# Patient Record
Sex: Male | Born: 1960
Health system: Southern US, Community
[De-identification: ages and names within clinical notes are randomized; demographics above are authoritative.]

## PROBLEM LIST (undated history)

## (undated) DIAGNOSIS — M1990 Unspecified osteoarthritis, unspecified site: Secondary | ICD-10-CM

## (undated) DIAGNOSIS — Z8673 Personal history of transient ischemic attack (TIA), and cerebral infarction without residual deficits: Secondary | ICD-10-CM

## (undated) DIAGNOSIS — I10 Essential (primary) hypertension: Secondary | ICD-10-CM

## (undated) DIAGNOSIS — E669 Obesity, unspecified: Secondary | ICD-10-CM

## (undated) DIAGNOSIS — H269 Unspecified cataract: Secondary | ICD-10-CM

## (undated) DIAGNOSIS — K5790 Diverticulosis of intestine, part unspecified, without perforation or abscess without bleeding: Secondary | ICD-10-CM

## (undated) DIAGNOSIS — E785 Hyperlipidemia, unspecified: Secondary | ICD-10-CM

## (undated) DIAGNOSIS — I509 Heart failure, unspecified: Secondary | ICD-10-CM

## (undated) HISTORY — DX: Essential (primary) hypertension: I10

## (undated) HISTORY — DX: Heart failure, unspecified: I50.9

## (undated) HISTORY — DX: Diverticulosis of intestine, part unspecified, without perforation or abscess without bleeding: K57.90

## (undated) HISTORY — DX: Unspecified osteoarthritis, unspecified site: M19.90

## (undated) HISTORY — DX: Obesity, unspecified: E66.9

## (undated) HISTORY — DX: Unspecified cataract: H26.9

## (undated) HISTORY — DX: Personal history of transient ischemic attack (TIA), and cerebral infarction without residual deficits: Z86.73

## (undated) HISTORY — DX: Hyperlipidemia, unspecified: E78.5

---

## 1998-04-07 HISTORY — PX: KNEE SURGERY: SHX244

## 1998-05-19 ENCOUNTER — Emergency Department (HOSPITAL_COMMUNITY): Admission: EM | Admit: 1998-05-19 | Discharge: 1998-05-19 | Payer: Self-pay | Admitting: Emergency Medicine

## 1998-09-15 ENCOUNTER — Emergency Department (HOSPITAL_COMMUNITY): Admission: EM | Admit: 1998-09-15 | Discharge: 1998-09-15 | Payer: Self-pay

## 1999-01-12 ENCOUNTER — Emergency Department (HOSPITAL_COMMUNITY): Admission: EM | Admit: 1999-01-12 | Discharge: 1999-01-12 | Payer: Self-pay | Admitting: Emergency Medicine

## 1999-03-02 ENCOUNTER — Emergency Department (HOSPITAL_COMMUNITY): Admission: EM | Admit: 1999-03-02 | Discharge: 1999-03-03 | Payer: Self-pay | Admitting: *Deleted

## 1999-05-09 ENCOUNTER — Emergency Department (HOSPITAL_COMMUNITY): Admission: EM | Admit: 1999-05-09 | Discharge: 1999-05-09 | Payer: Self-pay | Admitting: Emergency Medicine

## 1999-06-15 ENCOUNTER — Emergency Department (HOSPITAL_COMMUNITY): Admission: EM | Admit: 1999-06-15 | Discharge: 1999-06-15 | Payer: Self-pay | Admitting: *Deleted

## 1999-12-04 ENCOUNTER — Encounter: Admission: RE | Admit: 1999-12-04 | Discharge: 1999-12-04 | Payer: Self-pay | Admitting: Nephrology

## 1999-12-04 ENCOUNTER — Encounter: Payer: Self-pay | Admitting: Nephrology

## 1999-12-18 ENCOUNTER — Encounter: Payer: Self-pay | Admitting: Specialist

## 1999-12-18 ENCOUNTER — Encounter: Admission: RE | Admit: 1999-12-18 | Discharge: 1999-12-18 | Payer: Self-pay | Admitting: Specialist

## 2000-05-14 ENCOUNTER — Emergency Department (HOSPITAL_COMMUNITY): Admission: EM | Admit: 2000-05-14 | Discharge: 2000-05-14 | Payer: Self-pay | Admitting: Emergency Medicine

## 2000-07-07 ENCOUNTER — Emergency Department (HOSPITAL_COMMUNITY): Admission: EM | Admit: 2000-07-07 | Discharge: 2000-07-07 | Payer: Self-pay | Admitting: Emergency Medicine

## 2000-07-29 ENCOUNTER — Emergency Department (HOSPITAL_COMMUNITY): Admission: EM | Admit: 2000-07-29 | Discharge: 2000-07-29 | Payer: Self-pay | Admitting: Internal Medicine

## 2000-08-28 ENCOUNTER — Emergency Department (HOSPITAL_COMMUNITY): Admission: EM | Admit: 2000-08-28 | Discharge: 2000-08-28 | Payer: Self-pay | Admitting: Emergency Medicine

## 2000-10-29 ENCOUNTER — Encounter: Payer: Self-pay | Admitting: Emergency Medicine

## 2000-10-29 ENCOUNTER — Inpatient Hospital Stay (HOSPITAL_COMMUNITY): Admission: AD | Admit: 2000-10-29 | Discharge: 2000-10-31 | Payer: Self-pay | Admitting: Nephrology

## 2000-10-31 ENCOUNTER — Encounter: Payer: Self-pay | Admitting: Nephrology

## 2003-01-27 ENCOUNTER — Encounter: Admission: RE | Admit: 2003-01-27 | Discharge: 2003-01-27 | Payer: Self-pay | Admitting: Nephrology

## 2003-01-27 ENCOUNTER — Encounter: Payer: Self-pay | Admitting: Nephrology

## 2003-04-09 ENCOUNTER — Emergency Department (HOSPITAL_COMMUNITY): Admission: AD | Admit: 2003-04-09 | Discharge: 2003-04-09 | Payer: Self-pay | Admitting: Family Medicine

## 2003-06-10 ENCOUNTER — Emergency Department (HOSPITAL_COMMUNITY): Admission: AD | Admit: 2003-06-10 | Discharge: 2003-06-10 | Payer: Self-pay | Admitting: Family Medicine

## 2003-07-21 ENCOUNTER — Encounter: Admission: RE | Admit: 2003-07-21 | Discharge: 2003-07-21 | Payer: Self-pay | Admitting: Nephrology

## 2003-10-19 ENCOUNTER — Observation Stay (HOSPITAL_COMMUNITY): Admission: EM | Admit: 2003-10-19 | Discharge: 2003-10-20 | Payer: Self-pay | Admitting: Emergency Medicine

## 2003-10-24 ENCOUNTER — Ambulatory Visit (HOSPITAL_COMMUNITY): Admission: RE | Admit: 2003-10-24 | Discharge: 2003-10-24 | Payer: Self-pay | Admitting: Cardiology

## 2003-10-24 HISTORY — PX: CARDIAC CATHETERIZATION: SHX172

## 2003-10-30 ENCOUNTER — Emergency Department (HOSPITAL_COMMUNITY): Admission: EM | Admit: 2003-10-30 | Discharge: 2003-10-30 | Payer: Self-pay | Admitting: Family Medicine

## 2003-10-30 ENCOUNTER — Emergency Department (HOSPITAL_COMMUNITY): Admission: EM | Admit: 2003-10-30 | Discharge: 2003-10-30 | Payer: Self-pay | Admitting: Emergency Medicine

## 2004-05-05 ENCOUNTER — Emergency Department (HOSPITAL_COMMUNITY): Admission: EM | Admit: 2004-05-05 | Discharge: 2004-05-05 | Payer: Self-pay | Admitting: Emergency Medicine

## 2004-05-15 ENCOUNTER — Emergency Department (HOSPITAL_COMMUNITY): Admission: EM | Admit: 2004-05-15 | Discharge: 2004-05-15 | Payer: Self-pay | Admitting: Emergency Medicine

## 2004-10-04 ENCOUNTER — Ambulatory Visit: Payer: Self-pay | Admitting: Internal Medicine

## 2004-10-29 ENCOUNTER — Inpatient Hospital Stay (HOSPITAL_COMMUNITY): Admission: EM | Admit: 2004-10-29 | Discharge: 2004-11-01 | Payer: Self-pay | Admitting: Emergency Medicine

## 2005-01-26 ENCOUNTER — Inpatient Hospital Stay (HOSPITAL_COMMUNITY): Admission: EM | Admit: 2005-01-26 | Discharge: 2005-01-30 | Payer: Self-pay | Admitting: Emergency Medicine

## 2005-01-27 ENCOUNTER — Encounter (INDEPENDENT_AMBULATORY_CARE_PROVIDER_SITE_OTHER): Payer: Self-pay | Admitting: Cardiovascular Disease

## 2005-01-30 ENCOUNTER — Ambulatory Visit: Payer: Self-pay | Admitting: Internal Medicine

## 2006-04-07 HISTORY — PX: CARDIAC DEFIBRILLATOR PLACEMENT: SHX171

## 2007-01-31 ENCOUNTER — Ambulatory Visit: Payer: Self-pay | Admitting: Internal Medicine

## 2007-01-31 ENCOUNTER — Inpatient Hospital Stay (HOSPITAL_COMMUNITY): Admission: EM | Admit: 2007-01-31 | Discharge: 2007-02-05 | Payer: Self-pay | Admitting: Emergency Medicine

## 2007-02-02 ENCOUNTER — Encounter (INDEPENDENT_AMBULATORY_CARE_PROVIDER_SITE_OTHER): Payer: Self-pay | Admitting: Cardiovascular Disease

## 2007-02-12 ENCOUNTER — Ambulatory Visit: Payer: Self-pay | Admitting: Family Medicine

## 2007-02-12 ENCOUNTER — Other Ambulatory Visit: Payer: Self-pay | Admitting: Internal Medicine

## 2007-02-12 ENCOUNTER — Inpatient Hospital Stay (HOSPITAL_COMMUNITY): Admission: EM | Admit: 2007-02-12 | Discharge: 2007-02-15 | Payer: Self-pay | Admitting: Emergency Medicine

## 2007-02-12 ENCOUNTER — Ambulatory Visit: Payer: Self-pay | Admitting: Internal Medicine

## 2007-02-18 ENCOUNTER — Ambulatory Visit: Payer: Self-pay

## 2007-02-18 ENCOUNTER — Encounter (INDEPENDENT_AMBULATORY_CARE_PROVIDER_SITE_OTHER): Payer: Self-pay | Admitting: Family Medicine

## 2007-02-18 DIAGNOSIS — N183 Chronic kidney disease, stage 3 unspecified: Secondary | ICD-10-CM

## 2007-02-18 DIAGNOSIS — N1831 Chronic kidney disease, stage 3a: Secondary | ICD-10-CM | POA: Insufficient documentation

## 2007-02-18 DIAGNOSIS — E875 Hyperkalemia: Secondary | ICD-10-CM | POA: Insufficient documentation

## 2007-02-18 DIAGNOSIS — I10 Essential (primary) hypertension: Secondary | ICD-10-CM

## 2007-02-18 DIAGNOSIS — I5022 Chronic systolic (congestive) heart failure: Secondary | ICD-10-CM

## 2007-02-18 HISTORY — DX: Chronic systolic (congestive) heart failure: I50.22

## 2007-02-18 HISTORY — DX: Essential (primary) hypertension: I10

## 2007-02-18 HISTORY — DX: Chronic kidney disease, stage 3 unspecified: N18.30

## 2007-02-18 LAB — CONVERTED CEMR LAB
BUN: 33 mg/dL — ABNORMAL HIGH (ref 6–23)
CO2: 23 meq/L (ref 19–32)
Calcium: 9.7 mg/dL (ref 8.4–10.5)
Chloride: 107 meq/L (ref 96–112)
Creatinine, Ser: 1.64 mg/dL — ABNORMAL HIGH (ref 0.40–1.50)
Glucose, Bld: 116 mg/dL — ABNORMAL HIGH (ref 70–99)
Potassium: 5 meq/L (ref 3.5–5.3)
Sodium: 143 meq/L (ref 135–145)

## 2007-02-19 ENCOUNTER — Ambulatory Visit: Payer: Self-pay | Admitting: Internal Medicine

## 2007-02-19 ENCOUNTER — Observation Stay (HOSPITAL_COMMUNITY): Admission: RE | Admit: 2007-02-19 | Discharge: 2007-02-20 | Payer: Self-pay | Admitting: Internal Medicine

## 2007-03-08 ENCOUNTER — Ambulatory Visit: Payer: Self-pay

## 2007-03-11 ENCOUNTER — Telehealth (INDEPENDENT_AMBULATORY_CARE_PROVIDER_SITE_OTHER): Payer: Self-pay | Admitting: Family Medicine

## 2007-03-29 ENCOUNTER — Telehealth: Payer: Self-pay | Admitting: *Deleted

## 2007-04-02 ENCOUNTER — Telehealth: Payer: Self-pay | Admitting: *Deleted

## 2007-04-14 ENCOUNTER — Ambulatory Visit: Payer: Self-pay | Admitting: Family Medicine

## 2007-04-14 LAB — CONVERTED CEMR LAB
ALT: 21 units/L (ref 0–53)
AST: 15 units/L (ref 0–37)
Albumin: 4.3 g/dL (ref 3.5–5.2)
Alkaline Phosphatase: 71 units/L (ref 39–117)
BUN: 15 mg/dL (ref 6–23)
CO2: 25 meq/L (ref 19–32)
Calcium: 9.1 mg/dL (ref 8.4–10.5)
Chloride: 107 meq/L (ref 96–112)
Cholesterol: 121 mg/dL (ref 0–200)
Creatinine, Ser: 1.18 mg/dL (ref 0.40–1.50)
Glucose, Bld: 117 mg/dL — ABNORMAL HIGH (ref 70–99)
HDL: 31 mg/dL — ABNORMAL LOW (ref >39)
LDL Cholesterol: 75 mg/dL (ref 0–99)
Potassium: 4.3 meq/L (ref 3.5–5.3)
Pro B Natriuretic peptide (BNP): 70 pg/mL (ref 0.0–100.0)
Sodium: 144 meq/L (ref 135–145)
Total Bilirubin: 0.8 mg/dL (ref 0.3–1.2)
Total CHOL/HDL Ratio: 3.9
Total Protein: 7.2 g/dL (ref 6.0–8.3)
Triglycerides: 76 mg/dL (ref <150)
VLDL: 15 mg/dL (ref 0–40)

## 2007-04-22 ENCOUNTER — Encounter: Payer: Self-pay | Admitting: Family Medicine

## 2007-04-22 ENCOUNTER — Telehealth: Payer: Self-pay | Admitting: *Deleted

## 2007-05-14 ENCOUNTER — Ambulatory Visit: Payer: Self-pay | Admitting: Internal Medicine

## 2007-06-16 ENCOUNTER — Ambulatory Visit: Payer: Self-pay | Admitting: Family Medicine

## 2007-06-16 LAB — CONVERTED CEMR LAB
ALT: 18 units/L (ref 0–53)
AST: 14 units/L (ref 0–37)
Albumin: 4.2 g/dL (ref 3.5–5.2)
Alkaline Phosphatase: 69 units/L (ref 39–117)
BUN: 14 mg/dL (ref 6–23)
CO2: 26 meq/L (ref 19–32)
Calcium: 9.1 mg/dL (ref 8.4–10.5)
Chloride: 110 meq/L (ref 96–112)
Creatinine, Ser: 1.04 mg/dL (ref 0.40–1.50)
Glucose, Bld: 122 mg/dL — ABNORMAL HIGH (ref 70–99)
Potassium: 4.5 meq/L (ref 3.5–5.3)
Pro B Natriuretic peptide (BNP): 45 pg/mL (ref 0.0–100.0)
Sodium: 144 meq/L (ref 135–145)
Total Bilirubin: 0.7 mg/dL (ref 0.3–1.2)
Total Protein: 7.2 g/dL (ref 6.0–8.3)

## 2007-06-17 ENCOUNTER — Encounter: Payer: Self-pay | Admitting: Family Medicine

## 2007-06-30 ENCOUNTER — Ambulatory Visit: Payer: Self-pay | Admitting: Family Medicine

## 2007-06-30 LAB — CONVERTED CEMR LAB
BUN: 13 mg/dL (ref 6–23)
CO2: 27 meq/L (ref 19–32)
Calcium: 9.1 mg/dL (ref 8.4–10.5)
Chloride: 106 meq/L (ref 96–112)
Creatinine, Ser: 1.13 mg/dL (ref 0.40–1.50)
Glucose, Bld: 125 mg/dL — ABNORMAL HIGH (ref 70–99)
Potassium: 4.1 meq/L (ref 3.5–5.3)
Sodium: 144 meq/L (ref 135–145)

## 2007-07-05 ENCOUNTER — Encounter: Payer: Self-pay | Admitting: Family Medicine

## 2007-08-12 ENCOUNTER — Ambulatory Visit: Payer: Self-pay

## 2007-08-25 ENCOUNTER — Ambulatory Visit: Payer: Self-pay | Admitting: Family Medicine

## 2007-08-25 DIAGNOSIS — E118 Type 2 diabetes mellitus with unspecified complications: Secondary | ICD-10-CM

## 2007-08-25 DIAGNOSIS — E1165 Type 2 diabetes mellitus with hyperglycemia: Secondary | ICD-10-CM

## 2007-08-25 DIAGNOSIS — N184 Chronic kidney disease, stage 4 (severe): Secondary | ICD-10-CM

## 2007-08-25 DIAGNOSIS — E1122 Type 2 diabetes mellitus with diabetic chronic kidney disease: Secondary | ICD-10-CM

## 2007-08-25 HISTORY — DX: Type 2 diabetes mellitus with diabetic chronic kidney disease: E11.22

## 2007-08-25 HISTORY — DX: Chronic kidney disease, stage 4 (severe): N18.4

## 2007-08-26 ENCOUNTER — Encounter: Payer: Self-pay | Admitting: Family Medicine

## 2007-09-13 ENCOUNTER — Telehealth: Payer: Self-pay | Admitting: *Deleted

## 2007-11-09 ENCOUNTER — Telehealth: Payer: Self-pay | Admitting: *Deleted

## 2007-11-11 ENCOUNTER — Ambulatory Visit: Payer: Self-pay

## 2007-11-17 ENCOUNTER — Ambulatory Visit: Payer: Self-pay | Admitting: Family Medicine

## 2007-12-10 ENCOUNTER — Encounter: Payer: Self-pay | Admitting: Family Medicine

## 2007-12-10 ENCOUNTER — Ambulatory Visit: Payer: Self-pay | Admitting: Family Medicine

## 2007-12-10 LAB — CONVERTED CEMR LAB
ALT: 14 units/L (ref 0–53)
AST: 13 units/L (ref 0–37)
Albumin: 4.3 g/dL (ref 3.5–5.2)
Alkaline Phosphatase: 66 units/L (ref 39–117)
BUN: 17 mg/dL (ref 6–23)
CO2: 26 meq/L (ref 19–32)
Calcium: 8.8 mg/dL (ref 8.4–10.5)
Chloride: 108 meq/L (ref 96–112)
Creatinine, Ser: 1.22 mg/dL (ref 0.40–1.50)
Glucose, Bld: 129 mg/dL — ABNORMAL HIGH (ref 70–99)
Hgb A1c MFr Bld: 6.6 %
Potassium: 3.9 meq/L (ref 3.5–5.3)
Sodium: 144 meq/L (ref 135–145)
Total Bilirubin: 0.5 mg/dL (ref 0.3–1.2)
Total Protein: 7.1 g/dL (ref 6.0–8.3)

## 2007-12-14 ENCOUNTER — Encounter: Payer: Self-pay | Admitting: Family Medicine

## 2008-01-10 ENCOUNTER — Ambulatory Visit: Payer: Self-pay | Admitting: Family Medicine

## 2008-01-12 ENCOUNTER — Telehealth (INDEPENDENT_AMBULATORY_CARE_PROVIDER_SITE_OTHER): Payer: Self-pay | Admitting: *Deleted

## 2008-01-20 ENCOUNTER — Telehealth: Payer: Self-pay | Admitting: *Deleted

## 2008-01-21 ENCOUNTER — Encounter: Admission: RE | Admit: 2008-01-21 | Discharge: 2008-01-21 | Payer: Self-pay | Admitting: Family Medicine

## 2008-01-21 ENCOUNTER — Ambulatory Visit: Payer: Self-pay | Admitting: Family Medicine

## 2008-02-08 ENCOUNTER — Ambulatory Visit: Payer: Self-pay | Admitting: Internal Medicine

## 2008-03-08 ENCOUNTER — Ambulatory Visit: Payer: Self-pay | Admitting: Family Medicine

## 2008-03-08 LAB — CONVERTED CEMR LAB
ALT: 13 units/L (ref 0–53)
AST: 12 units/L (ref 0–37)
Albumin: 4.4 g/dL (ref 3.5–5.2)
Alkaline Phosphatase: 68 units/L (ref 39–117)
BUN: 16 mg/dL (ref 6–23)
CO2: 28 meq/L (ref 19–32)
Calcium: 9.3 mg/dL (ref 8.4–10.5)
Chloride: 106 meq/L (ref 96–112)
Creatinine, Ser: 1.2 mg/dL (ref 0.40–1.50)
Glucose, Bld: 105 mg/dL — ABNORMAL HIGH (ref 70–99)
Hgb A1c MFr Bld: 6.6 %
Potassium: 4 meq/L (ref 3.5–5.3)
Sodium: 145 meq/L (ref 135–145)
Total Bilirubin: 0.6 mg/dL (ref 0.3–1.2)
Total Protein: 6.9 g/dL (ref 6.0–8.3)

## 2008-03-17 ENCOUNTER — Encounter: Payer: Self-pay | Admitting: Family Medicine

## 2008-05-10 ENCOUNTER — Encounter (INDEPENDENT_AMBULATORY_CARE_PROVIDER_SITE_OTHER): Payer: Self-pay | Admitting: *Deleted

## 2008-05-10 ENCOUNTER — Ambulatory Visit: Payer: Self-pay | Admitting: Internal Medicine

## 2008-05-30 IMAGING — CR DG CHEST 2V
3 series · 3 of 3 positions shown · non-contrast
Comparison: 02/12/07.

CLINICAL DATA: Cardiac arrhythmia and status-post insertion of pacemaker. 
 PORTABLE CHEST - 1 VIEW:

[w chest pa]
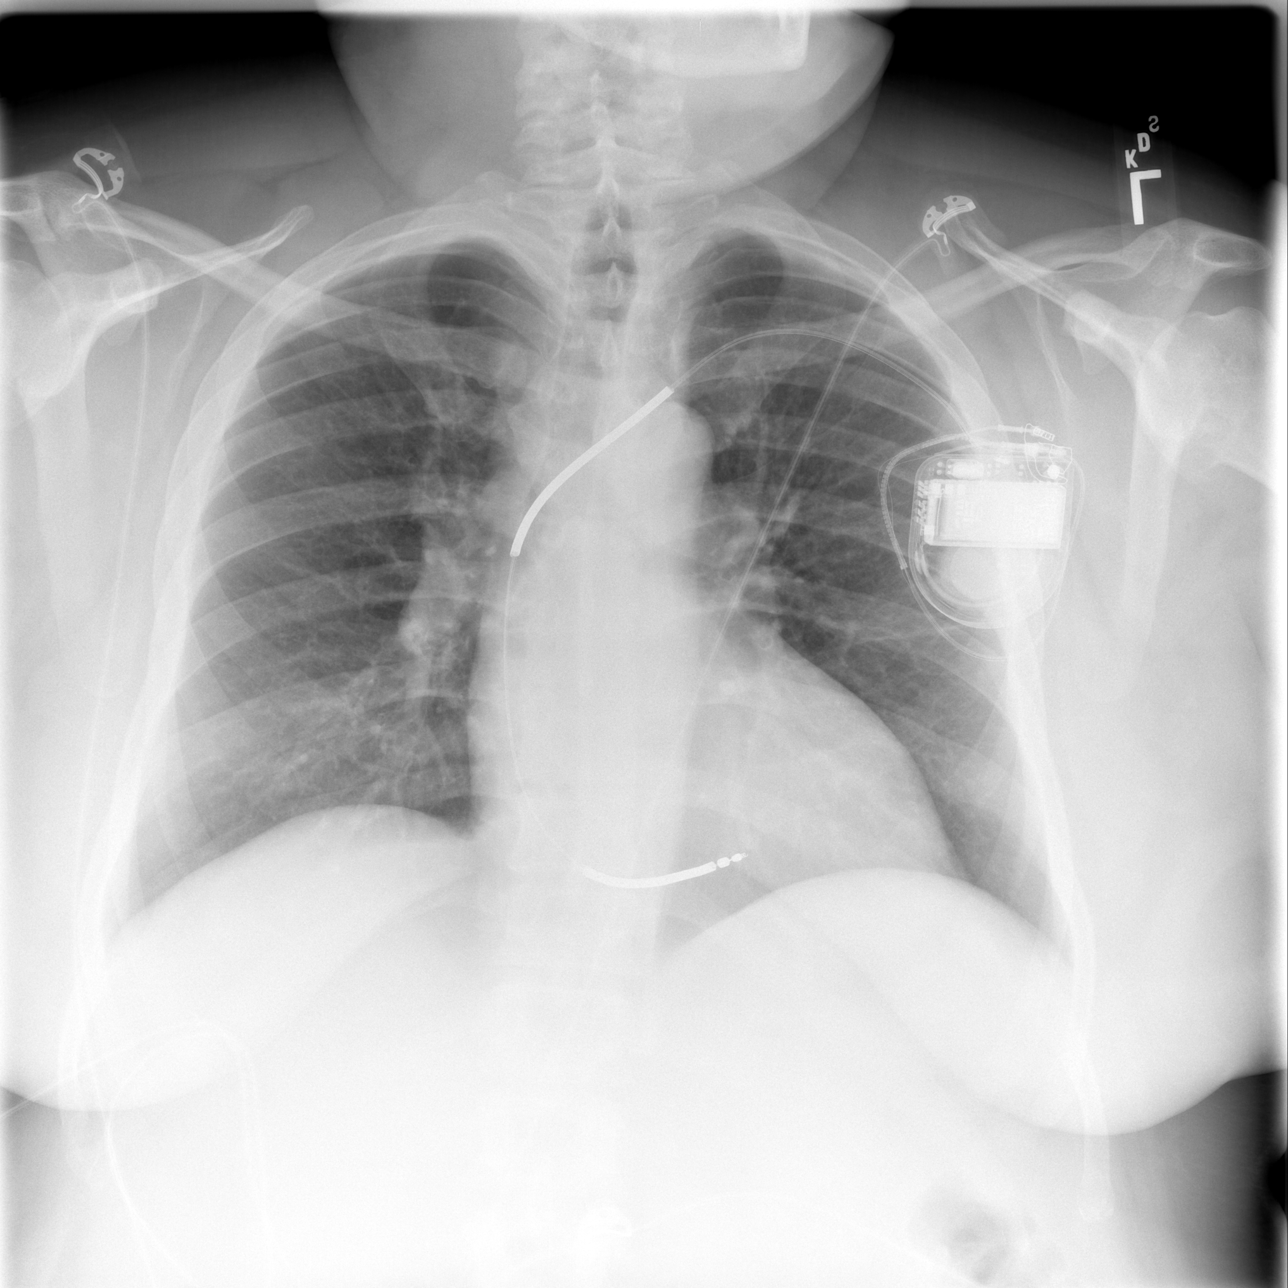

[w chest lat]
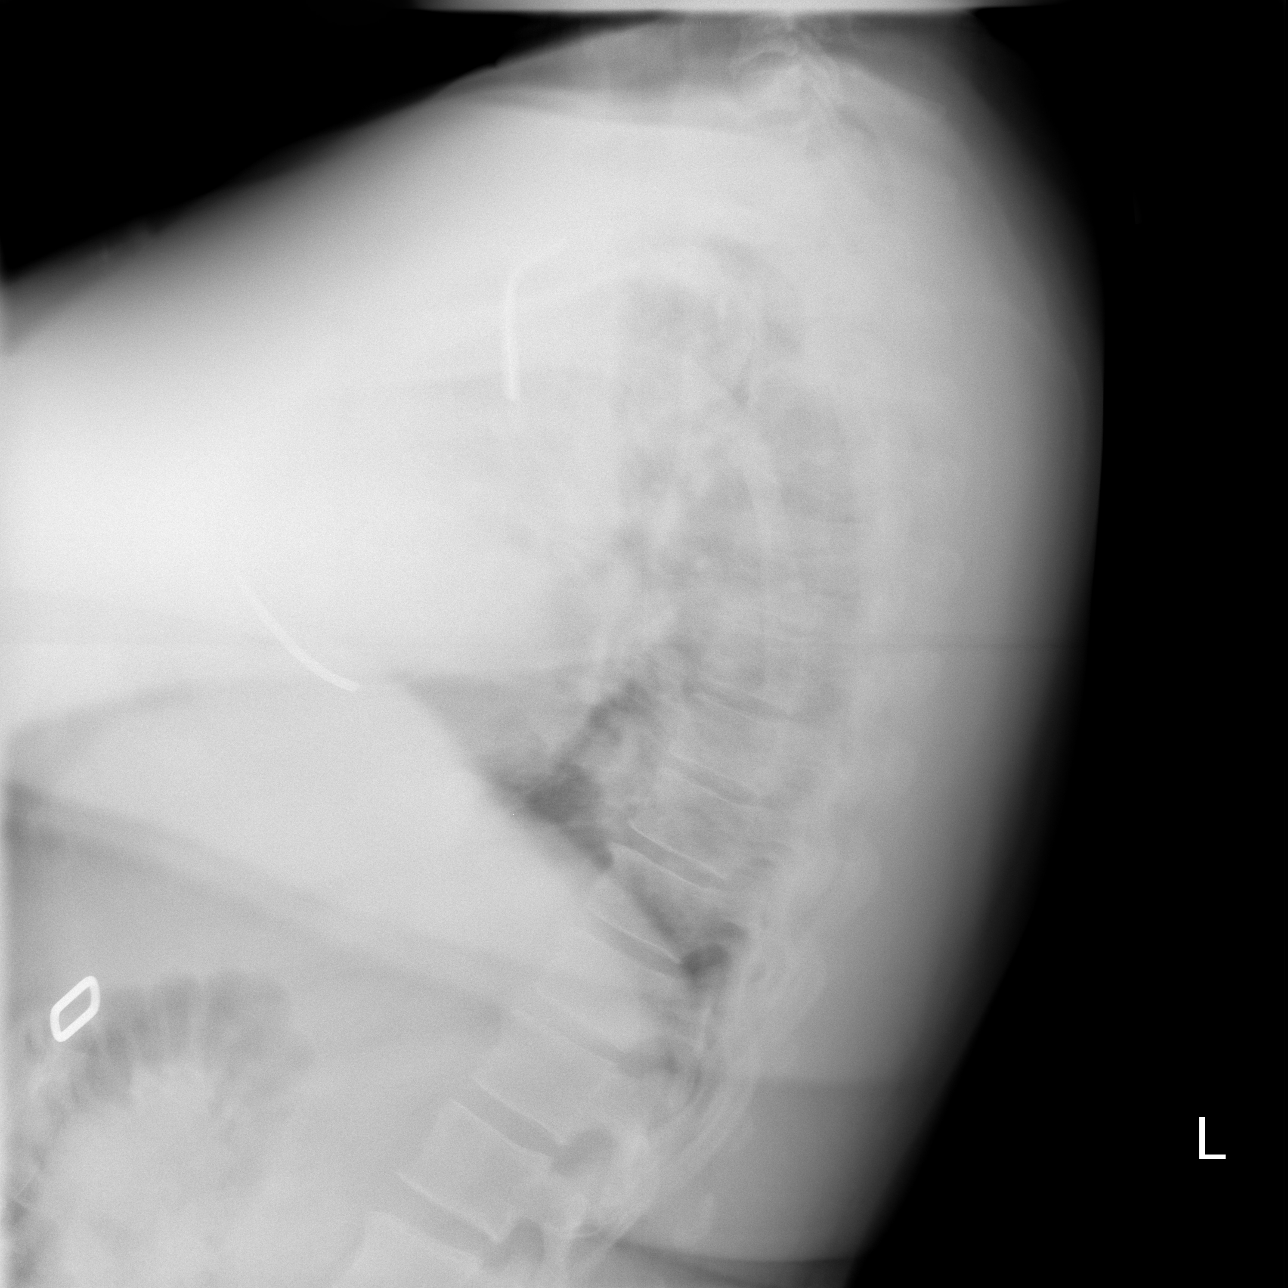

[w chest lat *]
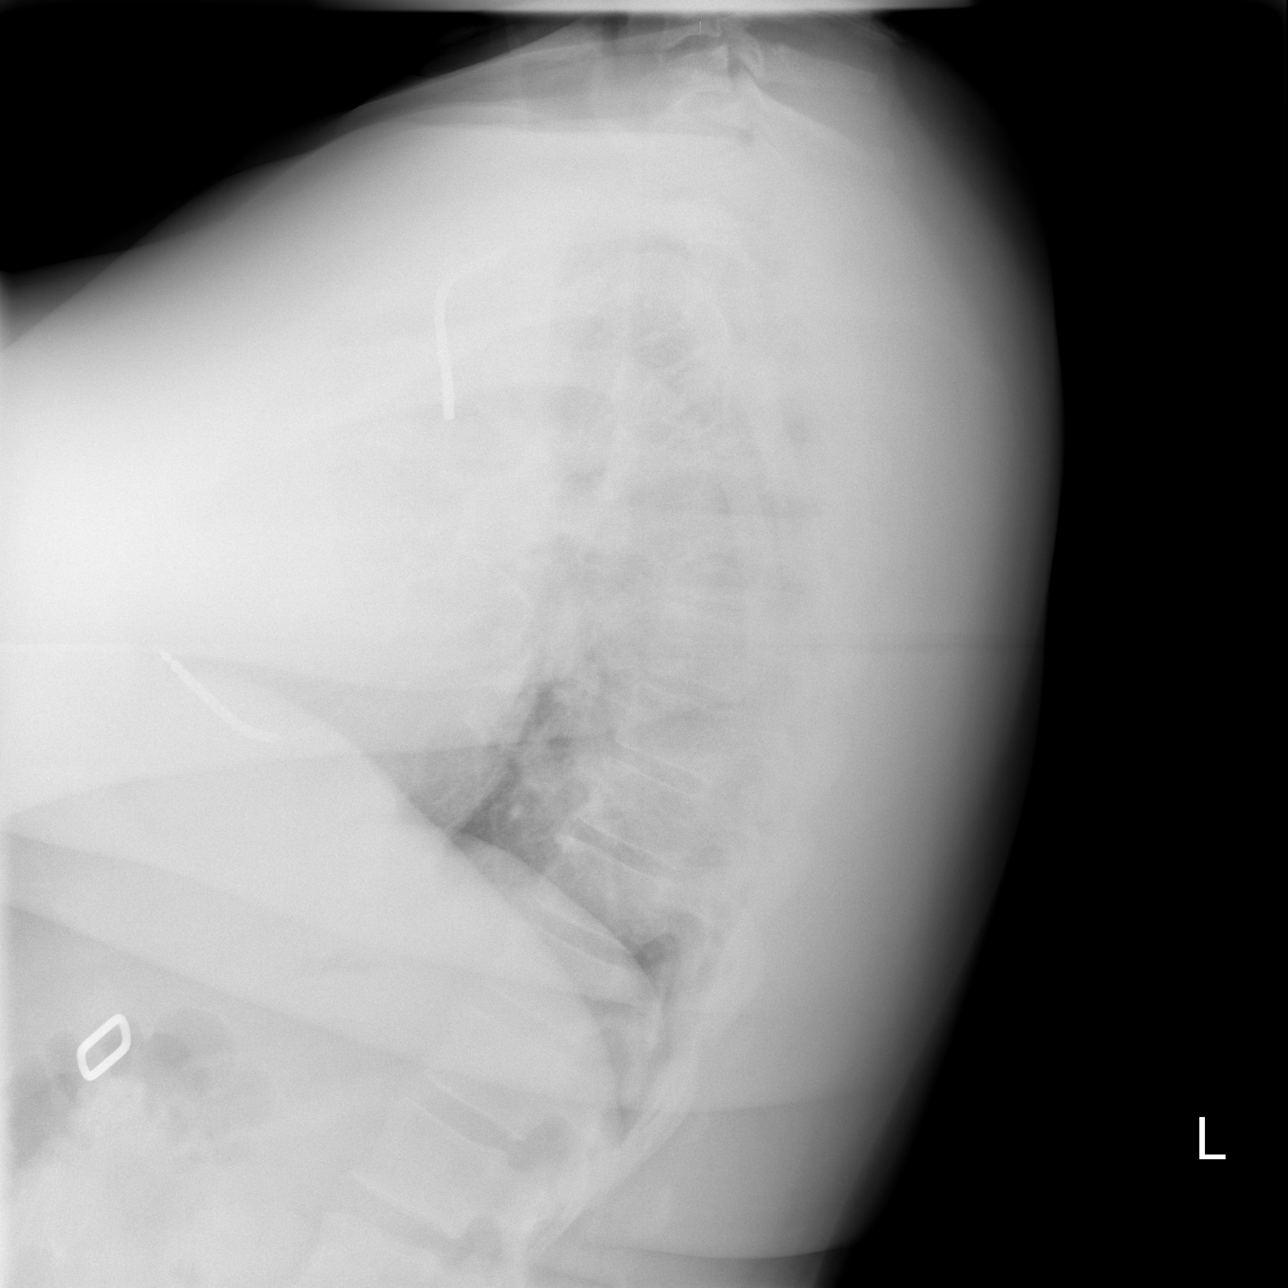

[3 of 3 positions shown; findings below may reference images not displayed]

FINDINGS: The patient is status-post insertion of a left subclavian pacing/ICD device, which appears appropriately positioned. No pneumothorax is seen after the procedure.  No evidence of edema or pleural fluid. Cardiac and mediastinal contours are within normal limits.
IMPRESSION: No acute findings after implanted pacemaker placement.

## 2008-06-07 ENCOUNTER — Ambulatory Visit: Payer: Self-pay | Admitting: Family Medicine

## 2008-06-09 ENCOUNTER — Encounter: Payer: Self-pay | Admitting: Family Medicine

## 2008-06-09 LAB — CONVERTED CEMR LAB
ALT: 17 units/L (ref 0–53)
AST: 15 units/L (ref 0–37)
Albumin: 4.4 g/dL (ref 3.5–5.2)
Alkaline Phosphatase: 70 units/L (ref 39–117)
BUN: 14 mg/dL (ref 6–23)
CO2: 25 meq/L (ref 19–32)
Calcium: 8.6 mg/dL (ref 8.4–10.5)
Chloride: 108 meq/L (ref 96–112)
Creatinine, Ser: 1 mg/dL (ref 0.40–1.50)
Direct LDL: 96 mg/dL
Glucose, Bld: 121 mg/dL — ABNORMAL HIGH (ref 70–99)
Potassium: 3.8 meq/L (ref 3.5–5.3)
Pro B Natriuretic peptide (BNP): 19.9 pg/mL (ref 0.0–100.0)
Sodium: 145 meq/L (ref 135–145)
Total Bilirubin: 0.7 mg/dL (ref 0.3–1.2)
Total Protein: 7.3 g/dL (ref 6.0–8.3)

## 2008-08-10 ENCOUNTER — Ambulatory Visit: Payer: Self-pay

## 2008-09-08 ENCOUNTER — Telehealth: Payer: Self-pay | Admitting: *Deleted

## 2008-11-15 ENCOUNTER — Ambulatory Visit: Payer: Self-pay

## 2008-12-28 ENCOUNTER — Ambulatory Visit: Payer: Self-pay | Admitting: Family Medicine

## 2009-01-17 ENCOUNTER — Telehealth: Payer: Self-pay | Admitting: *Deleted

## 2009-01-24 ENCOUNTER — Encounter: Payer: Self-pay | Admitting: Family Medicine

## 2009-01-24 ENCOUNTER — Ambulatory Visit: Payer: Self-pay | Admitting: Family Medicine

## 2009-01-24 LAB — CONVERTED CEMR LAB
ALT: 22 units/L (ref 0–53)
AST: 17 units/L (ref 0–37)
Albumin: 4.1 g/dL (ref 3.5–5.2)
Alkaline Phosphatase: 87 units/L (ref 39–117)
BUN: 17 mg/dL (ref 6–23)
CO2: 24 meq/L (ref 19–32)
Calcium: 8.5 mg/dL (ref 8.4–10.5)
Chloride: 105 meq/L (ref 96–112)
Creatinine, Ser: 1.21 mg/dL (ref 0.40–1.50)
Direct LDL: 92 mg/dL
Glucose, Bld: 215 mg/dL — ABNORMAL HIGH (ref 70–99)
Hgb A1c MFr Bld: 7.2 %
Potassium: 3.2 meq/L — ABNORMAL LOW (ref 3.5–5.3)
Sodium: 144 meq/L (ref 135–145)
Total Bilirubin: 0.4 mg/dL (ref 0.3–1.2)
Total Protein: 7 g/dL (ref 6.0–8.3)

## 2009-01-29 ENCOUNTER — Encounter: Payer: Self-pay | Admitting: Family Medicine

## 2009-02-13 ENCOUNTER — Encounter (INDEPENDENT_AMBULATORY_CARE_PROVIDER_SITE_OTHER): Payer: Self-pay | Admitting: *Deleted

## 2009-02-13 ENCOUNTER — Ambulatory Visit: Payer: Self-pay | Admitting: Internal Medicine

## 2009-02-13 DIAGNOSIS — I428 Other cardiomyopathies: Secondary | ICD-10-CM | POA: Insufficient documentation

## 2009-02-27 ENCOUNTER — Encounter: Payer: Self-pay | Admitting: Internal Medicine

## 2009-03-16 ENCOUNTER — Telehealth: Payer: Self-pay | Admitting: Internal Medicine

## 2009-03-20 ENCOUNTER — Telehealth: Payer: Self-pay | Admitting: Internal Medicine

## 2009-03-27 ENCOUNTER — Telehealth (INDEPENDENT_AMBULATORY_CARE_PROVIDER_SITE_OTHER): Payer: Self-pay | Admitting: *Deleted

## 2009-03-27 DIAGNOSIS — G4733 Obstructive sleep apnea (adult) (pediatric): Secondary | ICD-10-CM

## 2009-03-27 HISTORY — DX: Obstructive sleep apnea (adult) (pediatric): G47.33

## 2009-04-05 ENCOUNTER — Telehealth (INDEPENDENT_AMBULATORY_CARE_PROVIDER_SITE_OTHER): Payer: Self-pay | Admitting: *Deleted

## 2009-04-23 ENCOUNTER — Ambulatory Visit: Payer: Self-pay | Admitting: Pulmonary Disease

## 2009-04-23 DIAGNOSIS — R351 Nocturia: Secondary | ICD-10-CM | POA: Insufficient documentation

## 2009-04-25 ENCOUNTER — Ambulatory Visit: Payer: Self-pay | Admitting: Family Medicine

## 2009-04-25 LAB — CONVERTED CEMR LAB
BUN: 12 mg/dL (ref 6–23)
CO2: 23 meq/L (ref 19–32)
Calcium: 8.3 mg/dL — ABNORMAL LOW (ref 8.4–10.5)
Chloride: 106 meq/L (ref 96–112)
Creatinine, Ser: 1.05 mg/dL (ref 0.40–1.50)
Glucose, Bld: 171 mg/dL — ABNORMAL HIGH (ref 70–99)
Potassium: 3.4 meq/L — ABNORMAL LOW (ref 3.5–5.3)
Sodium: 143 meq/L (ref 135–145)

## 2009-04-26 ENCOUNTER — Encounter: Payer: Self-pay | Admitting: Family Medicine

## 2009-05-24 ENCOUNTER — Ambulatory Visit: Payer: Self-pay | Admitting: Family Medicine

## 2009-05-24 ENCOUNTER — Encounter: Payer: Self-pay | Admitting: Family Medicine

## 2009-05-24 LAB — CONVERTED CEMR LAB
BUN: 14 mg/dL (ref 6–23)
CO2: 22 meq/L (ref 19–32)
Calcium: 8.4 mg/dL (ref 8.4–10.5)
Chloride: 107 meq/L (ref 96–112)
Creatinine, Ser: 1.3 mg/dL (ref 0.40–1.50)
Glucose, Bld: 144 mg/dL — ABNORMAL HIGH (ref 70–99)
Potassium: 4 meq/L (ref 3.5–5.3)
Sodium: 141 meq/L (ref 135–145)

## 2009-05-25 ENCOUNTER — Encounter: Payer: Self-pay | Admitting: Family Medicine

## 2009-05-25 ENCOUNTER — Ambulatory Visit (HOSPITAL_BASED_OUTPATIENT_CLINIC_OR_DEPARTMENT_OTHER): Admission: RE | Admit: 2009-05-25 | Discharge: 2009-05-25 | Payer: Self-pay | Admitting: Pulmonary Disease

## 2009-05-25 ENCOUNTER — Ambulatory Visit: Payer: Self-pay | Admitting: Pulmonary Disease

## 2009-06-04 ENCOUNTER — Encounter: Payer: Self-pay | Admitting: Pulmonary Disease

## 2009-06-26 ENCOUNTER — Encounter: Payer: Self-pay | Admitting: Pulmonary Disease

## 2009-08-01 ENCOUNTER — Ambulatory Visit: Payer: Self-pay | Admitting: Family Medicine

## 2009-08-01 DIAGNOSIS — M25519 Pain in unspecified shoulder: Secondary | ICD-10-CM

## 2009-08-01 HISTORY — DX: Pain in unspecified shoulder: M25.519

## 2009-08-01 LAB — CONVERTED CEMR LAB
ALT: 14 units/L (ref 0–53)
AST: 14 units/L (ref 0–37)
Albumin: 4.3 g/dL (ref 3.5–5.2)
Alkaline Phosphatase: 74 units/L (ref 39–117)
BUN: 15 mg/dL (ref 6–23)
CO2: 26 meq/L (ref 19–32)
Calcium: 9 mg/dL (ref 8.4–10.5)
Chloride: 106 meq/L (ref 96–112)
Creatinine, Ser: 1.23 mg/dL (ref 0.40–1.50)
Direct LDL: 78 mg/dL
Glucose, Bld: 134 mg/dL — ABNORMAL HIGH (ref 70–99)
PSA: 0.16 ng/mL (ref 0.10–4.00)
Potassium: 4.2 meq/L (ref 3.5–5.3)
Sodium: 143 meq/L (ref 135–145)
Total Bilirubin: 0.5 mg/dL (ref 0.3–1.2)
Total Protein: 7.1 g/dL (ref 6.0–8.3)

## 2009-08-03 ENCOUNTER — Encounter: Payer: Self-pay | Admitting: Family Medicine

## 2009-08-07 ENCOUNTER — Telehealth: Payer: Self-pay | Admitting: *Deleted

## 2009-08-07 ENCOUNTER — Telehealth: Payer: Self-pay | Admitting: Pulmonary Disease

## 2009-08-14 ENCOUNTER — Telehealth (INDEPENDENT_AMBULATORY_CARE_PROVIDER_SITE_OTHER): Payer: Self-pay | Admitting: *Deleted

## 2009-08-14 ENCOUNTER — Ambulatory Visit: Payer: Self-pay | Admitting: Pulmonary Disease

## 2009-08-15 ENCOUNTER — Telehealth: Payer: Self-pay | Admitting: *Deleted

## 2009-08-27 ENCOUNTER — Ambulatory Visit: Payer: Self-pay | Admitting: Family Medicine

## 2009-10-17 ENCOUNTER — Ambulatory Visit: Payer: Self-pay | Admitting: Family Medicine

## 2009-10-17 LAB — CONVERTED CEMR LAB
ALT: 15 units/L (ref 0–53)
AST: 14 units/L (ref 0–37)
Albumin: 4.4 g/dL (ref 3.5–5.2)
Alkaline Phosphatase: 64 units/L (ref 39–117)
BUN: 18 mg/dL (ref 6–23)
CO2: 26 meq/L (ref 19–32)
Calcium: 9.8 mg/dL (ref 8.4–10.5)
Chloride: 105 meq/L (ref 96–112)
Creatinine, Ser: 1.37 mg/dL (ref 0.40–1.50)
Glucose, Bld: 144 mg/dL — ABNORMAL HIGH (ref 70–99)
Hgb A1c MFr Bld: 6.9 %
Potassium: 4.7 meq/L (ref 3.5–5.3)
Pro B Natriuretic peptide (BNP): 18.6 pg/mL (ref 0.0–100.0)
Sodium: 142 meq/L (ref 135–145)
Total Bilirubin: 0.5 mg/dL (ref 0.3–1.2)
Total Protein: 7.5 g/dL (ref 6.0–8.3)

## 2009-10-28 ENCOUNTER — Encounter: Payer: Self-pay | Admitting: Family Medicine

## 2009-10-29 ENCOUNTER — Encounter (INDEPENDENT_AMBULATORY_CARE_PROVIDER_SITE_OTHER): Payer: Self-pay | Admitting: *Deleted

## 2009-12-04 ENCOUNTER — Ambulatory Visit: Payer: Self-pay | Admitting: Internal Medicine

## 2010-01-09 ENCOUNTER — Ambulatory Visit: Payer: Self-pay | Admitting: Family Medicine

## 2010-01-09 LAB — CONVERTED CEMR LAB
BUN: 12 mg/dL (ref 6–23)
CO2: 26 meq/L (ref 19–32)
Calcium: 8.9 mg/dL (ref 8.4–10.5)
Chloride: 108 meq/L (ref 96–112)
Creatinine, Ser: 1.25 mg/dL (ref 0.40–1.50)
Glucose, Bld: 171 mg/dL — ABNORMAL HIGH (ref 70–99)
Hgb A1c MFr Bld: 7.4 %
Potassium: 4.1 meq/L (ref 3.5–5.3)
Sodium: 142 meq/L (ref 135–145)

## 2010-01-11 ENCOUNTER — Encounter: Payer: Self-pay | Admitting: Family Medicine

## 2010-01-14 ENCOUNTER — Encounter: Payer: Self-pay | Admitting: Family Medicine

## 2010-02-21 ENCOUNTER — Ambulatory Visit: Payer: Self-pay

## 2010-03-06 ENCOUNTER — Encounter: Payer: Self-pay | Admitting: Family Medicine

## 2010-03-06 ENCOUNTER — Ambulatory Visit: Payer: Self-pay | Admitting: Family Medicine

## 2010-03-06 LAB — CONVERTED CEMR LAB
ALT: 18 units/L (ref 0–53)
AST: 12 units/L (ref 0–37)
Albumin: 4.5 g/dL (ref 3.5–5.2)
Alkaline Phosphatase: 65 units/L (ref 39–117)
BUN: 25 mg/dL — ABNORMAL HIGH (ref 6–23)
CO2: 26 meq/L (ref 19–32)
Calcium: 9.4 mg/dL (ref 8.4–10.5)
Chloride: 104 meq/L (ref 96–112)
Creatinine, Ser: 1.51 mg/dL — ABNORMAL HIGH (ref 0.40–1.50)
Glucose, Bld: 127 mg/dL — ABNORMAL HIGH (ref 70–99)
Hgb A1c MFr Bld: 7.3 %
Potassium: 4.5 meq/L (ref 3.5–5.3)
Sodium: 142 meq/L (ref 135–145)
Total Bilirubin: 0.6 mg/dL (ref 0.3–1.2)
Total Protein: 7.2 g/dL (ref 6.0–8.3)

## 2010-03-11 ENCOUNTER — Telehealth: Payer: Self-pay | Admitting: *Deleted

## 2010-03-15 ENCOUNTER — Encounter: Payer: Self-pay | Admitting: Family Medicine

## 2010-03-22 ENCOUNTER — Telehealth: Payer: Self-pay | Admitting: *Deleted

## 2010-03-25 ENCOUNTER — Encounter: Payer: Self-pay | Admitting: Family Medicine

## 2010-03-25 ENCOUNTER — Telehealth: Payer: Self-pay | Admitting: *Deleted

## 2010-04-03 ENCOUNTER — Ambulatory Visit: Payer: Self-pay

## 2010-04-05 ENCOUNTER — Ambulatory Visit
Admission: RE | Admit: 2010-04-05 | Discharge: 2010-04-05 | Payer: Self-pay | Source: Home / Self Care | Attending: Family Medicine | Admitting: Family Medicine

## 2010-04-05 ENCOUNTER — Encounter: Payer: Self-pay | Admitting: Family Medicine

## 2010-04-05 LAB — CONVERTED CEMR LAB
BUN: 19 mg/dL (ref 6–23)
CO2: 27 meq/L (ref 19–32)
Calcium: 9 mg/dL (ref 8.4–10.5)
Chloride: 105 meq/L (ref 96–112)
Creatinine, Ser: 1.43 mg/dL (ref 0.40–1.50)
Glucose, Bld: 131 mg/dL — ABNORMAL HIGH (ref 70–99)
Potassium: 4.5 meq/L (ref 3.5–5.3)
Sodium: 144 meq/L (ref 135–145)

## 2010-04-07 HISTORY — PX: CATARACT EXTRACTION: SUR2

## 2010-04-07 HISTORY — PX: SHOULDER SURGERY: SHX246

## 2010-04-09 ENCOUNTER — Encounter: Payer: Self-pay | Admitting: Family Medicine

## 2010-04-09 ENCOUNTER — Telehealth: Payer: Self-pay | Admitting: Internal Medicine

## 2010-05-09 NOTE — Assessment & Plan Note (Signed)
Summary: fu wp   Vital Signs:  Patient Profile:   50 Years Old Male Weight:      283 pounds Temp:     98.5 degrees F Pulse rate:   59 / minute BP sitting:   134 / 80  Vitals Entered By: Christen Bame CMA (June 16, 2007 8:42 AM)                 Chief Complaint:  f/u BP.  History of Present Illness: last 3 weeks has had decrease in exercise capacity---cannot walk up hill in front of his house now without stopping due to SOB. he also is getting increased LE edema every evening. Feels like he did when he started retaining water weight gain prior to ;ast hospitalization but not that bad. No chest pains. saw his cardiologist--Dr harvani--who continued to increase his lisinopril. [name] not happy with this becaus e last time he ended up with renal failure at higher doses of ACE.He is thinking about stopping seeing Dr Glennon Mac and just continuing to see Dr Caryl Comes (electrophysiology heart doctor) and [place].  Taking all of his meds regularly---trying to continue walking daily--said his 40 yo son noticed  that he was having more DOE.    Past Medical History:    Reviewed history from 04/14/2007 and no changes required:       non-obstructive cardiomyopathy EF 20%-30%, normal coronaries from Cath 2006;  ; ECHO 01/2007 EF 25% -30%;        prolonged QT        DM 2       OSA       HTN       Obesity       renal insufficiency--worsens w ACE  Past Surgical History:    implantation AICD 03/2007 (Dr Caryl Comes)      Physical Exam  General:     alert and well-developed.  obese. no JVD Eyes:     pupils equal and pupils round.  non icteric Neck:     supple, full ROM, and no masses.  no bruits Lungs:     normal breath sounds and no wheezes.   Heart:     normal rate, regular rhythm, and no murmur.   Abdomen:     obese soft + bs. no masses but exam limited by habitus Msk:     normal joint mobility and extremity strength B = Extremities:     trace left pedal edema.   Neurologic:  alert & oriented X3 and gait normal.      Impression & Recommendations:  Problem # 1:  CHRONIC SYSTOLIC HEART FAILURE (123456) Dr Glennon Mac had increased his lisiopril to 10 two times a day--we discussed and decided to decrease to 10 once daily. Add small dose lasix. check kidney function today and when he rtc in 2 weeks. I agree with Dr Aquilla Hacker note that we cannot push his ACE too high. Continue walking program. we discussed his care at length spending > 505 45 minute ov in education and counseling regarding his precarious heart condition. His updated medication list for this problem includes:    Coreg 25 Mg Tabs (Carvedilol) ..... One tab by mouth bid    Lisinopril 10 Mg Tabs (Lisinopril) ..... Once daily    Aspirin 325 Mg Tabs (Aspirin) .Marland Kitchen... 1 qd    Furosemide 20 Mg Tabs (Furosemide) .Marland Kitchen... 1 by mouth once daily  Orders: Comp Met-[facility] [phone]) B Nat Peptide-[facility] [phone]) [facility]- Est  Level 4 [id number])  Problem # 2:  CHRONIC KIDNEY DISEASE UNSPECIFIED (ICD-585.9)  Orders: Comp [facility] [id number]) [facility]- Est  Level 4 [id number])   Complete Medication List: 1)  Coreg 25 Mg Tabs (Carvedilol) .... One tab by mouth bid 2)  Lisinopril 10 Mg Tabs (Lisinopril) .... Once daily 3)  Hydralazine Hcl 50 Mg Tabs (Hydralazine hcl) .... Hold 4)  Norvasc 10 Mg Tabs (Amlodipine besylate) .Marland Kitchen.. 1 by mouth once daily 5)  Pravastatin Sodium 80 Mg Tabs (Pravastatin sodium) .... One tab by mouth qhs 6)  Aspirin 325 Mg Tabs (Aspirin) .Marland Kitchen.. 1 qd 7)  Isosorbide Dinitrate 20 Mg Tabs (Isosorbide dinitrate) .... Hold 8)  Furosemide 20 Mg Tabs (Furosemide) .Marland Kitchen.. 1 by mouth once daily   Patient Instructions: 1)  decrease your lisinopril from twice a day to once a day 2)  I am calling in a new medicine called lasix (also called furosemide) to [facility]. Please start taking one a day 3)  call me if you need to be seen sooner and we will work you in! 4)  plese schedule Adam Askar to see me in 2 weeks--Ok to  dbl book    Prescriptions: FUROSEMIDE 20 MG  TABS (FUROSEMIDE) 1 by mouth once daily  #30 x 12   Entered and Authorized by:   [name] MD   Signed by:   [name] MD on 06/16/2007   Method used:   Electronically sent to ...       [facility], [place]  [address]       Ph: [phone]       Fax: [phone]   RxID:   [id number]  ]

## 2010-05-09 NOTE — Assessment & Plan Note (Signed)
Summary: FLU SHOT/KH  Nurse Visit    Prior Medications: COREG 25 MG TABS (CARVEDILOL) one tab by mouth bid LISINOPRIL 10 MG  TABS (LISINOPRIL) once daily HYDRALAZINE HCL 50 MG  TABS (HYDRALAZINE HCL) three times a day NORVASC 10 MG  TABS (AMLODIPINE BESYLATE) 1 by mouth once daily PRAVASTATIN SODIUM 80 MG  TABS (PRAVASTATIN SODIUM) one tab by mouth qhs ASPIRIN 325 MG TABS (ASPIRIN) 1 qd ISOSORBIDE DINITRATE 20 MG  TABS (ISOSORBIDE DINITRATE) tid FUROSEMIDE 20 MG  TABS (FUROSEMIDE) 1 by mouth once daily EPIPEN 0.3 MG/0.3ML (1:1000)  DEVI (EPINEPHRINE HCL (ANAPHYLAXIS)) use as directed for shortness of breath or dizziness associated with bee or wasp sting    Influenza Vaccine    Vaccine Type: Fluvax Non-MCR    Site: left deltoid    Mfr: GlaxoSmithKline    Dose: 0.5 ml    Route: IM    Given by: Elray Mcgregor RN    Exp. Date: 10/04/2008    Lot #: [id number]    VIS given: 10/29/06 version given January 10, 2008.  Flu Vaccine Consent Questions    Do you have a history of severe allergic reactions to this vaccine? no    Any prior history of allergic reactions to egg and/or gelatin? no    Do you have a sensitivity to the preservative Thimersol? no    Do you have a past history of Guillan-Barre Syndrome? no    Do you currently have an acute febrile illness? no    Have you ever had a severe reaction to latex? no    Vaccine information given and explained to patient? yes   Orders Added: 1)  Influenza Vaccine NON MCR [00028] 2)  Est Level 1- [facility] [id number]    ]

## 2010-05-09 NOTE — Letter (Signed)
Summary: LAB Letter  [name]  [address]   [place], [place] [address]   Phone: [phone]  Fax: [phone]    10/28/2009  [name] [address] [place], [place]  [address]  Dear [name],  YOur labs all looked good. Regarding your blood sugar: you continue to be slightly elevated--the random test showed 144. Normal is about 100-125. So you are just slightly elevatd. We also did a test looking at [place] blood sugar control--the A1C test. THe goal is less than 7.0 and you were 6.9.   I agree with Dr [name] that you have elevated sugars and that we need to keep a close eye on them. At tis time you do NOT need any diabetic medications.  Great to see you!         Sincerely,   Adam Mcmurray MD  Appended Document: LAB Letter faxed copy of note letter and labs to Dr [name]  Appended Document: LAB Letter mailed

## 2010-05-09 NOTE — Miscellaneous (Signed)
   Clinical Lists Changes to follow creatinine and K Orders: Added new Test order of Basic Met-[facility] [id number]) - Signed      Complete Medication List: 1)  Coreg 25 Mg Tabs (Carvedilol) .... One tab by mouth bid 2)  Hydralazine Hcl 50 Mg Tabs (Hydralazine hcl) .... Three times a day 3)  Norvasc 10 Mg Tabs (Amlodipine besylate) .Marland Kitchen.. 1 by mouth once daily 4)  Simvastatin 80 Mg Tabs (Simvastatin) .Marland Kitchen.. 1 by mouth once daily 5)  Aspirin 325 Mg Tabs (Aspirin) .Marland Kitchen.. 1 qd 6)  Isosorbide Dinitrate 20 Mg Tabs (Isosorbide dinitrate) .... Tid 7)  Furosemide 20 Mg Tabs (Furosemide) .... 2 by mouth two times a day 8)  Epipen 0.3 Mg/0.79ml (1:1000) Devi (Epinephrine hcl (anaphylaxis)) .... Use as directed for shortness of breath or dizziness associated with bee or wasp sting 9)  Cozaar 50 Mg Tabs (Losartan potassium) .... Take one tablet daily 10)  Aldactone 25 Mg Tabs (Spironolactone) .Marland Kitchen.. 1 by mouth once daily per dr Glennon Mac

## 2010-05-09 NOTE — Letter (Signed)
Summary: LAB Letter  [name]  [address]   [place], [place] [address]   Phone: [phone]  Fax: [phone]    01/29/2009  [name] [address] [place], [place]  [address]  Dear [name],  Your A1C looks great at 7.2! Your kidney and liver function and electrolytes were all normal. Your LDL cholesterol is 92. Ideally some would say we should get it less than 70, but I think 92 is pretty good. We can discuss further which number you would rather shoot for when I see you back in a few months.         Sincerely,   [name] MD  Appended Document: LAB Letter mailed.

## 2010-05-09 NOTE — Assessment & Plan Note (Signed)
Summary: f/up,tcb   Vital Signs:  Patient profile:   50 year old male Height:      67 inches Weight:      310.9 pounds BMI:     48.87 Temp:     97.7 degrees F oral Pulse rate:   81 / minute BP sitting:   111 / 76  (left arm) Cuff size:   large  Vitals Entered By: Isabelle Course (October 17, 2009 8:33 AM) CC: F/U Is Patient Diabetic? No Pain Assessment Patient in pain? no        Primary Care Provider:  [name] MD  CC:  F/U.  History of Present Illness: Follow up hypertension. Taking medicines regularly with no problems. Not having any any headaches or chest pains.  Elevated blood sugar at a recent docfotr's o42ffice (cardiology) visit. he does not know what it was.  denies increased thirst or urination. No increased fatigue or blurry vision.  Feels like he is doing well Left shoulder maybe a little better after I saw him last but now notes pain more in anterior part of shoulder where previously it was diffuse. pain [id number], worse with certain movements, Does not radiate. Now armth or erythemna of joint.  Saw his opthalmologist as he is continuing to lose peripheral vision in right eye--they have been following his left eye pressure and it evidently increasing. He changed (added) some eye drops and has f/u 2 weeks. Is likely to have right eye laser repeat surgery soon.  Habits & Providers  Alcohol-Tobacco-Diet     Tobacco Status: never  Current Medications (verified): 1)  Coreg 25 Mg Tabs (Carvedilol) .... One Tab By Mouth Bid 2)  Hydralazine Hcl 50 Mg  Tabs (Hydralazine Hcl) .... Three Times A Day 3)  Norvasc 10 Mg  Tabs (Amlodipine Besylate) .Marland Kitchen.. 1 By Mouth Once Daily 4)  Simvastatin 80 Mg Tabs (Simvastatin) .Marland Kitchen.. 1 By Mouth Once Daily 5)  Aspirin 325 Mg Tabs (Aspirin) .Marland Kitchen.. 1 Qd 6)  Isosorbide Dinitrate 20 Mg  Tabs (Isosorbide Dinitrate) .... Tid 7)  Furosemide 20 Mg  Tabs (Furosemide) .... 2 By Mouth Two Times A Day 8)  Epipen 0.3 Mg/0.74ml (1:1000)  Devi (Epinephrine Hcl  (Anaphylaxis)) .... Use As Directed For Shortness of Breath or Dizziness Associated With Bee or Wasp Sting 9)  Cozaar 50 Mg Tabs (Losartan Potassium) .... Take One Tablet Daily 10)  Aldactone 25 Mg Tabs (Spironolactone) .Marland Kitchen.. 1 By Mouth Once Daily Per Dr Glennon Mac  Allergies: No Known Drug Allergies  Past History:  Past Medical History: Last updated: 08/14/2009 non-obstructive cardiomyopathy EF 20%-30%, normal coronaries from Cath 2006;  ; ECHO 01/2007 EF 25% -30%;  prolonged QT  Glucose intolerance--pre diabetes. Obstructive sleep apnea      - AHI 40      - CPAP 18 cm H2O HTN Obesity renal insufficiency--worsens w ACE AICD-Medtronic Maximo 7232  Past Surgical History: Last updated: 04/25/2009 implantation AICD 03/2007 (Dr Klein)-[employer] Maximo [phone] Cardiologist [name] ( general cardiology) and Dr Caryl Comes (folllows AICD) Right knee surgery  Social History: Last updated: 04/23/2009 lives in [place] with 2 children. He is separated from his wife.  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 (stopped as of 8/09)  Review of Systems  The patient denies anorexia, fever, weight loss, weight gain, vision loss, chest pain, syncope, dyspnea  on exertion, prolonged cough, abdominal pain, melena, severe indigestion/heartburn, and muscle weakness.    Physical Exam  General:  alert, well-developed, and overweight-appearing.   Eyes:  pupils equal, pupils round, and pupils reactive to light.   Msk:  L shoulder TTP over bicipital tendon and this reproduces his pain. Rotator cuff strength is ijtact. Pain with IR . Some pain with abdiuction in lateral plane above shoulder height.    Impression & Recommendations:  Problem # 1:  SHOULDER PAIN, LEFT (ICD-719.41) less diffuse pain now with bicipital tendoniis. Orders: [facility]- Est  Level 4  [id number]) Injection, large joint- [place] ([address]) continue exercise program  Problem # 2:  HYPERTENSION, BENIGN (ICD-401.1)  His updated medication list for this problem includes:    Coreg 25 Mg Tabs (Carvedilol) ..... One tab by mouth bid    Hydralazine Hcl 50 Mg Tabs (Hydralazine hcl) .Marland Kitchen... Three times a day    Norvasc 10 Mg Tabs (Amlodipine besylate) .Marland Kitchen... 1 by mouth once daily    Furosemide 20 Mg Tabs (Furosemide) .Marland Kitchen... 2 by mouth two times a day    Cozaar 50 Mg Tabs (Losartan potassium) .Marland Kitchen... Take one tablet daily    Aldactone 25 Mg Tabs (Spironolactone) .Marland Kitchen... 1 by mouth once daily per dr Glennon Mac  Orders: [facility]- Est  Level 4 [id number]) check kidney function and will fax to Dr Glennon Mac  Problem # 5:  OTHER ABNORMAL GLUCOSE (ICD-790.29)  Orders: Comp Met[facility] [id number]) A1C-[facility] [id number]) recheck random blood sugar--he has been borderline elevated in past--we may need to add DM drug--I would not favor metformin given his hx or ARF/CRI.  He is not excited about insulin. F/u after labs.  Complete Medication List: 1)  Coreg 25 Mg Tabs (Carvedilol) .... One tab by mouth bid 2)  Hydralazine Hcl 50 Mg Tabs (Hydralazine hcl) .... Three times a day 3)  Norvasc 10 Mg Tabs (Amlodipine besylate) .Marland Kitchen.. 1 by mouth once daily 4)  Simvastatin 80 Mg Tabs (Simvastatin) .Marland Kitchen.. 1 by mouth once daily 5)  Aspirin 325 Mg Tabs (Aspirin) .Marland Kitchen.. 1 qd 6)  Isosorbide Dinitrate 20 Mg Tabs (Isosorbide dinitrate) .... Tid 7)  Furosemide 20 Mg Tabs (Furosemide) .... 2 by mouth two times a day 8)  Epipen 0.3 Mg/0.69ml (1:1000) Devi (Epinephrine hcl (anaphylaxis)) .... Use as directed for shortness of breath or dizziness associated with bee or wasp sting 9)  Cozaar 50 Mg Tabs (Losartan potassium) .... Take one tablet daily 10)  Aldactone 25 Mg Tabs (Spironolactone) .Marland Kitchen.. 1 by mouth once daily per dr Glennon Mac  Other Orders: B Nat Peptide[facility] [id number])  Laboratory Results   Blood Tests   Date/Time Received: October 17, 2009 9:03 AM  Date/Time Reported: October 17, 2009 9:44 AM   HGBA1C: 6.9%   (Normal Range: Non-Diabetic - 3-6%   Control Diabetic - 6-8%)  Comments: ...........test performed by...........Marland KitchenHedy Camara, CMA

## 2010-05-09 NOTE — Progress Notes (Signed)
   Mrs. Kalish  calling again regarding her husband's rx.  The pharmcy said they faxed it twice already for request,but haven't received yet.  She will call back around 4 to see if it's been sent.   Eusebio Friendly  September 08, 2008 2:09 PM  rx sent to pharmacy for  Hydralazine. Marcell Barlow RN  September 08, 2008 2:42 PM   notified patient. Marcell Barlow RN  September 08, 2008 4:59 PM

## 2010-05-09 NOTE — Assessment & Plan Note (Signed)
Summary: sleep apnea/ mbw   Copy to:  Dr. [name], Dr. Charolette Forward Primary Provider/Referring Provider:  [name] MD  CC:  Sleep consult. Epworth score is 8..  History of Present Illness: 50 yo male for evaluation of sleep apnea.  He is followed by cardiology for his cardiomyopathy.  During a recent evaluation concern was raised for possible sleep apnea.  He had a home sleep test which showed an AHI in the 40's with significant oxygen desaturation.  He had both obstructive as well as central apneic events.  Pulmonary/Sleep consultation was requested for further evaluation of this.  He goes to bed at 10pm.  He can sometimes take up to an hour to fall asleep.  He does not use anything to help sleep.  He wakes up several times during the night to use the bathroom.  His alarm goes off at 5am, but he does not get out of bed until 630am.  He feels okay in the morning, and denies morning headache.  He will sometimes fall asleep during the day if he is sitting quiet.  He has not notice any problems with driving.  He does snore, and has been told he stops breathing while asleep.  He has woken up with a choking sensation.  He has trouble sleeping on his back.  He has gained about 20 lbs since March 2010.  He denies sleep walking, sleep talking, nightmares, or bruxism.  He denies resltess leg syndrome.  There is no history of sleep hallucinations, sleep paralysis, or cataplexy.  He is not using anything to help him stay awake during the day.  Preventive Screening-Counseling & Management  Alcohol-Tobacco     Alcohol drinks/day: 0     Smoking Status: never  Current Medications (verified): 1)  Coreg 25 Mg Tabs (Carvedilol) .... One Tab By Mouth Bid 2)  Hydralazine Hcl 50 Mg  Tabs (Hydralazine Hcl) .... Three Times A Day 3)  Norvasc 10 Mg  Tabs (Amlodipine Besylate) .Marland Kitchen.. 1 By Mouth Once Daily 4)  Simvastatin 80 Mg Tabs (Simvastatin) .Marland Kitchen.. 1 By Mouth Once Daily 5)  Aspirin 325 Mg Tabs (Aspirin)  .Marland Kitchen.. 1 Qd 6)  Isosorbide Dinitrate 20 Mg  Tabs (Isosorbide Dinitrate) .... Tid 7)  Furosemide 20 Mg  Tabs (Furosemide) .... 2 By Mouth Once Daily 8)  Epipen 0.3 Mg/0.15ml (1:1000)  Devi (Epinephrine Hcl (Anaphylaxis)) .... Use As Directed For Shortness of Breath or Dizziness Associated With Bee or Wasp Sting 9)  Cozaar 50 Mg Tabs (Losartan Potassium) .... Take One Tablet Daily  Allergies (verified): No Known Drug Allergies  Past History:  Past Medical History: Last updated: 02/12/2009 non-obstructive cardiomyopathy EF 20%-30%, normal coronaries from Cath 2006;  ; ECHO 01/2007 EF 25% -30%;  prolonged QT  Glucose intolerance--pre diabetes. OSA HTN Obesity renal insufficiency--worsens w ACE AICD-Medtronic Maximo 7232  Past Surgical History: implantation AICD 03/2007 (Dr Klein)-[employer] Maximo [phone] Cardiologist [name] Right knee surgery  Family History: Reviewed history from 02/18/2007 and no changes required. mom: 27, living, Alzheimers, Type II DM father: deceased at 71, MI, CVA sister, deceased at 42 from cancer, heart diaseas sister 63, CHF, diabetes  Social History: lives in [place] with 2 children. He is separated from his wife.  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 (stopped as of 8/09)Alcohol drinks/day:  0  Review  of Systems       The patient complains of shortness of breath with activity and hand/feet swelling.    Vital Signs:  Patient profile:   50 year old male Height:      67 inches (170.18 cm) Weight:      317 pounds (144.09 kg) BMI:     49.83 O2 Sat:      92 % on Room air Temp:     98.0 degrees F (36.67 degrees C) oral Pulse rate:   83 / minute BP sitting:   126 / 82  (left arm) Cuff size:   large  Vitals Entered By: Francesca Jewett CMA (April 23, 2009 2:13 PM)  O2 Sat at Rest %:  92 O2 Flow:   Room air CC: Sleep consult. Epworth score is 8.   Physical Exam  General:  obese.   Eyes:  PERRLA and EOMI.   Nose:  no deformity, discharge, inflammation, or lesions Mouth:  MP 4, enlarged tongue, no oral lesions Neck:  no JVD.   Lungs:  diminished breath sounds, no wheezing or rales Heart:  normal rate, regular rhythm, and no murmur.   Abdomen:  obese, soft, non-tender Extremities:  minimal ankle edema, no cyanosis or clubbing Neurologic:  alert & oriented X3 and gait normal.   Cervical Nodes:  no significant adenopathy Psych:  alert and cooperative; normal mood and affect; normal attention span and concentration   Impression & Recommendations:  Problem # 1:  OBSTRUCTIVE SLEEP APNEA (ICD-327.23) He has a history of cardiomyopathy with an EF of 25% and hypertension.  His recent home sleep test showed severe sleep apnea with both obstructive and central apneic events.  I reviewed his sleep test results with him.  I explained how sleep apnea can affect his health.  Driving precautions and need for weight loss were reviewed.  Discussed treatment options for his sleep apnea.  Given the severity of his sleep apnea I have recommended CPAP therapy.  He will need to have an in-lab titration study to determine optimal pressure set up.  Given that he has a central apneic component as well, he may need to use either BPAP or adaptive servo-ventilator.  Problem # 2:  NOCTURIA [id number]) Explained to him that this could be related to his sleep apnea.  Also explained that the timing of when he takes his diuretics could be contributing to this as well.  Complete Medication List: 1)  Coreg 25 Mg Tabs (Carvedilol) .... One tab by mouth bid 2)  Hydralazine Hcl 50 Mg Tabs (Hydralazine hcl) .... Three times a day 3)  Norvasc 10 Mg Tabs (Amlodipine besylate) .Marland Kitchen.. 1 by mouth once daily 4)  Simvastatin 80 Mg Tabs (Simvastatin) .Marland Kitchen.. 1 by mouth once daily 5)  Aspirin 325 Mg Tabs (Aspirin) .Marland Kitchen.. 1 qd 6)   Isosorbide Dinitrate 20 Mg Tabs (Isosorbide dinitrate) .... Tid 7)  Furosemide 20 Mg Tabs (Furosemide) .... 2 by mouth once daily 8)  Epipen 0.3 Mg/0.51ml (1:1000) Devi (Epinephrine hcl (anaphylaxis)) .... Use as directed for shortness of breath or dizziness associated with bee or wasp sting 9)  Cozaar 50 Mg Tabs (Losartan potassium) .... Take one tablet daily  Other Orders: Sleep Disorder Referral (Sleep Disorder)  Patient Instructions: 1)  Will set up sleep study 2)  Will call to schedule follow up appointment after sleep study is ready  Appended Document: sleep apnea/ mbw     Clinical Lists Changes  Orders: Added new Service order of Consultation Level IV (  99244) - Signed

## 2010-05-09 NOTE — Progress Notes (Signed)
Summary: Question re: f/u appt  Phone Note Call from Patient Call back at Home Phone [phone]   Reason for Call: Talk to Doctor Summary of Call: pt is requesting to speak with someone about him scheduling an appt, he was suppose to f/u in 6 wks (Feb) but PCP is booked, would like to know what MD wants him to do? Initial call taken by: ERIN LEVAN,  April 22, 2007 2:48 PM  Follow-up for Phone Call        he can do first of March OR you can book him as last appt in [facility] on Tues if one is avail.  ...................................................................SARA NEAL MD  April 23, 2007 2:02 PM   Additional Follow-up for Phone Call Additional follow up Details #1::        he will call back mid feb to get appt in first week of march-schedule is not out yet. states he feels fine Additional Follow-up by: Elige Radon RN,  April 23, 2007 2:21 PM

## 2010-05-09 NOTE — Miscellaneous (Signed)
Summary: Device preload  Clinical Lists Changes  Observations: Added new observation of ICD INDICATN: ICM (05/10/2008 10:39) Added new observation of ICDLEADSTAT1: active (05/10/2008 10:39) Added new observation of ICDLEADSER1: [id number] V (05/10/2008 10:39) Added new observation of ICDLEADMOD1: [id number]  (05/10/2008 10:39) Added new observation of ICDLEADDOI1: 02/19/2007  (05/10/2008 10:39) Added new observation of ICDLEADLOC1: RV  (05/10/2008 10:39) Added new observation of ICD IMP MD: [name], MD  (05/10/2008 10:39) Added new observation of ICD IMPL DTE: 02/19/2007  (05/10/2008 10:39) Added new observation of ICD SERL#: [id number]  (05/10/2008 10:39) Added new observation of ICD MODL#: [id number]  (05/10/2008 10:39) Added new observation of ICDMANUFACTR: [employer]  (05/10/2008 10:39) Added new observation of CARDIO MD: [name], MD  (05/10/2008 10:39)      PPM Specifications Following MD:  [name], MD       ICD Specifications Following MD: [name], MD      ICD Vendor: Med[employer]onic     ICD Model Number: [id number]     ICD Serial Number: [id number]  ICD DOI: 02/19/2007     ICD Implanting MD: [name], MD  Lead 1:    Location: RV     DOI: 02/19/2007     Model #: [id number]     Serial #[id number]     Status: active  Indications::  ICM

## 2010-05-09 NOTE — Assessment & Plan Note (Signed)
Summary: F/U [facility]   Vital Signs:  Patient Profile:   50 Years Old Male Weight:      287 pounds Temp:     98.1 degrees F Pulse rate:   70 / minute BP sitting:   131 / 78  Vitals Entered By: Christen Bame CMA (Aug 25, 2007 9:28 AM)                 Chief Complaint:  f/u.  History of Present Illness: f/u cardiomyopathy, htn, recent elevated non fasting lood sugar. Doing well--walking almost every day--baseline SOb with hills but otherwise Ok--no chest pains, taking meds regularly w no problems  c/o L foot pain--seems numb on topof left foot--worse w prolonged standing or walking but not a lot worse. Has been going on several weeks--no specific injury. Worse idf his LE edema is worse  does have some LE edema by days end but resolves overnight  question about some skin tags on his neck        Physical Exam  General:     alert and overweight-appearing.   Neck:     supple, full ROM, and no masses.   Lungs:     normal respiratory effort and normal breath sounds.  no rales Heart:     normal rate, regular rhythm, no murmur, and no JVD.   Abdomen:     obese soft and non-tender.   Msk:     LE has no edema Skin:     several pedunculated skin tags r neck--mildly irritated. benign Additional Exam:     B feet mild cavus deformity. parasthesias on left foot dorsum with soft touch--normal sensation B feet otherwise. No skin lesions, no callous. normalcap refill, normal color and temp.    Impression & Recommendations:  Problem # 1:  CHRONIC SYSTOLIC HEART FAILURE (123456)  His updated medication list for this problem includes:    Coreg 25 Mg Tabs (Carvedilol) ..... One tab by mouth bid    Lisinopril 10 Mg Tabs (Lisinopril) ..... Once daily    Aspirin 325 Mg Tabs (Aspirin) .Marland Kitchen... 1 qd    Furosemide 20 Mg Tabs (Furosemide) .Marland Kitchen... 1 by mouth once daily seems to be doing very well on this regimen--no changes. check kidney function today. consinue walking--recommend  watching weight a little closer--he agrees.  BP well controlled and his symptoms well controlled.  Orders: [place]- Est  Level 4 ([address]214)   Problem # 2:  OTHER ABNORMAL GLUCOSE (ICD-790.29)  Orders: A1C-[facility] [id number]) [place]- Est  Level 4 ([address]214) recheck glucose and an A1C today  Problem # 3:  FOOT PAIN, LEFT (ICD-729.5) Assessment: New some mild neuropathy? o dorsum--will pad that area and add an arch band--he said itfelt immediately more comfortable w this. f/u next visit will also f/u skin tags whenever he decides to have them removed Orders: [facility]- Est  Level 4 ([address]214)   Complete Medication List: 1)  Coreg 25 Mg Tabs (Carvedilol) .... One tab by mouth bid 2)  Lisinopril 10 Mg Tabs (Lisinopril) .... Once daily 3)  Hydralazine Hcl 50 Mg Tabs (Hydralazine hcl) .... Three times a day 4)  Norvasc 10 Mg Tabs (Amlodipine besylate) .Marland Kitchen.. 1 by mouth once daily 5)  Pravastatin Sodium 80 Mg Tabs (Pravastatin sodium) .... One tab by mouth qhs 6)  Aspirin 325 Mg Tabs (Aspirin) .Marland Kitchen.. 1 qd 7)  Isosorbide Dinitrate 20 Mg Tabs (Isosorbide dinitrate) .... Hold 8)  Furosemide 20 Mg Tabs (Furosemide) .Marland Kitchen.. 1 by mouth once daily   Patient  Instructions: 1)  Please schedule a follow-up appointment in 3 months.   ]  Appended Document: A1c report    Lab Visit   Laboratory Results   Blood Tests   Date/Time Received: Aug 25, 2007 9:51 AM  Date/Time Reported: Aug 25, 2007 10:07 AM   HGBA1C: 6.7%   (Normal Range: Non-Diabetic - 3-6%   Control Diabetic - 6-8%)  Comments: ...............test performed by......Marland KitchenBonnie A. Martinique, [place] (ASCP)     Orders Today:

## 2010-05-09 NOTE — Assessment & Plan Note (Signed)
Summary: Persistant Cough   Vital Signs:  Patient profile:   50 year old male Height:      66 inches Weight:      315 pounds BMI:     51.03 O2 Sat:      94 % Temp:     99.2 degrees F Pulse rate:   84 / minute BP sitting:   154 / 88  (left arm) Cuff size:   large  Vitals Entered By: Elray Mcgregor RN (January 24, 2009 8:49 AM)  Serial Vital Signs/Assessments:  Time      Position  BP       Pulse  Resp  Temp     By                     135/80                         [name] MD  CC: Persistant cough Is Patient Diabetic? No Pain Assessment Patient in pain? no        Primary Care Provider:  [name] MD  CC:  Persistant cough.  History of Present Illness:  cough for 1-2 weeks. hinks it is related to allergies as he gets this every year in fall season. I had given him some cough syrup once that seemed to help as cough is aggravating most at night. Could no longer afford that. Anything else?  Saw Dr Glennon Mac and saw the electrophys lab for recent check ups. No problems w chest pains, no edema in extremities. Still walks some most days--said it is slow walk but up to [facility] or more. No problems w medicines. Will not need refills for 2 more months.  Does not want flu shot today--not sure he wants it at all this year as last year he got sick after shot.  Habits & Providers  Alcohol-Tobacco-Diet     Tobacco Status: never  Allergies: No Known Drug Allergies  Review of Systems  The patient denies fever, weight loss, weight gain, chest pain, syncope, dyspnea on exertion, peripheral edema, severe indigestion/heartburn, and difficulty walking.    Physical Exam  General:  overweight-appearing.   Neck:  supple, full ROM, no masses, and no JVD.   Lungs:  normal respiratory effort, normal breath sounds, and no wheezes.   Heart:  normal rate, regular rhythm, and no murmur.   Abdomen:  soft, non-tender, and normal bowel sounds.   Msk:  normal ROM.  symmetrical upper and lower  extremity strength Neurologic:  alert & oriented X3 and gait normal.     Impression & Recommendations:  Problem # 1:  COUGH (ICD-786.2)  will try some tylenol #3. lung exam clear. rtc if not improving  Orders: [place]- Est  Level 4 [id number])  Problem # 2:  OTHER ABNORMAL GLUCOSE (ICD-790.29)  Orders: Comp Met-[facility] [id number]) A1C-[facility] [id number]) [place]- Est  Level 4 [id number]) check labs today discussed continue regular exercise, increase if possible and decrease portion size.  Problem # 3:  HYPERTENSION, BENIGN (ICD-401.1)  His updated medication list for this problem includes:    Coreg 25 Mg Tabs (Carvedilol) ..... One tab by mouth bid    Lisinopril 10 Mg Tabs (Lisinopril) ..... Once daily    Hydralazine Hcl 50 Mg Tabs (Hydralazine hcl) .Marland Kitchen... Three times a day    Norvasc 10 Mg Tabs (Amlodipine besylate) .Marland Kitchen... 1 by mouth once daily    Furosemide 20 Mg Tabs (Furosemide) .Marland Kitchen... 1 by  mouth once daily  Orders: Comp Met-[facility] [id number]) Direct LDL-[facility] [id number]) [place]- Est  Level 4 [id number])  Complete Medication List: 1)  Coreg 25 Mg Tabs (Carvedilol) .... One tab by mouth bid 2)  Lisinopril 10 Mg Tabs (Lisinopril) .... Once daily 3)  Hydralazine Hcl 50 Mg Tabs (Hydralazine hcl) .... Three times a day 4)  Norvasc 10 Mg Tabs (Amlodipine besylate) .Marland Kitchen.. 1 by mouth once daily 5)  Simvastatin 80 Mg Tabs (Simvastatin) .Marland Kitchen.. 1 by mouth once daily 6)  Aspirin 325 Mg Tabs (Aspirin) .Marland Kitchen.. 1 qd 7)  Isosorbide Dinitrate 20 Mg Tabs (Isosorbide dinitrate) .... Tid 8)  Furosemide 20 Mg Tabs (Furosemide) .Marland Kitchen.. 1 by mouth once daily 9)  Epipen 0.3 Mg/0.66ml (1:1000) Devi (Epinephrine hcl (anaphylaxis)) .... Use as directed for shortness of breath or dizziness associated with bee or wasp sting 10)  Tussionex Pennkinetic Er 8-10 Mg/71ml Lqcr (Chlorpheniramine-hydrocodone) .... 5 cc two times a day by mouth as needed cough dispense 90 cc 11)  Tylenol With Codeine #3 300-30 Mg Tabs (Acetaminophen-codeine) .Marland Kitchen.. 1- 2  by mouth q 6 hrs prn  Patient Instructions: 1)  I will send you a note about your blood work and I will fax it to Dr Glennon Mac. 2)  Great to see you again. 3)  I should see you every three months to follow your labs and medical problems. 4)  call us when you are ready for a flu shot. 5)  Try the tylenol #3 for the cough. If it gets worse, comeback and see me Prescriptions: TYLENOL WITH CODEINE #3 300-30 MG TABS (ACETAMINOPHEN-CODEINE) 1- 2 by mouth q 6 hrs prn  #60 x 1   Entered and Authorized by:   [name] MD   Signed by:   [name] MD on 01/24/2009   Method used:   Print then Give to Patient   RxID:   [id number]   Laboratory Results   Blood Tests   Date/Time Received: January 24, 2009 9:22 AM  Date/Time Reported: January 24, 2009 10:06 AM   HGBA1C: 7.2%   (Normal Range: Non-Diabetic - 3-6%   Control Diabetic - 6-8%)  Comments: ...............test performed by......Marland KitchenBonnie A. Martinique, [place] (ASCP)     Prevention & Chronic Care Immunizations   Influenza vaccine: Fluvax Non-MCR  (01/10/2008)   Influenza vaccine deferral: Refused  (01/24/2009)    Tetanus booster: 11/17/2007: Tdap   Tetanus booster due: 11/16/2017    Pneumococcal vaccine: Not documented  Colorectal Screening   Hemoccult: Not documented    Colonoscopy: Not documented  Other Screening   PSA: Not documented   Smoking status: never  (01/24/2009)  Lipids   Total Cholesterol: 121  (04/14/2007)   LDL: 75  (04/14/2007)   LDL Direct: 96  (06/07/2008)   HDL: 31  (04/14/2007)   Triglycerides: 76  (04/14/2007)  Hypertension   Last Blood Pressure: 154 / 88  (01/24/2009)   Serum creatinine: 1.00  (06/07/2008)   Serum potassium 3.8  (06/07/2008) CMP ordered     Hypertension flowsheet reviewed?: Yes   Progress toward BP goal: Deteriorated  Self-Management Support :   Personal Goals (by the next clinic visit) :      Personal blood pressure goal: 140/90  (01/24/2009)   Patient will work on the  following items until the next clinic visit to reach self-care goals:     Medications and monitoring: take my medicines every day, weigh myself weekly  (01/24/2009)     Eating: eat baked foods instead of fried foods  (  01/24/2009)     Activity: take a 30 minute walk every day  (01/24/2009)    Hypertension self-management support: Written self-care plan  (01/24/2009)   Hypertension self-care plan printed.

## 2010-05-09 NOTE — Letter (Signed)
Summary: [facility]  [address]   [place], [place] [address]   Phone: [phone]  Fax: [phone]    04/22/2007  COURTLAN ABDULRAHMAN [address] [place], [place]  [address]  Dear Mr. GEHM,  Your lab work looks really good. Your blood sugar was up a small bit at 117--watch your intake of sweets. Your cholesterol panel looks great. Triglyceride              76 mg/dL                       HDL Cholesterol      [L]  31 mg/dL                       Total Chol/HDL Ratio      3.9 Ratio    LDL Cholesterol (Calc)                             75 mg/dL                    I want your LDL around 70 and it is 75. Great!         Sincerely,   [name] MD [facility]  Appended Document: LAB Letter Letter sent via mail ..................Marland KitchenDelores Pate-Gaddy, CMA (AAMA)

## 2010-05-09 NOTE — Assessment & Plan Note (Signed)
Summary: 11:30 f/u per Dr. Neal/bmc   Vital Signs:  Patient Profile:   50 Years Old Male Weight:      308 pounds Temp:     98.3 degrees F oral Pulse rate:   73 / minute BP sitting:   145 / 85  (left arm)  Vitals Entered By: Carolyne Littles (March 08, 2008 11:23 AM)             Is Patient Diabetic? Yes     Chief Complaint:  f/u.  History of Present Illness: Follow up hypertension. Taking medicines regularly with no problems. Not having any any headaches or chest pains. Saw both of his heart doctors (Dr Glennon Mac and the ICD doctor) and they changed nothing. No problems w meds. Needs some refills  Has NOT been exercising--has gained weight. Says he is a little SOB with exertion if he is "toting" something heavy and he does notice a little more lower extremity edema at night.  Follow-up hyperlipidemia. Trying to follow a good diet as far as low in fat, admits portions may be too bug,, taking medicines regularly. Not having any problems with medicines, no myalgias and no fatigue.      Current Allergies: No known allergies   Past Medical History:    Reviewed history from 04/14/2007 and no changes required:       non-obstructive cardiomyopathy EF 20%-30%, normal coronaries from Cath 2006;  ; ECHO 01/2007 EF 25% -30%;        prolonged QT        DM 2       OSA       HTN       Obesity       renal insufficiency--worsens w ACE  Past Surgical History:    Reviewed history from 06/16/2007 and no changes required:       implantation AICD 03/2007 (Dr Caryl Comes)     Review of Systems       The patient complains of weight gain and dyspnea on exertion.  The patient denies fever, chest pain, syncope, abdominal pain, difficulty walking, and depression.     Physical Exam  General:     overweight-appearing.   Neck:     supple, full ROM, and no masses.  no bruits Lungs:     normal respiratory effort, normal breath sounds, and no wheezes.   Heart:     normal rate, regular rhythm,  and no murmur.  heart sounds are somewhat distant (habitus) Abdomen:     non-tender and normal bowel sounds.  obese, not distended. no masses noted but exam limited by habitus Msk:     normal ROM, no joint tenderness, and no joint swelling.   Extremities:     1 + pitting edema B to shin (mid) Neurologic:     alert & oriented X3 and gait normal.      Impression & Recommendations:  Problem # 1:  HYPERTENSION, BENIGN (ICD-401.1) Assessment: Unchanged  His updated medication list for this problem includes:    Coreg 25 Mg Tabs (Carvedilol) ..... One tab by mouth bid    Lisinopril 10 Mg Tabs (Lisinopril) ..... Once daily    Hydralazine Hcl 50 Mg Tabs (Hydralazine hcl) .Marland Kitchen... Three times a day    Norvasc 10 Mg Tabs (Amlodipine besylate) .Marland Kitchen... 1 by mouth once daily    Furosemide 20 Mg Tabs (Furosemide) .Marland Kitchen... 1 by mouth once daily  Orders: Comp Met-[facility] [phone])   Problem # 2:  CHRONIC SYSTOLIC HEART  FAILURE (ICD-428.22)  His updated medication list for this problem includes:    Coreg 25 Mg Tabs (Carvedilol) ..... One tab by mouth bid    Lisinopril 10 Mg Tabs (Lisinopril) ..... Once daily    Aspirin 325 Mg Tabs (Aspirin) .Marland Kitchen... 1 qd    Furosemide 20 Mg Tabs (Furosemide) .Marland Kitchen... 1 by mouth once daily we discussed absolute need for weight loss--he is putting extra work on his heart at this weight  Problem # 3:  CHRONIC KIDNEY DISEASE UNSPECIFIED (ICD-585.9) Assessment: Unchanged  Orders: Comp Met-[facility] [id number]) check creatinine  Complete Medication List: 1)  Coreg 25 Mg Tabs (Carvedilol) .... One tab by mouth bid 2)  Lisinopril 10 Mg Tabs (Lisinopril) .... Once daily 3)  Hydralazine Hcl 50 Mg Tabs (Hydralazine hcl) .... Three times a day 4)  Norvasc 10 Mg Tabs (Amlodipine besylate) .Marland Kitchen.. 1 by mouth once daily 5)  Simvastatin 80 Mg Tabs (Simvastatin) .Marland Kitchen.. 1 by mouth once daily 6)  Aspirin 325 Mg Tabs (Aspirin) .Marland Kitchen.. 1 qd 7)  Isosorbide Dinitrate 20 Mg Tabs (Isosorbide  dinitrate) .... Tid 8)  Furosemide 20 Mg Tabs (Furosemide) .Marland Kitchen.. 1 by mouth once daily 9)  Epipen 0.3 Mg/0.47ml (1:1000) Devi (Epinephrine hcl (anaphylaxis)) .... Use as directed for shortness of breath or dizziness associated with bee or wasp sting 10)  Tussionex Pennkinetic Er 8-10 Mg/42ml Lqcr (Chlorpheniramine-hydrocodone) .... 5cc by mouth two times a day as needed cough - caution with sedation. dipense one bottle.  Other Orders: A1C-[facility] [id number])   Patient Instructions: 1)  Please schedule a follow-up appointment in 3 months. 2)  You are doing well but your weight is creeping up. You weighed 308 today, and you were 243 when I first started seeing you. Let's try walking every day, and our goal is down 15 pounds in 3 months. I KNOW you can do this! 3)  Hae a Happy Holiday!   Prescriptions: ISOSORBIDE DINITRATE 20 MG  TABS (ISOSORBIDE DINITRATE) tid  #270 x 3   Entered and Authorized by:   [name] MD   Signed by:   [name] MD on 03/08/2008   Method used:   Electronically to        [facility] (retail)       [address]       [place], [place]  [address]       Ph: [phone]       Fax: [phone]   RxID:   [id number] SIMVASTATIN 80 MG TABS (SIMVASTATIN) 1 by mouth once daily  #90 x 3   Entered and Authorized by:   [name] MD   Signed by:   [name] MD on 03/08/2008   Method used:   Electronically to        [facility] (retail)       [address]       [place], [place]  [address]       Ph: [phone]       Fax: [phone]   RxID:   [id number] FUROSEMIDE 20 MG  TABS (FUROSEMIDE) 1 by mouth once daily  #90 x 3   Entered and Authorized by:   [name] MD   Signed by:   [name] MD on 03/08/2008   Method used:   Electronically to        [facility] (retail)       [address]       [place], [place]  [address]  Ph: [phone]       Fax: [phone]   RxID:   [id number] NORVASC 10 MG  TABS (AMLODIPINE BESYLATE) 1 by mouth once  daily  #90 x 3   Entered and Authorized by:   [name] MD   Signed by:   [name] MD on 03/08/2008   Method used:   Electronically to        [facility] (retail)       [address]       [place], [place]  [address]       Ph: [phone]       Fax: [phone]   RxID:   [id number] HYDRALAZINE HCL 50 MG  TABS (HYDRALAZINE HCL) three times a day  #270 x 3   Entered and Authorized by:   [name] MD   Signed by:   [name] MD on 03/08/2008   Method used:   Electronically to        [facility] (retail)       [address]       [place], [place]  [address]       Ph: [phone]       Fax: [phone]   RxID:   [id number] LISINOPRIL 10 MG  TABS (LISINOPRIL) once daily  #90 x 3   Entered and Authorized by:   [name] MD   Signed by:   [name] MD on 03/08/2008   Method used:   Electronically to        [facility] (retail)       [address]       [place], [place]  [address]       Ph: [phone]       Fax: [phone]   RxID:   [id number] COREG 25 MG TABS (CARVEDILOL) one tab by mouth bid  #180 x 3   Entered and Authorized by:   [name] MD   Signed by:   [name] MD on 03/08/2008   Method used:   Electronically to        [facility] (retail)       [address]       [place],   [address]       Ph: [phone]       Fax: [phone]   RxID:   [id number]  ] Laboratory Results   Blood Tests   Date/Time Received: March 08, 2008 12:12 PM  Date/Time Reported: March 08, 2008 12:24 PM   HGBA1C: 6.6%   (Normal Range: Non-Diabetic - 3-6%   Control Diabetic - 6-8%)  Comments: ...........test performed by...........Marland KitchenHedy Camara, CMA      Appended Document: 11:30 f/u per Dr. Neal/bmc    Clinical Lists Changes  Problems: Removed problem of COUGH (ICD-786.2) Orders: Added new Test order of [facility]- Est  Level 4 [id number]) - Signed       Complete Medication List: 1)  Coreg 25 Mg Tabs (Carvedilol) .... One tab  by mouth bid 2)  Lisinopril 10 Mg Tabs (Lisinopril) .... Once daily 3)  Hydralazine Hcl 50 Mg Tabs (Hydralazine hcl) .... Three times a day 4)  Norvasc 10 Mg Tabs (Amlodipine besylate) .Marland Kitchen.. 1 by mouth once daily 5)  Simvastatin 80 Mg Tabs (Simvastatin) .Marland Kitchen.. 1 by mouth once daily 6)  Aspirin 325 Mg Tabs (Aspirin) .Marland Kitchen.. 1 qd 7)  Isosorbide Dinitrate 20 Mg Tabs (Isosorbide dinitrate) .... Tid 8)  Furosemide 20 Mg Tabs (Furosemide) .Marland Kitchen.. 1 by mouth once  daily 9)  Epipen 0.3 Mg/0.66ml (1:1000) Devi (Epinephrine hcl (anaphylaxis)) .... Use as directed for shortness of breath or dizziness associated with bee or wasp sting 10)  Tussionex Pennkinetic Er 8-10 Mg/56ml Lqcr (Chlorpheniramine-hydrocodone) .... 5cc by mouth two times a day as needed cough - caution with sedation. dipense one bottle.

## 2010-05-09 NOTE — Assessment & Plan Note (Signed)
Summary: f/u visit/bmc   Vital Signs:  Patient Profile:   50 Years Old Male Weight:      296.1 pounds Temp:     98.0 degrees F oral Pulse rate:   61 / minute BP sitting:   142 / 88  (left arm)  Vitals Entered By: Carolyne Littles (June 07, 2008 10:50 AM)             Is Patient Diabetic? No     Chief Complaint:  f/u meds.  History of Present Illness: F/U HTN:. Taking medicines regularly with no problems. Not having any any headaches or chest pains.  F/U ICD: Saw his cardiac doctor a few months ago and is due to see him again next month. He requests we send cards copy of labs (Dr Glennon Mac). No episodes of ICD firing  F/U obesity:Tring to walk a little more--has lost 12 pounds. Sawsy he would like to lose more--weather has kept him particularly inactive w his walking program  F/U chronic kidney insufficiency w hx ARF, hypokalemia: Wants to review his meds w me--also needs refills. Says he has no questiojns--brings all of his bottles.  Social: doing well at home, children keeping him "busy" and involved in life activities. Feels pretty happy.    Current Allergies: No known allergies   Past Medical History:    non-obstructive cardiomyopathy EF 20%-30%, normal coronaries from Cath 2006;  ; ECHO 01/2007 EF 25% -30%;     prolonged QT     Glucose intolerance--pre diabetes.    OSA    HTN    Obesity    renal insufficiency--worsens w ACE  Past Surgical History:    implantation AICD 03/2007 (Dr Caryl Comes)    Cardiologist Dr Glennon Mac     Review of Systems       The patient complains of weight loss.  The patient denies anorexia, fever, chest pain, syncope, peripheral edema, prolonged cough, headaches, abdominal pain, severe indigestion/heartburn, muscle weakness, and difficulty walking.         baseline DOE   Physical Exam  General:     alert, well-developed, well-hydrated, and overweight-appearing.   Eyes:     conjunctiva non icteric Neck:     supple, full ROM, no masses,  no thyromegaly, no JVD, and no carotid bruits.   Lungs:     normal respiratory effort, normal breath sounds, and no wheezes.   Heart:     somewhat distant heart sounds but RRR, S1S2 distinct, no murmur. Area of ICD looks well healed Abdomen:     soft, non-tender, and normal bowel sounds. obese Msk:     normal ROM all major joints, normal symmetrical upper and lower extremity strength grossly Neurologic:     alert & oriented X3 and gait normal.   Psych:     Oriented X3, memory intact for recent and remote, normally interactive, good eye contact, not anxious appearing, and not depressed appearing.      Impression & Recommendations:  Problem # 1:  HYPERTENSION, BENIGN (ICD-401.1) Assessment: Unchanged  His updated medication list for this problem includes:    Coreg 25 Mg Tabs (Carvedilol) ..... One tab by mouth bid    Lisinopril 10 Mg Tabs (Lisinopril) ..... Once daily    Hydralazine Hcl 50 Mg Tabs (Hydralazine hcl) .Marland Kitchen... Three times a day    Norvasc 10 Mg Tabs (Amlodipine besylate) .Marland Kitchen... 1 by mouth once daily    Furosemide 20 Mg Tabs (Furosemide) .Marland Kitchen... 1 by mouth once daily  Orders: Comp  Met-[facility] [phone]) Good cpntol--we reviewed his meds and made sure he was on only ONCE a day ACE as he had ARF w higher dose. Will follow his Creatinine closely w labs today  Orders: Comp [facility] [id number]) [place]- Est  Level 4 [id number])   Problem # 2:  OTHER ABNORMAL GLUCOSE (ICD-790.29) Assessment: Unchanged  Orders: Comp [facility] [id number]) [place]- Est  Level 4 (99214) A1C at last visit excellent--continue diet and exercise appropriate to his "pre-diabetes"  Problem # 3:  AUTOMATIC IMPLANTABLE CARDIAC DEFIBRILLATOR SITU (ICD-V45.02) Assessment: Unchanged  Problem # 4:  HYPERKALEMIA (ICD-276.7) Assessment: Unchanged check labs today Orders: [place]- Est  Level 4 (99214)   Problem # 5:  CHRONIC SYSTOLIC HEART FAILURE (123456) Assessment: Unchanged  His updated medication list  for this problem includes:    Coreg 25 Mg Tabs (Carvedilol) ..... One tab by mouth bid    Lisinopril 10 Mg Tabs (Lisinopril) ..... Once daily    Aspirin 325 Mg Tabs (Aspirin) .Marland Kitchen... 1 qd    Furosemide 20 Mg Tabs (Furosemide) .Marland Kitchen... 1 by mouth once daily  Orders: Comp [facility] [phone]) Direct LDL-[facility] [id number]) doing well--would rally encourage more weight loss and definitely continued walking  program--we discussed B Nat Peptide-[facility] [phone])  Orders: Comp [facility] [phone]) Direct LDL-[facility] [phone]) B Nat Peptide-[facility] [id number]) [facility]- Est  Level 4 [id number])   Complete Medication List: 1)  Coreg 25 Mg Tabs (Carvedilol) .... One tab by mouth bid 2)  Lisinopril 10 Mg Tabs (Lisinopril) .... Once daily 3)  Hydralazine Hcl 50 Mg Tabs (Hydralazine hcl) .... Three times a day 4)  Norvasc 10 Mg Tabs (Amlodipine besylate) .Marland Kitchen.. 1 by mouth once daily 5)  Simvastatin 80 Mg Tabs (Simvastatin) .Marland Kitchen.. 1 by mouth once daily 6)  Aspirin 325 Mg Tabs (Aspirin) .Marland Kitchen.. 1 qd 7)  Isosorbide Dinitrate 20 Mg Tabs (Isosorbide dinitrate) .... Tid 8)  Furosemide 20 Mg Tabs (Furosemide) .Marland Kitchen.. 1 by mouth once daily 9)  Epipen 0.3 Mg/0.47ml (1:1000) Devi (Epinephrine hcl (anaphylaxis)) .... Use as directed for shortness of breath or dizziness associated with bee or wasp sting 10)  Tussionex Pennkinetic Er 8-10 Mg/66ml Lqcr (Chlorpheniramine-hydrocodone) .... 5cc by mouth two times a day as needed cough - caution with sedation. dipense one bottle.

## 2010-05-09 NOTE — Progress Notes (Signed)
   Phone Note Call from Patient Call back at Home Phone [phone]   Caller: Patient Summary of Call: PT SAID HIS INSURANCE WILL PAY FOR MONITOR HE [place] Initial call taken by: Delsa Sale,  March 20, 2009 2:19 PM  Follow-up for Phone Call        Called patient and left message on machine  Barnett Abu, RN, BSN  March 20, 2009 3:31 PM  Late Entry: Charmaine called and s/w pt.   Follow-up by: Barnett Abu, RN, BSN,  March 27, 2009 9:14 AM

## 2010-05-09 NOTE — Letter (Signed)
Summary: [facility]  [address]   [place], [place] [address]   Phone: [phone]  Fax: [phone]    07/05/2007  [name] [address] [place], [place]  [address]  Dear [name],   Your kidney function looked great. your blood sugar was still borderline high--we may at some point need to start a medicine for this. Let's discuss at the next visit. Remind me.Everything else looked great!        Sincerely,   [name] MD [place]  Appended Document: LABLetter Letter sent via mail to pt ..................Marland KitchenDelores Pate-Gaddy, CMA (AAMA)

## 2010-05-09 NOTE — Progress Notes (Signed)
Summary: phn msg   Phone Note Call from Patient Call back at Home Phone [phone]   Caller: Spouse Samatha Summary of Call: Wants to talk to Dr. Nori Riis or her nurse about his shoulder. Initial call taken by: Raymond Gurney,  Aug 15, 2009 1:37 PM  Follow-up for Phone Call        spoke with Aldona Bar and informed her that her husband has an appt with dr. Nori Riis 08/27/2009 @ 315pm Follow-up by: Audelia Hives CMA,  Aug 15, 2009 5:37 PM

## 2010-05-09 NOTE — Procedures (Signed)
Summary: pacer check/sl      Allergies Added: NKDA   Current Medications (verified): 1)  Coreg 25 Mg Tabs (Carvedilol) .... One Tab By Mouth Bid 2)  Lisinopril 10 Mg  Tabs (Lisinopril) .... Once Daily 3)  Hydralazine Hcl 50 Mg  Tabs (Hydralazine Hcl) .... Three Times A Day 4)  Norvasc 10 Mg  Tabs (Amlodipine Besylate) .Marland Kitchen.. 1 By Mouth Once Daily 5)  Simvastatin 80 Mg Tabs (Simvastatin) .Marland Kitchen.. 1 By Mouth Once Daily 6)  Aspirin 325 Mg Tabs (Aspirin) .Marland Kitchen.. 1 Qd 7)  Isosorbide Dinitrate 20 Mg  Tabs (Isosorbide Dinitrate) .... Tid 8)  Furosemide 20 Mg  Tabs (Furosemide) .Marland Kitchen.. 1 By Mouth Once Daily 9)  Epipen 0.3 Mg/0.24ml (1:1000)  Devi (Epinephrine Hcl (Anaphylaxis)) .... Use As Directed For Shortness of Breath or Dizziness Associated With Bee or Wasp Sting 10)  Tussionex Pennkinetic Er 8-10 Mg/70ml Lqcr (Chlorpheniramine-Hydrocodone) .... 5cc By Mouth Two Times A Day As Needed Cough - Caution With Sedation. Dipense One Bottle.  Allergies (verified): No Known Drug Allergies  ICD Specifications ICD Vendor:  Medtronic     ICD Model Number:  [id number]     ICD Serial Number:  [id number] ICD DOI:  02/19/2007     ICD Implanting MD:  [name], MD  Lead 1:    Location: RV     DOI: 02/19/2007     Model #: [id number]     Serial #[id number]     Status: active  Indications::  ICM   ICD Specs Remote Check?  No Battery Voltage:  3.16 V     Charge Time:  7.41 seconds     Underlying rhythm:  SR@68  ICD Dependent:  No       ICD Device Measurements [place]:  Right Ventricle:  Amplitude: 7.7 mV, Impedance: 408 ohms, Threshold: 1.0 V at 0.2 msec Left Ventricle:  Shock Impedance: 39 ohms   Episodes Coumadin:  No Shock:  0     ATP:  0     Nonsustained:  1     Ventricular Pacing:  0%  Brady Parameters Mode VVI     Lower Rate Limit:  40      Tachy Zones VF:  188     VT:  150     Next Cardiology Appt Due:  11/05/2008 Tech Comments:  No changes Alma Friendly, LPN  May  6, [id number] [id number] AM

## 2010-05-09 NOTE — Progress Notes (Signed)
Summary: Refill  Phone Note Call from Patient   Summary of Call: Pt needs refill on coreg.  Pt uses [facility] [address].  Pt is completely out. Initial call taken by: Drucie Ip,  September 13, 2007 2:42 PM  Follow-up for Phone Call        Pt informed that refill was sent Follow-up by: [facility] CMA,  September 13, 2007 3:06 PM      Prescriptions: COREG 25 MG TABS (CARVEDILOL) one tab by mouth bid  #60 x 3   Entered by:   Christen Bame CMA   Authorized by:   [name] MD   Signed by:   Christen Bame CMA on 09/13/2007   Method used:   Electronically sent to ...       [place], [place]  [address]       Ph: [phone]       Fax: [phone]   RxID:   [id number]

## 2010-05-09 NOTE — Progress Notes (Signed)
Summary: triage  Phone Note Call from Patient Call back at Home Phone [phone]   Caller: Patient Summary of Call: had flu shot on Monday and uis now coughing and is not feeling well.  Should he come in??? Initial call taken by: Audie Clear,  January 12, 2008 3:54 PM  Follow-up for Phone Call        Pt states having coughing, fever, and aches- got flu shot Monday.  Advised per Dr. Nori Riis try robitussin and tylenol for cough, fever, and aches.  Call back tomorrow if this does not help tonight, as cough could be related to CHF. Follow-up by: AMY MARTIN RN,  January 12, 2008 4:10 PM

## 2010-05-09 NOTE — Progress Notes (Signed)
Summary: Refill  Phone Note Call from Patient Call back at Home Phone [phone]   Caller: Wife Summary of Call: Needs refill on Lisnopril, 90 day supply. States the pharmacy told her to call us for refill.  [facility] Initial call taken by: Drucie Ip,  March 11, 2007 1:59 PM  Follow-up for Phone Call        refill put in to be faxed pile .Marland Kitchen.he will need a clinic visit before the next one is filled b/c he will likely have a dose change. Please inform patient Follow-up by: Kaylyn Layer MD,  March 12, 2007 1:05 PM  Additional Follow-up for Phone Call Additional follow up Details #1::        Pt has an appt with Nori Riis for [facility] 7,09. Is that ok or does he need one with you. Additional Follow-up by: [facility] CMA,  March 12, 2007 1:42 PM         Appended Document: Refill Wife is calling back, sts they can't get it though [facility], asking for a 90 day supply, wife may be reached at [phone].  Appended Document: [facility] [address]

## 2010-05-09 NOTE — Letter (Signed)
Summary: LAB Letter  [facility]  [address]   [place], [place] [address]   Phone: [phone]  Fax: [phone]    06/17/2007  ESAU Dalton [address] [place], [place]  [address]  Dear [name],   All of your labs looked really GOOD!. Hope you are doing better.        Sincerely,   [name] [place]  Appended Document: LAB Letter letter sent via mail to pt ..................Marland KitchenDelores Pate-Gaddy, CMA (AAMA)

## 2010-05-09 NOTE — Progress Notes (Signed)
Summary: referral   Phone Note Call from Patient Call back at Home Phone [phone] Call back at [phone]   Caller: spouse-[name] Summary of Call: requesting ortho appt, sts MD is aware Initial call taken by: [name],  March 11, 2010 10:11 AM  Follow-up for Phone Call        forwarded to pcp Follow-up by: [name] CMA,  March 11, 2010 12:18 PM  Additional Follow-up for Phone Call Additional follow up Details #1::        pt is calling again wants to know about referral Additional Follow-up by: [name],  March 12, 2010 4:18 PM    Additional Follow-up for Phone Call Additional follow up Details #2::    [name] is calling again to ask about referral- pls advise Follow-up by: [name],  March 14, 2010 12:21 PM  Additional Follow-up for Phone Call Additional follow up Details #3:: Details for Additional Follow-up Action Taken: orders faxed to [facility] ortho [id number] they will call and schedule appt for pt. spoke with pt's [name] [name] and she will inform him of this Additional Follow-up by: [name] CMA,  March 15, 2010 9:10 AM    Complete Medication List: 1)  Coreg 25 Mg Tabs (Carvedilol) .... One tab by mouth bid 2)  Hydralazine Hcl 50 Mg Tabs (Hydralazine hcl) .... Three times a day 3)  Norvasc 10 Mg Tabs (Amlodipine besylate) .Marland Kitchen.. 1 by mouth once daily 4)  Aspirin 325 Mg Tabs (Aspirin) .... Take 1 tablet by mouth once daily 5)  Isosorbide Dinitrate 20 Mg Tabs (Isosorbide dinitrate) .... Take 1 tablet by mouth three times a day 6)  Furosemide 20 Mg Tabs (Furosemide) .... 2 by mouth once daily 7)  Cozaar 50 Mg Tabs (Losartan potassium) .... Take one tablet daily 8)  Aldactone 25 Mg Tabs (Spironolactone) .Marland Kitchen.. 1 by mouth once daily per dr Glennon Mac 9)  Simvastatin 40 Mg Tabs (Simvastatin) .... Once daily

## 2010-05-09 NOTE — Assessment & Plan Note (Signed)
Summary: NP,SHOULDER PAIN PER NEAL,[facility]   Primary Care Provider:  [name] MD   History of Present Illness: DATE of INJURY (approximate): 06/05/2009 Left shoulder pain for 2 months. Right hand dominant. No injury that he is aware of.  Pain worst at night, front part of shoulder that seems to penetrate deep into joint. Activities involving pushing forward or overhead work seem to make it worse. At worst pain is 5/10. No numbness or tingling in his fingers  PERTINENT PMH/PSH:  Has had no prior problems or iinjury to shoulder. Hx hyperglycemia  Allergies: No Known Drug Allergies  Past History:  Past Medical History: Last updated: 08/14/2009 non-obstructive cardiomyopathy EF 20%-30%, normal coronaries from Cath 2006;  ; ECHO 01/2007 EF 25% -30%;  prolonged QT  Glucose intolerance--pre diabetes. Obstructive sleep apnea      - AHI 40      - CPAP 18 cm H2O HTN Obesity renal insufficiency--worsens w ACE AICD-Medtronic Maximo 7232  Past Surgical History: Last updated: 04/25/2009 implantation AICD 03/2007 (Dr Klein)-[employer]tronic Maximo [phone] Cardiologist [name] ( general cardiology) and Dr Caryl Comes (folllows AICD) Right knee surgery  Review of Systems  The patient denies fever, chest pain, and peripheral edema.    Physical Exam  General:  alert and overweight-appearing.   Msk:  L SHOULDER FROm. He has pain with resisted arm elevation in forward flexion, pain with crossover testing. Pain at site of [facility] joint. Rotator cuff muscle strength seems intact. Pain also with IR as he tries to place hand behind back and he cannot do lift off test well (on either side) due to tightness of muscles.  distally he is NVintact to soft touch and normal radialpulses.  [place] limited; attempted but due to body habitus images were suboptimal. I also could not siualize his AC joint. Additional Exam:  Patient given informed consent for injection. Discussed possible complications of infection, bleeding or skin  atrophy at site of injection. Possible side effect of avascular necrosis (focal area of bone death) due to steroid use.Appropriate verbal time out taken Are cleaned and prepped in usual sterile fashion. A --1/2 cc-- cc kennalog 40plus -1---cc 1% lidocaine without epinephrine was injected into the--approximate area of teh AC joint. I used a 1 1/2 inch needle and was able to encounter bone but I am not sure we were actually inside the [facility] joint.-. Patient tolerated procedure well with no complications.    Impression & Recommendations:  Problem # 1:  SHOULDER PAIN, LEFT (ICD-719.41)  the subacromial iinjection I had given him previously helped only a little. I had hoped to be able to do US exam today but secondary to his habitus, we could not get adequate visualization. I suspect a componeent of AC arthritis and we injected that area 9aAC joint) today. he will let mne know in a week how he is doing.  Orders: Joint Aspirate / Injection, Small (20600) Kenalog 10 mg inj (J3301)  Complete Medication List: 1)  Coreg 25 Mg Tabs (Carvedilol) .... One tab by mouth bid 2)  Hydralazine Hcl 50 Mg Tabs (Hydralazine hcl) .... Three times a day 3)  Norvasc 10 Mg Tabs (Amlodipine besylate) .Marland Kitchen.. 1 by mouth once daily 4)  Simvastatin 80 Mg Tabs (Simvastatin) .Marland Kitchen.. 1 by mouth once daily 5)  Aspirin 325 Mg Tabs (Aspirin) .Marland Kitchen.. 1 qd 6)  Isosorbide Dinitrate 20 Mg Tabs (Isosorbide dinitrate) .... Tid 7)  Furosemide 20 Mg Tabs (Furosemide) .... 2 by mouth two times a day 8)  Epipen 0.3  Mg/0.20ml (1:1000) Devi (Epinephrine hcl (anaphylaxis)) .... Use as directed for shortness of breath or dizziness associated with bee or wasp sting 9)  Cozaar 50 Mg Tabs (Losartan potassium) .... Take one tablet daily 10)  Aldactone 25 Mg Tabs (Spironolactone) .Marland Kitchen.. 1 by mouth once daily per dr Glennon Mac

## 2010-05-09 NOTE — Letter (Signed)
Summary: LAB Letter  [facility]  [address]   [place], [place] [address]   Phone: [phone]  Fax: [phone]    01/11/2010  TAISEAN RISKO [address] [place], [place]  [address]  Dear Mr. GREIWE,     Your A1C was 7.4 as we discussed. Improved but not yet perfect. The rest of your labs were normal. Great to see you!      Sincerely,   [name] MD  Appended Document: LAB Letter mailed.

## 2010-05-09 NOTE — Letter (Signed)
Summary: [facility]  [address]   [place], [place] [address]   Phone: [phone]  Fax: [phone]    03/15/2010  [name] [address] [place], [place]  [address]  Dear [name],   All of your lab work was OK except your kidney function has bumped up a bit. Please come for a lab only appointment in 2 weeks so we can follow this. Greatto see you!        Sincerely,   [name] MD  Appended Document: LABLetter mailed

## 2010-05-09 NOTE — Miscellaneous (Signed)
Summary: future orders   Clinical Lists Changes  Orders: Added new Test order of Basic Met-[facility] [phone]) - Signed      Complete Medication List: 1)  Coreg 25 Mg Tabs (Carvedilol) .... One tab by mouth bid 2)  Hydralazine Hcl 50 Mg Tabs (Hydralazine hcl) .... Three times a day 3)  Norvasc 10 Mg Tabs (Amlodipine besylate) .Marland Kitchen.. 1 by mouth once daily 4)  Simvastatin 80 Mg Tabs (Simvastatin) .Marland Kitchen.. 1 by mouth once daily 5)  Aspirin 325 Mg Tabs (Aspirin) .Marland Kitchen.. 1 qd 6)  Isosorbide Dinitrate 20 Mg Tabs (Isosorbide dinitrate) .... Tid 7)  Furosemide 20 Mg Tabs (Furosemide) .... 2 by mouth two times a day 8)  Epipen 0.3 Mg/0.31ml (1:1000) Devi (Epinephrine hcl (anaphylaxis)) .... Use as directed for shortness of breath or dizziness associated with bee or wasp sting 9)  Cozaar 50 Mg Tabs (Losartan potassium) .... Take one tablet daily 10)  Aldactone 25 Mg Tabs (Spironolactone) .Marland Kitchen.. 1 by mouth once daily per dr Glennon Mac

## 2010-05-09 NOTE — Assessment & Plan Note (Signed)
Summary: cough for a week   Vital Signs:  Patient Profile:   50 Years Old Male Weight:      294.1 pounds O2 Sat:      97 % Temp:     98.3 degrees F Pulse rate:   65 / minute BP sitting:   131 / 75  Pt. in pain?   no  Vitals Entered By: Marcell Barlow RN (January 21, 2008 8:41 AM)              Is Patient Diabetic? Yes     Chief Complaint:  cough.  History of Present Illness: [name] with h/o CHF presents with nonproductive cough x 5 days and mild nasal congestion. Reports having flu shot Monday and subsequently started feeling achey, feverish, and developed cough. Most signs except cough and runny nose resolved within 24 hours. He is using robitussin without relief. He denies SOB, leg edema, or recent signficant weight changes. No chest pain or orthopnea.  Cough the worst at night.  Has appt with cardiologist in 30 minutes.  Taking all his meds without problems.     Current Allergies: No known allergies   Past Medical History:    Reviewed history from 04/14/2007 and no changes required:       non-obstructive cardiomyopathy EF 20%-30%, normal coronaries from Cath 2006;  ; ECHO 01/2007 EF 25% -30%;        prolonged QT        DM 2       OSA       HTN       Obesity       renal insufficiency--worsens w ACE   Social History:    Reviewed history from 11/17/2007 and no changes required:       lives in [place] with wife and 2 children. 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 (stopped as of 8/09)     Physical Exam  General:     overweight-appearing.  NAD. coughing intermittently Head:     normocephalic and atraumatic.   Nose:     External nasal examination shows no deformity or inflammation. Nasal mucosa are pink and moist without lesions or exudates. Mouth:     Oral mucosa and oropharynx  without lesions or exudates.  Neck:     supple and full ROM.   Lungs:     normal respiratory effort, no accessory muscle use,  very scant crackles left base but otherwise clear without wheezes or rhonchi, normal I:E Heart:     normal rate, regular rhythm, and no murmur.  distant heart sounds Pulses:     2+ radial pulses Extremities:     no edema     Impression & Recommendations:  Problem # 1:  COUGH (ICD-786.2) Assessment: New Likely 2/2 viral URI but given h/o significant CHF and ?crackles at left base, will send for CXR.  Weight is up approx 50lbs but I think that is a typo from the last visit so I don't have any way to gauge if he's fluid overloaded. He feels subjectively like he's total body water down. Asked him to hold his ACE given decreased by mouth intake until feeling better. Tussionex as needed for cough. FU with cardiologist later this morning. Red  flags given. FU if no improvement or any time worsening. I will call after CXR.  Orders: CXR- [facility]view (CXR) [place]- Est  Level 4 [id number])   Complete Medication List: 1)  Coreg 25 Mg Tabs (Carvedilol) .... One tab by mouth bid 2)  Lisinopril 10 Mg Tabs (Lisinopril) .... Once daily 3)  Hydralazine Hcl 50 Mg Tabs (Hydralazine hcl) .... Three times a day 4)  Norvasc 10 Mg Tabs (Amlodipine besylate) .Marland Kitchen.. 1 by mouth once daily 5)  Pravastatin Sodium 80 Mg Tabs (Pravastatin sodium) .... One tab by mouth qhs 6)  Aspirin 325 Mg Tabs (Aspirin) .Marland Kitchen.. 1 qd 7)  Isosorbide Dinitrate 20 Mg Tabs (Isosorbide dinitrate) .... Tid 8)  Furosemide 20 Mg Tabs (Furosemide) .Marland Kitchen.. 1 by mouth once daily 9)  Epipen 0.3 Mg/0.40ml (1:1000) Devi (Epinephrine hcl (anaphylaxis)) .... Use as directed for shortness of breath or dizziness associated with bee or wasp sting 10)  Tussionex Pennkinetic Er 8-10 Mg/8ml Lqcr (Chlorpheniramine-hydrocodone) .... 5cc by mouth two times a day as needed cough - caution with sedation. dipense one bottle.   Patient  Instructions: 1)  Chest xray today. 2)  Tussionex one teaspoon two times a day as needed cough. 3)  Hold your lisinopril for a few days until you start feeling better and drinking more fluids.  4)  Keep your appointment with [name].  5)  If at any point you start having trouble breathing or chest pains, you need to be seen by a doctor right away.    Prescriptions: TUSSIONEX PENNKINETIC ER 8-10 MG/5ML LQCR (CHLORPHENIRAMINE-HYDROCODONE) 5cc by mouth two times a day as needed cough - caution with SEDATION. dipense one bottle.  #1 x 0   Entered and Authorized by:   Mayra Neer MD   Signed by:   Mayra Neer MD on 01/21/2008   Method used:   Print then Give to Patient   RxID:   [id number] [place] ER 8-10 MG/5ML LQCR (CHLORPHENIRAMINE-HYDROCODONE) 5cc by mouth two times a day as needed cough - caution with SEDATION. dipense one bottle.  #1 x 0   Entered and Authorized by:   Mayra Neer MD   Signed by:   Mayra Neer MD on 01/21/2008   Method used:   Print then Give to Patient   RxID:   [id number]  ]  Vital Signs:  Patient Profile:   50 Years Old Male Weight:      294.1 pounds O2 Sat:      97 % Temp:     98.3 degrees F Pulse rate:   65 / minute BP sitting:   131 / 75

## 2010-05-09 NOTE — Miscellaneous (Signed)
Summary: Future orders   Clinical Lists Changes  Orders: Added new Test order of [facility] [id number]) - Signed      Complete Medication List: 1)  Coreg 25 Mg Tabs (Carvedilol) .... One tab by mouth bid 2)  Hydralazine Hcl 50 Mg Tabs (Hydralazine hcl) .... Three times a day 3)  Norvasc 10 Mg Tabs (Amlodipine besylate) .Marland Kitchen.. 1 by mouth once daily 4)  Aspirin 325 Mg Tabs (Aspirin) .... Take 1 tablet by mouth once daily 5)  Isosorbide Dinitrate 20 Mg Tabs (Isosorbide dinitrate) .... Take 1 tablet by mouth three times a day 6)  Furosemide 20 Mg Tabs (Furosemide) .... 2 by mouth once daily 7)  Cozaar 50 Mg Tabs (Losartan potassium) .... Take one tablet daily 8)  Aldactone 25 Mg Tabs (Spironolactone) .Marland Kitchen.. 1 by mouth once daily per dr Glennon Mac 9)  Simvastatin 40 Mg Tabs (Simvastatin) .... Once daily

## 2010-05-09 NOTE — Assessment & Plan Note (Signed)
Summary: F/U VISIT@ 10:00 PER DR. NEAL/[facility]   Vital Signs:  Patient Profile:   50 Years Old Male Weight:      282 pounds Temp:     97.8 degrees F Pulse rate:   57 / minute BP sitting:   142 / 80  Vitals Entered By: Christen Bame CMA (June 30, 2007 9:22 AM)                 Chief Complaint:  f/u per neal.  History of Present Illness: f/u recent changes in his BP/heart med regimen. We had added lasix. he is doing much better w his edema and sob. Feels back to baseline. lower extremity edema is gone. back to walking daily (mostly) and gets sob with hills only. No chest pains. taking all of his meds regularly--needs refill on hydralazine  has no other complaints.    Past Medical History:    Reviewed history from 04/14/2007 and no changes required:       non-obstructive cardiomyopathy EF 20%-30%, normal coronaries from Cath 2006;  ; ECHO 01/2007 EF 25% -30%;        prolonged QT        DM 2       OSA       HTN       Obesity       renal insufficiency--worsens w ACE   Social History:    lives in [place] with wife and 2 children. 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     Physical Exam  General:     alert and well-developed.   Eyes:     non icteric Neck:     supple, full ROM, and no masses.  no bruits Lungs:     Normal respiratory effort, chest expands symmetrically. Lungs are clear to auscultation, no crackles or wheezes. Heart:     distant heart sounds, rrr, no murmur. Abdomen:     obese, soft + bowel sounds Extremities:     only trace edema in lower extremity today Neurologic:     alert & oriented X3.      Impression & Recommendations:  Problem # 1:  CHRONIC SYSTOLIC HEART FAILURE (123456)  His updated medication list for this problem includes:    Coreg 25 Mg Tabs (Carvedilol) ..... One  tab by mouth bid    Lisinopril 10 Mg Tabs (Lisinopril) ..... Once daily    Aspirin 325 Mg Tabs (Aspirin) .Marland Kitchen... 1 qd    Furosemide 20 Mg Tabs (Furosemide) .Marland Kitchen... 1 by mouth once daily seems to be doing great on this current regimen. Will make no changes. recheck kidney function today. rtc 2 m---certainly sooner w any changes in status--he understands he is on precarious balance. Orders: [facility] [id number]) [place]- Est Level  3 [id number])   Complete Medication List: 1)  Coreg 25 Mg Tabs (Carvedilol) .... One tab by mouth bid 2)  Lisinopril 10 Mg Tabs (Lisinopril) .... Once daily 3)  Hydralazine Hcl 50 Mg Tabs (Hydralazine hcl) .... Three times a day 4)  Norvasc 10 Mg Tabs (Amlodipine besylate) .Marland Kitchen.. 1 by mouth once daily 5)  Pravastatin Sodium 80 Mg Tabs (Pravastatin sodium) .... One tab by mouth qhs 6)  Aspirin 325 Mg Tabs (Aspirin) .Marland Kitchen.. 1 qd 7)  Isosorbide Dinitrate 20 Mg  Tabs (Isosorbide dinitrate) .... Hold 8)  Furosemide 20 Mg Tabs (Furosemide) .Marland Kitchen.. 1 by mouth once daily   Patient Instructions: 1)  Please schedule a follow-up appointment in 2 months. 2)  If you start to gain water weight or some other issue arises, please call and askto be worked in to my schedule.    Prescriptions: HYDRALAZINE HCL 50 MG  TABS (HYDRALAZINE HCL) three times a day  #270 x 3   Entered and Authorized by:   [name] MD   Signed by:   [name] MD on 06/30/2007   Method used:   Electronically sent to ...       [facility] [place], [place]  [address]       Ph: [phone]       Fax: [phone]   RxID:   [id number] HYDRALAZINE HCL 50 MG  TABS (HYDRALAZINE HCL) three times a day  #90 x 12   Entered and Authorized by:   [name] MD   Signed by:   [name] MD on 06/30/2007   Method used:   Electronically sent to ...       [place], [place]  [address]       Ph: [phone]       Fax: [phone]   RxID:    [id number]  ]

## 2010-05-09 NOTE — Assessment & Plan Note (Signed)
Summary: f/u bp/eo   Vital Signs:  Patient profile:   50 year old male Weight:      312.2 pounds Temp:     98.4 degrees F oral Pulse rate:   73 / minute Pulse rhythm:   regular BP sitting:   135 / 88  (left arm) Cuff size:   large  Vitals Entered By: Audelia Hives CMA (April 25, 2009 11:04 AM)  Primary Care Provider:  [name] MD   History of Present Illness: Saw Dr Glennon Mac and he gave him Rx for aldactone--he has not started it yet--wants my opinion. He thinks it was added to help his fluid retention--his lasix was also upped. His other meds the same.  Has not been walking much at all. Is having some calf cramps throughout day and especially at night.  Denies any change in exercise level. Denies any chest pains. Admits he has gained some weight over the holidays.  Taking meds regularly and without problems. Needs some refills.  Current Medications (verified): 1)  Coreg 25 Mg Tabs (Carvedilol) .... One Tab By Mouth Bid 2)  Hydralazine Hcl 50 Mg  Tabs (Hydralazine Hcl) .... Three Times A Day 3)  Norvasc 10 Mg  Tabs (Amlodipine Besylate) .Marland Kitchen.. 1 By Mouth Once Daily 4)  Simvastatin 80 Mg Tabs (Simvastatin) .Marland Kitchen.. 1 By Mouth Once Daily 5)  Aspirin 325 Mg Tabs (Aspirin) .Marland Kitchen.. 1 Qd 6)  Isosorbide Dinitrate 20 Mg  Tabs (Isosorbide Dinitrate) .... Tid 7)  Furosemide 20 Mg  Tabs (Furosemide) .... 2 By Mouth Two Times A Day 8)  Epipen 0.3 Mg/0.61ml (1:1000)  Devi (Epinephrine Hcl (Anaphylaxis)) .... Use As Directed For Shortness of Breath or Dizziness Associated With Bee or Wasp Sting 9)  Cozaar 50 Mg Tabs (Losartan Potassium) .... Take One Tablet Daily 10)  Aldactone 25 Mg Tabs (Spironolactone) .Marland Kitchen.. 1 By Mouth Once Daily Per Dr Glennon Mac  Allergies (verified): No Known Drug Allergies  Past History:  Past Medical History: non-obstructive cardiomyopathy EF 20%-30%, normal coronaries from Cath 2006;  ; ECHO 01/2007 EF 25% -30%;  prolonged QT  Glucose intolerance--pre diabetes. Sleep  apnea--both obstructive and with a central component--Dr Sood--2011. Will need CPAP. titration 05/2009 HTN Obesity renal insufficiency--worsens w ACE AICD-Medtronic Maximo [id number]  Past Surgical History: implantation AICD 03/2007 (Dr Klein)-[employer]onic Maximo [phone] Cardiologist [name] ( general cardiology) and Dr Caryl Comes (folllows AICD) Right knee surgery  Review of Systems       and as per hpi  Physical Exam  General:  overweight-appearing.   Eyes:  pupils equal and pupils round.   Neck:  supple, full ROM, no masses, no thyromegaly, no thyroid nodules or tenderness, no JVD, and no carotid bruits.   Lungs:  breath sounds clear but a little decreased B--I think secondary  to habitus. no rales, no wheeze Heart:  normal rate, regular rhythm, no murmur, and no gallop.   Abdomen:  soft, non-tender, and normal bowel sounds.  obese Msk:  normal ROM.   Pulses:  2-3+ Dp and 2+ post tib B Extremities:  no edema appreciated Neurologic:  alert & oriented X3 and gait normal.   Skin:  feet skin normal, no rash, no petechia. good cap refill Psych:  Oriented X3, memory intact for recent and remote, normally interactive, good eye contact, not anxious appearing, and not depressed appearing.     Impression & Recommendations:  Problem # 1:  HYPERTENSION, BENIGN (ICD-401.1) Assessment Unchanged  Orders: [facility] [id number]) [place]- Est  Level 4 [id number])  am not clear why Dr Glennon Mac added the aldactone--he does not have a lot of edema so I do not think it was for diuresis as [name] believes. Will check potassium and creatinine today. Start spironolactone per Dr Jake Samples of [facility] and hyperkalemia in past w ACE/ARB combo so will have him f/u 1 week for repeat labs. fax to Dr Glennon Mac  Problem # 2:  Hx of HYPERKALEMIA (ICD-276.7) Assessment: Unchanged  Orders: [facility] [id number]) [place]- Est  Level 4 [id number])  Problem # 3:  OBSTRUCTIVE SLEEP APNEA (ICD-327.23) currently being addressed  by Dr Halford Chessman. we discussed importance of treatment.  Problem # 4:  CARDIOMYOPATHY, PRIMARY, DILATED (ICD-425.4)  we discussed weight loss, absolutely needs to restart walking program. Gave him some stretches to initiate for calves.  Orders: [place]- Est  Level 4 ([address])  Problem # 5:  OTHER ABNORMAL GLUCOSE (ICD-790.29)  check random glucose today  Orders: [place]- Est  Level 4 ([address])  Problem # 6:  CHRONIC KIDNEY DISEASE UNSPECIFIED (ICD-585.9)  check creatinine today  Orders: [place]- Est  Level 4 [id number])  Problem # 7:  Preventive Health Care (ICD-V70.0) will need to discuss colonoscopy and PSA testing at next visit  Complete Medication List: 1)  Coreg 25 Mg Tabs (Carvedilol) .... One tab by mouth bid 2)  Hydralazine Hcl 50 Mg Tabs (Hydralazine hcl) .... Three times a day 3)  Norvasc 10 Mg Tabs (Amlodipine besylate) .Marland Kitchen.. 1 by mouth once daily 4)  Simvastatin 80 Mg Tabs (Simvastatin) .Marland Kitchen.. 1 by mouth once daily 5)  Aspirin 325 Mg Tabs (Aspirin) .Marland Kitchen.. 1 qd 6)  Isosorbide Dinitrate 20 Mg Tabs (Isosorbide dinitrate) .... Tid 7)  Furosemide 20 Mg Tabs (Furosemide) .... 2 by mouth two times a day 8)  Epipen 0.3 Mg/0.76ml (1:1000) Devi (Epinephrine hcl (anaphylaxis)) .... Use as directed for shortness of breath or dizziness associated with bee or wasp sting 9)  Cozaar 50 Mg Tabs (Losartan potassium) .... Take one tablet daily 10)  Aldactone 25 Mg Tabs (Spironolactone) .Marland Kitchen.. 1 by mouth once daily per dr Glennon Mac  Patient Instructions: 1)  After you start the aldactone that Dr Glennon Mac prescribed---after you have been on it a week--please come by for a lab visit. You do not need to see me. You can call the day that you want to come in and they can tell you the least busy time so you will not have to wait as long. The oder for the test is already in the sytem. 2)  I will fax both the labs from today and from next week to Dr Glennon Mac. 3)  Great to see you again. 4)  Try the calf stretches and try to get  back into your walking program soon. 5)  See me back in 3 months. Prescriptions: SIMVASTATIN 80 MG TABS (SIMVASTATIN) 1 by mouth once daily  #90 x 3   Entered and Authorized by:   [name] MD   Signed by:   [name] MD on 04/25/2009   Method used:   Electronically to        [facility] [phone]* (retail)       [place], [place]  [address]       Ph: [phone]       Fax: [phone]   [id number] COREG 25 MG TABS (CARVEDILOL) one tab by mouth bid  #180 x 3   Entered and Authorized by:   [name]  MD   Signed by:   [name] MD on 04/25/2009   Method used:   Electronically to        [facility] [phone]* (retail)       [address]       [place], [place]  [address]       Ph: [phone]       Fax: [phone]   RxID:   [id number]   Prevention & Chronic Care Immunizations   Influenza vaccine: Fluvax Non-MCR  (01/10/2008)   Influenza vaccine deferral: Refused  (01/24/2009)    Tetanus booster: 11/17/2007: Tdap   Tetanus booster due: 11/16/2017    Pneumococcal vaccine: Not documented    Immunization comments: refused flu shot 04/25/2009  Colorectal Screening   Hemoccult: Not documented    Colonoscopy: Not documented  Other Screening   PSA: Not documented   Smoking status: never  (04/23/2009)  Lipids   Total Cholesterol: 121  (04/14/2007)   LDL: 75  (04/14/2007)   LDL Direct: 92  (01/24/2009)   HDL: 31  (04/14/2007)   Triglycerides: 76  (04/14/2007)  Hypertension   Last Blood Pressure: 135 / 88  (04/25/2009)   Serum creatinine: 1.21  (01/24/2009)   Serum potassium 3.2  (01/24/2009)    Hypertension flowsheet reviewed?: Yes   Progress toward BP goal: At goal  Self-Management Support :   Personal Goals (by the next clinic visit) :      Personal blood pressure goal: 140/90  (01/24/2009)   Hypertension self-management support: Written self-care plan  (01/24/2009)

## 2010-05-09 NOTE — Letter (Signed)
Summary: [facility]  [address]   [place], [place] [address]   Phone: [phone]  Fax: [phone]    08/26/2007  [name] [address] [place], [place]  [address]  Dear [name],    Your A1C was 6.7 This is very very good!       Sincerely,   [name] MD [place]  Appended Document: LAB Letter sent

## 2010-05-09 NOTE — Assessment & Plan Note (Signed)
Summary: PC2/ GD  Medications Added FUROSEMIDE 20 MG  TABS (FUROSEMIDE) 2 by mouth once daily      Allergies Added: NKDA  Referring Provider:  Dr. [name], Dr. Charolette Forward Primary Provider:  [name] MD   History of Present Illness:  [name] is seen in followup for an ICD implanted in the setting of nonischemic heart disease and congestive  heart failure.     He is doing pretty well.  He had shortness of breath and some peripheral edema.  We reviewed his dietary salt intake;   he eats frequent sandwiches with cold cuts and prepared foods  He has sleep disordered breathing and  daytime somnolence;  his sleep study was positive; but he has not been able to comply with a mask. I've encouraged to follow up with Dr. Halford Chessman     Current Medications (verified): 1)  Coreg 25 Mg Tabs (Carvedilol) .... One Tab By Mouth Bid 2)  Hydralazine Hcl 50 Mg  Tabs (Hydralazine Hcl) .... Three Times A Day 3)  Norvasc 10 Mg  Tabs (Amlodipine Besylate) .Marland Kitchen.. 1 By Mouth Once Daily 4)  Simvastatin 80 Mg Tabs (Simvastatin) .Marland Kitchen.. 1 By Mouth Once Daily 5)  Aspirin 325 Mg Tabs (Aspirin) .Marland Kitchen.. 1 Qd 6)  Isosorbide Dinitrate 20 Mg  Tabs (Isosorbide Dinitrate) .... Tid 7)  Furosemide 20 Mg  Tabs (Furosemide) .... 2 By Mouth Once Daily 8)  Epipen 0.3 Mg/0.60ml (1:1000)  Devi (Epinephrine Hcl (Anaphylaxis)) .... Use As Directed For Shortness of Breath or Dizziness Associated With Bee or Wasp Sting 9)  Cozaar 50 Mg Tabs (Losartan Potassium) .... Take One Tablet Daily 10)  Aldactone 25 Mg Tabs (Spironolactone) .Marland Kitchen.. 1 By Mouth Once Daily Per Dr Glennon Mac  Allergies (verified): No Known Drug Allergies  Past History:  Past Medical History: Last updated: 05/10/Adam non-obstructive cardiomyopathy EF 20%-[place]%, normal coronaries from Cath 2006;  ; ECHO 01/2007 EF 25% -[place]%;  prolonged QT  Glucose intolerance--pre diabetes. Obstructive sleep apnea      - AHI 40      - CPAP 18 cm H2O HTN Obesity renal  insufficiency--worsens w ACE AICD-Medtronic Maximo [id number]  Past Surgical History: Last updated: 01/19/Adam implantation AICD 03/2007 (Dr Shahan Starks)-Medtronic Maximo [phone] Cardiologist [name] ( general cardiology) and Dr Caryl Comes (folllows AICD) Right knee surgery  Family History: Last updated: 02/18/2007 mom: 49, living, Alzheimers, Type II DM father: deceased at 22, MI, CVA sister, deceased at 37 from cancer, heart diaseas sister 90, CHF, diabetes  Social History: Last updated: 01/17/Adam lives in [place] with 2 children. He is separated from his wife.  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 (stopped as of 8/09)  Vital Signs:  Patient profile:   50 year old male Height:      67 inches Weight:      316 pounds BMI:     49.67 Pulse rate:   70 / minute Pulse rhythm:   regular BP sitting:   138 / 84  (right arm) Cuff size:   large  Vitals Entered By: [name] CMA ([name], [name])   Physical Exam  General:  morbidly obese African American male in no acute distress   Head:  normal HEENT Lungs:  clear Heart:  regular rate and rhythm Abdomen:  protuberant but soft Msk:  normal gait  and without Adam arthropathy Extremities:  trace peripheral edema Neurologic:  alert and oriented and grossly normal motor and sensory function Skin:  warm and dry    ICD Specifications Following MD:  [name], MD     ICD Vendor:  Medtronic     ICD Model Number:  [id number]     ICD Serial Number:  [id number] ICD DOI:  02/19/2007     ICD Implanting MD:  [name], MD  Lead 1:    Location: RV     DOI: 02/19/2007     Model #: [id number]     Serial #: [id number]     Status: active  Indications::  ICM   ICD Follow Up Battery Voltage:  3.11 V     Charge Time:  7.83 seconds     Underlying rhythm:  SR ICD Dependent:  No       ICD  Device Measurements Right Ventricle:  Impedance: 400 ohms, Threshold: 1.0 V at 0.2 msec Shock Impedance: 44/55 ohms   Episodes MS Episodes:  0     Coumadin:  No Shock:  0     ATP:  0     Nonsustained:  4     Atrial Therapies:  0 Atrial Pacing:  <0.1%      Brady Parameters Mode VVI     Lower Rate Limit:  40      Tachy Zones VF:  200     VT:  231 FVT VIA VF     VT1:  150 (MONITOR)     Next Cardiology Appt Due:  12/01/Adam Tech Comments:  4 MONITORED VT EPISODES--HR"s 150 AND 154.  NORMAL DEVICE FUNCTION.  NO CHANGES MADE. ROV IN 3 MTHS W/DEVICE CLINIC. [name]  [name], Adam 12:18 PM   Impression & Recommendations:  Problem # 1:  CARDIOMYOPATHY, PRIMARY, DILATED (ICD-425.4) stable on current medications followed by Dr. Terrence Dupont  Problem # 2:  CHRONIC SYSTOLIC HEART FAILURE (123456) stable on current meds  Problem # 3:  IMPLANTATION OF DEFIBRILLATOR, MEDTRONIC MAXIMO 7232 (ICD-V45.02) Device parameters and data were reviewed and no changes were made  Problem # 4:  OBSTRUCTIVE SLEEP APNEA (ICD-327.23) I encouraged him to followup with Dr. Lujean Rave again about mask alternatives; he has not had good success on interacting with the company himself.  Patient Instructions: 1)  Your physician recommends that you schedule a follow-up appointment in: [place]

## 2010-05-09 NOTE — Letter (Signed)
Summary: LAB Letter  [facility]  [address]   [place], [place] [address]   Phone: [phone]  Fax: [phone]    03/17/2008  [name] [address] [place], [place]  [address]  Dear [name],  Your A1C was 6.6 which is AWESOME! Great work! All of  your other labs were normal as well including glucose, liver and kidney function and electrolytes. Have a Happy Holiday!        Sincerely,   [name] MD [facility]  Appended Document: LAB Letter mailed

## 2010-05-09 NOTE — Progress Notes (Signed)
  Medications Added SIMVASTATIN 20 MG TABS (SIMVASTATIN) qd       Phone Note Outgoing Call   Call placed by: rhonda Call placed to: Patient Details for Reason: decrease simvastatin since taking amlodipine. Summary of Call: spoke w/mrs. Zapien who will cut the simvastatin in half so that pt will take 20mg  instead of 40 due to  taking amlodipine.     New/Updated Medications: SIMVASTATIN 20 MG TABS (SIMVASTATIN) qd

## 2010-05-09 NOTE — Miscellaneous (Signed)
   Clinical Lists Changes  Orders: Added new Test order of Basic Met[facility] [id number]) - Signed      Complete Medication List: 1)  Coreg 25 Mg Tabs (Carvedilol) .... One tab by mouth bid 2)  Hydralazine Hcl 50 Mg Tabs (Hydralazine hcl) .... Three times a day 3)  Norvasc 10 Mg Tabs (Amlodipine besylate) .Marland Kitchen.. 1 by mouth once daily 4)  Aspirin 325 Mg Tabs (Aspirin) .... Take 1 tablet by mouth once daily 5)  Isosorbide Dinitrate 20 Mg Tabs (Isosorbide dinitrate) .... Take 1 tablet by mouth three times a day 6)  Furosemide 20 Mg Tabs (Furosemide) .... 2 by mouth once daily 7)  Cozaar 50 Mg Tabs (Losartan potassium) .... Take one tablet daily 8)  Aldactone 25 Mg Tabs (Spironolactone) .Marland Kitchen.. 1 by mouth once daily per dr Glennon Mac 9)  Simvastatin 40 Mg Tabs (Simvastatin) .... Once daily

## 2010-05-09 NOTE — Progress Notes (Signed)
Summary: Triage  Phone Note Call from Patient Call back at Home Phone [phone]   Summary of Call: c/o coughing still since last visit, wants to know if he should be seen Initial call taken by: Drucie Ip,  January 20, 2008 4:17 PM  Follow-up for Phone Call        states he got his flu shot last week and has been coughing ever since. now he is having some blood in sputum. states he  had flu like symptoms after getting flu vaccine . those symptoms have improved  but he continues with cough. appointment scheduled tomorrow. Follow-up by: Marcell Barlow RN,  January 20, 2008 4:30 PM

## 2010-05-09 NOTE — Assessment & Plan Note (Signed)
Summary: fu wp   Vital Signs:  Patient Profile:   50 Years Old Male Weight:      280 pounds Temp:     98.2 degrees F Pulse rate:   65 / minute BP sitting:   155 / 102  Pt. in pain?   no  Vitals Entered By: Christen Bame CMA (April 14, 2007 10:36 AM)                  Chief Complaint:  f/u BP.    Past Medical History:    non-obstructive cardiomyopathy EF 20%-30%, normal coronaries from Cath 2006;  ; ECHO 01/2007 EF 25% -30%;     prolonged QT     DM 2    OSA    HTN    Obesity    renal insufficiency--worsens w ACE  Past Surgical History:    implantation AICD 03/2007   Family History:    Reviewed history from 02/18/2007 and no changes required:       mom: 29, living, Alzheimers, Type II DM       father: deceased at 85, MI, CVA       sister, deceased at 31 from cancer, heart diaseas       sister 51, CHF, diabetes  Social History:    lives in [place] with wife and 2 children. Working as Development worker, international aid and applying for disability.( has medicaid now 04/2007)     No tobacco or alcohol use    Walks regularly--about 5 days a week--can do 1-3 miles at about 20 -25 minutes per mil.   Risk Factors:  Tobacco use:  never Alcohol use:  no Exercise:  yes    Physical Exam  General:     Obesealert and well-developed.   Eyes:     non icteric perrla Mouth:     pharynx pink and moist.   Neck:     supple and full ROM.   Lungs:     normal respiratory effort.  decreased breath sounds but audible throughout all lung fields. Faint scattered dry crackle, no rales or wheezes Heart:     regular rhythm, no murmur, and no gallop.   Abdomen:     obesesoft, non-tender, and normal bowel sounds.   Msk:     normal ROM.   Extremities:     no edema in periphery noted Neurologic:     alert & oriented X3.   Additional Exam:     AICD site healing well left chest    Impression & Recommendations:  Problem # 1:  CHRONIC SYSTOLIC HEART FAILURE (123456) Assessment:  Improved  His updated medication list for this problem includes:    Coreg 25 Mg Tabs (Carvedilol) ..... One tab by mouth bid    Lisinopril 2.5 Mg Tabs (Lisinopril) ..... One tab by mouth qday    Aspirin 325 Mg Tabs (Aspirin) .Marland Kitchen... 1 qd  Orders: Comp Met[facility] [phone]) Lipid-[facility] [phone]) B Nat Peptide-[facility] [phone]) [place]- Est Level  3 [id number])  he has been off his bidil (due to cost) and is doing remakably well!! He walked 3 miles the other day w no SOB or fatigue. For now, I will increase his norvasc and hold the bidil. Long discussion re his monitoring of sx--he does not own a scale. rtc 4-6 w and immediately in interim w fluid overload etc.   Complete Medication List: 1)  Coreg 25 Mg Tabs (Carvedilol) .... One tab by mouth bid 2)  Lisinopril 2.5 Mg Tabs (Lisinopril) .... One  tab by mouth qday 3)  Hydralazine Hcl 50 Mg Tabs (Hydralazine hcl) .... Hold 4)  Norvasc 10 Mg Tabs (Amlodipine besylate) .Marland Kitchen.. 1 by mouth once daily 5)  Pravastatin Sodium 80 Mg Tabs (Pravastatin sodium) .... One tab by mouth qhs 6)  Aspirin 325 Mg Tabs (Aspirin) .Marland Kitchen.. 1 qd 7)  Isosorbide Dinitrate 20 Mg Tabs (Isosorbide dinitrate) .... Hold   Patient Instructions: 1)  f/u in 6 weeks  2)  ok dbl book    Prescriptions: NORVASC 10 MG  TABS (AMLODIPINE BESYLATE) 1 by mouth once daily  #90 x 3   Entered and Authorized by:   [name] MD   Signed by:   [name] MD on 04/14/2007   Method used:   Electronically sent to ...       [place], [place]  [address]       Ph: [phone]       Fax: [phone]   RxID:   [id number]  ]  Appended Document: fu wp      History of Present Illness: pt has been feeling well. received medicaid and has questions about that has  not been on  bis bidil because of price--has had no increase in his baseline sob---feeling better than long time. Can walk 1-3 miles at about 20-25 min per mile--does this several times  a week if weather good 9walks to his [place]). Does not have a scale at his house so cannot weigh regularly.  not notticing any lower extremity wswelling  Trying to eat better diet.          Complete Medication List: 1)  Coreg 25 Mg Tabs (Carvedilol) .... One tab by mouth bid 2)  Lisinopril 2.5 Mg Tabs (Lisinopril) .... One tab by mouth qday 3)  Hydralazine Hcl 50 Mg Tabs (Hydralazine hcl) .... Hold 4)  Norvasc 10 Mg Tabs (Amlodipine besylate) .Marland Kitchen.. 1 by mouth once daily 5)  Pravastatin Sodium 80 Mg Tabs (Pravastatin sodium) .... One tab by mouth qhs 6)  Aspirin 325 Mg Tabs (Aspirin) .Marland Kitchen.. 1 qd 7)  Isosorbide Dinitrate 20 Mg Tabs (Isosorbide dinitrate) .... Hold     ]

## 2010-05-09 NOTE — Assessment & Plan Note (Signed)
Summary: rov/jd   Visit Type:  Follow-up Copy to:  Dr. [name], Dr. Charolette Forward Primary Provider/Referring Provider:  [name] MD  CC:  OSA.  The patient says he is sleeping with cpap approx 4-5 hours every night. He wakes up and the mask is not on his face. He does feel less tired during the day.[name]  History of Present Illness: 50 yo male with OSA on CPAP 18 cm.  He had his titration study from May 25, 2009.  He was titrated to CPAP 18 cm with reduction in AHI to 4.  He is using a full face mask.  He goes to bed at 10pm, and sleeps for about 4 to 6 hours with the machine.  He is sleeping better, and feels better during the day.  He does not have to use the bathroom as much at night.  Current Medications (verified): 1)  Coreg 25 Mg Tabs (Carvedilol) .... One Tab By Mouth Bid 2)  Hydralazine Hcl 50 Mg  Tabs (Hydralazine Hcl) .... Three Times A Day 3)  Norvasc 10 Mg  Tabs (Amlodipine Besylate) .[name].. 1 By Mouth Once Daily 4)  Simvastatin 80 Mg Tabs (Simvastatin) .[name].. 1 By Mouth Once Daily 5)  Aspirin 325 Mg Tabs (Aspirin) .[name].. 1 Qd 6)  Isosorbide Dinitrate 20 Mg  Tabs (Isosorbide Dinitrate) .... Tid 7)  Furosemide 20 Mg  Tabs (Furosemide) .... 2 By Mouth Two Times A Day 8)  Epipen 0.3 Mg/0.78ml (1:1000)  Devi (Epinephrine Hcl (Anaphylaxis)) .... Use As Directed For Shortness of Breath or Dizziness Associated With Bee or Wasp Sting 9)  Cozaar 50 Mg Tabs (Losartan Potassium) .... Take One Tablet Daily 10)  Aldactone 25 Mg Tabs (Spironolactone) .[name].. 1 By Mouth Once Daily Per Dr Glennon Mac  Allergies (verified): No Known Drug Allergies  Past History:  Past Medical History: non-obstructive cardiomyopathy EF 20%-30%, normal coronaries from Cath 2006;  ; ECHO 01/2007 EF 25% -30%;  prolonged QT  Glucose intolerance--pre diabetes. Obstructive sleep apnea      - AHI 40      - CPAP 18 cm H2O HTN Obesity renal insufficiency--worsens w ACE AICD-[employer] Maximo 7232  Past  Surgical History: Reviewed history from 04/25/2009 and no changes required. implantation AICD 03/2007 (Dr Klein)-[employer] Maximo [phone] Cardiologist [name] ( general cardiology) and Dr Caryl Comes (folllows AICD) Right knee surgery  Vital Signs:  Patient profile:   50 year old male Height:      67 inches (170.18 cm) Weight:      303 pounds (137.73 kg) BMI:     47.63 O2 Sat:      94 % on Room air Temp:     97.8 degrees F (36.56 degrees C) oral Pulse rate:   81 / minute BP sitting:   132 / 84  (right arm) Cuff size:   large  Vitals Entered By: Francesca Jewett CMA (Aug 14, 2009 10:16 AM)  O2 Sat at Rest %:  94 O2 Flow:  Room air CC: OSA.  The patient says he is sleeping with cpap approx 4-5 hours every night. He wakes up and the mask is not on his face. He does feel less tired during the day. Comments Medications reviewed. Daytime phone number verified. Francesca Jewett CMA  Aug 14, 2009 10:17 AM   Physical Exam  General:  obese.   Nose:  no deformity, discharge, inflammation, or lesions Mouth:  MP 4, enlarged tongue, no oral lesions Neck:  no JVD.   Lungs:  diminished  breath sounds, no wheezing or rales Heart:  normal rate, regular rhythm, and no murmur.   Extremities:  minimal ankle edema, no cyanosis or clubbing   Impression & Recommendations:  Problem # 1:  OBSTRUCTIVE SLEEP APNEA (ICD-327.23)  He has done well with CPAP 18 cm H2O.  Emphasized the need for weight loss.  Complete Medication List: 1)  Coreg 25 Mg Tabs (Carvedilol) .... One tab by mouth bid 2)  Hydralazine Hcl 50 Mg Tabs (Hydralazine hcl) .... Three times a day 3)  Norvasc 10 Mg Tabs (Amlodipine besylate) .[name].. 1 by mouth once daily 4)  Simvastatin 80 Mg Tabs (Simvastatin) .[name].. 1 by mouth once daily 5)  Aspirin 325 Mg Tabs (Aspirin) .[name].. 1 qd 6)  Isosorbide Dinitrate 20 Mg Tabs (Isosorbide dinitrate) .... Tid 7)  Furosemide 20 Mg Tabs (Furosemide) .... 2 by mouth two times a day 8)  Epipen 0.3 Mg/0.73ml (1:1000)  Devi (Epinephrine hcl (anaphylaxis)) .... Use as directed for shortness of breath or dizziness associated with bee or wasp sting 9)  Cozaar 50 Mg Tabs (Losartan potassium) .... Take one tablet daily 10)  Aldactone 25 Mg Tabs (Spironolactone) .[name].. 1 by mouth once daily per dr Glennon Mac  Other Orders: Est. Patient Level III [id number])  Patient Instructions: 1)  follow up in 6 months

## 2010-05-09 NOTE — Progress Notes (Signed)
Summary: triage  Medications Added TUSSIONEX PENNKINETIC ER 8-10 MG/5ML LQCR (CHLORPHENIRAMINE-HYDROCODONE) 5 cc two times a day by mouth as needed cough dispense 90 cc       Phone Note Call from Patient Call back at [phone]   Caller: Patient Summary of Call: pt was seen for cough and not any better.  Asked to see Dr. Nori Riis , but no availability. Initial call taken by: Raymond Gurney,  January 17, 2009 10:15 AM  Follow-up for Phone Call        wife really wants pcp. told her no availability. she agreed to see Dr. Oneal Grout tomorrow. she cannot get him here today. he has tried OTC w/o relief. wants the cough meds he had in august. message to pcp Follow-up by: Elige Radon RN,  January 17, 2009 10:18 AM  Additional Follow-up for Phone Call Additional follow up Details #1::        Sherrilee Gilles to call in his cough syrup as below and then he can either keep appt tomorrow or put him in to see me next week--ok dbl book Thanks!  [name] MD  January 17, 2009 11:34 AM     Additional Follow-up for Phone Call Additional follow up Details #2::    called to [facility] on [address] per wife's request & changed appt to next Wed with pcp. cancelled tomorrow's appt Follow-up by: Elige Radon RN,  January 17, 2009 11:47 AM  New/Updated Medications: Cathie Hoops ER 8-10 MG/5ML LQCR (CHLORPHENIRAMINE-HYDROCODONE) 5 cc two times a day by mouth as needed cough dispense 90 cc Prescriptions: TUSSIONEX PENNKINETIC ER 8-10 MG/5ML LQCR (CHLORPHENIRAMINE-HYDROCODONE) 5 cc two times a day by mouth as needed cough dispense 90 cc  #90 x 0   Entered and Authorized by:   [name] MD   Signed by:   [name] MD on 01/17/2009   Method used:   Telephoned to ...       [id number]       [address]       [place], [place]  [address]       Ph: [phone]       Fax: [phone]   RxID:   [id number]

## 2010-05-09 NOTE — Letter (Signed)
Summary: LAB Letter  [facility]  [address]   [place], [place] [address]   Phone: [phone]  Fax: [phone]    08/03/2009  [name] [address] [place], [place]  [address]  Dear [name],   Your LDL cholesterol is 90 which is perfect. All of your other lab work was normal. [place]!        Sincerely,   [name] MD  Appended Document: LAB Letter mailed.

## 2010-05-09 NOTE — Progress Notes (Signed)
Summary: [facility]sc nos appt  Phone Note Call from Patient   Caller: [email or web address]  Call For: Finas Delone Summary of Call: [facility] nos from 5/2 to 5/10 @ 10:15a. Initial call taken by: Netta Neat,  Aug 07, 2009 10:00 AM

## 2010-05-09 NOTE — Progress Notes (Signed)
   Phone Note Outgoing Call   Call placed by: Barnett Abu, RN, BSN,  April 05, 2009 7:55 AM Call placed to: Patient Summary of Call: Pt aware of Positive sleep study results. Referred to Dr. Halford Chessman. Initial call taken by: Barnett Abu, RN, BSN,  April 05, 2009 8:14 AM

## 2010-05-09 NOTE — Letter (Signed)
Summary: LAB Letter  [facility]  [address]   [place], [place] [address]   Phone: [phone]  Fax: [phone]    12/14/2007  ORLANDA MANUEL [address] [place], [place]  [address]  Dear Mr. MCKINNY,  Your kidney function is essentially stable. I will probably want to check this every three months or so just like your A1C. All of your electrolytes were normal as well.         Sincerely,   [name] MD [facility]

## 2010-05-09 NOTE — Assessment & Plan Note (Signed)
Summary: f/u wp   Vital Signs:  Patient Profile:   50 Years Old Male Weight:      243 pounds Temp:     98.4 degrees F Pulse rate:   70 / minute BP sitting:   130 / 76  Vitals Entered By: Christen Bame CMA (November 17, 2007 8:31 AM)                TD Result Date:  11/17/2007 TD Result:  given TD Next Due:  10 yr had pneumovax when he was in hospital 2008   Chief Complaint:  f/u.  History of Present Illness: f/u heart failure, non ischemic. Has implantable defibrillator and has had no shocks from it. Has gained weight because he is no longer doing his regular walking. Just sort of got out of habit. Was not having exercise intolerance.  Has had no chest pains, no increase in his baseline SOB although he has been less active.  He has also slipped a little off his diet, but still being salt smart.  reports he has had several SOB episodes if he gets stung by bees. SOB usually self resolves--he takes a shower and has to lie down because hwe gets dizzy. Has never gone to hiospital with this. Has had maybe 3-4 episodes in his life--they are all about the same--no worsening of sx over time.  No ankle swelling unless he sits for a long time.  Not doing daily weights    Past Medical History:    Reviewed history from 04/14/2007 and no changes required:       non-obstructive cardiomyopathy EF 20%-30%, normal coronaries from Cath 2006;  ; ECHO 01/2007 EF 25% -30%;        prolonged QT        DM 2       OSA       HTN       Obesity       renal insufficiency--worsens w ACE  Past Surgical History:    Reviewed history from 06/16/2007 and no changes required:       implantation AICD 03/2007 (Dr Caryl Comes)   Social History:    lives in [place] with wife and 2 children. 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 (stopped as of  8/09)    Review of Systems  The patient denies fever, weight loss, chest pain, syncope, prolonged cough, headaches, abdominal pain, muscle weakness, difficulty walking, and depression.     Physical Exam  General:     overweight-appearing.   Neck:     supple, full ROM, and no masses.  no carotid bruits, no thyromegally Lungs:     normal respiratory effort, no accessory muscle use, normal breath sounds, and no wheezes.   Heart:     normal rate, regular rhythm, and no murmur.  distant heart sounds Abdomen:     soft, non-tender, and normal bowel sounds.  Obese Msk:     normal ROM and no joint tenderness.   Extremities:     no edema at ankles Neurologic:     alert & oriented X3 and gait normal.   Psych:     Oriented X3, normally interactive, good eye contact, not anxious appearing, and not depressed appearing.      Impression & Recommendations:  Problem #  1:  CHRONIC SYSTOLIC HEART FAILURE (123456) Assessment: Unchanged  His updated medication list for this problem includes:    Coreg 25 Mg Tabs (Carvedilol) ..... One tab by mouth bid    Lisinopril 10 Mg Tabs (Lisinopril) ..... Once daily    Aspirin 325 Mg Tabs (Aspirin) .Marland Kitchen... 1 qd    Furosemide 20 Mg Tabs (Furosemide) .Marland Kitchen... 1 by mouth once daily  Orders: [place]- Est  Level 4 [id number]) he seems baseline symptomatically but we discussed his absolute need to get back to daily exercise--he agrees to start tomorrow.   Problem # 2:  HYPERTENSION, BENIGN (ICD-401.1) Assessment: Unchanged  His updated medication list for this problem includes:    Coreg 25 Mg Tabs (Carvedilol) ..... One tab by mouth bid    Lisinopril 10 Mg Tabs (Lisinopril) ..... Once daily    Hydralazine Hcl 50 Mg Tabs (Hydralazine hcl) .Marland Kitchen... Three times a day    Norvasc 10 Mg Tabs (Amlodipine besylate) .Marland Kitchen... 1 by mouth once daily    Furosemide 20 Mg Tabs (Furosemide) .Marland Kitchen... 1 by mouth once daily  Orders: [facility]- Est  Level 4 [id number])  Future Orders: Comp  Met-[facility] [id number]) ... 12/07/2007 well controlled right now.   Problem # 3:  OTHER ABNORMAL GLUCOSE (ICD-790.29)  Orders: [facility]- Est  Level 4 [id number])  Future Orders: A1C-[facility] [id number]) ... 12/07/2007 with his weight gain I want to recheck an A1C--will do as future order for 3 weeks and can check his kidney function etc then as well. will try to get his lab draws and ov on same schedule so wil see him back 4 months  Problem # 4:  STING HORNETS WASPS&BEES CAUSE POISN&TOX REACT (ICD-E905.3) Assessment: New we discussed at some length. I am alittle afraid of an epi pen in him because of his heart disease so I asked him to use it ONLY if he is having significant SOB or dizziness, not to use just if he gets stung. Discussed emergency precautions about when to seek medical care beyond epi pen.  Problem # 5:  Preventive Health Care (ICD-V70.0) updated his Td today  Complete Medication List: 1)  Coreg 25 Mg Tabs (Carvedilol) .... One tab by mouth bid 2)  Lisinopril 10 Mg Tabs (Lisinopril) .... Once daily 3)  Hydralazine Hcl 50 Mg Tabs (Hydralazine hcl) .... Three times a day 4)  Norvasc 10 Mg Tabs (Amlodipine besylate) .Marland Kitchen.. 1 by mouth once daily 5)  Pravastatin Sodium 80 Mg Tabs (Pravastatin sodium) .... One tab by mouth qhs 6)  Aspirin 325 Mg Tabs (Aspirin) .Marland Kitchen.. 1 qd 7)  Isosorbide Dinitrate 20 Mg Tabs (Isosorbide dinitrate) .... Tid 8)  Furosemide 20 Mg Tabs (Furosemide) .Marland Kitchen.. 1 by mouth once daily 9)  Epipen 0.3 Mg/0.80ml (1:1000) Devi (Epinephrine hcl (anaphylaxis)) .... Use as directed for shortness of breath or dizziness associated with bee or wasp sting   Patient Instructions: 1)  I am sending a prescription for an epi pen to [facility]. 2)  Please come for a lab only in about 3 weeks--you do not need to fast. I will see you back in 4 months, sooner with problems. 3)  RESTART your EXERCISE--your dog will appreciate it too.   Prescriptions: EPIPEN 0.3 MG/0.3ML (1:1000)  DEVI  (EPINEPHRINE HCL (ANAPHYLAXIS)) use as directed for shortness of breath or dizziness associated with bee or wasp sting  #1 x 1   Entered and Authorized by:   [name] MD   Signed by:   [name] MD on 11/17/2007  Method used:   Electronically sent to ...       [place], [place]  [address]       Ph: [phone]       Fax: [phone]   RxID:   [id number]  ]  Appended Document: tetanus    Clinical Lists Changes  Orders: Added new Service order of Tdap => 57yrs IM [id number]) - Signed Added new Service order of Admin 1st Vaccine [phone]) - Signed Observations: Added new observation of TD BOOST VIS: 02/23/07 version given November 17, 2007. (11/17/2007 9:14) Added new observation of TD BOOSTERLO: [id number] (11/17/2007 9:14) Added new observation of TD BOOST EXP: 07/06/2009 (11/17/2007 9:14) Added new observation of TD BOOSTERBY: JESSICA FLEEGER CMA (11/17/2007 9:14) Added new observation of TD BOOSTERRT: IM (11/17/2007 9:14) Added new observation of TDBOOSTERDSE: 0.5 ml (11/17/2007 9:14) Added new observation of TD BOOSTERMF: [place] (11/17/2007 9:14) Added new observation of TD BOOST SIT: right deltoid (11/17/2007 9:14) Added new observation of TD BOOSTER: Tdap (11/17/2007 9:14)        Tetanus/Td Vaccine    Vaccine Type: Tdap    Site: right deltoid    Mfr: Sanofi Pasteur    Dose: 0.5 ml    Route: IM    Given by: JESSICA FLEEGER CMA    Exp. Date: 07/06/2009    Lot #: [id number]    VIS given: 02/23/07 version given November 17, 2007.

## 2010-05-09 NOTE — Miscellaneous (Signed)
Summary: CPAP titraton 05/25/09   Clinical Lists Changes CPAP 18 cm with AHI down to 4.  Observed in REM, but not supine.  Had frequent central events at lower pressures.  Will have my nurse call to inform patient that CPAP study looked good.  Will start CPAP 18 cm H2O, and schedule for ROV in 6 to 8 weeks after set up. Orders: Added new Referral order of DME Referral (DME) - Signed  Appended Document: CPAP titraton 05/25/09 ATC pt phone # disconnected could not find another # for pt  Appended Document: CPAP titraton 05/25/09 LM with pt's brother, Thereasa Solo. He will have the patient call back.  Appended Document: CPAP titraton 05/25/09 The patient had verbal understanding regarding CPAP set up and says he has not heard from anyone at [facility] yet. I have scheduled his ROV with [facility] on 08/06/09. I will forward this to the PCC's so they may f/u with [facility] on getting this pt's CPAP.

## 2010-05-09 NOTE — Assessment & Plan Note (Signed)
Summary: shoulder pain,df   Vital Signs:  Patient profile:   50 year old male Weight:      319.9 pounds Temp:     98.7 degrees F oral Pulse rate:   66 / minute Pulse rhythm:   regular BP sitting:   114 / 68  (right arm) Cuff size:   large  Vitals Entered By: Audelia Hives CMA (January 09, 2010 8:37 AM) Pain Assessment Patient in pain? yes     Location: shoulder Intensity: 9 Type: numbness   Primary Care Provider:  [name] MD   History of Present Illness: left shoulder pain--improved after last shot--so he did not go to ortho appt. Then last month it started hurting again. he accidentally ran into the corner of a door and that really seemed to make it much worse. pain is with reaching backward or trying to layon that side. Pain radiates from anterior sholder down into left bicep. No numbness or tingling in hands.  Habits & Providers  Alcohol-Tobacco-Diet     Alcohol drinks/day: 0     Tobacco Status: never  Current Medications (verified): 1)  Coreg 25 Mg Tabs (Carvedilol) .... One Tab By Mouth Bid 2)  Hydralazine Hcl 50 Mg  Tabs (Hydralazine Hcl) .... Three Times A Day 3)  Norvasc 10 Mg  Tabs (Amlodipine Besylate) .Marland Kitchen.. 1 By Mouth Once Daily 4)  Simvastatin 80 Mg Tabs (Simvastatin) .Marland Kitchen.. 1 By Mouth Once Daily 5)  Aspirin 325 Mg Tabs (Aspirin) .Marland Kitchen.. 1 Qd 6)  Isosorbide Dinitrate 20 Mg  Tabs (Isosorbide Dinitrate) .... Tid 7)  Furosemide 20 Mg  Tabs (Furosemide) .... 2 By Mouth Once Daily 8)  Epipen 0.3 Mg/0.40ml (1:1000)  Devi (Epinephrine Hcl (Anaphylaxis)) .... Use As Directed For Shortness of Breath or Dizziness Associated With Bee or Wasp Sting 9)  Cozaar 50 Mg Tabs (Losartan Potassium) .... Take One Tablet Daily 10)  Aldactone 25 Mg Tabs (Spironolactone) .Marland Kitchen.. 1 By Mouth Once Daily Per Dr Glennon Mac  Allergies: No Known Drug Allergies  Review of Systems       Please see HPI for additional ROS.   Physical Exam  General:  alert.  obese Msk:  left shoulder FROM.  Rotator cuff musculature is intact strength and essentially without pain in all planes of function. He does jhvae  osme anterior shoulder pain when he stretches his upper arm backkward.  TTP left bicep tendon and this reproduces his pain. distally neurovascularly intact Additional Exam:  Patient given informed consent for injection. Discussed possible complications of infection, bleeding or skin atrophy at site of injection. Possible side effect of avascular necrosis (focal area of bone death) due to steroid use.Appropriate verbal time out taken Are cleaned and prepped in usual sterile fashion. A ---1/2- cc kennalog plus -3---cc 1% lidocaine without epinephrine was injected into the-tendon sheath of teh bicep tendon.-. Patient tolerated procedure well with no complications.    Impression & Recommendations:  Problem # 1:  SHOULDER PAIN, LEFT (ICD-719.41)  Orders: [place]- Est Level  3 [id number]) Injection, large joint- [place] [id number]) bicep tendonitis. rehab program with bicep surls discussed and he wil start hthat in 3 days. emphasis in light weigts, high reps. If t his does not improve, we will reschedule his ortho appt  Problem # 2:  CHRONIC KIDNEY DISEASE UNSPECIFIED (ICD-585.9)  Orders: Basic Met-[facility] [id number]) labs for DM, CKD etc  Complete Medication List: 1)  Coreg 25 Mg Tabs (Carvedilol) .... One tab by mouth bid 2)  Hydralazine Hcl 50  Mg Tabs (Hydralazine hcl) .... Three times a day 3)  Norvasc 10 Mg Tabs (Amlodipine besylate) .Marland Kitchen.. 1 by mouth once daily 4)  Simvastatin 80 Mg Tabs (Simvastatin) .Marland Kitchen.. 1 by mouth once daily 5)  Aspirin 325 Mg Tabs (Aspirin) .Marland Kitchen.. 1 qd 6)  Isosorbide Dinitrate 20 Mg Tabs (Isosorbide dinitrate) .... Tid 7)  Furosemide 20 Mg Tabs (Furosemide) .... 2 by mouth once daily 8)  Epipen 0.3 Mg/0.30ml (1:1000) Devi (Epinephrine hcl (anaphylaxis)) .... Use as directed for shortness of breath or dizziness associated with bee or wasp sting 9)  Cozaar 50 Mg Tabs (Losartan  potassium) .... Take one tablet daily 10)  Aldactone 25 Mg Tabs (Spironolactone) .Marland Kitchen.. 1 by mouth once daily per dr Glennon Mac  Other Orders: A1C-[facility] [id number])  Laboratory Results   Blood Tests   Date/Time Received: January 09, 2010 9:20 AM  Date/Time Reported: January 09, 2010 10:00 AM   HGBA1C: 7.4%   (Normal Range: Non-Diabetic - 3-6%   Control Diabetic - 6-8%)  Comments: ...............test performed by......Marland KitchenBonnie A. Martinique, MLS (ASCP)cm

## 2010-05-09 NOTE — Progress Notes (Signed)
Summary: Rx unaffordable  Phone Note Call from Patient Call back at Home Phone [phone]   Caller: Wife Summary of Call: Is unable to afford rx for bidil, wants to know if we can call something else in that is more affordable. Has an appt on 1/7 with PCP.  Initial call taken by: Drucie Ip,  April 02, 2007 3:14 PM  Follow-up for Phone Call        I will call him in 2 medicines that together are the equivalent of Bidil. Isosorbide and hydralazine..will send to pharmacy please inform patient Follow-up by: Kaylyn Layer MD,  April 05, 2007 8:21 AM  Additional Follow-up for Phone Call Additional follow up Details #1::        wife called back. told her to check with [facility] to see what the pricing was on the 2 separate meds Additional Follow-up by: Elige Radon RN,  April 05, 2007 9:11 AM    New/Updated Medications: HYDRALAZINE HCL 50 MG  TABS (HYDRALAZINE HCL) one tab by mouth tid ISOSORBIDE DINITRATE 20 MG  TABS (ISOSORBIDE DINITRATE) one tab by mouth tid   Prescriptions: ISOSORBIDE DINITRATE 20 MG  TABS (ISOSORBIDE DINITRATE) one tab by mouth tid  #270 x 0   Entered and Authorized by:   Kaylyn Layer MD   Signed by:   Kaylyn Layer MD on 04/05/2007   Method used:   Electronically sent to ...       [facility] [place], [place]  [address]       Ph: [phone]       Fax: [phone]   RxID:   [id number] HYDRALAZINE HCL 50 MG  TABS (HYDRALAZINE HCL) one tab by mouth tid  #270 x 0   Entered and Authorized by:   Kaylyn Layer MD   Signed by:   Kaylyn Layer MD on 04/05/2007   Method used:   Electronically sent to ...       [place],   [address]       Ph: [phone]       Fax: [phone]   RxID:   [id number]

## 2010-05-09 NOTE — Progress Notes (Signed)
Summary: phn msgf   Phone Note Call from Patient Call back at [phone]   Caller: spouse-[name] Summary of Call: having catarac surgery and was told to stop the aspirin 10 days before -wants to know if that is OK Initial call taken by: Audie Clear,  March 25, 2010 9:30 AM  Follow-up for Phone Call        [place] tell him yes Ok restart aspirin 48 hiours after surgery  Thanks!  [name] MD  March 25, 2010 10:52 AM   Additional Follow-up for Phone Call Additional follow up Details #1::        spoke with pt's wife and told her that he can continue asa 48 hours after surgery. Additional Follow-up by: Audelia Hives CMA,  March 25, 2010 12:32 PM

## 2010-05-09 NOTE — Miscellaneous (Signed)
Summary: ORTHO REFERRAL  noted and will schedule appointment. Marcell Barlow RN  October 29, 2009 11:26 AM  Clinical Lists Changes  Orders: Added new Referral order of Orthopedic Referral (Ortho) - Signed      Complete Medication List: 1)  Coreg 25 Mg Tabs (Carvedilol) .... One tab by mouth bid 2)  Hydralazine Hcl 50 Mg Tabs (Hydralazine hcl) .... Three times a day 3)  Norvasc 10 Mg Tabs (Amlodipine besylate) .Marland Kitchen.. 1 by mouth once daily 4)  Simvastatin 80 Mg Tabs (Simvastatin) .Marland Kitchen.. 1 by mouth once daily 5)  Aspirin 325 Mg Tabs (Aspirin) .Marland Kitchen.. 1 qd 6)  Isosorbide Dinitrate 20 Mg Tabs (Isosorbide dinitrate) .... Tid 7)  Furosemide 20 Mg Tabs (Furosemide) .... 2 by mouth two times a day 8)  Epipen 0.3 Mg/0.45ml (1:1000) Devi (Epinephrine hcl (anaphylaxis)) .... Use as directed for shortness of breath or dizziness associated with bee or wasp sting 9)  Cozaar 50 Mg Tabs (Losartan potassium) .... Take one tablet daily 10)  Aldactone 25 Mg Tabs (Spironolactone) .Marland Kitchen.. 1 by mouth once daily per dr Glennon Mac

## 2010-05-09 NOTE — Assessment & Plan Note (Signed)
Summary: 62M F/U [facility]   Vital Signs:  Patient profile:   50 year old male Height:      67 inches Weight:      309.7 pounds BMI:     48.68 Temp:     98.5 degrees F oral Pulse rate:   82 / minute BP sitting:   116 / 70  (left arm) Cuff size:   large  Vitals Entered By: Isabelle Course (August 01, 2009 8:47 AM) CC: 3 mos F/U Is Patient Diabetic? No Pain Assessment Patient in pain? no        Primary Care Provider:  [name] MD  CC:  3 mos F/U.  History of Present Illness: Follow up hypertension. Taking medicines regularly with no problems. Not having any any headaches or chest pains. Follow-up hyperlipidemia. Trying to follow a good diet, taking medicines regularly. Not having any problems with medicines, no myalgias and no fatigue. Obesity; has not been walking much---plans to start now that weather is better. Left shoulder has been bothering him 1 m--no specific injury, no new activities> right hand dominant. Pain is in joint, worse w overhead activity, trying to put hand behind back and trying to sleep on t hat side.  Habits & Providers  Alcohol-Tobacco-Diet     Tobacco Status: never  Current Medications (verified): 1)  Coreg 25 Mg Tabs (Carvedilol) .... One Tab By Mouth Bid 2)  Hydralazine Hcl 50 Mg  Tabs (Hydralazine Hcl) .... Three Times A Day 3)  Norvasc 10 Mg  Tabs (Amlodipine Besylate) .Marland Kitchen.. 1 By Mouth Once Daily 4)  Simvastatin 80 Mg Tabs (Simvastatin) .Marland Kitchen.. 1 By Mouth Once Daily 5)  Aspirin 325 Mg Tabs (Aspirin) .Marland Kitchen.. 1 Qd 6)  Isosorbide Dinitrate 20 Mg  Tabs (Isosorbide Dinitrate) .... Tid 7)  Furosemide 20 Mg  Tabs (Furosemide) .... 2 By Mouth Two Times A Day 8)  Epipen 0.3 Mg/0.81ml (1:1000)  Devi (Epinephrine Hcl (Anaphylaxis)) .... Use As Directed For Shortness of Breath or Dizziness Associated With Bee or Wasp Sting 9)  Cozaar 50 Mg Tabs (Losartan Potassium) .... Take One Tablet Daily 10)  Aldactone 25 Mg Tabs (Spironolactone) .Marland Kitchen.. 1 By Mouth Once Daily Per Dr  Glennon Mac  Allergies: No Known Drug Allergies  Review of Systems       doe is baseline  Physical Exam  General:  alert and overweight-appearing.   Neck:  supple, full ROM, no masses, no thyromegaly, and no carotid bruits.   Lungs:  normal respiratory effort and normal breath sounds.   Heart:  normal rate, regular rhythm, no murmur, and no gallop.   Abdomen:  soft.  obese. + bowel sounds Msk:  L shoulder pain w supraspinatus testing and some w IR. Has FROm in ER. neurovascuylarly intact. + [place]. Additional Exam:  Patient given informed consent for injection. Discussed possible complications of infection, bleeding or skin atrophy at site of injection. Possible side effect of avascular necrosis (focal area of bone death) due to steroid use.Appropriate verbal time out taken Are cleaned and prepped in usual sterile fashion. A --1-- cc kennalog plus -3---cc 1% lidocaine without epinephrine was injected into the-left subacromial bursa using posterior approach--. Patient tolerated procedure well with no complications.    Impression & Recommendations:  Problem # 1:  SHOULDER PAIN, LEFT (ICD-719.41)  Orders: Injection, large joint- [place] ([id number])  Problem # 2:  HYPERTENSION, BENIGN (ICD-401.1)  His updated medication list for this problem includes:    Coreg 25 Mg Tabs (Carvedilol) ..... One tab  by mouth bid    Hydralazine Hcl 50 Mg Tabs (Hydralazine hcl) .Marland Kitchen... Three times a day    Norvasc 10 Mg Tabs (Amlodipine besylate) .Marland Kitchen... 1 by mouth once daily    Furosemide 20 Mg Tabs (Furosemide) .Marland Kitchen... 2 by mouth two times a day    Cozaar 50 Mg Tabs (Losartan potassium) .Marland Kitchen... Take one tablet daily    Aldactone 25 Mg Tabs (Spironolactone) .Marland Kitchen... 1 by mouth once daily per dr Glennon Mac  Orders: Comp Met-[facility] [phone]) Direct LDL-[facility] [phone]) [place]- Est  Level 4 [id number]) no med changes check labs as above  Problem # 3:  CARDIOMYOPATHY, PRIMARY, DILATED (ICD-425.4)  Orders: [place]- Est  Level 4  [id number]) encouraged more reg exercise  Complete Medication List: 1)  Coreg 25 Mg Tabs (Carvedilol) .... One tab by mouth bid 2)  Hydralazine Hcl 50 Mg Tabs (Hydralazine hcl) .... Three times a day 3)  Norvasc 10 Mg Tabs (Amlodipine besylate) .Marland Kitchen.. 1 by mouth once daily 4)  Simvastatin 80 Mg Tabs (Simvastatin) .Marland Kitchen.. 1 by mouth once daily 5)  Aspirin 325 Mg Tabs (Aspirin) .Marland Kitchen.. 1 qd 6)  Isosorbide Dinitrate 20 Mg Tabs (Isosorbide dinitrate) .... Tid 7)  Furosemide 20 Mg Tabs (Furosemide) .... 2 by mouth two times a day 8)  Epipen 0.3 Mg/0.32ml (1:1000) Devi (Epinephrine hcl (anaphylaxis)) .... Use as directed for shortness of breath or dizziness associated with bee or wasp sting 9)  Cozaar 50 Mg Tabs (Losartan potassium) .... Take one tablet daily 10)  Aldactone 25 Mg Tabs (Spironolactone) .Marland Kitchen.. 1 by mouth once daily per dr Glennon Mac  Other Orders: PSA-[facility] [phone])  Prevention & Chronic Care Immunizations   Influenza vaccine: Fluvax Non-MCR  (01/10/2008)   Influenza vaccine deferral: Refused  (01/24/2009)    Tetanus booster: 11/17/2007: Tdap   Tetanus booster due: 11/16/2017    Pneumococcal vaccine: Not documented  Colorectal Screening   Hemoccult: Not documented    Colonoscopy: Not documented  Other Screening   PSA: Not documented   PSA ordered.   Smoking status: never  (08/01/2009)  Lipids   Total Cholesterol: 121  (04/14/2007)   LDL: 75  (04/14/2007)   LDL Direct: 92  (01/24/2009)   HDL: 31  (04/14/2007)   Triglycerides: 76  (04/14/2007)  Hypertension   Last Blood Pressure: 116 / 70  (08/01/2009)   Serum creatinine: 1.30  (05/24/2009)   Serum potassium 4.0  (05/24/2009) CMP ordered     Hypertension flowsheet reviewed?: Yes   Progress toward BP goal: At goal  Self-Management Support :   Personal Goals (by the next clinic visit) :      Personal blood pressure goal: 140/90  (01/24/2009)   Hypertension self-management support: Written self-care plan   (01/24/2009)

## 2010-05-09 NOTE — Letter (Signed)
Summary: LAB Letter  [facility]  [address]   [place], [place] [address]   Phone: [phone]  Fax: [phone]    05/25/2009  [name] [address] [place], [place]  [address]  Dear [name],   Thanks for getting the lab work done. It looks OK. I would follow it closely and like to repeat in 2 months. There will be a lab slip waiting for you in mid -April.        Sincerely,   [name] MD

## 2010-05-09 NOTE — Progress Notes (Signed)
Summary: phn msg   Phone Note Call from Patient Call back at [facility] Phone [phone]   Caller: Patient Summary of Call: Pt says he is to have x-ray of his shoulder. Initial call taken by: Raymond Gurney,  Aug 14, 2009 11:47 AM  Follow-up for Phone Call        please return call to [phone].  Was speaking with you Dr Nori Riis and was D/c'ed when you transfered call, unsure what you want him to do .Marland KitchenMarland Kitchenplease asst. Follow-up by: Isabelle Course,  Aug 14, 2009 11:54 AM  Additional Follow-up for Phone Call Additional follow up Details #1::        Per Dr Nori Riis to be set up with apt in Sports med, to admin team to call and sched Additional Follow-up by: Isabelle Course,  Aug 14, 2009 1:45 PM    Additional Follow-up for Phone Call Additional follow up Details #2::    Appointment made at [facility] for Monday May 23 @ 3:15 with Dr. Nori Riis. Follow-up by: Raymond Gurney,  Aug 15, 2009 8:58 AM

## 2010-05-09 NOTE — Progress Notes (Signed)
Summary: phn msg   Phone Note Call from Patient Call back at [phone]   Caller: spouse-Samatha Summary of Call: Pt was seen for shoulder pain and was given inj, but was told to call back if no better and would give a referral. Initial call taken by: Raymond Gurney,  Aug 07, 2009 9:44 AM  Follow-up for Phone Call        spoke with pt aware Dr Nori Riis on vacation, stated he was fine to wait till she returns to office, advised if things change to call back Follow-up by: Isabelle Course,  Aug 07, 2009 9:48 AM  Additional Follow-up for Phone Call Additional follow up Details #1::        [place] ask him if he wants another appt to see what we can do for shoulder? let me know Thanks!  [name] MD  Aug 13, 2009 4:47 PM     Additional Follow-up for Phone Call Additional follow up Details #2::    pt will call and make appt in the am Follow-up by: Audelia Hives CMA,  Aug 13, 2009 4:56 PM

## 2010-05-09 NOTE — Letter (Signed)
Summary: LAB Letter  [facility]  [address]   [place], [place] [address]   Phone: [phone]  Fax: [phone]    12/10/2007  CORTES YUHASZ [address] [place], [place]  [address]  Dear Mr. CRISTINA,  Your A1C is 6.6 OUTSTANDING WORK! Perfect!         Sincerely,   [name] MD [facility]  Appended Document: LAB Letter sent

## 2010-05-09 NOTE — Procedures (Signed)
Summary: device check/sl  Medications Added ASPIRIN 325 MG TABS (ASPIRIN) Take 1 tablet by mouth once daily ISOSORBIDE DINITRATE 20 MG  TABS (ISOSORBIDE DINITRATE) Take 1 tablet by mouth three times a day SIMVASTATIN 80 MG TABS (SIMVASTATIN) Take 1 tablet by mouth once daily      Allergies Added: NKDA  Current Medications (verified): 1)  Coreg 25 Mg Tabs (Carvedilol) .... One Tab By Mouth Bid 2)  Hydralazine Hcl 50 Mg  Tabs (Hydralazine Hcl) .... Three Times A Day 3)  Norvasc 10 Mg  Tabs (Amlodipine Besylate) .Marland Kitchen.. 1 By Mouth Once Daily 4)  Aspirin 325 Mg Tabs (Aspirin) .... Take 1 Tablet By Mouth Once Daily 5)  Isosorbide Dinitrate 20 Mg  Tabs (Isosorbide Dinitrate) .... Take 1 Tablet By Mouth Three Times A Day 6)  Furosemide 20 Mg  Tabs (Furosemide) .... 2 By Mouth Once Daily 7)  Cozaar 50 Mg Tabs (Losartan Potassium) .... Take One Tablet Daily 8)  Aldactone 25 Mg Tabs (Spironolactone) .Marland Kitchen.. 1 By Mouth Once Daily Per Dr Glennon Mac 9)  Simvastatin 80 Mg Tabs (Simvastatin) .... Take 1 Tablet By Mouth Once Daily  Allergies (verified): No Known Drug Allergies   ICD Specifications Following MD:  [name], MD     ICD Vendor:  [employer]     ICD Model Number:  [id number]     ICD Serial Number:  [id number] ICD DOI:  02/19/2007     ICD Implanting MD:  [name], MD  Lead 1:    Location: RV     DOI: 02/19/2007     Model #: [id number]     Serial #: [id number]     Status: active  Indications::  ICM   ICD Follow Up Battery Voltage:  3.07 V     Charge Time:  7.97 seconds     Underlying rhythm:  SR ICD Dependent:  No       ICD Device Measurements Right Ventricle:  Amplitude: 6.9 mV, Impedance: 392 ohms, Threshold: 1.0 V at 0.2 msec Shock Impedance: 44/55 ohms   Episodes MS Episodes:  0     Coumadin:  No Nonsustained:  11     Ventricular Pacing:  <0.1%  Brady Parameters Mode VVI     Lower Rate Limit:  40      Tachy Zones VF:  200     VT:  231 FVT VIA VF     VT1:  150 (MONITOR)     Next  Cardiology Appt Due:  05/08/2010 Tech Comments:  6 MONITORED VT EPISODES--LONGEST WAS 3 MINUTES.  6 NST EPISODES.  NORMAL DEVICE FUNCTION.  INSTALLED LIA AND DEMONSTRATED TONES FOR PT.  ROV IN 3 MTHS W/SK. Shelly Bombard  February 21, 2010 9:43 AM   Patient Instructions: 1)  Schedule office visit with Dr Caryl Comes in 3 mths.     Appended Document: device check/sl adv pt to decrease Simvastatin to 40 mg. He will cut pills in half until he runs out. Joan Flores, RN, BSN    Prescriptions: SIMVASTATIN 40 MG TABS (SIMVASTATIN) once daily  #90 x 3   Entered by:   Joan Flores RN   Authorized by:   Nikki Dom, MD, [facility]   Signed by:   Joan Flores RN on 02/26/2010   Method used:   Electronically to        [facility] [phone]* (retail)       [address]       [place], [place]  [address]  Ph: [phone]       Fax: [phone]   RxIDLI:[id number]

## 2010-05-09 NOTE — Miscellaneous (Signed)
   Clinical Lists Changes  Problems: Changed problem from CHRONIC KIDNEY DISEASE UNSPECIFIED (ICD-585.9) to RENAL DISEASE, CHRONIC, STAGE III (ICD-585.3)  Appended Document: device check/sl adv pt to decrease Simvastatin to 40 mg. He will cut pills in half until he runs out. Joan Flores, RN, BSN    Prescriptions: SIMVASTATIN 40 MG TABS (SIMVASTATIN) once daily  #90 x 3   Entered by:   Joan Flores RN   Authorized by:   Nikki Dom, MD, [facility]   Signed by:   Joan Flores RN on 02/26/2010   Method used:   Electronically to        [facility] [phone]* (retail)       [address]       [place], [place]  [address]       Ph: [phone]       Fax: [phone]   RxID:   [id number]

## 2010-05-09 NOTE — Progress Notes (Signed)
Summary: requesting rx  Phone Note Call from Patient Call back at Home Phone [phone]   Caller: [name]/wife Reason for Call: Talk to Nurse Summary of Call: pts wife sts pt has been taking bidil but was given samples, he needs a new rx sent to his pharmacy, requesting a 90 day supply, pt goes to [facility] Initial call taken by: ERIN LEVAN,  March 29, 2007 3:32 PM  Follow-up for Phone Call        refill sent , please inform patient Follow-up by: Kaylyn Layer MD,  March 29, 2007 4:09 PM  Additional Follow-up for Phone Call Additional follow up Details #1::        Called and informed pt.'s wife that medication has been refilled Additional Follow-up by: ASHA [name] LPN,  December 22, [id number] 4:41 PM      Prescriptions: BIDIL 20-37.5 MG  TABS (ISOSORB DINITRATE-HYDRALAZINE) one tab by mouth tid  #270 x 0   Entered and Authorized by:   Kaylyn Layer MD   Signed by:   Kaylyn Layer MD on 03/29/2007   Method used:   Electronically sent to ...       [place], [place]  [address]       Ph: [phone]       Fax: [phone]   RxID:   [id number]     Appended Document: requesting rx Is unable to afford rx, wants to know if we can call something else in that is more affordable. Has an appt on 1/7 with PCP.  Pts wifes phone number is [phone].

## 2010-05-09 NOTE — Progress Notes (Signed)
Summary: insurance will  not cover night watch   Phone Note Call from Patient Call back at Home Phone [phone]   Caller: Patient Reason for Call: Talk to Nurse Summary of Call: pt calling, insurance will not cover night watch, request call back Initial call taken by: Darnell Level,  March 16, 2009 11:07 AM  Follow-up for Phone Call        I received a message that the pt's insurance denied his Night Watch study we ordered. So, I ask Dr. Caryl Comes if he had any recommendations and he said to send him to see Dr. Elsworth Soho or Dr. Llana Aliment. When [place] called him he wanted to s/w me. He wanted to know why his insurance would not pay. I transferred him to [facility] and he will call me if he decides he wants to see a sleep MD Follow-up by: Barnett Abu, RN, BSN,  March 16, 2009 11:56 AM  Additional Follow-up for Phone Call Additional follow up Details #1::        I s/w Pam in billing and she said that his insurance does in fact pay for night watch monitors. She will call the pt and get with Rod Holler to take care of getting him his monitor.  Additional Follow-up by: Barnett Abu, RN, BSN,  March 16, 2009 12:05 PM

## 2010-05-09 NOTE — Assessment & Plan Note (Signed)
Summary: Hosp. f/u per [facility] (jar)   Vital Signs:  Patient Profile:   50 Years Old Male Weight:      285 pounds Temp:     98.0 degrees F Pulse rate:   55 / minute BP sitting:   154 / 93  Pt. in pain?   no  Vitals Entered By: Christen Bame CMA (February 18, 2007 8:35 AM)                  Chief Complaint:  hospital f/u.  History of Present Illness: S: Patient is a 50 y/o male with nonischemic dilated cardiomyopathy (EF 20%), HTN who was recently hospitalized with hyperkalemia and acute on chronic RF.  The patient improved after we held his diuretics ad stopped potassium supplement and ACE-I and ARB.  THe patient is schedule to have an ICD implanted tomorrow.  Patient is feeling well. No SOB, DOE, CP, PND, orthopneas or edema.  Patient has all his medications with him and is taking them as prescribed and asks appropriate questions.      Past Medical History:    Reviewed history and no changes required:       non-obstructive cardiomyopathy EF 20%-30%, normal coronaries       OSA       HTN       Obesity       CKD   Family History:    Reviewed history and no changes required:       mom: 101, living, Alzheimers, Type II DM       father: deceased at 23, MI, CVA       sister, deceased at 31 from cancer, heart diaseas       sister 68, CHF, diabetes  Social History:    Reviewed history and no changes required:       lives in [place] with wife and 2 children. Working as Development worker, international aid and applying for disability. No tobacco or alcohol use     Physical Exam  General:     Well-developed,well-nourished,in no acute distress; alert,appropriate and cooperative throughout examination. overweight-appearing.   Head:     normocephalic and atraumatic.   Eyes:     vision grossly intact, pupils equal, pupils round, and pupils reactive to light.   Mouth:     Oral mucosa and oropharynx without lesions or exudates.  Teeth in good repair. Lungs:     Normal respiratory effort, chest  expands symmetrically. Lungs are clear to auscultation, no crackles or wheezes. Heart:     Normal rate and regular rhythm. S1 and S2 normal without gallop, murmur, click, rub or other extra sounds. Extremities:     No clubbing, cyanosis, edema, or deformity noted with normal full range of motion of all joints.   Skin:     Intact without suspicious lesions or rashes Psych:     Cognition and judgment appear intact. Alert and cooperative with normal attention span and concentration. No apparent delusions, illusions, hallucinations    Impression & Recommendations:  Problem # 1:  CHRONIC SYSTOLIC HEART FAILURE (123456) Assessment: Unchanged Patient euvolemic on exam today. Will continue to hold diuretics given recent dehydration 1 week ago.  Patient stable for ICD implant tomorrow.  His updated medication list for this problem includes:    Coreg 25 Mg Tabs (Carvedilol) ..... One tab by mouth bid    Lisinopril 2.5 Mg Tabs (Lisinopril) ..... One tab by mouth qday    Aspirin 325 Mg Tabs (Aspirin) .Marland Kitchen... 1 qd  Orders:  [place]- Est  Level 4 [id number])   Problem # 2:  CHRONIC KIDNEY DISEASE UNSPECIFIED (ICD-585.9) Assessment: Unchanged Will check BMET. Contiue to hold ARB since patient had worsening renal failure on ACE/ARB combination.  Patient on very low dose ACE-I now and will check Cr today. Orders: [facility] [id number]) [place]- Est  Level 4 [id number])   Problem # 3:  HYPERTENSION, BENIGN (ICD-401.1) Assessment: Deteriorated BP slightly elevated but was taking Bidil once a day. No changes for now His updated medication list for this problem includes:    Coreg 25 Mg Tabs (Carvedilol) ..... One tab by mouth bid    Lisinopril 2.5 Mg Tabs (Lisinopril) ..... One tab by mouth qday    Norvasc 5 Mg Tabs (Amlodipine besylate) ..... One tab by mouth qday  Orders: [place]- Est  Level 4 [id number])   Problem # 4:  HYPERKALEMIA (ICD-276.7) Assessment: Unchanged Resolved before patient left hospital.  Need to recheck labs today. Orders: [place]- Est  Level 4 ([id number])   Complete Medication List: 1)  Coreg 25 Mg Tabs (Carvedilol) .... One tab by mouth bid 2)  Lisinopril 2.5 Mg Tabs (Lisinopril) .... One tab by mouth qday 3)  Bidil 20-37.5 Mg Tabs (Isosorb dinitrate-hydralazine) .... One tab by mouth tid 4)  Norvasc 5 Mg Tabs (Amlodipine besylate) .... One tab by mouth qday 5)  Pravastatin Sodium 80 Mg Tabs (Pravastatin sodium) .... One tab by mouth qhs 6)  Aspirin 325 Mg Tabs (Aspirin) .Marland Kitchen.. 1 qd   Patient Instructions: 1)  please schedule patient f/u appt with Dr. Nori Riis (his new PCP) 2)  continue same medications for now    ]

## 2010-05-09 NOTE — Assessment & Plan Note (Signed)
Summary: req injection/eo   Vital Signs:  Patient profile:   50 year old male Weight:      315 pounds Temp:     98.5 degrees F oral Pulse rate:   69 / minute Pulse rhythm:   regular BP sitting:   121 / 77  (left arm) Cuff size:   large  Vitals Entered By: Audelia Hives CMA (March 06, 2010 10:30 AM) CC: injection   Primary Care Surya Folden:  [name] MD  CC:  injection.  History of Present Illness: Follow up hypertension. Taking medicines regularly with no problems. Not having any any headaches or chest pains.  Follow-up hyperlipidemia. Trying to follow a good diet, taking medicines regularly. His cardiologist told him to decrease his simvastatin by half.Not having any problems with medicines, no myalgias and no fatigue.   Left shoulder still bothering him intermottently. has reconsidered going to ortho and wants me to reschedule that.  Some home stressors  Habits & Providers  Alcohol-Tobacco-Diet     Alcohol drinks/day: 0     Tobacco Status: never  Current Medications (verified): 1)  Coreg 25 Mg Tabs (Carvedilol) .... One Tab By Mouth Bid 2)  Hydralazine Hcl 50 Mg  Tabs (Hydralazine Hcl) .... Three Times A Day 3)  Norvasc 10 Mg  Tabs (Amlodipine Besylate) .Marland Kitchen.. 1 By Mouth Once Daily 4)  Aspirin 325 Mg Tabs (Aspirin) .... Take 1 Tablet By Mouth Once Daily 5)  Isosorbide Dinitrate 20 Mg  Tabs (Isosorbide Dinitrate) .... Take 1 Tablet By Mouth Three Times A Day 6)  Furosemide 20 Mg  Tabs (Furosemide) .... 2 By Mouth Once Daily 7)  Cozaar 50 Mg Tabs (Losartan Potassium) .... Take One Tablet Daily 8)  Aldactone 25 Mg Tabs (Spironolactone) .Marland Kitchen.. 1 By Mouth Once Daily Per Dr Glennon Mac 9)  Simvastatin 40 Mg Tabs (Simvastatin) .... Once Daily  Allergies: No Known Drug Allergies  Past History:  Past Medical History: Last updated: 08/14/2009 non-obstructive cardiomyopathy EF 20%-30%, normal coronaries from Cath 2006;  ; ECHO 01/2007 EF 25% -30%;  prolonged QT  Glucose  intolerance--pre diabetes. Obstructive sleep apnea      - AHI 38      - CPAP 18 cm H2O HTN Obesity renal insufficiency--worsens w ACE AICD-Medtronic Maximo 7232  Social History: lives in [place] with 52 yo sone and his wife. Both his daughters are living with their boyfirends and this causes him some stress. Intermittent marital stressors.    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    Physical Exam  General:  alert, well-developed, well-nourished, and well-hydrated.   Neck:  supple, full ROM, and no masses.  no carotid bruits Lungs:  normal breath sounds.   Heart:  normal rate, regular rhythm, and no murmur.   Abdomen:  soft.  obese Msk:  no joint tenderness and no joint swelling.   Extremities:  no edema Neurologic:  alert & oriented X3 and gait normal.     Impression & Recommendations:  Problem # 1:  SHOULDER PAIN, LEFT (ICD-719.41)  will reschedule ortho referral  Orders: [place]- Est  Level 4 [id number])  Problem # 2:  HYPERTENSION, BENIGN (ICD-401.1)  His updated medication list for this problem includes:  Coreg 25 Mg Tabs (Carvedilol) ..... One tab by mouth bid    Hydralazine Hcl 50 Mg Tabs (Hydralazine hcl) .Marland Kitchen... Three times a day    Norvasc 10 Mg Tabs (Amlodipine besylate) .Marland Kitchen... 1 by mouth once daily    Furosemide 20 Mg Tabs (Furosemide) .Marland Kitchen... 2 by mouth once daily    Cozaar 50 Mg Tabs (Losartan potassium) .Marland Kitchen... Take one tablet daily    Aldactone 25 Mg Tabs (Spironolactone) .Marland Kitchen... 1 by mouth once daily per dr Glennon Mac  Orders: [facility] [phone]) [facility]- Est  Level 4 [phone])  Problem # 3:  OTHER ABNORMAL GLUCOSE (ICD-790.29)  Orders: [facility] [id number]) A1C-[facility]  [id number]) [place]- Est  Level 4 [id number])  Complete Medication List: 1)  Coreg 25 Mg Tabs (Carvedilol) .... One tab by mouth bid 2)  Hydralazine Hcl 50 Mg Tabs (Hydralazine hcl) .... Three times a day 3)  Norvasc 10 Mg Tabs (Amlodipine besylate) .Marland Kitchen.. 1 by mouth once daily 4)  Aspirin 325 Mg Tabs (Aspirin) .... Take 1 tablet by mouth once daily 5)  Isosorbide Dinitrate 20 Mg Tabs (Isosorbide dinitrate) .... Take 1 tablet by mouth three times a day 6)  Furosemide 20 Mg Tabs (Furosemide) .... 2 by mouth once daily 7)  Cozaar 50 Mg Tabs (Losartan potassium) .... Take one tablet daily 8)  Aldactone 25 Mg Tabs (Spironolactone) .Marland Kitchen.. 1 by mouth once daily per dr Glennon Mac 9)  Simvastatin 40 Mg Tabs (Simvastatin) .... Once daily  Patient Instructions: 1)  We will send you a letter about your lab work. Try to get back into walking. I have refilled your medicines. Please return to clinic in 3 months. Prescriptions: FUROSEMIDE 20 MG  TABS (FUROSEMIDE) 2 by mouth once daily  #180 x 3   Entered and Authorized by:   [name] MD   Signed by:   [name] MD on 03/06/2010   Method used:   Electronically to        [facility] [phone]* (retail)       [place], [place]  [address]       Ph: [phone]       Fax: [phone]   RxID:   [id number] NORVASC 10 MG  TABS (AMLODIPINE BESYLATE) 1 by mouth once daily  #90 Each x 3   Entered and Authorized by:   [name] MD   Signed by:   [name] MD on 03/06/2010   Method used:   Electronically to        [facility] [phone]* (retail)       [place], [place]  [address]       Ph: [phone]       Fax: [phone]   [id number]   [phone] ISOSORBIDE DINITRATE 20 MG  TABS (ISOSORBIDE DINITRATE) Take 1 tablet by mouth three times a day  #270 x 3   Entered and Authorized by:   [name] MD   Signed by:   [name] MD on 03/06/2010   Method used:   Electronically to        [facility] [phone]*  (retail)       [address]       [place], [place]  [address]       Ph: [phone]       Fax: [phone]   [id number]    Orders Added: 1)  Comp Met-[facility] [  [id number] 2)  A1C-[facility] [830[address]] 3)  [place]- Est  Level 4 [id number]    Prevention & Chronic Care Immunizations   Influenza vaccine: Fluvax Non-MCR  (01/10/2008)   Influenza vaccine deferral: Refused  (01/24/2009)    Tetanus booster: 11/17/2007: Tdap   Tetanus booster due: 11/16/2017    Pneumococcal vaccine: Not documented  Colorectal Screening   Hemoccult: Not documented    Colonoscopy: Not documented  Other Screening   PSA: 0.16  (08/01/2009)   Smoking status: never  (03/06/2010)  Lipids   Total Cholesterol: 121  (04/14/2007)   LDL: 75  (04/14/2007)   LDL Direct: 78  (08/01/2009)   HDL: 31  (04/14/2007)   Triglycerides: 76  (04/14/2007)  Hypertension   Last Blood Pressure: 121 / 77  (03/06/2010)   Serum creatinine: 1.25  (01/09/2010)   Serum potassium 4.1  (01/09/2010) CMP ordered   Self-Management Support :   Personal Goals (by the next clinic visit) :      Personal blood pressure goal: 140/90  (01/24/2009)   Hypertension self-management support: Written self-care plan  (01/24/2009)  Laboratory Results   Blood Tests   Date/Time Received: March 06, 2010 11:17 AM  Date/Time Reported: March 06, 2010 11:57 AM   HGBA1C: 7.3%   (Normal Range: Non-Diabetic - 3-6%   Control Diabetic - 6-8%)  Comments: ...........test performed by...........Marland KitchenHedy Camara, CMA

## 2010-05-09 NOTE — Consult Note (Signed)
Summary: [facility]   Imported By: Beryle Lathe 04/05/2010 09:12:32  _____________________________________________________________________  External Attachment:    Type:   Image     Comment:   External Document

## 2010-05-09 NOTE — Procedures (Signed)
Summary: pacer check      Allergies Added: NKDA  Current Medications (verified): 1)  Coreg 25 Mg Tabs (Carvedilol) .... One Tab By Mouth Bid 2)  Lisinopril 10 Mg  Tabs (Lisinopril) .... Once Daily 3)  Hydralazine Hcl 50 Mg  Tabs (Hydralazine Hcl) .... Three Times A Day 4)  Norvasc 10 Mg  Tabs (Amlodipine Besylate) .Marland Kitchen.. 1 By Mouth Once Daily 5)  Simvastatin 80 Mg Tabs (Simvastatin) .Marland Kitchen.. 1 By Mouth Once Daily 6)  Aspirin 325 Mg Tabs (Aspirin) .Marland Kitchen.. 1 Qd 7)  Isosorbide Dinitrate 20 Mg  Tabs (Isosorbide Dinitrate) .... Tid 8)  Furosemide 20 Mg  Tabs (Furosemide) .Marland Kitchen.. 1 By Mouth Once Daily 9)  Epipen 0.3 Mg/0.80ml (1:1000)  Devi (Epinephrine Hcl (Anaphylaxis)) .... Use As Directed For Shortness of Breath or Dizziness Associated With Bee or Wasp Sting  Allergies (verified): No Known Drug Allergies   ICD Specifications Following MD:  [name], MD     ICD Vendor:  Medtronic     ICD Model Number:  [id number]     ICD Serial Number:  [id number] ICD DOI:  02/19/2007     ICD Implanting MD:  [name], MD  Lead 1:    Location: RV     DOI: 02/19/2007     Model #: [id number]     Serial #: [id number]     Status: active  Indications::  ICM   ICD Follow Up Remote Check?  No Battery Voltage:  3.14 V     Charge Time:  7.55 seconds     Underlying rhythm:  SR ICD Dependent:  No       ICD Device Measurements Right Ventricle:  Amplitude: 7.5 mV, Impedance: 408 ohms, Threshold: 1.0 V at 0.2 msec Shock Impedance: 38/49 ohms   Episodes Coumadin:  No Shock:  0     ATP:  0     Nonsustained:  2     ICD Appropriate Therapy?  No Ventricular Pacing:  <1%  Brady Parameters Mode VVI     Lower Rate Limit:  40      Tachy Zones VF:  188     VT:  150 (MONITOR)     Next Cardiology Appt Due:  02/05/2009 Tech Comments:  Normal device function.  Wavelet template updated to allow for 97-100% match scores.  RV ouptut decreased to 2.5V at 0.31msec per protocol.  2 NSVT episodes were 6-8 beats.  ROV 3 months Dr Caryl Comes. Chanetta Marshall RN BSN  November 15, 2008 10:30 AM

## 2010-05-09 NOTE — Letter (Signed)
Summary: LAB Letter  [facility]  [address]   [place], [place] [address]   Phone: [phone]  Fax: [phone]    04/09/2010  [name] [address] [place], [place]  [address]  Dear [name],  Your kidney numbers are continuing to be a little elevated--I will recheck in 2 months and probably every two months for a while.  Please come for a lab only appointment in late February. .Hope your holiday went well.         Sincerely,   [name] MD  Appended Document: LAB Letter mailed letter to patient

## 2010-05-09 NOTE — Progress Notes (Signed)
   Phone Note Call from Patient   Caller: Spouse-[name] Call For: [phone] Summary of Call: Wife called [facility] and was told a referral was never sent for patient.  [name] shoulder is getting worse.  Wife  need an appt asap.  Please send referral and call back with appt info.   Follow-up for Phone Call        tried to call pt's wife to inform her of what was going on with her husband's referral and there is no vm. phone rang many times and then gave busy signal. I aslo called [facility] and the person Hildred Alamin) that usually works on referrals is not there. so they are dividing the referrals between everyone in the practice Follow-up by: Audelia Hives CMA,  March 22, 2010 11:50 AM  Additional Follow-up for Phone Call Additional follow up Details #1::        spoke with pt's wife and informed her of the status of referral Additional Follow-up by: Audelia Hives CMA,  March 23, 2010 12:01 PM

## 2010-05-09 NOTE — Assessment & Plan Note (Signed)
Summary: medtronic/saf  Medications Added FUROSEMIDE 20 MG  TABS (FUROSEMIDE) 2 by mouth once daily COZAAR 50 MG TABS (LOSARTAN POTASSIUM) Take one tablet daily      Allergies Added: NKDA  Primary Provider:  [name] MD   History of Present Illness:  [name] is seen in followup for an ICD implanted in the setting of nonischemic heart disease and congestive heart failure.  He is doing pretty well.  He had shortness of breath and some peripheral edema.  We reviewed his dietary salt intake;   he eats frequent sandwiches with cold cuts and prepared foods  He has sleep disordered breathing and  daytime somnolence  he also complains of a nonproductive cough  Current Medications (verified): 1)  Coreg 25 Mg Tabs (Carvedilol) .... One Tab By Mouth Bid 2)  Lisinopril 10 Mg  Tabs (Lisinopril) .... Once Daily 3)  Hydralazine Hcl 50 Mg  Tabs (Hydralazine Hcl) .... Three Times A Day 4)  Norvasc 10 Mg  Tabs (Amlodipine Besylate) .Marland Kitchen.. 1 By Mouth Once Daily 5)  Simvastatin 80 Mg Tabs (Simvastatin) .Marland Kitchen.. 1 By Mouth Once Daily 6)  Aspirin 325 Mg Tabs (Aspirin) .Marland Kitchen.. 1 Qd 7)  Isosorbide Dinitrate 20 Mg  Tabs (Isosorbide Dinitrate) .... Tid 8)  Furosemide 20 Mg  Tabs (Furosemide) .Marland Kitchen.. 1 By Mouth Once Daily 9)  Epipen 0.3 Mg/0.22ml (1:1000)  Devi (Epinephrine Hcl (Anaphylaxis)) .... Use As Directed For Shortness of Breath or Dizziness Associated With Bee or Wasp Sting  Allergies (verified): No Known Drug Allergies  Past History:  Past Medical History: Last updated: 02/12/2009 non-obstructive cardiomyopathy EF 20%-30%, normal coronaries from Cath 2006;  ; ECHO 01/2007 EF 25% -30%;  prolonged QT  Glucose intolerance--pre diabetes. OSA HTN Obesity renal insufficiency--worsens w ACE AICD-Medtronic Maximo 7232  Vital Signs:  Patient profile:   50 year old male Height:      66 inches Weight:      313 pounds BMI:     50.70 Pulse rate:   69 / minute Pulse rhythm:   irregular BP  sitting:   164 / 96  (left arm) Cuff size:   large  Vitals Entered By: Doug Sou CMA (February 13, 2009 8:58 AM)  Physical Exam  General:  Alert and oriented in no acute distress. HEENT  normal with poor dentition. Neck veins non-discernible; carotids brisk and full without bruits. No lymphadenopathy. Back without kyphosis. Lungs clear. Heart sounds regular without murmurs or gallops. PMI nondisplaced. Abdomen soft with active bowel sounds without midline pulsation or hepatomegaly. Femoral pulses and distal pulses intact. Extremities were without clubbing cyanosis; 2+ peripheral edema bilaterally Skin warm and dry. Neurological exam grossly normal     ICD Specifications Following MD:  [name], MD     ICD Vendor:  Medtronic     ICD Model Number:  [id number]     ICD Serial Number:  [id number] ICD DOI:  02/19/2007     ICD Implanting MD:  [name], MD  Lead 1:    Location: RV     DOI: 02/19/2007     Model #: [id number]     Serial #: [id number]     Status: active  Indications::  ICM   ICD Follow Up Remote Check?  No Battery Voltage:  3.14 V     Charge Time:  7.55 seconds     Underlying rhythm:  SR ICD Dependent:  No       ICD Device Measurements Right Ventricle:  Amplitude: 7.8 mV,  Impedance: 408 ohms, Threshold: 1.0 V at 0.2 msec Shock Impedance: 39/49 ohms   Episodes Coumadin:  No Shock:  0     ATP:  0     Nonsustained:  40     Ventricular Pacing:  <0.1%  Brady Parameters Mode VVI     Lower Rate Limit:  40      Tachy Zones VF:  200     VT:  231 FVT VIA VF     VT1:  150 (MONITOR)     Next Cardiology Appt Due:  05/08/2009 Tech Comments:  Normal device function. All episodes in monitor zone and of NSVT were on October 30th except for 1.  Pt doesn't remember anything happening that day.  Morphology template matches SR.  No abrupt changes in rate, likely ST. Device changed to 2 zones per Dr Caryl Comes, NID's extended.   Pt prefers office visits to [place].  ROV 3 months device clinic.  Administrator, arts RN BSN  February 13, 2009 9:09 AM  MD Comments:  device was reprogrammed to create 2 zones with ATP in the first zone; parameters were lengthened to minimum inappropriate therapy  Impression & Recommendations:  Problem # 1:  SLEEP APNEA (ICD-780.57) wundertake outpatient sleep study. This may be contributing to hypertension retinopathy as well as hopefully be able to promote improved energy and increased exercise tolerance. Orders: Misc. Referral (Misc. Ref)  Problem # 2:  COUGH (ICD-786.2) I.wonderwhether this is related to his lisinopril. We'll plan to discontinue lisinopril and put him on Cozaar at 50 mg a day The following medications were removed from the medication list:    Lisinopril 10 Mg Tabs (Lisinopril) ..... Once daily His updated medication list for this problem includes:    Coreg 25 Mg Tabs (Carvedilol) ..... One tab by mouth bid    Norvasc 10 Mg Tabs (Amlodipine besylate) .Marland Kitchen... 1 by mouth once daily    Aspirin 325 Mg Tabs (Aspirin) .Marland Kitchen... 1 qd    Isosorbide Dinitrate 20 Mg Tabs (Isosorbide dinitrate) .Marland Kitchen... Tid  Problem # 3:  CHRONIC SYSTOLIC HEART FAILURE (123456) his peripheral edema as well manifestations of his heart failure.; it is possible that this is related to his amlodipine. I think for right now however I will presume a former and increase his furosemide from 20-40 mg a day  Problem # 4:  CARDIOMYOPATHY, PRIMARY, DILATED (ICD-425.4)  Psee the above  The following medications were removed from the medication list:    Lisinopril 10 Mg Tabs (Lisinopril) ..... Once daily His updated medication list for this problem includes:    Coreg 25 Mg Tabs (Carvedilol) ..... One tab by mouth bid    Norvasc 10 Mg Tabs (Amlodipine besylate) .Marland Kitchen... 1 by mouth once daily    Aspirin 325 Mg Tabs (Aspirin) .Marland Kitchen... 1 qd    Isosorbide Dinitrate 20 Mg Tabs (Isosorbide dinitrate) .Marland Kitchen... Tid    Furosemide 20 Mg Tabs (Furosemide) .Marland Kitchen... 2 by mouth once daily    Cozaar 50 Mg  Tabs (Losartan potassium) .Marland Kitchen... Take one tablet daily  Problem # 5:  IMPLANTATION OF DEFIBRILLATOR, MEDTRONIC MAXIMO 7232 (ICD-V45.02) normal device function with reprogramming as anoted  Patient Instructions: 1)  Your physician recommends you have a Night Watch Monitor for possible sleep apnea 2)  Your physician has recommended you make the following change in your medication: INCREASE your Furosemide to 2 daily. STOP your Lisinopril and START Cozaar 50mg  daily 3)  Your physician recommends that you schedule a follow-up appointment in:  4)  Your physician has requested that you limit the intake of sodium (salt) in your diet to four grams daily. Please see [facility] handout. Prescriptions: FUROSEMIDE 20 MG  TABS (FUROSEMIDE) 2 by mouth once daily  #180 x 3   Entered by:   Barnett Abu, RN, BSN   Authorized by:   Nikki Dom, MD, [facility]   Signed by:   Barnett Abu, RN, BSN on 02/13/2009   Method used:   Electronically to        [facility] [phone]* (retail)       [place], [place]  [address]       Ph: [phone]       Fax: [phone]   RxID:   [id number] COZAAR 50 MG TABS ([facility]) Take one tablet daily  #90 x 3   Entered by:   Barnett Abu, RN, BSN   Authorized by:   Nikki Dom, MD, [facility]   Signed by:   Barnett Abu, RN, BSN on 02/13/2009   Method used:   Electronically to        [facility] [phone]* (retail)       [address]       [place], [place]  [address]       Ph: [phone]       Fax: [phone]   RxID:   [id number]

## 2010-05-09 NOTE — Letter (Signed)
Summary: LAB Letter  [facility]  [address]   [place], [place] [address]   Phone: [phone]  Fax: [phone]    04/26/2009  [name] [address] [place], [place]  [address]  Dear [name],  Your lab work from today looks GOOD!. I really want to repeat this after you have been on the aldactone for a week         Sincerely,   [name] MD  Appended Document: LAB Letter mailed.

## 2010-05-09 NOTE — Letter (Signed)
Summary: Handout Printed  Printed Handout:  - Diet - Sodium-Controlled

## 2010-05-09 NOTE — Progress Notes (Signed)
   Phone Note Outgoing Call   Call placed by: Barnett Abu, RN, BSN,  March 27, 2009 12:09 PM Call placed to: Patient Summary of Call: Called patient, no machine available to leave a message.   To discuss sleep study results. Pt is positive for severy OSA. Will need to refer him to Pulmonologist for f/u, per Dr. Jori Moll, RN, BSN  March 27, 2009 12:10 PM   Follow-up for Phone Call        S/W pt and informed him that he would need to f/u with a Pulmonologist for Severe OSA. He understands and will be waiting on a call to schedule.  Follow-up by: Barnett Abu, RN, BSN,  March 28, 2009 12:32 PM  New Problems: OBSTRUCTIVE SLEEP APNEA (ICD-327.23)   New Problems: OBSTRUCTIVE SLEEP APNEA (ICD-327.23)

## 2010-05-21 ENCOUNTER — Encounter: Payer: Self-pay | Admitting: Internal Medicine

## 2010-05-21 ENCOUNTER — Encounter (INDEPENDENT_AMBULATORY_CARE_PROVIDER_SITE_OTHER): Payer: Medicare Other | Admitting: Internal Medicine

## 2010-05-21 DIAGNOSIS — I5022 Chronic systolic (congestive) heart failure: Secondary | ICD-10-CM

## 2010-05-21 DIAGNOSIS — E669 Obesity, unspecified: Secondary | ICD-10-CM

## 2010-05-21 DIAGNOSIS — I428 Other cardiomyopathies: Secondary | ICD-10-CM

## 2010-05-21 HISTORY — DX: Morbid (severe) obesity due to excess calories: E66.01

## 2010-05-28 ENCOUNTER — Other Ambulatory Visit: Payer: Medicare Other

## 2010-05-28 DIAGNOSIS — E875 Hyperkalemia: Secondary | ICD-10-CM

## 2010-05-28 DIAGNOSIS — N183 Chronic kidney disease, stage 3 unspecified: Secondary | ICD-10-CM

## 2010-05-28 LAB — BASIC METABOLIC PANEL
BUN: 17 mg/dL (ref 6–23)
CO2: 26 mEq/L (ref 19–32)
Calcium: 8.8 mg/dL (ref 8.4–10.5)
Chloride: 105 mEq/L (ref 96–112)
Creat: 1.47 mg/dL (ref 0.40–1.50)
Glucose, Bld: 155 mg/dL — ABNORMAL HIGH (ref 70–99)
Potassium: 4.2 mEq/L (ref 3.5–5.3)
Sodium: 142 mEq/L (ref 135–145)

## 2010-05-28 LAB — CONVERTED CEMR LAB
BUN: 17 mg/dL (ref 6–23)
CO2: 26 meq/L (ref 19–32)
Calcium: 8.8 mg/dL (ref 8.4–10.5)
Chloride: 105 meq/L (ref 96–112)
Creatinine, Ser: 1.47 mg/dL (ref 0.40–1.50)
Glucose, Bld: 155 mg/dL — ABNORMAL HIGH (ref 70–99)
Potassium: 4.2 meq/L (ref 3.5–5.3)
Sodium: 142 meq/L (ref 135–145)

## 2010-05-29 ENCOUNTER — Encounter: Payer: Self-pay | Admitting: Family Medicine

## 2010-05-29 NOTE — Assessment & Plan Note (Signed)
Summary: no avail pacer day in am- gd/rs from [facility]  Medications Added SIMVASTATIN 40 MG TABS (SIMVASTATIN) take one half tablet once daily      Allergies Added: NKDA  Referring Provider:  Dr. [name], Dr. Charolette Forward Primary Provider:  [name] MD  CC:  ICD follow up.  Marland Kitchen  History of Present Illness:  [name] is seen in followup for an ICD implanted in the setting of nonischemic heart disease and congestive  heart failure.     He is doing pretty well.  He had shortness of breath and some peripheral edema.  We reviewed his dietary salt intake;   he eats frequent sandwiches with cold cuts and prepared foods.  he continues to have problems with pain in his shoulder. He is worried about the fact that he has been told he is high risk for surgical repair.        Current Medications (verified): 1)  Coreg 25 Mg Tabs (Carvedilol) .... One Tab By Mouth Bid 2)  Hydralazine Hcl 50 Mg  Tabs (Hydralazine Hcl) .... Three Times A Day 3)  Norvasc 10 Mg  Tabs (Amlodipine Besylate) .Marland Kitchen.. 1 By Mouth Once Daily 4)  Aspirin 325 Mg Tabs (Aspirin) .... Take 1 Tablet By Mouth Once Daily 5)  Isosorbide Dinitrate 20 Mg  Tabs (Isosorbide Dinitrate) .... Take 1 Tablet By Mouth Three Times A Day 6)  Furosemide 20 Mg  Tabs (Furosemide) .... 2 By Mouth Once Daily 7)  Cozaar 50 Mg Tabs (Losartan Potassium) .... Take One Tablet Daily 8)  Aldactone 25 Mg Tabs (Spironolactone) .Marland Kitchen.. 1 By Mouth Once Daily Per Dr Glennon Mac 9)  Simvastatin 40 Mg Tabs (Simvastatin) .... Take One Half Tablet Once Daily  Allergies (verified): No Known Drug Allergies  Past History:  Past Medical History: Last updated: 08/14/2009 non-obstructive cardiomyopathy EF 20%-30%, normal coronaries from Cath 2006;  ; ECHO 01/2007 EF 25% -30%;  prolonged QT  Glucose intolerance--pre diabetes. Obstructive sleep apnea      - AHI 40      - CPAP 18 cm H2O HTN Obesity renal insufficiency--worsens w ACE AICD-Medtronic Maximo  7232  Past Surgical History: Last updated: 04/25/2009 implantation AICD 03/2007 (Dr Lazariah Savard)-[employer] Maximo [id number] Cardiologist [name] ( general cardiology) and Dr Caryl Comes (folllows AICD) Right knee surgery  Family History: Last updated: 02/18/2007 mom: 4, living, Alzheimers, Type II DM father: deceased at 72, MI, CVA sister, deceased at 26 from cancer, heart diaseas sister 58, CHF, diabetes  Social History: Last updated: 03/06/2010 lives in [place] with 52 yo sone and his wife. Both his daughters are living with their boyfirends and this causes him some stress. Intermittent marital stressors.    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  Heart sounds were regular without murmurs or gallops. device pocket well-healed Abdomen was soft with active bowel sounds. There is no clubbing cyanosis trace edema Skin Warm and dry     ICD Specifications Following MD:  [name], MD     ICD Vendor:  Medtronic     ICD Model Number:  [id number]     ICD Serial Number:  [id number] ICD DOI:  02/19/2007     ICD Implanting MD:  [name], MD  Lead 1:    Location: RV     DOI: 02/19/2007     Model #: [id number]     Serial #: [id number]     Status: active  Indications::  ICM   ICD Follow Up ICD Dependent:  No      Episodes Coumadin:  No  Brady Parameters Mode VVI     Lower Rate Limit:  40        Tachy Zones VF:  200     VT:  231 FVT VIA VF     VT1:  150 (MONITOR)     Impression & Recommendations:  Problem # 1:  OBESITY, MORBID (ICD-278.01) We have reviewed the importance of weight loss in terms of long-term health issues. He is felt not to be a surgical candidate for his shoulder. I told he will be a surgical candidate for back and knee disease which is almost certainly forthcoming if he is unable to lose considerable weight. We have discussed strategies for weight loss.  Problem # 2:  CARDIOMYOPATHY, PRIMARY, DILATED (ICD-425.4) stable on current medication His updated medication list for this problem includes:    Coreg 25 Mg Tabs (Carvedilol) ..... One tab by mouth bid    Norvasc 10 Mg Tabs (Amlodipine besylate) .Marland Kitchen... 1 by mouth once daily    Aspirin 325 Mg Tabs (Aspirin) .Marland Kitchen... Take 1 tablet by mouth once daily    Isosorbide Dinitrate 20 Mg Tabs (Isosorbide dinitrate) .Marland Kitchen... Take 1 tablet by mouth three times a day    Furosemide 20 Mg Tabs (Furosemide) .Marland Kitchen... 2 by mouth once daily    Cozaar 50 Mg Tabs (Losartan potassium) .Marland Kitchen... Take one tablet daily    Aldactone 25 Mg Tabs (Spironolactone) .Marland Kitchen... 1 by mouth once daily per dr Glennon Mac  Problem # 3:  CHRONIC SYSTOLIC HEART FAILURE (123456) stable on current meds His updated medication list for this problem includes:    Coreg 25 Mg Tabs (Carvedilol) ..... One tab by mouth bid    Norvasc 10 Mg Tabs (Amlodipine besylate) .Marland Kitchen... 1 by mouth once daily    Aspirin 325 Mg Tabs (Aspirin) .Marland Kitchen... Take 1 tablet by mouth once daily    Isosorbide Dinitrate 20 Mg Tabs (Isosorbide dinitrate) .Marland Kitchen... Take 1 tablet by mouth three times a day    Furosemide 20 Mg Tabs (Furosemide) .Marland Kitchen... 2 by mouth once daily    Cozaar 50 Mg Tabs (Losartan potassium) .Marland Kitchen... Take one tablet daily    Aldactone 25 Mg Tabs (Spironolactone) .Marland Kitchen... 1 by mouth once daily per dr Glennon Mac  Problem # 4:  IMPLANTATION OF DEFIBRILLATOR, [place] [id number] (ICD-V45.02)  Device  parameters and data were reviewed and no changes were made  Patient Instructions: 1)  Your physician recommends that you schedule a follow-up appointment in: [place] 2)  Your physician recommends that you continue on your current medications as directed. Please refer to the Current Medication list given to you today.

## 2010-06-04 NOTE — Cardiovascular Report (Signed)
Summary: Office Visit   Office Visit   Imported By: Sallee Provencal 05/31/2010 09:05:14  _____________________________________________________________________  External Attachment:    Type:   Image     Comment:   External Document

## 2010-06-06 ENCOUNTER — Other Ambulatory Visit: Payer: Self-pay | Admitting: Family Medicine

## 2010-06-06 DIAGNOSIS — I10 Essential (primary) hypertension: Secondary | ICD-10-CM

## 2010-06-25 ENCOUNTER — Telehealth: Payer: Self-pay | Admitting: Family Medicine

## 2010-06-25 ENCOUNTER — Other Ambulatory Visit: Payer: Self-pay | Admitting: Family Medicine

## 2010-06-25 MED ORDER — ISOSORBIDE MONONITRATE 20 MG PO TABS: 20.0000 mg | ORAL_TABLET | Freq: Every day | ORAL | Status: DC

## 2010-06-25 MED ORDER — ISOSORBIDE MONONITRATE 20 MG PO TABS: 20.0000 mg | ORAL_TABLET | Freq: Two times a day (BID) | ORAL | Status: DC

## 2010-06-25 NOTE — Telephone Encounter (Signed)
I have already talked to pharmacist but will resend rx Please tell him it will be a NEW but similar med--he will take ONE tab in am and one in PM unlike his previous tid dosing THANKS!

## 2010-06-25 NOTE — Telephone Encounter (Signed)
Pt needs refill on isordil but pharmacy wont get anymore in until April, wants to know if we can call anything similar in? Pt goes to [facility].

## 2010-07-03 ENCOUNTER — Other Ambulatory Visit (HOSPITAL_COMMUNITY): Payer: Self-pay | Admitting: Orthopedic Surgery

## 2010-07-03 ENCOUNTER — Ambulatory Visit (HOSPITAL_COMMUNITY)
Admission: RE | Admit: 2010-07-03 | Discharge: 2010-07-03 | Disposition: A | Discharge status: Home / Self Care | Payer: Medicare Other | Source: Ambulatory Visit | Attending: Orthopedic Surgery | Admitting: Orthopedic Surgery

## 2010-07-03 ENCOUNTER — Encounter (HOSPITAL_COMMUNITY)
Admission: RE | Admit: 2010-07-03 | Discharge: 2010-07-03 | Disposition: A | Discharge status: Home / Self Care | Payer: Medicare Other | Source: Ambulatory Visit | Attending: Orthopedic Surgery | Admitting: Orthopedic Surgery

## 2010-07-03 DIAGNOSIS — Z01818 Encounter for other preprocedural examination: Secondary | ICD-10-CM | POA: Insufficient documentation

## 2010-07-03 DIAGNOSIS — I10 Essential (primary) hypertension: Secondary | ICD-10-CM | POA: Insufficient documentation

## 2010-07-03 DIAGNOSIS — Z01812 Encounter for preprocedural laboratory examination: Secondary | ICD-10-CM | POA: Insufficient documentation

## 2010-07-03 DIAGNOSIS — M19012 Primary osteoarthritis, left shoulder: Secondary | ICD-10-CM

## 2010-07-03 DIAGNOSIS — Z01810 Encounter for preprocedural cardiovascular examination: Secondary | ICD-10-CM | POA: Insufficient documentation

## 2010-07-03 DIAGNOSIS — G4730 Sleep apnea, unspecified: Secondary | ICD-10-CM | POA: Insufficient documentation

## 2010-07-03 LAB — COMPREHENSIVE METABOLIC PANEL
ALT: 21 U/L (ref 0–53)
AST: 18 U/L (ref 0–37)
Albumin: 4.3 g/dL (ref 3.5–5.2)
Alkaline Phosphatase: 66 U/L (ref 39–117)
BUN: 17 mg/dL (ref 6–23)
CO2: 24 mEq/L (ref 19–32)
Calcium: 9.7 mg/dL (ref 8.4–10.5)
Chloride: 109 mEq/L (ref 96–112)
Creatinine, Ser: 1.45 mg/dL (ref 0.4–1.5)
GFR calc Af Amer: >60 mL/min (ref >60)
GFR calc non Af Amer: 51 mL/min — ABNORMAL LOW (ref >60)
Glucose, Bld: 142 mg/dL — ABNORMAL HIGH (ref 70–99)
Potassium: 4.3 mEq/L (ref 3.5–5.1)
Sodium: 145 mEq/L (ref 135–145)
Total Bilirubin: 0.5 mg/dL (ref 0.3–1.2)
Total Protein: 7.1 g/dL (ref 6.0–8.3)

## 2010-07-03 LAB — URINALYSIS, ROUTINE W REFLEX MICROSCOPIC
Bilirubin Urine: NEGATIVE
Glucose, UA: NEGATIVE mg/dL
Hgb urine dipstick: NEGATIVE
Ketones, ur: NEGATIVE mg/dL
Nitrite: NEGATIVE
Protein, ur: NEGATIVE mg/dL
Specific Gravity, Urine: 1.011 (ref 1.005–1.030)
Urobilinogen, UA: 0.2 mg/dL (ref 0.0–1.0)
pH: 5.5 (ref 5.0–8.0)

## 2010-07-03 LAB — PROTIME-INR
INR: 1.02 (ref 0.00–1.49)
Prothrombin Time: 13.6 seconds (ref 11.6–15.2)

## 2010-07-03 LAB — CBC
HCT: 41.1 % (ref 39.0–52.0)
Hemoglobin: 13.6 g/dL (ref 13.0–17.0)
MCH: 27.4 pg (ref 26.0–34.0)
MCHC: 33.1 g/dL (ref 30.0–36.0)
MCV: 82.9 fL (ref 78.0–100.0)
Platelets: 273 10*3/uL (ref 150–400)
RBC: 4.96 MIL/uL (ref 4.22–5.81)
RDW: 14 % (ref 11.5–15.5)
WBC: 7.1 10*3/uL (ref 4.0–10.5)

## 2010-07-03 LAB — SURGICAL PCR SCREEN
MRSA, PCR: NEGATIVE
Staphylococcus aureus: NEGATIVE

## 2010-07-03 LAB — APTT: aPTT: 29 seconds (ref 24–37)

## 2010-07-04 ENCOUNTER — Telehealth: Payer: Self-pay | Admitting: *Deleted

## 2010-07-04 ENCOUNTER — Observation Stay (HOSPITAL_COMMUNITY)
Admission: RE | Admit: 2010-07-04 | Discharge: 2010-07-05 | Disposition: A | Discharge status: Home / Self Care | Payer: Medicare Other | Source: Ambulatory Visit | Attending: Orthopedic Surgery | Admitting: Orthopedic Surgery

## 2010-07-04 DIAGNOSIS — Z01810 Encounter for preprocedural cardiovascular examination: Secondary | ICD-10-CM | POA: Insufficient documentation

## 2010-07-04 DIAGNOSIS — Z01812 Encounter for preprocedural laboratory examination: Secondary | ICD-10-CM | POA: Insufficient documentation

## 2010-07-04 DIAGNOSIS — G4733 Obstructive sleep apnea (adult) (pediatric): Secondary | ICD-10-CM | POA: Insufficient documentation

## 2010-07-04 DIAGNOSIS — M66329 Spontaneous rupture of flexor tendons, unspecified upper arm: Secondary | ICD-10-CM | POA: Insufficient documentation

## 2010-07-04 DIAGNOSIS — M25819 Other specified joint disorders, unspecified shoulder: Principal | ICD-10-CM | POA: Insufficient documentation

## 2010-07-04 DIAGNOSIS — I509 Heart failure, unspecified: Secondary | ICD-10-CM | POA: Insufficient documentation

## 2010-07-04 DIAGNOSIS — M19019 Primary osteoarthritis, unspecified shoulder: Secondary | ICD-10-CM | POA: Insufficient documentation

## 2010-07-04 DIAGNOSIS — M659 Unspecified synovitis and tenosynovitis, unspecified site: Secondary | ICD-10-CM | POA: Insufficient documentation

## 2010-07-04 DIAGNOSIS — Z79899 Other long term (current) drug therapy: Secondary | ICD-10-CM | POA: Insufficient documentation

## 2010-07-04 DIAGNOSIS — M9420 Chondromalacia, unspecified site: Secondary | ICD-10-CM | POA: Insufficient documentation

## 2010-07-04 DIAGNOSIS — I10 Essential (primary) hypertension: Secondary | ICD-10-CM | POA: Insufficient documentation

## 2010-07-04 DIAGNOSIS — Z01818 Encounter for other preprocedural examination: Secondary | ICD-10-CM | POA: Insufficient documentation

## 2010-07-04 DIAGNOSIS — Z95810 Presence of automatic (implantable) cardiac defibrillator: Secondary | ICD-10-CM | POA: Insufficient documentation

## 2010-07-04 LAB — GLUCOSE, CAPILLARY
Glucose-Capillary: 178 mg/dL — ABNORMAL HIGH (ref 70–99)
Glucose-Capillary: 172 mg/dL — ABNORMAL HIGH (ref 70–99)
Glucose-Capillary: 124 mg/dL — ABNORMAL HIGH (ref 70–99)

## 2010-07-04 NOTE — Telephone Encounter (Signed)
Spoke with Corporate treasurer at [facility].  Pt scheduled for left shoulder surgery 07-04-10, requesting recommendations for device management during surgery.  Device was interrogated within the last month and was found to have normal device function, no recent appropriate therapy, and is not device dependent.  Discussed with Dr Caryl Comes, because device is located within sterile field, tachy therapies should be disabled during procedure and returned to normal programming post procedure.  Prescription faxed back to short stay, Beth notified of recommendations.  Also contacted Angelyn Punt with [employer] and notified of procedure date and time.  Since patient being interrogated in PACU post-op to return to normal programming, will ask [employer] rep to bring a copy of interrogation to office for files.  Patient does not do [employer] transmissions.

## 2010-07-05 LAB — GLUCOSE, CAPILLARY: Glucose-Capillary: 170 mg/dL — ABNORMAL HIGH (ref 70–99)

## 2010-07-11 ENCOUNTER — Other Ambulatory Visit: Payer: Self-pay | Admitting: Family Medicine

## 2010-07-15 ENCOUNTER — Other Ambulatory Visit (INDEPENDENT_AMBULATORY_CARE_PROVIDER_SITE_OTHER): Payer: Medicare Other | Admitting: Family Medicine

## 2010-07-15 DIAGNOSIS — I5022 Chronic systolic (congestive) heart failure: Secondary | ICD-10-CM

## 2010-07-15 DIAGNOSIS — I428 Other cardiomyopathies: Secondary | ICD-10-CM

## 2010-07-15 MED ORDER — METOPROLOL TARTRATE 25 MG PO TABS: ORAL_TABLET | ORAL | Status: DC

## 2010-07-15 NOTE — Progress Notes (Signed)
Back order on carvedilol--will switch to lopressor J Am Coll Cardiol, 2001; [phone]

## 2010-07-30 NOTE — Op Note (Signed)
NAME:  [name], [name]             ACCOUNT NO.:  [id number]  MEDICAL RECORD NO.:  [id number]           PATIENT TYPE:  LOCATION:                                 FACILITY:  PHYSICIAN:  Metta Clines. Aarionna Germer, M.D.  DATE OF BIRTH:  12-04-1960  DATE OF PROCEDURE:  07/04/2010 DATE OF DISCHARGE:                              OPERATIVE REPORT   PREOPERATIVE DIAGNOSES: 1. Chronic left shoulder impingement syndrome. 2. Left shoulder symptomatic acromioclavicular joint arthropathy.  POSTOPERATIVE DIAGNOSES: 1. Chronic left shoulder impingement syndrome. 2. Left shoulder symptomatic acromioclavicular joint arthropathy. 3. Chondromalacia of the humeral head. 4. Left shoulder glenoid joint synovitis. 5. Biceps tendon tendinopathy with unstable biceps anchor.  PROCEDURES: 1. Left shoulder examination under anesthesia. 2. Left shoulder diagnostic arthroscopy. 3. Biceps tendon tenotomy. 4. Extensive synovectomy. 5. Chondroplasty of the humeral head. 6. Debridement of labral tear. 7. Arthroscopic subacromial decompression and bursectomy. 8. Arthroscopic distal clavicle resection.  SURGEON:  Metta Clines. Javona Bergevin, MD  ASSISTANT:  Reather Laurence. Shuford, PA-C  ANESTHESIA:  General endotracheal as well as an interscalene block.  BLOOD LOSS:  Minimal.  DRAINS:  None.  HISTORY:  Mr. Lucio is a 50 year old morbidly obese male with multiple medical problems who has had ongoing severe left shoulder pain and functional limitations refractory to prolonged attempts at conservative management.  Due to his ongoing pain and functional limitations, he is brought to the operating room at this time for planned left shoulder arthroscopic surgery as described below.  Preoperatively, I had counseled Mr. Heider on treatment options as well as risks versus benefits thereof.  Possible surgical complications were reviewed including potential for bleeding, infection, neurovascular injury, persistent pain, loss of  motion, anesthetic complication and increased risk of perioperative morbidity and/or mortality related to his underlying complex medical problems.  He understands and accepts and agrees with our planned procedure.  PROCEDURE IN DETAIL:  After undergoing routine preoperative evaluation, the patient did receive prophylactic antibiotics and an interscalene block was established in the holding area by the Anesthesia Department. Placed supine on the operating table, underwent smooth induction of a general endotracheal anesthesia.  Turned to the right lateral decubitus position on a beanbag and appropriately padded and protected.  The left shoulder examination under anesthesia revealed full motion.  No obvious instability patterns.  Left arm was suspended at 70 degrees of abduction with 15 pounds of traction.  The left shoulder girdle region was sterilely prepped and draped in standard fashion.  Time-out was called. A posterior portal was established in the glenohumeral joint and anterior portal was established under direct visualization.  Diffuse synovitis was noted and we found broad grade 3 chondromalacia on the humeral head and all these areas were debrided with the shaver and synovectomy was performed.  There was a circumferential degenerative labral tear which was also debrided.  The biceps anchor which showed instability with severe inflammation of the root of the biceps and with these findings, we proceeded with a biceps tenotomy which was completed using the Stryker wand.  We did debrided a residual stump of the biceps to stable base.  The bicipital groove retract distally and against  the bicipital groove.  Rotator cuff showed some partial articular fraying which was debrided, but no obvious full-thickness defects and was estimated less than 5-10% of thickness of the tendon was involved.  We then completed the synovectomy and debridement and obtained hemostasis within the glenohumeral  joint.  Fluid and instruments were then removed. The arm was dropped down to 30 degrees with abduction  with the arthroscope introduced in the subacromial space in the posterior portal and direct lateral portal was established in the subacromial space. Abundant bursal tissue was encountered and this was divided and excised with combination of the shaver and Arthrex wand.  The Stryker wand was then used to remove the periosteum from the undersurface of the anterior half of the acromion.  There was a significant anterior-inferior acromial "hook."  Bur was introduced and these formed with subacromial decompression creating type 1 morphology.  The portal was then established directly anterior to the distal clavicle and the distal clavicle resection performed with a bur.  There was advanced AC arthrosis and care was taken to confirm visualization of the entire circumference of the distal clavicle to ensure adequate removal of bone. We then completed the subacromial/subdeltoid bursectomy which again was densely filled with scar tissue and inflammatory bursal tissues. Bursectomy was completed.  Hemostasis was obtained.  The bursal surface rotator cuff was carefully inspected and found to be intact.  At this point, fluid and instruments were then removed.  The port was closed with Monocryl and Steri-Strips.  A bulky dry dressing was then taped to the left shoulder.  Left arm was put in sling immobilizer.  The patient was then awakened, extubated, and taken to the recovery room in stable condition.     Metta Clines. Desree Leap, M.D.     KMS/MEDQ  D:  07/04/2010  T:  07/05/2010  Job:  [id number]  Electronically Signed by Justice Britain M.D. on 07/30/2010 06:24:20 PM

## 2010-08-05 ENCOUNTER — Encounter: Payer: Self-pay | Admitting: Pulmonary Disease

## 2010-08-20 NOTE — Assessment & Plan Note (Signed)
[place]                         ELECTROPHYSIOLOGY OFFICE NOTE   NAME:[name], [name]                    MRN:          [id number]  DATE:05/14/2007                            DOB:          1960-12-10    [name] is seen for primary prevention ICD implantation in the  setting of nonischemic heart disease and congestive heart failure.  He  also has renal insufficiency, and he has been having problems with edema  which he attributes to the introduction of hydralazine/ISDN.  Currently  his Norvasc has also been increased from 5 to 10.   He does not take a diuretic because this has affected his kidneys  adversely, apparently.   EXAMINATION:  His blood pressure is borderline elevated at 151/84.  His  pulse was 60.  LUNGS:  Clear.  HEART:  Sounds were regular.  EXTREMITIES:  Without edema.   MEDICATIONS:  1. Coreg 25 b.i.d.  2. Aspirin.  3. Lipitor.  4. Lisinopril 10.  5. Norvasc 10.  6. Hydralazine 50 b.i.d.  7. Lisinopril 10 recently increased from 2.5 under Dr. Zenia Dalton      direction.   Interrogation of his ICD demonstrates a Medtronic device with an R-wave  of 7.2, impedance of 560, and a threshold of 1 volt at 0.1, and high  voltage impedance was 39 ohms.   IMPRESSION:  1. Nonischemic cardiomyopathy.  2. Status post ICD for the above.  3. Renal insufficiency.  4. Congestive heart failure.  5. Peripheral edema.   [name] has a constellation of issues that are being balanced very  carefully.   I will defer these things to Dr. Terrence Dalton, but considerations could be  given to further titration of his hydralazine, up-titration of his  isosorbide, the introduction of a diuretic.   I am also concerned about the up-titration of his lisinopril, but he is  to see Dr. Terrence Dalton in 2 weeks, and I have asked [name] to make  sure that he has his renal function checked at that time.   We will see him in the device clinic in 3  months, and I will see him in  6 months' time.     [name], MD, [facility]  Electronically Signed    SCK/MedQ  DD: 05/14/2007  DT: 05/15/2007  Job #: [id number]   cc:   [name]. [name], M.D.

## 2010-08-20 NOTE — H&P (Signed)
NAME:  Dalton, Adam NO.:  [id number]   MEDICAL RECORD NO.:  [id number]          PATIENT TYPE:  INP   LOCATION:  2003                         FACILITY:  [facility]   PHYSICIAN:  [name], MD, FACCDATE OF BIRTH:  Sep 07, 1960   DATE OF ADMISSION:  02/12/2007  DATE OF DISCHARGE:                              HISTORY & PHYSICAL   PRIORITY ADMISSION HISTORY AND PHYSICAL   DATE OF ADMISSION:  February 12, 2007.   PRIMARY CARE [name]:  None.   ELECTROPHYSIOLOGIST:  [name], MD.   CARDIOLOGISTS:  Allegra Lai. Terrence Dupont, MD, and Birdie Riddle, MD.   ALLERGIES:  BEE STINGS, which causes hives and orotracheal swelling.   PRESENTING CIRCUMSTANCE:  I have been having trouble breathing.   HISTORY OF PRESENT ILLNESS:  [name] is a 50 year old male with  known nonischemic cardiomyopathy.  He had normal coronary anatomy at  catheterization October 2006.  At that time, his ejection fraction was  20%.  He presented to the emergency room October 26th after fighting  against progressive dyspnea for the greater part of the month of  October.   He has had decreased exercise tolerance unable, finally to take out the  garbage - a 25 foot journey - without severe dyspnea.  Now he cannot  walk from the bed to the bathroom without feeling short of breath.  He  has noted weight gain, a taut abdominal fullness.  He has fitful sleep;  he sleeps in catnaps.  He has had a persistent cough for the last weeks,  which was productive at times of an orange-brown exudate, but mostly  clear.  He finds it difficult to express, and he has coughed  persistently throughout this examination.  He also admits intermittent  chest pain.  He has been given diuretic therapy.  Echocardiogram shows  no improvement in the ejection fraction despite two years of medical  therapy.  Cardiac risk equivalent includes diabetes; cardiac risk factor  is hypertension.  Echocardiogram, February 02, 2007,  ejection fraction 25  to 30%, trivial mitral regurgitation.  Echocardiogram, January 27, 2005,  ejection fraction 25 to 30%.   MEDICATIONS:  1. Coreg 20 mg p.o. b.i.d.  2. Lisinopril 40 mg daily.  3. Lasix 80 mg p.o. b.i.d.  4. K-Dur 20 mEq b.i.d.  5. Biodel one tablet every 8 hours.  6. Micardis/HCT 80/25 one tab daily.  7. Norvasc 5 mg daily.  8. Lipitor 80 mg daily at bedtime.   SOCIAL HISTORY:  The patient lives in [place] with his wife and two  children.  He does not partake of alcoholic beverages or tobacco  products.  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   FAMILY HISTORY:  Mother is still living.  She has diabetes and  Alzheimer's at age 71.  Father died at age 65.  He had a history of  myocardial infarction and a CVA.  One sister died at age 47 with cancer,  but she had heart problems.  One sister died at age 49 of congestive  heart failure.  She had diabetes.   REVIEW  OF SYSTEMS:  The patient is not having any fevers, chills, night  sweats.  He does note a positive weight gain in the last month.  HEENT:  No nasal discharge or epistaxis, no voice changes, no vertigo, no  photophobia.  INTEGUMENT:  No rashes or nonhealing ulcerations.  CARDIOPULMONARY:  Positive for chest pain, shortness of breath, dyspnea  on exertion, orthopnea, paroxysmal nocturnal dyspnea, increased  dependent edema, and palpitations.  He has no history of syncope or  presyncope.  UROGENITAL:  Nocturia.  NEUROPSYCHIATRIC:  No weakness,  numbness, depression, or anxiety.  GI:  He has gastroesophageal reflux,  no history of GI bleed.  ENDOCRINE:  He has diabetes longstanding, no  polyuria, no polydipsia, and no heat or cold intolerance.  MUSCULOSKELETAL:  No arthralgias.  OTHER SYSTEMS:  Okay.   PHYSICAL EXAMINATION:  GENERAL:  A persistent cough throughout the  examination with poor clearance.  VITAL SIGNS:  Temperature 97, pulse is 75, blood pressure 141/97,  respirations are 20, oxygen  saturation is 99% on 2 liters.  He is alert  and oriented x3.  HEENT:  Normocephalic, atraumatic.  Eyes:  Pupils equal, round, and  reactive to light, extraocular movements are intact, sclerae are clear,  nares are patent without discharge.  NECK:  Supple, no carotid bruits auscultated, a 7 cm jugulovenous  distention, no cervical lymphadenopathy.  HEART:  Regular rate and rhythm without murmur.  LUNGS:  Relatively clear bilaterally.  ABDOMEN:  Obese, unable to palpate deep structures such as the spleen or  liver or the abdominal aorta, no rebound or guarding.  EXTREMITIES:  The lower extremities still show pitting edema  bilaterally.  MUSCULOSKELETAL:  No joint deformities or effusions, no kyphosis or  scoliosis.  NEUROLOGIC:  Grossly intact.   Electrocardiogram has been inspected and shows sinus rhythm, rate 96,  the PR interval is 158, QRS is 96, QTC is 502.  The BNP on the October  28th admission was 238.  BNP on October 26th was 398.  His troponin-I  studies are less than 0.05, then 0.05, then 0.06, and then 0.07.  Lipid  panel:  Cholesterol 212, triglycerides 219, HDL cholesterol 25, LDL  cholesterol at 143.   IMPRESSION:  1. Admitted on October 26th with acute on chronic [place] Heart      Association class III trending to class IV congestive heart failure      (chronic, systolic).  2. Echocardiogram February 02, 2007, ejection fraction 25 to 30%,      trivial mitral regurgitation, no improvement from echocardiogram      2006 despite maximum medical therapy.  3. Nonischemic cardiomyopathy.  Catheterization in October 2006 with      ejection fraction 20% and normal coronary anatomy.  4. Hypertension.  5. Obstructive sleep apnea, has not participated in polysomnography.  6. Obesity.  7. Possible long QT.  8. QRS is now at 100 msec.  9. Persistent cough, patient was added Azithromax today.   The plan is for the patient to return to [facility] on November  7th.   He will have a Medtronic cardioverter-defibrillator implanted at  that time for congestive heart failure with no improvement on maximum  medical therapy per the SCD/HeFT protocol by Dr. [name], his  electrophysiologist.  Our plan also is to have the patient have a repeat  CBC, BMET, and BNP on admission.      Sueanne Margarita, [place]      [name], MD, [facility]  Electronically Signed    GM/MEDQ  D:  02/11/2007  T:  02/11/2007  Job:  [id number]   cc:   Short Stay C for 11/07 admission

## 2010-08-20 NOTE — Op Note (Signed)
NAME:  [name], [name] NO.:  [id number]   MEDICAL RECORD NO.:  [id number]          PATIENT TYPE:  INP   LOCATION:  2032                         FACILITY:  [facility]   PHYSICIAN:  Champ Mungo. Lovena Le, MD    DATE OF BIRTH:  06-Mar-1961   DATE OF PROCEDURE:  02/19/2007  DATE OF DISCHARGE:                               OPERATIVE REPORT   PROCEDURE PERFORMED:  Implantation of a dual chamber pacemaker.   INDICATIONS:  Nonsustained VT, history of syncope, congestive heart  failure class III.   INTRODUCTION:  The patient is a 50 year old man admitted to the hospital  floor, seen with heart failure symptoms.  He has a history of syncope.  He has a history of nonsustained VT and congestive heart failure class 3  with an EF of 25%.  He is now referred for prophylactic ICD  implantation.  He is on ACE inhibitors, BiDil and Coreg.   PROCEDURE:  After informed consent was obtained, the patient was taken  to the diagnostic UV lab in the fasting state.  After the usual  preparation and draping, entering the stent and midazolam was given for  sedation.  Lidocaine 30 mL was infiltrated into the left infraclavicular  region.  A 7-cm incision was carried out over this region and  electrocautery utilized to dissect down to the fascial plane.  The left  subclavian vein was punctured and the Medtronic Sprint Quattro secured  model 6947, 65 cm active fixation pacing leads serial ID:[id number] was  advanced into the right ventricle.  Mapping was carried out at the final  site on the RV septum.  The R waves measured between 6 and 8 millivolts  and the pacing impedance with the lead actively fixed was 520 ohms.  The  threshold was a volt at 0.5 milliseconds.  The 10 volt pacing did not  stimulate the diaphragm.  With the ventricular lead in satisfactory  position, it was secured to the subpectoralis fascia with a figure-of-  eight silk suture.  The __________  was also secured with a silk suture.  Electrocautery was utilized to make a subcutaneous pocket.  Kanamycin  irrigation was utilized to irrigate the pocket.  Electrocautery was  utilized to assure hemostasis.  The Medtronic Maximo VR model [id number]  single chamber defibrillator serial [id number] was connected to the  defibrillation lead and placed back in the subcutaneous pocket.  Generator was secured with silk suture.  The patient was more deeply  sedated and defibrillation threshold testing carried out.   After the patient was more deeply sedated with fentanyl and Versed, VF  was induced with a T wave shock and a 15 joule shock was delivered,  which terminated VF and restored sinus rhythm.  At this point, there was  no additional defibrillation threshold testing and the incision was  closed with a layer of 2-0 Vicryl followed by a layer of 3-0 Vicryl.  Benzoin was painted on the skin.  Steri-Strips were applied and a  pressure dressing was placed and the patient was returned to his room in  satisfactory condition.   COMPLICATIONS:  There  are no immediate procedure complications.   RESULTS:  This demonstrates successful implantation of a Medtronic  single chamber defibrillator in a patient with nonischemic  cardiomyopathy, congestive heart failure class 3, EF 25% despite maximum  medical therapy, also with a history of nonsustained VT.      Champ Mungo. Lovena Le, MD  Electronically Signed     GWT/MEDQ  D:  02/19/2007  T:  02/19/2007  Job:  [id number]   cc:   Allegra Lai. Terrence Dupont, M.D.  Birdie Riddle, M.D.

## 2010-08-20 NOTE — Discharge Summary (Signed)
NAME:  [name], [name]             ACCOUNT NO.:  [id number]   MEDICAL RECORD NO.:  [id number]          PATIENT TYPE:  INP   LOCATION:  [id number]                         FACILITY:  [facility]   PHYSICIAN:  Dickie La, MD        DATE OF BIRTH:  [birth year]   DATE OF ADMISSION:  02/12/2007  DATE OF DISCHARGE:  02/15/2007                               DISCHARGE SUMMARY   DISCHARGE DIAGNOSES:  1. Acute renal failure.  2. Hyperkalemia.  3. Nonischemic dilated cardiomyopathy.  4. Hypertension.  5. Diabetes type 2 controlled by diet.   PROCEDURE/STUDIES:  February 13, 2007:  Chest x-ray, 2 view, moderately  prominent caliber of the pulmonary vessels but no evidence of frank CHF,  no edema or infiltrate.   DISCHARGE LABORATORIES:  Sodium 140, potassium 4.4, chloride 107,  bicarbonate 27, BUN 30, creatinine 1.71, glucose 115.  Calcium 9.3.   BRIEF HISTORY AND PHYSICAL:  A 50 year old African-American male found  to be in acute renal failure with potassium greater than 7.5 during  preop workup for outpatient AICD placement.   HOSPITAL COURSE:  1. Hyperkalemia:  The patient presented asymptomatic with admit      potassium of 7.4, likely secondary to recent aggressive diuresis      and potassium supplementation.  The patient was given Lasix 40 mg,      insulin with D50, calcium gluconate 1000 mg, and sodium bicarbonate      50 mEq, and Kayexalate 30 g in the ED.  Admit CBC, PT/INR, cardiac      enzymes were unremarkable.  The patient was admitted to the floor      and had a stable course overnight.  On hospital day #2, the      potassium returned at 4.4.  The patient tolerated p.o. intake and      IV fluids for hydration.  On hospital day #3, a stable and      asymptomatic course continued, and potassium returned at 4.9.  IV      fluids were held, and the patient was started on lisinopril 2.5 mg      p.o. daily.  The patient tolerated this course overnight, and on      day of discharge, potassium  was 4.4.  The patient's home regimen -      HCTZ, K-Dur, and Micardis HCT, were not resumed at discharge to      decrease likelihood of hyperkalemia.  Adjust as necessary at      followup.  2. Acute renal failure:  The patient presented asymptomatic with admit      creatinine of 3.57, likely secondary to overdiuresis prior to      admit, acutely, in combination with some baseline dysfunction      secondary to product comorbidities.  The patient's home regimen,      HCTZ and Micardis HCT, were held at admit.  The patient was      hydrated with IV fluids clears as tolerated.  The patient had a      stable course overnight, and on hospital day #2, creatinine  returned improved at 2.49.  The patient continued to tolerate p.o.      intake and IV fluids for hydration.  On hospital day #3, a stable      and asymptomatic course continued, and creatinine returned again      improved at 1.89.  IV fluids were held that day, and the patient      was restarted on lisinopril 2.5 mg p.o. daily in preparation for      discharge.  The patient tolerated this change overnight, and on the      day of discharge, creatinine returned at 1.71.  The patient was      discharged on Coreg 25 mg p.o. b.i.d., lisinopril 2.5 mg p.o.      daily, BiDil 1 tablet q.8h., and Norvasc 5 mg p.o. daily.  HCTZ and      Micardis HCT were not continued at discharge as mentioned above.  3. Nonischemic dilated cardiomyopathy:  The patient for ICD placement.      The patient presented from EP due to hyperkalemia finding at preop      ICD placement workup with plan for followup this week with EP for      reevaluation for ICD placement.  Otherwise, the patient remained      stable without exacerbation of underlying cardiovascular      dysfunction.  4. Hypertension:  The patient was admitted and continued on partial      home regimen of Coreg 25 mg b.i.d., Norvasc 5 mg daily, aspirin 325      mg daily, Micardis HCT 80/25 daily,  lisinopril 40 mg p.o. daily,      and HCTZ were held given the patient's renal dysfunction at admit.      The patient's home BiDil regimen was transitioned to isosorbide      dinitrate 20 mg p.o. q. 8h.  As renal function stabilized,      lisinopril 2.5 mg p.o. daily was started on hospital day #3.  The      patient's pressures remained stable at 100s-130 over 60s-80s      throughout the hospitalization.  The patient was discharged on the      regimen mentioned above.  5. Diabetes type 2:  The patient remained on dietary control      throughout hospitalization.  Random blood sugars on a.m. draws ran      100s-150s.  6. Hypercholesterolemia:  The patient was admitted on Lipitor 80 mg      p.o. daily; however, due to the patient's financial constraint, the      patient was discharged on pravastatin 80 mg p.o. daily.   DISCHARGE MEDICATIONS AND INSTRUCTIONS:  1. Coreg 25 mg p.o. b.i.d.  2. Lisinopril 2.5 mg p.o. daily.  3. BiDil 1 tablet q.8h.  4. Norvasc 5 mg p.o. daily.  5. Pravastatin 80 mg p.o. daily.  6. Aspirin 325 mg p.o. daily.   FOLLOWUP:  The patient is to follow up this Thursday, February 18, 2007,  at 8:30 a.m. with Dr. Sharlyne Pacas at the [facility].  EP will arrange followup with patient with regards to ICD placement.   ISSUES FOR FOLLOWUP:  ICD placement, followup renal function, and adjust  medications as necessary.  Social work called the patient on the day of  discharge and began process for possible Medicaid or other financial  assistance.      Dickie La, MD  Electronically Signed     SLN/MEDQ  D:  02/15/2007  T:  02/15/2007  Job:  [id number]

## 2010-08-20 NOTE — Discharge Summary (Signed)
NAME:  [name], [name] NO.:  [id number]   MEDICAL RECORD NO.:  [id number]          PATIENT TYPE:  INP   LOCATION:  2003                         FACILITY:  [facility]   PHYSICIAN:  Sueanne Margarita, PA   DATE OF BIRTH:  December 24, 1960   DATE OF ADMISSION:  01/31/2007  DATE OF DISCHARGE:  02/05/2007                               DISCHARGE SUMMARY   This patient has no primary caregiver.   ALLERGIES:  Patient has no known drug allergies.  He has allergy to BEE  STINGS, which cause hives and tracheal swelling.   FINAL DIAGNOSES:  1. Admitted with acute on chronic New [place] Heart Association [place]      Heart Association class III - IV congestive heart failure, chronic,      systolic.  2. Echocardiogram February 02, 2007 ejection fraction 25-30%, trivial      mitral regurgitation.  3. Left heart catheterization in October 2006, ejection fraction 20%,      normal coronaries.   SECONDARY DIAGNOSES:  1. Hypertension.  2. Obstructive sleep apnea.  3. Obesity.  4. Long Q-T  5. The QRS is 100 milliseconds.  6. Persistent cough.   BRIEF HISTORY:  [name] is a 50 year old male.  He has known  nonischemic cardiomyopathy. At catheterization October 2006 he had  normal coronaries but an EF of 20%.   He presents to the emergency room January 31, 2007, Sunday after  fighting against progressive dyspnea for the greater part of a month.  He has had decreased exercise tolerance. Unable, finally, to take out  the garbage which is a 25-foot journey.  He came to the hospital because  it caused severe dyspnea.  Now he cannot walk from the bed to the  bathroom without getting short of breath.  He has noted weight gain and  has a taut abdominal fullness.  He has fitful sleep and cat naps. He has  had a persistent cough for the last 2 weeks, productive at times of  orange/brown exudate but mostly clear which he finds difficult to cough  out.  He has cough throughout this examination.   He also has  intermittent chest pain.  The echocardiogram of February 02, 2007 shows  no improvement in ejection fraction despite 2 years of medical therapy   HOSPITAL COURSE:  The patient was admitted with acute on chronic  decompensated congestive heart failure.  He was ruled out for myocardial  infarction by serial troponin I studies.  He was seen by Dr. Virl Dalton, electrophysiologist, for consideration of placement of  cardioverter defibrillator.  Indications are the SCD/HeFT study. The  patient has a narrow QRS. He had aggressive diuresis over several days  this admission and was thought to be considered for cardioverter-  defibrillator under a donor program from the device manufacturer. The  patient is 50 years old, has no insurance.   He will be discharged on the following medications  1. Coreg 1 tablet twice daily.  2. Lisinopril 40 mg daily.  3. Lasix 80 mg twice daily.  4. K-Dur 20 mEq twice daily.  5. BiDil  1 tablet every 8 hours  6. Micardis/HCT 80/25 one tablet daily.  7. Norvasc 5 mg daily.  8. Lipitor 1 tablet daily at bedtime.   He sees Dr. Terrence Dalton in a week, and he will follow up with Dr. Caryl Dalton of  [facility] by calling for an appointment.   ADDITIONAL SECONDARY DIAGNOSES:  Diabetes.   LAB STUDIES THIS ADMISSION:  Sodium 142, potassium 3.7, chloride 109,  carbonate is 27, BUN is 10, creatinine 1.2. Complete blood count on day  of discharge, white cells 6.6, hemoglobin 13.8, hematocrit 42.7,  platelets are 298. Lipid profile cholesterol 212, triglycerides 219, HDL  cholesterol 25, LDL cholesterol 143.      Sueanne Margarita, PA     GM/MEDQ  D:  04/09/2007  T:  04/09/2007  Job:  [id number]

## 2010-08-20 NOTE — Assessment & Plan Note (Signed)
[facility]                         ELECTROPHYSIOLOGY OFFICE NOTE   NAME:WHITSETTViraj, Mcinerny                    MRN:          [id number]  DATE:02/08/2008                            DOB:          [birth year]    HISTORY OF PRESENT ILLNESS:  [name] is seen in followup for an ICD  implanted in the setting of nonischemic heart disease and congestive  heart failure.  He is doing pretty well.  He denies shortness of breath  or peripheral edema.   MEDICATIONS:  His medications managed by Dr. Terrence Dupont include  1. Isordil 20 t.i.d.  2. Furosemide 20.  3. Hydralazine 50 t.i.d.  4. Simvastatin.  5. Carvedilol 25 b.i.d.  6. Amlodipine 10.  7. Aspirin.  8. Lisinopril 10.   PHYSICAL EXAMINATION:  VITAL SIGNS:  His blood pressure 132/84, the  pulse of 74.  LUNGS:  Clear.  HEART:  The heart sounds were regular.  EXTREMITIES:  Without edema.  NEUROLOGIC:  Grossly normal.   Interrogation of his Maximo ICD demonstrates an R-wave of 7.4, impedance  of 416 and threshold 3 volts at 0.06 up from 1 volt at 0.1.  Battery  voltage 3.17.  No intercurrent therapies.   IMPRESSION:  1. Nonischemic cardiomyopathy.  2. Congestive heart failure - Class II - Stable.  3. Obesity.  4. Status post implantable cardioverter-defibrillator for the above.   I encourage Mr. Pereyra to work on his weight loss program.  We will  plan to check him again in 3 months' time, at which point his RV  threshold, the importance to reassess.  I will see him again in 1-year.     [name], MD, [facility]  Electronically Signed    SCK/MedQ  DD: 02/08/2008  DT: 02/08/2008  Job #: [id number]   cc:   Allegra Lai. Terrence Dupont, M.D.

## 2010-08-20 NOTE — Discharge Summary (Signed)
NAME:  [name], LORIA NO.:  [id number]   MEDICAL RECORD NO.:  [id number]          PATIENT TYPE:  INP   LOCATION:  2032                         FACILITY:  [facility]   PHYSICIAN:  Shaune Pascal. Bensimhon, MDDATE OF BIRTH:  March 27, 1961   DATE OF ADMISSION:  02/19/2007  DATE OF DISCHARGE:  02/20/2007                               DISCHARGE SUMMARY   CARDIOLOGIST:  Allegra Lai. Terrence Dupont, M.D.  Birdie Riddle, M.D.   ELECTROPHYSIOLOGIST:  [name], MD, [facility]   PRIMARY CARE PHYSICIAN:  None.   REASON FOR ADMISSION:  ICD implantation.   DISCHARGE DIAGNOSES:  1. Status post Medtronic AICD implantation February 19, 2007 secondary      to nonsustained ventricular tachycardia, dilated cardiomyopathy and      Class III congestive heart failure.  2. Nonischemic cardiomyopathy, ejection fraction 20%.  3. Hypertension.  4. Sleep apnea  5. Obesity.  6. Recent admission for acute renal failure.  7. Hyperkalemia.  8. Diet-controlled diabetes mellitus, type 2.   PROCEDURE:  For this admission, AST implantation by Dr. Cristopher Peru,  February 19, 2007.   ADMISSION HISTORY:  Mr. Allegretto is a 50 year old male patient followed  by Dr. Terrence Dupont and Dr. Caryl Comes who was referred for AICD implantation.  This was initially planned for February 12, 2007.  However, the patient  presented with hyperkalemia and acute renal failure.  This was felt to  be secondary to aggressive diuresis.  He was treated for his  hyperkalemia.  He had previously been on HCTZ,  potassium, Micardis HCTZ  and these were all discontinued.  He was continued on lisinopril 2.5 mg  a day.  His AICD implantation was placed on hold secondary to this.  He  was discharged November 10.  He was brought back to [facility]  on the day of admission for AICD implantation with Dr. Lovena Le.   The patient was taken to the EP lab on the day of admission for AICD  implantation.  This was a Medtronic device.  There were  apparently no  immediate complications.  On the morning of February 20, 2007, the  patient was found to be in stable condition.  Interrogation of his ICD  was normal.  His ICD site looked stable.  His follow-up chest x-ray in  the office showed no acute findings.  Dr. Haroldine Laws saw the patient and  felt he was stable enough discharge to home.   LABORATORY DATA:  White count 6200, hemoglobin to 12.5, hematocrit 37.6,  platelet count 303,000, INR 1.1.  Sodium 143, potassium 3.9, glucose  129, BUN 30, creatinine 0.74, calcium 9.2.  Chest x-ray as noted above.   DISCHARGE MEDICATIONS:  1. Norvasc 5 mg daily.  2. Aspirin 3-5 mg daily.  3. Coreg 25 mg b.i.d.  4. Pravastatin 90 mg at bedtime.  5. Bidil as taken previously.  6. Cipro 250 mg daily.   DIET:  Low-fat, low-sodium, diabetic diet.   ACTIVITY:  He has been provided with an instruction sheet regarding  activity.  He is to not raise his arm above his head until after  November 20.  No driving for 10 days and no shower for 1 week.   WOUND CARE:  The patient has been provided instructions regarding wound  care.  He knows to call with any fevers, worsening swelling, or bruising  or drainage.   FOLLOW UP:  1. The patient will have his device interrogated March 08, 2007 at      out office in [place] at 9:40 a.m.  2. He will see Dr. Lovena Le in 3 months and the office will contact with      an appointment.  3. He has been asked to contact Dr. Terrence Dupont in the next two weeks for      followup appointment.   Total physician PA time greater than 30 minutes for discharge.      Richardson Dopp, PA-C      Shaune Pascal. Bensimhon, MD  Electronically Signed    SW/MEDQ  D:  02/20/2007  T:  02/22/2007  Job:  [id number]   cc:   Allegra Lai. Terrence Dupont, M.D.  Birdie Riddle, M.D.  [name], MD, [facility]

## 2010-08-21 ENCOUNTER — Encounter: Payer: Medicare Other | Admitting: *Deleted

## 2010-08-23 NOTE — Discharge Summary (Signed)
[facility]  Patient:    [name]                      MRN: [id number] Adm. Date:  10/29/00 Disc. Date: 10/31/00 Attending:  Melburn Hake, M.D.                           Discharge Summary  ADMITTING DIAGNOSIS:  Severe midsternal chest pain radiating to the left shoulder.  DISCHARGE DIAGNOSES: 1. Severe hypertension. 2. Chest pain. 3. Cardiomyopathy with borderline ejection fraction in this 50 year old male.  BRIEF HISTORY/HOSPITAL COURSE:  The patient is a 50 year old African-American male who was at work as a Public librarian and he developed a severe midsternal chest pain that radiated to his left shoulder.  He states that he has never had pain of this nature.  The patient had hypertension, being treated with three medications; Verapamil 180 mg SR, Altace 20 mg daily, Maxzide 25. However, on admission, he had a blood pressure of 194/112.  He had some Q-wave changes in his enzymes with some elevated CPKs, but troponin was not elevated.  The patient lives with his children, his children are 56 and 47 years of age. He is divorced.  Physical exam revealed at the time of exam, his blood pressure was 161/113, pulse was 73, respirations 18, temperature was 99.0.  The patient was alert and felt some better once he got to the floor.  He also felt some better after sublingual nitroglycerin.  HEENT was negative.  Chest was clear to auscultation, percussion and regular sinus rhythm.  Abdomen was soft, no masses, no organomegaly.  He is somewhat obese.  He has active bowel sounds. The rest of the physical was essentially negative.  The patient had a fairly elevated cholesterol in the office and being borderline cardiomegaly.  We asked cardiology to see him to see if he could get a Cardiolite study, which was done.  The patient did not have any blockages, however, he had a myopathy and also his ejection fraction was only 42%.  This was low for  a 50 year old male and decided to treat him with some beta blockers and ACE inhibitors and the patient was doing much better.  DISCHARGE MEDICATIONS: 1. Coreg 3.125 mg twice a day. 2. Maxzide 25. 3. Altace 20 mg a day. 4. Verapamil 240 mg SR twice a day. 5. Zosyn 20 mg daily.  FOLLOWUP:  Follow up with me in a couple of weeks and also with Dr. Terrence Dupont, the cardiologist. DD:  11/15/00 TD:  11/15/00 Job: [id number]

## 2010-08-23 NOTE — H&P (Signed)
NAME:  [name], [name]             ACCOUNT NO.:  [id number]   MEDICAL RECORD NO.:  [id number]          PATIENT TYPE:  INP   LOCATION:  [id number]                         FACILITY:  [facility]   PHYSICIAN:  Birdie Riddle, M.D.  DATE OF BIRTH:  1960/07/19   DATE OF ADMISSION:  01/26/2005  DATE OF DISCHARGE:                                HISTORY & PHYSICAL   REFERRING PHYSICIAN:  Mohan N. Terrence Dupont, M.D.   CHIEF COMPLAINT:  Chest pain.   HISTORY OF PRESENT ILLNESS:  This 50 year old black male presented to the  emergency room with shortness of breath and chest pain without cough, cold  or fever.   Past medical history  The patient has a past medical history of noninsulin-dependent diabetes  mellitus, hypercholesterolemia, congestive heart failure, hypertension,  obesity.   PAST SURGICAL HISTORY:  Bilateral eye surgery and right knee surgery.   SOCIAL HISTORY:  The patient is single. Has three children. No tobacco or  alcohol abuse. Head of janitorial work and Nurse, learning disability work. He was born  in [place] and living in [place].   FAMILY HISTORY:  Father died of cancer. Mother is alive with diabetes and  hypertension. One sister died of cancer. She also had myocardial infarction  and she was diabetic. Another sister died in her 43s. One brother is  diabetic.   MEDICATIONS:  1.  Altace 10 mg 1 capsule twice daily.  2.  Coreg 25 mg 1 twice daily  3.  Micardis 80 mg 1 daily.  4.  Clonidine 0.2 mg 1 twice daily.  5.  Zocor 40 mg 1 daily at bed time.  6.  Baby aspirin 81 mg 1 daily.   ALLERGIES:  No known drug allergies.   PHYSICAL EXAMINATION:  VITAL SIGNS:  Pulse 74, respiration 28, blood  pressure 160/101.  GENERAL:  The patient was alert and oriented x3 in no acute distress.  HEENT:  Eyes brown. Conjunctivae pink.  NECK:  Supple. No JVD, no carotid bruits.  LUNGS:  Bibasilar crackles.  HEART:  Normal S1 and S2 with a soft S3 gallop.  ABDOMEN:  Soft. Obese, nontender.  EXTREMITIES:  No clubbing, cyanosis, or edema.   LABORATORY DATA:  Chest x-ray with no obvious edema but significant  cardiomegaly.   Sodium 144, potassium down at 2.6, chloride 106, glucose 131. Hemoglobin 15,  hematocrit 44, creatinine 1.1. CK-MB and troponin I normal. B-type  natriuretic peptide elevated at 834.   ASSESSMENT:  1.  Early congestive heart failure.  2.  Chest pain.  3.  Dilated cardiomyopathy.  4.  Diabetes mellitus.  5.  Hypertension.   PLAN:  Give IV Lasix and admit the patient to telemetry unit. Start IV  nitroglycerin for blood pressure control and for treatment of heart failure.  Give home medications that include ACE inhibitors and supplemental oxygen.      Birdie Riddle, M.D.  Electronically Signed     ASK/MEDQ  D:  01/26/2005  T:  01/26/2005  Job:  [id number]

## 2010-08-23 NOTE — Discharge Summary (Signed)
NAMEMAMADY, ARTZER             ACCOUNT NO.:  [id number]   MEDICAL RECORD NO.:  [id number]          PATIENT TYPE:  INP   LOCATION:  2008                         FACILITY:  [facility]   PHYSICIAN:  Birdie Riddle, M.D.  DATE OF BIRTH:  November 06, 1960   DATE OF ADMISSION:  01/26/2005  DATE OF DISCHARGE:  01/30/2005                                 DISCHARGE SUMMARY   REFERRING PHYSICIAN:  Mohan N. Terrence Dupont, M.D.   PRINCIPAL DIAGNOSES:  1.  Congestive heart failure.  2.  Dilated cardiomyopathy.  3.  Hypertension.  4.  Obstructive sleep apnea.  5.  Obesity.  6.  Long QT syndrome.  7.  Diabetes mellitus, type 2.  8.  Long-term use of aspirin and long-term use of antiplatelets and      antithrombotics.   PRINCIPAL PROCEDURE:  Left heart catheterization, left coronary angiography  done by Dr. Birdie Riddle on January 29, 2005.   DISCHARGE MEDICATIONS:  1.  Altace 10 mg daily.  2.  Micardis 80 mg daily.  3.  Lipitor 40 mg daily.  4.  Kay Ciel 20 mEq b.i.d.  5.  Coreg 6.25 mg b.i.d.  6.  BiDil one t.i.d.  7.  Lasix 40 mg b.i.d.  8.  Clonidine 0.2 mg t.i.d.  9.  Aspirin 325 mg daily.  10. Plavix 75 mg one daily.   DISCHARGE DIET:  Low fat, low salt, 100 mL fluid restriction diet.   ACTIVITY:  Increase activity slowly.   WOUND CARE:  The patient is to notify if right groin pain, swelling, or  discharge.   FOLLOWUP:  With Dr. Allegra Lai. Harwani in one week.   SPECIAL INSTRUCTIONS:  The patient is to follow heart failure booklet  instructions.   HISTORY:  This 50 year old black male presented to the emergency room with  shortness of breath and chest pain without cough, cold, or fever. His past  medical history was positive for non-insulin-dependent diabetes mellitus,  hypercholesterolemia, congestive heart failure, hypertension, and obesity.   PHYSICAL EXAMINATION:  VITAL SIGNS: Pulse 75, respirations 28, blood  pressure 160/101.  GENERAL: The patient is alert and oriented times  three, in no distress.  HEENT: Eyes brown. Conjunctiva pink. Sclera nonicteric. Mucous membranes  pink and moist.  NECK: Supple with no JVD or carotid bruits.  LUNGS: Bibasilar crackles.  HEART: Normal S1 and S2. A soft S3 gallop.  ABDOMEN: Soft, obese, nontender.  EXTREMITIES: No clubbing, cyanosis, or edema.  CNS: Grossly intact. The patient moves all 4 extremities.   LABORATORY DATA:  Normal hemoglobin, hematocrit, WBC count, platelet count.  Normal PT-INR. Normal electrolytes except for potassium of 2.6 with  subsequent potassium improved to 3.3. CK-MB negative with a borderline high  troponin-I of 0.05. Cholesterol 166, triglycerides 102, HDL cholesterol 26,  LDL cholesterol of 120. Nuclear stress test showed progressive global  hypokinesia with an ejection fraction of 20% and nonsignificant reversible  perfusion defect with a peri-infarct ischemia of anteroapical segment. Chest  x-ray revealed cardiomegaly and chronic vascular congestion. Ultrasound of  the heart revealed dilated left ventricle with 25% to 30% ejection fraction,  mild mitral valvular regurgitation, and moderately to markedly dilated left  atrium.   Cardiac catheterization showed normal coronaries with a ejection fraction of  20% and dilated heart.   HOSPITAL COURSE:  The patient was admitted to the telemetry unit. He  underwent a nuclear stress test that showed minimal ischemia and further  decrease in left ventricular systolic function. Hence, the patient underwent  cardiac catheterization that showed normal coronaries, but poor left  ventricular systolic function. He had an EP consult and recommended that I  or Dr.Harwani repeat his ejection fraction in about three months for further  management of this patient with dilated cardiomyopathy. Overall patient's  condition improved with diuresis, fluid restriction, and medications, and  she was discharged home in satisfactory condition with follow-up by Dr.   Terrence Dupont in one week.      Birdie Riddle, M.D.  Electronically Signed     ASK/MEDQ  D:  04/17/2005  T:  04/17/2005  Job:  [id number]

## 2010-08-23 NOTE — Cardiovascular Report (Signed)
NAME:  AISHA, OBERMANN                       ACCOUNT NO.:  [id number]   MEDICAL RECORD NO.:  [id number]                   PATIENT TYPE:  OIB   LOCATION:  2899                                 FACILITY:  [facility]   PHYSICIAN:  Allegra Lai. Terrence Dupont, M.D.              DATE OF BIRTH:  March 30, 1961   DATE OF PROCEDURE:  10/24/2003  DATE OF DISCHARGE:                              CARDIAC CATHETERIZATION   PROCEDURE:  Left cardiac catheterization with selective left and right  coronary angiography, aortography,  and left ventriculography, visualization  of bilateral renal arteries via the right groin using Judkins technique.   INDICATIONS:  Mr. Shippen is a 50 year old black male with past medical  history significant for hypertension, hypertensive heart disease with  systolic dysfunction, noninsulin-dependent diabetes mellitus controlled by  diet, hypercholesterolemia, morbid obesity, positive family history of  coronary artery disease.  He came to the Emergency Room at [facility] complaining of retrosternal chest tightness radiating to left  shoulder associated with mild shortness of breath, grade 5/10, which woke  him up from sleep.  States he is out of his medication for last few days.  The patient also gives history of exertional chest tightness while working  in the yard.  Denies palpitation, lightheadedness or syncope.  Denies PND,  orthopnea or leg swelling.  EKG done in the emergency room showed normal  sinus rhythm with minor T-wave inversion in lateral leads.  Presently, when  seen in the emergency room was chest pain free.   PAST MEDICAL HISTORY:  As above.   PAST SURGICAL HISTORY:  Had laser eye surgery in both eyes in the past.  Had  right knee surgery in the past.  Had Persantine Cardiolite in July 2002  which was negative for ischemia with EF of 42.   MEDICATIONS AT HOME:  1. Coreg 25 mg p.o. q.12h.  2. Lotrel 10/20 one capsule daily.  3. Micardis 40 mg p.o.  daily.  4. Triamterene HCT 37.5/25 p.o. daily.  5. Clonidine 0.2 mg p.o. b.i.d.  6. Zocor 20 mg p.o. h.s.  7. Baby aspirin.   SOCIAL HISTORY:  He is single and has three children.  No history of smoking  or alcohol abuse.  Does janitorial work.  Born in [place].  Lives  in [place].   FAMILY HISTORY:  Positive for coronary artery disease.   The patient was admitted at [facility].  Myocardial infarction was  ruled out by serial enzymes and EKG.  Discussed at that time with patient  regarding various options of treatment; i.e., noninvasive stress testing  versus left catheterization, possible percutaneous transluminal coronary  angioplasty and stenting.  The patient had emergency in the family and  signed out against medical advice, but consented for left cath, possible  percutaneous transluminal coronary angioplasty and stenting as outpatient.   PROCEDURE:  After obtaining informed consent, the patient was brought to the  cath  lab and was placed on fluoroscopy table. Right groin was prepped and  draped in usual fashion.  2% Xylocaine was used for local anesthesia in the  right groin.  With the help of thin-wall needle, a 6-French arterial sheath  was placed.  The sheath was aspirated and flushed.  Next, 6-French left  Judkins catheter was advanced over the wire under fluoroscopic guidance up  to the ascending aorta.  Wire was pulled out and the catheter was aspirated  and connected to the manifold. Catheter was further advanced and engaged  into left coronary ostium.  Multiple views of the left system were taken.  Next, the catheter was disengaged and was pulled out over the wire and was  replaced with 6-French right Judkins catheter which was advanced over the  wire under fluoroscopic guidance to the ascending aorta.  Wire was pulled  out, the catheter was aspirated and connected to the manifold.  Catheter was  further advanced and engaged into right coronary  ostium.  Multiple views of  the right system were taken.  Next, the catheter was disengaged and was  pulled out over the wire and was replaced with 6-French pigtail catheter  which was advanced over wire under fluoroscopic guidance up to the ascending  aorta.  Wire was pulled out, the catheter was aspirated and connected to the  manifold.  Catheter was further advanced across the aortic valve into the  left ventricle.  Left ventricular pressures were recorded.  Next, left  ventriculography was done in 30-degree RAO position.  Post angiographic  pressures were recorded from LV and then pullback pressures were recorded  from the aorta.  There was no gradient across the aortic valve.  Next, the  pigtail catheter was pulled down up to the abdominal aorta.  Aortography was  done in PA position.  Next, the pigtail catheter was pulled out over the  wire, sheaths aspirated and flushed.   FINDINGS:  The LV showed mild LVH, EF of 45-50%.  Left main was patent.  LAD  was patent proximally and in mid portion and distally the vessel was  diffusely narrow and small vessel distally.  Diagonal one and diagonal two  were very small, but patent.  Left circumflex was large which was patent.  OM-1 and OM-2 were large which were patent.  RCA was patent.  Aortography  showed no aortic aneurysm.  Bilateral renal arteries were patent.  The  patient tolerated the procedure well.  There were no complications.  The  patient was transferred to recovery room in stable condition.                                               Allegra Lai. Terrence Dupont, M.D.    MNH/MEDQ  D:  10/24/2003  T:  10/24/2003  Job:  [id number]   cc:   Melburn Hake, M.D.  [address] [place]  [place] [address]  Fax: [phone]

## 2010-08-23 NOTE — Cardiovascular Report (Signed)
NAME:  [name], [name]             ACCOUNT NO.:  [id number]   MEDICAL RECORD NO.:  [id number]          PATIENT TYPE:  INP   LOCATION:  2008                         FACILITY:  [facility]   PHYSICIAN:  Birdie Riddle, M.D.  DATE OF BIRTH:  Jan 15, 1961   DATE OF PROCEDURE:  01/29/2005  DATE OF DISCHARGE:                              CARDIAC CATHETERIZATION   PROCEDURE:  Left heart catheterization, selective coronary angiography, left  renal function study.   INDICATIONS FOR PROCEDURE:  This 50 year old black male had chest pain with  abnormal nuclear stress test along with hypertension and congestive heart  failure.   APPROACH:  Right femoral artery using 4 French sheath.   COMPLICATIONS:  None.   HEMODYNAMIC DATA:  The left ventricular pressure was 148/19 and aortic  pressure was 148/109.  This is after using nitroglycerin sublingual spray.   LEFT VENTRICULOGRAM:  This showed dilated left ventricle with global  hypokinesia, ejection fraction 20%.   CORONARY ANATOMY:  The left main coronary artery was unremarkable.   The left anterior descending  was also unremarkable.  Diagonal vessels one,  two, three, and four were small vessels.   Left circumflex coronary artery was also unremarkable with obtuse marginal  branches one and two also normal.  The ramus branch was very small.   Right coronary artery was also unremarkable.  It had a very small  posterolateral branch and the posterior descending artery was unremarkable.   IMPRESSION:  1.  Normal coronaries.  2.  Dilated cardiomyopathy.   RECOMMENDATIONS:  This patient will be treated medically and will have  future EP consult for ICD/BiV placement.      Birdie Riddle, M.D.  Electronically Signed    ASK/MEDQ  D:  01/29/2005  T:  01/29/2005  Job:  [id number]

## 2010-08-23 NOTE — Discharge Summary (Signed)
NAME:  [name], [name]             ACCOUNT NO.:  [id number]   MEDICAL RECORD NO.:  [id number]          PATIENT TYPE:  INP   LOCATION:  3736                         FACILITY:  [facility]   PHYSICIAN:  Mohan N. Terrence Dupont, M.D. DATE OF BIRTH:  [birth year]   DATE OF ADMISSION:  10/29/2004  DATE OF DISCHARGE:  11/01/2004                                 DISCHARGE SUMMARY   ADMISSION DIAGNOSIS:  1.  Atypical chest pain, rule out myocardial infarction, rule out pulmonary      embolus, rule out dissection.  2.  Decompensated congestive heart failure.  3.  Hypertensive urgency secondary to noncompliance to medications.  4.  Non insulin dependent diabetes mellitus controlled by diet.  5.  Morbid obesity.  6.  Hypercholesterolemia.  7.  Obesity hypoventilation syndrome.  8.  Positive family history of coronary artery disease.  9.  Hypokalemia secondary to diuretics.  10. Bronchitis.   DISCHARGE DIAGNOSIS:  1.  Status post atypical chest pain, myocardial infarction ruled out,      negative pulmonary embolus or dissection.  2.  Borderline mediastinal lymphadenopathy versus prominent left atrial      appendage.  3.  Compensated congestive heart failure status post hypertensive urgency.  4.  Non insulin dependent diabetes mellitus controlled by diet.  5.  Obesity.  6.  Obesity hypoventilation syndrome.  7.  Hypercholesterolemia.  8.  Positive family history of coronary artery disease.  9.  Status post bronchitis.  10. Status post hypokalemia.   DISCHARGE MEDICATIONS:  Altace 10 mg 1 capsule b.i.d., Coreg 25 mg 1 tablet  every 12 hours, Micardis 80 mg 1 tablet daily, Clonidine 0.2 mg 1 tablet  b.i.d., Zocor 40 mg 1 tablet daily at night, baby aspirin 81 mg 1 tablet  daily.   DISCHARGE INSTRUCTIONS:  Diet low salt, low cholesterol, 1800 calorie ADA  diet.  The patient has been advised to avoid sweets and lose weight.  Activities as tolerated.  The patient has been advised to take medications  regularly and monitor blood pressure and blood sugar.  Follow up with me in  one week.   CONDITION ON DISCHARGE:  Stable.   BRIEF HISTORY:  Mr. Bierl is a 50 year old black male with past medical  history significant for hypertensive heart disease with systolic  dysfunction, EF in the range of 40-45%, mild coronary artery disease,  history of CHF, non-insulin dependent diabetes mellitus controlled by diet,  hypercholesterolemia, morbid obesity, obesity hyperventilation syndrome,  came to the ER complaining of progression of wheezing, shortness of breath  associated with cough, and leg swelling for the last three weeks.  He states  he has been having a dry cough and choking sensation associated with  pleuritic chest pain.  He denies any fever, chills.  He states some of his  medications were recently changed but does not recall the  names.  He denies  any palpitations, light headedness, or syncope.  The patient is noncompliant  with medications.  Denies PND and orthopnea.  Complains of leg swelling.  The patient also gives a history of exertional dyspnea with minimal  exertion.  PAST MEDICAL HISTORY:  As above.   PAST SURGICAL HISTORY:  He has had bilateral eye surgery in the past, he had  right knee surgery in the past.   SOCIAL HISTORY:  He is single, has three children, no tobacco or alcohol  abuse.  He does janitorial work and cuts grass.  He was born in [place], he lives in [place].   FAMILY HISTORY:  Father died of cancer.  Mother is alive, she is  hypertensive and diabetic.  One sister died of cancer, she had MI, she was  diabetic. One sister died of MI in her 67s.  One brother is diabetic.   MEDICATIONS AT HOME:  He was on Coreg, Lipitor, Micardis, and questionable  diuretic.   PHYSICAL EXAMINATION:  On examination, he is alert, awake, oriented x 3, in  no acute distress.  Blood pressure 177/124, pulse 176 and regular.  Conjunctivae pink.  Neck supple,  no JVD, no bruits.  Lungs:  He has  bibasilar rales.  Cardiovascular:  Normal S1 and S2, there was soft S3, and  S4 gallop.  Abdomen was soft, bowel sounds present, obese, nontender.  Extremities:  No clubbing or cyanosis, there was trace edema.   LABORATORY DATA:  Cholesterol 93, triglycerides 157, HDL low at 24, LDL 138.  BNP 833.  CK 560, MB 2.9, second set 466, MB 2.5.  Troponin I was slightly  elevated 0.07, 0.05, and 0.05.  Sodium 144, potassium 2.3, chloride 108,  bicarb 26, glucose 129, BUN 13, creatinine 1.5.  Repeat electrolytes on July  27, sodium 139, potassium 2.8, chloride 102, bicarb 29, glucose 122, BUN 16,  creatinine 1.7.  Today, potassium is 3.8, BUN 15, creatinine 1.5, glucose  125.  Hemoglobin 13.1, hematocrit 39.1, white count 7.4.   HOSPITAL COURSE:  The patient was admitted to telemetry unit, MI was ruled  out by serial enzymes and EKG.  The patient did not have any episodes of  chest pain during hospital stay.  The patient was started on IV nitrates and  restarted on all his medications.  Potassium was replaced.  The patient was  also started on IV Zithromax with resolution of his choking sensation and  coughing.  The patient remained afebrile during her hospital stay, his blood  pressure is fairly well controlled.  The patient will be discharged home on  above medications and will be followed up in my office in one week.       MNH/MEDQ  D:  11/01/2004  T:  11/01/2004  Job:  [id number]

## 2010-09-16 ENCOUNTER — Ambulatory Visit (INDEPENDENT_AMBULATORY_CARE_PROVIDER_SITE_OTHER): Payer: Medicare Other | Admitting: *Deleted

## 2010-09-16 DIAGNOSIS — I472 Ventricular tachycardia: Secondary | ICD-10-CM

## 2010-09-16 DIAGNOSIS — I428 Other cardiomyopathies: Secondary | ICD-10-CM

## 2010-09-16 NOTE — Progress Notes (Signed)
icd check in clinic  

## 2010-09-20 ENCOUNTER — Ambulatory Visit: Payer: Medicare Other | Admitting: Family Medicine

## 2010-11-25 ENCOUNTER — Other Ambulatory Visit: Payer: Self-pay | Admitting: Internal Medicine

## 2010-12-18 ENCOUNTER — Ambulatory Visit (INDEPENDENT_AMBULATORY_CARE_PROVIDER_SITE_OTHER): Payer: Medicare Other | Admitting: *Deleted

## 2010-12-18 ENCOUNTER — Encounter: Payer: Self-pay | Admitting: Internal Medicine

## 2010-12-18 DIAGNOSIS — I5022 Chronic systolic (congestive) heart failure: Secondary | ICD-10-CM

## 2010-12-18 DIAGNOSIS — I428 Other cardiomyopathies: Secondary | ICD-10-CM

## 2010-12-18 NOTE — Progress Notes (Signed)
icd check in clinic  

## 2011-01-14 LAB — BASIC METABOLIC PANEL
BUN: 30 — ABNORMAL HIGH
BUN: 30 — ABNORMAL HIGH
BUN: 34 — ABNORMAL HIGH
BUN: 38 — ABNORMAL HIGH
BUN: 59 — ABNORMAL HIGH
BUN: 62 — ABNORMAL HIGH
BUN: 66 — ABNORMAL HIGH
BUN: 71 — ABNORMAL HIGH
BUN: 75 — ABNORMAL HIGH
BUN: 77 — ABNORMAL HIGH
CO2: 22
CO2: 23
CO2: 24
CO2: 25
CO2: 26
CO2: 26
CO2: 26
CO2: 27
CO2: 27
CO2: 27
Calcium: 10
Calcium: 8.8
Calcium: 8.8
Calcium: 8.9
Calcium: 9
Calcium: 9.1
Calcium: 9.2
Calcium: 9.3
Calcium: 9.5
Calcium: 9.8
Chloride: 104
Chloride: 105
Chloride: 105
Chloride: 105
Chloride: 106
Chloride: 106
Chloride: 107
Chloride: 107
Chloride: 108
Chloride: 112
Creatinine, Ser: 1.71 — ABNORMAL HIGH
Creatinine, Ser: 1.73 — ABNORMAL HIGH
Creatinine, Ser: 1.74 — ABNORMAL HIGH
Creatinine, Ser: 1.89 — ABNORMAL HIGH
Creatinine, Ser: 2.49 — ABNORMAL HIGH
Creatinine, Ser: 2.85 — ABNORMAL HIGH
Creatinine, Ser: 3.06 — ABNORMAL HIGH
Creatinine, Ser: 3.31 — ABNORMAL HIGH
Creatinine, Ser: 3.57 — ABNORMAL HIGH
Creatinine, Ser: 3.58 — ABNORMAL HIGH
GFR calc Af Amer: 22 — ABNORMAL LOW
GFR calc Af Amer: 22 — ABNORMAL LOW
GFR calc Af Amer: 24 — ABNORMAL LOW
GFR calc Af Amer: 27 — ABNORMAL LOW
GFR calc Af Amer: 29 — ABNORMAL LOW
GFR calc Af Amer: 34 — ABNORMAL LOW
GFR calc Af Amer: 46 — ABNORMAL LOW
GFR calc Af Amer: 51 — ABNORMAL LOW
GFR calc Af Amer: 51 — ABNORMAL LOW
GFR calc Af Amer: 52 — ABNORMAL LOW
GFR calc non Af Amer: 18 — ABNORMAL LOW
GFR calc non Af Amer: 18 — ABNORMAL LOW
GFR calc non Af Amer: 20 — ABNORMAL LOW
GFR calc non Af Amer: 22 — ABNORMAL LOW
GFR calc non Af Amer: 24 — ABNORMAL LOW
GFR calc non Af Amer: 28 — ABNORMAL LOW
GFR calc non Af Amer: 38 — ABNORMAL LOW
GFR calc non Af Amer: 42 — ABNORMAL LOW
GFR calc non Af Amer: 42 — ABNORMAL LOW
GFR calc non Af Amer: 43 — ABNORMAL LOW
Glucose, Bld: 115 — ABNORMAL HIGH
Glucose, Bld: 129 — ABNORMAL HIGH
Glucose, Bld: 135 — ABNORMAL HIGH
Glucose, Bld: 137 — ABNORMAL HIGH
Glucose, Bld: 138 — ABNORMAL HIGH
Glucose, Bld: 144 — ABNORMAL HIGH
Glucose, Bld: 146 — ABNORMAL HIGH
Glucose, Bld: 152 — ABNORMAL HIGH
Glucose, Bld: 155 — ABNORMAL HIGH
Glucose, Bld: 98
Potassium: 3.9
Potassium: 4.4
Potassium: 4.4
Potassium: 4.7
Potassium: 4.7
Potassium: 4.8
Potassium: 4.9
Potassium: 5.3 — ABNORMAL HIGH
Potassium: 7.4
Potassium: >7.5
Sodium: 133 — ABNORMAL LOW
Sodium: 136
Sodium: 137
Sodium: 138
Sodium: 139
Sodium: 140
Sodium: 140
Sodium: 140
Sodium: 141
Sodium: 143

## 2011-01-14 LAB — APTT
aPTT: 32
aPTT: 33

## 2011-01-14 LAB — CBC
HCT: 37.6 — ABNORMAL LOW
HCT: 44.1
Hemoglobin: 12.5 — ABNORMAL LOW
Hemoglobin: 14.6
MCHC: 33.1
MCHC: 33.2
MCV: 81.9
MCV: 83.7
Platelets: 303
Platelets: 336
RBC: 4.58
RBC: 5.28
RDW: 14.4
RDW: 15.1 — ABNORMAL HIGH
WBC: 6.2
WBC: 7.1

## 2011-01-14 LAB — POCT CARDIAC MARKERS
CKMB, poc: 1
Myoglobin, poc: 168
Operator id: 270111
Troponin i, poc: <0.05

## 2011-01-14 LAB — CREATININE, SERUM
Creatinine, Ser: 2 — ABNORMAL HIGH
GFR calc Af Amer: 43 — ABNORMAL LOW
GFR calc non Af Amer: 36 — ABNORMAL LOW

## 2011-01-14 LAB — PROTIME-INR
INR: 1
INR: 1.1
Prothrombin Time: 13.8
Prothrombin Time: 14

## 2011-01-14 LAB — POCT I-STAT 4, (NA,K, GLUC, HGB,HCT)
Glucose, Bld: 126 — ABNORMAL HIGH
HCT: 49
Hemoglobin: 16.7
Operator id: 181601
Potassium: 7.6
Sodium: 136

## 2011-01-15 LAB — CBC
HCT: 34.2 — ABNORMAL LOW
HCT: 36 — ABNORMAL LOW
HCT: 37.5 — ABNORMAL LOW
HCT: 38.8 — ABNORMAL LOW
HCT: 42.7
Hemoglobin: 11.3 — ABNORMAL LOW
Hemoglobin: 11.8 — ABNORMAL LOW
Hemoglobin: 12.5 — ABNORMAL LOW
Hemoglobin: 12.7 — ABNORMAL LOW
Hemoglobin: 13.8
MCHC: 32.3
MCHC: 32.8
MCHC: 32.8
MCHC: 33.1
MCHC: 33.3
MCV: 82.8
MCV: 83.1
MCV: 83.2
MCV: 83.4
MCV: 84.9
Platelets: 261
Platelets: 265
Platelets: 280
Platelets: 298
Platelets: 316
RBC: 4.11 — ABNORMAL LOW
RBC: 4.32
RBC: 4.53
RBC: 4.66
RBC: 5.03
RDW: 14.9 — ABNORMAL HIGH
RDW: 15.1 — ABNORMAL HIGH
RDW: 15.3 — ABNORMAL HIGH
RDW: 15.5 — ABNORMAL HIGH
RDW: 15.6 — ABNORMAL HIGH
WBC: 6.6
WBC: 7.1
WBC: 7.7
WBC: 8.1
WBC: 8.9

## 2011-01-15 LAB — BASIC METABOLIC PANEL
BUN: 17
BUN: 17
BUN: 20
BUN: 21
CO2: 29
CO2: 32
CO2: 33 — ABNORMAL HIGH
CO2: 33 — ABNORMAL HIGH
Calcium: 8.9
Calcium: 9.4
Calcium: 9.4
Calcium: 9.9
Chloride: 100
Chloride: 101
Chloride: 102
Chloride: 109
Creatinine, Ser: 1.44
Creatinine, Ser: 1.56 — ABNORMAL HIGH
Creatinine, Ser: 1.62 — ABNORMAL HIGH
Creatinine, Ser: 1.66 — ABNORMAL HIGH
GFR calc Af Amer: 54 — ABNORMAL LOW
GFR calc Af Amer: 55 — ABNORMAL LOW
GFR calc Af Amer: 58 — ABNORMAL LOW
GFR calc Af Amer: >60
GFR calc non Af Amer: 44 — ABNORMAL LOW
GFR calc non Af Amer: 46 — ABNORMAL LOW
GFR calc non Af Amer: 48 — ABNORMAL LOW
GFR calc non Af Amer: 52 — ABNORMAL LOW
Glucose, Bld: 117 — ABNORMAL HIGH
Glucose, Bld: 122 — ABNORMAL HIGH
Glucose, Bld: 127 — ABNORMAL HIGH
Glucose, Bld: 92
Potassium: 3.6
Potassium: 3.8
Potassium: 3.9
Potassium: 4.1
Sodium: 142
Sodium: 142
Sodium: 143
Sodium: 143

## 2011-01-15 LAB — PROTIME-INR
INR: 1.2
Prothrombin Time: 15.4 — ABNORMAL HIGH

## 2011-01-15 LAB — I-STAT 8, (EC8 V) (CONVERTED LAB)
Acid-Base Excess: 1
BUN: 10
Bicarbonate: 26.9 — ABNORMAL HIGH
Chloride: 109
Glucose, Bld: 118 — ABNORMAL HIGH
HCT: 43
Hemoglobin: 14.6
Operator id: 284141
Potassium: 3.7
Sodium: 142
TCO2: 28
pCO2, Ven: 44 — ABNORMAL LOW
pH, Ven: 7.394 — ABNORMAL HIGH

## 2011-01-15 LAB — TROPONIN I
Troponin I: 0.05
Troponin I: 0.06
Troponin I: 0.07 — ABNORMAL HIGH

## 2011-01-15 LAB — B-NATRIURETIC PEPTIDE (CONVERTED LAB)
Pro B Natriuretic peptide (BNP): 164 — ABNORMAL HIGH
Pro B Natriuretic peptide (BNP): 238 — ABNORMAL HIGH
Pro B Natriuretic peptide (BNP): 398 — ABNORMAL HIGH

## 2011-01-15 LAB — LIPID PANEL
Cholesterol: 212 — ABNORMAL HIGH
HDL: 25 — ABNORMAL LOW
LDL Cholesterol: 143 — ABNORMAL HIGH
Total CHOL/HDL Ratio: 8.5
Triglycerides: 219 — ABNORMAL HIGH
VLDL: 44 — ABNORMAL HIGH

## 2011-01-15 LAB — POCT CARDIAC MARKERS
CKMB, poc: 1.4
CKMB, poc: 1.7
Myoglobin, poc: 84.3
Myoglobin, poc: 85.4
Operator id: 284141
Operator id: 284141
Troponin i, poc: <0.05
Troponin i, poc: <0.05

## 2011-01-15 LAB — CK TOTAL AND CKMB (NOT AT ARMC)
CK, MB: 2.1
CK, MB: 2.4
CK, MB: 2.5
Relative Index: 1.2
Relative Index: 1.4
Relative Index: 1.5
Total CK: 154
Total CK: 169
Total CK: 196

## 2011-01-15 LAB — MAGNESIUM: Magnesium: 1.9

## 2011-01-15 LAB — POCT I-STAT CREATININE
Creatinine, Ser: 1.2
Operator id: 284141

## 2011-01-15 LAB — HEPARIN LEVEL (UNFRACTIONATED)
Heparin Unfractionated: 0.12 — ABNORMAL LOW
Heparin Unfractionated: 0.25 — ABNORMAL LOW
Heparin Unfractionated: 0.29 — ABNORMAL LOW

## 2011-01-15 LAB — D-DIMER, QUANTITATIVE: D-Dimer, Quant: 0.3

## 2011-01-15 LAB — APTT: aPTT: 99 — ABNORMAL HIGH

## 2011-03-10 ENCOUNTER — Encounter: Payer: Medicare Other | Admitting: *Deleted

## 2011-03-20 ENCOUNTER — Encounter: Payer: Self-pay | Admitting: Internal Medicine

## 2011-03-20 ENCOUNTER — Ambulatory Visit (INDEPENDENT_AMBULATORY_CARE_PROVIDER_SITE_OTHER): Payer: Medicare Other | Admitting: *Deleted

## 2011-03-20 DIAGNOSIS — I428 Other cardiomyopathies: Secondary | ICD-10-CM

## 2011-03-20 DIAGNOSIS — I5022 Chronic systolic (congestive) heart failure: Secondary | ICD-10-CM

## 2011-03-20 LAB — ICD DEVICE OBSERVATION
BATTERY VOLTAGE: 3.06 V
BRDY-0002RV: 40 {beats}/min
CHARGE TIME: 8.2 s
DEV-0020ICD: NEGATIVE
FVT: 1
PACEART VT: 11
RV LEAD AMPLITUDE: 7.5 mv
RV LEAD IMPEDENCE ICD: 416 Ohm
RV LEAD THRESHOLD: 1 V
TZAT-0001FASTVT: 1
TZAT-0001FASTVT: 3
TZAT-0001FASTVT: 4
TZAT-0001FASTVT: 5
TZAT-0001FASTVT: 6
TZAT-0001SLOWVT: 1
TZAT-0001SLOWVT: 2
TZAT-0001SLOWVT: 3
TZAT-0001SLOWVT: 4
TZAT-0001SLOWVT: 5
TZAT-0001SLOWVT: 6
TZAT-0002FASTVT: NEGATIVE
TZAT-0002FASTVT: NEGATIVE
TZAT-0002FASTVT: NEGATIVE
TZAT-0002FASTVT: NEGATIVE
TZAT-0002SLOWVT: NEGATIVE
TZAT-0002SLOWVT: NEGATIVE
TZAT-0002SLOWVT: NEGATIVE
TZAT-0002SLOWVT: NEGATIVE
TZAT-0002SLOWVT: NEGATIVE
TZAT-0002SLOWVT: NEGATIVE
TZAT-0004FASTVT: 8
TZAT-0005FASTVT: 88 pct
TZAT-0011FASTVT: 10 ms
TZAT-0012FASTVT: 200 ms
TZAT-0012FASTVT: 200 ms
TZAT-0012FASTVT: 200 ms
TZAT-0012FASTVT: 200 ms
TZAT-0012FASTVT: 200 ms
TZAT-0012SLOWVT: 200 ms
TZAT-0012SLOWVT: 200 ms
TZAT-0012SLOWVT: 200 ms
TZAT-0012SLOWVT: 200 ms
TZAT-0012SLOWVT: 200 ms
TZAT-0012SLOWVT: 200 ms
TZAT-0013FASTVT: 1
TZAT-0018FASTVT: NEGATIVE
TZAT-0018FASTVT: NEGATIVE
TZAT-0018FASTVT: NEGATIVE
TZAT-0018FASTVT: NEGATIVE
TZAT-0018FASTVT: NEGATIVE
TZAT-0018SLOWVT: NEGATIVE
TZAT-0018SLOWVT: NEGATIVE
TZAT-0018SLOWVT: NEGATIVE
TZAT-0018SLOWVT: NEGATIVE
TZAT-0018SLOWVT: NEGATIVE
TZAT-0018SLOWVT: NEGATIVE
TZAT-0019FASTVT: 8 V
TZAT-0019FASTVT: 8 V
TZAT-0019FASTVT: 8 V
TZAT-0019FASTVT: 8 V
TZAT-0019FASTVT: 8 V
TZAT-0019SLOWVT: 8 V
TZAT-0019SLOWVT: 8 V
TZAT-0019SLOWVT: 8 V
TZAT-0019SLOWVT: 8 V
TZAT-0019SLOWVT: 8 V
TZAT-0019SLOWVT: 8 V
TZAT-0020FASTVT: 1.6 ms
TZAT-0020FASTVT: 1.6 ms
TZAT-0020FASTVT: 1.6 ms
TZAT-0020FASTVT: 1.6 ms
TZAT-0020FASTVT: 1.6 ms
TZAT-0020SLOWVT: 1.6 ms
TZAT-0020SLOWVT: 1.6 ms
TZAT-0020SLOWVT: 1.6 ms
TZAT-0020SLOWVT: 1.6 ms
TZAT-0020SLOWVT: 1.6 ms
TZAT-0020SLOWVT: 1.6 ms
TZON-0003FASTVT: 260 ms
TZON-0003SLOWVT: 400 ms
TZON-0004SLOWVT: 20
TZON-0005SLOWVT: 12
TZON-0008FASTVT: 0 ms
TZON-0008SLOWVT: 0 ms
TZON-0011AFLUTTER: 70
TZST-0001FASTVT: 2
TZST-0003FASTVT: 35 J
VENTRICULAR PACING ICD: 0 pct
VF: 0

## 2011-03-26 ENCOUNTER — Encounter: Payer: Self-pay | Admitting: Family Medicine

## 2011-03-26 ENCOUNTER — Ambulatory Visit (INDEPENDENT_AMBULATORY_CARE_PROVIDER_SITE_OTHER): Payer: Medicare Other | Admitting: Family Medicine

## 2011-03-26 ENCOUNTER — Ambulatory Visit
Admission: RE | Admit: 2011-03-26 | Discharge: 2011-03-26 | Disposition: A | Discharge status: Home / Self Care | Payer: Medicare Other | Source: Ambulatory Visit | Attending: Family Medicine | Admitting: Family Medicine

## 2011-03-26 ENCOUNTER — Telehealth: Payer: Self-pay | Admitting: Family Medicine

## 2011-03-26 VITALS — BP 139/82 | HR 68 | Temp 97.9°F | Ht 62.0 in | Wt 307.6 lb

## 2011-03-26 DIAGNOSIS — M25552 Pain in left hip: Secondary | ICD-10-CM

## 2011-03-26 DIAGNOSIS — M25559 Pain in unspecified hip: Secondary | ICD-10-CM

## 2011-03-26 DIAGNOSIS — I10 Essential (primary) hypertension: Secondary | ICD-10-CM

## 2011-03-26 DIAGNOSIS — R7309 Other abnormal glucose: Secondary | ICD-10-CM

## 2011-03-26 LAB — COMPREHENSIVE METABOLIC PANEL
ALT: 24 U/L (ref 0–53)
AST: 17 U/L (ref 0–37)
Albumin: 4.2 g/dL (ref 3.5–5.2)
Alkaline Phosphatase: 68 U/L (ref 39–117)
BUN: 17 mg/dL (ref 6–23)
CO2: 26 mEq/L (ref 19–32)
Calcium: 9.3 mg/dL (ref 8.4–10.5)
Chloride: 103 mEq/L (ref 96–112)
Creat: 1.2 mg/dL (ref 0.50–1.35)
Glucose, Bld: 112 mg/dL — ABNORMAL HIGH (ref 70–99)
Potassium: 4.6 mEq/L (ref 3.5–5.3)
Sodium: 139 mEq/L (ref 135–145)
Total Bilirubin: 0.4 mg/dL (ref 0.3–1.2)
Total Protein: 7 g/dL (ref 6.0–8.3)

## 2011-03-26 LAB — POCT GLYCOSYLATED HEMOGLOBIN (HGB A1C): Hemoglobin A1C: 7

## 2011-03-26 LAB — LDL CHOLESTEROL, DIRECT: Direct LDL: 119 mg/dL — ABNORMAL HIGH

## 2011-03-26 MED ORDER — HYDROCODONE-ACETAMINOPHEN 5-325 MG PO TABS: 2.0000 | ORAL_TABLET | Freq: Three times a day (TID) | ORAL | Status: AC | PRN

## 2011-03-26 MED ORDER — HYDROCODONE-ACETAMINOPHEN 5-325 MG PO TABS: 2.0000 | ORAL_TABLET | Freq: Three times a day (TID) | ORAL | Status: DC | PRN

## 2011-03-26 NOTE — Progress Notes (Signed)
  Subjective:    Patient ID: [name], male    DOB: 20-Apr-1960, 50 y.o.   MRN: [id number]  HPI Complaining of one week of left hip pain. Was driving to the grocery store when he fell acute onset left hip pain. He was able to do this area and sent return home but since then it has been bothering him so much that he is having difficulty sleeping. It hurts if he moves his hip a certain way. He had no specific injury that he is aware of. He has no numbness in his leg and his leg has not given way. Pain is in the groin area worse with certain motions and can be 8/10 at times. He's had no testicular pain no change in urinary frequency.  Recently saw his cardiologist to change some of his medications.  Followup elevated blood sugar without diagnosis of diabetes. He has not been checking his sugars. He has felt well. He has not had any significant weight loss and he has not been exercising.   Review of Systems Denies unusual shortness of breath with exercise, denies chest pains with exercise.    Objective:   Physical Exam  Vital signs reviewed GENERALl: Well developed, well nourished, in no acute distress. NECK: Supple, FROM, without lymphadenopathy.  THYROID: normal without nodularity CAROTID ARTERIES: without bruits LUNGS: clear to auscultation bilaterally. No wheezes or rales. HEART: Regular rate and rhythm, no murmurs ABDOMEN: soft with positive bowel sounds NEURO: No gross focal deficits MSK Left hip IR is limited by pain, ER is painless and full. Distally he is neurovascularly intact.      Assessment & Plan:  #1. Left hip pain. I suspect this may be osteoarthritis. I will get some x-rays today and let him know. We did discuss weight loss as the primary treatment for that. #2 hypertension and cardiomyopathy essentially followed by his cardiologist. I do not see any lab work so I will go ahead and get some today. #3. Abnormal glucose without frank diagnosis of diabetes mellitus.  We'll check A1c today. Back to see him back in one month

## 2011-03-26 NOTE — Telephone Encounter (Signed)
Called in rx into pharmacy. Called patient and informed of this and also gave message below

## 2011-03-26 NOTE — Telephone Encounter (Signed)
Dear Dema Severin Team Please call and tell him he has some mild arthritis changes in his hip--let's try giving him some pain meds for a week or two--take them only at night if you can get by with it but if it is hurting a lot, take up to three times a day I should see him back in mid January to see if this is working. If not, I could try a hip injection for him --I would have to do that at [facility]). Please call the hydrocodone in as I rx it THANKS! [name]

## 2011-03-27 ENCOUNTER — Encounter: Payer: Self-pay | Admitting: Family Medicine

## 2011-04-30 ENCOUNTER — Ambulatory Visit: Payer: Medicare Other | Admitting: Family Medicine

## 2011-05-08 DIAGNOSIS — F329 Major depressive disorder, single episode, unspecified: Secondary | ICD-10-CM | POA: Diagnosis not present

## 2011-05-08 DIAGNOSIS — F909 Attention-deficit hyperactivity disorder, unspecified type: Secondary | ICD-10-CM | POA: Diagnosis not present

## 2011-05-14 ENCOUNTER — Ambulatory Visit (INDEPENDENT_AMBULATORY_CARE_PROVIDER_SITE_OTHER): Payer: Medicare Other | Admitting: Family Medicine

## 2011-05-14 VITALS — BP 132/81 | HR 71 | Temp 98.2°F | Ht 62.0 in | Wt 317.5 lb

## 2011-05-14 DIAGNOSIS — R7309 Other abnormal glucose: Secondary | ICD-10-CM | POA: Diagnosis not present

## 2011-05-14 DIAGNOSIS — Z0000 Encounter for general adult medical examination without abnormal findings: Secondary | ICD-10-CM | POA: Diagnosis not present

## 2011-05-14 DIAGNOSIS — N183 Chronic kidney disease, stage 3 unspecified: Secondary | ICD-10-CM | POA: Diagnosis not present

## 2011-05-14 DIAGNOSIS — I10 Essential (primary) hypertension: Secondary | ICD-10-CM

## 2011-05-15 NOTE — Progress Notes (Signed)
  Subjective:    Patient ID: [name], male    DOB: [birth year], 51 y.o.   MRN: [id number]  HPI  1.  Follow up hyperglycemia: Taking all medicines regularly and without problems. No episodes of low blood sugar. No blurry vision or increased urination. 2.  HTN F/U: Taking medicines regularly without problems. Denies chest pain, shortness of breath. 3.  F/U  HYPERLIPIDEMIA: Taking medicines regularly and without problem. No muscle aches that are concerning. 4.  Followup hip pain. It totally resolved after about another week of rest. He has not had anymore problems with it.  He is also following up with his cardiologist in has had some medication changes. Has not been doing much exercise recently but does hope to get back to it in the near future. Denies having any lower extremity edema, denies any unusual shortness of breath with exertion, denies chest pain with exertion. Appetite has been the same. Review of Systems    please see history of present illness above for complete pertinent review of systems. Objective:   Physical Exam  Vital signs reviewed. GENERAL: Well-developed, well-nourished, no acute distress. CARDIOVASCULAR: Regular rate and rhythm no murmur gallop or rub LUNGS: Clear to auscultation bilaterally, no rales or wheeze. ABDOMEN: Soft positive bowel sounds NEURO: No gross focal neurological deficits. MSK: Movement of extremity x 4.        Assessment & Plan:  #1. Hyperglycemia:. We'll get labs today. We discussed weight loss and exercise. #2. Hypertension and cardiomyopathy followed both by [place] as well as cardiology. I reviewed the medication changes that cardiology had made. He still gets him on ACE inhibitor and an ARB. He sees them in 2 weeks for a left and follow his creatinine. #3. Hyperlipidemia. His last LDL was suboptimal. He's not sure why the cardiologist changed him from simvastatin to pravastatin. I told him to follow that up with his cardiologist as  well. #4. Preventive medicine. He has finally agreed to have colonoscopy. We will set that up. I'll see him back in 3 months, sooner if problems

## 2011-06-06 DIAGNOSIS — I10 Essential (primary) hypertension: Secondary | ICD-10-CM | POA: Diagnosis not present

## 2011-06-06 DIAGNOSIS — E7800 Pure hypercholesterolemia, unspecified: Secondary | ICD-10-CM | POA: Diagnosis not present

## 2011-06-06 DIAGNOSIS — I428 Other cardiomyopathies: Secondary | ICD-10-CM | POA: Diagnosis not present

## 2011-06-06 DIAGNOSIS — I509 Heart failure, unspecified: Secondary | ICD-10-CM | POA: Diagnosis not present

## 2011-06-06 DIAGNOSIS — E119 Type 2 diabetes mellitus without complications: Secondary | ICD-10-CM | POA: Diagnosis not present

## 2011-06-09 ENCOUNTER — Encounter: Payer: Self-pay | Admitting: Internal Medicine

## 2011-06-13 ENCOUNTER — Other Ambulatory Visit: Payer: Self-pay | Admitting: Cardiology

## 2011-06-13 DIAGNOSIS — H4011X0 Primary open-angle glaucoma, stage unspecified: Secondary | ICD-10-CM | POA: Diagnosis not present

## 2011-06-13 DIAGNOSIS — H409 Unspecified glaucoma: Secondary | ICD-10-CM | POA: Diagnosis not present

## 2011-06-17 ENCOUNTER — Encounter: Payer: Self-pay | Admitting: Internal Medicine

## 2011-06-17 ENCOUNTER — Ambulatory Visit (INDEPENDENT_AMBULATORY_CARE_PROVIDER_SITE_OTHER): Payer: Medicare Other | Admitting: Internal Medicine

## 2011-06-17 VITALS — BP 139/91 | HR 69 | Ht 62.0 in | Wt 319.0 lb

## 2011-06-17 DIAGNOSIS — I5022 Chronic systolic (congestive) heart failure: Secondary | ICD-10-CM | POA: Diagnosis not present

## 2011-06-17 DIAGNOSIS — G4733 Obstructive sleep apnea (adult) (pediatric): Secondary | ICD-10-CM | POA: Diagnosis not present

## 2011-06-17 DIAGNOSIS — Z95810 Presence of automatic (implantable) cardiac defibrillator: Secondary | ICD-10-CM

## 2011-06-17 DIAGNOSIS — I428 Other cardiomyopathies: Secondary | ICD-10-CM

## 2011-06-17 HISTORY — DX: Presence of automatic (implantable) cardiac defibrillator: Z95.810

## 2011-06-17 LAB — ICD DEVICE OBSERVATION
BATTERY VOLTAGE: 3.07 V
BRDY-0002RV: 40 {beats}/min
CHARGE TIME: 8.2 s
DEV-0020ICD: NEGATIVE
FVT: 0
PACEART VT: 7
RV LEAD AMPLITUDE: 6.6 mv
RV LEAD IMPEDENCE ICD: 392 Ohm
RV LEAD THRESHOLD: 1 V
TZAT-0001FASTVT: 1
TZAT-0001FASTVT: 3
TZAT-0001FASTVT: 4
TZAT-0001FASTVT: 5
TZAT-0001FASTVT: 6
TZAT-0001SLOWVT: 1
TZAT-0001SLOWVT: 2
TZAT-0001SLOWVT: 3
TZAT-0001SLOWVT: 4
TZAT-0001SLOWVT: 5
TZAT-0001SLOWVT: 6
TZAT-0002FASTVT: NEGATIVE
TZAT-0002FASTVT: NEGATIVE
TZAT-0002FASTVT: NEGATIVE
TZAT-0002FASTVT: NEGATIVE
TZAT-0002SLOWVT: NEGATIVE
TZAT-0002SLOWVT: NEGATIVE
TZAT-0002SLOWVT: NEGATIVE
TZAT-0002SLOWVT: NEGATIVE
TZAT-0002SLOWVT: NEGATIVE
TZAT-0002SLOWVT: NEGATIVE
TZAT-0004FASTVT: 8
TZAT-0005FASTVT: 88 pct
TZAT-0011FASTVT: 10 ms
TZAT-0012FASTVT: 200 ms
TZAT-0012FASTVT: 200 ms
TZAT-0012FASTVT: 200 ms
TZAT-0012FASTVT: 200 ms
TZAT-0012FASTVT: 200 ms
TZAT-0012SLOWVT: 200 ms
TZAT-0012SLOWVT: 200 ms
TZAT-0012SLOWVT: 200 ms
TZAT-0012SLOWVT: 200 ms
TZAT-0012SLOWVT: 200 ms
TZAT-0012SLOWVT: 200 ms
TZAT-0013FASTVT: 1
TZAT-0018FASTVT: NEGATIVE
TZAT-0018FASTVT: NEGATIVE
TZAT-0018FASTVT: NEGATIVE
TZAT-0018FASTVT: NEGATIVE
TZAT-0018FASTVT: NEGATIVE
TZAT-0018SLOWVT: NEGATIVE
TZAT-0018SLOWVT: NEGATIVE
TZAT-0018SLOWVT: NEGATIVE
TZAT-0018SLOWVT: NEGATIVE
TZAT-0018SLOWVT: NEGATIVE
TZAT-0018SLOWVT: NEGATIVE
TZAT-0019FASTVT: 8 V
TZAT-0019FASTVT: 8 V
TZAT-0019FASTVT: 8 V
TZAT-0019FASTVT: 8 V
TZAT-0019FASTVT: 8 V
TZAT-0019SLOWVT: 8 V
TZAT-0019SLOWVT: 8 V
TZAT-0019SLOWVT: 8 V
TZAT-0019SLOWVT: 8 V
TZAT-0019SLOWVT: 8 V
TZAT-0019SLOWVT: 8 V
TZAT-0020FASTVT: 1.6 ms
TZAT-0020FASTVT: 1.6 ms
TZAT-0020FASTVT: 1.6 ms
TZAT-0020FASTVT: 1.6 ms
TZAT-0020FASTVT: 1.6 ms
TZAT-0020SLOWVT: 1.6 ms
TZAT-0020SLOWVT: 1.6 ms
TZAT-0020SLOWVT: 1.6 ms
TZAT-0020SLOWVT: 1.6 ms
TZAT-0020SLOWVT: 1.6 ms
TZAT-0020SLOWVT: 1.6 ms
TZON-0003FASTVT: 260 ms
TZON-0003SLOWVT: 400 ms
TZON-0004SLOWVT: 20
TZON-0005SLOWVT: 12
TZON-0008FASTVT: 0 ms
TZON-0008SLOWVT: 0 ms
TZON-0011AFLUTTER: 70
TZST-0001FASTVT: 2
TZST-0003FASTVT: 35 J
VENTRICULAR PACING ICD: 0 pct
VF: 0

## 2011-06-17 NOTE — Progress Notes (Signed)
  HPI  [name] is a 51 y.o. male seen in followup for an ICD implanted in the setting of nonischemic heart disease and congestive heart failure.  He is doing pretty well. He had shortness of breath and some peripheral edema.  We reviewed his dietary salt intake; he eats frequent sandwiches with cold cuts and prepared foods. He continues to struggle with weight. His shortness of breath and daytime somnolence and occasional peripheral edema he denies chest pain Past Medical History  Diagnosis Date  . Severe hypertension   . Chest pain   . Cardiomyopathy     with borderline ejection fraction in this 51 year old male    Past Surgical History  Procedure Date  . Cardiac catheterization 10/24/2003    EF of 45-50%    Current Outpatient Prescriptions  Medication Sig Dispense Refill  . amLODipine (NORVASC) 10 MG tablet Take 10 mg by mouth daily.        Marland Kitchen aspirin 325 MG tablet Take 325 mg by mouth daily.        . brimonidine (ALPHAGAN) 0.2 % ophthalmic solution Place 1 drop into both eyes every 8 (eight) hours.       . carvedilol (COREG) 25 MG tablet Take 25 mg by mouth 2 (two) times daily with a meal.      . dorzolamide (TRUSOPT) 2 % ophthalmic solution       . isosorbide mononitrate (ISMO,MONOKET) 20 MG tablet Take 40 mg by mouth daily.        Marland Kitchen latanoprost (XALATAN) 0.005 % ophthalmic solution Place 1 drop into both eyes at bedtime.       Marland Kitchen losartan (COZAAR) 100 MG tablet Take 100 mg by mouth daily.        . metFORMIN (GLUCOPHAGE) 500 MG tablet Take 500 mg by mouth 2 (two) times daily with a meal.        . pravastatin (PRAVACHOL) 80 MG tablet Take 80 mg by mouth daily.        Marland Kitchen spironolactone (ALDACTONE) 25 MG tablet Take 25 mg by mouth daily. Per Dr. Glennon Mac       . timolol (TIMOPTIC) 0.5 % ophthalmic solution Place 1 drop into both eyes 2 (two) times daily.         No Known Allergies  Review of Systems negative except from HPI and PMH  Physical Exam BP 139/91  Pulse 69   Ht 5\' 2"  (1.575 m)  Wt 319 lb (144.697 kg)  BMI 58.35 kg/m2 Well developed and morbidly obese male in no acute distress  HENT normal poor dentition  E scleral and icterus clear Neck Supple JVP unable to visualize  Clear to ausculation Regular rate and rhythm, no murmurs gallops or rub Soft with active bowel sounds No clubbing cyanosis Trace Edema Alert and oriented, grossly normal motor and sensory function Skin Warm and Dry   Assessment and  Plan

## 2011-06-17 NOTE — Assessment & Plan Note (Signed)
The patient's device was interrogated.  The information was reviewed. No changes were made in the programming.    

## 2011-06-17 NOTE — Assessment & Plan Note (Addendum)
We discussed diet and the importance of carbohydrate limitation and exercise. They're going to join Y.

## 2011-06-17 NOTE — Patient Instructions (Addendum)
Your physician recommends that you schedule a follow-up appointment in: 3 months with Kristin/Paula for a device check.  Your physician wants you to follow-up in: 1 year with Dr. Caryl Comes. You will receive a reminder letter in the mail two months in advance. If you don't receive a letter, please call our office to schedule the follow-up appointment.  Your physician recommends that you continue on your current medications as directed. Please refer to the Current Medication list given to you today.

## 2011-06-17 NOTE — Assessment & Plan Note (Signed)
Continue current medication.

## 2011-06-17 NOTE — Assessment & Plan Note (Signed)
Not following througn  Have encouraged him to followup with PCP

## 2011-06-27 DIAGNOSIS — H4011X0 Primary open-angle glaucoma, stage unspecified: Secondary | ICD-10-CM | POA: Diagnosis not present

## 2011-06-27 DIAGNOSIS — H2510 Age-related nuclear cataract, unspecified eye: Secondary | ICD-10-CM | POA: Diagnosis not present

## 2011-06-27 DIAGNOSIS — H11159 Pinguecula, unspecified eye: Secondary | ICD-10-CM | POA: Diagnosis not present

## 2011-06-27 DIAGNOSIS — Z961 Presence of intraocular lens: Secondary | ICD-10-CM | POA: Diagnosis not present

## 2011-07-09 ENCOUNTER — Ambulatory Visit (AMBULATORY_SURGERY_CENTER): Payer: Medicare Other | Admitting: *Deleted

## 2011-07-09 VITALS — Ht 62.0 in | Wt 319.5 lb

## 2011-07-09 DIAGNOSIS — Z1211 Encounter for screening for malignant neoplasm of colon: Secondary | ICD-10-CM

## 2011-07-09 MED ORDER — PEG-KCL-NACL-NASULF-NA ASC-C 100 G PO SOLR: 1.0000 | Freq: Once | ORAL | Status: DC

## 2011-07-23 ENCOUNTER — Encounter: Payer: Self-pay | Admitting: Internal Medicine

## 2011-07-23 ENCOUNTER — Ambulatory Visit (AMBULATORY_SURGERY_CENTER): Payer: Medicare Other | Admitting: Internal Medicine

## 2011-07-23 VITALS — BP 132/80 | HR 54 | Temp 96.4°F | Resp 18 | Ht 62.0 in | Wt 319.0 lb

## 2011-07-23 DIAGNOSIS — Z800 Family history of malignant neoplasm of digestive organs: Secondary | ICD-10-CM | POA: Diagnosis not present

## 2011-07-23 DIAGNOSIS — Z1211 Encounter for screening for malignant neoplasm of colon: Secondary | ICD-10-CM | POA: Diagnosis not present

## 2011-07-23 DIAGNOSIS — G4733 Obstructive sleep apnea (adult) (pediatric): Secondary | ICD-10-CM | POA: Diagnosis not present

## 2011-07-23 LAB — GLUCOSE, CAPILLARY
Glucose-Capillary: 117 mg/dL — ABNORMAL HIGH (ref 70–99)
Glucose-Capillary: 114 mg/dL — ABNORMAL HIGH (ref 70–99)

## 2011-07-23 MED ORDER — SODIUM CHLORIDE 0.9 % IV SOLN: 500.0000 mL | INTRAVENOUS | Status: DC

## 2011-07-23 NOTE — Progress Notes (Signed)
PT HAS HIS 53 YEAR OLD SON WITH HIM AS A CARE PARTNER. PT INSTRUCTED HE WILL NEED TO CALL SOMEONE OVER THE AGE OF 18 TO COME TO TAKE HIM HOME AND RECEIVE DISCHARGE INSTRUCTIONS. PT VERBALIZED HE IS AWAKE AND CAN SIGN HIS OWN PAPERS AND HE DOESN'T NEED ANYONE ELSE TO DO SO. PT INFORMED HE CANNOT SIGN HIS PAPERS AS HE HAS BEEN SEDATED AND HIS SON IS ONLY 14 AND IS NOT OF LEGAL AGE TO SIGN AND TAKE RESPONSIBILITY FOR THE PT. PT VERY VERBAL THAT HE IS FINE AND CAN SIGN HIS OWN PAPERS. THAT THE PERSON COMING TO GET HIM DOESN'T LIVE WITH HIM AND HE NEEDS HIS OWN INSTRUCTIONS. AGAIN INSTRUCTED PT DUE TO SEDATION, LEGALLY HE CANNOT SIGN ANYTHING FOR ME. TOLD EVERYTHING WILL BE WRITTEN DOWN FOR HIM TO READ  LATER AND WILL NOT DISCUSS ANY DETAILS OF THE PROCEDURE WITH THE PERSON PICKING HIM UP SINCE HE ISN'T FAMILY.  PT STATES HE WAS TOLD AT HIS PRE VISIT THAT HE NEEDED SOMEONE OVER 18 WITH HIM, BUT HE DIDN'T HAVE ANY ONE TO COME WITH HIM OVER 18 SO THAT'S WHY HIS SON IS HERE. TOLD AGAIN THE SON IS ONLY 14 AND LEGALLY CANNOT SIGN ANY DISCHARGE PAPERS. EWM

## 2011-07-23 NOTE — Patient Instructions (Signed)
YOU HAD AN ENDOSCOPIC PROCEDURE TODAY AT THE [facility]: Refer to the procedure report that was given to you for any specific questions about what was found during the examination.  If the procedure report does not answer your questions, please call your gastroenterologist to clarify.  If you requested that your care partner not be given the details of your procedure findings, then the procedure report has been included in a sealed envelope for you to review at your convenience later.  YOU SHOULD EXPECT: Some feelings of bloating in the abdomen. Passage of more gas than usual.  Walking can help get rid of the air that was put into your GI tract during the procedure and reduce the bloating. If you had a lower endoscopy (such as a colonoscopy or flexible sigmoidoscopy) you may notice spotting of blood in your stool or on the toilet paper. If you underwent a bowel prep for your procedure, then you may not have a normal bowel movement for a few days.  DIET: Your first meal following the procedure should be a light meal and then it is ok to progress to your normal diet.  A half-sandwich or bowl of soup is an example of a good first meal.  Heavy or fried foods are harder to digest and may make you feel nauseous or bloated.  Likewise meals heavy in dairy and vegetables can cause extra gas to form and this can also increase the bloating.  Drink plenty of fluids but you should avoid alcoholic beverages for 24 hours.  ACTIVITY: Your care partner should take you home directly after the procedure.  You should plan to take it easy, moving slowly for the rest of the day.  You can resume normal activity the day after the procedure however you should NOT DRIVE or use heavy machinery for 24 hours (because of the sedation medicines used during the test).    SYMPTOMS TO REPORT IMMEDIATELY: A gastroenterologist can be reached at any hour.  During normal business hours, 8:30 AM to 5:00 PM Monday through Friday,  call [phone].  After hours and on weekends, please call the GI answering service at [phone] who will take a message and have the physician on call contact you.   Following lower endoscopy (colonoscopy or flexible sigmoidoscopy):  Excessive amounts of blood in the stool  Significant tenderness or worsening of abdominal pains  Swelling of the abdomen that is new, acute  Fever of 100F or higher  Following upper endoscopy (EGD)  Vomiting of blood or coffee ground material  New chest pain or pain under the shoulder blades  Painful or persistently difficult swallowing  New shortness of breath  Fever of 100F or higher  Black, tarry-looking stools  FOLLOW UP: If any biopsies were taken you will be contacted by phone or by letter within the next 1-3 weeks.  Call your gastroenterologist if you have not heard about the biopsies in 3 weeks.  Our staff will call the home number listed on your records the next business day following your procedure to check on you and address any questions or concerns that you may have at that time regarding the information given to you following your procedure. This is a courtesy call and so if there is no answer at the home number and we have not heard from you through the emergency physician on call, we will assume that you have returned to your regular daily activities without incident.  SIGNATURES/CONFIDENTIALITY: You and/or your care   partner have signed paperwork which will be entered into your electronic medical record.  These signatures attest to the fact that that the information above on your After Visit Summary has been reviewed and is understood.  Full responsibility of the confidentiality of this discharge information lies with you and/or your care-partner.   HANDOUT ON DIVERTICULOSIS AND HIGH FIBER DIET REPEAT COLONOSCOPY IN 5 YEARS

## 2011-07-23 NOTE — Progress Notes (Signed)
Patient did not experience any of the following events: a burn prior to discharge; a fall within the facility; wrong site/side/patient/procedure/implant event; or a hospital transfer or hospital admission upon discharge from the facility. (G8907) Patient did not have preoperative order for IV antibiotic SSI prophylaxis. (G8918)  

## 2011-07-23 NOTE — Op Note (Signed)
[address]. [place], [place]  [id number]  COLONOSCOPY PROCEDURE REPORT  PATIENT:  [name], [name]  MR#:  [id number] BIRTHDATE:  [birth year], 89 yrs. old  GENDER:  male ENDOSCOPIST:  Lajuan Lines. Taya Ashbaugh, MD REF. BY:  [name], M.D. PROCEDURE DATE:  07/23/2011 PROCEDURE:  Colonoscopy [id number] ASA CLASS:  Class III INDICATIONS:  Elevated Risk Screening, family history of colon cancer (father) MEDICATIONS:   MAC sedation, administered by CRNA, propofol (Diprivan) 100 mg IV  DESCRIPTION OF PROCEDURE:   After the risks benefits and alternatives of the procedure were thoroughly explained, informed consent was obtained.  Digital rectal exam was performed and revealed no rectal masses.   The LB 180AL [id number] endoscope was introduced through the anus and advanced to the cecum, which was identified by both the appendix and ileocecal valve, without limitations.  The quality of the prep was excellent, using MoviPrep.  The instrument was then slowly withdrawn as the colon was fully examined. <<PROCEDUREIMAGES>>  FINDINGS:  Scattered diverticula were found ascending colon to sigmoid colon.  Otherwise normal colonoscopy without other polyps, masses, vascular ectasias, or inflammatory changes.   Retroflexed views in the rectum revealed no abnormalities.   The scope was then withdrawn from the cecum and the procedure completed.  COMPLICATIONS:  None  ENDOSCOPIC IMPRESSION: 1) Diverticula, scattered ascending colon to sigmoid colon 2) Otherwise normal colonoscopy  RECOMMENDATIONS: 1) High fiber diet. 2) Given your significant family history of colon cancer, you should have a repeat colonoscopy in 5 years  Lajuan Lines. Hilarie Fredrickson, MD  CC:  The Patient [name], MD  n. Lorrin MaisLajuan Lines. Oleta Gunnoe at 07/23/2011 11:15 AM  Harriett Sine, [id number]

## 2011-07-24 ENCOUNTER — Telehealth: Payer: Self-pay | Admitting: *Deleted

## 2011-07-24 NOTE — Telephone Encounter (Signed)
Pt. Left no return phone number and asked that no messages be left.

## 2011-08-06 NOTE — Telephone Encounter (Signed)
No number given for follow-up. Pt. Wanted no messages left.

## 2011-08-20 ENCOUNTER — Encounter: Payer: Self-pay | Admitting: Family Medicine

## 2011-08-20 ENCOUNTER — Ambulatory Visit (INDEPENDENT_AMBULATORY_CARE_PROVIDER_SITE_OTHER): Payer: Medicare Other | Admitting: Family Medicine

## 2011-08-20 VITALS — BP 158/94 | HR 64 | Temp 98.3°F | Ht 62.0 in | Wt 316.3 lb

## 2011-08-20 DIAGNOSIS — I428 Other cardiomyopathies: Secondary | ICD-10-CM | POA: Diagnosis not present

## 2011-08-20 DIAGNOSIS — R7309 Other abnormal glucose: Secondary | ICD-10-CM | POA: Diagnosis not present

## 2011-08-20 DIAGNOSIS — E785 Hyperlipidemia, unspecified: Secondary | ICD-10-CM | POA: Diagnosis not present

## 2011-08-20 DIAGNOSIS — I10 Essential (primary) hypertension: Secondary | ICD-10-CM | POA: Diagnosis not present

## 2011-08-20 HISTORY — DX: Hyperlipidemia, unspecified: E78.5

## 2011-08-20 LAB — COMPREHENSIVE METABOLIC PANEL
ALT: 27 U/L (ref 0–53)
AST: 21 U/L (ref 0–37)
Albumin: 4.3 g/dL (ref 3.5–5.2)
Alkaline Phosphatase: 66 U/L (ref 39–117)
BUN: 10 mg/dL (ref 6–23)
CO2: 26 mEq/L (ref 19–32)
Calcium: 8.6 mg/dL (ref 8.4–10.5)
Chloride: 109 mEq/L (ref 96–112)
Creat: 1.14 mg/dL (ref 0.50–1.35)
Glucose, Bld: 139 mg/dL — ABNORMAL HIGH (ref 70–99)
Potassium: 4.3 mEq/L (ref 3.5–5.3)
Sodium: 143 mEq/L (ref 135–145)
Total Bilirubin: 0.5 mg/dL (ref 0.3–1.2)
Total Protein: 6.6 g/dL (ref 6.0–8.3)

## 2011-08-20 LAB — LIPID PANEL
Cholesterol: 150 mg/dL (ref 0–200)
HDL: 33 mg/dL — ABNORMAL LOW (ref >39)
LDL Cholesterol: 91 mg/dL (ref 0–99)
Total CHOL/HDL Ratio: 4.5 Ratio
Triglycerides: 129 mg/dL (ref <150)
VLDL: 26 mg/dL (ref 0–40)

## 2011-08-20 NOTE — Progress Notes (Signed)
  Subjective:    Patient ID: [name], male    DOB: 06/22/1960, 51 y.o.   MRN: [id number]  HPI #1. Followup hypertension with known history of cardiomyopathy. He is also followed by Dr. Lyla Son. He has had no chest pains. He has some checked shortness of breath with exertion but this is unchanged from his baseline. He has been taking all his medications are really not taken this morning prior to his appointment because she's not had breakfast yet. #2. Followup left shoulder pain. He had some subacromial decompression and that has improved his symptoms quite a bit. He still has pain if he tries to reach behind him with the left arm. #3. Hyperlipidemia. His cardiologist changed him to pravochol   Review of Systems Pertinent review of systems: negative for fever or unusual weight change.    Objective:   Physical Exam Vital signs reviewed. GENERAL: Well-developed, well-nourished, no acute distress. CARDIOVASCULAR: Regular rate and rhythm no murmur gallop or rub LUNGS: Clear to auscultation bilaterally, no rales or wheeze. ABDOMEN: Soft positive bowel sounds NEURO: No gross focal neurological deficits. MSK: Movement of extremity x 4.         Assessment & Plan:  #1. Hypertension cardiovascular disease. His blood pressure somewhat elevated today looking back in his chart he's been previously well-controlled. I don't notice because his not taking his medicine this morning or for other reasons. I'll have him take some blood pressure readings and followup in one month. We'll make no medication changes today.

## 2011-08-22 ENCOUNTER — Encounter: Payer: Self-pay | Admitting: Family Medicine

## 2011-08-25 ENCOUNTER — Other Ambulatory Visit: Payer: Self-pay | Admitting: Family Medicine

## 2011-08-25 DIAGNOSIS — I10 Essential (primary) hypertension: Secondary | ICD-10-CM

## 2011-08-26 ENCOUNTER — Telehealth: Payer: Self-pay | Admitting: Family Medicine

## 2011-08-26 NOTE — Telephone Encounter (Signed)
Patient is out of isosorbide mononitrate and needs a refill sent to [facility] on [address].  Please call when it is ready and a 90 day supply.

## 2011-08-26 NOTE — Telephone Encounter (Signed)
Called and informed pt that rx has been sent in to pharmacy and that he would need to contact them to see if it is ready.[name] [place]

## 2011-08-28 DIAGNOSIS — H409 Unspecified glaucoma: Secondary | ICD-10-CM | POA: Diagnosis not present

## 2011-08-28 DIAGNOSIS — H11159 Pinguecula, unspecified eye: Secondary | ICD-10-CM | POA: Diagnosis not present

## 2011-08-28 DIAGNOSIS — H1045 Other chronic allergic conjunctivitis: Secondary | ICD-10-CM | POA: Diagnosis not present

## 2011-08-28 DIAGNOSIS — H4011X0 Primary open-angle glaucoma, stage unspecified: Secondary | ICD-10-CM | POA: Diagnosis not present

## 2011-08-31 ENCOUNTER — Emergency Department (HOSPITAL_COMMUNITY): Payer: Medicare Other

## 2011-08-31 ENCOUNTER — Encounter (HOSPITAL_COMMUNITY): Payer: Self-pay | Admitting: *Deleted

## 2011-08-31 ENCOUNTER — Emergency Department (HOSPITAL_COMMUNITY)
Admission: EM | Admit: 2011-08-31 | Discharge: 2011-08-31 | Disposition: A | Discharge status: Home / Self Care | Payer: Medicare Other | Attending: Emergency Medicine | Admitting: Emergency Medicine

## 2011-08-31 DIAGNOSIS — M25569 Pain in unspecified knee: Secondary | ICD-10-CM | POA: Diagnosis not present

## 2011-08-31 DIAGNOSIS — E785 Hyperlipidemia, unspecified: Secondary | ICD-10-CM | POA: Insufficient documentation

## 2011-08-31 DIAGNOSIS — IMO0002 Reserved for concepts with insufficient information to code with codable children: Secondary | ICD-10-CM | POA: Diagnosis not present

## 2011-08-31 DIAGNOSIS — Z8673 Personal history of transient ischemic attack (TIA), and cerebral infarction without residual deficits: Secondary | ICD-10-CM | POA: Insufficient documentation

## 2011-08-31 DIAGNOSIS — I10 Essential (primary) hypertension: Secondary | ICD-10-CM | POA: Diagnosis not present

## 2011-08-31 DIAGNOSIS — M7989 Other specified soft tissue disorders: Secondary | ICD-10-CM | POA: Insufficient documentation

## 2011-08-31 DIAGNOSIS — M1710 Unilateral primary osteoarthritis, unspecified knee: Secondary | ICD-10-CM | POA: Diagnosis not present

## 2011-08-31 DIAGNOSIS — H409 Unspecified glaucoma: Secondary | ICD-10-CM | POA: Diagnosis not present

## 2011-08-31 DIAGNOSIS — E119 Type 2 diabetes mellitus without complications: Secondary | ICD-10-CM | POA: Insufficient documentation

## 2011-08-31 DIAGNOSIS — M25469 Effusion, unspecified knee: Secondary | ICD-10-CM | POA: Insufficient documentation

## 2011-08-31 MED ORDER — OXYCODONE-ACETAMINOPHEN 5-325 MG PO TABS: 1.0000 | ORAL_TABLET | ORAL | Status: AC | PRN

## 2011-08-31 MED ORDER — IBUPROFEN 800 MG PO TABS
800.0000 mg | ORAL_TABLET | Freq: Once | ORAL | Status: AC
  Administered 2011-08-31: 800 mg via ORAL
  Filled 2011-08-31: qty 1

## 2011-08-31 MED ORDER — IBUPROFEN 800 MG PO TABS: 800.0000 mg | ORAL_TABLET | Freq: Three times a day (TID) | ORAL | Status: AC

## 2011-08-31 NOTE — ED Provider Notes (Signed)
History     CSN: [id number]  Arrival date & time 08/31/11  [id number]   First MD Initiated Contact with Patient 08/31/11 [id number]      Chief Complaint  Patient presents with  . Knee Pain    (Consider location/radiation/quality/duration/timing/severity/associated sxs/prior treatment) Patient is a 51 y.o. male presenting with knee pain. The history is provided by the patient, a relative and a friend. No language interpreter was used.  Knee Pain This is a chronic problem. The current episode started more than 1 month ago. The problem occurs constantly. The problem has been gradually worsening. Associated symptoms include arthralgias and joint swelling. Pertinent negatives include no fever, nausea, numbness, vomiting or weakness. The symptoms are aggravated by bending and walking. He has tried nothing for the symptoms.   51 year old male complaining of chronic right knee pain. States that the pain is waking him up at night. Has taken nothing for pain. States that 2 months ago he had the same pain but it got better. Today the pain is at 8 out of 10 with reported swelling over the last 2 weeks. Walks with a limp. Past medical history of hypertension chest pain diabetes.  Knee is cool to touch no erythema noted.   Past Medical History  Diagnosis Date  . Severe hypertension   . Chest pain   . Cardiomyopathy     with borderline ejection fraction in this 51 year old male  . Arthritis   . Cataracts, bilateral     right  . Congestive heart failure   . Diabetes mellitus     does not check blood sugars at home  . Glaucoma     bilateral  . Hyperlipidemia   . Hypertension   . H/O TIA (transient ischemic attack) and stroke     with heart issues    Past Surgical History  Procedure Date  . Cardiac catheterization 10/24/2003    EF of 45-50%  . Cardiac defibrillator placement 2008  . Cataract extraction 2012    right  . Shoulder surgery 2012    left    Family History  Problem Relation Age of  Onset  . Colon cancer Father   . Esophageal cancer Neg Hx   . Rectal cancer Neg Hx   . Stomach cancer Neg Hx     History  Substance Use Topics  . Smoking status: Never Smoker   . Smokeless tobacco: Never Used  . Alcohol Use: No      Review of Systems  Constitutional: Negative.  Negative for fever.  HENT: Negative.   Eyes: Negative.   Respiratory: Negative.  Negative for shortness of breath.   Cardiovascular: Negative.   Gastrointestinal: Negative.  Negative for nausea and vomiting.  Musculoskeletal: Positive for joint swelling and arthralgias.       R knee pain  Neurological: Negative.  Negative for weakness and numbness.  Psychiatric/Behavioral: Negative.   All other systems reviewed and are negative.    Allergies  Review of patient's allergies indicates no known allergies.  Home Medications   Current Outpatient Rx  Name Route Sig Dispense Refill  . AMLODIPINE BESYLATE 10 MG PO TABS Oral Take 10 mg by mouth daily.      . ASPIRIN 325 MG PO TABS Oral Take 325 mg by mouth daily.      Marland Kitchen BRIMONIDINE TARTRATE 0.2 % OP SOLN Both Eyes Place 1 drop into both eyes every 8 (eight) hours.     . DORZOLAMIDE HCL 2 % OP SOLN      .  ISOSORBIDE MONONITRATE 20 MG PO TABS Oral Take 20 mg by mouth 2 (two) times daily.    Marland Kitchen LATANOPROST 0.005 % OP SOLN Both Eyes Place 1 drop into both eyes at bedtime.     Marland Kitchen LOSARTAN POTASSIUM 100 MG PO TABS Oral Take 100 mg by mouth daily.      Marland Kitchen METFORMIN HCL 500 MG PO TABS Oral Take 500 mg by mouth 2 (two) times daily with a meal.      . METOPROLOL SUCCINATE ER 25 MG PO TB24 Oral Take 25 mg by mouth 2 (two) times daily.    Marland Kitchen PRAVASTATIN SODIUM 80 MG PO TABS Oral Take 80 mg by mouth daily.      Marland Kitchen SPIRONOLACTONE 25 MG PO TABS Oral Take 25 mg by mouth daily. Per Dr. Glennon Mac     . TIMOLOL MALEATE 0.5 % OP SOLN Both Eyes Place 1 drop into both eyes 2 (two) times daily.     Marland Kitchen PEG-KCL-NACL-NASULF-NA ASC-C 100 G PO SOLR Oral Take 1 kit (100 g total) by mouth  once. 1 kit 0    moviprep as directed    BP 118/73  Pulse 63  Temp(Src) 97.9 F (36.6 C) (Oral)  Resp 18  SpO2 94%  Physical Exam  Nursing note and vitals reviewed. Constitutional: He is oriented to person, place, and time. He appears well-developed and well-nourished.  HENT:  Head: Normocephalic.  Eyes: Conjunctivae and EOM are normal. Pupils are equal, round, and reactive to light.  Neck: Normal range of motion. Neck supple.  Cardiovascular: Normal rate.   Pulmonary/Chest: Effort normal and breath sounds normal. No respiratory distress.  Abdominal: Soft.  Musculoskeletal: He exhibits edema and tenderness.       R knee  Neurological: He is alert and oriented to person, place, and time.  Skin: Skin is warm and dry.  Psychiatric: He has a normal mood and affect.    ED Course  Procedures (including critical care time)  Labs Reviewed - No data to display Dg Knee Complete 4 Views Right  08/31/2011  *RADIOLOGY REPORT*  Clinical Data: Knee pain/swelling, injury 1 month ago  RIGHT KNEE - COMPLETE 4+ VIEW  Comparison: None.  Findings: No fracture or dislocation is seen.  Mild tricompartmental degenerative changes.  Small suprapatellar knee joint effusion.  Mild prepatellar soft tissue swelling.  IMPRESSION: No fracture or dislocation is seen.  Mild degenerative changes.  Small suprapatellar knee joint effusion.  Mild prepatellar soft tissue swelling.  Original Report Authenticated By: Julian Hy, M.D.     No diagnosis found.    MDM  Chronic intermittent right knee pain x2-3 months. X-ray today shows small effusion. Ace bandage was pain medication prescription. Will followup with or so if not better.        Julieta Bellini, NP 08/31/11 1517

## 2011-08-31 NOTE — Discharge Instructions (Signed)
[name] keep your knee elevated and ice times next 24 hours. Take ibuprofen 800 mg every 6 hours x24 with food. Take Percocet for severe pain but do not drive with this medication. Followup with Dr. Tamera Punt. His number is listed below he will be able to drain the effusion if it is worse. Where the ace bandage as needed for comfort.  Knee Effusion  Knee effusion means you have fluid in your knee. The knee may be more difficult to bend and move. HOME CARE  Use crutches or a brace as told by your doctor.   Put ice on the injured area.   Put ice in a plastic bag.   Place a towel between your skin and the bag.   Leave the ice on for 15 to 20 minutes, 3 to 4 times a day.   Raise (elevate) your knee as much as possible.   Only take medicine as told by your doctor.   You may need to do strengthening exercises. Ask your doctor.   Continue with your normal diet and activities as told by your doctor.  GET HELP RIGHT AWAY IF:  You have more puffiness (swelling) in your knee.   You see redness, puffiness, or have more pain in your knee.   You have a temperature by mouth above 102 F (38.9 C).   You get a rash.   You have trouble breathing.   You have a reaction to any medicine you are taking.   You have a lot of pain when you move your knee.  MAKE SURE YOU:  Understand these instructions.   Will watch your condition.   Will get help right away if you are not doing well or get worse.  Document Released: 04/26/2010 Document Revised: 03/13/2011 Document Reviewed: 04/26/2010 [facility] Patient Information 2012 [place].

## 2011-08-31 NOTE — ED Notes (Signed)
Pt reports right knee pain and swelling x over one month.

## 2011-08-31 NOTE — Progress Notes (Signed)
Orthopedic Tech Progress Note Patient Details:  ASAN STLOUIS 1961-03-05 [id number]  Ortho Devices Type of Ortho Device: Velcro wrist splint Ortho Device/Splint Location: left hand Ortho Device/Splint Interventions: Application   Monti Jilek T 08/31/2011, 10:15 AM

## 2011-08-31 NOTE — ED Notes (Signed)
Patient transported to X-ray 

## 2011-09-04 NOTE — ED Provider Notes (Signed)
Medical screening examination/treatment/procedure(s) were performed by non-physician practitioner and as supervising physician I was immediately available for consultation/collaboration.   Mirna Mires, MD 09/04/11 1005

## 2011-09-11 DIAGNOSIS — H409 Unspecified glaucoma: Secondary | ICD-10-CM | POA: Diagnosis not present

## 2011-09-11 DIAGNOSIS — H2510 Age-related nuclear cataract, unspecified eye: Secondary | ICD-10-CM | POA: Diagnosis not present

## 2011-09-11 DIAGNOSIS — Z961 Presence of intraocular lens: Secondary | ICD-10-CM | POA: Diagnosis not present

## 2011-09-11 DIAGNOSIS — H4011X0 Primary open-angle glaucoma, stage unspecified: Secondary | ICD-10-CM | POA: Diagnosis not present

## 2011-09-12 ENCOUNTER — Encounter: Payer: Self-pay | Admitting: Internal Medicine

## 2011-09-12 ENCOUNTER — Telehealth: Payer: Self-pay | Admitting: Internal Medicine

## 2011-09-12 NOTE — Telephone Encounter (Signed)
09-12-11 lmm @ 348pm for pt to rs appt with device 09-24-11, clinic closed that day, unable to reach pt by phone, all numbers incorrect

## 2011-10-01 ENCOUNTER — Ambulatory Visit (INDEPENDENT_AMBULATORY_CARE_PROVIDER_SITE_OTHER): Payer: Medicare Other | Admitting: Family Medicine

## 2011-10-01 ENCOUNTER — Encounter: Payer: Self-pay | Admitting: Family Medicine

## 2011-10-01 ENCOUNTER — Telehealth: Payer: Self-pay | Admitting: *Deleted

## 2011-10-01 VITALS — BP 118/73 | HR 52 | Ht 62.0 in | Wt 308.0 lb

## 2011-10-01 DIAGNOSIS — R7309 Other abnormal glucose: Secondary | ICD-10-CM

## 2011-10-01 DIAGNOSIS — I428 Other cardiomyopathies: Secondary | ICD-10-CM | POA: Diagnosis not present

## 2011-10-01 DIAGNOSIS — M1710 Unilateral primary osteoarthritis, unspecified knee: Secondary | ICD-10-CM

## 2011-10-01 DIAGNOSIS — IMO0002 Reserved for concepts with insufficient information to code with codable children: Secondary | ICD-10-CM

## 2011-10-01 LAB — POCT GLYCOSYLATED HEMOGLOBIN (HGB A1C): Hemoglobin A1C: 6.9

## 2011-10-01 MED ORDER — TRAMADOL HCL 50 MG PO TABS: ORAL_TABLET | ORAL | Status: AC

## 2011-10-01 NOTE — Telephone Encounter (Signed)
Message copied by Teddy Spike on Wed Oct 01, 2011  4:49 PM ------      Message from: [name] L      Created: Wed Oct 01, 2011  4:07 PM       Can u call in tramadol for Mr [name] have not finished his chart yet but the order is in      [place]!      Marland Kitchenme

## 2011-10-01 NOTE — Telephone Encounter (Signed)
Called rx in  

## 2011-10-03 ENCOUNTER — Encounter: Payer: Self-pay | Admitting: *Deleted

## 2011-10-03 ENCOUNTER — Encounter: Payer: Self-pay | Admitting: Family Medicine

## 2011-10-03 DIAGNOSIS — M1710 Unilateral primary osteoarthritis, unspecified knee: Secondary | ICD-10-CM

## 2011-10-03 DIAGNOSIS — M179 Osteoarthritis of knee, unspecified: Secondary | ICD-10-CM | POA: Insufficient documentation

## 2011-10-03 HISTORY — DX: Osteoarthritis of knee, unspecified: M17.9

## 2011-10-03 HISTORY — DX: Unilateral primary osteoarthritis, unspecified knee: M17.10

## 2011-10-03 MED ORDER — ISOSORBIDE MONONITRATE 20 MG PO TABS: 20.0000 mg | ORAL_TABLET | Freq: Two times a day (BID) | ORAL | Status: DC

## 2011-10-03 NOTE — Assessment & Plan Note (Signed)
DHS well the metformin. A1c today is excellent. Congratulated him on good control. A1c is 6.9. I will keep his metformin men at the current dose. Return to clinic 3 months.

## 2011-10-03 NOTE — Assessment & Plan Note (Signed)
No current issues. I urged her to get back to exercise.

## 2011-10-03 NOTE — Progress Notes (Signed)
  Subjective:    Patient ID: [name], male    DOB: [birth year], 51 y.o.   MRN: [id number]  HPI #1. Having some right knee pain. Was seen at the emergency room they did some x-rays. They placed him on ibuprofen which help some. #2. Hypertension and ischemic cardiomyopathy: Taking medications as prescribed. Not doing any exercise right now but not had any chest pain. Denies lower stream any edema. Denies unusual shortness of breath. #3. Diabetes mellitus. His blood sugars do seem to be in pretty good control other he's not been checking them regularly. He's had no episodes of low blood sugar. He is trying to follow a good diet. He is not currently exercising.   Review of Systems Denies unusual weight change, fever, sweats, chills.    Objective:   Physical Exam   Vital signs reviewed. GENERAL: Well-developed, well-nourished, no acute distress. CARDIOVASCULAR: Regular rate and rhythm no murmur gallop or rub LUNGS: Clear to auscultation bilaterally, no rales or wheeze. ABDOMEN: Soft positive bowel sounds NEURO: No gross focal neurological deficits. MSK: Movement of extremity x 4. Right knee has no effusion. Tender to palpation across the medial joint line but not the lateral. Full range of motion in flexion extension. There is some stiffness associated with extension.       Assessment & Plan:

## 2011-10-30 ENCOUNTER — Encounter: Payer: Self-pay | Admitting: Internal Medicine

## 2011-11-05 ENCOUNTER — Ambulatory Visit (INDEPENDENT_AMBULATORY_CARE_PROVIDER_SITE_OTHER): Payer: Medicare Other | Admitting: Family Medicine

## 2011-11-05 VITALS — BP 128/62 | HR 78 | Temp 98.4°F | Ht 62.0 in | Wt 302.0 lb

## 2011-11-05 DIAGNOSIS — IMO0002 Reserved for concepts with insufficient information to code with codable children: Secondary | ICD-10-CM

## 2011-11-05 DIAGNOSIS — M1710 Unilateral primary osteoarthritis, unspecified knee: Secondary | ICD-10-CM | POA: Diagnosis not present

## 2011-11-05 MED ORDER — HYDROCODONE-ACETAMINOPHEN 5-325 MG PO TABS: ORAL_TABLET | ORAL | Status: DC

## 2011-11-05 NOTE — Patient Instructions (Addendum)
I recommend EXERCISE every day. I want you to work up to 40 or 45 minutes of walking a day. Initially you may get out of breath---slow down\and take a breather. Low intensity but DAILY  Exercise of 45 minutes is your goal.

## 2011-11-08 ENCOUNTER — Encounter: Payer: Self-pay | Admitting: Family Medicine

## 2011-11-08 NOTE — Assessment & Plan Note (Signed)
Doing well knew pain med regimen Encouraged wt loss rtc 71m f/u DM and other chronic issues

## 2011-11-08 NOTE — Progress Notes (Signed)
  Subjective:    Patient ID: [name], male    DOB: 20-Mar-1961, 51 y.o.   MRN: [id number]  HPI R knee pain f/u We started some pain meds l ov Using 1 pill bid or tid w significant improvementast   Review of Systems    Pertinent review of systems: negative for fever or unusual weight change.  Objective:   Physical Exam  Vital signs reviewed. GENERAL: Well developed, well nourished, no acute distress R KNEE no effusion  No redness No warmth FROM      Assessment & Plan:

## 2011-12-04 ENCOUNTER — Ambulatory Visit (INDEPENDENT_AMBULATORY_CARE_PROVIDER_SITE_OTHER): Payer: Medicare Other | Admitting: *Deleted

## 2011-12-04 ENCOUNTER — Encounter: Payer: Self-pay | Admitting: Internal Medicine

## 2011-12-04 DIAGNOSIS — I428 Other cardiomyopathies: Secondary | ICD-10-CM

## 2011-12-04 LAB — ICD DEVICE OBSERVATION
BATTERY VOLTAGE: 3.04 V
BRDY-0002RV: 40 {beats}/min
CHARGE TIME: 8.27 s
DEV-0020ICD: NEGATIVE
FVT: 1
PACEART VT: 39
RV LEAD AMPLITUDE: 7.2 mv
RV LEAD IMPEDENCE ICD: 392 Ohm
RV LEAD THRESHOLD: 1 V
TZAT-0001FASTVT: 1
TZAT-0001FASTVT: 3
TZAT-0001FASTVT: 4
TZAT-0001FASTVT: 5
TZAT-0001FASTVT: 6
TZAT-0001SLOWVT: 1
TZAT-0001SLOWVT: 2
TZAT-0001SLOWVT: 3
TZAT-0001SLOWVT: 4
TZAT-0001SLOWVT: 5
TZAT-0001SLOWVT: 6
TZAT-0002FASTVT: NEGATIVE
TZAT-0002FASTVT: NEGATIVE
TZAT-0002FASTVT: NEGATIVE
TZAT-0002FASTVT: NEGATIVE
TZAT-0002SLOWVT: NEGATIVE
TZAT-0002SLOWVT: NEGATIVE
TZAT-0002SLOWVT: NEGATIVE
TZAT-0002SLOWVT: NEGATIVE
TZAT-0002SLOWVT: NEGATIVE
TZAT-0002SLOWVT: NEGATIVE
TZAT-0004FASTVT: 8
TZAT-0005FASTVT: 88 pct
TZAT-0011FASTVT: 10 ms
TZAT-0012FASTVT: 200 ms
TZAT-0012FASTVT: 200 ms
TZAT-0012FASTVT: 200 ms
TZAT-0012FASTVT: 200 ms
TZAT-0012FASTVT: 200 ms
TZAT-0012SLOWVT: 200 ms
TZAT-0012SLOWVT: 200 ms
TZAT-0012SLOWVT: 200 ms
TZAT-0012SLOWVT: 200 ms
TZAT-0012SLOWVT: 200 ms
TZAT-0012SLOWVT: 200 ms
TZAT-0013FASTVT: 1
TZAT-0018FASTVT: NEGATIVE
TZAT-0018FASTVT: NEGATIVE
TZAT-0018FASTVT: NEGATIVE
TZAT-0018FASTVT: NEGATIVE
TZAT-0018FASTVT: NEGATIVE
TZAT-0018SLOWVT: NEGATIVE
TZAT-0018SLOWVT: NEGATIVE
TZAT-0018SLOWVT: NEGATIVE
TZAT-0018SLOWVT: NEGATIVE
TZAT-0018SLOWVT: NEGATIVE
TZAT-0018SLOWVT: NEGATIVE
TZAT-0019FASTVT: 8 V
TZAT-0019FASTVT: 8 V
TZAT-0019FASTVT: 8 V
TZAT-0019FASTVT: 8 V
TZAT-0019FASTVT: 8 V
TZAT-0019SLOWVT: 8 V
TZAT-0019SLOWVT: 8 V
TZAT-0019SLOWVT: 8 V
TZAT-0019SLOWVT: 8 V
TZAT-0019SLOWVT: 8 V
TZAT-0019SLOWVT: 8 V
TZAT-0020FASTVT: 1.6 ms
TZAT-0020FASTVT: 1.6 ms
TZAT-0020FASTVT: 1.6 ms
TZAT-0020FASTVT: 1.6 ms
TZAT-0020FASTVT: 1.6 ms
TZAT-0020SLOWVT: 1.6 ms
TZAT-0020SLOWVT: 1.6 ms
TZAT-0020SLOWVT: 1.6 ms
TZAT-0020SLOWVT: 1.6 ms
TZAT-0020SLOWVT: 1.6 ms
TZAT-0020SLOWVT: 1.6 ms
TZON-0003FASTVT: 260 ms
TZON-0003SLOWVT: 400 ms
TZON-0004SLOWVT: 20
TZON-0005SLOWVT: 12
TZON-0008FASTVT: 0 ms
TZON-0008SLOWVT: 0 ms
TZON-0011AFLUTTER: 70
TZST-0001FASTVT: 2
TZST-0003FASTVT: 35 J
VENTRICULAR PACING ICD: 0 pct
VF: 0

## 2011-12-04 NOTE — Progress Notes (Signed)
defib check in clinic  

## 2011-12-14 ENCOUNTER — Other Ambulatory Visit: Payer: Self-pay | Admitting: Cardiology

## 2011-12-24 DIAGNOSIS — E119 Type 2 diabetes mellitus without complications: Secondary | ICD-10-CM | POA: Diagnosis not present

## 2011-12-24 DIAGNOSIS — I509 Heart failure, unspecified: Secondary | ICD-10-CM | POA: Diagnosis not present

## 2011-12-24 DIAGNOSIS — I10 Essential (primary) hypertension: Secondary | ICD-10-CM | POA: Diagnosis not present

## 2011-12-24 DIAGNOSIS — E7800 Pure hypercholesterolemia, unspecified: Secondary | ICD-10-CM | POA: Diagnosis not present

## 2011-12-25 DIAGNOSIS — E7800 Pure hypercholesterolemia, unspecified: Secondary | ICD-10-CM | POA: Diagnosis not present

## 2011-12-25 DIAGNOSIS — I10 Essential (primary) hypertension: Secondary | ICD-10-CM | POA: Diagnosis not present

## 2011-12-25 DIAGNOSIS — E119 Type 2 diabetes mellitus without complications: Secondary | ICD-10-CM | POA: Diagnosis not present

## 2012-02-11 ENCOUNTER — Encounter: Payer: Self-pay | Admitting: Family Medicine

## 2012-02-11 ENCOUNTER — Ambulatory Visit (INDEPENDENT_AMBULATORY_CARE_PROVIDER_SITE_OTHER): Payer: Medicare Other | Admitting: Family Medicine

## 2012-02-11 VITALS — BP 108/63 | HR 66 | Temp 98.1°F | Ht 62.0 in | Wt 319.0 lb

## 2012-02-11 DIAGNOSIS — E119 Type 2 diabetes mellitus without complications: Secondary | ICD-10-CM | POA: Diagnosis not present

## 2012-02-11 DIAGNOSIS — I10 Essential (primary) hypertension: Secondary | ICD-10-CM

## 2012-02-11 DIAGNOSIS — Z23 Encounter for immunization: Secondary | ICD-10-CM

## 2012-02-11 LAB — POCT GLYCOSYLATED HEMOGLOBIN (HGB A1C): Hemoglobin A1C: 7.3

## 2012-02-11 NOTE — Assessment & Plan Note (Signed)
Fairly well-controlled without any side effects. No medication changes. Labs at next office visit.

## 2012-02-11 NOTE — Assessment & Plan Note (Signed)
Long discussion about absolute need for some exercise that is regular. One of his concerns is that a few exercises his heart rate will go up and his implantable defibrillator we'll go off. We discussed that.

## 2012-02-11 NOTE — Assessment & Plan Note (Signed)
His diabetes numbers her looking good with an A1c of 7.3. He does need to lose weight and we discussed. Will check his creatinine at next office visit.

## 2012-02-11 NOTE — Patient Instructions (Addendum)
Your blood pressure looks really good.  We gave you a flu shot today. That pretty much catches you up on her preventive maintenance.  The only other thing I would recommend is weight loss. I really think walking 45-60 minutes a day is your best bet. You used to be quite a bit of walking and I would try to restart that. Let me see you back in 3-4 months.

## 2012-02-11 NOTE — Progress Notes (Signed)
  Subjective:    Patient ID: [name], male    DOB: 09-05-1960, 51 y.o.   MRN: [id number]  HPI  #1.Follow up diabetes mellitus: Taking all medicines regularly and without problems. No episodes of low blood sugar. No blurry vision or increased urination. #2. HTN F/U: Taking medicines regularly without problems. Denies chest pain, shortness of breath. #79F/u cardiomyopathy---no increase in his shortness of breath. He has not been exercising. He notes that he has gained weight even know he's tried cutting back on his food. Denies chest pains. Denies lower extremity edema.  Review of Systems See history of present illness.    Objective:   Physical Exam  Vital signs reviewed GENERALl: Well developed, well nourished, in no acute distress. NECK: Supple, FROM, without lymphadenopathy.  THYROID: normal without nodularity CAROTID ARTERIES: without bruits LUNGS: clear to auscultation bilaterally. No wheezes or rales. HEART: Regular rate and rhythm, no murmurs ABDOMEN: soft with positive bowel sounds MSK: MOE x 4 SKIN no rash NEURO: no focal deficits       Assessment & Plan:

## 2012-03-08 ENCOUNTER — Encounter: Payer: Self-pay | Admitting: Internal Medicine

## 2012-03-08 ENCOUNTER — Ambulatory Visit (INDEPENDENT_AMBULATORY_CARE_PROVIDER_SITE_OTHER): Payer: Medicare Other | Admitting: *Deleted

## 2012-03-08 DIAGNOSIS — I428 Other cardiomyopathies: Secondary | ICD-10-CM | POA: Diagnosis not present

## 2012-03-08 LAB — ICD DEVICE OBSERVATION
BATTERY VOLTAGE: 3 V
BRDY-0002RV: 40 {beats}/min
CHARGE TIME: 8.41 s
DEV-0020ICD: NEGATIVE
FVT: 0
PACEART VT: 1
RV LEAD AMPLITUDE: 6.9 mv
RV LEAD IMPEDENCE ICD: 384 Ohm
RV LEAD THRESHOLD: 1 V
TZAT-0001FASTVT: 1
TZAT-0001FASTVT: 3
TZAT-0001FASTVT: 4
TZAT-0001FASTVT: 5
TZAT-0001FASTVT: 6
TZAT-0001SLOWVT: 1
TZAT-0001SLOWVT: 2
TZAT-0001SLOWVT: 3
TZAT-0001SLOWVT: 4
TZAT-0001SLOWVT: 5
TZAT-0001SLOWVT: 6
TZAT-0002FASTVT: NEGATIVE
TZAT-0002FASTVT: NEGATIVE
TZAT-0002FASTVT: NEGATIVE
TZAT-0002FASTVT: NEGATIVE
TZAT-0002SLOWVT: NEGATIVE
TZAT-0002SLOWVT: NEGATIVE
TZAT-0002SLOWVT: NEGATIVE
TZAT-0002SLOWVT: NEGATIVE
TZAT-0002SLOWVT: NEGATIVE
TZAT-0002SLOWVT: NEGATIVE
TZAT-0004FASTVT: 8
TZAT-0005FASTVT: 88 pct
TZAT-0011FASTVT: 10 ms
TZAT-0012FASTVT: 200 ms
TZAT-0012FASTVT: 200 ms
TZAT-0012FASTVT: 200 ms
TZAT-0012FASTVT: 200 ms
TZAT-0012FASTVT: 200 ms
TZAT-0012SLOWVT: 200 ms
TZAT-0012SLOWVT: 200 ms
TZAT-0012SLOWVT: 200 ms
TZAT-0012SLOWVT: 200 ms
TZAT-0012SLOWVT: 200 ms
TZAT-0012SLOWVT: 200 ms
TZAT-0013FASTVT: 1
TZAT-0018FASTVT: NEGATIVE
TZAT-0018FASTVT: NEGATIVE
TZAT-0018FASTVT: NEGATIVE
TZAT-0018FASTVT: NEGATIVE
TZAT-0018FASTVT: NEGATIVE
TZAT-0018SLOWVT: NEGATIVE
TZAT-0018SLOWVT: NEGATIVE
TZAT-0018SLOWVT: NEGATIVE
TZAT-0018SLOWVT: NEGATIVE
TZAT-0018SLOWVT: NEGATIVE
TZAT-0018SLOWVT: NEGATIVE
TZAT-0019FASTVT: 8 V
TZAT-0019FASTVT: 8 V
TZAT-0019FASTVT: 8 V
TZAT-0019FASTVT: 8 V
TZAT-0019FASTVT: 8 V
TZAT-0019SLOWVT: 8 V
TZAT-0019SLOWVT: 8 V
TZAT-0019SLOWVT: 8 V
TZAT-0019SLOWVT: 8 V
TZAT-0019SLOWVT: 8 V
TZAT-0019SLOWVT: 8 V
TZAT-0020FASTVT: 1.6 ms
TZAT-0020FASTVT: 1.6 ms
TZAT-0020FASTVT: 1.6 ms
TZAT-0020FASTVT: 1.6 ms
TZAT-0020FASTVT: 1.6 ms
TZAT-0020SLOWVT: 1.6 ms
TZAT-0020SLOWVT: 1.6 ms
TZAT-0020SLOWVT: 1.6 ms
TZAT-0020SLOWVT: 1.6 ms
TZAT-0020SLOWVT: 1.6 ms
TZAT-0020SLOWVT: 1.6 ms
TZON-0003FASTVT: 260 ms
TZON-0003SLOWVT: 400 ms
TZON-0004SLOWVT: 20
TZON-0005SLOWVT: 12
TZON-0008FASTVT: 0 ms
TZON-0008SLOWVT: 0 ms
TZON-0011AFLUTTER: 70
TZST-0001FASTVT: 2
TZST-0003FASTVT: 35 J
VENTRICULAR PACING ICD: 0 pct
VF: 0

## 2012-03-08 NOTE — Patient Instructions (Signed)
Return office visit in 3 months with Dr Caryl Comes.

## 2012-03-08 NOTE — Progress Notes (Signed)
defib check in clinic  

## 2012-03-24 DIAGNOSIS — E7800 Pure hypercholesterolemia, unspecified: Secondary | ICD-10-CM | POA: Diagnosis not present

## 2012-03-24 DIAGNOSIS — I5020 Unspecified systolic (congestive) heart failure: Secondary | ICD-10-CM | POA: Diagnosis not present

## 2012-03-24 DIAGNOSIS — I10 Essential (primary) hypertension: Secondary | ICD-10-CM | POA: Diagnosis not present

## 2012-03-24 DIAGNOSIS — E119 Type 2 diabetes mellitus without complications: Secondary | ICD-10-CM | POA: Diagnosis not present

## 2012-04-01 ENCOUNTER — Ambulatory Visit (INDEPENDENT_AMBULATORY_CARE_PROVIDER_SITE_OTHER): Payer: Medicare Other | Admitting: Family Medicine

## 2012-04-01 ENCOUNTER — Encounter: Payer: Self-pay | Admitting: Family Medicine

## 2012-04-01 ENCOUNTER — Ambulatory Visit
Admission: RE | Admit: 2012-04-01 | Discharge: 2012-04-01 | Disposition: A | Discharge status: Home / Self Care | Payer: Medicare Other | Source: Ambulatory Visit | Attending: Family Medicine | Admitting: Family Medicine

## 2012-04-01 VITALS — BP 147/85 | HR 73 | Temp 98.4°F | Ht 62.0 in | Wt 325.0 lb

## 2012-04-01 DIAGNOSIS — M549 Dorsalgia, unspecified: Secondary | ICD-10-CM

## 2012-04-01 DIAGNOSIS — M545 Low back pain, unspecified: Secondary | ICD-10-CM | POA: Diagnosis not present

## 2012-04-01 DIAGNOSIS — R300 Dysuria: Secondary | ICD-10-CM | POA: Diagnosis not present

## 2012-04-01 LAB — POCT URINALYSIS DIPSTICK
Bilirubin, UA: NEGATIVE
Blood, UA: NEGATIVE
Glucose, UA: NEGATIVE
Ketones, UA: NEGATIVE
Leukocytes, UA: NEGATIVE
Nitrite, UA: NEGATIVE
Protein, UA: NEGATIVE
Spec Grav, UA: 1.025
Urobilinogen, UA: 0.2
pH, UA: 6

## 2012-04-01 NOTE — Progress Notes (Signed)
  Subjective:    Patient ID: [name], male    DOB: [birth year], 50 y.o.   MRN: [id number]  HPI  BACK PAIN  Location: R lower back Quality: achy sometimes feels a knot Onset: about 3 weeks ago Worse with: movement Better with: usually rest Radiation: none Trauma: none.    Has not taken any pain medications.   A relative suggested he could have a kidney infection so he has been drinking juice without improvement.  No dysuria  Red Flags Fecal/urinary incontinence: no  Numbness/Weakness: no  Fever/chills/sweats: no  Night pain: no  Unexplained weight loss: no  No relief with bedrest: no  h/o cancer/immunosuppression: no  IV drug use: no  PMH of osteoporosis or chronic steroid use: no    PMH Unable to take NSAIDs due to cardiac condition according to patient Diabetes has been well controlled   Review of Systems     Objective:   Physical Exam Alert no acute distress obese Able to walk on heels and toes. Back - pain increased with backward bend better with forward flexion.  Tender over right lumbar paraspinous area.  No verbebral body tenderness or deformity No SLR or hip rotation pain Lungs:  Normal respiratory effort, chest expands symmetrically. Lungs are clear to auscultation, no crackles or wheezes.        Assessment & Plan:

## 2012-04-01 NOTE — Assessment & Plan Note (Signed)
New onset.   Most consistent with musculoskeletal pain however given no improvement in almost a month and his comorbidities will get xray and follow.  Can use his narcotic had for knee for pain relief

## 2012-04-01 NOTE — Patient Instructions (Addendum)
I think you are having muscle spasms in your back.   We will check an xray to make sure there is nothing in your bones causing it  Use ice or heat on that area, take your knee pain medicine as needed, and do gentle stretching  If the pain is not better in 2-3 weeks come back to see Dr Nori Riis.  It make take several months to go away completely   I will call you if anything is wrong with the xray

## 2012-05-25 ENCOUNTER — Other Ambulatory Visit: Payer: Self-pay | Admitting: Family Medicine

## 2012-05-27 ENCOUNTER — Other Ambulatory Visit: Payer: Self-pay | Admitting: Family Medicine

## 2012-05-27 NOTE — Telephone Encounter (Signed)
Caregiver is calling about the Rx for his Hydrocodone and the pharmacy is still waiting for the approval.

## 2012-05-27 NOTE — Telephone Encounter (Signed)
Dear Dema Severin Team Please call in [facility]! [name]

## 2012-05-28 NOTE — Telephone Encounter (Signed)
rx was called in and pt was notified. [name], [name]

## 2012-06-02 ENCOUNTER — Encounter: Payer: Self-pay | Admitting: Family Medicine

## 2012-06-02 ENCOUNTER — Ambulatory Visit (INDEPENDENT_AMBULATORY_CARE_PROVIDER_SITE_OTHER): Payer: Medicare Other | Admitting: Family Medicine

## 2012-06-02 VITALS — BP 126/70 | HR 68 | Temp 98.1°F | Ht 62.0 in | Wt 334.0 lb

## 2012-06-02 DIAGNOSIS — I10 Essential (primary) hypertension: Secondary | ICD-10-CM

## 2012-06-02 DIAGNOSIS — I428 Other cardiomyopathies: Secondary | ICD-10-CM | POA: Diagnosis not present

## 2012-06-02 DIAGNOSIS — N183 Chronic kidney disease, stage 3 unspecified: Secondary | ICD-10-CM

## 2012-06-02 DIAGNOSIS — E119 Type 2 diabetes mellitus without complications: Secondary | ICD-10-CM | POA: Diagnosis not present

## 2012-06-02 LAB — BASIC METABOLIC PANEL
BUN: 17 mg/dL (ref 6–23)
CO2: 26 mEq/L (ref 19–32)
Calcium: 9.4 mg/dL (ref 8.4–10.5)
Chloride: 105 mEq/L (ref 96–112)
Creat: 1.22 mg/dL (ref 0.50–1.35)
Glucose, Bld: 133 mg/dL — ABNORMAL HIGH (ref 70–99)
Potassium: 4.5 mEq/L (ref 3.5–5.3)
Sodium: 139 mEq/L (ref 135–145)

## 2012-06-02 LAB — POCT GLYCOSYLATED HEMOGLOBIN (HGB A1C): Hemoglobin A1C: 7.6

## 2012-06-02 MED ORDER — FUROSEMIDE 40 MG PO TABS: 40.0000 mg | ORAL_TABLET | Freq: Every day | ORAL | Status: DC

## 2012-06-02 NOTE — Assessment & Plan Note (Addendum)
We'll go ahead and start Lasix 40 mg daily. I will get electrolytes and creatinine today. He has followup in 3-4 weeks with his cardiologist and I will send him this note. He will need repeat labs in a month's period. He is having some fluid retention am hesitant to do too much regarding his volume status because of his dilated cardiomyopathy and his tenuous status. Also noted is his stage IIIc CKD.

## 2012-06-02 NOTE — Assessment & Plan Note (Signed)
We'll recheck his creatinine today in follow this fairly closely. At some point in the fairly near future we probably need to get him in with nephrology.

## 2012-06-02 NOTE — Assessment & Plan Note (Signed)
His blood sugars have been good but his weight is out of control. We discussed. He will followup to 3 months.

## 2012-06-02 NOTE — Progress Notes (Signed)
  Subjective:    Patient ID: [name], male    DOB: [birth year], 52 y.o.   MRN: [id number]  HPI #1. Followup right sided low back pain. Some better. He is worse when he does a lot of standing or walking. It does not really radiate to his leg or foot. He's had no leg weakness. Denies any urinary or fecal incontinence. Pain right now is 2/10. #2. Followup cardiovascular disease. He's not been doing any walking recently because his back has been bothering him. He has gained some weight. He thinks a lot of it is fluid weight and he notes that he has lower extremity edema now but she's not had for some time. His weight is also up quite a bit. #3. Diabetes mellitus. No episodes of low blood sugar. Taking his medicines regularly.   Review of Systems Denies chest pain, fever, sweats, chills. See history of present illness above.    Objective:   Physical Exam  Vital signs are reviewed and noted his weight is up at least 10 pounds. Blood pressure is well controlled GENERAL: Obese male no acute distress PROGRESS or: Regular rate and rhythm LUNGS: Clear to auscultation bilaterally EXTREMITY: Lower extremity 1+ pitting edema bilateral ankles.      Assessment & Plan:

## 2012-06-04 ENCOUNTER — Encounter: Payer: Self-pay | Admitting: Family Medicine

## 2012-06-08 ENCOUNTER — Encounter: Payer: Self-pay | Admitting: Internal Medicine

## 2012-06-08 ENCOUNTER — Ambulatory Visit (INDEPENDENT_AMBULATORY_CARE_PROVIDER_SITE_OTHER): Payer: Medicare Other | Admitting: Internal Medicine

## 2012-06-08 VITALS — BP 128/70 | HR 74 | Ht 62.0 in | Wt 339.0 lb

## 2012-06-08 DIAGNOSIS — Z95810 Presence of automatic (implantable) cardiac defibrillator: Secondary | ICD-10-CM

## 2012-06-08 DIAGNOSIS — G4733 Obstructive sleep apnea (adult) (pediatric): Secondary | ICD-10-CM | POA: Diagnosis not present

## 2012-06-08 DIAGNOSIS — I428 Other cardiomyopathies: Secondary | ICD-10-CM | POA: Diagnosis not present

## 2012-06-08 DIAGNOSIS — I5022 Chronic systolic (congestive) heart failure: Secondary | ICD-10-CM

## 2012-06-08 LAB — ICD DEVICE OBSERVATION
BATTERY VOLTAGE: 3 V
BRDY-0002RV: 40 {beats}/min
CHARGE TIME: 8.41 s
DEV-0020ICD: NEGATIVE
FVT: 0
PACEART VT: 3
RV LEAD AMPLITUDE: 7.7 mv
RV LEAD IMPEDENCE ICD: 416 Ohm
RV LEAD THRESHOLD: 1 V
TZAT-0001FASTVT: 1
TZAT-0001FASTVT: 3
TZAT-0001FASTVT: 4
TZAT-0001FASTVT: 5
TZAT-0001FASTVT: 6
TZAT-0001SLOWVT: 1
TZAT-0001SLOWVT: 2
TZAT-0001SLOWVT: 3
TZAT-0001SLOWVT: 4
TZAT-0001SLOWVT: 5
TZAT-0001SLOWVT: 6
TZAT-0002FASTVT: NEGATIVE
TZAT-0002FASTVT: NEGATIVE
TZAT-0002FASTVT: NEGATIVE
TZAT-0002FASTVT: NEGATIVE
TZAT-0002SLOWVT: NEGATIVE
TZAT-0002SLOWVT: NEGATIVE
TZAT-0002SLOWVT: NEGATIVE
TZAT-0002SLOWVT: NEGATIVE
TZAT-0002SLOWVT: NEGATIVE
TZAT-0002SLOWVT: NEGATIVE
TZAT-0004FASTVT: 8
TZAT-0005FASTVT: 88 pct
TZAT-0011FASTVT: 10 ms
TZAT-0012FASTVT: 200 ms
TZAT-0012FASTVT: 200 ms
TZAT-0012FASTVT: 200 ms
TZAT-0012FASTVT: 200 ms
TZAT-0012FASTVT: 200 ms
TZAT-0012SLOWVT: 200 ms
TZAT-0012SLOWVT: 200 ms
TZAT-0012SLOWVT: 200 ms
TZAT-0012SLOWVT: 200 ms
TZAT-0012SLOWVT: 200 ms
TZAT-0012SLOWVT: 200 ms
TZAT-0013FASTVT: 1
TZAT-0018FASTVT: NEGATIVE
TZAT-0018FASTVT: NEGATIVE
TZAT-0018FASTVT: NEGATIVE
TZAT-0018FASTVT: NEGATIVE
TZAT-0018FASTVT: NEGATIVE
TZAT-0018SLOWVT: NEGATIVE
TZAT-0018SLOWVT: NEGATIVE
TZAT-0018SLOWVT: NEGATIVE
TZAT-0018SLOWVT: NEGATIVE
TZAT-0018SLOWVT: NEGATIVE
TZAT-0018SLOWVT: NEGATIVE
TZAT-0019FASTVT: 8 V
TZAT-0019FASTVT: 8 V
TZAT-0019FASTVT: 8 V
TZAT-0019FASTVT: 8 V
TZAT-0019FASTVT: 8 V
TZAT-0019SLOWVT: 8 V
TZAT-0019SLOWVT: 8 V
TZAT-0019SLOWVT: 8 V
TZAT-0019SLOWVT: 8 V
TZAT-0019SLOWVT: 8 V
TZAT-0019SLOWVT: 8 V
TZAT-0020FASTVT: 1.6 ms
TZAT-0020FASTVT: 1.6 ms
TZAT-0020FASTVT: 1.6 ms
TZAT-0020FASTVT: 1.6 ms
TZAT-0020FASTVT: 1.6 ms
TZAT-0020SLOWVT: 1.6 ms
TZAT-0020SLOWVT: 1.6 ms
TZAT-0020SLOWVT: 1.6 ms
TZAT-0020SLOWVT: 1.6 ms
TZAT-0020SLOWVT: 1.6 ms
TZAT-0020SLOWVT: 1.6 ms
TZON-0003FASTVT: 260 ms
TZON-0003SLOWVT: 400 ms
TZON-0004SLOWVT: 32
TZON-0005SLOWVT: 12
TZON-0008FASTVT: 0 ms
TZON-0008SLOWVT: 0 ms
TZON-0011AFLUTTER: 70
TZST-0001FASTVT: 2
TZST-0003FASTVT: 35 J
VENTRICULAR PACING ICD: 0 pct
VF: 0

## 2012-06-08 NOTE — Assessment & Plan Note (Signed)
contniure current meds

## 2012-06-08 NOTE — Assessment & Plan Note (Signed)
euvolemic continue current meds 

## 2012-06-08 NOTE — Patient Instructions (Signed)
Your physician recommends that you schedule a follow-up appointment in: 3 months with Kristin/Paula for a device check.  Your physician wants you to follow-up in: 1 year with Dr. Caryl Comes. You will receive a reminder letter in the mail two months in advance. If you don't receive a letter, please call our office to schedule the follow-up appointment.  Your physician recommends that you continue on your current medications as directed. Please refer to the Current Medication list given to you today.

## 2012-06-08 NOTE — Assessment & Plan Note (Signed)
The patient's device was interrogated.  The information was reviewed. No changes were made in the programming.    

## 2012-06-08 NOTE — Progress Notes (Signed)
f Patient Care Team: Dickie La, MD as PCP - General   HPI  [name] is a 52 y.o. male seen in followup for an ICD implanted in the setting of nonischemic heart disease and congestive heart failure.  He is doing pretty well. He had shortness of breath and some peripheral edema.   He has sleep apnea. He has not tolerated wearing the mask. He has not seen Dr. Halford Chessman in some time.  Past Medical History  Diagnosis Date  . Severe hypertension   . Chest pain   . Cardiomyopathy     with borderline ejection fraction in this 52 year old male  . Arthritis   . Cataracts, bilateral     right  . Congestive heart failure   . Diabetes mellitus     does not check blood sugars at home  . Glaucoma(365)     bilateral  . Hyperlipidemia   . Hypertension   . H/O TIA (transient ischemic attack) and stroke     with heart issues    Past Surgical History  Procedure Laterality Date  . Cardiac catheterization  10/24/2003    EF of 45-50%  . Cardiac defibrillator placement  2008  . Cataract extraction  2012    right  . Shoulder surgery  2012    left    Current Outpatient Prescriptions  Medication Sig Dispense Refill  . amLODipine (NORVASC) 10 MG tablet Take 10 mg by mouth daily.        Marland Kitchen aspirin 325 MG tablet Take 325 mg by mouth daily.        . brimonidine (ALPHAGAN) 0.2 % ophthalmic solution Place 1 drop into both eyes every 8 (eight) hours.       . carvedilol (COREG) 25 MG tablet Take 1 tablet (25 mg total) by mouth 2 (two) times daily with a meal.      . dorzolamide (TRUSOPT) 2 % ophthalmic solution       . furosemide (LASIX) 40 MG tablet Take 1 tablet (40 mg total) by mouth daily.  30 tablet  3  . HYDROcodone-acetaminophen (NORCO/VICODIN) 5-325 MG per tablet TAKE ONE TABLET BY MOUTH TWICE DAILY AS NEEDED FOR  KNEE  PAIN  60 tablet  5  . isosorbide mononitrate (ISMO,MONOKET) 20 MG tablet Take 1 tablet (20 mg total) by mouth 2 (two) times daily.  180 tablet  3  . latanoprost  (XALATAN) 0.005 % ophthalmic solution Place 1 drop into both eyes at bedtime.       Marland Kitchen losartan (COZAAR) 100 MG tablet Take 100 mg by mouth daily.        . metFORMIN (GLUCOPHAGE) 500 MG tablet Take 500 mg by mouth 2 (two) times daily with a meal.        . pravastatin (PRAVACHOL) 80 MG tablet Take 80 mg by mouth daily.        Marland Kitchen spironolactone (ALDACTONE) 25 MG tablet Take 25 mg by mouth daily. Per Dr. Glennon Mac       . timolol (TIMOPTIC) 0.5 % ophthalmic solution Place 1 drop into both eyes 2 (two) times daily.       . [DISCONTINUED] lisinopril (PRINIVIL,ZESTRIL) 40 MG tablet Take 40 mg by mouth 2 (two) times daily.         No current facility-administered medications for this visit.    No Known Allergies  Review of Systems negative except from HPI and PMH  Physical Exam BP 128/70  Pulse 74  Ht 5\' 2"  (1.575 m)  Wt 339 lb (153.769 kg)  BMI 61.99 kg/m2 Well developed and morbidly obese in no acute distress HENT normal E scleral and icterus clear Neck Supple JVP flat; carotids brisk and full Clear to ausculation  Regular rate and rhythm, no murmurs gallops or rub Soft with active bowel sounds No clubbing cyanosis Trace Edema Alert and oriented, grossly normal motor and sensory function Skin Warm and Dry  ECG demonstrates sinus rhythm at 74 with intervals 18/10/39  Assessment and  Plan

## 2012-06-08 NOTE — Assessment & Plan Note (Signed)
Is not being treated currently. He is getting tangled up in his tubing. I've asked that he followup with Dr. Halford Chessman .I have given him a picture of the CPAP tue holder

## 2012-06-16 NOTE — Telephone Encounter (Signed)
Dr. Nori Riis refilled this prescription on 05/25/2012.  Was called to Pharmacy by Denice Paradise on 05/25/2012.  Lauralyn Primes

## 2012-06-23 DIAGNOSIS — E119 Type 2 diabetes mellitus without complications: Secondary | ICD-10-CM | POA: Diagnosis not present

## 2012-06-23 DIAGNOSIS — I5020 Unspecified systolic (congestive) heart failure: Secondary | ICD-10-CM | POA: Diagnosis not present

## 2012-06-23 DIAGNOSIS — I10 Essential (primary) hypertension: Secondary | ICD-10-CM | POA: Diagnosis not present

## 2012-06-23 DIAGNOSIS — E7800 Pure hypercholesterolemia, unspecified: Secondary | ICD-10-CM | POA: Diagnosis not present

## 2012-07-01 ENCOUNTER — Ambulatory Visit: Payer: Medicare Other | Admitting: Pulmonary Disease

## 2012-07-26 ENCOUNTER — Ambulatory Visit: Payer: Medicare Other | Admitting: Pulmonary Disease

## 2012-09-09 ENCOUNTER — Encounter: Payer: Medicare Other | Admitting: Cardiology

## 2012-09-14 ENCOUNTER — Encounter: Payer: Self-pay | Admitting: Cardiology

## 2012-09-15 ENCOUNTER — Ambulatory Visit (INDEPENDENT_AMBULATORY_CARE_PROVIDER_SITE_OTHER): Payer: Medicare Other | Admitting: Family Medicine

## 2012-09-15 ENCOUNTER — Encounter: Payer: Self-pay | Admitting: Family Medicine

## 2012-09-15 VITALS — BP 124/71 | HR 65 | Temp 99.1°F | Ht 62.0 in | Wt 334.0 lb

## 2012-09-15 DIAGNOSIS — E119 Type 2 diabetes mellitus without complications: Secondary | ICD-10-CM

## 2012-09-15 DIAGNOSIS — N183 Chronic kidney disease, stage 3 unspecified: Secondary | ICD-10-CM | POA: Diagnosis not present

## 2012-09-15 DIAGNOSIS — I428 Other cardiomyopathies: Secondary | ICD-10-CM

## 2012-09-15 LAB — COMPREHENSIVE METABOLIC PANEL
ALT: 38 U/L (ref 0–53)
AST: 40 U/L — ABNORMAL HIGH (ref 0–37)
Albumin: 4.4 g/dL (ref 3.5–5.2)
Alkaline Phosphatase: 75 U/L (ref 39–117)
BUN: 14 mg/dL (ref 6–23)
CO2: 23 mEq/L (ref 19–32)
Calcium: 9.2 mg/dL (ref 8.4–10.5)
Chloride: 104 mEq/L (ref 96–112)
Creat: 1.37 mg/dL — ABNORMAL HIGH (ref 0.50–1.35)
Glucose, Bld: 257 mg/dL — ABNORMAL HIGH (ref 70–99)
Potassium: 4.4 mEq/L (ref 3.5–5.3)
Sodium: 139 mEq/L (ref 135–145)
Total Bilirubin: 0.5 mg/dL (ref 0.3–1.2)
Total Protein: 6.9 g/dL (ref 6.0–8.3)

## 2012-09-15 LAB — LDL CHOLESTEROL, DIRECT: Direct LDL: 127 mg/dL — ABNORMAL HIGH

## 2012-09-15 LAB — POCT GLYCOSYLATED HEMOGLOBIN (HGB A1C): Hemoglobin A1C: 9

## 2012-09-15 MED ORDER — ISOSORBIDE MONONITRATE 20 MG PO TABS: 20.0000 mg | ORAL_TABLET | Freq: Two times a day (BID) | ORAL | Status: DC

## 2012-09-22 ENCOUNTER — Encounter: Payer: Self-pay | Admitting: Family Medicine

## 2012-09-22 DIAGNOSIS — I10 Essential (primary) hypertension: Secondary | ICD-10-CM | POA: Diagnosis not present

## 2012-09-22 DIAGNOSIS — E119 Type 2 diabetes mellitus without complications: Secondary | ICD-10-CM | POA: Diagnosis not present

## 2012-09-22 DIAGNOSIS — I509 Heart failure, unspecified: Secondary | ICD-10-CM | POA: Diagnosis not present

## 2012-09-22 DIAGNOSIS — E7800 Pure hypercholesterolemia, unspecified: Secondary | ICD-10-CM | POA: Diagnosis not present

## 2012-09-22 DIAGNOSIS — I428 Other cardiomyopathies: Secondary | ICD-10-CM | POA: Diagnosis not present

## 2012-09-27 ENCOUNTER — Encounter: Payer: Self-pay | Admitting: Family Medicine

## 2012-09-27 NOTE — Assessment & Plan Note (Signed)
Check BMP--will need to keep a close eye on this

## 2012-09-27 NOTE — Assessment & Plan Note (Signed)
Long discussion regarding elevated A1C. His wife was with him and we talked at length re diet and exercise. rtc 3 m

## 2012-09-27 NOTE — Progress Notes (Signed)
  Subjective:    Patient ID: [name], male    DOB: [birth year], 52 y.o.   MRN: [id number]  HPI  F/u DM. Not following diet very well. Not exercising much. Taking meds---no problems  f/u htn and cardiomyopathy--no new sx of chest pain or worsening DOE. He is at his baseline. F/u OSA--not using regularly because of discomfort wearing the mask.   Review of Systems See hpi     Objective:   Physical Exam  Vital signs reviewed. GENERAL: Well-developed, well-nourished, no acute distress. Obese CARDIOVASCULAR: Regular rate and rhythm no murmur gallop or rub LUNGS: Clear to auscultation bilaterally, no rales or wheeze. ABDOMEN: Soft positive bowel sounds NEURO: No gross focal neurological deficits. MSK: Movement of extremity x 4.        Assessment & Plan:

## 2012-09-27 NOTE — Assessment & Plan Note (Signed)
Long discussion regarding exercise and weigt loss

## 2012-10-05 ENCOUNTER — Encounter: Payer: Self-pay | Admitting: Internal Medicine

## 2012-10-05 ENCOUNTER — Encounter: Payer: Self-pay | Admitting: Cardiology

## 2012-10-05 ENCOUNTER — Ambulatory Visit (INDEPENDENT_AMBULATORY_CARE_PROVIDER_SITE_OTHER): Payer: Medicare Other | Admitting: Cardiology

## 2012-10-05 VITALS — BP 128/88 | HR 77 | Ht 62.0 in | Wt 338.0 lb

## 2012-10-05 DIAGNOSIS — I428 Other cardiomyopathies: Secondary | ICD-10-CM

## 2012-10-05 DIAGNOSIS — I472 Ventricular tachycardia: Secondary | ICD-10-CM

## 2012-10-05 DIAGNOSIS — Z95810 Presence of automatic (implantable) cardiac defibrillator: Secondary | ICD-10-CM

## 2012-10-05 LAB — ICD DEVICE OBSERVATION
BATTERY VOLTAGE: 2.97 V
BRDY-0002RV: 40 {beats}/min
DEV-0020ICD: NEGATIVE
FVT: 1
PACEART VT: 0
RV LEAD AMPLITUDE: 7.4 mv
RV LEAD IMPEDENCE ICD: 416 Ohm
RV LEAD THRESHOLD: 1 V
TZAT-0001FASTVT: 1
TZAT-0001FASTVT: 3
TZAT-0001FASTVT: 4
TZAT-0001FASTVT: 5
TZAT-0001FASTVT: 6
TZAT-0001SLOWVT: 1
TZAT-0001SLOWVT: 2
TZAT-0001SLOWVT: 3
TZAT-0001SLOWVT: 4
TZAT-0001SLOWVT: 5
TZAT-0001SLOWVT: 6
TZAT-0002FASTVT: NEGATIVE
TZAT-0002FASTVT: NEGATIVE
TZAT-0002FASTVT: NEGATIVE
TZAT-0002FASTVT: NEGATIVE
TZAT-0002SLOWVT: NEGATIVE
TZAT-0002SLOWVT: NEGATIVE
TZAT-0002SLOWVT: NEGATIVE
TZAT-0002SLOWVT: NEGATIVE
TZAT-0002SLOWVT: NEGATIVE
TZAT-0002SLOWVT: NEGATIVE
TZAT-0004FASTVT: 8
TZAT-0005FASTVT: 88 pct
TZAT-0011FASTVT: 10 ms
TZAT-0012FASTVT: 200 ms
TZAT-0012FASTVT: 200 ms
TZAT-0012FASTVT: 200 ms
TZAT-0012FASTVT: 200 ms
TZAT-0012FASTVT: 200 ms
TZAT-0012SLOWVT: 200 ms
TZAT-0012SLOWVT: 200 ms
TZAT-0012SLOWVT: 200 ms
TZAT-0012SLOWVT: 200 ms
TZAT-0012SLOWVT: 200 ms
TZAT-0012SLOWVT: 200 ms
TZAT-0013FASTVT: 1
TZAT-0018FASTVT: NEGATIVE
TZAT-0018FASTVT: NEGATIVE
TZAT-0018FASTVT: NEGATIVE
TZAT-0018FASTVT: NEGATIVE
TZAT-0018FASTVT: NEGATIVE
TZAT-0018SLOWVT: NEGATIVE
TZAT-0018SLOWVT: NEGATIVE
TZAT-0018SLOWVT: NEGATIVE
TZAT-0018SLOWVT: NEGATIVE
TZAT-0018SLOWVT: NEGATIVE
TZAT-0018SLOWVT: NEGATIVE
TZAT-0019FASTVT: 8 V
TZAT-0019FASTVT: 8 V
TZAT-0019FASTVT: 8 V
TZAT-0019FASTVT: 8 V
TZAT-0019FASTVT: 8 V
TZAT-0019SLOWVT: 8 V
TZAT-0019SLOWVT: 8 V
TZAT-0019SLOWVT: 8 V
TZAT-0019SLOWVT: 8 V
TZAT-0019SLOWVT: 8 V
TZAT-0019SLOWVT: 8 V
TZAT-0020FASTVT: 1.6 ms
TZAT-0020FASTVT: 1.6 ms
TZAT-0020FASTVT: 1.6 ms
TZAT-0020FASTVT: 1.6 ms
TZAT-0020FASTVT: 1.6 ms
TZAT-0020SLOWVT: 1.6 ms
TZAT-0020SLOWVT: 1.6 ms
TZAT-0020SLOWVT: 1.6 ms
TZAT-0020SLOWVT: 1.6 ms
TZAT-0020SLOWVT: 1.6 ms
TZAT-0020SLOWVT: 1.6 ms
TZON-0003FASTVT: 260 ms
TZON-0003SLOWVT: 400 ms
TZON-0004SLOWVT: 32
TZON-0005SLOWVT: 12
TZON-0008FASTVT: 0 ms
TZON-0008SLOWVT: 0 ms
TZON-0011AFLUTTER: 70
TZST-0001FASTVT: 2
TZST-0003FASTVT: 35 J
VENTRICULAR PACING ICD: 0 pct
VF: 0

## 2012-10-05 NOTE — Progress Notes (Signed)
ICD check in device clinic. [name] reports he is doing well without complaints. Normal ICD function. No programming changes made. See [employer] report.

## 2012-10-05 NOTE — Patient Instructions (Addendum)
Your physician recommends that you schedule a follow-up appointment in: 3 months Defib check  Your physician recommends that you schedule a follow-up appointment in: [place] MARCH 2015  Your physician recommends that you continue on your current medications as directed. Please refer to the Current Medication list given to you today.

## 2012-10-18 ENCOUNTER — Other Ambulatory Visit: Payer: Self-pay | Admitting: Family Medicine

## 2012-10-18 NOTE — Telephone Encounter (Signed)
Patient has called [facility] at [facility] but was told to also call the doctors office for approval for refills on Isosorbide Mononitrate 90 day supply and Furosemide 30 day supply.

## 2012-10-19 ENCOUNTER — Other Ambulatory Visit: Payer: Self-pay | Admitting: *Deleted

## 2012-10-19 MED ORDER — FUROSEMIDE 40 MG PO TABS: 40.0000 mg | ORAL_TABLET | Freq: Every day | ORAL | Status: DC

## 2012-10-19 MED ORDER — ISOSORBIDE MONONITRATE 20 MG PO TABS: 20.0000 mg | ORAL_TABLET | Freq: Two times a day (BID) | ORAL | Status: DC

## 2012-11-09 ENCOUNTER — Emergency Department (HOSPITAL_COMMUNITY)
Admission: EM | Admit: 2012-11-09 | Discharge: 2012-11-09 | Disposition: A | Discharge status: Home / Self Care | Payer: Medicare Other | Attending: Emergency Medicine | Admitting: Emergency Medicine

## 2012-11-09 ENCOUNTER — Encounter (HOSPITAL_COMMUNITY): Payer: Self-pay | Admitting: Radiology

## 2012-11-09 DIAGNOSIS — R0602 Shortness of breath: Secondary | ICD-10-CM | POA: Diagnosis not present

## 2012-11-09 DIAGNOSIS — I10 Essential (primary) hypertension: Secondary | ICD-10-CM | POA: Insufficient documentation

## 2012-11-09 DIAGNOSIS — Z8673 Personal history of transient ischemic attack (TIA), and cerebral infarction without residual deficits: Secondary | ICD-10-CM | POA: Insufficient documentation

## 2012-11-09 DIAGNOSIS — I428 Other cardiomyopathies: Secondary | ICD-10-CM | POA: Diagnosis not present

## 2012-11-09 DIAGNOSIS — Z9849 Cataract extraction status, unspecified eye: Secondary | ICD-10-CM | POA: Diagnosis not present

## 2012-11-09 DIAGNOSIS — Z7982 Long term (current) use of aspirin: Secondary | ICD-10-CM | POA: Insufficient documentation

## 2012-11-09 DIAGNOSIS — T65891A Toxic effect of other specified substances, accidental (unintentional), initial encounter: Secondary | ICD-10-CM | POA: Insufficient documentation

## 2012-11-09 DIAGNOSIS — R0902 Hypoxemia: Secondary | ICD-10-CM | POA: Diagnosis not present

## 2012-11-09 DIAGNOSIS — Z79899 Other long term (current) drug therapy: Secondary | ICD-10-CM | POA: Diagnosis not present

## 2012-11-09 DIAGNOSIS — M129 Arthropathy, unspecified: Secondary | ICD-10-CM | POA: Insufficient documentation

## 2012-11-09 DIAGNOSIS — I959 Hypotension, unspecified: Secondary | ICD-10-CM | POA: Diagnosis not present

## 2012-11-09 DIAGNOSIS — R21 Rash and other nonspecific skin eruption: Secondary | ICD-10-CM | POA: Diagnosis not present

## 2012-11-09 DIAGNOSIS — Y929 Unspecified place or not applicable: Secondary | ICD-10-CM | POA: Insufficient documentation

## 2012-11-09 DIAGNOSIS — Y939 Activity, unspecified: Secondary | ICD-10-CM | POA: Insufficient documentation

## 2012-11-09 DIAGNOSIS — R1084 Generalized abdominal pain: Secondary | ICD-10-CM | POA: Diagnosis not present

## 2012-11-09 DIAGNOSIS — I509 Heart failure, unspecified: Secondary | ICD-10-CM | POA: Diagnosis not present

## 2012-11-09 DIAGNOSIS — T782XXA Anaphylactic shock, unspecified, initial encounter: Secondary | ICD-10-CM | POA: Diagnosis not present

## 2012-11-09 DIAGNOSIS — L259 Unspecified contact dermatitis, unspecified cause: Secondary | ICD-10-CM | POA: Diagnosis not present

## 2012-11-09 DIAGNOSIS — L509 Urticaria, unspecified: Secondary | ICD-10-CM | POA: Diagnosis not present

## 2012-11-09 DIAGNOSIS — L299 Pruritus, unspecified: Secondary | ICD-10-CM | POA: Insufficient documentation

## 2012-11-09 DIAGNOSIS — T783XXA Angioneurotic edema, initial encounter: Secondary | ICD-10-CM | POA: Diagnosis not present

## 2012-11-09 DIAGNOSIS — E785 Hyperlipidemia, unspecified: Secondary | ICD-10-CM | POA: Insufficient documentation

## 2012-11-09 DIAGNOSIS — H409 Unspecified glaucoma: Secondary | ICD-10-CM | POA: Insufficient documentation

## 2012-11-09 DIAGNOSIS — T63461A Toxic effect of venom of wasps, accidental (unintentional), initial encounter: Secondary | ICD-10-CM | POA: Insufficient documentation

## 2012-11-09 DIAGNOSIS — R062 Wheezing: Secondary | ICD-10-CM | POA: Diagnosis not present

## 2012-11-09 MED ORDER — METHYLPREDNISOLONE SODIUM SUCC 125 MG IJ SOLR
125.0000 mg | Freq: Once | INTRAMUSCULAR | Status: AC
  Administered 2012-11-09: 125 mg via INTRAVENOUS
  Filled 2012-11-09: qty 2

## 2012-11-09 MED ORDER — FAMOTIDINE IN NACL 20-0.9 MG/50ML-% IV SOLN
20.0000 mg | Freq: Once | INTRAVENOUS | Status: AC
  Administered 2012-11-09: 20 mg via INTRAVENOUS
  Filled 2012-11-09: qty 50

## 2012-11-09 MED ORDER — EPINEPHRINE 0.3 MG/0.3ML IJ SOAJ: 0.3000 mg | INTRAMUSCULAR | Status: DC | PRN

## 2012-11-09 NOTE — ED Notes (Signed)
Pt presents with anaphylaxis reaction r/t known allergy to bee strings.  Benadryl 50 mg given  Albuterol 5mg 

## 2012-11-09 NOTE — ED Notes (Signed)
MD at bedside. 

## 2012-11-09 NOTE — ED Provider Notes (Signed)
CSN: [id number]     Arrival date & time 11/09/12  1222 History     First MD Initiated Contact with Patient 11/09/12 1225     Chief Complaint  Patient presents with  . Allergic Reaction   (Consider location/radiation/quality/duration/timing/severity/associated sxs/prior Treatment) HPI Comments: Pt stung by a bee just PTA, given benadryl & albuterol in field.  Was initially hypotensive & hypoxic.  Epi not given due to his pacemaker.  Patient is a 52 y.o. male presenting with allergic reaction. The history is provided by the patient. No language interpreter was used.  Allergic Reaction Presenting symptoms: difficulty breathing, itching, rash and wheezing   Presenting symptoms: no difficulty swallowing   Severity:  Severe Prior allergic episodes:  Insect allergies Context: insect bite/sting   Relieved by:  Bronchodilators and antihistamines Worsened by:  Nothing tried   Past Medical History  Diagnosis Date  . Severe hypertension   . Chest pain   . Cardiomyopathy     with borderline ejection fraction in this 52 year old male  . Arthritis   . Cataracts, bilateral     right  . Congestive heart failure   . Diabetes mellitus     does not check blood sugars at home  . Glaucoma     bilateral  . Hyperlipidemia   . Hypertension   . H/O TIA (transient ischemic attack) and stroke     with heart issues   Past Surgical History  Procedure Laterality Date  . Cardiac catheterization  10/24/2003    EF of 45-50%  . Cardiac defibrillator placement  2008  . Cataract extraction  2012    right  . Shoulder surgery  2012    left   Family History  Problem Relation Age of Onset  . Colon cancer Father   . Esophageal cancer Neg Hx   . Rectal cancer Neg Hx   . Stomach cancer Neg Hx    History  Substance Use Topics  . Smoking status: Never Smoker   . Smokeless tobacco: Never Used  . Alcohol Use: No    Review of Systems  Constitutional: Negative for fever, activity change, appetite  change and fatigue.  HENT: Negative for congestion, facial swelling, rhinorrhea and trouble swallowing.   Eyes: Negative for photophobia and pain.  Respiratory: Positive for shortness of breath and wheezing. Negative for cough and chest tightness.   Cardiovascular: Negative for chest pain and leg swelling.  Gastrointestinal: Negative for nausea, vomiting, abdominal pain, diarrhea and constipation.  Endocrine: Negative for polydipsia and polyuria.  Genitourinary: Negative for dysuria, urgency, decreased urine volume and difficulty urinating.  Musculoskeletal: Negative for back pain and gait problem.  Skin: Positive for itching and rash. Negative for color change and wound.  Allergic/Immunologic: Negative for immunocompromised state.  Neurological: Negative for dizziness, facial asymmetry, speech difficulty, weakness, numbness and headaches.  Psychiatric/Behavioral: Negative for confusion, decreased concentration and agitation.    Allergies  Bee pollen  Home Medications   Current Outpatient Rx  Name  Route  Sig  Dispense  Refill  . amLODipine (NORVASC) 10 MG tablet   Oral   Take 10 mg by mouth daily.           Marland Kitchen aspirin 81 MG tablet   Oral   Take 81 mg by mouth daily.         . brimonidine (ALPHAGAN) 0.2 % ophthalmic solution   Both Eyes   Place 1 drop into both eyes 2 (two) times daily.          Marland Kitchen  carvedilol (COREG) 25 MG tablet   Oral   Take 1 tablet (25 mg total) by mouth 2 (two) times daily with a meal.         . dorzolamide (TRUSOPT) 2 % ophthalmic solution   Both Eyes   Place 1 drop into both eyes 2 (two) times daily.          . furosemide (LASIX) 40 MG tablet   Oral   Take 1 tablet (40 mg total) by mouth daily.   30 tablet   3   . HYDROcodone-acetaminophen (NORCO/VICODIN) 5-325 MG per tablet   Oral   Take 1 tablet by mouth 2 (two) times daily as needed for pain.         . isosorbide mononitrate (ISMO,MONOKET) 20 MG tablet   Oral   Take 1 tablet  (20 mg total) by mouth 2 (two) times daily.   180 tablet   3   . latanoprost (XALATAN) 0.005 % ophthalmic solution   Both Eyes   Place 1 drop into both eyes at bedtime.          Marland Kitchen losartan (COZAAR) 100 MG tablet   Oral   Take 100 mg by mouth daily.           . metFORMIN (GLUCOPHAGE) 500 MG tablet   Oral   Take 500 mg by mouth 2 (two) times daily with a meal.           . pravastatin (PRAVACHOL) 80 MG tablet   Oral   Take 80 mg by mouth at bedtime.          Marland Kitchen spironolactone (ALDACTONE) 25 MG tablet   Oral   Take 25 mg by mouth daily. Per Dr. Glennon Mac          . timolol (TIMOPTIC) 0.5 % ophthalmic solution   Both Eyes   Place 1 drop into both eyes 2 (two) times daily.           BP 101/69  Temp(Src) 97.8 F (36.6 C) (Oral)  Resp 24  Ht 5\' 2"  (1.575 m)  Wt 330 lb (149.687 kg)  BMI 60.34 kg/m2  SpO2 90% Physical Exam  Nursing note and vitals reviewed. Constitutional: He is oriented to person, place, and time. He appears well-developed and well-nourished. No distress.  HENT:  Head: Normocephalic and atraumatic.  Mouth/Throat: No oropharyngeal exudate.  Eyes: Pupils are equal, round, and reactive to light.  Neck: Normal range of motion. Neck supple.  Cardiovascular: Normal rate, regular rhythm and normal heart sounds.  Exam reveals no gallop and no friction rub.   No murmur heard. Pulmonary/Chest: Effort normal and breath sounds normal. No respiratory distress. He has no wheezes. He has no rales.  Abdominal: Soft. Bowel sounds are normal. He exhibits no distension and no mass. There is no tenderness. There is no rebound and no guarding.  Musculoskeletal: Normal range of motion. He exhibits no edema and no tenderness.  Neurological: He is alert and oriented to person, place, and time.  Skin: Skin is warm and dry.  Psychiatric: He has a normal mood and affect.    ED Course   Procedures (including critical care time)  Labs Reviewed - No data to display No  results found. No diagnosis found.    MDM  Pt is a 52 y.o. male with pmhx as above who presents w/ concern for anaphylactic reaction to bee sting.  He was reportedly hypoxic & hypotensive in field.  He didn't receive epi in  field due to confusion over contraindication, received only benadryl & albuterol.  Upon arrival, pt asymptomatic, HR nml, no GI, skin, ENT, resp symptoms.  Have ordered IV pepcid, solumedrol, and montitored for 2 hours w/o return of symptoms.  Pt d/c home w/ new rx for epipen, and strict return precautions for return of symptoms.   1. Anaphylaxis, initial encounter      Neta Ehlers, MD 11/09/12 1750

## 2012-11-10 ENCOUNTER — Ambulatory Visit: Payer: Medicare Other | Admitting: Family Medicine

## 2012-11-17 ENCOUNTER — Encounter: Payer: Self-pay | Admitting: Family Medicine

## 2012-11-17 ENCOUNTER — Ambulatory Visit (INDEPENDENT_AMBULATORY_CARE_PROVIDER_SITE_OTHER): Payer: Medicare Other | Admitting: Family Medicine

## 2012-11-17 VITALS — BP 127/72 | HR 72 | Temp 98.9°F | Ht 62.0 in | Wt 327.0 lb

## 2012-11-17 DIAGNOSIS — E119 Type 2 diabetes mellitus without complications: Secondary | ICD-10-CM | POA: Diagnosis not present

## 2012-11-17 DIAGNOSIS — N183 Chronic kidney disease, stage 3 unspecified: Secondary | ICD-10-CM

## 2012-11-17 DIAGNOSIS — I5022 Chronic systolic (congestive) heart failure: Secondary | ICD-10-CM | POA: Diagnosis not present

## 2012-11-17 DIAGNOSIS — G4733 Obstructive sleep apnea (adult) (pediatric): Secondary | ICD-10-CM

## 2012-11-17 DIAGNOSIS — I10 Essential (primary) hypertension: Secondary | ICD-10-CM | POA: Diagnosis not present

## 2012-11-17 LAB — POCT GLYCOSYLATED HEMOGLOBIN (HGB A1C): Hemoglobin A1C: 10.1

## 2012-11-17 LAB — BASIC METABOLIC PANEL
BUN: 14 mg/dL (ref 6–23)
CO2: 26 mEq/L (ref 19–32)
Calcium: 9.5 mg/dL (ref 8.4–10.5)
Chloride: 103 mEq/L (ref 96–112)
Creat: 1.34 mg/dL (ref 0.50–1.35)
Glucose, Bld: 268 mg/dL — ABNORMAL HIGH (ref 70–99)
Potassium: 4.6 mEq/L (ref 3.5–5.3)
Sodium: 137 mEq/L (ref 135–145)

## 2012-11-17 MED ORDER — HYDROCODONE-ACETAMINOPHEN 5-325 MG PO TABS: 1.0000 | ORAL_TABLET | Freq: Two times a day (BID) | ORAL | Status: DC | PRN

## 2012-11-17 NOTE — Assessment & Plan Note (Signed)
Reminded to get his CPAP set up ASAP.

## 2012-11-17 NOTE — Assessment & Plan Note (Signed)
We'll check labs today. Blood pressures pretty well controlled.

## 2012-11-17 NOTE — Progress Notes (Signed)
Subjective:     Patient ID: [name], male   DOB: 03/11/1961, 52 y.o.   MRN: [id number]  HPI This is a 52 yo man w/ hx of T2DM, HTN, chronic systolic heart failure w/ ICD, and OSA presenting for f/u on these issues and for med refill.  T2DM Pt knew he was taking medications for his blood sugar but was unaware he had diabetes. States that he has no concern w/ this issue. Does not have a glucometer to check is blood sugars and home and may not use it if given one. No light-headedness, dizziness, N/V, sweats. No dysuria, polyuria. No tingling, numbness, coldness.  HTN, Sys Heart Failure No concern for high blood pressure and no recent heart issues. Does not monitor BP at home. No chest pain/pressure/palpitations or SOB except w/ recent episode of anaphylaxis on 11/09/2012. He is wondering if he may have asthma, since he has been told before and with recent episode of anaphylaxis that he has wheezing.   OSA Still not using CPAP, although he has the machine. Still needs to make an appointment to configure the settings.   Pt is compliant w/ all meds at current dosing regimen. He states that he needs refill for hydrocodone-acetaminophen.  Diet - Fried/baked chicken and rice. Little consumption of fresh fruits and vegetables.  Walking around the track 4x on occasion with his wife  Review of Systems General: No pain anywhere in body.  GI: No diarrhea, constipation, bloody/tarry stools.   GU: No hematuria.    Objective:   Physical Exam BP 127/72  Pulse 72  Temp(Src) 98.9 F (37.2 C) (Oral)  Ht 5\' 2"  (1.575 m)  Wt 327 lb (148.326 kg)  BMI 59.79 kg/m2 General: NAD. A&Ox3. Speaks in full sentences and answers questions appropriately. Morbidly obese. Weight loss of 11 lbs since 10/05/2012  CV: RRR. Normal S1 and S2. No murmurs, rubs, or gallops. Radial pulses 2+ bilaterally. No peripheral edema.   Pulm: Normal respiratory effort w/o use of accessory muscles. Equal expansion bilaterally.  All lung fields clear and equal to auscultation bilaterally.  Problem List: 1. HTN, CHF, SOB 2. T2DM 3. OSA 4. Med refill    Assessment:     This is a 52 yo man doing well on f/u.    Plan:     1.  HTN,CHF, SOB - Well controlled BP and asymptomatic. Will continue BP meds and heart meds at current dosing regimen.  2. patient has questions about some wheezing. Would like to recheck her asthma. I suspect his intermittent wheezing is related to cardiac asthma but we'll set him up for PFTs. Will have pt schedule appt w/ Dr. Valentina Lucks for PFTs.   3. T2DM - Doing well, has increased physical activity and lost weight. Encouraged pt to add more fruits and vegetables to diet. Continue current dosing of metformin.Will check A1c to ensure good glucose control and BMET to check kidney status.  4. OSA - Encouraged pt to make appointment w/ Dr. Halford Chessman for CPAP settings.   5. Med refill - refilled hydrocodone-acetaminophen  for chronic right knee pain secondary also arthritis..    5. F/u in 3 mos or sooner if needed.

## 2012-11-17 NOTE — Patient Instructions (Addendum)
PLEASE set appear met with Dr. spell Halford Chessman. This is to get your CPAP arranged. It is very important for your heart health.   On the way out, have them  set you up with Dr.  Valentina Lucks to get pulmonary function tests. I will send you a note about your blood work. I will see her back in 3 months unless her blood work indicates that need to see you Center.  You have lost 11 pounds in 5 months. Please keep up the good work.

## 2012-11-17 NOTE — Assessment & Plan Note (Signed)
Discussed importance of regular exercise such as walking.

## 2012-11-25 ENCOUNTER — Ambulatory Visit (INDEPENDENT_AMBULATORY_CARE_PROVIDER_SITE_OTHER): Payer: Medicare Other | Admitting: Pharmacist

## 2012-11-25 ENCOUNTER — Encounter: Payer: Self-pay | Admitting: Pharmacist

## 2012-11-25 VITALS — BP 130/85 | HR 76 | Ht 61.0 in | Wt 327.0 lb

## 2012-11-25 DIAGNOSIS — I5022 Chronic systolic (congestive) heart failure: Secondary | ICD-10-CM | POA: Diagnosis not present

## 2012-11-25 DIAGNOSIS — G4733 Obstructive sleep apnea (adult) (pediatric): Secondary | ICD-10-CM | POA: Diagnosis not present

## 2012-11-25 NOTE — Progress Notes (Signed)
S:    Patient arrives in good spirits and walking without assistance.    He presents to the clinic for lung function evaluation.  Of note patient reports history of working at [employer] for about 15 years  - in the distant past.  Patient reports breathing has been observed to include wheezing.   He states this included at the time of his bee sting but says he can also get short of breath with wheezing when walking.    O: See "scanned report" or Documentation Flowsheet (discrete results - PFTs) for  Spirometry results. Patient provided good effort while attempting spirometry.    A/P: Spirometry evaluation reveals normal lung function at rest today.  Patient complaint of wheezing while exercising and after being stung by bees.  Patient has been experiencing  Shortness of breath with exertion for a long time but the increase in wheezing is concerning to him.  no change in treatment plan at this time.  Patient will continue to monitor wheezing symptoms.   We did NOT do any provocation today and he may have some bronchoconstriction that we have not noticed today.  Reviewed results of pulmonary function tests.  Pt verbalized understanding of results and education.  Written pt instructions provided.   Patient seen with Wilfred Curtis, PharmD Resident

## 2012-11-25 NOTE — Progress Notes (Signed)
Patient ID: [name], male   DOB: [birth year], 52 y.o.   MRN: [id number] I have reviewed this visit and discussed with Lamont Dowdy and agree with her documentation.

## 2012-11-25 NOTE — Assessment & Plan Note (Signed)
Spirometry evaluation reveals normal lung function at rest today.  Patient complaint of wheezing while exercising and after being stung by bees.  Patient has been experiencing  Shortness of breath with exertion for a long time but the increase in wheezing is concerning to him.  no change in treatment plan at this time.  Patient will continue to monitor wheezing symptoms.   We did NOT do any provocation today and he may have some bronchoconstriction that we have not noticed today.  Reviewed results of pulmonary function tests.  Pt verbalized understanding of results and education.  Written pt instructions provided.   Patient seen with [name], PharmD Resident

## 2012-11-25 NOTE — Patient Instructions (Addendum)
Thank you for coming in today!   Your spirometry (lung) test was normal.   Follow up with Dr. Nori Riis.

## 2012-12-31 DIAGNOSIS — I428 Other cardiomyopathies: Secondary | ICD-10-CM | POA: Diagnosis not present

## 2012-12-31 DIAGNOSIS — I509 Heart failure, unspecified: Secondary | ICD-10-CM | POA: Diagnosis not present

## 2012-12-31 DIAGNOSIS — E119 Type 2 diabetes mellitus without complications: Secondary | ICD-10-CM | POA: Diagnosis not present

## 2013-01-05 ENCOUNTER — Ambulatory Visit (INDEPENDENT_AMBULATORY_CARE_PROVIDER_SITE_OTHER): Payer: Medicare Other | Admitting: *Deleted

## 2013-01-05 DIAGNOSIS — I428 Other cardiomyopathies: Secondary | ICD-10-CM | POA: Diagnosis not present

## 2013-01-05 DIAGNOSIS — I5022 Chronic systolic (congestive) heart failure: Secondary | ICD-10-CM

## 2013-01-05 LAB — ICD DEVICE OBSERVATION
BATTERY VOLTAGE: 2.96 V
BRDY-0002RV: 40 {beats}/min
CHARGE TIME: 8.63 s
DEV-0020ICD: NEGATIVE
FVT: 0
PACEART VT: 0
RV LEAD AMPLITUDE: 6.8 mv
RV LEAD IMPEDENCE ICD: 424 Ohm
RV LEAD THRESHOLD: 1 V
TZAT-0001FASTVT: 1
TZAT-0001FASTVT: 3
TZAT-0001FASTVT: 4
TZAT-0001FASTVT: 5
TZAT-0001FASTVT: 6
TZAT-0001SLOWVT: 1
TZAT-0001SLOWVT: 2
TZAT-0001SLOWVT: 3
TZAT-0001SLOWVT: 4
TZAT-0001SLOWVT: 5
TZAT-0001SLOWVT: 6
TZAT-0002FASTVT: NEGATIVE
TZAT-0002FASTVT: NEGATIVE
TZAT-0002FASTVT: NEGATIVE
TZAT-0002FASTVT: NEGATIVE
TZAT-0002SLOWVT: NEGATIVE
TZAT-0002SLOWVT: NEGATIVE
TZAT-0002SLOWVT: NEGATIVE
TZAT-0002SLOWVT: NEGATIVE
TZAT-0002SLOWVT: NEGATIVE
TZAT-0002SLOWVT: NEGATIVE
TZAT-0004FASTVT: 8
TZAT-0005FASTVT: 88 pct
TZAT-0011FASTVT: 10 ms
TZAT-0012FASTVT: 200 ms
TZAT-0012FASTVT: 200 ms
TZAT-0012FASTVT: 200 ms
TZAT-0012FASTVT: 200 ms
TZAT-0012FASTVT: 200 ms
TZAT-0012SLOWVT: 200 ms
TZAT-0012SLOWVT: 200 ms
TZAT-0012SLOWVT: 200 ms
TZAT-0012SLOWVT: 200 ms
TZAT-0012SLOWVT: 200 ms
TZAT-0012SLOWVT: 200 ms
TZAT-0013FASTVT: 1
TZAT-0018FASTVT: NEGATIVE
TZAT-0018FASTVT: NEGATIVE
TZAT-0018FASTVT: NEGATIVE
TZAT-0018FASTVT: NEGATIVE
TZAT-0018FASTVT: NEGATIVE
TZAT-0018SLOWVT: NEGATIVE
TZAT-0018SLOWVT: NEGATIVE
TZAT-0018SLOWVT: NEGATIVE
TZAT-0018SLOWVT: NEGATIVE
TZAT-0018SLOWVT: NEGATIVE
TZAT-0018SLOWVT: NEGATIVE
TZAT-0019FASTVT: 8 V
TZAT-0019FASTVT: 8 V
TZAT-0019FASTVT: 8 V
TZAT-0019FASTVT: 8 V
TZAT-0019FASTVT: 8 V
TZAT-0019SLOWVT: 8 V
TZAT-0019SLOWVT: 8 V
TZAT-0019SLOWVT: 8 V
TZAT-0019SLOWVT: 8 V
TZAT-0019SLOWVT: 8 V
TZAT-0019SLOWVT: 8 V
TZAT-0020FASTVT: 1.6 ms
TZAT-0020FASTVT: 1.6 ms
TZAT-0020FASTVT: 1.6 ms
TZAT-0020FASTVT: 1.6 ms
TZAT-0020FASTVT: 1.6 ms
TZAT-0020SLOWVT: 1.6 ms
TZAT-0020SLOWVT: 1.6 ms
TZAT-0020SLOWVT: 1.6 ms
TZAT-0020SLOWVT: 1.6 ms
TZAT-0020SLOWVT: 1.6 ms
TZAT-0020SLOWVT: 1.6 ms
TZON-0003FASTVT: 260 ms
TZON-0003SLOWVT: 340 ms
TZON-0004SLOWVT: 32
TZON-0005SLOWVT: 12
TZON-0008FASTVT: 0 ms
TZON-0008SLOWVT: 0 ms
TZON-0011AFLUTTER: 70
TZST-0001FASTVT: 2
TZST-0003FASTVT: 35 J
VENTRICULAR PACING ICD: 0 pct
VF: 0

## 2013-01-05 NOTE — Progress Notes (Signed)
ICD check in clinic. Normal device function. Thresholds and sensing consistent with previous device measurements. Impedance trends stable over time. No evidence of any ventricular arrhythmias. No mode switches. Histogram distribution appropriate for patient and level of activity. No changes made this session. Device programmed at appropriate safety margins. Device programmed to optimize intrinsic conduction.  Patient education completed including shock plan. Alert tones/vibration demonstrated for patient.  ROV in January 2015 with the device clinic.

## 2013-01-18 ENCOUNTER — Telehealth: Payer: Self-pay | Admitting: Family Medicine

## 2013-01-18 MED ORDER — HYDROCODONE-ACETAMINOPHEN 5-325 MG PO TABS: ORAL_TABLET | ORAL | Status: DC

## 2013-01-18 NOTE — Telephone Encounter (Signed)
Dear Dema Severin Team I will leave his RX up front.  We can no longer DO refills on hydrocodone so I will leave 2 separate rx and I will  Give him enough for three times a day as needed (#90) which may last him a little longer. THANKS! [name]

## 2013-01-18 NOTE — Telephone Encounter (Signed)
Please call patient when meds are ready for pick up.

## 2013-01-18 NOTE — Telephone Encounter (Signed)
Spoke with patient and informed him of below 

## 2013-01-18 NOTE — Telephone Encounter (Signed)
Pt called and would like a refill of his hydrocodone left up front for pickup. He would also like 60 day supply instead of 30 day. If you have any questions please call  [phone]. JW

## 2013-01-26 ENCOUNTER — Encounter: Payer: Self-pay | Admitting: Family Medicine

## 2013-01-26 ENCOUNTER — Ambulatory Visit (INDEPENDENT_AMBULATORY_CARE_PROVIDER_SITE_OTHER): Payer: Medicare Other | Admitting: Family Medicine

## 2013-01-26 VITALS — BP 115/59 | HR 64 | Temp 98.0°F | Ht 66.5 in | Wt 315.0 lb

## 2013-01-26 DIAGNOSIS — E119 Type 2 diabetes mellitus without complications: Secondary | ICD-10-CM | POA: Diagnosis not present

## 2013-01-26 DIAGNOSIS — Z23 Encounter for immunization: Secondary | ICD-10-CM

## 2013-01-26 DIAGNOSIS — I5022 Chronic systolic (congestive) heart failure: Secondary | ICD-10-CM

## 2013-01-26 DIAGNOSIS — I428 Other cardiomyopathies: Secondary | ICD-10-CM | POA: Diagnosis not present

## 2013-01-26 LAB — POCT GLYCOSYLATED HEMOGLOBIN (HGB A1C): Hemoglobin A1C: 10.7

## 2013-01-26 NOTE — Patient Instructions (Signed)
Results for ANDERS, COFFEE (MRN [id number]) as of 01/26/2013 09:43  Ref. Range 02/11/2012 09:19 06/02/2012 11:01 06/02/2012 11:26 09/15/2012 08:33 09/15/2012 09:13 11/17/2012 09:13 11/17/2012 09:15  Sodium Latest Range: 135-145 mEq/L   139  139 137   Potassium Latest Range: 3.5-5.3 mEq/L   4.5  4.4 4.6   Chloride Latest Range: 96-112 mEq/L   105  104 103   CO2 Latest Range: 19-32 mEq/L   26  23 26    BUN Latest Range: 6-23 mg/dL   17  14 14    Creatinine Latest Range: 0.4-1.5 mg/dL   1.22  1.37 (H) 1.34   Calcium Latest Range: 8.4-10.5 mg/dL   9.4  9.2 9.5   Glucose Latest Range: 70-99 mg/dL   133 (H)  257 (H) 268 (H)   Alkaline Phosphatase Latest Range: 39-117 U/L     75    Albumin Latest Range: 3.5-5.2 g/dL     4.4    AST Latest Range: 0-37 U/L     40 (H)    ALT Latest Range: 0-53 U/L     38    Total Protein Latest Range: 6.0-8.3 g/dL     6.9    Total Bilirubin Latest Range: 0.3-1.2 mg/dL     0.5    Direct LDL No range found     127 (H)    Hemoglobin A1C No range found 7.3 7.6  9.0   10.1

## 2013-01-26 NOTE — Progress Notes (Signed)
  Subjective:    Patient ID: [name], male    DOB: May 01, 1960, 52 y.o.   MRN: [id number]  HPI #1. Followup diabetes mellitus. He is essentially done nothing different according to him but he lost 15 pounds. #2. Was having some wheezing so we had 20 function testing done. He was essentially normal. He is having no dyspnea at night does have some wheezing at the exerts itself. #3. Dilated cardiomyopathy with history of CHF. Currently no increased shortness of breath, no dyspnea at night. No lower extremity edema. Reports taking all his medicines well without any problems. Needs no refills.   Review of Systems No fever, sweats, chills.    Objective:   Physical Exam  Vital signs are reviewed GENERAL: Morbidly obese male no acute distress CARDIOVASCULAR: Regular rate and rhythm without murmur LUNGS: Clear to auscultation bilaterally ABDOMEN: Soft, positive bowel sounds IT extremity: No edema.      Assessment & Plan:  #1. Diabetes mellitus. We'll recheck his A1c today I congratulated him on 15 pound weight loss. I challenged him is that if he was able to lose 15 pounds without really trying, he might be able to significantly reduce his weight by adding 40 minutes of walking. He recently had to walk home from where his [place] down and walked 3 hours. During that time he had no chest pain so I know he can walk 40 minutes a day. I'll see him back in 3 months. We did give him his flu shot today.

## 2013-01-31 ENCOUNTER — Encounter: Payer: Self-pay | Admitting: Internal Medicine

## 2013-02-02 ENCOUNTER — Encounter: Payer: Self-pay | Admitting: Family Medicine

## 2013-03-02 ENCOUNTER — Telehealth: Payer: Self-pay | Admitting: Family Medicine

## 2013-03-02 ENCOUNTER — Ambulatory Visit (INDEPENDENT_AMBULATORY_CARE_PROVIDER_SITE_OTHER): Payer: Medicare Other | Admitting: Sports Medicine

## 2013-03-02 ENCOUNTER — Encounter: Payer: Self-pay | Admitting: Sports Medicine

## 2013-03-02 VITALS — BP 126/79 | HR 69 | Temp 98.2°F | Ht 62.0 in | Wt 305.0 lb

## 2013-03-02 DIAGNOSIS — E119 Type 2 diabetes mellitus without complications: Secondary | ICD-10-CM

## 2013-03-02 DIAGNOSIS — N183 Chronic kidney disease, stage 3 unspecified: Secondary | ICD-10-CM

## 2013-03-02 LAB — GLUCOSE, CAPILLARY: Glucose-Capillary: 466 mg/dL — ABNORMAL HIGH (ref 70–99)

## 2013-03-02 MED ORDER — METFORMIN HCL 1000 MG PO TABS: 1000.0000 mg | ORAL_TABLET | Freq: Two times a day (BID) | ORAL | Status: DC

## 2013-03-02 MED ORDER — GLIPIZIDE 5 MG PO TABS: 5.0000 mg | ORAL_TABLET | Freq: Two times a day (BID) | ORAL | Status: DC

## 2013-03-02 NOTE — Progress Notes (Signed)
  [name] - 52 y.o. male MRN [id number]  Date of birth: 01-27-1961  CC, HPI, Interval History & ROS  [name] is here today for a acute symptomatic hyperglycemia.  He reports a 10 day history of polydipsia and polyuria.  His brother came over and he checked his sugar today was 460.  He denies any other changes in his health.  Pt denies chest pain, dyspnea at rest or exertion, PND, lower extremity edema.   Patient denies any facial asymmetry, unilateral weakness, or dysarthria.  Pt denies hypoglycemic symptoms/episodes.     he denies any open wounds, URI like symptoms, abdominal pain, nausea or vomiting  He does have poor dentition but denies any significant swelling, pain   Pt denies any fevers, chills, or rigors.  Denies any significant orthopnea, PND  22 pound weight loss over the last 3 months, last A1c 10.7  Pertinent History & Care Coordination   History  Smoking status  . Never Smoker   Smokeless tobacco  . Never Used  No health maintenance topics applied.  Recent Labs  09/15/12 0833 11/17/12 0915 01/26/13 0932  HGBA1C 9.0 10.1 10.7     Otherwise past Medical, Surgical, Social, and Family History Reviewed per EMR Medications and Allergies reviewed and all updated if necessary. Objective Findings  VITALS: HR: 69 bpm  BP: 126/79 mmHg  TEMP: 98.2 F (36.8 C) (Oral)  RESP:    HT: 5\' 2"  (157.5 cm)  WT: 305 lb (138.347 kg)  BMI: 55.9   BP Readings from Last 3 Encounters:  03/02/13 126/79  01/26/13 115/59  11/25/12 130/85   Wt Readings from Last 3 Encounters:  03/02/13 305 lb (138.347 kg)  01/26/13 315 lb (142.883 kg)  11/25/12 327 lb (148.326 kg)     PHYSICAL EXAM: GENERAL: ADULT MORBIDLY OBESE AFRICAN American male. In no discomfort; no respiratory distress  PSYCH: alert and appropriate, good insight   HNEENT:  mildly dry mucous membranes, significant dental attrition but no focal abscess , no JVD, no cervical lymphadenopathy, normal appearing  tympanic membranes, no pharyngeal erythema or tonsillar exudate   CARDIO: RRR, S1/S2 heard, no murmur  LUNGS: CTA B, no wheezes, no crackles  ABDOMEN: +BS, soft, non-tender, protuberant, no fluid wave, no rigidity, no guarding  EXTREM:  Warm, well perfused.  Moves all 4 extremities spontaneously; no lateralization.  No noted foot lesions.  Distal pulses 2+/4.  2+/4 pretibial edema.  GU:   SKIN: No open skin lesions noted      Assessment & Plan   Problems addressed today: General Plan & Pt Instructions:  1. Diabetes mellitus       Increase your metformin.  Add glipizide twice a day  Keep taking all medications unless you hear from [place].  I am checking labs today    For further discussion of A/P and for follow up issues see problem based charting.

## 2013-03-02 NOTE — Patient Instructions (Signed)
   Increase your metformin.  Add glipizide twice a day  Keep taking all medications unless you hear from [place].  I am checking labs today   If you need anything prior to your next visit please call the clinic. Please Bring all medications or accurate medication list with you to each appointment; an accurate medication list is essential in providing you the best care possible.

## 2013-03-02 NOTE — Assessment & Plan Note (Addendum)
Titrate metformin to 1000 mg twice a day.  Add glipizide Followup in one month for next A1c, will likely be high given recent adjustments. Warning signs given for hyperglycemia, HONK

## 2013-03-02 NOTE — Assessment & Plan Note (Signed)
The patient is on Spironolactone and has had significant weight loss   I'm concerned for prerenal azotemia.  Check BMET, monitor fluids  - If elevated creatinine or potassium will have patient hold Spironolactone

## 2013-03-03 LAB — COMPREHENSIVE METABOLIC PANEL
ALT: 18 U/L (ref 0–53)
AST: 24 U/L (ref 0–37)
Albumin: 4.1 g/dL (ref 3.5–5.2)
Alkaline Phosphatase: 80 U/L (ref 39–117)
BUN: 15 mg/dL (ref 6–23)
CO2: 23 mEq/L (ref 19–32)
Calcium: 9.5 mg/dL (ref 8.4–10.5)
Chloride: 100 mEq/L (ref 96–112)
Creat: 1.41 mg/dL — ABNORMAL HIGH (ref 0.50–1.35)
Glucose, Bld: 428 mg/dL — ABNORMAL HIGH (ref 70–99)
Potassium: 4.4 mEq/L (ref 3.5–5.3)
Sodium: 134 mEq/L — ABNORMAL LOW (ref 135–145)
Total Bilirubin: 0.4 mg/dL (ref 0.3–1.2)
Total Protein: 7 g/dL (ref 6.0–8.3)

## 2013-03-08 DIAGNOSIS — H1045 Other chronic allergic conjunctivitis: Secondary | ICD-10-CM | POA: Diagnosis not present

## 2013-03-08 DIAGNOSIS — H0289 Other specified disorders of eyelid: Secondary | ICD-10-CM | POA: Diagnosis not present

## 2013-03-08 DIAGNOSIS — Z961 Presence of intraocular lens: Secondary | ICD-10-CM | POA: Diagnosis not present

## 2013-03-08 DIAGNOSIS — H4011X0 Primary open-angle glaucoma, stage unspecified: Secondary | ICD-10-CM | POA: Diagnosis not present

## 2013-03-11 ENCOUNTER — Telehealth: Payer: Self-pay | Admitting: Family Medicine

## 2013-03-11 NOTE — Telephone Encounter (Signed)
Called to [facility] [phone]) Freestyle Glucometer Strips, test CBG once a day, disp#90 with 3 refills per MD order.  [name], [name], [place]

## 2013-03-11 NOTE — Telephone Encounter (Signed)
Patient is needing a script for Freestyle Lite strips.  He is out and needs to check everyday because his sugar has been up.  He uses Paediatric nurse on [address].

## 2013-03-11 NOTE — Telephone Encounter (Signed)
Gave wife info regarding request for the Freestyle Lite test strips

## 2013-03-16 ENCOUNTER — Encounter: Payer: Self-pay | Admitting: Family Medicine

## 2013-03-16 ENCOUNTER — Ambulatory Visit (INDEPENDENT_AMBULATORY_CARE_PROVIDER_SITE_OTHER): Payer: Medicare Other | Admitting: Family Medicine

## 2013-03-16 VITALS — BP 138/81 | HR 73 | Temp 97.7°F | Ht 62.0 in | Wt 302.5 lb

## 2013-03-16 DIAGNOSIS — N183 Chronic kidney disease, stage 3 unspecified: Secondary | ICD-10-CM

## 2013-03-16 DIAGNOSIS — E1165 Type 2 diabetes mellitus with hyperglycemia: Secondary | ICD-10-CM | POA: Diagnosis not present

## 2013-03-16 LAB — BASIC METABOLIC PANEL
BUN: 9 mg/dL (ref 6–23)
CO2: 24 mEq/L (ref 19–32)
Calcium: 8.5 mg/dL (ref 8.4–10.5)
Chloride: 107 mEq/L (ref 96–112)
Creat: 1.49 mg/dL — ABNORMAL HIGH (ref 0.50–1.35)
Glucose, Bld: 203 mg/dL — ABNORMAL HIGH (ref 70–99)
Potassium: 4.4 mEq/L (ref 3.5–5.3)
Sodium: 141 mEq/L (ref 135–145)

## 2013-03-16 MED ORDER — HYDROCODONE-ACETAMINOPHEN 5-325 MG PO TABS: ORAL_TABLET | ORAL | Status: DC

## 2013-03-17 ENCOUNTER — Encounter: Payer: Self-pay | Admitting: Family Medicine

## 2013-03-22 NOTE — Progress Notes (Signed)
   Subjective:    Patient ID: [name], male    DOB: 05/12/1960, 52 y.o.   MRN: [id number]  HPI Followup diabetes mellitus after increasing metformin. He still a little bit better. Trying to follow a better diet. Not exercising. No episodes of low blood sugar.   Review of Systems No fever, sweats, chills    Objective:   Physical Exam Vital signs are reviewed GENERAL: Well-developed male no acute distress CARDIOVASCULAR: Regular rate and rhythm ABDOMEN: Soft positive bowel sounds LUNGS: Clear to auscultation       Assessment & Plan:  #1. Diabetes mellitus. Some improvement in his sugars. I would like him to continue recording medicine see me in a month. #2. Chronic renal insufficiency: We will now recheck his creatinine with his increase in metformin dose I'll see him back in month.

## 2013-03-24 DIAGNOSIS — Z961 Presence of intraocular lens: Secondary | ICD-10-CM | POA: Diagnosis not present

## 2013-03-24 DIAGNOSIS — H4011X0 Primary open-angle glaucoma, stage unspecified: Secondary | ICD-10-CM | POA: Diagnosis not present

## 2013-04-08 DIAGNOSIS — E7800 Pure hypercholesterolemia, unspecified: Secondary | ICD-10-CM | POA: Diagnosis not present

## 2013-04-08 DIAGNOSIS — E119 Type 2 diabetes mellitus without complications: Secondary | ICD-10-CM | POA: Diagnosis not present

## 2013-04-08 DIAGNOSIS — I428 Other cardiomyopathies: Secondary | ICD-10-CM | POA: Diagnosis not present

## 2013-04-08 DIAGNOSIS — I10 Essential (primary) hypertension: Secondary | ICD-10-CM | POA: Diagnosis not present

## 2013-04-13 ENCOUNTER — Encounter: Payer: Self-pay | Admitting: Family Medicine

## 2013-04-13 ENCOUNTER — Ambulatory Visit (INDEPENDENT_AMBULATORY_CARE_PROVIDER_SITE_OTHER): Payer: Medicare Other | Admitting: Family Medicine

## 2013-04-13 VITALS — BP 156/94 | HR 70 | Temp 98.0°F | Ht 62.0 in | Wt 304.2 lb

## 2013-04-13 DIAGNOSIS — E1165 Type 2 diabetes mellitus with hyperglycemia: Secondary | ICD-10-CM

## 2013-04-13 DIAGNOSIS — N183 Chronic kidney disease, stage 3 unspecified: Secondary | ICD-10-CM

## 2013-04-13 DIAGNOSIS — IMO0002 Reserved for concepts with insufficient information to code with codable children: Secondary | ICD-10-CM

## 2013-04-13 DIAGNOSIS — E118 Type 2 diabetes mellitus with unspecified complications: Secondary | ICD-10-CM

## 2013-04-13 LAB — BASIC METABOLIC PANEL
BUN: 12 mg/dL (ref 6–23)
CO2: 25 mEq/L (ref 19–32)
Calcium: 9 mg/dL (ref 8.4–10.5)
Chloride: 108 mEq/L (ref 96–112)
Creat: 1.25 mg/dL (ref 0.50–1.35)
Glucose, Bld: 125 mg/dL — ABNORMAL HIGH (ref 70–99)
Potassium: 4.5 mEq/L (ref 3.5–5.3)
Sodium: 141 mEq/L (ref 135–145)

## 2013-04-14 ENCOUNTER — Encounter: Payer: Self-pay | Admitting: Family Medicine

## 2013-04-14 NOTE — Assessment & Plan Note (Signed)
Seems like his sugars maybe a little better. I really wish he would be able to drop some weight. Given his chronic renal insufficiency on keep a very close eye on his creatinine so we'll check that today and let him know the results. I see him back in 2-3 months.

## 2013-04-14 NOTE — Progress Notes (Signed)
   Subjective:    Patient ID: [name], male    DOB: 04-11-1960, 53 y.o.   MRN: [id number]  HPI Follow diabetes mellitus. We had increased his metformin. He is maintaining his weight through the holidays. Is not exercising secondary to it being very cold outside. He is try 120s eating. No episodes of low blood sugar.   Review of Systems See history of present illness    Objective:   Physical Exam Vital signs reviewed. GENERAL: Well-developed, well-nourished, no acute distress. CARDIOVASCULAR: Regular rate and rhythm no murmur gallop or rub LUNGS: Clear to auscultation bilaterally, no rales or wheeze. ABDOMEN: Soft positive bowel sounds NEURO: No gross focal neurological deficits. MSK: Movement of extremity x 4.         Assessment & Plan:

## 2013-04-15 ENCOUNTER — Encounter: Payer: Self-pay | Admitting: Family Medicine

## 2013-04-20 ENCOUNTER — Ambulatory Visit (INDEPENDENT_AMBULATORY_CARE_PROVIDER_SITE_OTHER): Payer: Medicare Other | Admitting: *Deleted

## 2013-04-20 DIAGNOSIS — I428 Other cardiomyopathies: Secondary | ICD-10-CM

## 2013-04-20 LAB — MDC_IDC_ENUM_SESS_TYPE_INCLINIC
Battery Voltage: 2.9 V
Brady Statistic RV Percent Paced: 0 %
Date Time Interrogation Session: 20150114093833
HighPow Impedance: 38 Ohm
HighPow Impedance: 38 Ohm
HighPow Impedance: 38 Ohm
HighPow Impedance: 38 Ohm
HighPow Impedance: 38 Ohm
HighPow Impedance: 38 Ohm
HighPow Impedance: 38 Ohm
HighPow Impedance: 38 Ohm
HighPow Impedance: 38 Ohm
HighPow Impedance: 39 Ohm
HighPow Impedance: 39 Ohm
HighPow Impedance: 40 Ohm
HighPow Impedance: 40 Ohm
HighPow Impedance: 41 Ohm
HighPow Impedance: 41 Ohm
HighPow Impedance: 41 Ohm
HighPow Impedance: 48 Ohm
HighPow Impedance: 48 Ohm
HighPow Impedance: 48 Ohm
HighPow Impedance: 49 Ohm
HighPow Impedance: 49 Ohm
HighPow Impedance: 49 Ohm
HighPow Impedance: 49 Ohm
HighPow Impedance: 50 Ohm
HighPow Impedance: 50 Ohm
HighPow Impedance: 50 Ohm
HighPow Impedance: 50 Ohm
HighPow Impedance: 50 Ohm
HighPow Impedance: 51 Ohm
HighPow Impedance: 53 Ohm
HighPow Impedance: 53 Ohm
Lead Channel Impedance Value: 368 Ohm
Lead Channel Impedance Value: 368 Ohm
Lead Channel Impedance Value: 368 Ohm
Lead Channel Impedance Value: 384 Ohm
Lead Channel Impedance Value: 384 Ohm
Lead Channel Impedance Value: 392 Ohm
Lead Channel Impedance Value: 392 Ohm
Lead Channel Impedance Value: 392 Ohm
Lead Channel Impedance Value: 400 Ohm
Lead Channel Impedance Value: 400 Ohm
Lead Channel Impedance Value: 400 Ohm
Lead Channel Impedance Value: 408 Ohm
Lead Channel Impedance Value: 416 Ohm
Lead Channel Impedance Value: 424 Ohm
Lead Channel Impedance Value: 432 Ohm
Lead Channel Pacing Threshold Amplitude: 1 V
Lead Channel Pacing Threshold Pulse Width: 0.2 ms
Lead Channel Sensing Intrinsic Amplitude: 5.8 mV
Lead Channel Sensing Intrinsic Amplitude: 5.8 mV
Lead Channel Sensing Intrinsic Amplitude: 5.8 mV
Lead Channel Sensing Intrinsic Amplitude: 5.8 mV
Lead Channel Sensing Intrinsic Amplitude: 5.9 mV
Lead Channel Sensing Intrinsic Amplitude: 6 mV
Lead Channel Sensing Intrinsic Amplitude: 6 mV
Lead Channel Sensing Intrinsic Amplitude: 6 mV
Lead Channel Sensing Intrinsic Amplitude: 6.1 mV
Lead Channel Sensing Intrinsic Amplitude: 6.1 mV
Lead Channel Sensing Intrinsic Amplitude: 6.3 mV
Lead Channel Sensing Intrinsic Amplitude: 6.4 mV
Lead Channel Sensing Intrinsic Amplitude: 6.6 mV
Lead Channel Sensing Intrinsic Amplitude: 6.8 mV
Lead Channel Setting Pacing Amplitude: 2.5 V
Lead Channel Setting Pacing Pulse Width: 0.4 ms
Lead Channel Setting Sensing Sensitivity: 0.3 mV
Zone Setting Detection Interval: 260 ms
Zone Setting Detection Interval: 300 ms
Zone Setting Detection Interval: 340 ms

## 2013-04-20 NOTE — Progress Notes (Signed)
ICD check in clinic. Normal device function. Thresholds and sensing consistent with previous device measurements. Impedance trends stable over time. No evidence of any ventricular arrhythmias. Histogram distribution appropriate for patient and level of activity. No changes made this session. Device programmed at appropriate safety margins. Device programmed to optimize intrinsic conduction. Battery voltage 2.90 V. ROV 06-09-13 @ 1000 with SK.

## 2013-05-09 ENCOUNTER — Encounter: Payer: Self-pay | Admitting: Internal Medicine

## 2013-05-13 ENCOUNTER — Telehealth: Payer: Self-pay | Admitting: Family Medicine

## 2013-05-13 NOTE — Telephone Encounter (Signed)
Refill request for Hydrocodone.  

## 2013-05-16 NOTE — Telephone Encounter (Signed)
Wife called and wanted to give a new number to call when the medication is ready for pickup. jw

## 2013-06-09 ENCOUNTER — Encounter: Payer: Medicare Other | Admitting: Internal Medicine

## 2013-06-15 ENCOUNTER — Ambulatory Visit (INDEPENDENT_AMBULATORY_CARE_PROVIDER_SITE_OTHER): Payer: Medicare Other | Admitting: Family Medicine

## 2013-06-15 ENCOUNTER — Encounter: Payer: Self-pay | Admitting: Family Medicine

## 2013-06-15 VITALS — BP 120/70 | HR 66 | Temp 98.0°F | Ht 62.0 in | Wt 300.4 lb

## 2013-06-15 DIAGNOSIS — I428 Other cardiomyopathies: Secondary | ICD-10-CM

## 2013-06-15 DIAGNOSIS — IMO0002 Reserved for concepts with insufficient information to code with codable children: Secondary | ICD-10-CM

## 2013-06-15 DIAGNOSIS — H543 Unqualified visual loss, both eyes: Secondary | ICD-10-CM | POA: Insufficient documentation

## 2013-06-15 DIAGNOSIS — H547 Unspecified visual loss: Secondary | ICD-10-CM | POA: Diagnosis not present

## 2013-06-15 DIAGNOSIS — H5410 Blindness, one eye, low vision other eye, unspecified eyes: Secondary | ICD-10-CM

## 2013-06-15 DIAGNOSIS — E1165 Type 2 diabetes mellitus with hyperglycemia: Secondary | ICD-10-CM

## 2013-06-15 DIAGNOSIS — E119 Type 2 diabetes mellitus without complications: Secondary | ICD-10-CM

## 2013-06-15 DIAGNOSIS — E118 Type 2 diabetes mellitus with unspecified complications: Principal | ICD-10-CM

## 2013-06-15 HISTORY — DX: Unqualified visual loss, both eyes: H54.3

## 2013-06-15 LAB — POCT GLYCOSYLATED HEMOGLOBIN (HGB A1C): Hemoglobin A1C: 7

## 2013-06-15 MED ORDER — ISOSORBIDE MONONITRATE 20 MG PO TABS: 20.0000 mg | ORAL_TABLET | Freq: Two times a day (BID) | ORAL | Status: DC

## 2013-06-15 MED ORDER — GLIPIZIDE 5 MG PO TABS: 5.0000 mg | ORAL_TABLET | Freq: Two times a day (BID) | ORAL | Status: DC

## 2013-06-15 MED ORDER — HYDROCODONE-ACETAMINOPHEN 5-325 MG PO TABS: ORAL_TABLET | ORAL | Status: DC

## 2013-06-16 ENCOUNTER — Encounter: Payer: Self-pay | Admitting: Family Medicine

## 2013-06-16 NOTE — Assessment & Plan Note (Signed)
We discussed options and I currently don't see thing additional it

## 2013-06-16 NOTE — Assessment & Plan Note (Signed)
He's had some significant weight loss and I applauded his efforts.. Continue to followup with cardiology. Recommend continuing exercise program.

## 2013-06-16 NOTE — Progress Notes (Signed)
   Subjective:    Patient ID: [name], male    DOB: July 08, 1960, 53 y.o.   MRN: [id number]  HPI Followup diabetes mellitus. He's here today with his wife. They've been trying do much better with diet. He's been walking more regularly. He's lost some weight. No episodes of low blood sugar. #2. 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 Due to financial concerns she was unable to get the left eye cataract taking care of. #3. Hypertension with primary dilated cardiomyopathy. He continues to follow up with cardiology. They've made a few medication adjustments. No current chest pain with exertion, no lower strandy edema, no palpitations or any change in his exertional capacity.   Review of Systems No fever, sweats, chills. No episodes of low blood sugar. See history of present illness above for additional pertinent review of systems.    Objective:   Physical Exam Vital signs reviewed. GENERAL: Well-developed, well-nourished, obese no acute distress. CARDIOVASCULAR: Regular rate and rhythm no murmur gallop or rub LUNGS: Clear to auscultation bilaterally, no rales or wheeze. ABDOMEN: Soft positive bowel sounds NEURO: No gross focal neurological deficits. MSK: Movement of extremity x 4.         Assessment & Plan:

## 2013-06-16 NOTE — Assessment & Plan Note (Signed)
Much improved diabetes. Gave him positive feedback. Followup 3 months.

## 2013-06-17 ENCOUNTER — Encounter: Payer: Self-pay | Admitting: Internal Medicine

## 2013-07-06 DIAGNOSIS — E119 Type 2 diabetes mellitus without complications: Secondary | ICD-10-CM | POA: Diagnosis not present

## 2013-07-06 DIAGNOSIS — I509 Heart failure, unspecified: Secondary | ICD-10-CM | POA: Diagnosis not present

## 2013-07-06 DIAGNOSIS — I10 Essential (primary) hypertension: Secondary | ICD-10-CM | POA: Diagnosis not present

## 2013-07-06 DIAGNOSIS — I428 Other cardiomyopathies: Secondary | ICD-10-CM | POA: Diagnosis not present

## 2013-07-06 DIAGNOSIS — E7800 Pure hypercholesterolemia, unspecified: Secondary | ICD-10-CM | POA: Diagnosis not present

## 2013-07-11 ENCOUNTER — Encounter: Payer: Medicare Other | Admitting: Internal Medicine

## 2013-07-11 DIAGNOSIS — I428 Other cardiomyopathies: Secondary | ICD-10-CM

## 2013-07-11 DIAGNOSIS — Z95810 Presence of automatic (implantable) cardiac defibrillator: Secondary | ICD-10-CM

## 2013-07-11 DIAGNOSIS — I5022 Chronic systolic (congestive) heart failure: Secondary | ICD-10-CM

## 2013-07-15 ENCOUNTER — Encounter: Payer: Self-pay | Admitting: Internal Medicine

## 2013-07-15 ENCOUNTER — Ambulatory Visit (INDEPENDENT_AMBULATORY_CARE_PROVIDER_SITE_OTHER): Payer: Medicare Other | Admitting: Internal Medicine

## 2013-07-15 VITALS — BP 142/87 | HR 75 | Ht 62.0 in | Wt 309.0 lb

## 2013-07-15 DIAGNOSIS — Z95810 Presence of automatic (implantable) cardiac defibrillator: Secondary | ICD-10-CM

## 2013-07-15 DIAGNOSIS — I428 Other cardiomyopathies: Secondary | ICD-10-CM | POA: Diagnosis not present

## 2013-07-15 HISTORY — DX: Presence of automatic (implantable) cardiac defibrillator: Z95.810

## 2013-07-15 NOTE — Progress Notes (Signed)
Patient Care Team: Dickie La, MD as PCP - General   HPI  [name] is a 53 y.o. male seen in followup for an ICD implanted in the setting of nonischemic heart disease and congestive heart failure.  He is doing pretty well. He denies CP and SOB, or edema He has sleep apnea. He has not tolerated wearing the mask.  He has not seen Dr. Halford Chessman in some time.    Past Medical History  Diagnosis Date  . Severe hypertension   . Chest pain   . Cardiomyopathy     with borderline ejection fraction in this 53 year old male  . Arthritis   . Cataracts, bilateral     right  . Congestive heart failure   . Diabetes mellitus     does not check blood sugars at home  . Glaucoma     bilateral  . Hyperlipidemia   . Hypertension   . H/O TIA (transient ischemic attack) and stroke     with heart issues  . Implantable cardiac defibrillator -Medtronic 07/15/2013    Past Surgical History  Procedure Laterality Date  . Cardiac catheterization  10/24/2003    EF of 45-50%  . Cardiac defibrillator placement  2008  . Cataract extraction  2012    right  . Shoulder surgery  2012    left    Current Outpatient Prescriptions  Medication Sig Dispense Refill  . amLODipine (NORVASC) 10 MG tablet Take 10 mg by mouth daily.        Marland Kitchen aspirin 81 MG tablet Take 81 mg by mouth daily.      . brimonidine (ALPHAGAN) 0.2 % ophthalmic solution Place 1 drop into both eyes 2 (two) times daily.       . carvedilol (COREG) 25 MG tablet Take 1 tablet (25 mg total) by mouth 2 (two) times daily with a meal.      . dorzolamide (TRUSOPT) 2 % ophthalmic solution Place 1 drop into both eyes 2 (two) times daily.       Marland Kitchen EPINEPHrine (EPIPEN) 0.3 mg/0.3 mL SOAJ injection Inject 0.3 mLs (0.3 mg total) into the muscle as needed.  2 Device  1  . furosemide (LASIX) 40 MG tablet Take 1 tablet (40 mg total) by mouth daily.  30 tablet  3  . glipiZIDE (GLUCOTROL) 5 MG tablet Take 1 tablet (5 mg total) by mouth 2 (two) times  daily before a meal.  180 tablet  3  . HYDROcodone-acetaminophen (NORCO/VICODIN) 5-325 MG per tablet Take one or two tablest by mouth bid/tid prn lnee pain  180 tablet  0  . isosorbide mononitrate (ISMO,MONOKET) 20 MG tablet Take 1 tablet (20 mg total) by mouth 2 (two) times daily.  180 tablet  3  . latanoprost (XALATAN) 0.005 % ophthalmic solution Place 1 drop into both eyes at bedtime.       Marland Kitchen losartan (COZAAR) 100 MG tablet Take 100 mg by mouth daily.        . metFORMIN (GLUCOPHAGE) 1000 MG tablet Take 1 tablet (1,000 mg total) by mouth 2 (two) times daily with a meal.  60 tablet  5  . pravastatin (PRAVACHOL) 80 MG tablet Take 80 mg by mouth at bedtime.       Marland Kitchen spironolactone (ALDACTONE) 25 MG tablet Take 25 mg by mouth daily. Per Dr. Glennon Mac       . timolol (TIMOPTIC) 0.5 % ophthalmic solution Place 1 drop into both eyes 2 (two) times daily.       . [  DISCONTINUED] lisinopril (PRINIVIL,ZESTRIL) 40 MG tablet Take 40 mg by mouth 2 (two) times daily.         No current facility-administered medications for this visit.    Allergies  Allergen Reactions  . Bee Pollen Anaphylaxis, Itching and Swelling    Review of Systems negative except from HPI and PMH  Physical Exam BP 142/87  Pulse 75  Ht 5\' 2"  (1.575 m)  Wt 309 lb (140.161 kg)  BMI 56.50 kg/m2 Well developed and well nourished in no acute distress HENT normal E scleral and icterus clear Neck Supple JVP flat; carotids brisk and full Clear to ausculation Device pocket well healed; without hematoma or erythema.  There is no tethering  Regular rate and rhythm, no murmurs gallops or rub Soft with active bowel sounds No clubbing cyanosis none Edema Alert and oriented, grossly normal motor and sensory function Skin Warm and Dry    Assessment and  Plan  Nonischemic cardiomyopathy  Morbid obesity  Obstructive sleep apnea  Implantable defibrillator-Medtronic  The patient's device was interrogated.  The information was  reviewed. No changes were made in the programming.     He is euvolemic. His blood pressure is reasonably controlled. We will keep him on current medications. He continues to be disinclined to pursue therapy for his sleep apnea.  Laboratories last January at improved renal function. Is followed primarily by Dr. Ophelia Charter

## 2013-07-20 DIAGNOSIS — H4011X0 Primary open-angle glaucoma, stage unspecified: Secondary | ICD-10-CM | POA: Diagnosis not present

## 2013-07-25 ENCOUNTER — Encounter: Payer: Self-pay | Admitting: Internal Medicine

## 2013-08-12 DIAGNOSIS — H4011X0 Primary open-angle glaucoma, stage unspecified: Secondary | ICD-10-CM | POA: Diagnosis not present

## 2013-08-30 DIAGNOSIS — H4011X0 Primary open-angle glaucoma, stage unspecified: Secondary | ICD-10-CM | POA: Diagnosis not present

## 2013-09-14 ENCOUNTER — Ambulatory Visit (INDEPENDENT_AMBULATORY_CARE_PROVIDER_SITE_OTHER): Payer: Medicare Other | Admitting: Family Medicine

## 2013-09-14 ENCOUNTER — Encounter: Payer: Self-pay | Admitting: Family Medicine

## 2013-09-14 VITALS — BP 132/82 | HR 70 | Temp 98.1°F | Ht 62.0 in | Wt 310.0 lb

## 2013-09-14 DIAGNOSIS — N183 Chronic kidney disease, stage 3 unspecified: Secondary | ICD-10-CM | POA: Diagnosis not present

## 2013-09-14 DIAGNOSIS — I10 Essential (primary) hypertension: Secondary | ICD-10-CM | POA: Diagnosis not present

## 2013-09-14 DIAGNOSIS — E785 Hyperlipidemia, unspecified: Secondary | ICD-10-CM | POA: Diagnosis not present

## 2013-09-14 DIAGNOSIS — H547 Unspecified visual loss: Secondary | ICD-10-CM

## 2013-09-14 DIAGNOSIS — H5410 Blindness, one eye, low vision other eye, unspecified eyes: Secondary | ICD-10-CM

## 2013-09-14 DIAGNOSIS — E118 Type 2 diabetes mellitus with unspecified complications: Principal | ICD-10-CM

## 2013-09-14 DIAGNOSIS — I428 Other cardiomyopathies: Secondary | ICD-10-CM

## 2013-09-14 DIAGNOSIS — E1165 Type 2 diabetes mellitus with hyperglycemia: Secondary | ICD-10-CM

## 2013-09-14 DIAGNOSIS — E119 Type 2 diabetes mellitus without complications: Secondary | ICD-10-CM

## 2013-09-14 DIAGNOSIS — M1710 Unilateral primary osteoarthritis, unspecified knee: Secondary | ICD-10-CM

## 2013-09-14 DIAGNOSIS — M179 Osteoarthritis of knee, unspecified: Secondary | ICD-10-CM

## 2013-09-14 DIAGNOSIS — IMO0002 Reserved for concepts with insufficient information to code with codable children: Secondary | ICD-10-CM

## 2013-09-14 DIAGNOSIS — G4733 Obstructive sleep apnea (adult) (pediatric): Secondary | ICD-10-CM

## 2013-09-14 LAB — BASIC METABOLIC PANEL
BUN: 18 mg/dL (ref 6–23)
CO2: 18 mEq/L — ABNORMAL LOW (ref 19–32)
Calcium: 8.8 mg/dL (ref 8.4–10.5)
Chloride: 113 mEq/L — ABNORMAL HIGH (ref 96–112)
Creat: 1.42 mg/dL — ABNORMAL HIGH (ref 0.50–1.35)
Glucose, Bld: 123 mg/dL — ABNORMAL HIGH (ref 70–99)
Potassium: 4.2 mEq/L (ref 3.5–5.3)
Sodium: 141 mEq/L (ref 135–145)

## 2013-09-14 LAB — POCT GLYCOSYLATED HEMOGLOBIN (HGB A1C): Hemoglobin A1C: 6.1

## 2013-09-14 LAB — LDL CHOLESTEROL, DIRECT: Direct LDL: 98 mg/dL

## 2013-09-14 MED ORDER — HYDROCODONE-ACETAMINOPHEN 5-325 MG PO TABS: ORAL_TABLET | ORAL | Status: DC

## 2013-09-14 MED ORDER — METFORMIN HCL 1000 MG PO TABS: 1000.0000 mg | ORAL_TABLET | Freq: Two times a day (BID) | ORAL | Status: DC

## 2013-09-15 ENCOUNTER — Encounter: Payer: Self-pay | Admitting: Family Medicine

## 2013-09-15 NOTE — Assessment & Plan Note (Signed)
Discussed his visual issues. He seeing the ophthalmologist but has not been compliant with the new medication. Her skin to have a followup appointment with the ophthalmologist see if there is a different medicine he could use.

## 2013-09-15 NOTE — Assessment & Plan Note (Signed)
Appropriate control. Continue to follow kidney function electrolytes.

## 2013-09-15 NOTE — Progress Notes (Signed)
   Subjective:    Patient ID: [name], male    DOB: Aug 25, 1960, 53 y.o.   MRN: [id number]  HPI Follow diabetes mellitus. Taking his medicines regularly and not having any problems with him. His blood sugars have been well controlled with most of them less than 150. He is intermittently following a good diet. He is intermittently doing some walking for exercise but is not daily. He did see his ophthalmologist and was told he is almost totally blind in the right. They've done 3 surgeries on the side want to do a fourth but he does not really want to pursue that. #2. Glaucoma. His left eye continues to have elevated pressures and they put him on a new medicine is causing him some headaches and is not taking that. #3. Cardiovascular disease. Not doing his exercise but reports he has no unusual shortness of breath with exertion. No lower extremity edema. No chest pain. No palpitations. No weakness. #4. Knee pain. Intermittently he uses some Vicodin at night for his knee pain. The pain seems to be getting a little bit worse present more days of the week but not now. Pain is 4-6/10. He is aware that if he lost some weight this will probably improve   Review of Systems No fever, sweats, chills, weight change. See history of present illness above for additional partner review of systems    Objective:   Physical Exam  Vital signs reviewed. GENERAL: Well-developed, well-nourished, no acute distress. Obese CARDIOVASCULAR: Regular rate and rhythm no murmur gallop or rub LUNGS: Clear to auscultation bilaterally, no rales or wheeze. ABDOMEN: Soft positive bowel sounds.   NEURO: No gross focal neurological deficits. MSK: Movement of extremity x 4.        Assessment & Plan:

## 2013-09-15 NOTE — Assessment & Plan Note (Signed)
He's not been compliant with his CPAP because he does not like the way the mass feels. We discussed. Not sure what else we can do to make this a viable option for him.

## 2013-09-15 NOTE — Assessment & Plan Note (Signed)
Continue low-dose chronic pain medicine.

## 2013-09-16 ENCOUNTER — Encounter: Payer: Self-pay | Admitting: Family Medicine

## 2013-09-24 ENCOUNTER — Encounter (HOSPITAL_COMMUNITY): Payer: Self-pay | Admitting: Emergency Medicine

## 2013-09-24 ENCOUNTER — Emergency Department (INDEPENDENT_AMBULATORY_CARE_PROVIDER_SITE_OTHER)
Admission: EM | Admit: 2013-09-24 | Discharge: 2013-09-24 | Disposition: A | Discharge status: Home / Self Care | Payer: Medicare Other | Source: Home / Self Care | Attending: Family Medicine | Admitting: Family Medicine

## 2013-09-24 DIAGNOSIS — J069 Acute upper respiratory infection, unspecified: Secondary | ICD-10-CM

## 2013-09-24 LAB — POCT RAPID STREP A: Streptococcus, Group A Screen (Direct): NEGATIVE

## 2013-09-24 MED ORDER — MINOCYCLINE HCL 100 MG PO CAPS: 100.0000 mg | ORAL_CAPSULE | Freq: Two times a day (BID) | ORAL | Status: DC

## 2013-09-24 MED ORDER — IPRATROPIUM BROMIDE 0.06 % NA SOLN: 2.0000 | Freq: Four times a day (QID) | NASAL | Status: DC

## 2013-09-24 MED ORDER — METHYLPREDNISOLONE ACETATE 40 MG/ML IJ SUSP
80.0000 mg | Freq: Once | INTRAMUSCULAR | Status: AC
  Administered 2013-09-24: 80 mg via INTRAMUSCULAR

## 2013-09-24 MED ORDER — METHYLPREDNISOLONE ACETATE 80 MG/ML IJ SUSP
INTRAMUSCULAR | Status: AC
  Filled 2013-09-24: qty 1

## 2013-09-24 MED ORDER — METHYLPREDNISOLONE SODIUM SUCC 125 MG IJ SOLR
INTRAMUSCULAR | Status: AC
  Filled 2013-09-24: qty 2

## 2013-09-24 NOTE — Discharge Instructions (Signed)
Drink plenty of fluids as discussed, use medicine as prescribed, and mucinex or delsym for cough. Return or see your doctor if further problems °

## 2013-09-24 NOTE — ED Provider Notes (Signed)
CSN: [id number]     Arrival date & time 09/24/13  0912 History   First MD Initiated Contact with Patient 09/24/13 [phone]     Chief Complaint  Patient presents with  . Sore Throat   (Consider location/radiation/quality/duration/timing/severity/associated sxs/prior Treatment) Patient is a 53 y.o. male presenting with pharyngitis. The history is provided by the patient.  Sore Throat This is a new problem. The current episode started more than 2 days ago. The problem has not changed since onset.Pertinent negatives include no shortness of breath. The symptoms are aggravated by swallowing and coughing. Nothing relieves the symptoms.    Past Medical History  Diagnosis Date  . Severe hypertension   . Chest pain   . Cardiomyopathy     with borderline ejection fraction in this 52 year old male  . Arthritis   . Cataracts, bilateral     right  . Congestive heart failure   . Diabetes mellitus     does not check blood sugars at home  . Glaucoma     bilateral  . Hyperlipidemia   . Hypertension   . H/O TIA (transient ischemic attack) and stroke     with heart issues  . Implantable cardiac defibrillator -Medtronic 07/15/2013   Past Surgical History  Procedure Laterality Date  . Cardiac catheterization  10/24/2003    EF of 45-50%  . Cardiac defibrillator placement  2008  . Cataract extraction  2012    right  . Shoulder surgery  2012    left   Family History  Problem Relation Age of Onset  . Colon cancer Father   . Esophageal cancer Neg Hx   . Rectal cancer Neg Hx   . Stomach cancer Neg Hx    History  Substance Use Topics  . Smoking status: Never Smoker   . Smokeless tobacco: Never Used  . Alcohol Use: No    Review of Systems  Constitutional: Negative.   HENT: Positive for congestion, postnasal drip and sore throat.   Respiratory: Positive for cough. Negative for shortness of breath and wheezing.   Gastrointestinal: Negative.   Musculoskeletal: Negative.   Skin: Negative.      Allergies  Bee pollen  Home Medications   Prior to Admission medications   Medication Sig Start Date End Date Taking? Authorizing Noor Witte  amLODipine (NORVASC) 10 MG tablet Take 10 mg by mouth daily.     Yes Historical Staci Dack, MD  aspirin 81 MG tablet Take 81 mg by mouth daily.   Yes Historical Takao Lizer, MD  brimonidine (ALPHAGAN) 0.2 % ophthalmic solution Place 1 drop into both eyes 2 (two) times daily.  06/13/11  Yes Historical Lindsay Straka, MD  furosemide (LASIX) 40 MG tablet Take 1 tablet (40 mg total) by mouth daily. 10/19/12  Yes Dickie La, MD  glipiZIDE (GLUCOTROL) 5 MG tablet Take 1 tablet (5 mg total) by mouth 2 (two) times daily before a meal. 06/15/13  Yes Dickie La, MD  isosorbide mononitrate (ISMO,MONOKET) 20 MG tablet Take 1 tablet (20 mg total) by mouth 2 (two) times daily. 06/15/13  Yes Dickie La, MD  losartan (COZAAR) 100 MG tablet Take 100 mg by mouth daily.     Yes Historical Lawarence Meek, MD  metFORMIN (GLUCOPHAGE) 1000 MG tablet Take 1 tablet (1,000 mg total) by mouth 2 (two) times daily with a meal. 09/14/13  Yes Dickie La, MD  pravastatin (PRAVACHOL) 80 MG tablet Take 80 mg by mouth at bedtime.    Yes Historical Lazariah Savard, MD  carvedilol (  COREG) 25 MG tablet Take 1 tablet (25 mg total) by mouth 2 (two) times daily with a meal. 02/11/12   Dickie La, MD  dorzolamide (TRUSOPT) 2 % ophthalmic solution Place 1 drop into both eyes 2 (two) times daily.  06/13/11   Historical Lissette Schenk, MD  EPINEPHrine (EPIPEN) 0.3 mg/0.3 mL SOAJ injection Inject 0.3 mLs (0.3 mg total) into the muscle as needed. 11/09/12   Neta Ehlers, MD  HYDROcodone-acetaminophen (NORCO/VICODIN) 5-325 MG per tablet Take one or two tablest by mouth bid/tid prn lnee pain 09/14/13   Dickie La, MD  ipratropium (ATROVENT) 0.06 % nasal spray Place 2 sprays into the nose 4 (four) times daily. 09/24/13   Billy Fischer, MD  latanoprost (XALATAN) 0.005 % ophthalmic solution Place 1 drop into both eyes at bedtime.   06/13/11   Historical Adelard Sanon, MD  minocycline (MINOCIN,DYNACIN) 100 MG capsule Take 1 capsule (100 mg total) by mouth 2 (two) times daily. 09/24/13   Billy Fischer, MD  spironolactone (ALDACTONE) 25 MG tablet Take 25 mg by mouth daily. Per Dr. Glennon Mac     Historical Keandre Linden, MD  timolol (TIMOPTIC) 0.5 % ophthalmic solution Place 1 drop into both eyes 2 (two) times daily.  06/13/11   Historical Nihaal Friesen, MD   BP 121/77  Pulse 78  Temp(Src) 100 F (37.8 C) (Oral)  Resp 20  SpO2 95% Physical Exam  Nursing note and vitals reviewed. Constitutional: He is oriented to person, place, and time. He appears well-developed and well-nourished.  HENT:  Head: Normocephalic.  Right Ear: External ear normal.  Left Ear: External ear normal.  Mouth/Throat: Oropharynx is clear and moist.  Eyes: Conjunctivae are normal. Pupils are equal, round, and reactive to light.  Neck: Normal range of motion. Neck supple.  Cardiovascular: Regular rhythm, normal heart sounds and intact distal pulses.   Pulmonary/Chest: Effort normal and breath sounds normal.  Lymphadenopathy:    He has no cervical adenopathy.  Neurological: He is alert and oriented to person, place, and time.  Skin: Skin is warm and dry.    ED Course  Procedures (including critical care time) Labs Review Labs Reviewed  POCT RAPID STREP A ([facility] CARE ONLY)    Imaging Review No results found.   MDM   1. URI (upper respiratory infection)        Billy Fischer, MD 09/24/13 1009

## 2013-09-24 NOTE — ED Notes (Signed)
Pt c/o sore throat onset 6 days Sx include : cough, congestion, HA, fevers Denies v/n/d, SOB, wheezing Alert w/no signs of acute distress.

## 2013-09-26 ENCOUNTER — Telehealth (HOSPITAL_COMMUNITY): Payer: Self-pay | Admitting: *Deleted

## 2013-09-26 LAB — CULTURE, GROUP A STREP

## 2013-09-26 NOTE — ED Notes (Signed)
Throat culture: Strep beta hemolytic not group A.  Pt. adequately treated with Minocycline per Dr. Juventino Slovak.  I called pt. and left a message to call.  Call 1. [name] 09/26/2013

## 2013-09-28 DIAGNOSIS — H4011X0 Primary open-angle glaucoma, stage unspecified: Secondary | ICD-10-CM | POA: Diagnosis not present

## 2013-09-28 NOTE — ED Notes (Signed)
Patient returned call from [place], RN, about B strep. States he feels better, and is taking the medication as prescribed.

## 2013-10-04 ENCOUNTER — Encounter: Payer: Self-pay | Admitting: Internal Medicine

## 2013-10-28 ENCOUNTER — Ambulatory Visit (INDEPENDENT_AMBULATORY_CARE_PROVIDER_SITE_OTHER): Payer: Medicare Other | Admitting: *Deleted

## 2013-10-28 ENCOUNTER — Encounter: Payer: Self-pay | Admitting: Internal Medicine

## 2013-10-28 DIAGNOSIS — I428 Other cardiomyopathies: Secondary | ICD-10-CM | POA: Diagnosis not present

## 2013-10-28 DIAGNOSIS — I5022 Chronic systolic (congestive) heart failure: Secondary | ICD-10-CM

## 2013-10-28 LAB — MDC_IDC_ENUM_SESS_TYPE_INCLINIC
Battery Voltage: 2.78 V
Brady Statistic RV Percent Paced: 0 %
Date Time Interrogation Session: 20150724110652
HighPow Impedance: 38 Ohm
HighPow Impedance: 38 Ohm
HighPow Impedance: 38 Ohm
HighPow Impedance: 39 Ohm
HighPow Impedance: 39 Ohm
HighPow Impedance: 39 Ohm
HighPow Impedance: 39 Ohm
HighPow Impedance: 39 Ohm
HighPow Impedance: 39 Ohm
HighPow Impedance: 39 Ohm
HighPow Impedance: 40 Ohm
HighPow Impedance: 40 Ohm
HighPow Impedance: 40 Ohm
HighPow Impedance: 41 Ohm
HighPow Impedance: 42 Ohm
HighPow Impedance: 45 Ohm
HighPow Impedance: 48 Ohm
HighPow Impedance: 49 Ohm
HighPow Impedance: 49 Ohm
HighPow Impedance: 49 Ohm
HighPow Impedance: 49 Ohm
HighPow Impedance: 49 Ohm
HighPow Impedance: 49 Ohm
HighPow Impedance: 49 Ohm
HighPow Impedance: 50 Ohm
HighPow Impedance: 50 Ohm
HighPow Impedance: 50 Ohm
HighPow Impedance: 51 Ohm
HighPow Impedance: 51 Ohm
HighPow Impedance: 57 Ohm
HighPow Impedance: 63 Ohm
Lead Channel Impedance Value: 368 Ohm
Lead Channel Impedance Value: 392 Ohm
Lead Channel Impedance Value: 392 Ohm
Lead Channel Impedance Value: 392 Ohm
Lead Channel Impedance Value: 392 Ohm
Lead Channel Impedance Value: 392 Ohm
Lead Channel Impedance Value: 400 Ohm
Lead Channel Impedance Value: 400 Ohm
Lead Channel Impedance Value: 400 Ohm
Lead Channel Impedance Value: 400 Ohm
Lead Channel Impedance Value: 400 Ohm
Lead Channel Impedance Value: 408 Ohm
Lead Channel Impedance Value: 416 Ohm
Lead Channel Impedance Value: 416 Ohm
Lead Channel Impedance Value: 432 Ohm
Lead Channel Pacing Threshold Amplitude: 1 V
Lead Channel Pacing Threshold Pulse Width: 0.2 ms
Lead Channel Sensing Intrinsic Amplitude: 5.6 mV
Lead Channel Sensing Intrinsic Amplitude: 5.6 mV
Lead Channel Sensing Intrinsic Amplitude: 5.7 mV
Lead Channel Sensing Intrinsic Amplitude: 5.8 mV
Lead Channel Sensing Intrinsic Amplitude: 5.8 mV
Lead Channel Sensing Intrinsic Amplitude: 5.9 mV
Lead Channel Sensing Intrinsic Amplitude: 6.1 mV
Lead Channel Sensing Intrinsic Amplitude: 6.1 mV
Lead Channel Sensing Intrinsic Amplitude: 6.1 mV
Lead Channel Sensing Intrinsic Amplitude: 6.2 mV
Lead Channel Sensing Intrinsic Amplitude: 6.2 mV
Lead Channel Sensing Intrinsic Amplitude: 6.3 mV
Lead Channel Sensing Intrinsic Amplitude: 6.4 mV
Lead Channel Sensing Intrinsic Amplitude: 6.5 mV
Lead Channel Sensing Intrinsic Amplitude: 6.8 mV
Lead Channel Setting Pacing Amplitude: 2.5 V
Lead Channel Setting Pacing Pulse Width: 0.4 ms
Lead Channel Setting Sensing Sensitivity: 0.3 mV
Zone Setting Detection Interval: 260 ms
Zone Setting Detection Interval: 300 ms
Zone Setting Detection Interval: 340 ms

## 2013-10-28 NOTE — Progress Notes (Signed)
ICD check in clinic. Normal device function. Thresholds and sensing consistent with previous device measurements. Impedance trends stable over time. 1 nst episode lasting 6 beats. Histogram distribution appropriate for patient and level of activity. No changes made this session. Device programmed at appropriate safety margins. Device programmed to optimize intrinsic conduction. Battery voltage 2.78 V. ROV in 3 mths w/SK.

## 2013-12-02 ENCOUNTER — Encounter: Payer: Self-pay | Admitting: Internal Medicine

## 2013-12-09 DIAGNOSIS — E785 Hyperlipidemia, unspecified: Secondary | ICD-10-CM | POA: Diagnosis not present

## 2013-12-09 DIAGNOSIS — I10 Essential (primary) hypertension: Secondary | ICD-10-CM | POA: Diagnosis not present

## 2013-12-09 DIAGNOSIS — E669 Obesity, unspecified: Secondary | ICD-10-CM | POA: Diagnosis not present

## 2013-12-09 DIAGNOSIS — I428 Other cardiomyopathies: Secondary | ICD-10-CM | POA: Diagnosis not present

## 2013-12-09 DIAGNOSIS — I509 Heart failure, unspecified: Secondary | ICD-10-CM | POA: Diagnosis not present

## 2013-12-09 DIAGNOSIS — G4733 Obstructive sleep apnea (adult) (pediatric): Secondary | ICD-10-CM | POA: Diagnosis not present

## 2013-12-14 ENCOUNTER — Encounter: Payer: Self-pay | Admitting: Family Medicine

## 2013-12-14 ENCOUNTER — Ambulatory Visit (INDEPENDENT_AMBULATORY_CARE_PROVIDER_SITE_OTHER): Payer: Medicare Other | Admitting: Family Medicine

## 2013-12-14 VITALS — BP 129/79 | HR 66 | Ht 62.0 in | Wt 318.0 lb

## 2013-12-14 DIAGNOSIS — IMO0002 Reserved for concepts with insufficient information to code with codable children: Secondary | ICD-10-CM | POA: Diagnosis not present

## 2013-12-14 DIAGNOSIS — E1159 Type 2 diabetes mellitus with other circulatory complications: Secondary | ICD-10-CM | POA: Diagnosis not present

## 2013-12-14 DIAGNOSIS — E1165 Type 2 diabetes mellitus with hyperglycemia: Secondary | ICD-10-CM

## 2013-12-14 DIAGNOSIS — E118 Type 2 diabetes mellitus with unspecified complications: Secondary | ICD-10-CM

## 2013-12-14 DIAGNOSIS — M170 Bilateral primary osteoarthritis of knee: Secondary | ICD-10-CM

## 2013-12-14 DIAGNOSIS — Z23 Encounter for immunization: Secondary | ICD-10-CM

## 2013-12-14 DIAGNOSIS — M1710 Unilateral primary osteoarthritis, unspecified knee: Secondary | ICD-10-CM

## 2013-12-14 DIAGNOSIS — I10 Essential (primary) hypertension: Secondary | ICD-10-CM

## 2013-12-14 LAB — COMPREHENSIVE METABOLIC PANEL
ALT: 11 U/L (ref 0–53)
AST: 12 U/L (ref 0–37)
Albumin: 4 g/dL (ref 3.5–5.2)
Alkaline Phosphatase: 50 U/L (ref 39–117)
BUN: 17 mg/dL (ref 6–23)
CO2: 26 mEq/L (ref 19–32)
Calcium: 8.8 mg/dL (ref 8.4–10.5)
Chloride: 107 mEq/L (ref 96–112)
Creat: 1.29 mg/dL (ref 0.50–1.35)
Glucose, Bld: 124 mg/dL — ABNORMAL HIGH (ref 70–99)
Potassium: 4.4 mEq/L (ref 3.5–5.3)
Sodium: 143 mEq/L (ref 135–145)
Total Bilirubin: 0.5 mg/dL (ref 0.2–1.2)
Total Protein: 6.9 g/dL (ref 6.0–8.3)

## 2013-12-14 LAB — POCT GLYCOSYLATED HEMOGLOBIN (HGB A1C): Hemoglobin A1C: 6.5

## 2013-12-14 MED ORDER — HYDROCODONE-ACETAMINOPHEN 5-325 MG PO TABS: ORAL_TABLET | ORAL | Status: DC

## 2013-12-14 MED ORDER — GLIPIZIDE 5 MG PO TABS: 5.0000 mg | ORAL_TABLET | Freq: Two times a day (BID) | ORAL | Status: DC

## 2013-12-14 NOTE — Assessment & Plan Note (Signed)
Is using low dose of pain medicines are refilled that today.

## 2013-12-14 NOTE — Assessment & Plan Note (Signed)
Pretty good control so we will continue current medications. I really would like to try to get some more exercise and we discussed that.

## 2013-12-14 NOTE — Progress Notes (Signed)
   Subjective:    Patient ID: BARREN GOLDSON, male    DOB: [birth year], 53 y.o.   MRN: [id number]  HPI  #1. Followup diabetes mellitus. No problems with his medicines and his taking them regularly. Has not been doing any exercise, has not been checking his blood sugars as had no episodes of low blood sugar. No increased urination or thirst. #2. No chest pain or shortness of breath. He occasionally gets some lower strandy edema that resolves overnight. He recently had his pacemaker interrogated and it was on #3. Followup hypertension and cardiomyopathy. He continues to follow with his cardiologist as well on that make no medication changes. He's had no chest pain.  Review of Systems No fever, sweats, chills, chest pain, lower extremity cramping with exertion. See history of present illness above.    Objective:   Physical Exam  Vital signs reviewed. GENERAL: Well-developed, well-nourished, no acute distress. CARDIOVASCULAR: Regular rate and rhythm no murmur gallop or rub LUNGS: Clear to auscultation bilaterally, no rales or wheeze. ABDOMEN: Soft positive bowel sounds NEURO: No gross focal neurological deficits. MSK: Movement of extremity x 4.        Assessment & Plan:

## 2013-12-14 NOTE — Assessment & Plan Note (Signed)
Check A1c today. No medication changes. Followup 3 months.

## 2013-12-19 ENCOUNTER — Encounter: Payer: Self-pay | Admitting: Family Medicine

## 2014-01-31 ENCOUNTER — Encounter: Payer: Self-pay | Admitting: Internal Medicine

## 2014-01-31 ENCOUNTER — Ambulatory Visit (INDEPENDENT_AMBULATORY_CARE_PROVIDER_SITE_OTHER): Payer: Medicare Other | Admitting: Internal Medicine

## 2014-01-31 VITALS — BP 132/84 | HR 73 | Ht 62.0 in | Wt 331.6 lb

## 2014-01-31 DIAGNOSIS — I429 Cardiomyopathy, unspecified: Secondary | ICD-10-CM

## 2014-01-31 DIAGNOSIS — I428 Other cardiomyopathies: Secondary | ICD-10-CM | POA: Diagnosis not present

## 2014-01-31 LAB — MDC_IDC_ENUM_SESS_TYPE_INCLINIC
Battery Voltage: 2.74 V
Brady Statistic RV Percent Paced: 0 %
Date Time Interrogation Session: 20151027164527
HighPow Impedance: 38 Ohm
HighPow Impedance: 38 Ohm
HighPow Impedance: 39 Ohm
HighPow Impedance: 39 Ohm
HighPow Impedance: 39 Ohm
HighPow Impedance: 39 Ohm
HighPow Impedance: 39 Ohm
HighPow Impedance: 39 Ohm
HighPow Impedance: 40 Ohm
HighPow Impedance: 40 Ohm
HighPow Impedance: 40 Ohm
HighPow Impedance: 40 Ohm
HighPow Impedance: 40 Ohm
HighPow Impedance: 40 Ohm
HighPow Impedance: 41 Ohm
HighPow Impedance: 41 Ohm
HighPow Impedance: 48 Ohm
HighPow Impedance: 49 Ohm
HighPow Impedance: 49 Ohm
HighPow Impedance: 50 Ohm
HighPow Impedance: 50 Ohm
HighPow Impedance: 50 Ohm
HighPow Impedance: 50 Ohm
HighPow Impedance: 50 Ohm
HighPow Impedance: 50 Ohm
HighPow Impedance: 50 Ohm
HighPow Impedance: 50 Ohm
HighPow Impedance: 50 Ohm
HighPow Impedance: 51 Ohm
HighPow Impedance: 52 Ohm
HighPow Impedance: 53 Ohm
Lead Channel Impedance Value: 384 Ohm
Lead Channel Impedance Value: 384 Ohm
Lead Channel Impedance Value: 392 Ohm
Lead Channel Impedance Value: 392 Ohm
Lead Channel Impedance Value: 392 Ohm
Lead Channel Impedance Value: 400 Ohm
Lead Channel Impedance Value: 400 Ohm
Lead Channel Impedance Value: 408 Ohm
Lead Channel Impedance Value: 408 Ohm
Lead Channel Impedance Value: 408 Ohm
Lead Channel Impedance Value: 416 Ohm
Lead Channel Impedance Value: 416 Ohm
Lead Channel Impedance Value: 416 Ohm
Lead Channel Impedance Value: 416 Ohm
Lead Channel Impedance Value: 424 Ohm
Lead Channel Sensing Intrinsic Amplitude: 5.8 mV
Lead Channel Sensing Intrinsic Amplitude: 5.8 mV
Lead Channel Sensing Intrinsic Amplitude: 5.9 mV
Lead Channel Sensing Intrinsic Amplitude: 6 mV
Lead Channel Sensing Intrinsic Amplitude: 6 mV
Lead Channel Sensing Intrinsic Amplitude: 6.3 mV
Lead Channel Sensing Intrinsic Amplitude: 6.3 mV
Lead Channel Sensing Intrinsic Amplitude: 6.4 mV
Lead Channel Sensing Intrinsic Amplitude: 6.5 mV
Lead Channel Sensing Intrinsic Amplitude: 6.5 mV
Lead Channel Sensing Intrinsic Amplitude: 6.7 mV
Lead Channel Sensing Intrinsic Amplitude: 6.7 mV
Lead Channel Sensing Intrinsic Amplitude: 6.7 mV
Lead Channel Sensing Intrinsic Amplitude: 6.9 mV
Lead Channel Sensing Intrinsic Amplitude: 6.9 mV
Lead Channel Setting Pacing Amplitude: 2.5 V
Lead Channel Setting Pacing Pulse Width: 0.4 ms
Lead Channel Setting Sensing Sensitivity: 0.3 mV
Zone Setting Detection Interval: 260 ms
Zone Setting Detection Interval: 300 ms
Zone Setting Detection Interval: 340 ms

## 2014-01-31 NOTE — Progress Notes (Signed)
Patient Care Team: Dickie La, MD as PCP - General   HPI  [name] is a 53 y.o. male seen in followup for an ICD implanted in the setting of nonischemic heart disease and congestive heart failure. This was implanted for primary prevention. He is doing pretty well. He denies CP and SOB, or edema He has sleep apnea. He has not tolerated wearing the mask.   He saw [facility] recently. Functional status is stable.   Past Medical History  Diagnosis Date  . Severe hypertension   . Chest pain   . Cardiomyopathy     with borderline ejection fraction in this 53 year old male  . Arthritis   . Cataracts, bilateral     right  . Congestive heart failure   . Diabetes mellitus     does not check blood sugars at home  . Glaucoma     bilateral  . Hyperlipidemia   . Hypertension   . H/O TIA (transient ischemic attack) and stroke     with heart issues  . Implantable cardiac defibrillator -Medtronic 07/15/2013    Past Surgical History  Procedure Laterality Date  . Cardiac catheterization  10/24/2003    EF of 45-50%  . Cardiac defibrillator placement  2008  . Cataract extraction  2012    right  . Shoulder surgery  2012    left    Current Outpatient Prescriptions  Medication Sig Dispense Refill  . amLODipine (NORVASC) 10 MG tablet Take 10 mg by mouth daily.        Marland Kitchen aspirin 81 MG tablet Take 81 mg by mouth daily.      . brimonidine (ALPHAGAN) 0.2 % ophthalmic solution Place 1 drop into both eyes 2 (two) times daily.       . carvedilol (COREG) 25 MG tablet Take 1 tablet (25 mg total) by mouth 2 (two) times daily with a meal.      . dorzolamide (TRUSOPT) 2 % ophthalmic solution Place 1 drop into both eyes 2 (two) times daily.       Marland Kitchen EPINEPHrine (EPIPEN) 0.3 mg/0.3 mL SOAJ injection Inject 0.3 mLs (0.3 mg total) into the muscle as needed.  2 Device  1  . glipiZIDE (GLUCOTROL) 5 MG tablet Take 1 tablet (5 mg total) by mouth 2 (two) times daily before a meal.  180 tablet  3    . HYDROcodone-acetaminophen (NORCO/VICODIN) 5-325 MG per tablet Take one or two tablest by mouth bid/tid prn lnee pain  180 tablet  0  . isosorbide mononitrate (ISMO,MONOKET) 20 MG tablet Take 1 tablet (20 mg total) by mouth 2 (two) times daily.  180 tablet  3  . latanoprost (XALATAN) 0.005 % ophthalmic solution Place 1 drop into both eyes at bedtime.       Marland Kitchen losartan (COZAAR) 100 MG tablet Take 100 mg by mouth daily.        . metFORMIN (GLUCOPHAGE) 1000 MG tablet Take 1 tablet (1,000 mg total) by mouth 2 (two) times daily with a meal.  280 tablet  3  . pravastatin (PRAVACHOL) 80 MG tablet Take 80 mg by mouth at bedtime.       Marland Kitchen spironolactone (ALDACTONE) 25 MG tablet Take 25 mg by mouth daily. Per Dr. Glennon Mac       . furosemide (LASIX) 40 MG tablet Take 1 tablet (40 mg total) by mouth daily.  30 tablet  3  . ipratropium (ATROVENT) 0.06 % nasal spray Place 2 sprays  into the nose 4 (four) times daily.  15 mL  12  . minocycline (MINOCIN,DYNACIN) 100 MG capsule Take 1 capsule (100 mg total) by mouth 2 (two) times daily.  20 capsule  0  . timolol (TIMOPTIC) 0.5 % ophthalmic solution Place 1 drop into both eyes 2 (two) times daily.       . [DISCONTINUED] lisinopril (PRINIVIL,ZESTRIL) 40 MG tablet Take 40 mg by mouth 2 (two) times daily.         No current facility-administered medications for this visit.    Allergies  Allergen Reactions  . Bee Pollen Anaphylaxis, Itching and Swelling    Review of Systems negative except from HPI and PMH  Physical Exam BP 132/84  Pulse 73  Ht 5\' 2"  (1.575 m)  Wt 331 lb 9.6 oz (150.413 kg)  BMI 60.64 kg/m2 Well developed and morbidly obese in no acute distress HENT normal E scleral and icterus clear Neck Supple JVP flat; carotids brisk and full Clear to ausculation Device pocket well healed; without hematoma or erythema.  There is no tethering  Regular rate and rhythm, no murmurs gallops or rub Soft with active bowel sounds No clubbing cyanosis none  Edema Alert and oriented, grossly normal motor and sensory function Skin Warm and Dry    Assessment and  Plan  Nonischemic cardiomyopathy  Morbid obesity  Obstructive sleep apnea  Implantable defibrillator-Medtronic  The patient's device was interrogated.  The information was reviewed. No changes were made in the programming.     He is euvolemic. His blood pressure is reasonably controlled. We will keep him on current medications. He remains   disinclined to pursue therapy for his sleep apnea.  We spoke about the benefits of remote monitoring; he desires to not pursue that currently.   I've asked that he follow-up with Dr. Jerilynn Mages age regarding the addition of hydralazine to his isosorbide; perhaps it could be exchanged for his amlodipine

## 2014-01-31 NOTE — Patient Instructions (Signed)
Your physician recommends that you continue on your current medications as directed. Please refer to the Current Medication list given to you today.  Your physician recommends that you schedule a follow-up appointment in: 3 months with device clinic.  Your physician wants you to follow-up in: 1 year with Dr. Klein.  You will receive a reminder letter in the mail two months in advance. If you don't receive a letter, please call our office to schedule the follow-up appointment.   

## 2014-02-23 ENCOUNTER — Emergency Department (INDEPENDENT_AMBULATORY_CARE_PROVIDER_SITE_OTHER)
Admission: EM | Admit: 2014-02-23 | Discharge: 2014-02-23 | Disposition: A | Discharge status: Home / Self Care | Payer: Medicare Other | Source: Home / Self Care | Attending: Emergency Medicine | Admitting: Emergency Medicine

## 2014-02-23 ENCOUNTER — Telehealth: Payer: Self-pay | Admitting: Family Medicine

## 2014-02-23 ENCOUNTER — Encounter (HOSPITAL_COMMUNITY): Payer: Self-pay | Admitting: *Deleted

## 2014-02-23 ENCOUNTER — Ambulatory Visit (HOSPITAL_COMMUNITY): Payer: Medicare Other | Attending: Emergency Medicine

## 2014-02-23 DIAGNOSIS — R05 Cough: Secondary | ICD-10-CM | POA: Diagnosis present

## 2014-02-23 DIAGNOSIS — J189 Pneumonia, unspecified organism: Secondary | ICD-10-CM | POA: Diagnosis not present

## 2014-02-23 DIAGNOSIS — R509 Fever, unspecified: Secondary | ICD-10-CM | POA: Insufficient documentation

## 2014-02-23 DIAGNOSIS — R918 Other nonspecific abnormal finding of lung field: Secondary | ICD-10-CM | POA: Diagnosis not present

## 2014-02-23 DIAGNOSIS — R059 Cough, unspecified: Secondary | ICD-10-CM

## 2014-02-23 MED ORDER — LIDOCAINE HCL (PF) 1 % IJ SOLN
INTRAMUSCULAR | Status: AC
Start: 2014-02-23 — End: 2014-02-23
  Filled 2014-02-23: qty 5

## 2014-02-23 MED ORDER — CEFTRIAXONE SODIUM 1 G IJ SOLR
1.0000 g | Freq: Once | INTRAMUSCULAR | Status: AC
  Administered 2014-02-23: 1 g via INTRAMUSCULAR

## 2014-02-23 MED ORDER — CEFTRIAXONE SODIUM 1 G IJ SOLR
INTRAMUSCULAR | Status: AC
  Filled 2014-02-23: qty 10

## 2014-02-23 MED ORDER — ACETAMINOPHEN 325 MG PO TABS
ORAL_TABLET | ORAL | Status: AC
  Filled 2014-02-23: qty 3

## 2014-02-23 MED ORDER — LEVOFLOXACIN 750 MG PO TABS: 750.0000 mg | ORAL_TABLET | Freq: Every day | ORAL | Status: DC

## 2014-02-23 MED ORDER — ACETAMINOPHEN 325 MG PO TABS
975.0000 mg | ORAL_TABLET | Freq: Once | ORAL | Status: AC
  Administered 2014-02-23: 975 mg via ORAL

## 2014-02-23 NOTE — ED Notes (Signed)
C/o cough onset Monday.   Wheezing, SOB and fever later that night.  Also c/o chest tightness.

## 2014-02-23 NOTE — Telephone Encounter (Signed)
Has a question about using his inhaler and taking a liquid cough syrup. Has wheezing and a cough.

## 2014-02-23 NOTE — Telephone Encounter (Signed)
Spoke with patient's wife.  States he is wheezing and has "rattling" in his chest.  Wants to know if she can give patient some of her albuterol inhaler.  Per wife--patient does not have history of breathing problems and does not use inhaler or nebulizer.  Informed wife that patient will need to be evaluated and should not use her inhaler since it is not prescribed to him.  Wife verbalized understanding and will take patient to urgent care to be evaluated.  Burna Forts, BSN, RN-BC

## 2014-02-23 NOTE — Discharge Instructions (Signed)
Start the pill antibiotics tomorrow morning. Finish all of them even if you are feeling better. If you are not getting better or if you feel you are getting worse, go to the ER.    Pneumonia, Adult Pneumonia is an infection of the lungs. It may be caused by a germ (virus or bacteria). Some types of pneumonia can spread easily from person to person. This can happen when you cough or sneeze. HOME CARE  Only take medicine as told by your doctor.  Take your medicine (antibiotics) as told. Finish it even if you start to feel better.  Do not smoke.  You may use a vaporizer or humidifier in your room. This can help loosen thick spit (mucus).  Sleep so you are almost sitting up (semi-upright). This helps reduce coughing.  Rest. A shot (vaccine) can help prevent pneumonia. Shots are often advised for:  People over 71 years old.  Patients on chemotherapy.  People with long-term (chronic) lung problems.  People with immune system problems. GET HELP RIGHT AWAY IF:   You are getting worse.  You cannot control your cough, and you are losing sleep.  You cough up blood.  Your pain gets worse, even with medicine.  You have a fever.  Any of your problems are getting worse, not better.  You have shortness of breath or chest pain. MAKE SURE YOU:   Understand these instructions.  Will watch your condition.  Will get help right away if you are not doing well or get worse. Document Released: 09/10/2007 Document Revised: 06/16/2011 Document Reviewed: 06/14/2010 [facility] Patient Information 2015 [place], [place]. This information is not intended to replace advice given to you by your health care provider. Make sure you discuss any questions you have with your health care provider.

## 2014-02-23 NOTE — ED Provider Notes (Signed)
CSN: [id number]     Arrival date & time 02/23/14  1647 History   First MD Initiated Contact with Patient 02/23/14 1823     Chief Complaint  Patient presents with  . Fever   (Consider location/radiation/quality/duration/timing/severity/associated sxs/prior Treatment) Patient is a 53 y.o. male presenting with cough. The history is provided by the patient.  Cough Cough characteristics:  Non-productive Severity:  Moderate Onset quality:  Sudden Duration:  4 days Timing:  Intermittent Progression:  Worsening Chronicity:  New Context: sick contacts   Relieved by:  None tried Worsened by:  Activity Ineffective treatments:  None tried Associated symptoms: chills, fever, shortness of breath and wheezing   Associated symptoms: no chest pain, no ear pain, no rhinorrhea, no sinus congestion and no sore throat     Past Medical History  Diagnosis Date  . Severe hypertension   . Chest pain   . Cardiomyopathy     with borderline ejection fraction in this 53 year old male  . Arthritis   . Cataracts, bilateral     right  . Congestive heart failure   . Diabetes mellitus     does not check blood sugars at home  . Glaucoma     bilateral  . Hyperlipidemia   . Hypertension   . H/O TIA (transient ischemic attack) and stroke     with heart issues  . Implantable cardiac defibrillator -Medtronic 07/15/2013   Past Surgical History  Procedure Laterality Date  . Cardiac catheterization  10/24/2003    EF of 45-50%  . Cardiac defibrillator placement  2008  . Cataract extraction  2012    right  . Shoulder surgery  2012    left  . Knee surgery Right 2000   Family History  Problem Relation Age of Onset  . Colon cancer Father   . Esophageal cancer Neg Hx   . Rectal cancer Neg Hx   . Stomach cancer Neg Hx    History  Substance Use Topics  . Smoking status: Never Smoker   . Smokeless tobacco: Never Used  . Alcohol Use: No    Review of Systems  Constitutional: Positive for fever and  chills.  HENT: Negative for congestion, ear pain, postnasal drip, rhinorrhea and sore throat.   Respiratory: Positive for cough, shortness of breath and wheezing.   Cardiovascular: Negative for chest pain and leg swelling.    Allergies  Bee pollen  Home Medications   Prior to Admission medications   Medication Sig Start Date End Date Taking? Authorizing Provider  amLODipine (NORVASC) 10 MG tablet Take 10 mg by mouth daily.     Yes Historical Provider, MD  aspirin 81 MG tablet Take 81 mg by mouth daily.   Yes Historical Provider, MD  brimonidine (ALPHAGAN) 0.2 % ophthalmic solution Place 1 drop into both eyes 2 (two) times daily.  06/13/11  Yes Historical Provider, MD  carvedilol (COREG) 25 MG tablet Take 1 tablet (25 mg total) by mouth 2 (two) times daily with a meal. 02/11/12  Yes Dickie La, MD  dorzolamide (TRUSOPT) 2 % ophthalmic solution Place 1 drop into both eyes 2 (two) times daily.  06/13/11  Yes Historical Provider, MD  furosemide (LASIX) 40 MG tablet Take 1 tablet (40 mg total) by mouth daily. 10/19/12  Yes Dickie La, MD  glipiZIDE (GLUCOTROL) 5 MG tablet Take 1 tablet (5 mg total) by mouth 2 (two) times daily before a meal. 12/14/13  Yes Dickie La, MD  HYDROcodone-acetaminophen (NORCO/VICODIN) 5-325 MG per  tablet Take one or two tablest by mouth bid/tid prn lnee pain 12/14/13  Yes Dickie La, MD  isosorbide mononitrate (ISMO,MONOKET) 20 MG tablet Take 1 tablet (20 mg total) by mouth 2 (two) times daily. 06/15/13  Yes Dickie La, MD  latanoprost (XALATAN) 0.005 % ophthalmic solution Place 1 drop into both eyes at bedtime.  06/13/11  Yes Historical Provider, MD  losartan (COZAAR) 100 MG tablet Take 100 mg by mouth daily.     Yes Historical Provider, MD  metFORMIN (GLUCOPHAGE) 1000 MG tablet Take 1 tablet (1,000 mg total) by mouth 2 (two) times daily with a meal. 09/14/13  Yes Dickie La, MD  pravastatin (PRAVACHOL) 80 MG tablet Take 80 mg by mouth at bedtime.    Yes Historical Provider,  MD  spironolactone (ALDACTONE) 25 MG tablet Take 25 mg by mouth daily. Per Dr. Glennon Mac    Yes Historical Provider, MD  EPINEPHrine (EPIPEN) 0.3 mg/0.3 mL SOAJ injection Inject 0.3 mLs (0.3 mg total) into the muscle as needed. 11/09/12   Ernestina Patches, MD  ipratropium (ATROVENT) 0.06 % nasal spray Place 2 sprays into the nose 4 (four) times daily. 09/24/13   Billy Fischer, MD  levofloxacin (LEVAQUIN) 750 MG tablet Take 1 tablet (750 mg total) by mouth daily. 02/23/14   Carvel Getting, NP  minocycline (MINOCIN,DYNACIN) 100 MG capsule Take 1 capsule (100 mg total) by mouth 2 (two) times daily. 09/24/13   Billy Fischer, MD  timolol (TIMOPTIC) 0.5 % ophthalmic solution Place 1 drop into both eyes 2 (two) times daily.  06/13/11   Historical Provider, MD   BP 134/51 mmHg  Pulse 107  Temp(Src) 102 F (38.9 C) (Oral)  Resp 24  SpO2 95% Physical Exam  Constitutional: He appears well-developed and well-nourished. He appears ill.  Cardiovascular: Regular rhythm.  Tachycardia present.   No peripheral edema  Pulmonary/Chest: Effort normal. He has wheezes.  B upper lobe wheezing that cleared with coughing, otherwise clear.     ED Course  Procedures (including critical care time) Labs Review Labs Reviewed - No data to display  Imaging Review Dg Chest 2 View  02/23/2014   CLINICAL DATA:  Fever.  Dry cough.  EXAM: CHEST  2 VIEW  COMPARISON:  07/03/2010  FINDINGS: AICD in place.  There is of focal new area of infiltrate which appears to be in the anterior medial aspect of the right upper lobe best seen on the lateral view. This is adjacent to the minor fissure. There is prominent peribronchial thickening on the lateral view with slight haziness in the left mid lung zone without consolidation. Heart size is normal. No effusions. No osseous abnormality.  IMPRESSION: Focal infiltrate in the anterior aspect of the right upper lobe with prominent peribronchial thickening. This probably represents pneumonia.    Electronically Signed   By: Rozetta Nunnery M.D.   On: 02/23/2014 18:01     MDM   1. CAP (community acquired pneumonia)   2. Cough   3. Fever    Pt given rocephin 1g IM here in [place]. Rx levaquin 750mg  daily #7. If worsening or not improving, go to the ER.    Carvel Getting, NP 02/23/14 [phone]

## 2014-03-01 ENCOUNTER — Ambulatory Visit (INDEPENDENT_AMBULATORY_CARE_PROVIDER_SITE_OTHER): Payer: Medicare Other | Admitting: Family Medicine

## 2014-03-01 ENCOUNTER — Encounter: Payer: Self-pay | Admitting: Family Medicine

## 2014-03-01 VITALS — BP 114/57 | HR 74 | Temp 98.1°F | Ht 62.0 in | Wt 326.0 lb

## 2014-03-01 DIAGNOSIS — M170 Bilateral primary osteoarthritis of knee: Secondary | ICD-10-CM

## 2014-03-01 DIAGNOSIS — J189 Pneumonia, unspecified organism: Secondary | ICD-10-CM | POA: Diagnosis not present

## 2014-03-01 MED ORDER — HYDROCODONE-ACETAMINOPHEN 5-325 MG PO TABS: ORAL_TABLET | ORAL | Status: DC

## 2014-03-01 MED ORDER — LEVOFLOXACIN 750 MG PO TABS: 750.0000 mg | ORAL_TABLET | Freq: Every day | ORAL | Status: DC

## 2014-03-01 NOTE — Assessment & Plan Note (Signed)
I went ahead and gave him next months prescription for his pain medicine as he would like to cancel that appointment since he has been seeing today. I will see him back after the first of the year, probably in February for regular diabetes and other chronic medical issues.

## 2014-03-01 NOTE — Progress Notes (Signed)
   Subjective:    Patient ID: [name], male    DOB: 03-17-1961, 53 y.o.   MRN: [id number]  HPI Recent diagnosis of pneumonia. He is taking his antibiotics without problems. The chest tightness has resolved. He still has cough. He's not had any fever or myalgias. No sore throat. Feels like he is getting better. Energy level is almost back to baseline.   Review of Systems See history of present illness    Objective:   Physical Exam Vital signs are reviewed GENERAL: Obese male no acute distress LUNGS: Slight expiratory wheeze at the left base. Junky sounds but no rales. Cough sounds quite congested. He is moving air in all lung fields. CV: Regular rate and rhythm without murmur       Assessment & Plan:  Recently diagnosed community-acquired pneumonia and placed on 7 days of Levaquin. Given his multiple underlying comorbidities, I would like to cover him for an additional amount of time and have given him additional prescription of Levaquin. Also cautioned him that should he develop new or worsening symptoms at any point, he needs to be seen.

## 2014-03-10 DIAGNOSIS — G4733 Obstructive sleep apnea (adult) (pediatric): Secondary | ICD-10-CM | POA: Diagnosis not present

## 2014-03-10 DIAGNOSIS — E785 Hyperlipidemia, unspecified: Secondary | ICD-10-CM | POA: Diagnosis not present

## 2014-03-10 DIAGNOSIS — E119 Type 2 diabetes mellitus without complications: Secondary | ICD-10-CM | POA: Diagnosis not present

## 2014-03-10 DIAGNOSIS — I5020 Unspecified systolic (congestive) heart failure: Secondary | ICD-10-CM | POA: Diagnosis not present

## 2014-03-10 DIAGNOSIS — I420 Dilated cardiomyopathy: Secondary | ICD-10-CM | POA: Diagnosis not present

## 2014-03-10 DIAGNOSIS — I10 Essential (primary) hypertension: Secondary | ICD-10-CM | POA: Diagnosis not present

## 2014-03-10 DIAGNOSIS — E669 Obesity, unspecified: Secondary | ICD-10-CM | POA: Diagnosis not present

## 2014-05-03 ENCOUNTER — Ambulatory Visit (INDEPENDENT_AMBULATORY_CARE_PROVIDER_SITE_OTHER): Payer: Medicare Other | Admitting: *Deleted

## 2014-05-03 DIAGNOSIS — Z95810 Presence of automatic (implantable) cardiac defibrillator: Secondary | ICD-10-CM

## 2014-05-03 DIAGNOSIS — I5022 Chronic systolic (congestive) heart failure: Secondary | ICD-10-CM | POA: Diagnosis not present

## 2014-05-03 LAB — MDC_IDC_ENUM_SESS_TYPE_INCLINIC
Battery Voltage: 2.66 V
Brady Statistic RV Percent Paced: 0 %
Date Time Interrogation Session: 20160127101116
HighPow Impedance: 38 Ohm
HighPow Impedance: 38 Ohm
HighPow Impedance: 38 Ohm
HighPow Impedance: 39 Ohm
HighPow Impedance: 39 Ohm
HighPow Impedance: 39 Ohm
HighPow Impedance: 40 Ohm
HighPow Impedance: 40 Ohm
HighPow Impedance: 40 Ohm
HighPow Impedance: 40 Ohm
HighPow Impedance: 41 Ohm
HighPow Impedance: 41 Ohm
HighPow Impedance: 41 Ohm
HighPow Impedance: 42 Ohm
HighPow Impedance: 42 Ohm
HighPow Impedance: 44 Ohm
HighPow Impedance: 48 Ohm
HighPow Impedance: 49 Ohm
HighPow Impedance: 49 Ohm
HighPow Impedance: 50 Ohm
HighPow Impedance: 50 Ohm
HighPow Impedance: 50 Ohm
HighPow Impedance: 50 Ohm
HighPow Impedance: 50 Ohm
HighPow Impedance: 51 Ohm
HighPow Impedance: 51 Ohm
HighPow Impedance: 51 Ohm
HighPow Impedance: 52 Ohm
HighPow Impedance: 52 Ohm
HighPow Impedance: 52 Ohm
HighPow Impedance: 54 Ohm
Lead Channel Impedance Value: 384 Ohm
Lead Channel Impedance Value: 384 Ohm
Lead Channel Impedance Value: 400 Ohm
Lead Channel Impedance Value: 408 Ohm
Lead Channel Impedance Value: 408 Ohm
Lead Channel Impedance Value: 408 Ohm
Lead Channel Impedance Value: 408 Ohm
Lead Channel Impedance Value: 408 Ohm
Lead Channel Impedance Value: 416 Ohm
Lead Channel Impedance Value: 416 Ohm
Lead Channel Impedance Value: 416 Ohm
Lead Channel Impedance Value: 416 Ohm
Lead Channel Impedance Value: 416 Ohm
Lead Channel Impedance Value: 416 Ohm
Lead Channel Impedance Value: 424 Ohm
Lead Channel Sensing Intrinsic Amplitude: 5.9 mV
Lead Channel Sensing Intrinsic Amplitude: 6 mV
Lead Channel Sensing Intrinsic Amplitude: 6 mV
Lead Channel Sensing Intrinsic Amplitude: 6.1 mV
Lead Channel Sensing Intrinsic Amplitude: 6.1 mV
Lead Channel Sensing Intrinsic Amplitude: 6.1 mV
Lead Channel Sensing Intrinsic Amplitude: 6.3 mV
Lead Channel Sensing Intrinsic Amplitude: 6.3 mV
Lead Channel Sensing Intrinsic Amplitude: 6.8 mV
Lead Channel Sensing Intrinsic Amplitude: 6.9 mV
Lead Channel Sensing Intrinsic Amplitude: 6.9 mV
Lead Channel Sensing Intrinsic Amplitude: 7.1 mV
Lead Channel Sensing Intrinsic Amplitude: 7.1 mV
Lead Channel Sensing Intrinsic Amplitude: 7.1 mV
Lead Channel Sensing Intrinsic Amplitude: 7.3 mV
Lead Channel Setting Pacing Amplitude: 2.5 V
Lead Channel Setting Pacing Pulse Width: 0.4 ms
Lead Channel Setting Sensing Sensitivity: 0.3 mV
Zone Setting Detection Interval: 260 ms
Zone Setting Detection Interval: 300 ms
Zone Setting Detection Interval: 340 ms

## 2014-05-03 NOTE — Progress Notes (Signed)
ICD check in clinic. Normal device function. Threshold and sensing consistent with previous device measurements. Impedance trends stable over time. 2 NSVT---longest 10beats, no EGMs. Histogram distribution appropriate for patient and level of activity. No changes made this session. Device programmed at appropriate safety margins. Device programmed to optimize intrinsic conduction. Battery @2 .66V, ERI=2.62V. ROV w/ device clinic for battery only 06/05/14 (next billable late Apr/early May).

## 2014-05-17 ENCOUNTER — Encounter: Payer: Self-pay | Admitting: Family Medicine

## 2014-05-17 ENCOUNTER — Ambulatory Visit (INDEPENDENT_AMBULATORY_CARE_PROVIDER_SITE_OTHER): Payer: Medicare Other | Admitting: Family Medicine

## 2014-05-17 ENCOUNTER — Encounter: Payer: Self-pay | Admitting: Internal Medicine

## 2014-05-17 VITALS — BP 141/75 | HR 75 | Temp 98.2°F | Ht 62.0 in | Wt 334.4 lb

## 2014-05-17 DIAGNOSIS — E118 Type 2 diabetes mellitus with unspecified complications: Secondary | ICD-10-CM

## 2014-05-17 DIAGNOSIS — M170 Bilateral primary osteoarthritis of knee: Secondary | ICD-10-CM

## 2014-05-17 DIAGNOSIS — Z95810 Presence of automatic (implantable) cardiac defibrillator: Secondary | ICD-10-CM

## 2014-05-17 DIAGNOSIS — Z23 Encounter for immunization: Secondary | ICD-10-CM

## 2014-05-17 DIAGNOSIS — Z7189 Other specified counseling: Secondary | ICD-10-CM | POA: Diagnosis not present

## 2014-05-17 DIAGNOSIS — I5022 Chronic systolic (congestive) heart failure: Secondary | ICD-10-CM

## 2014-05-17 DIAGNOSIS — IMO0002 Reserved for concepts with insufficient information to code with codable children: Secondary | ICD-10-CM

## 2014-05-17 DIAGNOSIS — G8929 Other chronic pain: Secondary | ICD-10-CM

## 2014-05-17 DIAGNOSIS — E1165 Type 2 diabetes mellitus with hyperglycemia: Secondary | ICD-10-CM

## 2014-05-17 HISTORY — DX: Other chronic pain: G89.29

## 2014-05-17 LAB — BASIC METABOLIC PANEL
BUN: 14 mg/dL (ref 6–23)
CO2: 28 mEq/L (ref 19–32)
Calcium: 9.3 mg/dL (ref 8.4–10.5)
Chloride: 104 mEq/L (ref 96–112)
Creat: 1.16 mg/dL (ref 0.50–1.35)
Glucose, Bld: 141 mg/dL — ABNORMAL HIGH (ref 70–99)
Potassium: 4.6 mEq/L (ref 3.5–5.3)
Sodium: 141 mEq/L (ref 135–145)

## 2014-05-17 LAB — POCT GLYCOSYLATED HEMOGLOBIN (HGB A1C): Hemoglobin A1C: 6.8

## 2014-05-17 MED ORDER — HYDROCODONE-ACETAMINOPHEN 5-325 MG PO TABS: ORAL_TABLET | ORAL | Status: DC

## 2014-05-17 MED ORDER — FUROSEMIDE 40 MG PO TABS: ORAL_TABLET | ORAL | Status: DC

## 2014-05-17 NOTE — Assessment & Plan Note (Signed)
We'll call in a prescription of Lasix for him to use when necessary. I do not 1 him getting extremely fluid overloaded but I don't want him using this chronically if he doesn't need it given his kidney function. We'll check his creatinine today

## 2014-05-17 NOTE — Progress Notes (Signed)
   Subjective:    Patient ID: [name], male    DOB: 12-18-1960, 54 y.o.   MRN: [id number]  HPI  #1. Follow-up diabetes mellitus. Has not been exercising but is following little bit better diet with smaller portion sizes. No episodes of low blood sugar. Taking his medicines regular. #2. Hypertension with hypertensive cardiomyopathy followed by cardiology. Also has implantable defibrillator. He seeing the cardiologist monthly for further evaluation of his defibrillator as she says the battery is getting low. He's had no problems with that. He has questions about his fluid pills. We had taken him off some a while back because of his increased creatinine. Most the time he gets along fine but if he has some indiscretion with his diet he notes that he can sometimes feels little fluid overloaded and his feet will swell. He's not having any chest pains. He does have shortness of breath with exertion but that is unchanged over the last couple of years. #3. He has been using some hydrocodone for his knee pain that is working out well. Needs refill.  Review of Systems  Constitutional: Negative for fever, activity change, fatigue and unexpected weight change.  Respiratory: Positive for shortness of breath. Negative for chest tightness and wheezing.   Endocrine: Negative for polydipsia, polyphagia and polyuria.  Musculoskeletal: Positive for arthralgias.  Psychiatric/Behavioral: Negative for sleep disturbance and dysphoric mood.       Objective:   Physical Exam  Neck: Normal range of motion. Neck supple. No JVD present. No thyromegaly present.  Cardiovascular: Normal rate, regular rhythm, normal heart sounds and intact distal pulses.   No murmur heard. Pulmonary/Chest: Effort normal and breath sounds normal. No respiratory distress. He has no wheezes.  Abdominal: Soft. Bowel sounds are normal.  Musculoskeletal:  RIGHT knee pain medial and lateral joint lines. Some external changes consistent  wit synovitis (chronic). Popliteal space muldly full but soft. Calf is soft.          Assessment & Plan:

## 2014-05-17 NOTE — Assessment & Plan Note (Signed)
Today he completed his pain contract  UDS completed.Marland Kitchen

## 2014-05-17 NOTE — Assessment & Plan Note (Addendum)
Excellent A1c today. I wish he would get a little bit more serious about his weight loss. Foot exam done today

## 2014-05-17 NOTE — Assessment & Plan Note (Signed)
Followed by cardiology. Evidently his battery is close to need to be changed severe seeing him monthly.

## 2014-05-18 ENCOUNTER — Telehealth: Payer: Self-pay | Admitting: Family Medicine

## 2014-05-18 LAB — DRUG SCR UR, PAIN MGMT, REFLEX CONF
Amphetamine Screen, Ur: NEGATIVE
Barbiturate Quant, Ur: NEGATIVE
Benzodiazepines.: NEGATIVE
Cocaine Metabolites: NEGATIVE
Creatinine,U: 157.73 mg/dL
Marijuana Metabolite: NEGATIVE
Methadone: NEGATIVE
Opiates: NEGATIVE
Phencyclidine (PCP): NEGATIVE
Propoxyphene: NEGATIVE

## 2014-05-18 NOTE — Telephone Encounter (Signed)
Checking status of durable medical equipment request she sent on 2/2 and 2/9, pt is requesting back support and right knee support, just needs to know if this will be approved

## 2014-05-18 NOTE — Telephone Encounter (Signed)
Dear Dema Severin Team I am not sure who this call is from---is it the DME company? NO--this will NOT be approved THANKS! [name]

## 2014-05-19 NOTE — Telephone Encounter (Signed)
LVM for Roxanne to call me back

## 2014-05-23 NOTE — Telephone Encounter (Signed)
Spoke with Roxanne and gave below message.

## 2014-06-05 ENCOUNTER — Ambulatory Visit (INDEPENDENT_AMBULATORY_CARE_PROVIDER_SITE_OTHER): Payer: Medicare Other | Admitting: *Deleted

## 2014-06-05 DIAGNOSIS — I5022 Chronic systolic (congestive) heart failure: Secondary | ICD-10-CM

## 2014-06-05 LAB — MDC_IDC_ENUM_SESS_TYPE_INCLINIC
Battery Voltage: 2.66 V
Brady Statistic RV Percent Paced: 0 %
Date Time Interrogation Session: 20160229092155
HighPow Impedance: 38 Ohm
HighPow Impedance: 39 Ohm
HighPow Impedance: 39 Ohm
HighPow Impedance: 39 Ohm
HighPow Impedance: 40 Ohm
HighPow Impedance: 40 Ohm
HighPow Impedance: 40 Ohm
HighPow Impedance: 40 Ohm
HighPow Impedance: 40 Ohm
HighPow Impedance: 41 Ohm
HighPow Impedance: 41 Ohm
HighPow Impedance: 41 Ohm
HighPow Impedance: 41 Ohm
HighPow Impedance: 42 Ohm
HighPow Impedance: 42 Ohm
HighPow Impedance: 42 Ohm
HighPow Impedance: 49 Ohm
HighPow Impedance: 49 Ohm
HighPow Impedance: 50 Ohm
HighPow Impedance: 50 Ohm
HighPow Impedance: 51 Ohm
HighPow Impedance: 51 Ohm
HighPow Impedance: 51 Ohm
HighPow Impedance: 51 Ohm
HighPow Impedance: 51 Ohm
HighPow Impedance: 51 Ohm
HighPow Impedance: 52 Ohm
HighPow Impedance: 52 Ohm
HighPow Impedance: 52 Ohm
HighPow Impedance: 53 Ohm
HighPow Impedance: 54 Ohm
Lead Channel Impedance Value: 384 Ohm
Lead Channel Impedance Value: 392 Ohm
Lead Channel Impedance Value: 400 Ohm
Lead Channel Impedance Value: 408 Ohm
Lead Channel Impedance Value: 408 Ohm
Lead Channel Impedance Value: 408 Ohm
Lead Channel Impedance Value: 408 Ohm
Lead Channel Impedance Value: 416 Ohm
Lead Channel Impedance Value: 416 Ohm
Lead Channel Impedance Value: 416 Ohm
Lead Channel Impedance Value: 416 Ohm
Lead Channel Impedance Value: 416 Ohm
Lead Channel Impedance Value: 424 Ohm
Lead Channel Impedance Value: 424 Ohm
Lead Channel Impedance Value: 432 Ohm
Lead Channel Pacing Threshold Amplitude: 1 V
Lead Channel Pacing Threshold Pulse Width: 0.2 ms
Lead Channel Sensing Intrinsic Amplitude: 6 mV
Lead Channel Sensing Intrinsic Amplitude: 6.3 mV
Lead Channel Sensing Intrinsic Amplitude: 6.7 mV
Lead Channel Sensing Intrinsic Amplitude: 6.7 mV
Lead Channel Sensing Intrinsic Amplitude: 6.8 mV
Lead Channel Sensing Intrinsic Amplitude: 6.8 mV
Lead Channel Sensing Intrinsic Amplitude: 6.9 mV
Lead Channel Sensing Intrinsic Amplitude: 6.9 mV
Lead Channel Sensing Intrinsic Amplitude: 6.9 mV
Lead Channel Sensing Intrinsic Amplitude: 6.9 mV
Lead Channel Sensing Intrinsic Amplitude: 7 mV
Lead Channel Sensing Intrinsic Amplitude: 7 mV
Lead Channel Sensing Intrinsic Amplitude: 7.2 mV
Lead Channel Sensing Intrinsic Amplitude: 7.4 mV
Lead Channel Sensing Intrinsic Amplitude: 7.4 mV
Lead Channel Setting Pacing Amplitude: 2.5 V
Lead Channel Setting Pacing Pulse Width: 0.4 ms
Lead Channel Setting Sensing Sensitivity: 0.3 mV
Zone Setting Detection Interval: 260 ms
Zone Setting Detection Interval: 300 ms
Zone Setting Detection Interval: 340 ms

## 2014-06-05 NOTE — Progress Notes (Signed)
ICD check in clinic. Normal device function. Thresholds and sensing consistent with previous device measurements. Impedance trends stable over time. No evidence of any ventricular arrhythmias.  Histogram distribution appropriate for patient and level of activity. No changes made this session. Device programmed at appropriate safety margins. Device programmed to optimize intrinsic conduction.  Patient education completed including shock plan. Alert tones/vibration demonstrated for patient.  Battery voltage today 2.66V.  ROV in March for battery only.

## 2014-06-15 ENCOUNTER — Encounter: Payer: Self-pay | Admitting: Internal Medicine

## 2014-07-03 ENCOUNTER — Ambulatory Visit (INDEPENDENT_AMBULATORY_CARE_PROVIDER_SITE_OTHER): Payer: Medicare Other | Admitting: *Deleted

## 2014-07-03 DIAGNOSIS — Z4502 Encounter for adjustment and management of automatic implantable cardiac defibrillator: Secondary | ICD-10-CM

## 2014-07-03 LAB — MDC_IDC_ENUM_SESS_TYPE_INCLINIC
Battery Voltage: 2.65 V
Brady Statistic RV Percent Paced: 0 %
Date Time Interrogation Session: 20160328100843
HighPow Impedance: 38 Ohm
HighPow Impedance: 39 Ohm
HighPow Impedance: 39 Ohm
HighPow Impedance: 40 Ohm
HighPow Impedance: 40 Ohm
HighPow Impedance: 40 Ohm
HighPow Impedance: 40 Ohm
HighPow Impedance: 40 Ohm
HighPow Impedance: 40 Ohm
HighPow Impedance: 40 Ohm
HighPow Impedance: 41 Ohm
HighPow Impedance: 41 Ohm
HighPow Impedance: 41 Ohm
HighPow Impedance: 41 Ohm
HighPow Impedance: 42 Ohm
HighPow Impedance: 42 Ohm
HighPow Impedance: 48 Ohm
HighPow Impedance: 48 Ohm
HighPow Impedance: 49 Ohm
HighPow Impedance: 49 Ohm
HighPow Impedance: 50 Ohm
HighPow Impedance: 50 Ohm
HighPow Impedance: 50 Ohm
HighPow Impedance: 50 Ohm
HighPow Impedance: 51 Ohm
HighPow Impedance: 51 Ohm
HighPow Impedance: 51 Ohm
HighPow Impedance: 52 Ohm
HighPow Impedance: 52 Ohm
HighPow Impedance: 52 Ohm
HighPow Impedance: 54 Ohm
Lead Channel Impedance Value: 392 Ohm
Lead Channel Impedance Value: 392 Ohm
Lead Channel Impedance Value: 400 Ohm
Lead Channel Impedance Value: 408 Ohm
Lead Channel Impedance Value: 408 Ohm
Lead Channel Impedance Value: 408 Ohm
Lead Channel Impedance Value: 416 Ohm
Lead Channel Impedance Value: 416 Ohm
Lead Channel Impedance Value: 424 Ohm
Lead Channel Impedance Value: 424 Ohm
Lead Channel Impedance Value: 424 Ohm
Lead Channel Impedance Value: 432 Ohm
Lead Channel Impedance Value: 432 Ohm
Lead Channel Impedance Value: 432 Ohm
Lead Channel Impedance Value: 440 Ohm
Lead Channel Sensing Intrinsic Amplitude: 5.9 mV
Lead Channel Sensing Intrinsic Amplitude: 6.1 mV
Lead Channel Sensing Intrinsic Amplitude: 6.2 mV
Lead Channel Sensing Intrinsic Amplitude: 6.2 mV
Lead Channel Sensing Intrinsic Amplitude: 6.3 mV
Lead Channel Sensing Intrinsic Amplitude: 6.4 mV
Lead Channel Sensing Intrinsic Amplitude: 6.7 mV
Lead Channel Sensing Intrinsic Amplitude: 6.8 mV
Lead Channel Sensing Intrinsic Amplitude: 6.9 mV
Lead Channel Sensing Intrinsic Amplitude: 6.9 mV
Lead Channel Sensing Intrinsic Amplitude: 6.9 mV
Lead Channel Sensing Intrinsic Amplitude: 7.1 mV
Lead Channel Sensing Intrinsic Amplitude: 7.1 mV
Lead Channel Sensing Intrinsic Amplitude: 7.4 mV
Lead Channel Sensing Intrinsic Amplitude: 7.5 mV
Lead Channel Setting Pacing Amplitude: 2.5 V
Lead Channel Setting Pacing Pulse Width: 0.4 ms
Lead Channel Setting Sensing Sensitivity: 0.3 mV
Zone Setting Detection Interval: 260 ms
Zone Setting Detection Interval: 300 ms
Zone Setting Detection Interval: 340 ms

## 2014-07-03 NOTE — Progress Notes (Signed)
Battery check only. Battery voltage @2 .65V, ERI=2.62V. ROV w/ device clinic 08/02/14, billable.

## 2014-07-12 ENCOUNTER — Encounter: Payer: Self-pay | Admitting: Family Medicine

## 2014-07-12 ENCOUNTER — Ambulatory Visit (INDEPENDENT_AMBULATORY_CARE_PROVIDER_SITE_OTHER): Payer: Medicare Other | Admitting: Family Medicine

## 2014-07-12 VITALS — BP 139/68 | HR 76 | Temp 98.4°F | Ht 62.0 in | Wt 333.4 lb

## 2014-07-12 DIAGNOSIS — E1159 Type 2 diabetes mellitus with other circulatory complications: Secondary | ICD-10-CM | POA: Diagnosis not present

## 2014-07-12 DIAGNOSIS — E1165 Type 2 diabetes mellitus with hyperglycemia: Secondary | ICD-10-CM

## 2014-07-12 DIAGNOSIS — Z7189 Other specified counseling: Secondary | ICD-10-CM | POA: Diagnosis not present

## 2014-07-12 DIAGNOSIS — E118 Type 2 diabetes mellitus with unspecified complications: Secondary | ICD-10-CM

## 2014-07-12 DIAGNOSIS — M170 Bilateral primary osteoarthritis of knee: Secondary | ICD-10-CM

## 2014-07-12 DIAGNOSIS — G8929 Other chronic pain: Secondary | ICD-10-CM

## 2014-07-12 DIAGNOSIS — M25512 Pain in left shoulder: Secondary | ICD-10-CM | POA: Diagnosis not present

## 2014-07-12 DIAGNOSIS — IMO0002 Reserved for concepts with insufficient information to code with codable children: Secondary | ICD-10-CM

## 2014-07-12 MED ORDER — ISOSORBIDE MONONITRATE 20 MG PO TABS: 20.0000 mg | ORAL_TABLET | Freq: Two times a day (BID) | ORAL | Status: DC

## 2014-07-12 MED ORDER — HYDROCODONE-ACETAMINOPHEN 5-325 MG PO TABS: ORAL_TABLET | ORAL | Status: DC

## 2014-07-12 MED ORDER — GLIPIZIDE 5 MG PO TABS: 5.0000 mg | ORAL_TABLET | Freq: Two times a day (BID) | ORAL | Status: DC

## 2014-07-13 NOTE — Assessment & Plan Note (Signed)
His last A1c was less than 7. I do think he would benefit from exercise and weight loss however we discussed again.

## 2014-07-13 NOTE — Assessment & Plan Note (Signed)
Refill his pain medicines today and see him back in 2 months.

## 2014-07-13 NOTE — Progress Notes (Signed)
   Subjective:    Patient ID: [name], male    DOB: 13-Aug-1960, 54 y.o.   MRN: [id number]  HPI #1. Follow-up diabetes mellitus. No episodes of low blood sugar. He's taking his medicines regularly. He has not been checking his blood sugars. He's had no unusual weight change, no increase in thirst and no urinary frequency. #2. Right knee pain. He continues to use his chronic pain medicines at home with moderate relief from them. The knee is really bothering him when he tries to do any type of activity such as walking. It also aches at night. He's not had any giving way of the knee. #3. Cardiomyopathy with history of implantable defibrillator. He's not had any episodes of defibrillator discharging. He's not had any unusual chest pains or shortness of breath. Exercise tolerance is unchanged although he is not doing much to exert himself.   Review of Systems See history of present illness. Additional pertinent review of systems is negative for fever, sweats, chills. He's had no lower extremity edema. No palpitations.    Objective:   Physical Exam  Vital signs reviewed GENERALl: Well developed, well nourished, in no acute distress. Obese NECK: Supple, FROM, without lymphadenopathy.  THYROID: normal without nodularity CAROTID ARTERIES: without bruits LUNGS: clear to auscultation bilaterally. No wheezes or rales. HEART: Regular rate and rhythm, no murmurs ABDOMEN: soft with positive bowel sounds MSK: MOE x 4. Right knee has some mild crepitus but full range of motion flexion extension. Molly tender to palpation medial joint line. No effusion. No erythema.        Assessment & Plan:

## 2014-07-13 NOTE — Assessment & Plan Note (Signed)
Continued right knee pain which is maybe a little worse than this time last year. He is using low-dose when necessary 5 couldn't. I think if he would be able to lose some weight this would significantly improve his knee pain and we discussed.

## 2014-07-17 ENCOUNTER — Encounter: Payer: Self-pay | Admitting: Internal Medicine

## 2014-07-21 DIAGNOSIS — I10 Essential (primary) hypertension: Secondary | ICD-10-CM | POA: Diagnosis not present

## 2014-07-21 DIAGNOSIS — E119 Type 2 diabetes mellitus without complications: Secondary | ICD-10-CM | POA: Diagnosis not present

## 2014-07-21 DIAGNOSIS — I420 Dilated cardiomyopathy: Secondary | ICD-10-CM | POA: Diagnosis not present

## 2014-07-21 DIAGNOSIS — E669 Obesity, unspecified: Secondary | ICD-10-CM | POA: Diagnosis not present

## 2014-07-21 DIAGNOSIS — E785 Hyperlipidemia, unspecified: Secondary | ICD-10-CM | POA: Diagnosis not present

## 2014-07-21 DIAGNOSIS — I5020 Unspecified systolic (congestive) heart failure: Secondary | ICD-10-CM | POA: Diagnosis not present

## 2014-07-21 DIAGNOSIS — G4733 Obstructive sleep apnea (adult) (pediatric): Secondary | ICD-10-CM | POA: Diagnosis not present

## 2014-08-02 ENCOUNTER — Ambulatory Visit (INDEPENDENT_AMBULATORY_CARE_PROVIDER_SITE_OTHER): Payer: Medicare Other | Admitting: *Deleted

## 2014-08-02 DIAGNOSIS — Z95810 Presence of automatic (implantable) cardiac defibrillator: Secondary | ICD-10-CM

## 2014-08-02 DIAGNOSIS — I5022 Chronic systolic (congestive) heart failure: Secondary | ICD-10-CM

## 2014-08-02 LAB — MDC_IDC_ENUM_SESS_TYPE_INCLINIC
Battery Voltage: 2.65 V
Brady Statistic RV Percent Paced: 0 %
Date Time Interrogation Session: 20160427114958
HighPow Impedance: 38 Ohm
HighPow Impedance: 39 Ohm
HighPow Impedance: 39 Ohm
HighPow Impedance: 39 Ohm
HighPow Impedance: 39 Ohm
HighPow Impedance: 39 Ohm
HighPow Impedance: 40 Ohm
HighPow Impedance: 40 Ohm
HighPow Impedance: 40 Ohm
HighPow Impedance: 40 Ohm
HighPow Impedance: 40 Ohm
HighPow Impedance: 40 Ohm
HighPow Impedance: 40 Ohm
HighPow Impedance: 40 Ohm
HighPow Impedance: 41 Ohm
HighPow Impedance: 42 Ohm
HighPow Impedance: 47 Ohm
HighPow Impedance: 49 Ohm
HighPow Impedance: 49 Ohm
HighPow Impedance: 49 Ohm
HighPow Impedance: 49 Ohm
HighPow Impedance: 49 Ohm
HighPow Impedance: 49 Ohm
HighPow Impedance: 50 Ohm
HighPow Impedance: 50 Ohm
HighPow Impedance: 50 Ohm
HighPow Impedance: 51 Ohm
HighPow Impedance: 51 Ohm
HighPow Impedance: 51 Ohm
HighPow Impedance: 51 Ohm
HighPow Impedance: 52 Ohm
Lead Channel Impedance Value: 400 Ohm
Lead Channel Impedance Value: 408 Ohm
Lead Channel Impedance Value: 416 Ohm
Lead Channel Impedance Value: 416 Ohm
Lead Channel Impedance Value: 416 Ohm
Lead Channel Impedance Value: 416 Ohm
Lead Channel Impedance Value: 416 Ohm
Lead Channel Impedance Value: 424 Ohm
Lead Channel Impedance Value: 424 Ohm
Lead Channel Impedance Value: 424 Ohm
Lead Channel Impedance Value: 424 Ohm
Lead Channel Impedance Value: 424 Ohm
Lead Channel Impedance Value: 432 Ohm
Lead Channel Impedance Value: 432 Ohm
Lead Channel Impedance Value: 432 Ohm
Lead Channel Pacing Threshold Amplitude: 1 V
Lead Channel Pacing Threshold Pulse Width: 0.2 ms
Lead Channel Sensing Intrinsic Amplitude: 6.1 mV
Lead Channel Sensing Intrinsic Amplitude: 6.3 mV
Lead Channel Sensing Intrinsic Amplitude: 6.3 mV
Lead Channel Sensing Intrinsic Amplitude: 6.4 mV
Lead Channel Sensing Intrinsic Amplitude: 6.5 mV
Lead Channel Sensing Intrinsic Amplitude: 6.8 mV
Lead Channel Sensing Intrinsic Amplitude: 6.9 mV
Lead Channel Sensing Intrinsic Amplitude: 6.9 mV
Lead Channel Sensing Intrinsic Amplitude: 6.9 mV
Lead Channel Sensing Intrinsic Amplitude: 7 mV
Lead Channel Sensing Intrinsic Amplitude: 7 mV
Lead Channel Sensing Intrinsic Amplitude: 7.1 mV
Lead Channel Sensing Intrinsic Amplitude: 7.2 mV
Lead Channel Sensing Intrinsic Amplitude: 7.4 mV
Lead Channel Sensing Intrinsic Amplitude: 7.4 mV
Lead Channel Setting Pacing Amplitude: 2.5 V
Lead Channel Setting Pacing Pulse Width: 0.4 ms
Lead Channel Setting Sensing Sensitivity: 0.3 mV
Zone Setting Detection Interval: 260 ms
Zone Setting Detection Interval: 300 ms
Zone Setting Detection Interval: 340 ms

## 2014-08-02 NOTE — Progress Notes (Signed)
Battery + full check. Normal device function. Threshold and sensing consistent with previous device measurements. Impedance trends stable over time. 5 NSVT---both 5 beats. Histogram distribution appropriate for patient and level of activity. No changes made this session. Device programmed at appropriate safety margins. Device programmed to optimize intrinsic conduction. Remaining power 2.65V, ERI=2.62V. ROV w/ device clinic 09/13/14.

## 2014-08-07 ENCOUNTER — Telehealth: Payer: Self-pay | Admitting: Family Medicine

## 2014-08-07 NOTE — Telephone Encounter (Signed)
Needs refill on test strips [facility]

## 2014-08-08 ENCOUNTER — Telehealth: Payer: Self-pay | Admitting: Family Medicine

## 2014-08-08 NOTE — Telephone Encounter (Signed)
Need test strips [facility] to [facility] on [address].

## 2014-08-08 NOTE — Telephone Encounter (Signed)
Since pt has medicare part B, they need an actual script (either sent electronically or faxed) and it must have the DX code on it with how many days he test.  Pt uses freestyle lite test strips. Rogers Ditter, [name]

## 2014-08-08 NOTE — Telephone Encounter (Signed)
Dear [name] Team Please call him or his  pharmacy and rx him some test strips---see what kkind of meter he has--I do not know.   Tests once daily for type 2 DM not on insulin THANKS! [name]

## 2014-08-09 MED ORDER — BLOOD GLUCOSE TEST VI STRP: ORAL_STRIP | Status: DC

## 2014-08-09 NOTE — Telephone Encounter (Signed)
See duplicate phone note from 08/08/14.  Burna Forts, BSN, RN-BC

## 2014-09-15 ENCOUNTER — Ambulatory Visit (INDEPENDENT_AMBULATORY_CARE_PROVIDER_SITE_OTHER): Payer: Medicare Other | Admitting: *Deleted

## 2014-09-15 DIAGNOSIS — I5022 Chronic systolic (congestive) heart failure: Secondary | ICD-10-CM

## 2014-09-15 DIAGNOSIS — I428 Other cardiomyopathies: Secondary | ICD-10-CM

## 2014-09-15 DIAGNOSIS — I429 Cardiomyopathy, unspecified: Secondary | ICD-10-CM

## 2014-09-15 LAB — CUP PACEART INCLINIC DEVICE CHECK
Battery Voltage: 2.64 V
Brady Statistic RV Percent Paced: 0 %
Date Time Interrogation Session: 20160610092048
HighPow Impedance: 38 Ohm
HighPow Impedance: 40 Ohm
HighPow Impedance: 40 Ohm
HighPow Impedance: 40 Ohm
HighPow Impedance: 40 Ohm
HighPow Impedance: 40 Ohm
HighPow Impedance: 40 Ohm
HighPow Impedance: 41 Ohm
HighPow Impedance: 41 Ohm
HighPow Impedance: 41 Ohm
HighPow Impedance: 41 Ohm
HighPow Impedance: 41 Ohm
HighPow Impedance: 41 Ohm
HighPow Impedance: 42 Ohm
HighPow Impedance: 42 Ohm
HighPow Impedance: 42 Ohm
HighPow Impedance: 50 Ohm
HighPow Impedance: 50 Ohm
HighPow Impedance: 50 Ohm
HighPow Impedance: 50 Ohm
HighPow Impedance: 50 Ohm
HighPow Impedance: 50 Ohm
HighPow Impedance: 51 Ohm
HighPow Impedance: 51 Ohm
HighPow Impedance: 51 Ohm
HighPow Impedance: 51 Ohm
HighPow Impedance: 51 Ohm
HighPow Impedance: 51 Ohm
HighPow Impedance: 51 Ohm
HighPow Impedance: 52 Ohm
HighPow Impedance: 52 Ohm
Lead Channel Impedance Value: 408 Ohm
Lead Channel Impedance Value: 408 Ohm
Lead Channel Impedance Value: 408 Ohm
Lead Channel Impedance Value: 408 Ohm
Lead Channel Impedance Value: 416 Ohm
Lead Channel Impedance Value: 416 Ohm
Lead Channel Impedance Value: 416 Ohm
Lead Channel Impedance Value: 416 Ohm
Lead Channel Impedance Value: 416 Ohm
Lead Channel Impedance Value: 416 Ohm
Lead Channel Impedance Value: 424 Ohm
Lead Channel Impedance Value: 424 Ohm
Lead Channel Impedance Value: 424 Ohm
Lead Channel Impedance Value: 432 Ohm
Lead Channel Impedance Value: 432 Ohm
Lead Channel Sensing Intrinsic Amplitude: 5.9 mV
Lead Channel Sensing Intrinsic Amplitude: 6.1 mV
Lead Channel Sensing Intrinsic Amplitude: 6.2 mV
Lead Channel Sensing Intrinsic Amplitude: 6.2 mV
Lead Channel Sensing Intrinsic Amplitude: 6.3 mV
Lead Channel Sensing Intrinsic Amplitude: 6.6 mV
Lead Channel Sensing Intrinsic Amplitude: 6.6 mV
Lead Channel Sensing Intrinsic Amplitude: 6.6 mV
Lead Channel Sensing Intrinsic Amplitude: 6.7 mV
Lead Channel Sensing Intrinsic Amplitude: 6.8 mV
Lead Channel Sensing Intrinsic Amplitude: 6.9 mV
Lead Channel Sensing Intrinsic Amplitude: 6.9 mV
Lead Channel Sensing Intrinsic Amplitude: 7 mV
Lead Channel Sensing Intrinsic Amplitude: 7.1 mV
Lead Channel Sensing Intrinsic Amplitude: 7.1 mV
Lead Channel Setting Pacing Amplitude: 2.5 V
Lead Channel Setting Pacing Pulse Width: 0.4 ms
Lead Channel Setting Sensing Sensitivity: 0.3 mV
Zone Setting Detection Interval: 260 ms
Zone Setting Detection Interval: 300 ms
Zone Setting Detection Interval: 340 ms

## 2014-09-15 NOTE — Progress Notes (Signed)
ICD check in clinic for battery only (N/C). Batt voltage 2.64V (ERI 2.62V). No venticular arrhythmias recorded. Alert tones demonstrated for patient. No changes made this session. Patient to follow up in the device clinic on 7/11 @ 0900 (batt only/next billable 11-2014).

## 2014-09-19 ENCOUNTER — Encounter: Payer: Self-pay | Admitting: Internal Medicine

## 2014-09-20 ENCOUNTER — Ambulatory Visit (INDEPENDENT_AMBULATORY_CARE_PROVIDER_SITE_OTHER): Payer: Medicare Other | Admitting: Family Medicine

## 2014-09-20 ENCOUNTER — Encounter: Payer: Self-pay | Admitting: Family Medicine

## 2014-09-20 VITALS — BP 110/55 | HR 72 | Temp 97.8°F | Ht 62.0 in | Wt 329.2 lb

## 2014-09-20 DIAGNOSIS — R0600 Dyspnea, unspecified: Secondary | ICD-10-CM

## 2014-09-20 DIAGNOSIS — I10 Essential (primary) hypertension: Secondary | ICD-10-CM

## 2014-09-20 DIAGNOSIS — E118 Type 2 diabetes mellitus with unspecified complications: Secondary | ICD-10-CM

## 2014-09-20 DIAGNOSIS — G8929 Other chronic pain: Secondary | ICD-10-CM

## 2014-09-20 DIAGNOSIS — E1165 Type 2 diabetes mellitus with hyperglycemia: Secondary | ICD-10-CM

## 2014-09-20 DIAGNOSIS — IMO0002 Reserved for concepts with insufficient information to code with codable children: Secondary | ICD-10-CM

## 2014-09-20 DIAGNOSIS — Z7189 Other specified counseling: Secondary | ICD-10-CM

## 2014-09-20 DIAGNOSIS — R0609 Other forms of dyspnea: Secondary | ICD-10-CM

## 2014-09-20 LAB — POCT GLYCOSYLATED HEMOGLOBIN (HGB A1C): Hemoglobin A1C: 7.4

## 2014-09-20 MED ORDER — EPINEPHRINE 0.3 MG/0.3ML IJ SOAJ: 0.3000 mg | INTRAMUSCULAR | Status: DC | PRN

## 2014-09-20 MED ORDER — HYDROCODONE-ACETAMINOPHEN 5-325 MG PO TABS: ORAL_TABLET | ORAL | Status: DC

## 2014-09-20 NOTE — Assessment & Plan Note (Signed)
Refilled his hydrocodone for his knee pain.

## 2014-09-20 NOTE — Assessment & Plan Note (Signed)
Really think this is most likely related to his morbid obesity and cardiomyopathy. I didn't really hear any wheezing today. I will get him set up for pulmonary function testing to rule this out.

## 2014-09-20 NOTE — Assessment & Plan Note (Signed)
Blood pressure seems controlled. I wish she would do a little bit more about exercise and weight loss.

## 2014-09-20 NOTE — Progress Notes (Signed)
   Subjective:    Patient ID: [name], male    DOB: 06-Mar-1961, 54 y.o.   MRN: [id number]  HPI  #1.RESP:  Having some more shortness of breath with exertion. He is here with his wife in she says when there walking, she can hear him wheezing. She's not sure if it's fluid on his lungs or if he is having some asthma. His son has asthma but he's never been diagnosed with that. He says it is not fluid because his arms are not swelling. He also thinks this is his baseline shortness of breath. Does not have any problems at rest. Is not having any chest pain or dizziness with exertion or at rest. #2. MSK: Continues to have a lot of left knee pain with walking or standing. #3. Cardiovascular: Taking his blood pressure medicines regularly. Does not currently need any refills. Not having any problems with his medications. #4. Has decided he is going to start checking his blood sugars at home as we had discussed ways having a lot of trouble getting a refill on his test strips. Has questions about that.  Review of Systems See history of present illness. He does not think his weight has changed significantly since I saw him last. He does not weigh regularly at home. He's had no episodes of low blood sugar, no headaches, no dizziness.    Objective:   Physical Exam  Vital signs reviewed. GENERAL: Well-developed, well-nourished, no acute distress. Morbid obesity truncal distribution CARDIOVASCULAR: Regular rate and rhythm no murmur gallop or rub LUNGS: Clear to auscultation bilaterally, no rales or wheeze. His lung sounds are little bit decreased throughout, most likely secondary to his habitus. He does have some upper airway noise that is noticeable if he tries to breeze to forcefully. ABDOMEN: Soft positive bowel sounds MSK: Movement of extremity x 4. No edema noted in the ankles.        Assessment & Plan:

## 2014-09-20 NOTE — Patient Instructions (Signed)
Please make an appointment in the pharmacy clinic to see Dr.Koval so he can do some pulmonary function testing on you. I will see back in 2 months so we can refill your pain medicine then. I have called in a new prescription for the EpiPen. I will also send in another prescription for your test strips to [facility]. Great to see you.

## 2014-09-29 ENCOUNTER — Ambulatory Visit (INDEPENDENT_AMBULATORY_CARE_PROVIDER_SITE_OTHER): Payer: Medicare Other | Admitting: Pharmacist

## 2014-09-29 ENCOUNTER — Encounter: Payer: Self-pay | Admitting: Pharmacist

## 2014-09-29 VITALS — BP 103/59 | HR 78 | Ht 62.0 in | Wt 331.4 lb

## 2014-09-29 DIAGNOSIS — R0600 Dyspnea, unspecified: Secondary | ICD-10-CM

## 2014-09-29 DIAGNOSIS — R0609 Other forms of dyspnea: Secondary | ICD-10-CM | POA: Diagnosis not present

## 2014-09-29 NOTE — Assessment & Plan Note (Signed)
Dyspnea on exertion: Spirometry evaluation reveals normal lung function. Patient reports that is a good day for his breathing and it is possible that he has asthma but no current exacerbation. Patient has been experiencing wheezing at night and on exertion and is not currently taking any medications for asthma. Reviewed results of pulmonary function tests. Patient provided with and educated on use of peak flow meter. Patient demonstrated proper technique with a personal best of 430. Patient verbalized understanding of results and education. Patient will share results from peak flow meter use with Dr. Nori Riis, especially any readings <400.  Written pt instructions provided.  F/U Clinic visit with Dr. Nori Riis. Total time in face to face counseling 30 minutes.  Patient seen with Nilsa Nutting, PharmD Candidate and Nicoletta Ba, PharmD, BCPS resident.

## 2014-09-29 NOTE — Progress Notes (Signed)
S:    Patient arrives ambulating without assistance accompanied by his wife.    Presents for lung function evaluation.  Patient reports wheezing at night and with exertion. He denies waking up short of breath.   Patient denies taking any medications for his "breathing".  O: See "scanned report" or Documentation Flowsheet (discrete results - PFTs) for  Spirometry results. Patient provided good effort while attempting spirometry.  Peak Flow 430  A/P: Dyspnea on exertion: Spirometry evaluation reveals normal lung function. Patient reports that is a good day for his breathing and it is possible that he has asthma but no current exacerbation. Patient has been experiencing wheezing at night and on exertion and is not currently taking any medications for asthma. Reviewed results of pulmonary function tests. Patient provided with and educated on use of peak flow meter. Patient demonstrated proper technique with a personal best of 430. Patient verbalized understanding of results and education. Patient will share results from peak flow meter use with Dr. Nori Riis, especially any readings <400.  Written pt instructions provided.  F/U Clinic visit with Dr. Nori Riis. Total time in face to face counseling 30 minutes.  Patient seen with Nilsa Nutting, PharmD Candidate and Nicoletta Ba, PharmD, BCPS resident.

## 2014-09-29 NOTE — Patient Instructions (Signed)
It was great to meet you today!  Your lung function tests were normal. This is great news.  Use the peak flow meter when you don't feel your breathing is good.  Tell Dr. Nori Riis what numbers you see at your next visit.

## 2014-09-29 NOTE — Progress Notes (Signed)
Patient ID: [name], male   DOB: 28-Jun-1960, 54 y.o.   MRN: [id number] Reviewed: Agree with Dr. Graylin Shiver documentation and management.

## 2014-10-03 ENCOUNTER — Encounter: Payer: Self-pay | Admitting: Internal Medicine

## 2014-10-16 ENCOUNTER — Ambulatory Visit (INDEPENDENT_AMBULATORY_CARE_PROVIDER_SITE_OTHER): Payer: Medicare Other | Admitting: *Deleted

## 2014-10-16 DIAGNOSIS — Z4502 Encounter for adjustment and management of automatic implantable cardiac defibrillator: Secondary | ICD-10-CM

## 2014-10-16 LAB — CUP PACEART INCLINIC DEVICE CHECK
Battery Voltage: 2.64 V
Brady Statistic RV Percent Paced: 0 %
Date Time Interrogation Session: 20160711091107
HighPow Impedance: 38 Ohm
HighPow Impedance: 38 Ohm
HighPow Impedance: 39 Ohm
HighPow Impedance: 39 Ohm
HighPow Impedance: 40 Ohm
HighPow Impedance: 40 Ohm
HighPow Impedance: 40 Ohm
HighPow Impedance: 40 Ohm
HighPow Impedance: 40 Ohm
HighPow Impedance: 40 Ohm
HighPow Impedance: 40 Ohm
HighPow Impedance: 40 Ohm
HighPow Impedance: 41 Ohm
HighPow Impedance: 41 Ohm
HighPow Impedance: 41 Ohm
HighPow Impedance: 41 Ohm
HighPow Impedance: 49 Ohm
HighPow Impedance: 49 Ohm
HighPow Impedance: 50 Ohm
HighPow Impedance: 50 Ohm
HighPow Impedance: 50 Ohm
HighPow Impedance: 50 Ohm
HighPow Impedance: 50 Ohm
HighPow Impedance: 50 Ohm
HighPow Impedance: 51 Ohm
HighPow Impedance: 51 Ohm
HighPow Impedance: 51 Ohm
HighPow Impedance: 51 Ohm
HighPow Impedance: 51 Ohm
HighPow Impedance: 52 Ohm
HighPow Impedance: 52 Ohm
Lead Channel Impedance Value: 384 Ohm
Lead Channel Impedance Value: 392 Ohm
Lead Channel Impedance Value: 400 Ohm
Lead Channel Impedance Value: 400 Ohm
Lead Channel Impedance Value: 400 Ohm
Lead Channel Impedance Value: 408 Ohm
Lead Channel Impedance Value: 408 Ohm
Lead Channel Impedance Value: 408 Ohm
Lead Channel Impedance Value: 416 Ohm
Lead Channel Impedance Value: 416 Ohm
Lead Channel Impedance Value: 424 Ohm
Lead Channel Impedance Value: 424 Ohm
Lead Channel Impedance Value: 432 Ohm
Lead Channel Impedance Value: 432 Ohm
Lead Channel Impedance Value: 432 Ohm
Lead Channel Sensing Intrinsic Amplitude: 6 mV
Lead Channel Sensing Intrinsic Amplitude: 6.2 mV
Lead Channel Sensing Intrinsic Amplitude: 6.3 mV
Lead Channel Sensing Intrinsic Amplitude: 6.5 mV
Lead Channel Sensing Intrinsic Amplitude: 6.5 mV
Lead Channel Sensing Intrinsic Amplitude: 6.6 mV
Lead Channel Sensing Intrinsic Amplitude: 6.6 mV
Lead Channel Sensing Intrinsic Amplitude: 6.6 mV
Lead Channel Sensing Intrinsic Amplitude: 6.6 mV
Lead Channel Sensing Intrinsic Amplitude: 6.9 mV
Lead Channel Sensing Intrinsic Amplitude: 6.9 mV
Lead Channel Sensing Intrinsic Amplitude: 6.9 mV
Lead Channel Sensing Intrinsic Amplitude: 6.9 mV
Lead Channel Sensing Intrinsic Amplitude: 6.9 mV
Lead Channel Sensing Intrinsic Amplitude: 7.2 mV
Lead Channel Setting Pacing Amplitude: 2.5 V
Lead Channel Setting Pacing Pulse Width: 0.4 ms
Lead Channel Setting Sensing Sensitivity: 0.3 mV
Zone Setting Detection Interval: 260 ms
Zone Setting Detection Interval: 300 ms
Zone Setting Detection Interval: 340 ms

## 2014-10-16 NOTE — Progress Notes (Signed)
ICD check in clinic for battery only. Battery: 2.64V (RRT 2.62V). ROV with device clinic 11/20/14 at 9:30am. ROV with SK in October.

## 2014-10-25 ENCOUNTER — Telehealth: Payer: Self-pay | Admitting: Family Medicine

## 2014-10-25 MED ORDER — METFORMIN HCL 1000 MG PO TABS: 1000.0000 mg | ORAL_TABLET | Freq: Two times a day (BID) | ORAL | Status: DC

## 2014-10-25 NOTE — Telephone Encounter (Signed)
Pt called and needs a refill on his Metformin. [name]

## 2014-11-13 ENCOUNTER — Encounter: Payer: Self-pay | Admitting: Internal Medicine

## 2014-11-15 ENCOUNTER — Encounter: Payer: Self-pay | Admitting: Family Medicine

## 2014-11-15 ENCOUNTER — Ambulatory Visit (INDEPENDENT_AMBULATORY_CARE_PROVIDER_SITE_OTHER): Payer: Medicare Other | Admitting: Family Medicine

## 2014-11-15 VITALS — BP 114/64 | HR 65 | Temp 98.2°F | Ht 62.0 in | Wt 326.0 lb

## 2014-11-15 DIAGNOSIS — G8929 Other chronic pain: Secondary | ICD-10-CM | POA: Diagnosis not present

## 2014-11-15 DIAGNOSIS — M25569 Pain in unspecified knee: Secondary | ICD-10-CM

## 2014-11-15 DIAGNOSIS — N183 Chronic kidney disease, stage 3 unspecified: Secondary | ICD-10-CM

## 2014-11-15 DIAGNOSIS — E118 Type 2 diabetes mellitus with unspecified complications: Secondary | ICD-10-CM | POA: Diagnosis not present

## 2014-11-15 DIAGNOSIS — G4733 Obstructive sleep apnea (adult) (pediatric): Secondary | ICD-10-CM

## 2014-11-15 DIAGNOSIS — M25561 Pain in right knee: Secondary | ICD-10-CM | POA: Diagnosis not present

## 2014-11-15 DIAGNOSIS — M170 Bilateral primary osteoarthritis of knee: Secondary | ICD-10-CM

## 2014-11-15 DIAGNOSIS — IMO0002 Reserved for concepts with insufficient information to code with codable children: Secondary | ICD-10-CM

## 2014-11-15 DIAGNOSIS — I5022 Chronic systolic (congestive) heart failure: Secondary | ICD-10-CM | POA: Diagnosis not present

## 2014-11-15 DIAGNOSIS — Z7189 Other specified counseling: Secondary | ICD-10-CM

## 2014-11-15 DIAGNOSIS — E1165 Type 2 diabetes mellitus with hyperglycemia: Secondary | ICD-10-CM

## 2014-11-15 LAB — LDL CHOLESTEROL, DIRECT: Direct LDL: 93 mg/dL (ref <130)

## 2014-11-15 LAB — COMPREHENSIVE METABOLIC PANEL
ALT: 26 U/L (ref 9–46)
AST: 20 U/L (ref 10–35)
Albumin: 3.7 g/dL (ref 3.6–5.1)
Alkaline Phosphatase: 51 U/L (ref 40–115)
BUN: 11 mg/dL (ref 7–25)
CO2: 23 mmol/L (ref 20–31)
Calcium: 9 mg/dL (ref 8.6–10.3)
Chloride: 107 mmol/L (ref 98–110)
Creat: 1.31 mg/dL (ref 0.70–1.33)
Glucose, Bld: 167 mg/dL — ABNORMAL HIGH (ref 65–99)
Potassium: 4.5 mmol/L (ref 3.5–5.3)
Sodium: 141 mmol/L (ref 135–146)
Total Bilirubin: 0.4 mg/dL (ref 0.2–1.2)
Total Protein: 6.4 g/dL (ref 6.1–8.1)

## 2014-11-15 MED ORDER — HYDROCODONE-ACETAMINOPHEN 5-325 MG PO TABS: ORAL_TABLET | ORAL | Status: DC

## 2014-11-15 NOTE — Assessment & Plan Note (Signed)
Recheck creatinine today.

## 2014-11-15 NOTE — Progress Notes (Signed)
   Subjective:    Patient ID: [name], male    DOB: 07-19-1960, 54 y.o.   MRN: [id number]  HPI . Follow diabetes mellitus. Says he eats what he wants to. Not exercising regularly. His older short of breath if he goes up stairs but otherwise exercise tolerance is baseline. No lower extremity edema. No dyspnea on exertion that is unchanged from his baseline. No PND.  #2. Obstructive sleep apnea: Not using his CPAP machine. Says he could not tolerate the full mask. He hasn't used it in years. He is not even sure which home health company gave him the machine.  #3. Right knee pain is worse. Also having some episodes where it gives way. He has previously had surgery on that knee. Injured it as a teenager. Pain medicine seems to be working okay keeping his pain in 2-3 out of 10 most of the time. He is using it fairly regularly now. Also having some left knee pain but mostly the right is what's bothering him.   Review of Systems No unusual weight change, fever, sweats, chills. No episodes of low blood sugar, no dizziness. See history of present illness above.    Objective:   Physical Exam  Vital signs are reviewed GENERAL: Obese male no acute distress CV: Regular rate and rhythm without murmur Lungs: Clear to auscultation bilaterally Abdomen: Obese, soft, positive bowel sounds. Nontender nondistended. No masses noted but the exam is limited by habitus. EXTREMITY: Knee: Right. Mild tenderness to palpation along the medial joint line in the medial portion of the patellar tendon. He has full extension and flexion. Mild crepitus on extension. He's ligamentously intact to varus and valgus stress and anterior drawer seems equivalent to the left knee. Distally he is neurovascularly intact. Calf is soft.      Assessment & Plan:

## 2014-11-15 NOTE — Assessment & Plan Note (Signed)
Knee pains getting worse. I suspect given his history his DJD is increasing. He's having some meniscal symptoms but we can't do MRI because of his defibrillator so we'll start with complete right view knee and bilateral standing knees. Refilled his pain medication all follow this up in 2 months.

## 2014-11-15 NOTE — Patient Instructions (Signed)
I am ordering Home Health to re-evaluate your CPAP needs Your X Ray orders are in Let me see you in 2 months

## 2014-11-15 NOTE — Assessment & Plan Note (Signed)
Repeat x-rays ordered including standing.

## 2014-11-15 NOTE — Assessment & Plan Note (Signed)
I really think it would be important for him to treat his obstructive sleep apnea. We spent a long time today discussing that. I have given him contact number for [employer] to see if we can get a wrap to go out and evaluate his machine and see what he needs. If for some reason that particular contact number doesn't work, I want him to call us by

## 2014-11-16 ENCOUNTER — Telehealth: Payer: Self-pay | Admitting: Family Medicine

## 2014-11-16 ENCOUNTER — Encounter: Payer: Self-pay | Admitting: Family Medicine

## 2014-11-16 NOTE — Telephone Encounter (Signed)
Wife called and said that they need a prescription for a CPAP machine faxed to [phone]. jw

## 2014-11-17 ENCOUNTER — Ambulatory Visit
Admission: RE | Admit: 2014-11-17 | Discharge: 2014-11-17 | Disposition: A | Discharge status: Home / Self Care | Payer: Medicare Other | Source: Ambulatory Visit | Attending: Family Medicine | Admitting: Family Medicine

## 2014-11-17 ENCOUNTER — Encounter: Payer: Self-pay | Admitting: Family Medicine

## 2014-11-17 DIAGNOSIS — M25461 Effusion, right knee: Secondary | ICD-10-CM | POA: Diagnosis not present

## 2014-11-17 DIAGNOSIS — M1711 Unilateral primary osteoarthritis, right knee: Secondary | ICD-10-CM | POA: Diagnosis not present

## 2014-11-17 DIAGNOSIS — G8929 Other chronic pain: Secondary | ICD-10-CM

## 2014-11-17 DIAGNOSIS — M25561 Pain in right knee: Principal | ICD-10-CM

## 2014-11-20 ENCOUNTER — Ambulatory Visit (INDEPENDENT_AMBULATORY_CARE_PROVIDER_SITE_OTHER): Payer: Medicare Other | Admitting: *Deleted

## 2014-11-20 DIAGNOSIS — I429 Cardiomyopathy, unspecified: Secondary | ICD-10-CM

## 2014-11-20 DIAGNOSIS — I428 Other cardiomyopathies: Secondary | ICD-10-CM | POA: Diagnosis not present

## 2014-11-20 DIAGNOSIS — I5022 Chronic systolic (congestive) heart failure: Secondary | ICD-10-CM | POA: Diagnosis not present

## 2014-11-20 LAB — CUP PACEART INCLINIC DEVICE CHECK
Battery Voltage: 2.64 V
Brady Statistic RV Percent Paced: 0 %
Date Time Interrogation Session: 20160815104427
HighPow Impedance: 38 Ohm
HighPow Impedance: 38 Ohm
HighPow Impedance: 38 Ohm
HighPow Impedance: 39 Ohm
HighPow Impedance: 39 Ohm
HighPow Impedance: 39 Ohm
HighPow Impedance: 39 Ohm
HighPow Impedance: 39 Ohm
HighPow Impedance: 39 Ohm
HighPow Impedance: 39 Ohm
HighPow Impedance: 39 Ohm
HighPow Impedance: 40 Ohm
HighPow Impedance: 40 Ohm
HighPow Impedance: 40 Ohm
HighPow Impedance: 40 Ohm
HighPow Impedance: 41 Ohm
HighPow Impedance: 47 Ohm
HighPow Impedance: 48 Ohm
HighPow Impedance: 49 Ohm
HighPow Impedance: 49 Ohm
HighPow Impedance: 49 Ohm
HighPow Impedance: 49 Ohm
HighPow Impedance: 50 Ohm
HighPow Impedance: 50 Ohm
HighPow Impedance: 50 Ohm
HighPow Impedance: 50 Ohm
HighPow Impedance: 50 Ohm
HighPow Impedance: 50 Ohm
HighPow Impedance: 50 Ohm
HighPow Impedance: 51 Ohm
HighPow Impedance: 51 Ohm
Lead Channel Impedance Value: 376 Ohm
Lead Channel Impedance Value: 392 Ohm
Lead Channel Impedance Value: 392 Ohm
Lead Channel Impedance Value: 400 Ohm
Lead Channel Impedance Value: 400 Ohm
Lead Channel Impedance Value: 400 Ohm
Lead Channel Impedance Value: 400 Ohm
Lead Channel Impedance Value: 408 Ohm
Lead Channel Impedance Value: 408 Ohm
Lead Channel Impedance Value: 408 Ohm
Lead Channel Impedance Value: 416 Ohm
Lead Channel Impedance Value: 416 Ohm
Lead Channel Impedance Value: 416 Ohm
Lead Channel Impedance Value: 424 Ohm
Lead Channel Impedance Value: 424 Ohm
Lead Channel Pacing Threshold Amplitude: 2.5 V
Lead Channel Pacing Threshold Pulse Width: 0.06 ms
Lead Channel Sensing Intrinsic Amplitude: 5.7 mV
Lead Channel Sensing Intrinsic Amplitude: 5.8 mV
Lead Channel Sensing Intrinsic Amplitude: 5.8 mV
Lead Channel Sensing Intrinsic Amplitude: 5.8 mV
Lead Channel Sensing Intrinsic Amplitude: 5.8 mV
Lead Channel Sensing Intrinsic Amplitude: 6 mV
Lead Channel Sensing Intrinsic Amplitude: 6.1 mV
Lead Channel Sensing Intrinsic Amplitude: 6.1 mV
Lead Channel Sensing Intrinsic Amplitude: 6.3 mV
Lead Channel Sensing Intrinsic Amplitude: 6.5 mV
Lead Channel Sensing Intrinsic Amplitude: 6.5 mV
Lead Channel Sensing Intrinsic Amplitude: 6.5 mV
Lead Channel Sensing Intrinsic Amplitude: 6.6 mV
Lead Channel Sensing Intrinsic Amplitude: 6.9 mV
Lead Channel Sensing Intrinsic Amplitude: 7 mV
Lead Channel Setting Pacing Amplitude: 2.5 V
Lead Channel Setting Pacing Pulse Width: 0.4 ms
Lead Channel Setting Sensing Sensitivity: 0.3 mV
Zone Setting Detection Interval: 260 ms
Zone Setting Detection Interval: 300 ms
Zone Setting Detection Interval: 340 ms

## 2014-11-20 NOTE — Telephone Encounter (Signed)
Wife called back to check the status of her husbands CPAP machine and also they need a nasal mask too. jw

## 2014-11-20 NOTE — Progress Notes (Signed)
ICD check in clinic (N/C). Normal device function. Threshold and sensing consistent with previous device measurements. Impedance trends stable over time. Histogram distribution appropriate for patient and level of activity. No changes made this session. Device programmed at appropriate safety margins. Device programmed to optimize intrinsic conduction. Batt voltage 2.64V (ERI 2.62V). Pt to follow up with [facility] on 10-10 @ 0900 (billable). Patient education completed including shock plan. Alert tones demonstrated for patient.

## 2014-11-24 NOTE — Telephone Encounter (Signed)
[facility] stating CPAP rx will be faxed this evening per Dr. Nori Riis. If pt's wife, [name], calls back please advise her about this. Braxtyn Dorff, CMA.

## 2014-11-24 NOTE — Telephone Encounter (Signed)
Dear [name] Team I will fill out this PM and fax [facility]! [name]

## 2014-11-24 NOTE — Telephone Encounter (Signed)
Faxed [name]

## 2014-11-29 DIAGNOSIS — E785 Hyperlipidemia, unspecified: Secondary | ICD-10-CM | POA: Diagnosis not present

## 2014-11-29 DIAGNOSIS — I10 Essential (primary) hypertension: Secondary | ICD-10-CM | POA: Diagnosis not present

## 2014-11-29 DIAGNOSIS — I420 Dilated cardiomyopathy: Secondary | ICD-10-CM | POA: Diagnosis not present

## 2014-11-29 DIAGNOSIS — E119 Type 2 diabetes mellitus without complications: Secondary | ICD-10-CM | POA: Diagnosis not present

## 2014-11-29 DIAGNOSIS — I5020 Unspecified systolic (congestive) heart failure: Secondary | ICD-10-CM | POA: Diagnosis not present

## 2014-11-29 DIAGNOSIS — G4733 Obstructive sleep apnea (adult) (pediatric): Secondary | ICD-10-CM | POA: Diagnosis not present

## 2014-11-29 DIAGNOSIS — E669 Obesity, unspecified: Secondary | ICD-10-CM | POA: Diagnosis not present

## 2014-12-01 ENCOUNTER — Encounter: Payer: Self-pay | Admitting: Internal Medicine

## 2015-01-08 ENCOUNTER — Encounter: Payer: Self-pay | Admitting: Internal Medicine

## 2015-01-15 ENCOUNTER — Encounter: Payer: Medicare Other | Admitting: Internal Medicine

## 2015-01-17 ENCOUNTER — Encounter: Payer: Self-pay | Admitting: Family Medicine

## 2015-01-17 ENCOUNTER — Encounter: Payer: Self-pay | Admitting: *Deleted

## 2015-01-17 ENCOUNTER — Ambulatory Visit (INDEPENDENT_AMBULATORY_CARE_PROVIDER_SITE_OTHER): Payer: Medicare Other | Admitting: Family Medicine

## 2015-01-17 VITALS — BP 126/76 | HR 66 | Temp 98.3°F | Ht 62.0 in | Wt 332.0 lb

## 2015-01-17 DIAGNOSIS — E118 Type 2 diabetes mellitus with unspecified complications: Secondary | ICD-10-CM

## 2015-01-17 DIAGNOSIS — N183 Chronic kidney disease, stage 3 unspecified: Secondary | ICD-10-CM

## 2015-01-17 DIAGNOSIS — E1165 Type 2 diabetes mellitus with hyperglycemia: Secondary | ICD-10-CM

## 2015-01-17 DIAGNOSIS — Z23 Encounter for immunization: Secondary | ICD-10-CM

## 2015-01-17 DIAGNOSIS — M170 Bilateral primary osteoarthritis of knee: Secondary | ICD-10-CM

## 2015-01-17 LAB — BASIC METABOLIC PANEL
BUN: 14 mg/dL (ref 7–25)
CO2: 27 mmol/L (ref 20–31)
Calcium: 8.7 mg/dL (ref 8.6–10.3)
Chloride: 106 mmol/L (ref 98–110)
Creat: 1.29 mg/dL (ref 0.70–1.33)
Glucose, Bld: 153 mg/dL — ABNORMAL HIGH (ref 65–99)
Potassium: 4.6 mmol/L (ref 3.5–5.3)
Sodium: 141 mmol/L (ref 135–146)

## 2015-01-17 LAB — POCT GLYCOSYLATED HEMOGLOBIN (HGB A1C): Hemoglobin A1C: 7.2

## 2015-01-17 MED ORDER — HYDROCODONE-ACETAMINOPHEN 5-325 MG PO TABS: ORAL_TABLET | ORAL | Status: DC

## 2015-01-17 NOTE — Assessment & Plan Note (Signed)
Continue current pain management. I would really like to see him lose some weight.

## 2015-01-17 NOTE — Progress Notes (Signed)
   Subjective:    Patient ID: [name], male    DOB: [birth year], 54 y.o.   MRN: [id number]  HPI Follow-up diabetes mellitus. He's trying to follow a better diet but not exercising. No episodes of low blood sugar. Not really been checking his blood sugar much. No change in his pain medicines. #2. Cardiomyopathy. He's scheduled to have his ICD interrogated sometime next week and is supposed to see his cardiologist as well. No new symptoms. He's not exercising so he can't tell if his exercises hard just changed. No lower extremity edema. #3. Knee pain. Chronic. Unchanged. Well managed currently by pain medicines.   Review of Systems See history of present illness.    Objective:   Physical Exam  Vital signs reviewed. GENERAL: Well-developed, well-nourished, no acute distress. CARDIOVASCULAR: Regular rate and rhythm no murmur gallop or rub LUNGS: Clear to auscultation bilaterally, no rales or wheeze. ABDOMEN: Soft positive bowel sounds. Obese. MSK: Movement of extremity x 4. Gait is a little broad-based but it secondary to his obesity.        Assessment & Plan:

## 2015-01-18 ENCOUNTER — Ambulatory Visit (INDEPENDENT_AMBULATORY_CARE_PROVIDER_SITE_OTHER): Payer: Medicare Other | Admitting: Internal Medicine

## 2015-01-18 ENCOUNTER — Encounter: Payer: Self-pay | Admitting: Internal Medicine

## 2015-01-18 VITALS — BP 130/90 | HR 77 | Ht 66.0 in | Wt 335.2 lb

## 2015-01-18 DIAGNOSIS — I10 Essential (primary) hypertension: Secondary | ICD-10-CM | POA: Diagnosis not present

## 2015-01-18 DIAGNOSIS — I5022 Chronic systolic (congestive) heart failure: Secondary | ICD-10-CM | POA: Diagnosis not present

## 2015-01-18 DIAGNOSIS — Z4502 Encounter for adjustment and management of automatic implantable cardiac defibrillator: Secondary | ICD-10-CM

## 2015-01-18 LAB — CUP PACEART INCLINIC DEVICE CHECK
Battery Voltage: 2.64 V
Brady Statistic RV Percent Paced: 0 %
Date Time Interrogation Session: 20161013111819
HighPow Impedance: 38 Ohm
HighPow Impedance: 39 Ohm
HighPow Impedance: 39 Ohm
HighPow Impedance: 39 Ohm
HighPow Impedance: 40 Ohm
HighPow Impedance: 40 Ohm
HighPow Impedance: 40 Ohm
HighPow Impedance: 40 Ohm
HighPow Impedance: 40 Ohm
HighPow Impedance: 40 Ohm
HighPow Impedance: 40 Ohm
HighPow Impedance: 40 Ohm
HighPow Impedance: 41 Ohm
HighPow Impedance: 41 Ohm
HighPow Impedance: 41 Ohm
HighPow Impedance: 43 Ohm
HighPow Impedance: 49 Ohm
HighPow Impedance: 49 Ohm
HighPow Impedance: 49 Ohm
HighPow Impedance: 50 Ohm
HighPow Impedance: 50 Ohm
HighPow Impedance: 51 Ohm
HighPow Impedance: 51 Ohm
HighPow Impedance: 51 Ohm
HighPow Impedance: 51 Ohm
HighPow Impedance: 51 Ohm
HighPow Impedance: 52 Ohm
HighPow Impedance: 52 Ohm
HighPow Impedance: 52 Ohm
HighPow Impedance: 52 Ohm
HighPow Impedance: 53 Ohm
Implantable Lead Implant Date: 20081114
Implantable Lead Location: 753860
Implantable Lead Model: 6947
Lead Channel Impedance Value: 392 Ohm
Lead Channel Impedance Value: 392 Ohm
Lead Channel Impedance Value: 392 Ohm
Lead Channel Impedance Value: 400 Ohm
Lead Channel Impedance Value: 400 Ohm
Lead Channel Impedance Value: 408 Ohm
Lead Channel Impedance Value: 408 Ohm
Lead Channel Impedance Value: 416 Ohm
Lead Channel Impedance Value: 416 Ohm
Lead Channel Impedance Value: 416 Ohm
Lead Channel Impedance Value: 416 Ohm
Lead Channel Impedance Value: 416 Ohm
Lead Channel Impedance Value: 416 Ohm
Lead Channel Impedance Value: 424 Ohm
Lead Channel Impedance Value: 432 Ohm
Lead Channel Pacing Threshold Amplitude: 2.5 V
Lead Channel Pacing Threshold Pulse Width: 0.04 ms
Lead Channel Sensing Intrinsic Amplitude: 6.1 mV
Lead Channel Sensing Intrinsic Amplitude: 6.3 mV
Lead Channel Sensing Intrinsic Amplitude: 6.3 mV
Lead Channel Sensing Intrinsic Amplitude: 6.3 mV
Lead Channel Sensing Intrinsic Amplitude: 6.5 mV
Lead Channel Sensing Intrinsic Amplitude: 6.7 mV
Lead Channel Sensing Intrinsic Amplitude: 7 mV
Lead Channel Sensing Intrinsic Amplitude: 7 mV
Lead Channel Sensing Intrinsic Amplitude: 7.4 mV
Lead Channel Sensing Intrinsic Amplitude: 7.4 mV
Lead Channel Sensing Intrinsic Amplitude: 7.5 mV
Lead Channel Setting Pacing Amplitude: 2.5 V
Lead Channel Setting Pacing Pulse Width: 0.4 ms
Lead Channel Setting Sensing Sensitivity: 0.3 mV
Zone Setting Detection Interval: 260 ms
Zone Setting Detection Interval: 300 ms
Zone Setting Detection Interval: 340 ms

## 2015-01-18 NOTE — Progress Notes (Signed)
Patient Care Team: Dickie La, MD as PCP - General   HPI  [name] is a 54 y.o. male seen in followup for an ICD implanted in the setting of nonischemic heart disease and congestive heart failure. This was implanted for primary prevention. He is doing pretty well. He denies CP and SOB, or edema He has sleep apnea. He has not tolerated wearing the mask.  Follow-up on sleep therapy unfortunately resulted in no  Response    He saw Dr. [facility] recently. Functional status is stable.   Past Medical History  Diagnosis Date  . Severe hypertension   . Chest pain   . Cardiomyopathy     with borderline ejection fraction in this 54 year old male  . Arthritis   . Cataracts, bilateral     right  . Congestive heart failure ([place])   . Diabetes mellitus     does not check blood sugars at home  . Glaucoma     bilateral  . Hyperlipidemia   . Hypertension   . H/O TIA (transient ischemic attack) and stroke     with heart issues  . Implantable cardiac defibrillator -Medtronic 07/15/2013    Past Surgical History  Procedure Laterality Date  . Cardiac catheterization  10/24/2003    EF of 45-50%  . Cardiac defibrillator placement  2008  . Cataract extraction  2012    right  . Shoulder surgery  2012    left  . Knee surgery Right 2000    Current Outpatient Prescriptions  Medication Sig Dispense Refill  . amLODipine (NORVASC) 10 MG tablet Take 10 mg by mouth daily.      Marland Kitchen aspirin 81 MG tablet Take 81 mg by mouth daily.    . carvedilol (COREG) 25 MG tablet Take 1 tablet (25 mg total) by mouth 2 (two) times daily with a meal.    . EPINEPHrine 0.3 mg/0.3 mL IJ SOAJ injection Inject 0.3 mg into the muscle as needed (Anaphylaxis).    . furosemide (LASIX) 40 MG tablet Take 40 mg by mouth daily as needed for fluid.    Marland Kitchen glipiZIDE (GLUCOTROL) 5 MG tablet Take 1 tablet (5 mg total) by mouth 2 (two) times daily before a meal. 180 tablet 3  . HYDROcodone-acetaminophen (NORCO/VICODIN) 5-325  MG tablet Take 1-2 tablets by mouth every 6 (six) hours as needed for moderate pain.    . isosorbide mononitrate (ISMO,MONOKET) 20 MG tablet Take 1 tablet (20 mg total) by mouth 2 (two) times daily. 180 tablet 3  . losartan (COZAAR) 100 MG tablet Take 100 mg by mouth daily.      . metFORMIN (GLUCOPHAGE) 1000 MG tablet Take 1 tablet (1,000 mg total) by mouth 2 (two) times daily with a meal. 280 tablet 3  . pravastatin (PRAVACHOL) 80 MG tablet Take 80 mg by mouth at bedtime.     Marland Kitchen spironolactone (ALDACTONE) 25 MG tablet Take 25 mg by mouth 2 (two) times daily. Per Dr. Glennon Mac    . [DISCONTINUED] lisinopril (PRINIVIL,ZESTRIL) 40 MG tablet Take 40 mg by mouth 2 (two) times daily.       No current facility-administered medications for this visit.    Allergies  Allergen Reactions  . Bee Pollen Anaphylaxis, Itching and Swelling    Review of Systems negative except from HPI and PMH  Physical Exam BP 130/90 mmHg  Pulse 77  Ht 5\' 6"  (1.676 m)  Wt 335 lb 3.2 oz (152.046 kg)  BMI 54.13 kg/m2  Well developed and morbidly obese in no acute distress HENT normal E scleral and icterus clear Neck Supple JVP flat; carotids brisk and full Clear to ausculation Device pocket well healed; without hematoma or erythema.  There is no tethering  Regular rate and rhythm, no murmurs gallops or rub Soft with active bowel sounds No clubbing cyanosis none Edema Alert and oriented, grossly normal motor and sensory function Skin Warm and Dry  ECG demonstrates sinus rhythm at 77 Intervals 17/10/39  Assessment and  Plan  Nonischemic cardiomyopathy  Hypertension  Morbid obesity  Obstructive sleep apnea  Implantable defibrillator-Medtronic  The patient's device was interrogated.  The information was reviewed. No changes were made in the programming.    No success in weight loss  His device is approaching ERI. He will be seen in 2 months in the device clinic.  He has not been able to successfully  utilized sleep therapy  With his cardiomyopathy, I've asked him to discuss with Dr. Ophelia Charter whether we can stop his amlodipine and use either hydralazine and or Entresto    e

## 2015-01-18 NOTE — Patient Instructions (Addendum)
Medication Instructions: - no changes  Labwork: - none  Procedures/Testing: - none  Follow-Up: - Your physician recommends that you schedule a follow-up appointment in: 2 months with the device clinic for a battery check  - Your physician wants you to follow-up in: 1 year with Dr. Caryl Comes. You will receive a reminder letter in the mail two months in advance. If you don't receive a letter, please call our office to schedule the follow-up appointment.  Any Additional Special Instructions Will Be Listed Below (If Applicable).

## 2015-01-25 ENCOUNTER — Encounter: Payer: Self-pay | Admitting: Internal Medicine

## 2015-01-26 ENCOUNTER — Encounter: Payer: Self-pay | Admitting: Family Medicine

## 2015-02-28 DIAGNOSIS — I472 Ventricular tachycardia: Secondary | ICD-10-CM | POA: Diagnosis not present

## 2015-02-28 DIAGNOSIS — E119 Type 2 diabetes mellitus without complications: Secondary | ICD-10-CM | POA: Diagnosis not present

## 2015-02-28 DIAGNOSIS — I119 Hypertensive heart disease without heart failure: Secondary | ICD-10-CM | POA: Diagnosis not present

## 2015-02-28 DIAGNOSIS — E785 Hyperlipidemia, unspecified: Secondary | ICD-10-CM | POA: Diagnosis not present

## 2015-02-28 DIAGNOSIS — E669 Obesity, unspecified: Secondary | ICD-10-CM | POA: Diagnosis not present

## 2015-02-28 DIAGNOSIS — I420 Dilated cardiomyopathy: Secondary | ICD-10-CM | POA: Diagnosis not present

## 2015-03-22 ENCOUNTER — Ambulatory Visit (INDEPENDENT_AMBULATORY_CARE_PROVIDER_SITE_OTHER): Payer: Medicare Other | Admitting: *Deleted

## 2015-03-22 DIAGNOSIS — Z4502 Encounter for adjustment and management of automatic implantable cardiac defibrillator: Secondary | ICD-10-CM

## 2015-03-22 LAB — CUP PACEART INCLINIC DEVICE CHECK
Battery Voltage: 2.64 V
Brady Statistic RV Percent Paced: 0 %
Date Time Interrogation Session: 20161215085209
HighPow Impedance: 38 Ohm
HighPow Impedance: 39 Ohm
HighPow Impedance: 40 Ohm
HighPow Impedance: 40 Ohm
HighPow Impedance: 40 Ohm
HighPow Impedance: 40 Ohm
HighPow Impedance: 40 Ohm
HighPow Impedance: 41 Ohm
HighPow Impedance: 41 Ohm
HighPow Impedance: 41 Ohm
HighPow Impedance: 41 Ohm
HighPow Impedance: 41 Ohm
HighPow Impedance: 42 Ohm
HighPow Impedance: 42 Ohm
HighPow Impedance: 42 Ohm
HighPow Impedance: 43 Ohm
HighPow Impedance: 51 Ohm
HighPow Impedance: 51 Ohm
HighPow Impedance: 51 Ohm
HighPow Impedance: 51 Ohm
HighPow Impedance: 51 Ohm
HighPow Impedance: 51 Ohm
HighPow Impedance: 51 Ohm
HighPow Impedance: 52 Ohm
HighPow Impedance: 52 Ohm
HighPow Impedance: 52 Ohm
HighPow Impedance: 52 Ohm
HighPow Impedance: 53 Ohm
HighPow Impedance: 54 Ohm
HighPow Impedance: 54 Ohm
HighPow Impedance: 57 Ohm
Implantable Lead Implant Date: 20081114
Implantable Lead Location: 753860
Implantable Lead Model: 6947
Lead Channel Impedance Value: 384 Ohm
Lead Channel Impedance Value: 384 Ohm
Lead Channel Impedance Value: 392 Ohm
Lead Channel Impedance Value: 400 Ohm
Lead Channel Impedance Value: 408 Ohm
Lead Channel Impedance Value: 408 Ohm
Lead Channel Impedance Value: 416 Ohm
Lead Channel Impedance Value: 416 Ohm
Lead Channel Impedance Value: 416 Ohm
Lead Channel Impedance Value: 424 Ohm
Lead Channel Impedance Value: 424 Ohm
Lead Channel Impedance Value: 424 Ohm
Lead Channel Impedance Value: 424 Ohm
Lead Channel Impedance Value: 432 Ohm
Lead Channel Impedance Value: 432 Ohm
Lead Channel Sensing Intrinsic Amplitude: 6.5 mV
Lead Channel Sensing Intrinsic Amplitude: 6.7 mV
Lead Channel Sensing Intrinsic Amplitude: 6.8 mV
Lead Channel Sensing Intrinsic Amplitude: 6.9 mV
Lead Channel Sensing Intrinsic Amplitude: 6.9 mV
Lead Channel Sensing Intrinsic Amplitude: 6.9 mV
Lead Channel Sensing Intrinsic Amplitude: 7 mV
Lead Channel Sensing Intrinsic Amplitude: 7.1 mV
Lead Channel Sensing Intrinsic Amplitude: 7.1 mV
Lead Channel Sensing Intrinsic Amplitude: 7.2 mV
Lead Channel Sensing Intrinsic Amplitude: 7.2 mV
Lead Channel Sensing Intrinsic Amplitude: 7.2 mV
Lead Channel Sensing Intrinsic Amplitude: 7.4 mV
Lead Channel Sensing Intrinsic Amplitude: 7.4 mV
Lead Channel Sensing Intrinsic Amplitude: 7.6 mV
Lead Channel Setting Pacing Amplitude: 2.5 V
Lead Channel Setting Pacing Pulse Width: 0.4 ms
Lead Channel Setting Sensing Sensitivity: 0.3 mV

## 2015-03-22 NOTE — Progress Notes (Signed)
ICD battery check only. Remaining power: 2.64V (ERI=2.62V). ROV with device clinic 05/20/14 for battery check (billable), and ROV with [facility] in October 2017.

## 2015-04-09 ENCOUNTER — Emergency Department (HOSPITAL_COMMUNITY)
Admission: EM | Admit: 2015-04-09 | Discharge: 2015-04-09 | Disposition: A | Discharge status: Home / Self Care | Payer: Medicare Other | Attending: Emergency Medicine | Admitting: Emergency Medicine

## 2015-04-09 ENCOUNTER — Emergency Department (HOSPITAL_COMMUNITY): Payer: Medicare Other

## 2015-04-09 ENCOUNTER — Encounter (HOSPITAL_COMMUNITY): Payer: Self-pay | Admitting: Emergency Medicine

## 2015-04-09 DIAGNOSIS — R030 Elevated blood-pressure reading, without diagnosis of hypertension: Secondary | ICD-10-CM | POA: Diagnosis not present

## 2015-04-09 DIAGNOSIS — Z8669 Personal history of other diseases of the nervous system and sense organs: Secondary | ICD-10-CM | POA: Insufficient documentation

## 2015-04-09 DIAGNOSIS — Y658 Other specified misadventures during surgical and medical care: Secondary | ICD-10-CM | POA: Insufficient documentation

## 2015-04-09 DIAGNOSIS — T82118A Breakdown (mechanical) of other cardiac electronic device, initial encounter: Secondary | ICD-10-CM | POA: Diagnosis not present

## 2015-04-09 DIAGNOSIS — Z8673 Personal history of transient ischemic attack (TIA), and cerebral infarction without residual deficits: Secondary | ICD-10-CM | POA: Diagnosis not present

## 2015-04-09 DIAGNOSIS — Z79899 Other long term (current) drug therapy: Secondary | ICD-10-CM | POA: Diagnosis not present

## 2015-04-09 DIAGNOSIS — M1990 Unspecified osteoarthritis, unspecified site: Secondary | ICD-10-CM | POA: Diagnosis not present

## 2015-04-09 DIAGNOSIS — Z7982 Long term (current) use of aspirin: Secondary | ICD-10-CM | POA: Insufficient documentation

## 2015-04-09 DIAGNOSIS — Z95810 Presence of automatic (implantable) cardiac defibrillator: Secondary | ICD-10-CM | POA: Diagnosis not present

## 2015-04-09 DIAGNOSIS — E785 Hyperlipidemia, unspecified: Secondary | ICD-10-CM | POA: Diagnosis not present

## 2015-04-09 DIAGNOSIS — E119 Type 2 diabetes mellitus without complications: Secondary | ICD-10-CM | POA: Insufficient documentation

## 2015-04-09 DIAGNOSIS — T829XXA Unspecified complication of cardiac and vascular prosthetic device, implant and graft, initial encounter: Secondary | ICD-10-CM

## 2015-04-09 DIAGNOSIS — I10 Essential (primary) hypertension: Secondary | ICD-10-CM | POA: Insufficient documentation

## 2015-04-09 DIAGNOSIS — I509 Heart failure, unspecified: Secondary | ICD-10-CM | POA: Insufficient documentation

## 2015-04-09 DIAGNOSIS — T82198A Other mechanical complication of other cardiac electronic device, initial encounter: Secondary | ICD-10-CM | POA: Insufficient documentation

## 2015-04-09 LAB — I-STAT CHEM 8, ED
BUN: 18 mg/dL (ref 6–20)
Calcium, Ion: 1.22 mmol/L (ref 1.12–1.23)
Chloride: 107 mmol/L (ref 101–111)
Creatinine, Ser: 1.4 mg/dL — ABNORMAL HIGH (ref 0.61–1.24)
Glucose, Bld: 154 mg/dL — ABNORMAL HIGH (ref 65–99)
HCT: 40 % (ref 39.0–52.0)
Hemoglobin: 13.6 g/dL (ref 13.0–17.0)
Potassium: 5 mmol/L (ref 3.5–5.1)
Sodium: 142 mmol/L (ref 135–145)
TCO2: 24 mmol/L (ref 0–100)

## 2015-04-09 NOTE — ED Notes (Signed)
Per MD Regenia Skeeter pt may take po daily meds.

## 2015-04-09 NOTE — ED Provider Notes (Signed)
CSN: [id number]     Arrival date & time 04/09/15  [id number] History   First MD Initiated Contact with Patient 04/09/15 0914     Chief Complaint  Patient presents with  . AICD Problem     (Consider location/radiation/quality/duration/timing/severity/associated sxs/prior Treatment) HPI  55 year old male with a history of cardiomyopathy, CHF, and an ICD presents with his ICD beeping. Started beeping yesterday morning for sure period of time. Then this morning at around the same time it be again. Patient denies any shocks, palpitations, chest pain. He has chronic exertional shortness but that is not worse than normal. No pain at the pacemaker site.  Past Medical History  Diagnosis Date  . Severe hypertension   . Chest pain   . Cardiomyopathy     with borderline ejection fraction in this 55 year old male  . Arthritis   . Cataracts, bilateral     right  . Congestive heart failure ([place])   . Diabetes mellitus     does not check blood sugars at home  . Glaucoma     bilateral  . Hyperlipidemia   . Hypertension   . H/O TIA (transient ischemic attack) and stroke     with heart issues  . Implantable cardiac defibrillator -Medtronic 07/15/2013   Past Surgical History  Procedure Laterality Date  . Cardiac catheterization  10/24/2003    EF of 45-50%  . Cardiac defibrillator placement  2008  . Cataract extraction  2012    right  . Shoulder surgery  2012    left  . Knee surgery Right 2000   Family History  Problem Relation Age of Onset  . Colon cancer Father   . Esophageal cancer Neg Hx   . Rectal cancer Neg Hx   . Stomach cancer Neg Hx    Social History  Substance Use Topics  . Smoking status: Never Smoker   . Smokeless tobacco: Never Used  . Alcohol Use: No    Review of Systems  Respiratory: Negative for cough and shortness of breath.   Cardiovascular: Negative for chest pain and palpitations.  Neurological: Negative for dizziness, syncope and light-headedness.  All other  systems reviewed and are negative.     Allergies  Bee pollen  Home Medications   Prior to Admission medications   Medication Sig Start Date End Date Taking? Authorizing Provider  amLODipine (NORVASC) 10 MG tablet Take 10 mg by mouth daily.      Historical Provider, MD  aspirin 81 MG tablet Take 81 mg by mouth daily.    Historical Provider, MD  carvedilol (COREG) 25 MG tablet Take 1 tablet (25 mg total) by mouth 2 (two) times daily with a meal. 02/11/12   Dickie La, MD  EPINEPHrine 0.3 mg/0.3 mL IJ SOAJ injection Inject 0.3 mg into the muscle as needed (Anaphylaxis).    Historical Provider, MD  furosemide (LASIX) 40 MG tablet Take 40 mg by mouth daily as needed for fluid. Reported on 03/22/2015    Historical Provider, MD  glipiZIDE (GLUCOTROL) 5 MG tablet Take 1 tablet (5 mg total) by mouth 2 (two) times daily before a meal. 07/12/14   Dickie La, MD  HYDROcodone-acetaminophen (NORCO/VICODIN) 5-325 MG tablet Take 1-2 tablets by mouth every 6 (six) hours as needed for moderate pain.    Historical Provider, MD  isosorbide mononitrate (ISMO,MONOKET) 20 MG tablet Take 1 tablet (20 mg total) by mouth 2 (two) times daily. 07/12/14   Dickie La, MD  losartan (COZAAR) 100 MG tablet  Take 100 mg by mouth daily.      Historical Provider, MD  metFORMIN (GLUCOPHAGE) 1000 MG tablet Take 1 tablet (1,000 mg total) by mouth 2 (two) times daily with a meal. 10/25/14   Dickie La, MD  pravastatin (PRAVACHOL) 80 MG tablet Take 80 mg by mouth at bedtime.     Historical Provider, MD  spironolactone (ALDACTONE) 25 MG tablet Take 25 mg by mouth 2 (two) times daily. Per Dr. Glennon Mac    Historical Provider, MD   BP 142/76 mmHg  Pulse 72  Temp(Src) 98.9 F (37.2 C) (Oral)  Resp 18  SpO2 98% Physical Exam  Constitutional: He is oriented to person, place, and time. He appears well-developed and well-nourished.  Morbidly obese  HENT:  Head: Normocephalic and atraumatic.  Right Ear: External ear normal.  Left  Ear: External ear normal.  Nose: Nose normal.  Eyes: Right eye exhibits no discharge. Left eye exhibits no discharge.  Neck: Neck supple.  Cardiovascular: Normal rate, regular rhythm, normal heart sounds and intact distal pulses.   Pulmonary/Chest: Effort normal and breath sounds normal.    Abdominal: Soft. There is no tenderness.  Neurological: He is alert and oriented to person, place, and time.  Skin: Skin is warm and dry. He is not diaphoretic.  Nursing note and vitals reviewed.   ED Course  Procedures (including critical care time) Labs Review Labs Reviewed  I-STAT CHEM 8, ED - Abnormal; Notable for the following:    Creatinine, Ser 1.40 (*)    Glucose, Bld 154 (*)    All other components within normal limits    Imaging Review Dg Chest Port 1 View  04/09/2015  CLINICAL DATA:  eval pacemaker; pt states his defibrillator keeps "going off and beeping". It is not shocking him. Hx device implanted 2009 EXAM: PORTABLE CHEST 1 VIEW COMPARISON:  Chest radiograph 02/23/2014. FINDINGS: Single lead AICD device overlies the left hemi thorax, lead is stable in position. Stable cardiac and mediastinal contours. No consolidative pulmonary opacities. No pleural effusion or pneumothorax. IMPRESSION: No acute cardiopulmonary process. Electronically Signed   By: Lovey Newcomer M.D.   On: 04/09/2015 10:38   I have personally reviewed and evaluated these images and lab results as part of my medical decision-making.   EKG Interpretation   Date/Time:  Monday April 09 2015 09:09:00 EST Ventricular Rate:  67 PR Interval:  176 QRS Duration: 109 QT Interval:  412 QTC Calculation: 435 R Axis:   22 Text Interpretation:  Sinus rhythm Low voltage, precordial leads no  significant change since 2012 Confirmed by Aqua Denslow  MD, Darcia Lampi ([id number]) on  04/09/2015 9:12:26 AM      MDM   Final diagnoses:  AICD problem    Patient's pacemaker interrogation shows that his battery has adequate energy remaining.  Discussed with cardiology, Dr. Meda Coffee. She talked to his electrophysiologist who recommends patient coming into clinic tomorrow. The beeping does not indicate impending battery failure but he has several months according to cardiology. They can help turn off the beeping in clinic tomorrow. No signs of ACS, arrhythmia, or other acute emergency. His cast return precautions.     Sherwood Gambler, MD 04/09/15 [id number]

## 2015-04-09 NOTE — Discharge Instructions (Signed)
The beeping of your AICD indicates you need a replacement in about 3 months. Call the cardiologist tomorrow to get an appointment to turn off the beeping. If you notice any new symptoms such as palpitations, chest pain, shortness of breath or it shocks you, return to the ER immediately.

## 2015-04-09 NOTE — ED Notes (Addendum)
Pt to ER via [facility] with complaint of ICD "alerting" him for 2 days, has not fired. Pt has medtronic ICD. Pt called PCP yesterday who thinks this is a battery issue. Pt PCP recommended that if it alerted him again to seek medical attention at ER. Pt has no other complaints at this time. A/O x4. VSS.

## 2015-04-10 ENCOUNTER — Telehealth: Payer: Self-pay | Admitting: Internal Medicine

## 2015-04-10 NOTE — Telephone Encounter (Signed)
°  1. Has your device fired? UNKNOWN  2. Is you device beeping? YES  3. Are you experiencing draining or swelling at device site? NO  4. Are you calling to see if we received your device transmission? NO  5. Have you passed out? NO  IT CUTS OFF

## 2015-04-10 NOTE — Telephone Encounter (Signed)
Spoke to patient's wife regarding the audible alert that's been going off once daily. Wife states that she was told that the alert was sounding due to elective replacement. Wife states that she was told to call the office to make an appt to have the alert disabled. I explained to wife that they could come into the office today to have it turned off, but if it's not bothering them then the alert can be turned off when the patient comes in for an appt w/[facility]. Wife preferred this due to transportation issues.  Will defer scheduling of appt to [facility].

## 2015-04-10 NOTE — Telephone Encounter (Signed)
LMTCB/SSS  Patient has been nearing ERI.

## 2015-04-11 ENCOUNTER — Ambulatory Visit (INDEPENDENT_AMBULATORY_CARE_PROVIDER_SITE_OTHER): Payer: Medicare Other | Admitting: Family Medicine

## 2015-04-11 ENCOUNTER — Encounter: Payer: Self-pay | Admitting: Family Medicine

## 2015-04-11 VITALS — BP 127/85 | HR 69 | Temp 98.1°F | Ht 62.0 in | Wt 331.2 lb

## 2015-04-11 DIAGNOSIS — Z95810 Presence of automatic (implantable) cardiac defibrillator: Secondary | ICD-10-CM

## 2015-04-11 DIAGNOSIS — E118 Type 2 diabetes mellitus with unspecified complications: Secondary | ICD-10-CM | POA: Diagnosis not present

## 2015-04-11 DIAGNOSIS — E1165 Type 2 diabetes mellitus with hyperglycemia: Secondary | ICD-10-CM

## 2015-04-11 DIAGNOSIS — M170 Bilateral primary osteoarthritis of knee: Secondary | ICD-10-CM

## 2015-04-11 LAB — POCT GLYCOSYLATED HEMOGLOBIN (HGB A1C): Hemoglobin A1C: 6.8

## 2015-04-11 MED ORDER — HYDROCODONE-ACETAMINOPHEN 5-325 MG PO TABS: 1.0000 | ORAL_TABLET | Freq: Two times a day (BID) | ORAL | Status: DC | PRN

## 2015-04-11 NOTE — Assessment & Plan Note (Signed)
Significant improvement in his A1c. He is now at goal. I would urge him to continue walking even with his cars repaired and hopefully he can continue to have some weight loss. Follow-up 3-4 months. Flu shot given today

## 2015-04-11 NOTE — Progress Notes (Signed)
   Subjective:    Patient ID: [name], male    DOB: 04/03/1961, 55 y.o.   MRN: [id number]  HPI Diabetes mellitus follow-up. He's been walking more because his car broke down. He notes that he's lost 45 pounds. His blood sugars have been down in the mid 100s to occasional 200 range. No episodes of low blood sugar. Taking his medications without problems #2. Scheduled to have his ICD battery replaced next week. Not currently having any chest pain. Exercise tolerance is unchanged.  #3. CHF: No increase in lower extremity edema. No cough. Taking meds appropriately. Dr. Bayard Males (cardiology) switched him to [facility]   Review of Systems See history of present illness. Has lost 5 pounds (intentionally)    Objective:   Physical Exam  Vital signs reviewed. GENERAL: Well-developed, well-nourished, no acute distress. CARDIOVASCULAR: Regular rate and rhythm no murmur gallop or rub LUNGS: Clear to auscultation bilaterally, no rales or wheeze. ABDOMEN: Soft positive bowel sounds NEURO: No gross focal neurological deficits. MSK: Movement of extremity x 4.        Assessment & Plan:

## 2015-04-11 NOTE — Assessment & Plan Note (Signed)
Schedule for battery replacement next week

## 2015-04-11 NOTE — Assessment & Plan Note (Signed)
Continues to have some right knee pain particularly with exertion but he is happy that it did not really get that much worse when he's been doing the extra walking  recently. Does need refill on his pain medicines

## 2015-04-17 ENCOUNTER — Ambulatory Visit (INDEPENDENT_AMBULATORY_CARE_PROVIDER_SITE_OTHER): Payer: Medicare Other | Admitting: Internal Medicine

## 2015-04-17 ENCOUNTER — Encounter: Payer: Self-pay | Admitting: Internal Medicine

## 2015-04-17 VITALS — BP 138/80 | HR 74 | Ht 62.0 in | Wt 331.4 lb

## 2015-04-17 DIAGNOSIS — I5022 Chronic systolic (congestive) heart failure: Secondary | ICD-10-CM | POA: Diagnosis not present

## 2015-04-17 DIAGNOSIS — Z95810 Presence of automatic (implantable) cardiac defibrillator: Secondary | ICD-10-CM | POA: Diagnosis not present

## 2015-04-17 DIAGNOSIS — I428 Other cardiomyopathies: Secondary | ICD-10-CM

## 2015-04-17 DIAGNOSIS — I429 Cardiomyopathy, unspecified: Secondary | ICD-10-CM

## 2015-04-17 LAB — CUP PACEART INCLINIC DEVICE CHECK
Battery Voltage: 2.62 V
Brady Statistic RV Percent Paced: 0 %
Date Time Interrogation Session: 20170110160952
HighPow Impedance: 38 Ohm
HighPow Impedance: 39 Ohm
HighPow Impedance: 40 Ohm
HighPow Impedance: 40 Ohm
HighPow Impedance: 40 Ohm
HighPow Impedance: 40 Ohm
HighPow Impedance: 40 Ohm
HighPow Impedance: 40 Ohm
HighPow Impedance: 40 Ohm
HighPow Impedance: 40 Ohm
HighPow Impedance: 41 Ohm
HighPow Impedance: 41 Ohm
HighPow Impedance: 41 Ohm
HighPow Impedance: 42 Ohm
HighPow Impedance: 42 Ohm
HighPow Impedance: 43 Ohm
HighPow Impedance: 49 Ohm
HighPow Impedance: 50 Ohm
HighPow Impedance: 50 Ohm
HighPow Impedance: 50 Ohm
HighPow Impedance: 50 Ohm
HighPow Impedance: 50 Ohm
HighPow Impedance: 50 Ohm
HighPow Impedance: 51 Ohm
HighPow Impedance: 51 Ohm
HighPow Impedance: 51 Ohm
HighPow Impedance: 52 Ohm
HighPow Impedance: 52 Ohm
HighPow Impedance: 52 Ohm
HighPow Impedance: 54 Ohm
HighPow Impedance: 54 Ohm
Implantable Lead Implant Date: 20081114
Implantable Lead Location: 753860
Implantable Lead Model: 6947
Lead Channel Impedance Value: 384 Ohm
Lead Channel Impedance Value: 384 Ohm
Lead Channel Impedance Value: 384 Ohm
Lead Channel Impedance Value: 392 Ohm
Lead Channel Impedance Value: 408 Ohm
Lead Channel Impedance Value: 408 Ohm
Lead Channel Impedance Value: 408 Ohm
Lead Channel Impedance Value: 408 Ohm
Lead Channel Impedance Value: 416 Ohm
Lead Channel Impedance Value: 416 Ohm
Lead Channel Impedance Value: 424 Ohm
Lead Channel Impedance Value: 424 Ohm
Lead Channel Impedance Value: 432 Ohm
Lead Channel Impedance Value: 432 Ohm
Lead Channel Impedance Value: 432 Ohm
Lead Channel Pacing Threshold Amplitude: 2.5 V
Lead Channel Pacing Threshold Pulse Width: 0.06 ms
Lead Channel Sensing Intrinsic Amplitude: 6 mV
Lead Channel Sensing Intrinsic Amplitude: 6 mV
Lead Channel Sensing Intrinsic Amplitude: 6.1 mV
Lead Channel Sensing Intrinsic Amplitude: 6.3 mV
Lead Channel Sensing Intrinsic Amplitude: 6.3 mV
Lead Channel Sensing Intrinsic Amplitude: 6.7 mV
Lead Channel Sensing Intrinsic Amplitude: 6.8 mV
Lead Channel Sensing Intrinsic Amplitude: 6.8 mV
Lead Channel Sensing Intrinsic Amplitude: 6.8 mV
Lead Channel Sensing Intrinsic Amplitude: 7.2 mV
Lead Channel Sensing Intrinsic Amplitude: 7.2 mV
Lead Channel Sensing Intrinsic Amplitude: 7.3 mV
Lead Channel Sensing Intrinsic Amplitude: 7.3 mV
Lead Channel Sensing Intrinsic Amplitude: 7.4 mV
Lead Channel Setting Pacing Amplitude: 2.5 V
Lead Channel Setting Pacing Pulse Width: 0.4 ms
Lead Channel Setting Sensing Sensitivity: 0.3 mV

## 2015-04-17 NOTE — Patient Instructions (Addendum)
Medication Instructions: - no changes  Labwork: - none  Procedures/Testing: - Your physician has requested that you have an echocardiogram in 2 weeks. Echocardiography is a painless test that uses sound waves to create images of your heart. It provides your doctor with information about the size and shape of your heart and how well your heart's chambers and valves are working. This procedure takes approximately one hour. There are no restrictions for this procedure.  Follow-Up: - Pending results of your echo  Any Additional Special Instructions Will Be Listed Below (If Applicable).

## 2015-04-17 NOTE — Progress Notes (Signed)
Patient Care Team: Dickie La, MD as PCP - General   HPI  [name] is a 55 y.o. male seen in followup for an ICD implanted in the setting of nonischemic heart disease and congestive heart failure. This was implanted for primary prevention.  He has never been shocked by his device.  He is doing pretty well. He denies CP and SOB, or edema He has sleep apnea. He has not tolerated wearing the mask.    He is looking into getting a new CPAP machine, unable to tolerate the old mask/machine. He was seen the other day in the emergency room because of his device. He has reached ERI.   Past Medical History  Diagnosis Date  . Severe hypertension   . Chest pain   . Cardiomyopathy     with borderline ejection fraction in this 55 year old male  . Arthritis   . Cataracts, bilateral     right  . Congestive heart failure ([place])   . Diabetes mellitus     does not check blood sugars at home  . Glaucoma     bilateral  . Hyperlipidemia   . Hypertension   . H/O TIA (transient ischemic attack) and stroke     with heart issues  . Implantable cardiac defibrillator -Medtronic 07/15/2013    Past Surgical History  Procedure Laterality Date  . Cardiac catheterization  10/24/2003    EF of 45-50%  . Cardiac defibrillator placement  2008  . Cataract extraction  2012    right  . Shoulder surgery  2012    left  . Knee surgery Right 2000    Current Outpatient Prescriptions  Medication Sig Dispense Refill  . amLODipine (NORVASC) 10 MG tablet Take 10 mg by mouth daily.      Marland Kitchen aspirin 81 MG tablet Take 81 mg by mouth daily.    . carvedilol (COREG) 25 MG tablet Take 1 tablet (25 mg total) by mouth 2 (two) times daily with a meal.    . EPINEPHrine 0.3 mg/0.3 mL IJ SOAJ injection Inject 0.3 mg into the muscle as needed (Anaphylaxis).    . furosemide (LASIX) 40 MG tablet Take 40 mg by mouth daily as needed for fluid. Reported on 03/22/2015    . glipiZIDE (GLUCOTROL) 5 MG tablet Take 1  tablet (5 mg total) by mouth 2 (two) times daily before a meal. 180 tablet 3  . HYDROcodone-acetaminophen (NORCO/VICODIN) 5-325 MG tablet Take 1-2 tablets by mouth 2 (two) times daily as needed for moderate pain. 120 tablet 0  . isosorbide mononitrate (ISMO,MONOKET) 20 MG tablet Take 1 tablet (20 mg total) by mouth 2 (two) times daily. 180 tablet 3  . metFORMIN (GLUCOPHAGE) 1000 MG tablet Take 1 tablet (1,000 mg total) by mouth 2 (two) times daily with a meal. 280 tablet 3  . pravastatin (PRAVACHOL) 80 MG tablet Take 80 mg by mouth at bedtime.     . sacubitril-valsartan (ENTRESTO) 49-51 MG Take 1 tablet by mouth 2 (two) times daily.    Marland Kitchen spironolactone (ALDACTONE) 25 MG tablet Take 25 mg by mouth 2 (two) times daily. Per Dr. Glennon Mac    . [DISCONTINUED] lisinopril (PRINIVIL,ZESTRIL) 40 MG tablet Take 40 mg by mouth 2 (two) times daily.       No current facility-administered medications for this visit.    Allergies  Allergen Reactions  . Bee Pollen Anaphylaxis, Itching and Swelling    Review of Systems negative except from HPI and PMH  Physical Exam BP 138/80 mmHg  Pulse 74  Ht 5\' 2"  (1.575 m)  Wt 331 lb 6.4 oz (150.322 kg)  BMI 60.60 kg/m2 Well developed and morbidly obese in no acute distress Pt has very poor vision secondary to his glaucoma HENT normal E scleral and icterus clear Neck Supple JVP flat; carotids brisk and full Clear to ausculation Device pocket well healed; without hematoma or erythema.  There is no tethering  Regular rate and rhythm, no murmurs gallops or rub Soft with active bowel sounds No clubbing cyanosis none Edema none Alert and oriented, grossly normal motor and sensory function Skin Warm and Dry  ECG demonstrates sinus rhythm at 67 PR 176, QRS 109  Assessment and  Plan:  Nonischemic cardiomyopathy appears compensated, on good therapy  Hypertension Was better at his last check, follow   Morbid obesity   Obstructive sleep  apnea  Implantable defibrillator-Medtronic  The patient's device was interrogated today.  The information was reviewed. No changes were made in the programming.    Discussion with the patient and his wife options for NICM to go forward with the generator change or not, we will check an echo doppler first to help aid in the decision, if his LV remains depressed the patient and wife sound more inclined to have the gen change done.  We will plan for this in 4-5 weeks, the echo in 2-3 weeks.

## 2015-04-20 ENCOUNTER — Encounter: Payer: Self-pay | Admitting: Internal Medicine

## 2015-04-23 ENCOUNTER — Encounter: Payer: Self-pay | Admitting: Internal Medicine

## 2015-05-01 ENCOUNTER — Ambulatory Visit (HOSPITAL_COMMUNITY): Payer: Medicare Other | Attending: Cardiovascular Disease

## 2015-05-01 ENCOUNTER — Other Ambulatory Visit: Payer: Self-pay

## 2015-05-01 DIAGNOSIS — I5022 Chronic systolic (congestive) heart failure: Secondary | ICD-10-CM | POA: Diagnosis not present

## 2015-05-01 DIAGNOSIS — I509 Heart failure, unspecified: Secondary | ICD-10-CM | POA: Diagnosis present

## 2015-05-01 DIAGNOSIS — E785 Hyperlipidemia, unspecified: Secondary | ICD-10-CM | POA: Insufficient documentation

## 2015-05-01 DIAGNOSIS — E119 Type 2 diabetes mellitus without complications: Secondary | ICD-10-CM | POA: Insufficient documentation

## 2015-05-01 DIAGNOSIS — I10 Essential (primary) hypertension: Secondary | ICD-10-CM | POA: Diagnosis not present

## 2015-05-01 DIAGNOSIS — I517 Cardiomegaly: Secondary | ICD-10-CM | POA: Diagnosis not present

## 2015-05-02 DIAGNOSIS — I472 Ventricular tachycardia: Secondary | ICD-10-CM | POA: Diagnosis not present

## 2015-05-02 DIAGNOSIS — I420 Dilated cardiomyopathy: Secondary | ICD-10-CM | POA: Diagnosis not present

## 2015-05-02 DIAGNOSIS — E785 Hyperlipidemia, unspecified: Secondary | ICD-10-CM | POA: Diagnosis not present

## 2015-05-02 DIAGNOSIS — E669 Obesity, unspecified: Secondary | ICD-10-CM | POA: Diagnosis not present

## 2015-05-02 DIAGNOSIS — E119 Type 2 diabetes mellitus without complications: Secondary | ICD-10-CM | POA: Diagnosis not present

## 2015-05-02 DIAGNOSIS — I119 Hypertensive heart disease without heart failure: Secondary | ICD-10-CM | POA: Diagnosis not present

## 2015-05-03 ENCOUNTER — Telehealth: Payer: Self-pay | Admitting: Internal Medicine

## 2015-05-03 NOTE — Telephone Encounter (Signed)
Please review the patient's echo in your in-basket. Thanks!

## 2015-05-03 NOTE — Telephone Encounter (Signed)
New problem   Pt wanting results of his echo he had done 1.24.17.

## 2015-05-08 NOTE — Telephone Encounter (Signed)
Pt is calling back to get the results to his Echo.

## 2015-05-08 NOTE — Telephone Encounter (Signed)
Attempted to call the patient regarding echo results. Phone rings and cuts off- did this x 2 attempts. Will call back later.

## 2015-05-09 NOTE — Telephone Encounter (Signed)
I called and spoke with the patient. He is aware of his echo results. He will think about his decision and call me back next week to let me know. I advised him if he wants to follow up with Dr. Caryl Comes in the office to discuss I will be glad to schedule him. He will let me know.

## 2015-05-14 ENCOUNTER — Telehealth: Payer: Self-pay | Admitting: *Deleted

## 2015-05-14 DIAGNOSIS — I428 Other cardiomyopathies: Secondary | ICD-10-CM

## 2015-05-14 DIAGNOSIS — Z01812 Encounter for preprocedural laboratory examination: Secondary | ICD-10-CM

## 2015-05-14 NOTE — Telephone Encounter (Signed)
Patient's wife called in stating that pt does want his ICD replaced.   Wife states that pt is unable to do the procedure on 05/23/15. Wife also states that pt would prefer a morning procedure.  Will defer scheduling to Alvis Lemmings, RN.   Wife aware that she should receive a call tomorrow about scheduling the procedure.

## 2015-05-15 ENCOUNTER — Encounter: Payer: Self-pay | Admitting: *Deleted

## 2015-05-15 NOTE — Telephone Encounter (Signed)
I called and spoke with the patient's wife. He would like to have his generator replaced on his ICD. Dr. Caryl Comes aware and is ok with this. The patient is scheduled for 05/30/15. Verbal instructions given to the patient's wife. I will mail a copy of instructions to them as well. Mailing address confirmed. Labs on 2/16.

## 2015-05-24 ENCOUNTER — Other Ambulatory Visit (INDEPENDENT_AMBULATORY_CARE_PROVIDER_SITE_OTHER): Payer: Medicare Other | Admitting: *Deleted

## 2015-05-24 DIAGNOSIS — I429 Cardiomyopathy, unspecified: Secondary | ICD-10-CM | POA: Diagnosis not present

## 2015-05-24 DIAGNOSIS — Z01812 Encounter for preprocedural laboratory examination: Secondary | ICD-10-CM | POA: Diagnosis not present

## 2015-05-24 DIAGNOSIS — I428 Other cardiomyopathies: Secondary | ICD-10-CM

## 2015-05-24 LAB — CBC WITH DIFFERENTIAL/PLATELET
Basophils Absolute: 0 10*3/uL (ref 0.0–0.1)
Basophils Relative: 0 % (ref 0–1)
Eosinophils Absolute: 0.4 10*3/uL (ref 0.0–0.7)
Eosinophils Relative: 5 % (ref 0–5)
HCT: 36.1 % — ABNORMAL LOW (ref 39.0–52.0)
Hemoglobin: 11.8 g/dL — ABNORMAL LOW (ref 13.0–17.0)
Lymphocytes Relative: 35 % (ref 12–46)
Lymphs Abs: 2.6 10*3/uL (ref 0.7–4.0)
MCH: 27.8 pg (ref 26.0–34.0)
MCHC: 32.7 g/dL (ref 30.0–36.0)
MCV: 85.1 fL (ref 78.0–100.0)
MPV: 9.5 fL (ref 8.6–12.4)
Monocytes Absolute: 0.6 10*3/uL (ref 0.1–1.0)
Monocytes Relative: 8 % (ref 3–12)
Neutro Abs: 3.9 10*3/uL (ref 1.7–7.7)
Neutrophils Relative %: 52 % (ref 43–77)
Platelets: 264 10*3/uL (ref 150–400)
RBC: 4.24 MIL/uL (ref 4.22–5.81)
RDW: 14.4 % (ref 11.5–15.5)
WBC: 7.5 10*3/uL (ref 4.0–10.5)

## 2015-05-24 LAB — BASIC METABOLIC PANEL
BUN: 18 mg/dL (ref 7–25)
CO2: 27 mmol/L (ref 20–31)
Calcium: 8.9 mg/dL (ref 8.6–10.3)
Chloride: 104 mmol/L (ref 98–110)
Creat: 1.4 mg/dL — ABNORMAL HIGH (ref 0.70–1.33)
Glucose, Bld: 205 mg/dL — ABNORMAL HIGH (ref 65–99)
Potassium: 4.2 mmol/L (ref 3.5–5.3)
Sodium: 139 mmol/L (ref 135–146)

## 2015-05-24 LAB — PROTIME-INR
INR: 1 (ref <1.50)
Prothrombin Time: 13.3 seconds (ref 11.6–15.2)

## 2015-05-25 ENCOUNTER — Other Ambulatory Visit: Payer: Medicare Other

## 2015-05-30 ENCOUNTER — Encounter (HOSPITAL_COMMUNITY)
Admission: RE | Disposition: A | Discharge status: Home / Self Care | Payer: Self-pay | Source: Ambulatory Visit | Attending: Internal Medicine

## 2015-05-30 ENCOUNTER — Ambulatory Visit (HOSPITAL_COMMUNITY)
Admission: RE | Admit: 2015-05-30 | Discharge: 2015-05-30 | Disposition: A | Discharge status: Home / Self Care | Payer: Medicare Other | Source: Ambulatory Visit | Attending: Internal Medicine | Admitting: Internal Medicine

## 2015-05-30 DIAGNOSIS — Z6841 Body Mass Index (BMI) 40.0 and over, adult: Secondary | ICD-10-CM | POA: Insufficient documentation

## 2015-05-30 DIAGNOSIS — Z8673 Personal history of transient ischemic attack (TIA), and cerebral infarction without residual deficits: Secondary | ICD-10-CM | POA: Insufficient documentation

## 2015-05-30 DIAGNOSIS — G4733 Obstructive sleep apnea (adult) (pediatric): Secondary | ICD-10-CM | POA: Insufficient documentation

## 2015-05-30 DIAGNOSIS — E119 Type 2 diabetes mellitus without complications: Secondary | ICD-10-CM | POA: Diagnosis not present

## 2015-05-30 DIAGNOSIS — I255 Ischemic cardiomyopathy: Secondary | ICD-10-CM | POA: Diagnosis not present

## 2015-05-30 DIAGNOSIS — I110 Hypertensive heart disease with heart failure: Secondary | ICD-10-CM | POA: Insufficient documentation

## 2015-05-30 DIAGNOSIS — I429 Cardiomyopathy, unspecified: Secondary | ICD-10-CM | POA: Insufficient documentation

## 2015-05-30 DIAGNOSIS — Z7984 Long term (current) use of oral hypoglycemic drugs: Secondary | ICD-10-CM | POA: Insufficient documentation

## 2015-05-30 DIAGNOSIS — I5022 Chronic systolic (congestive) heart failure: Secondary | ICD-10-CM | POA: Insufficient documentation

## 2015-05-30 DIAGNOSIS — E785 Hyperlipidemia, unspecified: Secondary | ICD-10-CM | POA: Insufficient documentation

## 2015-05-30 DIAGNOSIS — I428 Other cardiomyopathies: Secondary | ICD-10-CM

## 2015-05-30 DIAGNOSIS — Z4502 Encounter for adjustment and management of automatic implantable cardiac defibrillator: Secondary | ICD-10-CM | POA: Insufficient documentation

## 2015-05-30 DIAGNOSIS — Z7982 Long term (current) use of aspirin: Secondary | ICD-10-CM | POA: Diagnosis not present

## 2015-05-30 DIAGNOSIS — Z95810 Presence of automatic (implantable) cardiac defibrillator: Secondary | ICD-10-CM | POA: Diagnosis present

## 2015-05-30 DIAGNOSIS — M1990 Unspecified osteoarthritis, unspecified site: Secondary | ICD-10-CM | POA: Diagnosis not present

## 2015-05-30 HISTORY — DX: Other cardiomyopathies: I42.8

## 2015-05-30 HISTORY — PX: EP IMPLANTABLE DEVICE: SHX172B

## 2015-05-30 LAB — SURGICAL PCR SCREEN
MRSA, PCR: NEGATIVE
Staphylococcus aureus: NEGATIVE

## 2015-05-30 LAB — GLUCOSE, CAPILLARY: Glucose-Capillary: 156 mg/dL — ABNORMAL HIGH (ref 65–99)

## 2015-05-30 SURGERY — ICD/BIV ICD GENERATOR CHANGEOUT

## 2015-05-30 MED ORDER — LIDOCAINE HCL (PF) 1 % IJ SOLN
INTRAMUSCULAR | Status: AC
  Filled 2015-05-30: qty 30

## 2015-05-30 MED ORDER — MUPIROCIN 2 % EX OINT
TOPICAL_OINTMENT | CUTANEOUS | Status: AC
  Administered 2015-05-30: 1 via NASAL
  Filled 2015-05-30: qty 22

## 2015-05-30 MED ORDER — CEFAZOLIN SODIUM 10 G IJ SOLR
3.0000 g | INTRAMUSCULAR | Status: AC
  Administered 2015-05-30: 3 g via INTRAVENOUS
  Filled 2015-05-30: qty 3000

## 2015-05-30 MED ORDER — SODIUM CHLORIDE 0.9 % IV SOLN: INTRAVENOUS | Status: AC

## 2015-05-30 MED ORDER — SODIUM CHLORIDE 0.9 % IR SOLN
80.0000 mg | Status: AC
  Administered 2015-05-30: 80 mg

## 2015-05-30 MED ORDER — MIDAZOLAM HCL 5 MG/5ML IJ SOLN
INTRAMUSCULAR | Status: AC
  Filled 2015-05-30: qty 5

## 2015-05-30 MED ORDER — SODIUM CHLORIDE 0.9 % IR SOLN
Status: AC
  Filled 2015-05-30: qty 2

## 2015-05-30 MED ORDER — SODIUM CHLORIDE 0.9 % IV SOLN
INTRAVENOUS | Status: DC
  Administered 2015-05-30: 10:00:00 via INTRAVENOUS

## 2015-05-30 MED ORDER — ONDANSETRON HCL 4 MG/2ML IJ SOLN: 4.0000 mg | Freq: Four times a day (QID) | INTRAMUSCULAR | Status: DC | PRN

## 2015-05-30 MED ORDER — FENTANYL CITRATE (PF) 100 MCG/2ML IJ SOLN
INTRAMUSCULAR | Status: AC
  Filled 2015-05-30: qty 2

## 2015-05-30 MED ORDER — CHLORHEXIDINE GLUCONATE 4 % EX LIQD
60.0000 mL | Freq: Once | CUTANEOUS | Status: DC
  Filled 2015-05-30: qty 60

## 2015-05-30 MED ORDER — MUPIROCIN 2 % EX OINT
1.0000 "application " | TOPICAL_OINTMENT | Freq: Once | CUTANEOUS | Status: DC
  Filled 2015-05-30: qty 22

## 2015-05-30 MED ORDER — ACETAMINOPHEN 325 MG PO TABS
325.0000 mg | ORAL_TABLET | ORAL | Status: DC | PRN
  Filled 2015-05-30: qty 2

## 2015-05-30 MED ORDER — LIDOCAINE HCL (PF) 1 % IJ SOLN
INTRAMUSCULAR | Status: DC | PRN
  Administered 2015-05-30: 54 mL

## 2015-05-30 SURGICAL SUPPLY — 5 items
CABLE SURGICAL S-101-97-12 (CABLE) ×1 IMPLANT
HEMOSTAT SURGICEL 2X4 FIBR (HEMOSTASIS) IMPLANT
ICD EVERA XT VR DVBB1D1 (ICD Generator) ×1 IMPLANT
PAD DEFIB LIFELINK (PAD) ×1 IMPLANT
TRAY PACEMAKER INSERTION (PACKS) ×1 IMPLANT

## 2015-05-30 NOTE — Discharge Instructions (Signed)
Pacemaker Battery Change °A pacemaker battery usually lasts 4 to 12 years. Once or twice per year, you will be asked to visit your health care provider to have a full evaluation of your pacemaker. When a battery needs to be replaced, the entire pacemaker is replaced so that you can benefit from new circuitry and any new features that have been added to pacemakers. Most often, this procedure is very simple because the leads are already in place.  °There are many things that affect how long a pacemaker battery will last, including:  °· The age of the pacemaker.   °· The number of leads (1, 2, or 3).   °· The pacemaker work load. If the pacemaker is helping the heart more often, the battery will not last as long as it would if the pacemaker did not need to help the heart.   °· Power (voltage) settings.  °LET YOUR HEALTH CARE PROVIDER KNOW ABOUT:  °· Any allergies you have.   °· All medicines you are taking, including vitamins, herbs, eye drops, creams, and over-the-counter medicines.   °· Previous problems you or members of your family have had with the use of anesthetics.   °· Any blood disorders you have.   °· Previous surgeries you have had, especially since your last pacemaker placement.   °· Medical conditions you have.   °· Possibility of pregnancy, if this applies. °· Symptoms of chest pain, trouble breathing, palpitations, light-headedness, or feelings of an abnormal or irregular heartbeat. °RISKS AND COMPLICATIONS  °Generally, this is a safe procedure. However, as with any procedure, problems can occur and include:  °· Bleeding.   °· Bruising of the skin around where the incision was made.   °· Pain at the incision site.   °· Pulling apart of the skin at the incision site.   °· Infection.   °· Allergic reaction to anesthetics or other medicines used during the procedure.   °People with diabetes may have a temporary increase in their blood sugar after any surgical procedure.  °BEFORE THE PROCEDURE  °· Wash all  of the skin around the area of the chest where the pacemaker is located.   °· Ask your health care provider for help with any medicine adjustments before the pacemaker is replaced.   °· Do not eat or drink anything after midnight on the night before the procedure or as directed by your health care provider. °· Ask your health care provider if you can take a sip of water with any approved medicines the morning of the procedure. °PROCEDURE  °· After giving medicine to numb the skin (local anesthetic), your health care provider will make a cut to reopen the pocket holding the pacemaker.   °· The old pacemaker will be disconnected from its leads.   °· The leads will be tested.   °· If needed, the leads will be replaced. If the leads are functioning properly, the new pacemaker may be connected to the existing leads. °· A heart monitor and the pacemaker programmer will be used to make sure that the new pacemaker is working properly. °· The incision site will then be closed. A dressing will be placed over the pacemaker site. The dressing will be removed 24-48 hours afterward. °AFTER THE PROCEDURE  °· You will be taken to a recovery area after the new pacemaker implant is completed. Your vital signs such as blood pressure, heart rate, breathing, and oxygen levels will be monitored. °· Your health care provider will tell you when you will need to next test your pacemaker or when to return to the office for follow-up   for removal of stitches.   This information is not intended to replace advice given to you by your health care provider. Make sure you discuss any questions you have with your health care provider.   Document Released: 07/02/2006 Document Revised: 04/14/2014 Document Reviewed: 10/06/2012 Elsevier Interactive Patient Education [employer].

## 2015-05-30 NOTE — Interval H&P Note (Signed)
ICD Criteria  Current LVEF:40-45%. Within 12 months prior to implant  yes   Heart failure history: Yes, Class III  Cardiomyopathy history: Yes, Non-Ischemic Cardiomyopathy.  Atrial Fibrillation/Atrial Flutter: No.  Ventricular tachycardia history: No.  Cardiac arrest history: No.  History of syndromes with risk of sudden death: No.  Previous ICD: Yes, Reason for ICD:  Primary prevention.  Current ICD indication: Primary  PPM indication: No.   Class I or II Bradycardia indication present: No  Beta Blocker therapy for 3 or more months: Yes, prescribed.   Ace Inhibitor/ARB therapy for 3 or more months: Yes, prescribed.   History and Physical Interval Note:  05/30/2015 12:09 PM  [name]  has presented today for surgery, with the diagnosis of cm/eri  The various methods of treatment have been discussed with the patient and family. After consideration of risks, benefits and other options for treatment, the patient has consented to  Procedure(s): ICD [employer] (N/A) as a surgical intervention .  The patient's history has been reviewed, patient examined, no change in status, stable for surgery.  I have reviewed the patient's chart and labs.  Questions were answered to the patient's satisfaction.     [name]

## 2015-05-30 NOTE — H&P (Signed)
Patient Care Team: Dickie La, MD as PCP - General   HPI  [name] is a 55 y.o. male Admitted for replacement of an ICD implanted in the setting of nonischemic heart disease and congestive heart failure. This was implanted for primary prevention. He has never been shocked by his device.  He has abeen  doing pretty well. He denies CP and SOB, or edema He has sleep apnea. He has not tolerated wearing the mask.   He is looking into getting a new CPAP machine, unable to tolerate the old mask/machine.     Past Medical History  Diagnosis Date  . Severe hypertension   . Chest pain   . Cardiomyopathy     with borderline ejection fraction in this 55 year old male  . Arthritis   . Cataracts, bilateral     right  . Congestive heart failure ([place])   . Diabetes mellitus     does not check blood sugars at home  . Glaucoma     bilateral  . Hyperlipidemia   . Hypertension   . H/O TIA (transient ischemic attack) and stroke     with heart issues  . Implantable cardiac defibrillator -Medtronic     02/19/07, Dr. Lovena Le    Past Surgical History  Procedure Laterality Date  . Cardiac catheterization  10/24/2003    EF of 45-50%  . Cardiac defibrillator placement  2008  . Cataract extraction  2012    right  . Shoulder surgery  2012    left  . Knee surgery Right 2000    Current Facility-Administered Medications  Medication Dose Route Frequency Provider Last Rate Last Dose  . 0.9 %  sodium chloride infusion   Intravenous Continuous Patsey Berthold, NP 50 mL/hr at 05/30/15 1011    . ceFAZolin (ANCEF) 3 g in dextrose 5 % 50 mL IVPB  3 g Intravenous On Call Amber Sena Slate, NP      . chlorhexidine (HIBICLENS) 4 % liquid 4 application  60 mL Topical Once Amber Sena Slate, NP      . gentamicin (GARAMYCIN) 80 mg in sodium chloride irrigation 0.9 % 500 mL irrigation  80 mg Irrigation On Call Patsey Berthold, NP      . mupirocin ointment (BACTROBAN) 2 % 1 application  1 application  Topical Once [name], MD        Allergies  Allergen Reactions  . Bee Pollen Anaphylaxis, Itching and Swelling     Medication List    ASK your doctor about these medications        amLODipine 10 MG tablet  Commonly known as:  NORVASC  Take 10 mg by mouth daily.     aspirin 81 MG tablet  Take 81 mg by mouth daily.     COREG 25 MG tablet  Generic drug:  carvedilol  Take 1 tablet (25 mg total) by mouth 2 (two) times daily with a meal.     ENTRESTO 49-51 MG  Generic drug:  sacubitril-valsartan  Take 1 tablet by mouth 2 (two) times daily.     EPINEPHrine 0.3 mg/0.3 mL Soaj injection  Commonly known as:  EPI-PEN  Inject 0.3 mg into the muscle as needed (Anaphylaxis).     furosemide 40 MG tablet  Commonly known as:  LASIX  Take 40 mg by mouth daily as needed for fluid. Reported on 03/22/2015     glipiZIDE 5 MG tablet  Commonly known as:  GLUCOTROL  Take 1 tablet (5 mg total) by mouth 2 (two) times daily before a meal.     HYDROcodone-acetaminophen 5-325 MG tablet  Commonly known as:  NORCO/VICODIN  Take 1-2 tablets by mouth 2 (two) times daily as needed for moderate pain.     isosorbide mononitrate 20 MG tablet  Commonly known as:  ISMO,MONOKET  Take 1 tablet (20 mg total) by mouth 2 (two) times daily.     metFORMIN 1000 MG tablet  Commonly known as:  GLUCOPHAGE  Take 1 tablet (1,000 mg total) by mouth 2 (two) times daily with a meal.     pravastatin 80 MG tablet  Commonly known as:  PRAVACHOL  Take 80 mg by mouth at bedtime.     spironolactone 25 MG tablet  Commonly known as:  ALDACTONE  Take 25 mg by mouth 2 (two) times daily.       Family History  Problem Relation Age of Onset  . Colon cancer Father   . Esophageal cancer Neg Hx   . Rectal cancer Neg Hx   . Stomach cancer Neg Hx    Social History   Social History  . Marital Status: Legally Separated    Spouse Name: N/A  . Number of Children: N/A  . Years of Education: N/A   Occupational  History  . Not on file.   Social History Main Topics  . Smoking status: Never Smoker   . Smokeless tobacco: Never Used  . Alcohol Use: No  . Drug Use: No  . Sexual Activity: Not on file   Other Topics Concern  . Not on file   Social History Narrative      Review of Systems negative except from HPI and PMH  Physical Exam BP 123/84 mmHg  Pulse 73  Temp(Src) 98.4 F (36.9 C) (Oral)  Resp 18  Ht 5\' 2"  (1.575 m)  Wt 321 lb (145.605 kg)  BMI 58.70 kg/m2  SpO2 96% Well developed and well nourished in no acute distress HENT normal E scleral and icterus clear Neck Supple JVP flat; carotids brisk and full Clear to ausculation  Regular rate and rhythm, 2/6 MSoft with active bowel sounds No clubbing cyanosis 2+ Edema Alert and oriented, grossly normal motor and sensory function Skin Warm and Dry    Assessment and  Plan  NCIM with inteval impriovement 40-45%  CHF CHRONIC SYSTOLIC  ICD Medtronic atERI  Morbidly obese   OSA not yet treated   Pt's EF is modestly improved  After discussion with his family, he has decided to pursue gneerator replacement.  This is in accordance with M recommendation from [facility]  We have reviewed the benefits and risks of generator replacement.  These include but are not limited to lead fracture and infection.  The patient understands, agrees and is willing to proceed.

## 2015-05-31 ENCOUNTER — Telehealth: Payer: Self-pay | Admitting: Internal Medicine

## 2015-05-31 ENCOUNTER — Encounter (HOSPITAL_COMMUNITY): Payer: Self-pay | Admitting: Internal Medicine

## 2015-05-31 NOTE — Telephone Encounter (Signed)
New Message:  Pt 's wife called in with a few questions about how to set up the remote transmitter. Please f/u with her

## 2015-05-31 NOTE — Telephone Encounter (Signed)
Spoke w/ pt wife instructed her how to use home monitor. Pt  Wife verbalized understanding.

## 2015-06-06 ENCOUNTER — Other Ambulatory Visit: Payer: Self-pay | Admitting: Family Medicine

## 2015-06-06 MED ORDER — METRONIDAZOLE 500 MG PO TABS: 500.0000 mg | ORAL_TABLET | Freq: Two times a day (BID) | ORAL | Status: DC

## 2015-06-06 NOTE — Progress Notes (Signed)
Wife dx [name] so will treat him as well

## 2015-06-12 ENCOUNTER — Other Ambulatory Visit: Payer: Self-pay | Admitting: Family Medicine

## 2015-06-12 NOTE — Telephone Encounter (Signed)
Pt is wife is calling because she said that her husband is out of his knee medication and was told that Dr. Nori Riis would leave this up front for pick up. I am guessing that this is his hydrocodone. jw

## 2015-06-13 MED ORDER — HYDROCODONE-ACETAMINOPHEN 5-325 MG PO TABS: 1.0000 | ORAL_TABLET | Freq: Three times a day (TID) | ORAL | Status: DC | PRN

## 2015-06-13 NOTE — Telephone Encounter (Signed)
Pt is calling again because he said the last time his pain medication was filled it was for 120 qty and it used be 180 QTY and wanted to know if there was a reason for the decrease. jw

## 2015-06-13 NOTE — Telephone Encounter (Signed)
Pt is calling back to check the status of his request for his medication. He likes to pick this up at the same time while he has money. jw

## 2015-06-13 NOTE — Telephone Encounter (Signed)
Dear Dema Severin Team Sorry, I had in the chart that he received #120. At next refill I will give him #180. THANKS! [name]

## 2015-06-14 NOTE — Telephone Encounter (Signed)
Contacted pt on 06/13/15 and informed him of below. Katharina Caper, Clarisa Danser D, [place]

## 2015-06-15 MED ORDER — HYDROCODONE-ACETAMINOPHEN 5-325 MG PO TABS: 1.0000 | ORAL_TABLET | Freq: Three times a day (TID) | ORAL | Status: DC | PRN

## 2015-06-15 NOTE — Telephone Encounter (Signed)
Dear Dema Severin Team I am putting rx up front THANKS! [name]

## 2015-06-15 NOTE — Telephone Encounter (Signed)
Pts wife called back and is requesting a refill on medication.  He is not due for an appointment until March and he is going to run out by then. Enya Bureau, [name], CMA

## 2015-06-15 NOTE — Telephone Encounter (Signed)
PT informed of below. Zimmerman Rumple, Finas Delone D, CMA  

## 2015-06-18 ENCOUNTER — Ambulatory Visit (INDEPENDENT_AMBULATORY_CARE_PROVIDER_SITE_OTHER): Payer: Medicare Other | Admitting: *Deleted

## 2015-06-18 ENCOUNTER — Encounter: Payer: Self-pay | Admitting: Internal Medicine

## 2015-06-18 DIAGNOSIS — Z95810 Presence of automatic (implantable) cardiac defibrillator: Secondary | ICD-10-CM

## 2015-06-18 DIAGNOSIS — I429 Cardiomyopathy, unspecified: Secondary | ICD-10-CM | POA: Diagnosis not present

## 2015-06-18 DIAGNOSIS — I428 Other cardiomyopathies: Secondary | ICD-10-CM

## 2015-06-18 LAB — CUP PACEART INCLINIC DEVICE CHECK
Battery Remaining Longevity: 135 mo
Battery Voltage: 3.09 V
Brady Statistic RV Percent Paced: 0.01 %
Date Time Interrogation Session: 20170313153026
HighPow Impedance: 41 Ohm
HighPow Impedance: 55 Ohm
Implantable Lead Implant Date: 20081114
Implantable Lead Location: 753860
Implantable Lead Model: 6947
Lead Channel Impedance Value: 342 Ohm
Lead Channel Impedance Value: 437 Ohm
Lead Channel Pacing Threshold Amplitude: 0.5 V
Lead Channel Pacing Threshold Pulse Width: 0.4 ms
Lead Channel Sensing Intrinsic Amplitude: 7.375 mV
Lead Channel Setting Pacing Amplitude: 2.5 V
Lead Channel Setting Pacing Pulse Width: 0.4 ms
Lead Channel Setting Sensing Sensitivity: 0.3 mV

## 2015-06-18 NOTE — Progress Notes (Signed)
Wound check appointment, s/p generator change on 05/30/2015. Dermabond removed. Wound without redness or edema. Incision edges approximated, wound well healed. Normal device function. Thresholds, sensing, and impedances consistent with implant measurements. Device programmed at chronic output. Histogram distribution appropriate for patient and level of activity. No ventricular arrhythmias noted. Patient educated about wound care, arm mobility, shock plan. [facility] on 09/17/15, ROV with SK in 12 months.

## 2015-07-11 ENCOUNTER — Telehealth: Payer: Self-pay | Admitting: Family Medicine

## 2015-07-11 DIAGNOSIS — E1159 Type 2 diabetes mellitus with other circulatory complications: Secondary | ICD-10-CM

## 2015-07-11 MED ORDER — GLIPIZIDE 5 MG PO TABS: 5.0000 mg | ORAL_TABLET | Freq: Two times a day (BID) | ORAL | Status: DC

## 2015-07-11 MED ORDER — ISOSORBIDE MONONITRATE 20 MG PO TABS: 20.0000 mg | ORAL_TABLET | Freq: Two times a day (BID) | ORAL | Status: DC

## 2015-07-11 NOTE — Telephone Encounter (Signed)
Pt called and needs a refill on his Isosorbide and Glipizide sent in in 90 day qty. jw

## 2015-07-12 ENCOUNTER — Ambulatory Visit: Payer: Medicare Other

## 2015-08-09 ENCOUNTER — Ambulatory Visit: Payer: Medicare Other | Admitting: Family Medicine

## 2015-08-09 ENCOUNTER — Ambulatory Visit: Payer: Medicare Other

## 2015-08-24 DIAGNOSIS — E785 Hyperlipidemia, unspecified: Secondary | ICD-10-CM | POA: Diagnosis not present

## 2015-08-24 DIAGNOSIS — I119 Hypertensive heart disease without heart failure: Secondary | ICD-10-CM | POA: Diagnosis not present

## 2015-08-24 DIAGNOSIS — E119 Type 2 diabetes mellitus without complications: Secondary | ICD-10-CM | POA: Diagnosis not present

## 2015-08-24 DIAGNOSIS — I255 Ischemic cardiomyopathy: Secondary | ICD-10-CM | POA: Diagnosis not present

## 2015-08-24 DIAGNOSIS — I472 Ventricular tachycardia: Secondary | ICD-10-CM | POA: Diagnosis not present

## 2015-08-29 ENCOUNTER — Ambulatory Visit (INDEPENDENT_AMBULATORY_CARE_PROVIDER_SITE_OTHER): Payer: Medicare Other | Admitting: Family Medicine

## 2015-08-29 ENCOUNTER — Encounter: Payer: Self-pay | Admitting: Family Medicine

## 2015-08-29 VITALS — BP 126/70 | HR 67 | Temp 98.0°F | Ht 62.0 in | Wt 335.8 lb

## 2015-08-29 DIAGNOSIS — N183 Chronic kidney disease, stage 3 unspecified: Secondary | ICD-10-CM

## 2015-08-29 DIAGNOSIS — E118 Type 2 diabetes mellitus with unspecified complications: Secondary | ICD-10-CM

## 2015-08-29 DIAGNOSIS — E1165 Type 2 diabetes mellitus with hyperglycemia: Secondary | ICD-10-CM | POA: Diagnosis not present

## 2015-08-29 DIAGNOSIS — M170 Bilateral primary osteoarthritis of knee: Secondary | ICD-10-CM | POA: Diagnosis not present

## 2015-08-29 DIAGNOSIS — I10 Essential (primary) hypertension: Secondary | ICD-10-CM

## 2015-08-29 DIAGNOSIS — IMO0002 Reserved for concepts with insufficient information to code with codable children: Secondary | ICD-10-CM

## 2015-08-29 LAB — COMPLETE METABOLIC PANEL WITH GFR
ALT: 11 U/L (ref 9–46)
AST: 10 U/L (ref 10–35)
Albumin: 3.9 g/dL (ref 3.6–5.1)
Alkaline Phosphatase: 49 U/L (ref 40–115)
BUN: 12 mg/dL (ref 7–25)
CO2: 24 mmol/L (ref 20–31)
Calcium: 8.7 mg/dL (ref 8.6–10.3)
Chloride: 106 mmol/L (ref 98–110)
Creat: 1.19 mg/dL (ref 0.70–1.33)
GFR, Est African American: 78 mL/min (ref >=60)
GFR, Est Non African American: 68 mL/min (ref >=60)
Glucose, Bld: 127 mg/dL — ABNORMAL HIGH (ref 65–99)
Potassium: 4.3 mmol/L (ref 3.5–5.3)
Sodium: 139 mmol/L (ref 135–146)
Total Bilirubin: 0.4 mg/dL (ref 0.2–1.2)
Total Protein: 6.6 g/dL (ref 6.1–8.1)

## 2015-08-29 LAB — HEMOGLOBIN A1C
Hgb A1c MFr Bld: 7.1 % — ABNORMAL HIGH (ref <5.7)
Mean Plasma Glucose: 157 mg/dL

## 2015-08-29 LAB — LDL CHOLESTEROL, DIRECT: Direct LDL: 91 mg/dL (ref <130)

## 2015-08-29 MED ORDER — HYDROCODONE-ACETAMINOPHEN 5-325 MG PO TABS: 1.0000 | ORAL_TABLET | Freq: Three times a day (TID) | ORAL | Status: DC | PRN

## 2015-08-29 NOTE — Assessment & Plan Note (Signed)
Recheck creatinine today

## 2015-08-29 NOTE — Assessment & Plan Note (Signed)
Weight is back up. Check some lab work today. No medication changes

## 2015-08-29 NOTE — Assessment & Plan Note (Signed)
Doing pretty well with his knee pain right now under the current medication regimen. I gave him a refill. Return to clinic 3 months.

## 2015-08-29 NOTE — Assessment & Plan Note (Signed)
Check A1c today.

## 2015-08-29 NOTE — Progress Notes (Signed)
   Subjective:    Patient ID: [name], male    DOB: 11/01/1960, 55 y.o.   MRN: [id number]  HPI #1. Diabetes mellitus. No episodes of low blood sugar. Taking his medicines regular. Not checking his blood sugars. Off. Has gained weight. He thinks this is partly because he's not been exercising #2. Hypertension: Taking his medicine regular. No problems with medicines. No episodes chest pain or shortness of breath. No lower extremity swelling. No palpitations neck some  #3. Ischemic cardiomyopathy with implantable defibrillator. He just had the battery and defibrillator replaced.   #4. Left knee pain with arthritis. Her medication regimen seems to be helping.   Review of Systems He has increased his weight unexpectedly. No fever, sweats, chills. No chest pain, no lower extremity edema. No unusual shortness of breath. Exercise tolerance is stable.    Objective:   Physical Exam Vital signs reviewed. GENERAL: Well-developed, well-nourished, no acute distress. Obese CARDIOVASCULAR: Regular rate and rhythm no murmur gallop or rub LUNGS: Clear to auscultation bilaterally, no rales or wheeze. ABDOMEN: Soft positive bowel sounds NEURO: No gross focal neurological deficits. MSK: Movement of extremity x 4.         Assessment & Plan:

## 2015-09-04 ENCOUNTER — Encounter: Payer: Self-pay | Admitting: Family Medicine

## 2015-09-17 ENCOUNTER — Ambulatory Visit (INDEPENDENT_AMBULATORY_CARE_PROVIDER_SITE_OTHER): Payer: Medicare Other | Admitting: *Deleted

## 2015-09-17 ENCOUNTER — Telehealth: Payer: Self-pay | Admitting: Cardiology

## 2015-09-17 DIAGNOSIS — I429 Cardiomyopathy, unspecified: Secondary | ICD-10-CM

## 2015-09-17 DIAGNOSIS — I428 Other cardiomyopathies: Secondary | ICD-10-CM

## 2015-09-17 NOTE — Progress Notes (Signed)
Remote ICD transmission.   

## 2015-09-17 NOTE — Telephone Encounter (Signed)
Spoke with pt and reminded pt of remote transmission that is due today. Pt verbalized understanding.   

## 2015-09-20 LAB — CUP PACEART REMOTE DEVICE CHECK
Battery Remaining Longevity: 134 mo
Battery Voltage: 3.08 V
Brady Statistic RV Percent Paced: 0.01 %
Date Time Interrogation Session: 20170612160028
HighPow Impedance: 49 Ohm
HighPow Impedance: 63 Ohm
Implantable Lead Implant Date: 20081114
Implantable Lead Location: 753860
Implantable Lead Model: 6947
Lead Channel Impedance Value: 380 Ohm
Lead Channel Impedance Value: 437 Ohm
Lead Channel Pacing Threshold Amplitude: 0.5 V
Lead Channel Pacing Threshold Pulse Width: 0.4 ms
Lead Channel Sensing Intrinsic Amplitude: 7.625 mV
Lead Channel Sensing Intrinsic Amplitude: 7.625 mV
Lead Channel Setting Pacing Amplitude: 2.5 V
Lead Channel Setting Pacing Pulse Width: 0.4 ms
Lead Channel Setting Sensing Sensitivity: 0.3 mV

## 2015-09-21 DIAGNOSIS — Z961 Presence of intraocular lens: Secondary | ICD-10-CM | POA: Diagnosis not present

## 2015-09-21 DIAGNOSIS — H2512 Age-related nuclear cataract, left eye: Secondary | ICD-10-CM | POA: Diagnosis not present

## 2015-09-21 DIAGNOSIS — H401133 Primary open-angle glaucoma, bilateral, severe stage: Secondary | ICD-10-CM | POA: Diagnosis not present

## 2015-09-21 LAB — HM DIABETES EYE EXAM

## 2015-09-27 ENCOUNTER — Encounter: Payer: Self-pay | Admitting: Cardiology

## 2015-10-03 DIAGNOSIS — H2512 Age-related nuclear cataract, left eye: Secondary | ICD-10-CM | POA: Diagnosis not present

## 2015-10-03 DIAGNOSIS — H401133 Primary open-angle glaucoma, bilateral, severe stage: Secondary | ICD-10-CM | POA: Diagnosis not present

## 2015-10-03 DIAGNOSIS — Z961 Presence of intraocular lens: Secondary | ICD-10-CM | POA: Diagnosis not present

## 2015-11-07 ENCOUNTER — Ambulatory Visit (INDEPENDENT_AMBULATORY_CARE_PROVIDER_SITE_OTHER): Payer: Medicare Other | Admitting: Family Medicine

## 2015-11-07 ENCOUNTER — Encounter: Payer: Self-pay | Admitting: Family Medicine

## 2015-11-07 VITALS — BP 94/56 | HR 60 | Temp 98.3°F | Ht 62.0 in | Wt 303.8 lb

## 2015-11-07 DIAGNOSIS — Z1159 Encounter for screening for other viral diseases: Secondary | ICD-10-CM | POA: Diagnosis not present

## 2015-11-07 DIAGNOSIS — I959 Hypotension, unspecified: Secondary | ICD-10-CM

## 2015-11-07 DIAGNOSIS — E038 Other specified hypothyroidism: Secondary | ICD-10-CM | POA: Diagnosis not present

## 2015-11-07 DIAGNOSIS — H547 Unspecified visual loss: Secondary | ICD-10-CM | POA: Diagnosis not present

## 2015-11-07 DIAGNOSIS — Z114 Encounter for screening for human immunodeficiency virus [HIV]: Secondary | ICD-10-CM | POA: Diagnosis not present

## 2015-11-07 DIAGNOSIS — E1165 Type 2 diabetes mellitus with hyperglycemia: Secondary | ICD-10-CM

## 2015-11-07 DIAGNOSIS — IMO0002 Reserved for concepts with insufficient information to code with codable children: Secondary | ICD-10-CM

## 2015-11-07 DIAGNOSIS — N183 Chronic kidney disease, stage 3 unspecified: Secondary | ICD-10-CM

## 2015-11-07 DIAGNOSIS — E118 Type 2 diabetes mellitus with unspecified complications: Secondary | ICD-10-CM

## 2015-11-07 DIAGNOSIS — R42 Dizziness and giddiness: Secondary | ICD-10-CM | POA: Diagnosis not present

## 2015-11-07 DIAGNOSIS — H5410 Blindness, one eye, low vision other eye, unspecified eyes: Secondary | ICD-10-CM

## 2015-11-07 DIAGNOSIS — Z7189 Other specified counseling: Secondary | ICD-10-CM

## 2015-11-07 DIAGNOSIS — I10 Essential (primary) hypertension: Secondary | ICD-10-CM

## 2015-11-07 DIAGNOSIS — G8929 Other chronic pain: Secondary | ICD-10-CM

## 2015-11-07 DIAGNOSIS — H409 Unspecified glaucoma: Secondary | ICD-10-CM

## 2015-11-07 HISTORY — DX: Unspecified glaucoma: H40.9

## 2015-11-07 LAB — COMPLETE METABOLIC PANEL WITH GFR
ALT: 9 U/L (ref 9–46)
AST: 8 U/L — ABNORMAL LOW (ref 10–35)
Albumin: 3.9 g/dL (ref 3.6–5.1)
Alkaline Phosphatase: 44 U/L (ref 40–115)
BUN: 20 mg/dL (ref 7–25)
CO2: 17 mmol/L — ABNORMAL LOW (ref 20–31)
Calcium: 8.8 mg/dL (ref 8.6–10.3)
Chloride: 113 mmol/L — ABNORMAL HIGH (ref 98–110)
Creat: 1.64 mg/dL — ABNORMAL HIGH (ref 0.70–1.33)
GFR, Est African American: 53 mL/min — ABNORMAL LOW (ref >=60)
GFR, Est Non African American: 46 mL/min — ABNORMAL LOW (ref >=60)
Glucose, Bld: 148 mg/dL — ABNORMAL HIGH (ref 65–99)
Potassium: 4.6 mmol/L (ref 3.5–5.3)
Sodium: 142 mmol/L (ref 135–146)
Total Bilirubin: 0.5 mg/dL (ref 0.2–1.2)
Total Protein: 6.6 g/dL (ref 6.1–8.1)

## 2015-11-07 LAB — CBC
HCT: 37.6 % — ABNORMAL LOW (ref 38.5–50.0)
Hemoglobin: 12.3 g/dL — ABNORMAL LOW (ref 13.2–17.1)
MCH: 28.3 pg (ref 27.0–33.0)
MCHC: 32.7 g/dL (ref 32.0–36.0)
MCV: 86.6 fL (ref 80.0–100.0)
MPV: 9.9 fL (ref 7.5–12.5)
Platelets: 236 10*3/uL (ref 140–400)
RBC: 4.34 MIL/uL (ref 4.20–5.80)
RDW: 14.7 % (ref 11.0–15.0)
WBC: 7.1 10*3/uL (ref 3.8–10.8)

## 2015-11-07 LAB — TSH: TSH: 1.85 mIU/L (ref 0.40–4.50)

## 2015-11-07 MED ORDER — HYDROCODONE-ACETAMINOPHEN 5-325 MG PO TABS: 1.0000 | ORAL_TABLET | Freq: Three times a day (TID) | ORAL | 0 refills | Status: DC | PRN

## 2015-11-07 NOTE — Assessment & Plan Note (Signed)
Will decrease his amlodipine to 5 mg daily in light of the addition of Acetazolamide for his glaucoma. I like see him back in 3 or 4 weeks.

## 2015-11-07 NOTE — Assessment & Plan Note (Signed)
Refilled pain medication for knee

## 2015-11-07 NOTE — Progress Notes (Signed)
    CHIEF COMPLAINT / HPI:  1. Vision: Recently saw his ophthalmologist who is placed him on Acetazolamide for further reduction in intraocular pressure. Since starting that he's having a lot of distortion in taste. Is also had a lot of dizziness. He stopped driving. #2. Knee pain. Continues to be problematic. He is using a cane now to help him get around. #3. Diabetes mellitus: Not exercising. Not checking his blood sugars. Not following a diet. Is taking his medications regularly.   REVIEW OF SYSTEMS: No unusual weight change, fever, sweats, chills. See history of present illness.   OBJECTIVE: Vital signs are reviewed.   CV: Regular rate and rhythm without murmur ABDOMEN: Soft, obese, normal bowel sounds KNEE: Left. Lacks full extension by 10. Tender palpation medial joint line. No effusion FEET: Normal foot exam. Normal sensation to monofilament.  ASSESSMENT / PLAN: Please see problem oriented charting for details

## 2015-11-07 NOTE — Patient Instructions (Signed)
CUt your AMLODIPINE in half and take one half pill a day instead of a whole pill  Come in for a NURSE VISIT in about 10 days for a blood pressure check See me back in about 3-4 weeks I will send you a note about your blood work

## 2015-11-08 LAB — HEPATITIS C ANTIBODY: HCV Ab: NEGATIVE

## 2015-11-08 LAB — HIV ANTIBODY (ROUTINE TESTING W REFLEX): HIV 1&2 Ab, 4th Generation: NONREACTIVE

## 2015-11-14 ENCOUNTER — Encounter: Payer: Self-pay | Admitting: Family Medicine

## 2015-11-16 DIAGNOSIS — H401133 Primary open-angle glaucoma, bilateral, severe stage: Secondary | ICD-10-CM | POA: Diagnosis not present

## 2015-11-16 DIAGNOSIS — Z961 Presence of intraocular lens: Secondary | ICD-10-CM | POA: Diagnosis not present

## 2015-11-16 DIAGNOSIS — H2512 Age-related nuclear cataract, left eye: Secondary | ICD-10-CM | POA: Diagnosis not present

## 2015-11-17 ENCOUNTER — Other Ambulatory Visit: Payer: Self-pay | Admitting: Family Medicine

## 2015-11-20 ENCOUNTER — Other Ambulatory Visit: Payer: Self-pay | Admitting: Family Medicine

## 2015-11-21 ENCOUNTER — Encounter: Payer: Self-pay | Admitting: Family Medicine

## 2015-11-21 ENCOUNTER — Ambulatory Visit (INDEPENDENT_AMBULATORY_CARE_PROVIDER_SITE_OTHER): Payer: Medicare Other | Admitting: Family Medicine

## 2015-11-21 VITALS — BP 111/60 | HR 59 | Temp 98.2°F | Ht 62.0 in | Wt 299.0 lb

## 2015-11-21 DIAGNOSIS — N183 Chronic kidney disease, stage 3 unspecified: Secondary | ICD-10-CM

## 2015-11-21 DIAGNOSIS — E118 Type 2 diabetes mellitus with unspecified complications: Secondary | ICD-10-CM

## 2015-11-21 DIAGNOSIS — IMO0002 Reserved for concepts with insufficient information to code with codable children: Secondary | ICD-10-CM

## 2015-11-21 DIAGNOSIS — I10 Essential (primary) hypertension: Secondary | ICD-10-CM

## 2015-11-21 DIAGNOSIS — E1165 Type 2 diabetes mellitus with hyperglycemia: Secondary | ICD-10-CM | POA: Diagnosis not present

## 2015-11-21 DIAGNOSIS — Z630 Problems in relationship with spouse or partner: Secondary | ICD-10-CM | POA: Diagnosis not present

## 2015-11-21 LAB — BASIC METABOLIC PANEL WITH GFR
BUN: 16 mg/dL (ref 7–25)
CO2: 21 mmol/L (ref 20–31)
Calcium: 9.2 mg/dL (ref 8.6–10.3)
Chloride: 110 mmol/L (ref 98–110)
Creat: 1.53 mg/dL — ABNORMAL HIGH (ref 0.70–1.33)
GFR, Est African American: 58 mL/min — ABNORMAL LOW (ref >=60)
GFR, Est Non African American: 50 mL/min — ABNORMAL LOW (ref >=60)
Glucose, Bld: 136 mg/dL — ABNORMAL HIGH (ref 65–99)
Potassium: 4.5 mmol/L (ref 3.5–5.3)
Sodium: 141 mmol/L (ref 135–146)

## 2015-11-21 LAB — POCT GLYCOSYLATED HEMOGLOBIN (HGB A1C): Hemoglobin A1C: 6.1

## 2015-11-21 MED ORDER — METFORMIN HCL 1000 MG PO TABS: 1000.0000 mg | ORAL_TABLET | Freq: Two times a day (BID) | ORAL | 6 refills | Status: DC

## 2015-11-21 MED ORDER — HYDROCODONE-ACETAMINOPHEN 5-325 MG PO TABS: 1.0000 | ORAL_TABLET | Freq: Three times a day (TID) | ORAL | 0 refills | Status: DC | PRN

## 2015-11-21 NOTE — Patient Instructions (Addendum)
STOP your amlodipine for right now.   Let me see you 2 weeks or so after your eye surgery and we will recheck you blood pressure. Once you are off the eye medicine we may need to go back on the amlodipine I sent in a refill on your metformin  I will send you a note about your labs
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
## 2015-11-22 NOTE — Assessment & Plan Note (Signed)
We'll continue to hold his amlodipine. I like to see him about 2 weeks after he has his surgery as he will be off the carbonic anhydrase inhibitor at that time and we need to reassess his blood pressure then.

## 2015-11-22 NOTE — Assessment & Plan Note (Signed)
Recheck his kidney function today.

## 2015-11-23 DIAGNOSIS — E669 Obesity, unspecified: Secondary | ICD-10-CM | POA: Diagnosis not present

## 2015-11-23 DIAGNOSIS — I119 Hypertensive heart disease without heart failure: Secondary | ICD-10-CM | POA: Diagnosis not present

## 2015-11-23 DIAGNOSIS — E119 Type 2 diabetes mellitus without complications: Secondary | ICD-10-CM | POA: Diagnosis not present

## 2015-11-23 DIAGNOSIS — E785 Hyperlipidemia, unspecified: Secondary | ICD-10-CM | POA: Diagnosis not present

## 2015-11-23 DIAGNOSIS — I420 Dilated cardiomyopathy: Secondary | ICD-10-CM | POA: Diagnosis not present

## 2015-12-04 ENCOUNTER — Encounter: Payer: Self-pay | Admitting: Family Medicine

## 2015-12-17 ENCOUNTER — Ambulatory Visit (INDEPENDENT_AMBULATORY_CARE_PROVIDER_SITE_OTHER): Payer: Medicare Other | Admitting: *Deleted

## 2015-12-17 ENCOUNTER — Telehealth: Payer: Self-pay | Admitting: Cardiology

## 2015-12-17 DIAGNOSIS — I429 Cardiomyopathy, unspecified: Secondary | ICD-10-CM | POA: Diagnosis not present

## 2015-12-17 DIAGNOSIS — I428 Other cardiomyopathies: Secondary | ICD-10-CM

## 2015-12-17 NOTE — Telephone Encounter (Signed)
LMOVM reminding pt to send remote transmission.   

## 2015-12-17 NOTE — Progress Notes (Signed)
Remote ICD transmission.   

## 2015-12-20 ENCOUNTER — Encounter: Payer: Self-pay | Admitting: Cardiology

## 2015-12-20 LAB — CUP PACEART REMOTE DEVICE CHECK
Battery Remaining Longevity: 133 mo
Battery Voltage: 3.05 V
Brady Statistic RV Percent Paced: 0.03 %
Date Time Interrogation Session: 20170911164954
HighPow Impedance: 41 Ohm
HighPow Impedance: 50 Ohm
Implantable Lead Implant Date: 20081114
Implantable Lead Location: 753860
Implantable Lead Model: 6947
Lead Channel Impedance Value: 380 Ohm
Lead Channel Impedance Value: 456 Ohm
Lead Channel Pacing Threshold Amplitude: 0.5 V
Lead Channel Pacing Threshold Pulse Width: 0.4 ms
Lead Channel Sensing Intrinsic Amplitude: 6.25 mV
Lead Channel Sensing Intrinsic Amplitude: 6.25 mV
Lead Channel Setting Pacing Amplitude: 2.5 V
Lead Channel Setting Pacing Pulse Width: 0.4 ms
Lead Channel Setting Sensing Sensitivity: 0.3 mV

## 2016-02-06 ENCOUNTER — Telehealth: Payer: Self-pay

## 2016-02-06 NOTE — Telephone Encounter (Signed)
Referred to ICM clinic by Ria Clock, device RN/Dr Caryl Comes.  Call to patient and explained ICM program and he agreed to monthly ICM follow up.  Provided ICM direct number and encouraged to call for any fluid symptoms.  Explained monitor will send scheduled transmission overnight and if not received will call.   Patient gave verbal permission to leave detailed message on either cell phone or home phone.  1st ICM remote scheduled for 02/21/2016.

## 2016-02-13 ENCOUNTER — Ambulatory Visit (INDEPENDENT_AMBULATORY_CARE_PROVIDER_SITE_OTHER): Payer: Medicare Other | Admitting: Family Medicine

## 2016-02-13 ENCOUNTER — Encounter: Payer: Self-pay | Admitting: Family Medicine

## 2016-02-13 VITALS — BP 101/64 | HR 68 | Temp 97.8°F | Ht 62.0 in | Wt 293.4 lb

## 2016-02-13 DIAGNOSIS — E118 Type 2 diabetes mellitus with unspecified complications: Secondary | ICD-10-CM

## 2016-02-13 DIAGNOSIS — Z23 Encounter for immunization: Secondary | ICD-10-CM

## 2016-02-13 DIAGNOSIS — M170 Bilateral primary osteoarthritis of knee: Secondary | ICD-10-CM

## 2016-02-13 DIAGNOSIS — I10 Essential (primary) hypertension: Secondary | ICD-10-CM | POA: Diagnosis not present

## 2016-02-13 DIAGNOSIS — E1165 Type 2 diabetes mellitus with hyperglycemia: Secondary | ICD-10-CM

## 2016-02-13 DIAGNOSIS — H5410 Blindness, one eye, low vision other eye, unspecified eyes: Secondary | ICD-10-CM | POA: Diagnosis not present

## 2016-02-13 DIAGNOSIS — IMO0002 Reserved for concepts with insufficient information to code with codable children: Secondary | ICD-10-CM

## 2016-02-13 LAB — POCT GLYCOSYLATED HEMOGLOBIN (HGB A1C): Hemoglobin A1C: 5.9

## 2016-02-13 MED ORDER — HYDROCODONE-ACETAMINOPHEN 5-325 MG PO TABS: 1.0000 | ORAL_TABLET | Freq: Three times a day (TID) | ORAL | 0 refills | Status: DC | PRN

## 2016-02-13 MED ORDER — FUROSEMIDE 40 MG PO TABS: ORAL_TABLET | ORAL | 3 refills | Status: DC

## 2016-02-13 NOTE — Assessment & Plan Note (Addendum)
Somewhat worsening symptoms of chronic left knee pain. I don't think there's any point getting imaging right now because he would not be a candidate for surgical intervention unless it was emergent. He needs to get his issues with his eye taken care of first. Refills given

## 2016-02-13 NOTE — Assessment & Plan Note (Signed)
We talked about having him go back to DSS and see if he qualifies for Medicaid. Medicare evidently covers 80% of the surgery he cannot afford the differential.

## 2016-02-13 NOTE — Progress Notes (Signed)
    CHIEF COMPLAINT / HPI:   #1. Follow-up hypertension ischemic cardiomyopathy. We have been holding his amlodipine because he is on acetazolamide for his eye issues in his blood pressure has been running low. He is unable to get the eye surgery right now secondary to financial issues. He's pretty frustrated about this. He's not having any unusual dizziness. He's not been particularly active because his knees are bothering him so much. He's not noticed any chest pain or shortness of breath it's unusual for him when he has been active however. #2. Knee pain she seems to be getting somewhat worse. May be related to the colder weather but his pain now is pretty much a 6 out of 10 all of the time in increases to 8 out of 10 if he does any significant walking. He is tolerating pain medicine well without any signs of constipation. #3. Social stressors: In his wife are still having marital issues. They are living separately. At the moment it does not appear to be any plan to attempt reconciliation.  REVIEW OF SYSTEMS: See history of present illness above. Additional pertinent review of systems is negative for change in appetite; no sleep problems, no suicidal or homicidal ideation. Mood is somewhat depressed. Energy levels about the same. Weight is unchanged.  OBJECTIVE:  Vital signs are reviewed.  Vital signs reviewed. GENERAL: Well-developed, well-nourished, no acute distress. Obese, walking with a cane. CARDIOVASCULAR: Regular rate and rhythm no murmur gallop or rub LUNGS: Clear to auscultation bilaterally, no rales or wheeze. Somewhat decreased breath sounds in all lung fields. ABDOMEN: Soft positive bowel sounds. Obese. No masses noted exam is limited by habitus. MSK: Movement of extremity x 4. Left knee tender to palpation medial greater than lateral joint line. Small effusion. No erythema. Full extension although it is painful in the last 10-20. Lacks 20 of full flexion. GAIT:  Antalgic. EXTREMITY: No pedal edema    ASSESSMENT / PLAN: Please see problem oriented charting for details

## 2016-02-13 NOTE — Assessment & Plan Note (Signed)
I'm going to continue to hold the amlodipine as he is onacetazolamide for his eye issues and think that is depressing his blood pressure some. Is not having any symptoms of low blood pressure however. I'll see him back in 6-8 weeks.

## 2016-02-15 ENCOUNTER — Encounter: Payer: Self-pay | Admitting: Family Medicine

## 2016-02-21 ENCOUNTER — Ambulatory Visit (INDEPENDENT_AMBULATORY_CARE_PROVIDER_SITE_OTHER): Payer: Medicare Other

## 2016-02-21 DIAGNOSIS — I5022 Chronic systolic (congestive) heart failure: Secondary | ICD-10-CM | POA: Diagnosis not present

## 2016-02-21 DIAGNOSIS — Z95810 Presence of automatic (implantable) cardiac defibrillator: Secondary | ICD-10-CM | POA: Diagnosis not present

## 2016-02-22 ENCOUNTER — Telehealth: Payer: Self-pay

## 2016-02-22 NOTE — Progress Notes (Addendum)
EPIC Encounter for ICM Monitoring  Patient Name: [name] is a 55 y.o. male Date: 02/22/2016 Primary Care Physican: [name], MD Primary Cardiologist: Caryl Comes Electrophysiologist: Caryl Comes Dry Weight: unknown        Attempted ICM call and unable to reach. Left detailed message regarding transmission.  Transmission reviewed.   Thoracic impedance abnormal suggesting fluid accumulation 11/21/2015.  Labs: 11/21/2015 Creatinine 1.53, BUN 16, Potassium 4.5, Sodium 141  11/07/2015 Creatinine 1.64, BUN 20, Potassium 4.6, Sodium 142  08/29/2015 Creatinine 1.19, BUN 12, Potassium 4.3, Sodium 139  05/24/2015 Creatinine 1.40, BUN 18, Potassium 4.2, Sodium 139  04/09/2015 Creatinine 1.40, BUN 18, Potassium 5.0, Sodium 142   Recommendations: NONE - Unable to reach   Follow-up plan: ICM clinic phone appointment on 02/26/2016.  Copy of ICM check sent to device physician.   ICM trend: 02/21/2016       Rosalene Billings, RN 02/22/2016 2:57 PM

## 2016-02-22 NOTE — Telephone Encounter (Signed)
Remote ICM transmission received.  Attempted patient call and left message to return call.   

## 2016-02-22 NOTE — Progress Notes (Signed)
Patient returned call.  He denied any fluid symptoms.  Does not have a scale to weigh at home.  He reported he feels fine and visited PCP on 11/8 and no fluid noted in exam.  Confirmed he has Furosemide 40 mg prn and advised to take 1 tablet daily x 3 days and then return to prn.  Recheck fluid levels on 02/26/2016.  Provided direct ICM number.  He said the best number to reach him at is [phone].

## 2016-02-26 ENCOUNTER — Ambulatory Visit (INDEPENDENT_AMBULATORY_CARE_PROVIDER_SITE_OTHER): Payer: Medicare Other

## 2016-02-26 DIAGNOSIS — Z95810 Presence of automatic (implantable) cardiac defibrillator: Secondary | ICD-10-CM

## 2016-02-26 DIAGNOSIS — I5022 Chronic systolic (congestive) heart failure: Secondary | ICD-10-CM

## 2016-02-26 NOTE — Progress Notes (Signed)
EPIC Encounter for ICM Monitoring  Patient Name: [name] is a 55 y.o. male Date: 02/26/2016 Primary Care Physican: [name], MD Primary Cardiologist: Caryl Comes Electrophysiologist: Caryl Comes Dry Weight:    unknown                                                               Heart failure questions reviewed.  Patient stating he is doing well.   Thoracic impedance returned to normal after taking 3 days of Furosemide.  He has returned to Furosemide prn.   Labs: 11/21/2015 Creatinine 1.53, BUN 16, Potassium 4.5, Sodium 141  11/07/2015 Creatinine 1.64, BUN 20, Potassium 4.6, Sodium 142  08/29/2015 Creatinine 1.19, BUN 12, Potassium 4.3, Sodium 139  05/24/2015 Creatinine 1.40, BUN 18, Potassium 4.2, Sodium 139  04/09/2015 Creatinine 1.40, BUN 18, Potassium 5.0, Sodium 142   Recommendations: No changes.  Reminded him that holiday foods are high in sodium.  Reinforced low salt food choices and limiting fluid intake to < 2 liters per day. Encouraged to call for fluid symptoms.    Follow-up plan: ICM clinic phone appointment on 03/28/2016.  Copy of ICM check sent to device physician.   ICM trend: 02/26/2016       Rosalene Billings, RN 02/26/2016 3:38 PM

## 2016-03-07 ENCOUNTER — Telehealth: Payer: Self-pay | Admitting: Family Medicine

## 2016-03-07 DIAGNOSIS — E785 Hyperlipidemia, unspecified: Secondary | ICD-10-CM | POA: Diagnosis not present

## 2016-03-07 DIAGNOSIS — I10 Essential (primary) hypertension: Secondary | ICD-10-CM | POA: Diagnosis not present

## 2016-03-07 DIAGNOSIS — E119 Type 2 diabetes mellitus without complications: Secondary | ICD-10-CM | POA: Diagnosis not present

## 2016-03-07 DIAGNOSIS — E669 Obesity, unspecified: Secondary | ICD-10-CM | POA: Diagnosis not present

## 2016-03-07 DIAGNOSIS — I420 Dilated cardiomyopathy: Secondary | ICD-10-CM | POA: Diagnosis not present

## 2016-03-07 DIAGNOSIS — I119 Hypertensive heart disease without heart failure: Secondary | ICD-10-CM | POA: Diagnosis not present

## 2016-03-07 DIAGNOSIS — G4733 Obstructive sleep apnea (adult) (pediatric): Secondary | ICD-10-CM | POA: Diagnosis not present

## 2016-03-07 MED ORDER — LORATADINE 10 MG PO TABS: 10.0000 mg | ORAL_TABLET | Freq: Every day | ORAL | 11 refills | Status: DC | PRN

## 2016-03-07 NOTE — Telephone Encounter (Signed)
[name] L, RN  

## 2016-03-07 NOTE — Telephone Encounter (Signed)
Pt would like something called in for allergies. Pt has runny nose and sneezing. Pt uses [facility] @ [address]. Please advise. Thanks! ep

## 2016-03-28 ENCOUNTER — Ambulatory Visit (INDEPENDENT_AMBULATORY_CARE_PROVIDER_SITE_OTHER): Payer: Medicare Other | Admitting: *Deleted

## 2016-03-28 ENCOUNTER — Telehealth: Payer: Self-pay

## 2016-03-28 DIAGNOSIS — I5022 Chronic systolic (congestive) heart failure: Secondary | ICD-10-CM

## 2016-03-28 DIAGNOSIS — I428 Other cardiomyopathies: Secondary | ICD-10-CM

## 2016-03-28 DIAGNOSIS — Z95810 Presence of automatic (implantable) cardiac defibrillator: Secondary | ICD-10-CM | POA: Diagnosis not present

## 2016-03-28 LAB — CUP PACEART REMOTE DEVICE CHECK
Battery Remaining Longevity: 130 mo
Battery Voltage: 3.04 V
Brady Statistic RV Percent Paced: 0.29 %
Date Time Interrogation Session: 20171222122403
HighPow Impedance: 41 Ohm
HighPow Impedance: 51 Ohm
Implantable Lead Implant Date: 20081114
Implantable Lead Location: 753860
Implantable Lead Model: 6947
Implantable Pulse Generator Implant Date: 20170222
Lead Channel Impedance Value: 399 Ohm
Lead Channel Impedance Value: 456 Ohm
Lead Channel Pacing Threshold Amplitude: 0.5 V
Lead Channel Pacing Threshold Pulse Width: 0.4 ms
Lead Channel Sensing Intrinsic Amplitude: 6.125 mV
Lead Channel Sensing Intrinsic Amplitude: 6.125 mV
Lead Channel Setting Pacing Amplitude: 2.5 V
Lead Channel Setting Pacing Pulse Width: 0.4 ms
Lead Channel Setting Sensing Sensitivity: 0.3 mV

## 2016-03-28 NOTE — Progress Notes (Signed)
Remote ICD transmission.   

## 2016-03-28 NOTE — Telephone Encounter (Signed)
Remote ICM transmission received.  Attempted patient call and left detailed message regarding transmission and next ICM scheduled for 04/28/2016.  Advised to return call for any fluid symptoms or questions.

## 2016-03-28 NOTE — Progress Notes (Signed)
EPIC Encounter for ICM Monitoring  Patient Name: [name] is a 55 y.o. male Date: 03/28/2016 Primary Care Physican: [name], MD Primary [place] Electrophysiologist: Faustino Congress Weight:unknown       Attempted ICM call and unable to reach.  Left detailed message regarding transmission.  Transmission reviewed.   Thoracic impedance normal   Labs: 11/21/2015 Creatinine 1.53, BUN 16, Potassium 4.5, Sodium 141  11/07/2015 Creatinine 1.64, BUN 20, Potassium 4.6, Sodium 142  08/29/2015 Creatinine 1.19, BUN 12, Potassium 4.3, Sodium 139  05/24/2015 Creatinine 1.40, BUN 18, Potassium 4.2, Sodium 139  04/09/2015 Creatinine 1.40, BUN 18, Potassium 5.0, Sodium 142   Recommendations:  Left ICM direct phone number and encouraged to call for fluid symptoms.    Follow-up plan: ICM clinic phone appointment on 04/28/2016.  Copy of ICM check sent to device physician.   ICM trend: 03/28/2016       Rosalene Billings, RN 03/28/2016 8:50 AM

## 2016-04-02 ENCOUNTER — Encounter: Payer: Self-pay | Admitting: Cardiology

## 2016-04-09 ENCOUNTER — Telehealth: Payer: Self-pay | Admitting: Internal Medicine

## 2016-04-09 NOTE — Telephone Encounter (Signed)
New Message  Pt call requesting to speak with RN. Pt states he received a letter from the device clinic. Please call back to discuss

## 2016-04-09 NOTE — Telephone Encounter (Signed)
To Device clinic.

## 2016-04-09 NOTE — Telephone Encounter (Signed)
Spoke with patient.  Advised the letter he received was a reminder to schedule his appointment.  Patient is agreeable to appointment with Dr. Caryl Comes on 06/17/16 at 1:45pm.  He is appreciative of assistance and denies additional questions or concerns at this time.

## 2016-04-21 ENCOUNTER — Other Ambulatory Visit: Payer: Self-pay | Admitting: Family Medicine

## 2016-04-21 NOTE — Telephone Encounter (Signed)
Pt needs a refill on hydrocodone. Please call pt's wife [phone] when Rx is ready for pick up. ep

## 2016-04-22 MED ORDER — HYDROCODONE-ACETAMINOPHEN 5-325 MG PO TABS: 1.0000 | ORAL_TABLET | Freq: Three times a day (TID) | ORAL | 0 refills | Status: DC | PRN

## 2016-04-22 NOTE — Telephone Encounter (Signed)
Spoke with patient and informed him that Rx was ready for pick up. Nat Christen, CMA

## 2016-04-22 NOTE — Telephone Encounter (Signed)
Dear Dema Severin Team Please let hiom know his rx is up front for pick up [facility]! [name]

## 2016-04-22 NOTE — Telephone Encounter (Signed)
Pt called back about refills

## 2016-04-28 ENCOUNTER — Encounter: Payer: Self-pay | Admitting: *Deleted

## 2016-04-28 ENCOUNTER — Telehealth: Payer: Self-pay | Admitting: Cardiology

## 2016-04-28 ENCOUNTER — Ambulatory Visit (INDEPENDENT_AMBULATORY_CARE_PROVIDER_SITE_OTHER): Payer: Medicare Other

## 2016-04-28 DIAGNOSIS — I5022 Chronic systolic (congestive) heart failure: Secondary | ICD-10-CM | POA: Diagnosis not present

## 2016-04-28 DIAGNOSIS — Z95810 Presence of automatic (implantable) cardiac defibrillator: Secondary | ICD-10-CM | POA: Diagnosis not present

## 2016-04-28 NOTE — Telephone Encounter (Signed)
LMOVM reminding pt to send remote transmission.   

## 2016-04-29 NOTE — Progress Notes (Signed)
EPIC Encounter for ICM Monitoring  Patient Name: [name] is a 55 y.o. male Date: 04/29/2016 Primary Care Physican: [name], MD Primary [place] Electrophysiologist: Faustino Congress Weight:does not weigh             Heart Failure questions reviewed, pt asymptomatic   Thoracic impedance normal   Labs: 11/21/2015 Creatinine 1.53, BUN 16, Potassium 4.5, Sodium 141  11/07/2015 Creatinine 1.64, BUN 20, Potassium 4.6, Sodium 142  08/29/2015 Creatinine 1.19, BUN 12, Potassium 4.3, Sodium 139  05/24/2015 Creatinine 1.40, BUN 18, Potassium 4.2, Sodium 139  04/09/2015 Creatinine 1.40, BUN 18, Potassium 5.0, Sodium 142   Recommendations: No changes. Reminded to limit dietary salt intake to 2000 mg/day and fluid intake to < 2 liters/day. Encouraged to call for fluid symptoms.  Follow-up plan: ICM clinic phone appointment on 05/30/2016  Copy of ICM check sent to device physician.   3 month ICM trend: 04/29/2016   1 Year ICM trend:      Rosalene Billings, RN 04/29/2016 4:24 PM

## 2016-05-12 ENCOUNTER — Encounter: Payer: Self-pay | Admitting: Internal Medicine

## 2016-05-20 ENCOUNTER — Encounter: Payer: Self-pay | Admitting: Internal Medicine

## 2016-05-21 ENCOUNTER — Ambulatory Visit (INDEPENDENT_AMBULATORY_CARE_PROVIDER_SITE_OTHER): Payer: Medicare Other | Admitting: Family Medicine

## 2016-05-21 VITALS — BP 104/60 | HR 66 | Temp 98.0°F | Ht 62.0 in | Wt 284.0 lb

## 2016-05-21 DIAGNOSIS — I5022 Chronic systolic (congestive) heart failure: Secondary | ICD-10-CM

## 2016-05-21 DIAGNOSIS — N183 Chronic kidney disease, stage 3 unspecified: Secondary | ICD-10-CM

## 2016-05-21 DIAGNOSIS — M170 Bilateral primary osteoarthritis of knee: Secondary | ICD-10-CM

## 2016-05-21 DIAGNOSIS — IMO0002 Reserved for concepts with insufficient information to code with codable children: Secondary | ICD-10-CM

## 2016-05-21 DIAGNOSIS — E1165 Type 2 diabetes mellitus with hyperglycemia: Secondary | ICD-10-CM

## 2016-05-21 DIAGNOSIS — E118 Type 2 diabetes mellitus with unspecified complications: Secondary | ICD-10-CM

## 2016-05-21 DIAGNOSIS — I10 Essential (primary) hypertension: Secondary | ICD-10-CM | POA: Diagnosis not present

## 2016-05-21 LAB — BASIC METABOLIC PANEL
BUN: 18 mg/dL (ref 7–25)
CO2: 21 mmol/L (ref 20–31)
Calcium: 9.1 mg/dL (ref 8.6–10.3)
Chloride: 114 mmol/L — ABNORMAL HIGH (ref 98–110)
Creat: 1.44 mg/dL — ABNORMAL HIGH (ref 0.70–1.33)
Glucose, Bld: 102 mg/dL — ABNORMAL HIGH (ref 65–99)
Potassium: 4.6 mmol/L (ref 3.5–5.3)
Sodium: 143 mmol/L (ref 135–146)

## 2016-05-21 LAB — LDL CHOLESTEROL, DIRECT: Direct LDL: 104 mg/dL (ref <130)

## 2016-05-21 LAB — POCT GLYCOSYLATED HEMOGLOBIN (HGB A1C): Hemoglobin A1C: 5.6

## 2016-05-21 MED ORDER — HYDROCODONE-ACETAMINOPHEN 5-325 MG PO TABS: ORAL_TABLET | ORAL | 0 refills | Status: DC

## 2016-05-21 NOTE — Patient Instructions (Signed)
Let me see you back in about 3 months I will send you a note about your lab work

## 2016-05-23 NOTE — Assessment & Plan Note (Signed)
Continue current medication regimen for his knee pain. Follow-up 3 months.

## 2016-05-23 NOTE — Assessment & Plan Note (Signed)
He's had some improvement in his A1c and he is congratulated. No medication changes.

## 2016-05-23 NOTE — Assessment & Plan Note (Signed)
We are still holding his amlodipine as he is still taking Acetasol mind his blood pressure is running on the low side. He's currently well-controlled however. He needs some refills.

## 2016-05-23 NOTE — Progress Notes (Signed)
    CHIEF COMPLAINT / HPI:   Follow diabetes mellitus. No problems with his medicines. Not exercising much. Following about the same diet. Not checking his sugars. #2. Increased knee pain. He did go on 1 fairly long walk after that he is knee pain was increased. He is using his cane more regularly. He continues to use the pain medication with pretty good result and he uses it only intermittently  #3. Heart failure: He has not noted any increase in lower extremity edema. History was of breath is about baseline #4. Social stressors: In his wife are still separated. #5. He continues to follow with the ophthalmologist and is still on Acetasol [place] for his eye.  REVIEW OF SYSTEMS: See history of present illness OBJECTIVE:     Vital signs reviewed. GENERAL: Well-developed, well-nourished, no acute distress. CARDIOVASCULAR: Regular rate and rhythm no murmur gallop or rub LUNGS: Clear to auscultation bilaterally, no rales or wheeze. ABDOMEN: Soft positive bowel sounds. Obese NEURO: No gross focal neurological deficits. MSK: Movement of extremity x 4. Bilateral knee pain with ambulation. No pitting edema lower extremities.    ASSESSMENT / PLAN: Please see problem oriented charting for details

## 2016-05-30 ENCOUNTER — Ambulatory Visit (INDEPENDENT_AMBULATORY_CARE_PROVIDER_SITE_OTHER): Payer: Medicare Other

## 2016-05-30 ENCOUNTER — Encounter: Payer: Self-pay | Admitting: Family Medicine

## 2016-05-30 DIAGNOSIS — I5022 Chronic systolic (congestive) heart failure: Secondary | ICD-10-CM | POA: Diagnosis not present

## 2016-05-30 DIAGNOSIS — Z95810 Presence of automatic (implantable) cardiac defibrillator: Secondary | ICD-10-CM

## 2016-05-30 NOTE — Progress Notes (Addendum)
EPIC Encounter for ICM Monitoring  Patient Name: [name] is a 56 y.o. male Date: 05/30/2016 Primary Care Physican: [name], MD Primary [place] Electrophysiologist: Faustino Congress [id number] per PCP office visit 05/28/16       Heart Failure questions reviewed, pt asymptomatic.   Thoracic impedance normal   Prescribed dosage Furosemide 40 mg 1 tablet as needed   Labs: 11/21/2015 Creatinine 1.53, BUN 16, Potassium 4.5, Sodium 141  11/07/2015 Creatinine 1.64, BUN 20, Potassium 4.6, Sodium 142  08/29/2015 Creatinine 1.19, BUN 12, Potassium 4.3, Sodium 139  05/24/2015 Creatinine 1.40, BUN 18, Potassium 4.2, Sodium 139  04/09/2015 Creatinine 1.40, BUN 18, Potassium 5.0, Sodium 142   Recommendations: No changes. Reminded to limit dietary salt intake to 2000 mg/day and fluid intake to < 2 liters/day. Encouraged to call for fluid symptoms.  Follow-up plan: ICM clinic phone appointment on 07/21/2016 and defib office check with Dr Caryl Comes 06/17/2016.  Copy of ICM check sent to device physician.   3 month ICM trend: 05/30/2016   1 Year ICM trend:      Rosalene Billings, RN 05/30/2016 8:08 AM

## 2016-06-06 ENCOUNTER — Encounter: Payer: Self-pay | Admitting: Internal Medicine

## 2016-06-06 DIAGNOSIS — E669 Obesity, unspecified: Secondary | ICD-10-CM | POA: Diagnosis not present

## 2016-06-06 DIAGNOSIS — G4733 Obstructive sleep apnea (adult) (pediatric): Secondary | ICD-10-CM | POA: Diagnosis not present

## 2016-06-06 DIAGNOSIS — I420 Dilated cardiomyopathy: Secondary | ICD-10-CM | POA: Diagnosis not present

## 2016-06-06 DIAGNOSIS — E785 Hyperlipidemia, unspecified: Secondary | ICD-10-CM | POA: Diagnosis not present

## 2016-06-06 DIAGNOSIS — I119 Hypertensive heart disease without heart failure: Secondary | ICD-10-CM | POA: Diagnosis not present

## 2016-06-06 DIAGNOSIS — E119 Type 2 diabetes mellitus without complications: Secondary | ICD-10-CM | POA: Diagnosis not present

## 2016-06-17 ENCOUNTER — Encounter: Payer: Medicare Other | Admitting: Internal Medicine

## 2016-06-23 ENCOUNTER — Encounter: Payer: Self-pay | Admitting: *Deleted

## 2016-07-08 ENCOUNTER — Ambulatory Visit: Payer: Medicare Other | Admitting: Internal Medicine

## 2016-07-09 ENCOUNTER — Telehealth: Payer: Self-pay | Admitting: *Deleted

## 2016-07-09 NOTE — Telephone Encounter (Signed)
No show letter mailed to patient. 

## 2016-07-17 ENCOUNTER — Ambulatory Visit (INDEPENDENT_AMBULATORY_CARE_PROVIDER_SITE_OTHER): Payer: Medicare Other | Admitting: Internal Medicine

## 2016-07-17 ENCOUNTER — Encounter: Payer: Self-pay | Admitting: Internal Medicine

## 2016-07-17 VITALS — BP 132/74 | HR 72 | Ht 67.0 in | Wt 287.0 lb

## 2016-07-17 DIAGNOSIS — Z95810 Presence of automatic (implantable) cardiac defibrillator: Secondary | ICD-10-CM

## 2016-07-17 DIAGNOSIS — I428 Other cardiomyopathies: Secondary | ICD-10-CM | POA: Diagnosis not present

## 2016-07-17 NOTE — Patient Instructions (Signed)
Medication Instructions:  Your physician recommends that you continue on your current medications as directed. Please refer to the Current Medication list given to you today.   Labwork: None ordered  Testing/Procedures: None ordered  Follow-Up: Remote monitoring is used to monitor your ICD from home. This monitoring reduces the number of office visits required to check your device to one time per year. It allows [place] to keep an eye on the functioning of your device to ensure it is working properly. You are scheduled for a device check from home on 10/16/16. You may send your transmission at any time that day. If you have a wireless device, the transmission will be sent automatically. After your physician reviews your transmission, you will receive a postcard with your next transmission date.    Your physician wants you to follow-up in: 1 year with Dr. Caryl Comes. You will receive a reminder letter in the mail two months in advance. If you don't receive a letter, please call our office to schedule the follow-up appointment.   Any Other Special Instructions Will Be Listed Below (If Applicable).     If you need a refill on your cardiac medications before your next appointment, please call your pharmacy.

## 2016-07-17 NOTE — Progress Notes (Signed)
Patient Care Team: Dickie La, MD as PCP - General [name], MD as Consulting Physician (Ophthalmology)   HPI  [name] is a 56 y.o. male seen in followup for an ICD implanted in the setting of nonischemic heart disease and congestive heart failure. This was implanted for primary prevention. He underwent device generator replacement 2/17. He is doing pretty well. He denies CP and SOB, or edema He has sleep apnea.   He is losing his vision. This is apparently from a combination of cataracts and glaucoma. He has not been able to get his prescriptions refilled because he has not been able to see the doctor. He does not have the money for his surgery.    Past Medical History:  Diagnosis Date  . Arthritis   . Cardiomyopathy    with borderline ejection fraction in this 56 year old male  . Cataracts, bilateral    right  . Chest pain   . Congestive heart failure ([place])   . Diabetes mellitus    does not check blood sugars at home  . Diverticulosis   . Glaucoma    bilateral  . H/O TIA (transient ischemic attack) and stroke    with heart issues  . Hyperlipidemia   . Hypertension   . Implantable cardiac defibrillator -Medtronic    02/19/07, Dr. Lovena Le  . Severe hypertension     Past Surgical History:  Procedure Laterality Date  . CARDIAC CATHETERIZATION  10/24/2003   EF of 45-50%  . CARDIAC DEFIBRILLATOR PLACEMENT  2008  . CATARACT EXTRACTION  2012   right  . EP IMPLANTABLE DEVICE N/A 05/30/2015   Procedure: ICD Generator Changeout;  Surgeon: [name], MD;  Location: [facility];  Service: Cardiovascular;  Laterality: N/A;  . KNEE SURGERY Right 2000  . SHOULDER SURGERY  2012   left    Current Outpatient Prescriptions  Medication Sig Dispense Refill  . acetaZOLAMIDE (DIAMOX) 500 MG capsule BID per opthalmology    . amLODipine (NORVASC) 10 MG tablet Take 0.5 tablets (5 mg total) by mouth daily.    Marland Kitchen aspirin 81 MG tablet Take 81 mg by mouth  daily.    . carvedilol (COREG) 25 MG tablet Take 1 tablet (25 mg total) by mouth 2 (two) times daily with a meal.    . EPIPEN 2-PAK 0.3 MG/0.3ML SOAJ injection INJECT 0.3MLS INTO THE MUSCLE AS NEEDED 1 Device 1  . furosemide (LASIX) 40 MG tablet Take one by mouth daily as needed for fluid overload 90 tablet 3  . glipiZIDE (GLUCOTROL) 5 MG tablet Take 1 tablet (5 mg total) by mouth 2 (two) times daily before a meal. 180 tablet 3  . HYDROcodone-acetaminophen (NORCO/VICODIN) 5-325 MG tablet Take by mouth 1 or 2 tabs every 8 hours as needed for moderate knee pain with max of 6 tabs a day 180 tablet 0  . isosorbide mononitrate (ISMO,MONOKET) 20 MG tablet Take 1 tablet (20 mg total) by mouth 2 (two) times daily. 180 tablet 3  . loratadine (CLARITIN) 10 MG tablet Take 1 tablet (10 mg total) by mouth daily as needed for allergies. 30 tablet 11  . metFORMIN (GLUCOPHAGE) 1000 MG tablet Take 1 tablet (1,000 mg total) by mouth 2 (two) times daily with a meal. 180 tablet 6  . metroNIDAZOLE (FLAGYL) 500 MG tablet Take 1 tablet (500 mg total) by mouth 2 (two) times daily. 14 tablet 0  . pravastatin (PRAVACHOL) 80 MG tablet Take 80 mg  by mouth at bedtime.     . sacubitril-valsartan (ENTRESTO) 49-51 MG Take 1 tablet by mouth 2 (two) times daily.    Marland Kitchen spironolactone (ALDACTONE) 25 MG tablet Take 25 mg by mouth 2 (two) times daily.      No current facility-administered medications for this visit.     Allergies  Allergen Reactions  . Bee Pollen Anaphylaxis, Itching and Swelling    Review of Systems negative except from HPI and PMH  Physical Exam BP 132/74   Pulse 72   Ht 5\' 7"  (1.702 m)   Wt 287 lb (130.2 kg)   SpO2 98%   BMI 44.95 kg/m  Well developed and morbidly obese in no acute distress Pt has very poor vision secondary to his glaucoma HENT normal E scleral and icterus clear Neck Supple JVP flat; carotids brisk and full Clear to ausculation Device pocket well healed; without hematoma or  erythema.  There is no tethering  Regular rate and rhythm, no murmurs gallops or rub Soft with active bowel sounds No clubbing cyanosis no Edema  Alert and oriented, grossly normal motor and sensory function Skin Warm and Dry  ECG demonstrates sinus rhythm at 68 Intervals 17/10/39  Assessment and  Plan:  Nonischemic cardiomyopathy   Hypertension   Morbid obesity  Implantable defibrillator-Medtronic  The patient's device was interrogated today.  The information was reviewed. No changes were made in the programming.      conitnue Guideline directed medical therapy for cardiomyopathy  BP reasonably controlled  Euvolemic continue current meds  Impending blindness glaucoma/cataracts

## 2016-07-18 LAB — CUP PACEART INCLINIC DEVICE CHECK
Battery Remaining Longevity: 128 mo
Battery Voltage: 3.04 V
Brady Statistic RV Percent Paced: 0.11 %
Date Time Interrogation Session: 20180412184348
HighPow Impedance: 41 Ohm
HighPow Impedance: 56 Ohm
Implantable Lead Implant Date: 20081114
Implantable Lead Location: 753860
Implantable Lead Model: 6947
Implantable Pulse Generator Implant Date: 20170222
Lead Channel Impedance Value: 380 Ohm
Lead Channel Impedance Value: 456 Ohm
Lead Channel Pacing Threshold Amplitude: 0.5 V
Lead Channel Pacing Threshold Pulse Width: 0.4 ms
Lead Channel Sensing Intrinsic Amplitude: 6.375 mV
Lead Channel Sensing Intrinsic Amplitude: 6.875 mV
Lead Channel Setting Pacing Amplitude: 2.5 V
Lead Channel Setting Pacing Pulse Width: 0.4 ms
Lead Channel Setting Sensing Sensitivity: 0.3 mV

## 2016-07-21 ENCOUNTER — Ambulatory Visit (INDEPENDENT_AMBULATORY_CARE_PROVIDER_SITE_OTHER): Payer: Medicare Other

## 2016-07-21 ENCOUNTER — Telehealth: Payer: Self-pay | Admitting: Cardiology

## 2016-07-21 DIAGNOSIS — Z95810 Presence of automatic (implantable) cardiac defibrillator: Secondary | ICD-10-CM

## 2016-07-21 DIAGNOSIS — I5022 Chronic systolic (congestive) heart failure: Secondary | ICD-10-CM

## 2016-07-21 NOTE — Telephone Encounter (Signed)
LMOVM reminding pt to send remote transmission.   

## 2016-07-22 ENCOUNTER — Telehealth: Payer: Self-pay

## 2016-07-22 NOTE — Progress Notes (Signed)
EPIC Encounter for ICM Monitoring  Patient Name: [name] is a 56 y.o. male Date: 07/22/2016 Primary Care Physican: [name], MD Primary [place] Electrophysiologist: Faustino Congress [id number] lbs at last office visit     Heart Failure questions reviewed, pt asymptomatic.   Thoracic impedance slightly below baseline suggesting fluid accumulation for the last couple of days but trending back toward baseline.  Prescribed dosage Furosemide 40 mg 1 tablet as needed  Labs: 05/21/2016 Creatinine 1.44, BUN 18, Potassium 4.6, Sodium 143 11/21/2015 Creatinine 1.53, BUN 16, Potassium 4.5, Sodium 141  11/07/2015 Creatinine 1.64, BUN 20, Potassium 4.6, Sodium 142  08/29/2015 Creatinine 1.19, BUN 12, Potassium 4.3, Sodium 139  05/24/2015 Creatinine 1.40, BUN 18, Potassium 4.2, Sodium 139  04/09/2015 Creatinine 1.40, BUN 18, Potassium 5.0, Sodium 142   Recommendations: Encouraged him to take a prn Furosemide x 1 day.   Encouraged to call for fluid symptoms.  Follow-up plan: ICM clinic phone appointment on 08/22/2016.    Copy of ICM check sent to device physician.   3 month ICM trend: 07/22/2016   1 Year ICM trend:      Rosalene Billings, RN 07/22/2016 12:20 PM

## 2016-07-22 NOTE — Telephone Encounter (Signed)
To Dr. Klein to review. 

## 2016-07-22 NOTE — Telephone Encounter (Signed)
ICM call to patient.  He asked I give Dr Caryl Comes some information regarding the amount of money he owes his eye doctor so he can get eye drop prescription filled.  He owes total of $277 and will need to pay $30 of the bill in order for the eye doctor to write the eye drop prescription.  Advised I would send the information to Dr Olin Pia nurse and she will discuss it with Dr Caryl Comes.   Message routed to Alvis Lemmings, RN.

## 2016-07-28 NOTE — Telephone Encounter (Signed)
Per Dr. Caryl Comes- he addressed this.

## 2016-07-30 ENCOUNTER — Encounter: Payer: Self-pay | Admitting: Family Medicine

## 2016-07-30 ENCOUNTER — Ambulatory Visit (INDEPENDENT_AMBULATORY_CARE_PROVIDER_SITE_OTHER): Payer: Medicare Other | Admitting: Family Medicine

## 2016-07-30 DIAGNOSIS — I5022 Chronic systolic (congestive) heart failure: Secondary | ICD-10-CM

## 2016-07-30 DIAGNOSIS — E1165 Type 2 diabetes mellitus with hyperglycemia: Secondary | ICD-10-CM

## 2016-07-30 DIAGNOSIS — M170 Bilateral primary osteoarthritis of knee: Secondary | ICD-10-CM | POA: Diagnosis not present

## 2016-07-30 DIAGNOSIS — IMO0002 Reserved for concepts with insufficient information to code with codable children: Secondary | ICD-10-CM

## 2016-07-30 DIAGNOSIS — E118 Type 2 diabetes mellitus with unspecified complications: Secondary | ICD-10-CM | POA: Diagnosis present

## 2016-07-30 MED ORDER — HYDROCODONE-ACETAMINOPHEN 5-325 MG PO TABS: ORAL_TABLET | ORAL | 0 refills | Status: DC

## 2016-07-30 NOTE — Patient Instructions (Signed)
Let me see you in 2 months.

## 2016-07-31 ENCOUNTER — Other Ambulatory Visit: Payer: Self-pay | Admitting: Family Medicine

## 2016-07-31 MED ORDER — ISOSORBIDE MONONITRATE 20 MG PO TABS: 20.0000 mg | ORAL_TABLET | Freq: Two times a day (BID) | ORAL | 3 refills | Status: DC

## 2016-07-31 NOTE — Progress Notes (Signed)
    CHIEF COMPLAINT / HPI:   #1. Follow diabetes mellitus. He started being more active, strives to go for at least one walk a day. He has to go with someone now as his vision is so bad he can't get around the neighborhood by himself. #2. Vision: He still doesn't have the money to get his eye surgery done. Evidently there is quite a lot of the financial burden that he would have to pay. He also says that he went through this with his right eye and even though they did multiple surgeries and laser tacking he still totally lost vision so he is not sure that it would even be worth it to try to raise the finances. He continues on the eyedrops however. #3. Heart disease: He's had no new chest pain. His exercise level is actually a  little improved from last time. No night time shortness of breath. No lower extremity swelling. He sees his cardiologist regularly for checkup on his implantable cardioverter/defibrillator. #4. Knee pain: Still about the same although on days he does a lot of walking it's a little bit worse. He does need a refill on his pain medication. #5. Social: He and  his wife are at least talking again. He says things are up and down.  REVIEW OF SYSTEMS:  See HPI  OBJECTIVE:  Vital signs are reviewed.  Vital signs reviewed. GENERAL: Well-developed, well-nourished, no acute distress. Obese CARDIOVASCULAR: Regular rate and rhythm no murmur gallop or rub LUNGS: Clear to auscultation bilaterally, no rales or wheeze. Lung sounds are somewhat distant secondary to habitus. ABDOMEN: Soft positive bowel sounds. No masses are noted however exam limited by habitus. NEURO: No gross focal neurological deficits other than significant visual loss. He can only see shadows. MSK: Movement of extremity x 4.    ASSESSMENT / PLAN: Please see problem oriented charting for details

## 2016-07-31 NOTE — Assessment & Plan Note (Signed)
He seems to be euvolemic today. No medication changes. We are continuing to hold his Norvasc in the setting of his eye disease and his current use of eyedrops that also seemed to lower his blood pressure.

## 2016-07-31 NOTE — Assessment & Plan Note (Signed)
Refill on pain medicine. He is using it appropriately.

## 2016-07-31 NOTE — Assessment & Plan Note (Signed)
He is not due A1c level today. I encouraged him to continue his walking plan. His weight looks a little better as well.

## 2016-08-12 DIAGNOSIS — Z961 Presence of intraocular lens: Secondary | ICD-10-CM | POA: Diagnosis not present

## 2016-08-12 DIAGNOSIS — H401133 Primary open-angle glaucoma, bilateral, severe stage: Secondary | ICD-10-CM | POA: Diagnosis not present

## 2016-08-22 ENCOUNTER — Ambulatory Visit (INDEPENDENT_AMBULATORY_CARE_PROVIDER_SITE_OTHER): Payer: Medicare Other

## 2016-08-22 ENCOUNTER — Telehealth: Payer: Self-pay

## 2016-08-22 DIAGNOSIS — Z95810 Presence of automatic (implantable) cardiac defibrillator: Secondary | ICD-10-CM | POA: Diagnosis not present

## 2016-08-22 DIAGNOSIS — I5022 Chronic systolic (congestive) heart failure: Secondary | ICD-10-CM

## 2016-08-22 NOTE — Progress Notes (Signed)
Patient returned call.  He reported he is feeling fine and denied any fluid symptoms. Reviewed transmission.  No changes today. Next ICM remote transmission 09/22/2016.  Encouraged to call for any fluid symptoms.

## 2016-08-22 NOTE — Telephone Encounter (Signed)
Remote ICM transmission received.  Attempted patient call and left detailed message regarding transmission and next ICM scheduled for 09/22/2016.  Advised to return call for any fluid symptoms or questions.

## 2016-08-22 NOTE — Progress Notes (Signed)
EPIC Encounter for ICM Monitoring  Patient Name: [name] is a 56 y.o. male Date: 08/22/2016 Primary Care Physican: Dickie La, MD Primary [place] Electrophysiologist: Faustino Congress [id number] lbs at last office visit                                    Attempted call to patient and unable to reach.  Left detailed message regarding transmission.  Transmission reviewed.    Thoracic impedance slightly below baseline suggesting fluid accumulation for the last couple of days but trending back toward baseline.  Prescribed dosage Furosemide 40 mg 1 tablet as needed  Labs: 05/21/2016 Creatinine 1.44, BUN 18, Potassium 4.6, Sodium 143 11/21/2015 Creatinine 1.53, BUN 16, Potassium 4.5, Sodium 141  11/07/2015 Creatinine 1.64, BUN 20, Potassium 4.6, Sodium 142  08/29/2015 Creatinine 1.19, BUN 12, Potassium 4.3, Sodium 139  05/24/2015 Creatinine 1.40, BUN 18, Potassium 4.2, Sodium 139  04/09/2015 Creatinine 1.40, BUN 18, Potassium 5.0, Sodium 142   Recommendations: Left voice mail with ICM number and encouraged to call for fluid symptoms.  Follow-up plan: ICM clinic phone appointment on 09/22/2016.    Copy of ICM check sent to device physician.   3 month ICM trend: 08/22/2016   1 Year ICM trend:      Rosalene Billings, RN 08/22/2016 9:47 AM

## 2016-09-16 DIAGNOSIS — Z961 Presence of intraocular lens: Secondary | ICD-10-CM | POA: Diagnosis not present

## 2016-09-16 DIAGNOSIS — H2512 Age-related nuclear cataract, left eye: Secondary | ICD-10-CM | POA: Diagnosis not present

## 2016-09-16 DIAGNOSIS — H401133 Primary open-angle glaucoma, bilateral, severe stage: Secondary | ICD-10-CM | POA: Diagnosis not present

## 2016-09-22 ENCOUNTER — Other Ambulatory Visit: Payer: Self-pay | Admitting: Family Medicine

## 2016-09-22 ENCOUNTER — Ambulatory Visit (INDEPENDENT_AMBULATORY_CARE_PROVIDER_SITE_OTHER): Payer: Medicare Other

## 2016-09-22 DIAGNOSIS — E1159 Type 2 diabetes mellitus with other circulatory complications: Secondary | ICD-10-CM

## 2016-09-22 DIAGNOSIS — Z95810 Presence of automatic (implantable) cardiac defibrillator: Secondary | ICD-10-CM | POA: Diagnosis not present

## 2016-09-22 DIAGNOSIS — I5022 Chronic systolic (congestive) heart failure: Secondary | ICD-10-CM | POA: Diagnosis not present

## 2016-09-22 NOTE — Progress Notes (Signed)
EPIC Encounter for ICM Monitoring  Patient Name: [name] is a 56 y.o. male Date: 09/22/2016 Primary Care Physican: Dickie La, MD Primary [place] Electrophysiologist: Faustino Congress [id number] lbs at last office visit      Heart Failure questions reviewed, pt asymptomatic.   Thoracic impedance normal but was abnormal suggesting fluid accumulation 09/08/2016 to 09/21/2016.  Prescribed dosage Furosemide 40 mg 1 tablet as needed  Labs: 05/21/2016 Creatinine 1.44, BUN 18, Potassium 4.6, Sodium 143 11/21/2015 Creatinine 1.53, BUN 16, Potassium 4.5, Sodium 141  11/07/2015 Creatinine 1.64, BUN 20, Potassium 4.6, Sodium 142  08/29/2015 Creatinine 1.19, BUN 12, Potassium 4.3, Sodium 139  05/24/2015 Creatinine 1.40, BUN 18, Potassium 4.2, Sodium 139  04/09/2015 Creatinine 1.40, BUN 18, Potassium 5.0, Sodium 142   Recommendations: No changes.  He has been drinking more fluids due to hot weather. Advised to limit salt intake to 2000 mg/day and fluid intake to < 2 liters/day.  Encouraged to call for fluid symptoms.  Follow-up plan: ICM clinic phone appointment on 10/23/2016.    Copy of ICM check sent to device physician.   3 month ICM trend: 09/22/2016   1 Year ICM trend:      Rosalene Billings, RN 09/22/2016 10:07 AM

## 2016-09-22 NOTE — Telephone Encounter (Signed)
Needs refill on glizpizide.  [facility] [address].  He also needs his pain medication for his knee.  Please call when it is ready for pickup

## 2016-09-23 MED ORDER — GLIPIZIDE 5 MG PO TABS: 5.0000 mg | ORAL_TABLET | Freq: Two times a day (BID) | ORAL | 3 refills | Status: DC

## 2016-09-23 MED ORDER — HYDROCODONE-ACETAMINOPHEN 5-325 MG PO TABS: ORAL_TABLET | ORAL | 0 refills | Status: DC

## 2016-09-23 NOTE — Telephone Encounter (Signed)
Dear Dema Severin Team Can you tell him/her the rx is ready for pick up and I called in the glipizide THANKS! [name]

## 2016-09-23 NOTE — Telephone Encounter (Signed)
Message delivered

## 2016-09-23 NOTE — Telephone Encounter (Signed)
Pt wife calling to check if Rx is up front for pick up. When Rx is ready, please call and let her know. [phone]. Ottis Stain, CMA

## 2016-10-01 ENCOUNTER — Ambulatory Visit (INDEPENDENT_AMBULATORY_CARE_PROVIDER_SITE_OTHER): Payer: Medicare Other | Admitting: Family Medicine

## 2016-10-01 ENCOUNTER — Encounter: Payer: Self-pay | Admitting: Family Medicine

## 2016-10-01 VITALS — BP 100/66 | HR 53 | Temp 98.2°F | Ht 67.0 in | Wt 277.8 lb

## 2016-10-01 DIAGNOSIS — IMO0002 Reserved for concepts with insufficient information to code with codable children: Secondary | ICD-10-CM

## 2016-10-01 DIAGNOSIS — I5022 Chronic systolic (congestive) heart failure: Secondary | ICD-10-CM

## 2016-10-01 DIAGNOSIS — N183 Chronic kidney disease, stage 3 unspecified: Secondary | ICD-10-CM

## 2016-10-01 DIAGNOSIS — E1165 Type 2 diabetes mellitus with hyperglycemia: Secondary | ICD-10-CM

## 2016-10-01 DIAGNOSIS — E118 Type 2 diabetes mellitus with unspecified complications: Secondary | ICD-10-CM | POA: Diagnosis not present

## 2016-10-01 DIAGNOSIS — I10 Essential (primary) hypertension: Secondary | ICD-10-CM

## 2016-10-01 LAB — POCT GLYCOSYLATED HEMOGLOBIN (HGB A1C): Hemoglobin A1C: 5.9

## 2016-10-01 NOTE — Assessment & Plan Note (Signed)
No medication changes. His diabetes control is excellent. He is exercising more regularly. He has lost a little bit of weight.

## 2016-10-01 NOTE — Assessment & Plan Note (Signed)
No anginal symptoms. Appears euvolemic. Continue current medications

## 2016-10-01 NOTE — Assessment & Plan Note (Signed)
Last creatinine was 6 months ago so we will check today.

## 2016-10-01 NOTE — Assessment & Plan Note (Signed)
We'll DC amlodipine from his list as he continues to have low blood pressure and does not look like he is going to be able to get off the eyedrops anytime soon. Is also being followed closely by cardiology.

## 2016-10-01 NOTE — Progress Notes (Signed)
    CHIEF COMPLAINT / HPI:  #1. Studies mellitus: Not really watching his diabetes is exercising. Walking every night with his son. He has lost a little bit await. No episodes of low blood sugar. He's taking his medicines regularly without any problem.   #2. Social: He and his wife are still estranged but have open lines of communication. He's feeling a little bit better about everything  #3. Vision: Seen the ophthalmologist. They had surgery planned see if they could save the vision in his remaining but unfortunately his insurance is not going to cover it for some reason. He has follow-up with Dr. Carolynn Sayers next week. He continues on the glaucoma drops. #3. Hypertension: We have been holding his amlodipine since he started the eyedrops which lowered his blood pressure. He's not having any chest pain. He follows up regularly with his cardiologist for evaluation of his implantable defibrillator.  #4 knee pain. Continues to have problems but fairly well controlled by his current pain medicine regimen. Stiffness sometimes in the morning area no falls.  REVIEW OF SYSTEMS:  See history of present illness. Additional pertinent review of systems is negative for abdominal pain, no nausea or vomiting. No cough. No lower extremity swelling. Vision continues to decrease in remaining. He is seeing only shadows at this point requires assistance with walking.  OBJECTIVE:  Vital signs are reviewed.   GEN.: Well-developed obese male no acute distress NECK: No lymphadenopathy no thyromegaly no JVD. No carotid bruits CV: Regular rate and rhythm, no murmur. LUNGS: Decreased breath sounds secondary to body habitus but clear to auscultation throughout all lung fields without any sign of rales or wheeze. Normal respiratory effort ABDOMEN: Obese. Positive bowel sounds. Nontender.  EXTREMITY: No lower extremity edema is noted. Pain with extension of the left knee still can attain full extension. Flexion. Medial joint line  tenderness left knee.  ASSESSMENT / PLAN: Please see problem oriented charting for details

## 2016-10-02 LAB — BASIC METABOLIC PANEL
BUN/Creatinine Ratio: 12 (ref 9–20)
BUN: 21 mg/dL (ref 6–24)
CO2: 18 mmol/L — ABNORMAL LOW (ref 20–29)
Calcium: 8.7 mg/dL (ref 8.7–10.2)
Chloride: 111 mmol/L — ABNORMAL HIGH (ref 96–106)
Creatinine, Ser: 1.72 mg/dL — ABNORMAL HIGH (ref 0.76–1.27)
GFR calc Af Amer: 50 mL/min/{1.73_m2} — ABNORMAL LOW (ref >59)
GFR calc non Af Amer: 43 mL/min/{1.73_m2} — ABNORMAL LOW (ref >59)
Glucose: 72 mg/dL (ref 65–99)
Potassium: 4.8 mmol/L (ref 3.5–5.2)
Sodium: 141 mmol/L (ref 134–144)

## 2016-10-06 ENCOUNTER — Encounter: Payer: Self-pay | Admitting: Family Medicine

## 2016-10-22 DIAGNOSIS — E785 Hyperlipidemia, unspecified: Secondary | ICD-10-CM | POA: Diagnosis not present

## 2016-10-22 DIAGNOSIS — E6609 Other obesity due to excess calories: Secondary | ICD-10-CM | POA: Diagnosis not present

## 2016-10-22 DIAGNOSIS — E119 Type 2 diabetes mellitus without complications: Secondary | ICD-10-CM | POA: Diagnosis not present

## 2016-10-22 DIAGNOSIS — I420 Dilated cardiomyopathy: Secondary | ICD-10-CM | POA: Diagnosis not present

## 2016-10-22 DIAGNOSIS — I119 Hypertensive heart disease without heart failure: Secondary | ICD-10-CM | POA: Diagnosis not present

## 2016-10-23 ENCOUNTER — Ambulatory Visit (INDEPENDENT_AMBULATORY_CARE_PROVIDER_SITE_OTHER): Payer: Medicare Other | Admitting: *Deleted

## 2016-10-23 DIAGNOSIS — Z95810 Presence of automatic (implantable) cardiac defibrillator: Secondary | ICD-10-CM

## 2016-10-23 DIAGNOSIS — I428 Other cardiomyopathies: Secondary | ICD-10-CM | POA: Diagnosis not present

## 2016-10-23 DIAGNOSIS — I5022 Chronic systolic (congestive) heart failure: Secondary | ICD-10-CM

## 2016-10-23 NOTE — Progress Notes (Signed)
Remote ICD transmission.   

## 2016-10-23 NOTE — Progress Notes (Signed)
EPIC Encounter for ICM Monitoring  Patient Name: [name] is a 56 y.o. male Date: 10/23/2016 Primary Care Physican: Dickie La, MD Primary [place] Electrophysiologist: Faustino Congress [id number] lbs at last office visit(no scales at home)      Heart Failure questions reviewed, pt asymptomatic.   Thoracic impedance normal.  Prescribed dosage: Furosemide 40 mg 1 tablet as needed.  He reported taking 2-3 times in last month  Labs: 10/01/2016 Creatinine 1.72, BUN 21, Potassium 4.8, Sodium 141, EGFR 43-50 05/21/2016 Creatinine 1.44, BUN 18, Potassium 4.6, Sodium 143 11/21/2015 Creatinine 1.53, BUN 16, Potassium 4.5, Sodium 141  11/07/2015 Creatinine 1.64, BUN 20, Potassium 4.6, Sodium 142  08/29/2015 Creatinine 1.19, BUN 12, Potassium 4.3, Sodium 139  05/24/2015 Creatinine 1.40, BUN 18, Potassium 4.2, Sodium 139  04/09/2015 Creatinine 1.40, BUN 18, Potassium 5.0, Sodium 142   Recommendations: No changes.   Encouraged to call for fluid symptoms.  Follow-up plan: ICM clinic phone appointment on 11/25/2016.    Copy of ICM check sent to device physician.   3 month ICM trend: 10/23/2016   1 Year ICM trend:      Rosalene Billings, RN 10/23/2016 2:20 PM

## 2016-10-24 LAB — CUP PACEART REMOTE DEVICE CHECK
Battery Remaining Longevity: 126 mo
Battery Voltage: 3.03 V
Brady Statistic RV Percent Paced: 0.01 %
Date Time Interrogation Session: 20180719083725
HighPow Impedance: 42 Ohm
HighPow Impedance: 50 Ohm
Implantable Lead Implant Date: 20081114
Implantable Lead Location: 753860
Implantable Lead Model: 6947
Implantable Pulse Generator Implant Date: 20170222
Lead Channel Impedance Value: 437 Ohm
Lead Channel Impedance Value: 456 Ohm
Lead Channel Pacing Threshold Amplitude: 0.5 V
Lead Channel Pacing Threshold Pulse Width: 0.4 ms
Lead Channel Sensing Intrinsic Amplitude: 6 mV
Lead Channel Sensing Intrinsic Amplitude: 6 mV
Lead Channel Setting Pacing Amplitude: 2.5 V
Lead Channel Setting Pacing Pulse Width: 0.4 ms
Lead Channel Setting Sensing Sensitivity: 0.3 mV

## 2016-10-30 ENCOUNTER — Encounter: Payer: Self-pay | Admitting: Cardiology

## 2016-11-18 ENCOUNTER — Other Ambulatory Visit: Payer: Self-pay

## 2016-11-18 NOTE — Telephone Encounter (Signed)
Patient's spouse called in requesting hydorcodone.[name]

## 2016-11-20 NOTE — Telephone Encounter (Signed)
Wife states pt also needs a refill on test strips. Pt uses [facility] @ [address]. ep

## 2016-11-20 NOTE — Telephone Encounter (Signed)
2nd request. ep °

## 2016-11-24 MED ORDER — HYDROCODONE-ACETAMINOPHEN 5-325 MG PO TABS: ORAL_TABLET | ORAL | 0 refills | Status: DC

## 2016-11-24 NOTE — Telephone Encounter (Signed)
Dear Dema Severin Team I have placed his pain rx up front fopr him to pick up What kind of meter is he using as I have to order the right test strips. Once I know that I will refillthe strips THANKS! [name]

## 2016-11-25 ENCOUNTER — Ambulatory Visit (INDEPENDENT_AMBULATORY_CARE_PROVIDER_SITE_OTHER): Payer: Medicare Other

## 2016-11-25 DIAGNOSIS — I5022 Chronic systolic (congestive) heart failure: Secondary | ICD-10-CM | POA: Diagnosis not present

## 2016-11-25 DIAGNOSIS — Z95810 Presence of automatic (implantable) cardiac defibrillator: Secondary | ICD-10-CM

## 2016-11-25 NOTE — Progress Notes (Signed)
EPIC Encounter for ICM Monitoring  Patient Name: [name] is a 56 y.o. male Date: 11/25/2016 Primary Care Physican: Dickie La, MD Primary Home Electrophysiologist: Faustino Congress [id number] lbs at last office visit(no scales at home)      Heart Failure questions reviewed, pt asymptomatic.   Thoracic impedance abnormal suggesting fluid accumulation since 7/20/218 and almost back at baseline today.   Prescribed dosage: Furosemide 40 mg 1 tablet as needed.  He reported taking 2-3 times in last month  Labs: 10/01/2016 Creatinine 1.72, BUN 21, Potassium 4.8, Sodium 141, EGFR 43-50 05/21/2016 Creatinine 1.44, BUN 18, Potassium 4.6, Sodium 143 11/21/2015 Creatinine 1.53, BUN 16, Potassium 4.5, Sodium 141  11/07/2015 Creatinine 1.64, BUN 20, Potassium 4.6, Sodium 142  08/29/2015 Creatinine 1.19, BUN 12, Potassium 4.3, Sodium 139  05/24/2015 Creatinine 1.40, BUN 18, Potassium 4.2, Sodium 139  04/09/2015 Creatinine 1.40, BUN 18, Potassium 5.0, Sodium 142   Recommendations: Advised to take 1 PRN Furosemide tablet.  Advised to limit salt intake to 2000 mg/day and fluid intake to < 2 liters/day.  Encouraged to call for fluid symptoms.  Follow-up plan: ICM clinic phone appointment on 12/26/2016.    Copy of ICM check sent to device physician.   3 month ICM trend: 11/25/2016   1 Year ICM trend:      Rosalene Billings, RN 11/25/2016 10:58 AM

## 2016-12-10 ENCOUNTER — Ambulatory Visit (INDEPENDENT_AMBULATORY_CARE_PROVIDER_SITE_OTHER): Payer: Medicare Other | Admitting: Family Medicine

## 2016-12-10 ENCOUNTER — Encounter: Payer: Self-pay | Admitting: Family Medicine

## 2016-12-10 VITALS — BP 106/70 | HR 61 | Temp 97.9°F | Ht 67.0 in | Wt 283.8 lb

## 2016-12-10 DIAGNOSIS — IMO0002 Reserved for concepts with insufficient information to code with codable children: Secondary | ICD-10-CM

## 2016-12-10 DIAGNOSIS — E1165 Type 2 diabetes mellitus with hyperglycemia: Secondary | ICD-10-CM | POA: Diagnosis not present

## 2016-12-10 DIAGNOSIS — I10 Essential (primary) hypertension: Secondary | ICD-10-CM

## 2016-12-10 DIAGNOSIS — E118 Type 2 diabetes mellitus with unspecified complications: Secondary | ICD-10-CM | POA: Diagnosis not present

## 2016-12-10 DIAGNOSIS — Z23 Encounter for immunization: Secondary | ICD-10-CM | POA: Diagnosis present

## 2016-12-10 NOTE — Patient Instructions (Signed)
Your blood pressure looks good today so we will keep you off the amlodipine. I would like to see you back in early December. We will check your A1c and blood work then. Your  kidney function has been stable.  Your A1c has been so good that I think you can discontinue checking her blood sugars. Stay on your diabetes medicines, but no need to check her blood sugars. I will refill your epi-pens.

## 2016-12-12 NOTE — Progress Notes (Signed)
    CHIEF COMPLAINT / HPI:    Follow-up blood pressure medicine. His pressures are still running in the low 343B on the systolic. He's not having any dizziness and was she stands up really fast. He's not doing any of his walking right now because of his vision. His blood sugars have been doing pretty well he's had no episodes of low blood sugar.  REVIEW OF SYSTEMS:  See history of present illness  OBJECTIVE:  Vital signs are reviewed.   GEN.: Well-developed male obese, no acute distress CV: Regular rate and rhythm LUNGS: CLEAR TO AUSCULTATION BILATERALLY ABDOMEN: SOFT, POSITIVE BOWEL SOUNDS. EXTREMITY: NO EDEMA.  ASSESSMENT / PLAN: Please see problem oriented charting for details

## 2016-12-12 NOTE — Assessment & Plan Note (Signed)
Will discontinue the amlodipine. This is allergies, be able to get off eyedrops anytime seen his pressure is doing fine without the amlodipine. Follow-up 3 months. A1c today looked great.

## 2016-12-26 ENCOUNTER — Ambulatory Visit (INDEPENDENT_AMBULATORY_CARE_PROVIDER_SITE_OTHER): Payer: Medicare Other

## 2016-12-26 DIAGNOSIS — I5022 Chronic systolic (congestive) heart failure: Secondary | ICD-10-CM | POA: Diagnosis not present

## 2016-12-26 DIAGNOSIS — Z95810 Presence of automatic (implantable) cardiac defibrillator: Secondary | ICD-10-CM | POA: Diagnosis not present

## 2016-12-26 NOTE — Progress Notes (Signed)
EPIC Encounter for ICM Monitoring  Patient Name: [name] is a 56 y.o. male Date: 12/26/2016 Primary Care Physican: Dickie La, MD Primary [place] Electrophysiologist: Faustino Congress [id number]:097 lbs at last office visit(no scales at home)  Heart Failure questions reviewed, pt asymptomatic    Thoracic impedance normal since 12/22/2016 which was when he took PRN Furosemide.  Prescribed dosage: Furosemide 40 mg 1 tablet as needed. He reported taking 2-3 times in last month  Labs: 10/01/2016 Creatinine 1.72, BUN 21, Potassium 4.8, Sodium 141, EGFR 43-50 05/21/2016 Creatinine 1.44, BUN 18, Potassium 4.6, Sodium 143 11/21/2015 Creatinine 1.53, BUN 16, Potassium 4.5, Sodium 141  11/07/2015 Creatinine 1.64, BUN 20, Potassium 4.6, Sodium 142  08/29/2015 Creatinine 1.19, BUN 12, Potassium 4.3, Sodium 139  05/24/2015 Creatinine 1.40, BUN 18, Potassium 4.2, Sodium 139  04/09/2015 Creatinine 1.40, BUN 18, Potassium 5.0, Sodium 142   Recommendations: No changes.  Advised to limit salt intake to 2000 mg/day and fluid intake to < 2 liters/day.  Encouraged to call for fluid symptoms.  Follow-up plan: ICM clinic phone appointment on 01/26/2017.    Copy of ICM check sent to Dr. Caryl Comes.   3 month ICM trend: 12/26/2016   1 Year ICM trend:      Rosalene Billings, RN 12/26/2016 9:21 AM

## 2016-12-31 DIAGNOSIS — H401133 Primary open-angle glaucoma, bilateral, severe stage: Secondary | ICD-10-CM | POA: Diagnosis not present

## 2017-01-10 ENCOUNTER — Ambulatory Visit (HOSPITAL_COMMUNITY)
Admission: EM | Admit: 2017-01-10 | Discharge: 2017-01-10 | Disposition: A | Discharge status: Home / Self Care | Payer: Medicare Other | Attending: Family Medicine | Admitting: Family Medicine

## 2017-01-10 ENCOUNTER — Encounter (HOSPITAL_COMMUNITY): Payer: Self-pay

## 2017-01-10 DIAGNOSIS — M79671 Pain in right foot: Secondary | ICD-10-CM

## 2017-01-10 MED ORDER — INDOMETHACIN 50 MG PO CAPS: 50.0000 mg | ORAL_CAPSULE | Freq: Two times a day (BID) | ORAL | 0 refills | Status: DC

## 2017-01-10 NOTE — ED Provider Notes (Signed)
[name]   [id number] 01/10/17 Arrival Time: 6644  ASSESSMENT & PLAN:  1. Foot pain, right   Possible gout.  Meds ordered this encounter  Medications  . indomethacin (INDOCIN) 50 MG capsule    Sig: Take 1 capsule (50 mg total) by mouth 2 (two) times daily with a meal.    Dispense:  14 capsule    Refill:  0   Will call PCP to schedule f/u. May f/u here if needed. Reviewed expectations re: course of current medical issues. Questions answered. Outlined signs and symptoms indicating need for more acute intervention. Patient verbalized understanding. After Visit Summary given.   SUBJECTIVE:  [name] is a 56 y.o. male who presents with complaint of R proximal foot pain. Gradual onset last week after walking in steel-toed boots. No injury/trauma. Describes sharp pain with certain movements. "Hurts even if the covers on my bed touches it." No swelling or skin changes reported. Ambulatory but favoring R foot. Has been wearing bedroom slippers. No sensation changes reported. Tylenol with very slight help but temporary.  ROS: As per HPI.   OBJECTIVE:  Vitals:   01/10/17 1343  BP: 103/66  Pulse: 89  Resp: 17  Temp: 98.1 F (36.7 C)  TempSrc: Oral  SpO2: 97%    General appearance: alert; no distress Extremities: R foot with proximal medial tenderness; poorly localized; no significant swelling or erythema; FROM at ankle Skin: warm and dry Neurologic: normal gait but favors R foot; normal symmetric reflexes Psychological: alert and cooperative; normal mood and affect  Allergies  Allergen Reactions  . Bee Pollen Anaphylaxis, Itching and Swelling    Past Medical History:  Diagnosis Date  . Arthritis   . Cardiomyopathy    with borderline ejection fraction in this 56 year old male  . Cataracts, bilateral    right  . Chest pain   . Congestive heart failure ([place])   . Diabetes mellitus    does not check blood sugars at home  . Diverticulosis   .  Glaucoma    bilateral  . H/O TIA (transient ischemic attack) and stroke    with heart issues  . Hyperlipidemia   . Hypertension   . Implantable cardiac defibrillator -[employer]    02/19/07, Dr. Lovena Le  . Severe hypertension    Social History   Social History  . Marital status: Legally Separated    Spouse name: N/A  . Number of children: N/A  . Years of education: N/A   Occupational History  . Not on file.   Social History Main Topics  . Smoking status: Never Smoker  . Smokeless tobacco: Never Used  . Alcohol use No  . Drug use: No  . Sexual activity: Not on file   Other Topics Concern  . Not on file   Social History Narrative  . No narrative on file   Family History  Problem Relation Age of Onset  . Colon cancer Father   . Esophageal cancer Neg Hx   . Rectal cancer Neg Hx   . Stomach cancer Neg Hx    Past Surgical History:  Procedure Laterality Date  . CARDIAC CATHETERIZATION  10/24/2003   EF of 45-50%  . CARDIAC DEFIBRILLATOR PLACEMENT  2008  . CATARACT EXTRACTION  2012   right  . EP IMPLANTABLE DEVICE N/A 05/30/2015   Procedure: ICD Generator Changeout;  Surgeon: [name], MD;  Location: [facility];  Service: Cardiovascular;  Laterality: N/A;  . KNEE SURGERY Right 2000  .  SHOULDER SURGERY  2012   left     Vanessa Kick, MD 01/10/17 [phone]

## 2017-01-10 NOTE — ED Triage Notes (Signed)
Patient presents to [facility] with right foot pain x1 week, pt states rt foot is tender to touch with a sharp pain

## 2017-01-19 ENCOUNTER — Telehealth: Payer: Self-pay

## 2017-01-19 MED ORDER — INDOMETHACIN 50 MG PO CAPS: 50.0000 mg | ORAL_CAPSULE | Freq: Two times a day (BID) | ORAL | 0 refills | Status: DC

## 2017-01-19 NOTE — Telephone Encounter (Signed)
Patient calling to request refill of: indomethacin, 50mg  caps.   Name of Medication(s): Indomethacin 50mg  caps (Given to him in the ED) Last date of OV: 12/10/2016 Pharmacy:  [place], [place]  Will route refill request to Clinic RN.  Discussed with patient policy to call pharmacy for future refills.  Also, discussed refills may take up to 48 hours to approve or deny. Please call pt and let her know if this is being refilled.  [name]

## 2017-01-19 NOTE — Addendum Note (Signed)
Addended byDorcas Mcmurray L on: 01/19/2017 03:45 PM   Modules accepted: Orders

## 2017-01-26 ENCOUNTER — Telehealth: Payer: Self-pay | Admitting: Cardiology

## 2017-01-26 ENCOUNTER — Ambulatory Visit (INDEPENDENT_AMBULATORY_CARE_PROVIDER_SITE_OTHER): Payer: Medicare Other | Admitting: *Deleted

## 2017-01-26 DIAGNOSIS — I428 Other cardiomyopathies: Secondary | ICD-10-CM | POA: Diagnosis not present

## 2017-01-26 DIAGNOSIS — I5022 Chronic systolic (congestive) heart failure: Secondary | ICD-10-CM | POA: Diagnosis not present

## 2017-01-26 DIAGNOSIS — Z95810 Presence of automatic (implantable) cardiac defibrillator: Secondary | ICD-10-CM | POA: Diagnosis not present

## 2017-01-26 NOTE — Telephone Encounter (Signed)
Spoke with pt and reminded pt of remote transmission that is due today. Pt verbalized understanding.   

## 2017-01-26 NOTE — Progress Notes (Signed)
Remote ICD transmission.   

## 2017-01-27 NOTE — Progress Notes (Signed)
EPIC Encounter for ICM Monitoring  Patient Name: [name] is a 56 y.o. male Date: 01/27/2017 Primary Care Physican: Dickie La, MD Primary [place] Electrophysiologist: Faustino Congress [id number] lbs at last office visit(no scales at home)      Heart Failure questions reviewed, pt asymptomatic.   Thoracic impedance normal.  Prescribed dosage: Furosemide 40 mg 1 tablet as needed. He reported taking 2-3 times in last month  Labs: 10/01/2016 Creatinine 1.72, BUN 21, Potassium 4.8, Sodium 141, EGFR 43-50 05/21/2016 Creatinine 1.44, BUN 18, Potassium 4.6, Sodium 143 11/21/2015 Creatinine 1.53, BUN 16, Potassium 4.5, Sodium 141  11/07/2015 Creatinine 1.64, BUN 20, Potassium 4.6, Sodium 142  08/29/2015 Creatinine 1.19, BUN 12, Potassium 4.3, Sodium 139  05/24/2015 Creatinine 1.40, BUN 18, Potassium 4.2, Sodium 139  04/09/2015 Creatinine 1.40, BUN 18, Potassium 5.0, Sodium 142   Recommendations: No changes.  Encouraged to call for fluid symptoms.  Follow-up plan: ICM clinic phone appointment on 03/02/2017.    Copy of ICM check sent to Dr. Caryl Comes.   3 month ICM trend: 01/26/2017   1 Year ICM trend:      Rosalene Billings, RN 01/27/2017 2:02 PM

## 2017-01-28 LAB — CUP PACEART REMOTE DEVICE CHECK
Battery Remaining Longevity: 124 mo
Battery Voltage: 3.03 V
Brady Statistic RV Percent Paced: 0.01 %
Date Time Interrogation Session: 20181022202305
HighPow Impedance: 41 Ohm
HighPow Impedance: 47 Ohm
Implantable Lead Implant Date: 20081114
Implantable Lead Location: 753860
Implantable Lead Model: 6947
Implantable Pulse Generator Implant Date: 20170222
Lead Channel Impedance Value: 380 Ohm
Lead Channel Impedance Value: 437 Ohm
Lead Channel Pacing Threshold Amplitude: 0.5 V
Lead Channel Pacing Threshold Pulse Width: 0.4 ms
Lead Channel Sensing Intrinsic Amplitude: 5.375 mV
Lead Channel Sensing Intrinsic Amplitude: 5.375 mV
Lead Channel Setting Pacing Amplitude: 2.5 V
Lead Channel Setting Pacing Pulse Width: 0.4 ms
Lead Channel Setting Sensing Sensitivity: 0.3 mV

## 2017-01-30 ENCOUNTER — Encounter: Payer: Self-pay | Admitting: Cardiology

## 2017-02-19 ENCOUNTER — Other Ambulatory Visit: Payer: Self-pay | Admitting: *Deleted

## 2017-02-19 NOTE — Telephone Encounter (Signed)
Pt is going out of town Tuesday and needs his new Rx written a week in advance for when he goes out of town.

## 2017-02-19 NOTE — Telephone Encounter (Signed)
Pt wife calling in stating that he changed his phamacy to [facility] and wants an epi pen sent to them. He also wants to know if you can send him in his hydrocodone because he is going out of town Tuesday. Please advise. Marsha Hillman Kennon Holter, CMA

## 2017-02-20 ENCOUNTER — Telehealth: Payer: Self-pay | Admitting: Family Medicine

## 2017-02-20 MED ORDER — HYDROCODONE-ACETAMINOPHEN 5-325 MG PO TABS: ORAL_TABLET | ORAL | 0 refills | Status: DC

## 2017-02-20 MED ORDER — EPINEPHRINE 0.3 MG/0.3ML IJ SOAJ: INTRAMUSCULAR | 1 refills | Status: DC

## 2017-02-20 NOTE — Telephone Encounter (Signed)
Wife called because she and Terryn are leaving out of town on Tuesday for the holidays and would like a refill of his Hydrocodone and Epi Pen to be left up front. Please call when this ready to pick up. jw

## 2017-02-23 ENCOUNTER — Other Ambulatory Visit: Payer: Self-pay | Admitting: Family Medicine

## 2017-02-23 MED ORDER — HYDROCODONE-ACETAMINOPHEN 5-325 MG PO TABS: ORAL_TABLET | ORAL | 0 refills | Status: DC

## 2017-02-24 NOTE — Telephone Encounter (Signed)
Pt has already picked this Rx up per the book up front. Katharina Caper, Mckenzy Salazar D, [place]

## 2017-03-02 ENCOUNTER — Ambulatory Visit (INDEPENDENT_AMBULATORY_CARE_PROVIDER_SITE_OTHER): Payer: Medicare Other

## 2017-03-02 ENCOUNTER — Telehealth: Payer: Self-pay | Admitting: Cardiology

## 2017-03-02 ENCOUNTER — Other Ambulatory Visit: Payer: Self-pay | Admitting: *Deleted

## 2017-03-02 ENCOUNTER — Telehealth: Payer: Self-pay

## 2017-03-02 DIAGNOSIS — Z95810 Presence of automatic (implantable) cardiac defibrillator: Secondary | ICD-10-CM | POA: Diagnosis not present

## 2017-03-02 DIAGNOSIS — I5022 Chronic systolic (congestive) heart failure: Secondary | ICD-10-CM | POA: Diagnosis not present

## 2017-03-02 MED ORDER — METFORMIN HCL 1000 MG PO TABS: 1000.0000 mg | ORAL_TABLET | Freq: Two times a day (BID) | ORAL | 6 refills | Status: DC

## 2017-03-02 NOTE — Telephone Encounter (Signed)
Wife LM on nurse line asking for a new script for metformin.  She stated that his current script is expired and will need a new one sent to the pharmacy.  They use [facility] on [address]. Jazmin Hartsell,CMA

## 2017-03-02 NOTE — Telephone Encounter (Signed)
LMOVM reminding pt to send remote transmission.   

## 2017-03-02 NOTE — Progress Notes (Signed)
EPIC Encounter for ICM Monitoring  Patient Name: [name] is a 56 y.o. male Date: 03/02/2017 Primary Care Physican: Dickie La, MD Primary [place] Electrophysiologist: Faustino Congress [id number]:173 lbs at last office visit(no scales at home)        Attempted call to patient and unable to reach.  Left detailed message regarding transmission.  Transmission reviewed.    Thoracic impedance normal.  Prescribed dosage: Furosemide 40 mg 1 tablet as needed. He reported taking 2-3 times in last month  Labs: 10/01/2016 Creatinine 1.72, BUN 21, Potassium 4.8, Sodium 141, EGFR 43-50 05/21/2016 Creatinine 1.44, BUN 18, Potassium 4.6, Sodium 143 11/21/2015 Creatinine 1.53, BUN 16, Potassium 4.5, Sodium 141  11/07/2015 Creatinine 1.64, BUN 20, Potassium 4.6, Sodium 142  08/29/2015 Creatinine 1.19, BUN 12, Potassium 4.3, Sodium 139  05/24/2015 Creatinine 1.40, BUN 18, Potassium 4.2, Sodium 139  04/09/2015 Creatinine 1.40, BUN 18, Potassium 5.0, Sodium 142   Recommendations: *Left voice mail with ICM number and encouraged to call if experiencing any fluid symptoms.  Follow-up plan: ICM clinic phone appointment on 04/02/2017.    Copy of ICM check sent to Dr. Caryl Comes.   3 month ICM trend: 03/02/2017    1 Year ICM trend:       Rosalene Billings, RN 03/02/2017 1:31 PM

## 2017-03-02 NOTE — Telephone Encounter (Signed)
Remote ICM transmission received.  Attempted call to patient and left detailed message per DPR regarding transmission and next ICM scheduled for 04/02/2017.  Advised to return call for any fluid symptoms or questions.

## 2017-03-11 ENCOUNTER — Ambulatory Visit (INDEPENDENT_AMBULATORY_CARE_PROVIDER_SITE_OTHER): Payer: Medicare Other | Admitting: Family Medicine

## 2017-03-11 ENCOUNTER — Other Ambulatory Visit: Payer: Self-pay

## 2017-03-11 ENCOUNTER — Encounter: Payer: Self-pay | Admitting: Family Medicine

## 2017-03-11 VITALS — BP 110/72 | HR 58 | Temp 97.6°F | Ht 67.0 in | Wt 282.4 lb

## 2017-03-11 DIAGNOSIS — N183 Chronic kidney disease, stage 3 unspecified: Secondary | ICD-10-CM

## 2017-03-11 DIAGNOSIS — IMO0002 Reserved for concepts with insufficient information to code with codable children: Secondary | ICD-10-CM

## 2017-03-11 DIAGNOSIS — E118 Type 2 diabetes mellitus with unspecified complications: Secondary | ICD-10-CM | POA: Diagnosis not present

## 2017-03-11 DIAGNOSIS — E1165 Type 2 diabetes mellitus with hyperglycemia: Secondary | ICD-10-CM | POA: Diagnosis not present

## 2017-03-11 DIAGNOSIS — I5022 Chronic systolic (congestive) heart failure: Secondary | ICD-10-CM | POA: Diagnosis not present

## 2017-03-11 DIAGNOSIS — I10 Essential (primary) hypertension: Secondary | ICD-10-CM

## 2017-03-11 LAB — POCT GLYCOSYLATED HEMOGLOBIN (HGB A1C): Hemoglobin A1C: 5.9

## 2017-03-11 MED ORDER — HYDROCODONE-ACETAMINOPHEN 5-325 MG PO TABS: ORAL_TABLET | ORAL | 0 refills | Status: DC

## 2017-03-11 NOTE — Patient Instructions (Signed)
STOP the glipizide. Continue the metformin and your other medicines.

## 2017-03-12 LAB — COMPREHENSIVE METABOLIC PANEL
ALT: 9 IU/L (ref 0–44)
AST: 11 IU/L (ref 0–40)
Albumin/Globulin Ratio: 1.6 (ref 1.2–2.2)
Albumin: 4.3 g/dL (ref 3.5–5.5)
Alkaline Phosphatase: 50 IU/L (ref 39–117)
BUN/Creatinine Ratio: 14 (ref 9–20)
BUN: 24 mg/dL (ref 6–24)
Bilirubin Total: 0.4 mg/dL (ref 0.0–1.2)
CO2: 18 mmol/L — ABNORMAL LOW (ref 20–29)
Calcium: 9.3 mg/dL (ref 8.7–10.2)
Chloride: 111 mmol/L — ABNORMAL HIGH (ref 96–106)
Creatinine, Ser: 1.75 mg/dL — ABNORMAL HIGH (ref 0.76–1.27)
GFR calc Af Amer: 49 mL/min/{1.73_m2} — ABNORMAL LOW (ref >59)
GFR calc non Af Amer: 42 mL/min/{1.73_m2} — ABNORMAL LOW (ref >59)
Globulin, Total: 2.7 g/dL (ref 1.5–4.5)
Glucose: 110 mg/dL — ABNORMAL HIGH (ref 65–99)
Potassium: 5.2 mmol/L (ref 3.5–5.2)
Sodium: 142 mmol/L (ref 134–144)
Total Protein: 7 g/dL (ref 6.0–8.5)

## 2017-03-12 LAB — LDL CHOLESTEROL, DIRECT: LDL Direct: 125 mg/dL — ABNORMAL HIGH (ref 0–99)

## 2017-03-13 ENCOUNTER — Encounter: Payer: Self-pay | Admitting: Family Medicine

## 2017-03-13 NOTE — Assessment & Plan Note (Signed)
Currently seems euvolemic.  Asymptomatic.  Continue current medications.  Follow-up 3 months.

## 2017-03-13 NOTE — Assessment & Plan Note (Signed)
A1c looks great.  We will stop his glipizide and follow-up in 3 months see how he does.  Continue his metformin.

## 2017-03-13 NOTE — Assessment & Plan Note (Signed)
Recheck lab work today

## 2017-03-13 NOTE — Progress Notes (Signed)
    CHIEF COMPLAINT / HPI: Follow-up diabetes mellitus.  Taking his medicines regularly.  No episodes of low blood sugar.  No increased frequency of urination.  Appetite's been stable.  Weight has been stable. #2.  Follow-up coronary artery disease.  Not having any chest pain.  Taking his medicines regularly.  As noted no lower extremity swelling.  No unusual shortness of breath 3.  Visual changes is being followed by his ophthalmologist Dr. Carolynn Sayers.  He has an appointment next month.  He was last seen in September.  He continues on the ophthalmologic medications as prescribed. #4.  Follow-up chronic knee pain.  No better.  Using his cane full-time now  REVIEW OF SYSTEMS:  See HPI  PERTINENT  PMH / [place]: I have reviewed the patient's medications, allergies, past medical and surgical history, smoking status and updated in the EMR as appropriate.   OBJECTIVE:   Vital signs reviewed. GENERAL: Well-developed, well-nourished, no acute distress.  Obese CARDIOVASCULAR: Regular rate and rhythm no murmur gallop or rub LUNGS: Clear to auscultation bilaterally, no rales or wheeze. ABDOMEN: Soft positive bowel sounds.  Habitus. NEURO: No gross focal neurological deficits except decreased vision in both eyes.  He is walking with a cane and needs assistance to navigate. MSK: Movement of extremity x 4.  Left knee extension limited by pain but passive range of motion is full.  Medial joint tenderness to palpation.  Flexion is limited by 10 degrees.  Calf is soft.    ASSESSMENT / PLAN: Please see problem oriented charting for details

## 2017-03-23 ENCOUNTER — Encounter: Payer: Self-pay | Admitting: Family Medicine

## 2017-04-02 ENCOUNTER — Ambulatory Visit (INDEPENDENT_AMBULATORY_CARE_PROVIDER_SITE_OTHER): Payer: Medicare Other

## 2017-04-02 DIAGNOSIS — I5022 Chronic systolic (congestive) heart failure: Secondary | ICD-10-CM | POA: Diagnosis not present

## 2017-04-02 DIAGNOSIS — Z95810 Presence of automatic (implantable) cardiac defibrillator: Secondary | ICD-10-CM

## 2017-04-03 ENCOUNTER — Telehealth: Payer: Self-pay

## 2017-04-03 NOTE — Telephone Encounter (Signed)
Remote ICM transmission received.  Attempted call to patient and no message. 

## 2017-04-03 NOTE — Progress Notes (Signed)
EPIC Encounter for ICM Monitoring  Patient Name: [name] is a 56 y.o. male Date: 04/03/2017 Primary Care Physican: Dickie La, MD Primary [place] Electrophysiologist: Faustino Congress Weight:Previous weight 287 lbs at last office visit(no scales at home)          Attempted call to patient and unable to reach.    Transmission reviewed.    Thoracic impedance normal.  Prescribed dosage: Furosemide 40 mg 1 tablet as needed.   Labs: 10/01/2016 Creatinine 1.72, BUN 21, Potassium 4.8, Sodium 141, EGFR 43-50 05/21/2016 Creatinine 1.44, BUN 18, Potassium 4.6, Sodium 143 11/21/2015 Creatinine 1.53, BUN 16, Potassium 4.5, Sodium 141  11/07/2015 Creatinine 1.64, BUN 20, Potassium 4.6, Sodium 142  08/29/2015 Creatinine 1.19, BUN 12, Potassium 4.3, Sodium 139  05/24/2015 Creatinine 1.40, BUN 18, Potassium 4.2, Sodium 139  04/09/2015 Creatinine 1.40, BUN 18, Potassium 5.0, Sodium 142  Recommendations: NONE - Unable to reach.  Follow-up plan: ICM clinic phone appointment on 05/04/2017.   Copy of ICM check sent to Dr. Caryl Comes.   3 month ICM trend: 04/02/2017    1 Year ICM trend:       Rosalene Billings, RN 04/03/2017 1:35 PM

## 2017-04-06 ENCOUNTER — Other Ambulatory Visit: Payer: Self-pay | Admitting: Family Medicine

## 2017-04-22 ENCOUNTER — Other Ambulatory Visit: Payer: Self-pay | Admitting: Family Medicine

## 2017-04-22 NOTE — Telephone Encounter (Signed)
His feet are inflamed again. He would like a refill on the pills she gave him last time.  [name]

## 2017-04-23 MED ORDER — COLCHICINE 0.6 MG PO TABS: ORAL_TABLET | ORAL | 1 refills | Status: DC

## 2017-04-23 NOTE — Telephone Encounter (Signed)
Pt informed. Sharon T Saunders, CMA  

## 2017-04-23 NOTE — Telephone Encounter (Signed)
Dear [facility] I have called in some colchicine. Last time we gave him something different (indomethacin) but this is better---especially given his other medical issues. He can take one a day IF he needs it. No need to take if he does not THANKS! [name]

## 2017-05-04 ENCOUNTER — Ambulatory Visit (INDEPENDENT_AMBULATORY_CARE_PROVIDER_SITE_OTHER): Payer: Medicare Other | Admitting: *Deleted

## 2017-05-04 DIAGNOSIS — I5022 Chronic systolic (congestive) heart failure: Secondary | ICD-10-CM | POA: Diagnosis not present

## 2017-05-04 DIAGNOSIS — Z95810 Presence of automatic (implantable) cardiac defibrillator: Secondary | ICD-10-CM | POA: Diagnosis not present

## 2017-05-04 DIAGNOSIS — I428 Other cardiomyopathies: Secondary | ICD-10-CM

## 2017-05-04 NOTE — Progress Notes (Signed)
Remote ICD transmission.   

## 2017-05-05 ENCOUNTER — Telehealth: Payer: Self-pay

## 2017-05-05 NOTE — Telephone Encounter (Signed)
Remote ICM transmission received.  Attempted call to patient and left message to return call. 

## 2017-05-05 NOTE — Progress Notes (Signed)
EPIC Encounter for ICM Monitoring  Patient Name: [name] is a 57 y.o. male Date: 05/05/2017 Primary Care Physican: Dickie La, MD Primary [place] Electrophysiologist: Faustino Congress Weight:Previous weight 287 lbs at last office visit(no scales at home)       Attempted call to patient and unable to reach.  Left message to return call.  Transmission reviewed.    Thoracic impedance normal.  Prescribed dosage: Furosemide 40 mg 1 tablet as needed.   Labs: 03/11/2017 Creatinine 1.75, BUN 24, Potassium 5.2, Sodium 111, EGFR 42-49 10/01/2016 Creatinine 1.72, BUN 21, Potassium 4.8, Sodium 141, EGFR 43-50 05/21/2016 Creatinine 1.44, BUN 18, Potassium 4.6, Sodium 143 11/21/2015 Creatinine 1.53, BUN 16, Potassium 4.5, Sodium 141  11/07/2015 Creatinine 1.64, BUN 20, Potassium 4.6, Sodium 142  08/29/2015 Creatinine 1.19, BUN 12, Potassium 4.3, Sodium 139  05/24/2015 Creatinine 1.40, BUN 18, Potassium 4.2, Sodium 139  04/09/2015 Creatinine 1.40, BUN 18, Potassium 5.0, Sodium 142  Recommendations: NONE - Unable to reach.  Follow-up plan: ICM clinic phone appointment on 06/04/2017.   Copy of ICM check sent to Dr. Caryl Comes.   3 month ICM trend: 05/04/2017    1 Year ICM trend:       Rosalene Billings, RN 05/05/2017 4:00 PM

## 2017-05-05 NOTE — Progress Notes (Signed)
Patient returned call.  He said he is feeling fine and no complaints at this time.  Transmission reviewed.  He has not needed PRN Lasix in the last month.  No changes today and next ICM transmission 06/04/2017.  Encouraged to call for fluid symptoms.

## 2017-05-07 ENCOUNTER — Encounter: Payer: Self-pay | Admitting: Cardiology

## 2017-05-13 DIAGNOSIS — H401133 Primary open-angle glaucoma, bilateral, severe stage: Secondary | ICD-10-CM | POA: Diagnosis not present

## 2017-05-16 LAB — CUP PACEART REMOTE DEVICE CHECK
Battery Remaining Longevity: 122 mo
Battery Voltage: 3.03 V
Brady Statistic RV Percent Paced: 0.01 %
Date Time Interrogation Session: 20190128072704
HighPow Impedance: 43 Ohm
HighPow Impedance: 53 Ohm
Implantable Lead Implant Date: 20081114
Implantable Lead Location: 753860
Implantable Lead Model: 6947
Implantable Pulse Generator Implant Date: 20170222
Lead Channel Impedance Value: 342 Ohm
Lead Channel Impedance Value: 437 Ohm
Lead Channel Pacing Threshold Amplitude: 0.5 V
Lead Channel Pacing Threshold Pulse Width: 0.4 ms
Lead Channel Sensing Intrinsic Amplitude: 6.25 mV
Lead Channel Sensing Intrinsic Amplitude: 6.25 mV
Lead Channel Setting Pacing Amplitude: 2.5 V
Lead Channel Setting Pacing Pulse Width: 0.4 ms
Lead Channel Setting Sensing Sensitivity: 0.3 mV

## 2017-05-20 ENCOUNTER — Other Ambulatory Visit: Payer: Self-pay | Admitting: Family Medicine

## 2017-05-20 MED ORDER — HYDROCODONE-ACETAMINOPHEN 5-325 MG PO TABS: ORAL_TABLET | ORAL | 0 refills | Status: DC

## 2017-05-25 ENCOUNTER — Other Ambulatory Visit: Payer: Self-pay | Admitting: Family Medicine

## 2017-06-04 ENCOUNTER — Ambulatory Visit (INDEPENDENT_AMBULATORY_CARE_PROVIDER_SITE_OTHER): Payer: Medicare Other

## 2017-06-04 DIAGNOSIS — Z95810 Presence of automatic (implantable) cardiac defibrillator: Secondary | ICD-10-CM

## 2017-06-04 DIAGNOSIS — I5022 Chronic systolic (congestive) heart failure: Secondary | ICD-10-CM

## 2017-06-04 NOTE — Progress Notes (Signed)
EPIC Encounter for ICM Monitoring  Patient Name: [name] is a 57 y.o. male Date: 06/04/2017 Primary Care Physican: Dickie La, MD Primary [place] Electrophysiologist: Faustino Congress Weight:Previous [id number] lbs at last office visit(no scales at home)       Heart Failure questions reviewed, pt asymptomatic.   Thoracic impedance slightly below baseline suggesting fluid accumulation.  Prescribed dosage: Furosemide 40 mg 1 tablet as needed.   Labs: 03/11/2017 Creatinine 1.75, BUN 24, Potassium 5.2, Sodium 111, EGFR 42-49 10/01/2016 Creatinine 1.72, BUN 21, Potassium 4.8, Sodium 141, EGFR 43-50 05/21/2016 Creatinine 1.44, BUN 18, Potassium 4.6, Sodium 143 11/21/2015 Creatinine 1.53, BUN 16, Potassium 4.5, Sodium 141  11/07/2015 Creatinine 1.64, BUN 20, Potassium 4.6, Sodium 142  08/29/2015 Creatinine 1.19, BUN 12, Potassium 4.3, Sodium 139  05/24/2015 Creatinine 1.40, BUN 18, Potassium 4.2, Sodium 139  04/09/2015 Creatinine 1.40, BUN 18, Potassium 5.0, Sodium 142  Recommendations:  Advised to take PRN Furosemide if needed.  Encouraged to call for fluid symptoms.  Follow-up plan: ICM clinic phone appointment on 07/06/2017.    Copy of ICM check sent to Dr. Caryl Comes.   3 month ICM trend: 06/04/2017     1 Year ICM trend:       Rosalene Billings, RN 06/04/2017 10:02 AM

## 2017-06-18 ENCOUNTER — Other Ambulatory Visit: Payer: Self-pay | Admitting: Family Medicine

## 2017-06-24 ENCOUNTER — Ambulatory Visit: Payer: Medicare Other | Admitting: Family Medicine

## 2017-06-29 DIAGNOSIS — E785 Hyperlipidemia, unspecified: Secondary | ICD-10-CM | POA: Diagnosis not present

## 2017-06-29 DIAGNOSIS — I119 Hypertensive heart disease without heart failure: Secondary | ICD-10-CM | POA: Diagnosis not present

## 2017-06-29 DIAGNOSIS — E119 Type 2 diabetes mellitus without complications: Secondary | ICD-10-CM | POA: Diagnosis not present

## 2017-06-29 DIAGNOSIS — G4733 Obstructive sleep apnea (adult) (pediatric): Secondary | ICD-10-CM | POA: Diagnosis not present

## 2017-06-29 DIAGNOSIS — I5020 Unspecified systolic (congestive) heart failure: Secondary | ICD-10-CM | POA: Diagnosis not present

## 2017-06-29 DIAGNOSIS — I420 Dilated cardiomyopathy: Secondary | ICD-10-CM | POA: Diagnosis not present

## 2017-07-06 ENCOUNTER — Ambulatory Visit (INDEPENDENT_AMBULATORY_CARE_PROVIDER_SITE_OTHER): Payer: Medicare Other

## 2017-07-06 ENCOUNTER — Telehealth: Payer: Self-pay | Admitting: Cardiology

## 2017-07-06 DIAGNOSIS — Z95810 Presence of automatic (implantable) cardiac defibrillator: Secondary | ICD-10-CM | POA: Diagnosis not present

## 2017-07-06 DIAGNOSIS — I5022 Chronic systolic (congestive) heart failure: Secondary | ICD-10-CM | POA: Diagnosis not present

## 2017-07-06 NOTE — Telephone Encounter (Signed)
Confirmed remote transmission w/ pt wife.   

## 2017-07-06 NOTE — Progress Notes (Signed)
EPIC Encounter for ICM Monitoring  Patient Name: [name] is a 57 y.o. male Date: 07/06/2017 Primary Care Physican: Dickie La, MD Primary [place] Electrophysiologist: Faustino Congress Weight:Previous [id number] lbs at last office visit(no scales at home)        Attempted call to patient and unable to reach.   Transmission reviewed.    Thoracic impedance normal.  Prescribed dosage: Furosemide 40 mg 1 tablet as needed.   Labs: 03/11/2017 Creatinine 1.75, BUN 24, Potassium 5.2, Sodium 111, EGFR 42-49 10/01/2016 Creatinine 1.72, BUN 21, Potassium 4.8, Sodium 141, EGFR 43-50 05/21/2016 Creatinine 1.44, BUN 18, Potassium 4.6, Sodium 143 11/21/2015 Creatinine 1.53, BUN 16, Potassium 4.5, Sodium 141  11/07/2015 Creatinine 1.64, BUN 20, Potassium 4.6, Sodium 142  08/29/2015 Creatinine 1.19, BUN 12, Potassium 4.3, Sodium 139  05/24/2015 Creatinine 1.40, BUN 18, Potassium 4.2, Sodium 139  04/09/2015 Creatinine 1.40, BUN 18, Potassium 5.0, Sodium 142  Recommendations: NONE - Unable to reach.  Follow-up plan: ICM clinic phone appointment on 08/06/2017.   Copy of ICM check sent to Dr. Caryl Comes.   3 month ICM trend: 07/06/2017    1 Year ICM trend:       Rosalene Billings, RN 07/06/2017 4:58 PM

## 2017-07-07 ENCOUNTER — Telehealth: Payer: Self-pay

## 2017-07-07 NOTE — Telephone Encounter (Signed)
Remote ICM transmission received.  Attempted call to patient and voice mail box has not been set up.

## 2017-07-15 ENCOUNTER — Encounter: Payer: Self-pay | Admitting: Family Medicine

## 2017-07-15 ENCOUNTER — Ambulatory Visit (INDEPENDENT_AMBULATORY_CARE_PROVIDER_SITE_OTHER): Payer: Medicare Other | Admitting: Family Medicine

## 2017-07-15 ENCOUNTER — Other Ambulatory Visit: Payer: Self-pay

## 2017-07-15 VITALS — BP 108/72 | HR 70 | Temp 98.1°F | Ht 67.0 in | Wt 295.2 lb

## 2017-07-15 DIAGNOSIS — M25551 Pain in right hip: Secondary | ICD-10-CM | POA: Diagnosis not present

## 2017-07-15 DIAGNOSIS — IMO0002 Reserved for concepts with insufficient information to code with codable children: Secondary | ICD-10-CM

## 2017-07-15 DIAGNOSIS — M25552 Pain in left hip: Secondary | ICD-10-CM | POA: Diagnosis not present

## 2017-07-15 DIAGNOSIS — E118 Type 2 diabetes mellitus with unspecified complications: Secondary | ICD-10-CM | POA: Diagnosis not present

## 2017-07-15 DIAGNOSIS — I10 Essential (primary) hypertension: Secondary | ICD-10-CM

## 2017-07-15 DIAGNOSIS — E1165 Type 2 diabetes mellitus with hyperglycemia: Secondary | ICD-10-CM | POA: Diagnosis not present

## 2017-07-15 DIAGNOSIS — G4733 Obstructive sleep apnea (adult) (pediatric): Secondary | ICD-10-CM

## 2017-07-15 DIAGNOSIS — M170 Bilateral primary osteoarthritis of knee: Secondary | ICD-10-CM | POA: Diagnosis not present

## 2017-07-15 MED ORDER — HYDROCODONE-ACETAMINOPHEN 5-325 MG PO TABS: ORAL_TABLET | ORAL | 0 refills | Status: DC

## 2017-07-15 NOTE — Assessment & Plan Note (Signed)
He is really having pain down both hips but probably the left hip is worse.  We will increase his medication coverage from [place].  Get hip x-rays.  He would not be an ideal candidate for total hip replacement but at some point suspect we will need to do this to get that would like to see where he is current point in time from x-ray.  We discussed at length.

## 2017-07-15 NOTE — Patient Instructions (Addendum)
Please get your hip x rays The sleep lab  Should call you within the next week re your sleep study YOU NEED TO DO THIS Let me see you in about 6 weeks

## 2017-07-15 NOTE — Assessment & Plan Note (Signed)
Doing fairly well with pain management of his knee arthritis but now that he is having problems with his hips, I will increase his dosage.

## 2017-07-15 NOTE — Assessment & Plan Note (Signed)
Diabetes has been well controlled.  I would like for him to lose some weight for other reasons including his arthritis.  We will check his A1c every 6 months.

## 2017-07-15 NOTE — Progress Notes (Signed)
    CHIEF COMPLAINT / HPI: Here with his son for follow-up.  I saw a new complaint of bilateral hip pain.  Intermittent, occurs 4-5 days a week.  Interrupting his sleep pattern.  Also continues to have right knee pain that is fairly well handled by the pain medication.  The hip pain response to the pain medication but does not get to a point where he is comfortable, particularly at night. 2.  Difficulty maintaining sleep and falling.  He thinks problems falling asleep the pain but he is unclear why he is unable to maintain sleep.  Wakes up about every 2 hours.  Does not feel rested.  Not using CPAP even though he has been  diagnosed with sleep apnea.  Said he could not get used to the mask 3.  Vision: He is almost totally blind now.  Relies on family to help him.  Continues to see ophthalmology. #4.  Has not been able to exercise at all secondary to knee and hip pain as well as apparent by his visual loss. 5.  Hypertension and coronary artery disease: He is not having any unusual shortness of breath, no unusual lower extremity edema. #6.  Diabetes mellitus: Not checking his blood sugars but he said no episodes of low blood sugar.  Taking his medicines correctly.  REVIEW OF SYSTEMS: No unusual urinary frequency, no burning on urination.  Appetite is stable.  No abdominal pain.  No headache.  See HPI above for additional pertinent review of systems.  PERTINENT  PMH / PSH: I have reviewed the patient's medications, allergies, past medical and surgical history, smoking status and updated in the EMR as appropriate.   OBJECTIVE: Vital signs reviewed. GENERAL: Well-developed, well-nourished, no acute distress. CARDIOVASCULAR: Regular rate and rhythm no murmur gallop or rub LUNGS: Clear to auscultation bilaterally, no rales or wheeze. ABDOMEN: Soft positive bowel sounds NEURO: No gross focal neurological deficits except for visual loss.. MSK: Movement of extremity x 4.  Bilateral hips have decreased  range of motion internal and external.  External rotation is extremely painful left greater than right.  Right knee pain medial and lateral joint line.  Neither knee has any sign of effusion.  Bilaterally the calves are soft.    ASSESSMENT / PLAN: Please see problem oriented charting for details

## 2017-07-15 NOTE — Assessment & Plan Note (Signed)
Blood pressures well controlled.

## 2017-07-15 NOTE — Assessment & Plan Note (Signed)
Was unaware that he was not using CPAP at all.  Look back in his chart and he will need a new sleep study.  I have set that up for him and discussed at length with him and his son, emphasize in the absolute importance of this.

## 2017-07-22 ENCOUNTER — Ambulatory Visit
Admission: RE | Admit: 2017-07-22 | Discharge: 2017-07-22 | Disposition: A | Discharge status: Home / Self Care | Payer: Medicare Other | Source: Ambulatory Visit | Attending: Family Medicine | Admitting: Family Medicine

## 2017-07-22 ENCOUNTER — Other Ambulatory Visit: Payer: Self-pay | Admitting: Family Medicine

## 2017-07-22 DIAGNOSIS — M25552 Pain in left hip: Principal | ICD-10-CM

## 2017-07-22 DIAGNOSIS — M160 Bilateral primary osteoarthritis of hip: Secondary | ICD-10-CM | POA: Diagnosis not present

## 2017-07-22 DIAGNOSIS — M25551 Pain in right hip: Secondary | ICD-10-CM

## 2017-07-27 ENCOUNTER — Other Ambulatory Visit: Payer: Self-pay

## 2017-07-28 ENCOUNTER — Encounter: Payer: Self-pay | Admitting: Family Medicine

## 2017-07-28 MED ORDER — COLCHICINE 0.6 MG PO TABS: ORAL_TABLET | ORAL | 1 refills | Status: DC

## 2017-08-05 ENCOUNTER — Ambulatory Visit (HOSPITAL_BASED_OUTPATIENT_CLINIC_OR_DEPARTMENT_OTHER): Payer: Medicare Other

## 2017-08-06 ENCOUNTER — Other Ambulatory Visit: Payer: Self-pay

## 2017-08-06 ENCOUNTER — Ambulatory Visit (INDEPENDENT_AMBULATORY_CARE_PROVIDER_SITE_OTHER): Payer: Medicare Other | Admitting: *Deleted

## 2017-08-06 ENCOUNTER — Telehealth: Payer: Self-pay | Admitting: Cardiology

## 2017-08-06 DIAGNOSIS — Z95810 Presence of automatic (implantable) cardiac defibrillator: Secondary | ICD-10-CM

## 2017-08-06 DIAGNOSIS — I5022 Chronic systolic (congestive) heart failure: Secondary | ICD-10-CM | POA: Diagnosis not present

## 2017-08-06 DIAGNOSIS — I428 Other cardiomyopathies: Secondary | ICD-10-CM

## 2017-08-06 NOTE — Telephone Encounter (Signed)
[name] Aldona Bar calling- requesting refill of patients pain medication. [place] B Akeya Ryther, RN

## 2017-08-06 NOTE — Telephone Encounter (Signed)
LMOVM reminding pt to send remote transmission.   

## 2017-08-07 ENCOUNTER — Telehealth: Payer: Self-pay

## 2017-08-07 MED ORDER — HYDROCODONE-ACETAMINOPHEN 5-325 MG PO TABS
ORAL_TABLET | ORAL | 0 refills | Status: DC
Start: 2017-08-07 — End: 2017-09-16

## 2017-08-07 NOTE — Telephone Encounter (Signed)
Remote ICM transmission received.  Attempted call to patient and left message per DPR to return call.

## 2017-08-07 NOTE — Progress Notes (Signed)
Returned call to patient as requested by voice mail message.  He said he is feeling fine and no complaints at this time.  Transmission reviewed.  No changes today and encouraged to call for fluid symptoms.

## 2017-08-07 NOTE — Progress Notes (Signed)
EPIC Encounter for ICM Monitoring  Patient Name: [name] is a 57 y.o. male Date: 08/07/2017 Primary Care Physican: Dickie La, MD Primary [place] Electrophysiologist: Faustino Congress Weight:295 lbs at 07/15/17 office visit(no scales at home)       Attempted call to patient and unable to reach.  Left message to return call.  Transmission reviewed.    Thoracic impedance normal.  Prescribed dosage: Furosemide 40 mg 1 tablet as needed.   Labs: 03/11/2017 Creatinine 1.75, BUN 24, Potassium 5.2, Sodium 111, EGFR 42-49 10/01/2016 Creatinine 1.72, BUN 21, Potassium 4.8, Sodium 141, EGFR 43-50 05/21/2016 Creatinine 1.44, BUN 18, Potassium 4.6, Sodium 143 11/21/2015 Creatinine 1.53, BUN 16, Potassium 4.5, Sodium 141  11/07/2015 Creatinine 1.64, BUN 20, Potassium 4.6, Sodium 142  08/29/2015 Creatinine 1.19, BUN 12, Potassium 4.3, Sodium 139  05/24/2015 Creatinine 1.40, BUN 18, Potassium 4.2, Sodium 139  04/09/2015 Creatinine 1.40, BUN 18, Potassium 5.0, Sodium 142  Recommendations: NONE - Unable to reach..  Follow-up plan: ICM clinic phone appointment on 09/07/2017.  April recall for yearly appointment with Dr Caryl Comes.   Copy of ICM check sent to Dr. Caryl Comes.   3 month ICM trend: 08/06/2017    1 Year ICM trend:       Rosalene Billings, RN 08/07/2017 11:06 AM

## 2017-08-10 NOTE — Progress Notes (Signed)
Remote ICD transmission.   

## 2017-08-11 ENCOUNTER — Encounter: Payer: Self-pay | Admitting: Cardiology

## 2017-08-16 ENCOUNTER — Ambulatory Visit (HOSPITAL_BASED_OUTPATIENT_CLINIC_OR_DEPARTMENT_OTHER): Payer: Medicare Other | Attending: Family Medicine | Admitting: Internal Medicine

## 2017-08-16 VITALS — Wt 298.0 lb

## 2017-08-16 DIAGNOSIS — G4736 Sleep related hypoventilation in conditions classified elsewhere: Secondary | ICD-10-CM | POA: Insufficient documentation

## 2017-08-16 DIAGNOSIS — R0902 Hypoxemia: Secondary | ICD-10-CM | POA: Diagnosis not present

## 2017-08-16 DIAGNOSIS — G4733 Obstructive sleep apnea (adult) (pediatric): Secondary | ICD-10-CM | POA: Insufficient documentation

## 2017-08-19 DIAGNOSIS — G4733 Obstructive sleep apnea (adult) (pediatric): Secondary | ICD-10-CM | POA: Diagnosis not present

## 2017-08-19 DIAGNOSIS — E119 Type 2 diabetes mellitus without complications: Secondary | ICD-10-CM | POA: Diagnosis not present

## 2017-08-19 DIAGNOSIS — E785 Hyperlipidemia, unspecified: Secondary | ICD-10-CM | POA: Diagnosis not present

## 2017-08-19 DIAGNOSIS — I420 Dilated cardiomyopathy: Secondary | ICD-10-CM | POA: Diagnosis not present

## 2017-08-19 DIAGNOSIS — I119 Hypertensive heart disease without heart failure: Secondary | ICD-10-CM | POA: Diagnosis not present

## 2017-08-20 LAB — CUP PACEART REMOTE DEVICE CHECK
Battery Remaining Longevity: 120 mo
Battery Voltage: 3.01 V
Brady Statistic RV Percent Paced: 0.01 %
Date Time Interrogation Session: 20190502194821
HighPow Impedance: 40 Ohm
HighPow Impedance: 50 Ohm
Implantable Lead Implant Date: 20081114
Implantable Lead Location: 753860
Implantable Lead Model: 6947
Implantable Pulse Generator Implant Date: 20170222
Lead Channel Impedance Value: 380 Ohm
Lead Channel Impedance Value: 399 Ohm
Lead Channel Pacing Threshold Amplitude: 0.375 V
Lead Channel Pacing Threshold Pulse Width: 0.4 ms
Lead Channel Sensing Intrinsic Amplitude: 5.625 mV
Lead Channel Sensing Intrinsic Amplitude: 5.625 mV
Lead Channel Setting Pacing Amplitude: 2.5 V
Lead Channel Setting Pacing Pulse Width: 0.4 ms
Lead Channel Setting Sensing Sensitivity: 0.3 mV

## 2017-08-26 ENCOUNTER — Encounter: Payer: Self-pay | Admitting: Family Medicine

## 2017-08-26 ENCOUNTER — Ambulatory Visit: Payer: Medicare Other | Admitting: Family Medicine

## 2017-08-26 ENCOUNTER — Other Ambulatory Visit: Payer: Self-pay

## 2017-08-26 ENCOUNTER — Ambulatory Visit (INDEPENDENT_AMBULATORY_CARE_PROVIDER_SITE_OTHER): Payer: Medicare Other | Admitting: Family Medicine

## 2017-08-26 ENCOUNTER — Telehealth: Payer: Self-pay | Admitting: Family Medicine

## 2017-08-26 VITALS — BP 92/52 | HR 57 | Temp 98.0°F | Ht 67.0 in | Wt 291.0 lb

## 2017-08-26 DIAGNOSIS — G4733 Obstructive sleep apnea (adult) (pediatric): Secondary | ICD-10-CM

## 2017-08-26 DIAGNOSIS — I428 Other cardiomyopathies: Secondary | ICD-10-CM | POA: Diagnosis not present

## 2017-08-26 DIAGNOSIS — M170 Bilateral primary osteoarthritis of knee: Secondary | ICD-10-CM

## 2017-08-26 DIAGNOSIS — N183 Chronic kidney disease, stage 3 unspecified: Secondary | ICD-10-CM

## 2017-08-26 DIAGNOSIS — E1165 Type 2 diabetes mellitus with hyperglycemia: Secondary | ICD-10-CM

## 2017-08-26 DIAGNOSIS — IMO0002 Reserved for concepts with insufficient information to code with codable children: Secondary | ICD-10-CM

## 2017-08-26 DIAGNOSIS — H5410 Blindness, one eye, low vision other eye, unspecified eyes: Secondary | ICD-10-CM

## 2017-08-26 DIAGNOSIS — E118 Type 2 diabetes mellitus with unspecified complications: Secondary | ICD-10-CM

## 2017-08-26 LAB — POCT GLYCOSYLATED HEMOGLOBIN (HGB A1C): HbA1c, POC (controlled diabetic range): 6.6 % (ref 0.0–7.0)

## 2017-08-26 LAB — POCT HEMOGLOBIN: Hemoglobin: 11.7 g/dL — AB (ref 14.1–18.1)

## 2017-08-26 NOTE — Assessment & Plan Note (Signed)
Vision is essentially gone.  He can see light and dark and that is about it.  He will need transportation assistance and possibly will qualify for personal care services.  I filled out his form today for transportation assistance.  I will start the process for personal care service evaluation

## 2017-08-26 NOTE — Telephone Encounter (Signed)
Dear Dema Severin Team I have put in a referral for his CPAP titration study.  Please let him know this.  I had erroneously assumed that they would take care of this when they did his sleep study ;they did not do the CPAP titration and unfortunately he will have to go back for that.  Please apologize on my behalf for the added visit he will have to make.  I signed the order/referral.  I think the sleep center takes care of the scheduling.  If that is incorrect, please let me know what else I need to do.  Mostly I want him to know that we has seen in a request for them to schedule his titration.  Hopefully eventually will get him a CPAP machine and get this all settled THANKS! THANKS! [name]

## 2017-08-26 NOTE — Progress Notes (Signed)
    CHIEF COMPLAINT / HPI: #1.  Has not heard back from his CPAP study.  Said he did get it done. 2.  Patient is almost totally gone.  Paperwork he needs me to fill out for his transportation. 3.  Wants to investigate getting some personal care services at home as he is having difficulty with everyday activity secondary to his vision loss. #4.  Not having any unusual chest pain or shortness of breath.  No lower extremity edema that is unusual. #5.  At last visit he was having fairly significant hip pain.  That has been much better over the last 2 weeks.  Thanks for the reason it is better is because he was taking his pain medicine but he is backed off to the original dose now on still having pretty good pain relief so his conclusion is that whatever was bothering his hip has resolved.  He does continue to have chronic knee pain.  REVIEW OF SYSTEMS: See HPI.  Additionally, denies chest pain.  PERTINENT  PMH / PSH: I have reviewed the patient's medications, allergies, past medical and surgical history, smoking status and updated in the EMR as appropriate.   OBJECTIVE: GENERAL: Morbidly obese male no acute distress  HEENT: He can tell light and dark but other than that essentially has no vision.  Conjunctive is nonicteric.  Neck is without lymphadenopathy.   CV: Regular rate and rhythm without murmur LUNGS: There to auscultation bilaterally.  No rales, no wheeze MSK: Movement of extremity x4.  Normal muscle bulk and tone. Psychiatric: Alert and oriented x4.  Affect is interactive.  Speech is normal in fluency and content.  Judgment is normal.  Recent and remote is intact.  ASSESSMENT / PLAN: Please see problem oriented charting for details

## 2017-08-26 NOTE — Patient Instructions (Signed)
Let me see you back in 3 months I will send paperwork in to get an evaluation for personal care services

## 2017-08-26 NOTE — Assessment & Plan Note (Signed)
He seems fairly stable.  Unfortunately with his continued decline in vision, his ability to exercise is impaired even further.

## 2017-08-26 NOTE — Assessment & Plan Note (Signed)
Stable.  We will continue chronic pain medicines

## 2017-08-26 NOTE — Procedures (Signed)
   Patient Name: [name], Zielke Date: 08/16/2017 Gender: Male D.O.B: Mar 28, 1961 Age (years): 56 Referring Provider: [name] Height (inches): 59 Interpreting Physician: Baird Lyons MD, ABSM Weight (lbs): 298 RPSGT: Lanae Boast BMI: 56 MRN: [id number] Neck Size: 18.00  CLINICAL INFORMATION Sleep Study Type: NPSG Indication for sleep study: Diabetes, Obesity, Witnesses Apnea / Gasping During Sleep  Epworth Sleepiness Score: 7  SLEEP STUDY TECHNIQUE As per the AASM Manual for the Scoring of Sleep and Associated Events v2.3 (April 2016) with a hypopnea requiring 4% desaturations.  The channels recorded and monitored were frontal, central and occipital EEG, electrooculogram (EOG), submentalis EMG (chin), nasal and oral airflow, thoracic and abdominal wall motion, anterior tibialis EMG, snore microphone, electrocardiogram, and pulse oximetry.  MEDICATIONS Medications self-administered by patient taken the night of the study : none reported  SLEEP ARCHITECTURE The study was initiated at 10:24:30 PM and ended at 4:59:15 AM.  Sleep onset time was 2.7 minutes and the sleep efficiency was 66.4%%. The total sleep time was 262.1 minutes.  Stage REM latency was 164.5 minutes.  The patient spent 11.6%% of the night in stage N1 sleep, 70.6%% in stage N2 sleep, 0.0%% in stage N3 and 17.74% in REM.  Alpha intrusion was absent.  Supine sleep was 66.97%.  RESPIRATORY PARAMETERS The overall apnea/hypopnea index (AHI) was 21.8 per hour. There were 36 total apneas, including 35 obstructive, 1 central and 0 mixed apneas. There were 59 hypopneas and 42 RERAs.  The AHI during Stage REM sleep was 45.2 per hour.  AHI while supine was 24.6 per hour.  The mean oxygen saturation was 94.1%. The minimum SpO2 during sleep was 68.0%.  moderate snoring was noted during this study.  CARDIAC DATA The 2 lead EKG demonstrated sinus rhythm. The mean heart rate was 59.4 beats per  minute. Other EKG findings include: None.  LEG MOVEMENT DATA The total PLMS were 0 with a resulting PLMS index of 0.0. Associated arousal with leg movement index was 0.0 .  IMPRESSIONS - Moderate obstructive sleep apnea occurred during this study (AHI = 21.8/h). - No significant central sleep apnea occurred during this study (CAI = 0.2/h). - Oxygen desaturation was noted during this study (Min O2 = 68.0%). - The patient snored with moderate snoring volume. - No cardiac abnormalities were noted during this study. - Clinically significant periodic limb movements did not occur during sleep. No significant associated arousals. \ DIAGNOSIS - Obstructive Sleep Apnea (327.23 [G47.33 ICD-10]) - Nocturnal Hypoxemia (327.26 [G47.36 ICD-10])  RECOMMENDATIONS - Suggest CPAP titration study or DME autopap. Other options would be based on clinical judgment. - Be careful with alcohol, sedatives and other CNS depressants that may worsen sleep apnea and disrupt normal sleep architecture. - Sleep hygiene should be reviewed to assess factors that may improve sleep quality. - Weight management and regular exercise should be initiated or continued if appropriate.  [Electronically signed] 08/26/2017 02:57 PM  Baird Lyons MD, [place], American Board of Sleep Medicine   NPI: [id number]                          [place], [place] [facility]  ELECTRONICALLY SIGNED ON:  08/26/2017, 2:55 PM [place] PH: [phone]   FX: [phone] [place]

## 2017-08-26 NOTE — Telephone Encounter (Signed)
Pt has been informed. He understands and will do the titration study. Ottis Stain, CMA

## 2017-08-27 ENCOUNTER — Telehealth: Payer: Self-pay | Admitting: Family Medicine

## 2017-08-27 ENCOUNTER — Telehealth: Payer: Self-pay | Admitting: Licensed Clinical Social Worker

## 2017-08-27 DIAGNOSIS — N183 Chronic kidney disease, stage 3 unspecified: Secondary | ICD-10-CM

## 2017-08-27 LAB — CMP14+EGFR
ALT: 7 IU/L (ref 0–44)
AST: 6 IU/L (ref 0–40)
Albumin/Globulin Ratio: 1.4 (ref 1.2–2.2)
Albumin: 4 g/dL (ref 3.5–5.5)
Alkaline Phosphatase: 46 IU/L (ref 39–117)
BUN/Creatinine Ratio: 17 (ref 9–20)
BUN: 32 mg/dL — ABNORMAL HIGH (ref 6–24)
Bilirubin Total: 0.3 mg/dL (ref 0.0–1.2)
CO2: 18 mmol/L — ABNORMAL LOW (ref 20–29)
Calcium: 9.1 mg/dL (ref 8.7–10.2)
Chloride: 112 mmol/L — ABNORMAL HIGH (ref 96–106)
Creatinine, Ser: 1.91 mg/dL — ABNORMAL HIGH (ref 0.76–1.27)
GFR calc Af Amer: 44 mL/min/{1.73_m2} — ABNORMAL LOW (ref >59)
GFR calc non Af Amer: 38 mL/min/{1.73_m2} — ABNORMAL LOW (ref >59)
Globulin, Total: 2.8 g/dL (ref 1.5–4.5)
Glucose: 109 mg/dL — ABNORMAL HIGH (ref 65–99)
Potassium: 5.6 mmol/L — ABNORMAL HIGH (ref 3.5–5.2)
Sodium: 141 mmol/L (ref 134–144)
Total Protein: 6.8 g/dL (ref 6.0–8.5)

## 2017-08-27 NOTE — Telephone Encounter (Signed)
Pt informed and will call to schedule an appointment once he arranges a ride. Katharina Caper, Lajada Janes D, [place]

## 2017-08-27 NOTE — Telephone Encounter (Signed)
Dear [name] Team Can you call him and have him come for a recheck of blood work next week. A couple of the electrolytes are a bit off--nothing terrible but ask him if he can come for a lab only next week. Make no med changes.  Let me know if he has questions. I am off campus today but will be back tomorrow and I will be checking my messages during the day today. I will pu order in. No need to fast THANKS! [name]

## 2017-08-27 NOTE — Progress Notes (Signed)
Type of Service: Clinical Social Work  Social work consult from Dr. Nori Riis reference assisting with [place] Referral.   LCSW spoke with patient, he does not received Medicaid which is required for PCS rerferrals.  Per patient he does not need help with ADL's.  His main concerns is transportation and housekeeping. The following was discussed; Patient applied for medicaid was not eligible and other community options .  Patient appreciative of talking with LCSW.  Update provided to PCP via in basket message.  Plan:  1. LCSW will mail patient community resources with non medicaid services in home service via DSS and transportation resources. 2. Patient will get ex-wife to assist him with making the call when information is received.  Adam Lanius, LCSW Licensed Clinical Social Worker [place]   [phone] 10:30 AM

## 2017-09-07 ENCOUNTER — Ambulatory Visit (INDEPENDENT_AMBULATORY_CARE_PROVIDER_SITE_OTHER): Payer: Medicare Other

## 2017-09-07 DIAGNOSIS — I5022 Chronic systolic (congestive) heart failure: Secondary | ICD-10-CM

## 2017-09-07 DIAGNOSIS — Z95810 Presence of automatic (implantable) cardiac defibrillator: Secondary | ICD-10-CM | POA: Diagnosis not present

## 2017-09-08 ENCOUNTER — Telehealth: Payer: Self-pay

## 2017-09-08 ENCOUNTER — Other Ambulatory Visit: Payer: Medicare Other

## 2017-09-08 NOTE — Progress Notes (Signed)
EPIC Encounter for ICM Monitoring  Patient Name: [name] is a 57 y.o. male Date: 09/08/2017 Primary Care Physican: Dickie La, MD Primary [place] Electrophysiologist: Faustino Congress Weight:291 lbs at 08/26/17 office visit(no scales at home)      Attempted call to patient and unable to reach.   Transmission reviewed.    Thoracic impedance abnormal suggesting fluid accumulation since 09/01/2017.  Prescribed dosage: Furosemide 40 mg 1 tablet as needed.   Labs: 08/26/2017 Creatinine 1.91, BUN 32, Potassium 5.6, Sodium 141, EGFR 38-44 - Repeat Labs will be done 09/08/2017 per PCP. 03/11/2017 Creatinine 1.75, BUN 24, Potassium 5.2, Sodium 111, EGFR 42-49 10/01/2016 Creatinine 1.72, BUN 21, Potassium 4.8, Sodium 141, EGFR 43-50 05/21/2016 Creatinine 1.44, BUN 18, Potassium 4.6, Sodium 143 11/21/2015 Creatinine 1.53, BUN 16, Potassium 4.5, Sodium 141  11/07/2015 Creatinine 1.64, BUN 20, Potassium 4.6, Sodium 142  08/29/2015 Creatinine 1.19, BUN 12, Potassium 4.3, Sodium 139  05/24/2015 Creatinine 1.40, BUN 18, Potassium 4.2, Sodium 139  04/09/2015 Creatinine 1.40, BUN 18, Potassium 5.0, Sodium 142  Recommendations: NONE - Unable to reach.  Follow-up plan: ICM clinic phone appointment on 09/11/2017 to recheck fluid levels.   Over due to make an appointment with Dr Caryl Comes.  Copy of ICM check sent to Dr. Caryl Comes.   3 month ICM trend: 09/07/2017    1 Year ICM trend:       Rosalene Billings, RN 09/08/2017 10:26 AM

## 2017-09-08 NOTE — Telephone Encounter (Signed)
Remote ICM transmission received.  Attempted call to patient and left message to return call. 

## 2017-09-09 ENCOUNTER — Telehealth: Payer: Self-pay | Admitting: Family Medicine

## 2017-09-09 NOTE — Telephone Encounter (Signed)
Pt would like refill for his Hydrocodone sent to the pharmacy. Pt would also like to be contacted when medication has been sent.

## 2017-09-11 ENCOUNTER — Ambulatory Visit (INDEPENDENT_AMBULATORY_CARE_PROVIDER_SITE_OTHER): Payer: Self-pay

## 2017-09-11 ENCOUNTER — Telehealth: Payer: Self-pay | Admitting: Cardiology

## 2017-09-11 DIAGNOSIS — I5022 Chronic systolic (congestive) heart failure: Secondary | ICD-10-CM

## 2017-09-11 DIAGNOSIS — Z95810 Presence of automatic (implantable) cardiac defibrillator: Secondary | ICD-10-CM

## 2017-09-11 NOTE — Progress Notes (Signed)
EPIC Encounter for ICM Monitoring  Patient Name: [name] is a 57 y.o. male Date: 09/11/2017 Primary Care Physican: Dickie La, MD Primary [place] Electrophysiologist: Faustino Congress Weight:291lbs at 5/22/19office visit(no scales at home)       Heart Failure questions reviewed, pt asymptomatic.  He stated he is feeling fine.    Thoracic impedance returned normal since last ICM remote transmission on 09/07/2017.  Prescribed dosage: Furosemide 40 mg 1 tablet as needed.   Labs: 08/26/2017 Creatinine 1.91, BUN 32, Potassium 5.6, Sodium 141, EGFR 38-44 - Repeat Labs will be done 09/08/2017 per PCP. 03/11/2017 Creatinine 1.75, BUN 24, Potassium 5.2, Sodium 111, EGFR 42-49 10/01/2016 Creatinine 1.72, BUN 21, Potassium 4.8, Sodium 141, EGFR 43-50 05/21/2016 Creatinine 1.44, BUN 18, Potassium 4.6, Sodium 143 11/21/2015 Creatinine 1.53, BUN 16, Potassium 4.5, Sodium 141  11/07/2015 Creatinine 1.64, BUN 20, Potassium 4.6, Sodium 142  08/29/2015 Creatinine 1.19, BUN 12, Potassium 4.3, Sodium 139  05/24/2015 Creatinine 1.40, BUN 18, Potassium 4.2, Sodium 139  04/09/2015 Creatinine 1.40, BUN 18, Potassium 5.0, Sodium 142  Recommendations: No changes.   Encouraged to call for fluid symptoms.  Follow-up plan: ICM clinic phone appointment on 10/15/2017.    Copy of ICM check sent to Dr. Caryl Comes.   3 month ICM trend: 09/11/2017    1 Year ICM trend:       Rosalene Billings, RN 09/11/2017 4:52 PM

## 2017-09-11 NOTE — Telephone Encounter (Signed)
Spoke with pt and reminded pt of remote transmission that is due today. Pt verbalized understanding.   

## 2017-09-14 ENCOUNTER — Ambulatory Visit (HOSPITAL_BASED_OUTPATIENT_CLINIC_OR_DEPARTMENT_OTHER): Payer: Medicare Other | Attending: Family Medicine | Admitting: Internal Medicine

## 2017-09-14 VITALS — Ht 67.0 in | Wt 298.0 lb

## 2017-09-14 DIAGNOSIS — G4733 Obstructive sleep apnea (adult) (pediatric): Secondary | ICD-10-CM | POA: Diagnosis not present

## 2017-09-15 ENCOUNTER — Other Ambulatory Visit: Payer: Medicare Other

## 2017-09-16 ENCOUNTER — Other Ambulatory Visit: Payer: Medicare Other

## 2017-09-16 ENCOUNTER — Telehealth: Payer: Self-pay | Admitting: Family Medicine

## 2017-09-16 DIAGNOSIS — N183 Chronic kidney disease, stage 3 unspecified: Secondary | ICD-10-CM

## 2017-09-16 MED ORDER — HYDROCODONE-ACETAMINOPHEN 5-325 MG PO TABS: ORAL_TABLET | ORAL | 0 refills | Status: DC

## 2017-09-16 NOTE — Telephone Encounter (Signed)
Pt in office today stating he needs his refill on his medication.  Said he requested last week and had not heard anything and he is out now. Requested a call to let him know when completed. Katharina Caper, April D, [place]

## 2017-09-16 NOTE — Telephone Encounter (Signed)
Dear Dema Severin Team Please let him know I have sent it in [facility]! [name]

## 2017-09-16 NOTE — Telephone Encounter (Signed)
LVM for pt to call the office. Ottis Stain, CMA

## 2017-09-17 LAB — BASIC METABOLIC PANEL
BUN/Creatinine Ratio: 11 (ref 9–20)
BUN: 18 mg/dL (ref 6–24)
CO2: 19 mmol/L — ABNORMAL LOW (ref 20–29)
Calcium: 9.2 mg/dL (ref 8.7–10.2)
Chloride: 109 mmol/L — ABNORMAL HIGH (ref 96–106)
Creatinine, Ser: 1.66 mg/dL — ABNORMAL HIGH (ref 0.76–1.27)
GFR calc Af Amer: 52 mL/min/{1.73_m2} — ABNORMAL LOW (ref >59)
GFR calc non Af Amer: 45 mL/min/{1.73_m2} — ABNORMAL LOW (ref >59)
Glucose: 107 mg/dL — ABNORMAL HIGH (ref 65–99)
Potassium: 5.4 mmol/L — ABNORMAL HIGH (ref 3.5–5.2)
Sodium: 140 mmol/L (ref 134–144)

## 2017-09-17 NOTE — Telephone Encounter (Signed)
2nd attempt to reach pt to inform him of below. Katharina Caper, April D, [place]

## 2017-09-25 DIAGNOSIS — G4733 Obstructive sleep apnea (adult) (pediatric): Secondary | ICD-10-CM | POA: Diagnosis not present

## 2017-09-25 NOTE — Procedures (Signed)
   Patient Name: [name], [name] Date: 09/14/2017 Gender: Male D.O.B: [birth year] Age (years): 56 Referring Provider: [name] Height (inches): 61 Interpreting Physician: Baird Lyons MD, ABSM Weight (lbs): 298 RPSGT: Zadie Rhine BMI: 56 MRN: [id number] Neck Size: 18.00  CLINICAL INFORMATION The patient is referred for a CPAP titration to treat sleep apnea. Date of NPSG, Split Night or HST:  NPSG 08/16/17    AHI 21.8/ hr, desaturation to 68%, body weight 298 lbs  SLEEP STUDY TECHNIQUE As per the AASM Manual for the Scoring of Sleep and Associated Events v2.3 (April 2016) with a hypopnea requiring 4% desaturations.  The channels recorded and monitored were frontal, central and occipital EEG, electrooculogram (EOG), submentalis EMG (chin), nasal and oral airflow, thoracic and abdominal wall motion, anterior tibialis EMG, snore microphone, electrocardiogram, and pulse oximetry. Continuous positive airway pressure (CPAP) was initiated at the beginning of the study and titrated to treat sleep-disordered breathing.  MEDICATIONS Medications self-administered by patient taken the night of the study : N/A  TECHNICIAN COMMENTS Comments added by technician: NO RESTROOM VISTED. Patient had difficulty initiating sleep. Comments added by scorer: N/A  RESPIRATORY PARAMETERS Optimal PAP Pressure (cm): 13 AHI at Optimal Pressure (/hr): 2.9 Overall Minimal O2 (%): 89.0 Supine % at Optimal Pressure (%): 76 Minimal O2 at Optimal Pressure (%): 95.0   SLEEP ARCHITECTURE The study was initiated at 9:45:18 PM and ended at 3:39:34 AM.  Sleep onset time was 6.5 minutes and the sleep efficiency was 67.7%%. The total sleep time was 240 minutes.  The patient spent 3.1%% of the night in stage N1 sleep, 72.9%% in stage N2 sleep, 0.2%% in stage N3 and 23.75% in REM.Stage REM latency was 70.0 minutes  Wake after sleep onset was 107.8. Alpha intrusion was absent. Supine sleep was 6.64%.  CARDIAC  DATA The 2 lead EKG demonstrated sinus rhythm. The mean heart rate was 55.7 beats per minute. Other EKG findings include: PVCs.  LEG MOVEMENT DATA The total Periodic Limb Movements of Sleep (PLMS) were 0. The PLMS index was 0.0. A PLMS index of <15 is considered normal in adults.  IMPRESSIONS - The optimal PAP pressure was 13 cm of water. - Central sleep apnea was not noted during this titration (CAI = 1.8/h). - Mild oxygen desaturations were observed during this titration (Min at CPAP 13 was 95%). - No snoring was audible during this study. - 2-lead EKG demonstrated: PVCs - Clinically significant periodic limb movements were not noted during this study. Arousals associated with PLMs were rare.  DIAGNOSIS - Obstructive Sleep Apnea (327.23 [G47.33 ICD-10])  RECOMMENDATIONS - Trial of CPAP therapy on 13 cm H2O or Autopap 10-15. Patient used a Wide size Resmed Nasal Mask [place] FX mask and heated humidification. - Be careful with alcohol, sedatives and other CNS depressants that may worsen sleep apnea and disrupt normal sleep architecture. - Sleep hygiene should be reviewed to assess factors that may improve sleep quality. - Weight management and regular exercise should be initiated or continued.  [Electronically signed] 09/25/2017 02:47 PM  Baird Lyons MD, [place], American Board of Sleep Medicine   NPI: [id number]                          [place], [place] [facility]  ELECTRONICALLY SIGNED ON:  09/25/2017, 2:48 PM [place] PH: [phone]   FX: [phone] [place]

## 2017-09-28 DIAGNOSIS — I5020 Unspecified systolic (congestive) heart failure: Secondary | ICD-10-CM | POA: Diagnosis not present

## 2017-09-28 DIAGNOSIS — E785 Hyperlipidemia, unspecified: Secondary | ICD-10-CM | POA: Diagnosis not present

## 2017-09-28 DIAGNOSIS — I10 Essential (primary) hypertension: Secondary | ICD-10-CM | POA: Diagnosis not present

## 2017-09-28 DIAGNOSIS — I420 Dilated cardiomyopathy: Secondary | ICD-10-CM | POA: Diagnosis not present

## 2017-09-28 DIAGNOSIS — G4733 Obstructive sleep apnea (adult) (pediatric): Secondary | ICD-10-CM | POA: Diagnosis not present

## 2017-09-28 DIAGNOSIS — E119 Type 2 diabetes mellitus without complications: Secondary | ICD-10-CM | POA: Diagnosis not present

## 2017-10-01 ENCOUNTER — Telehealth: Payer: Self-pay | Admitting: *Deleted

## 2017-10-01 DIAGNOSIS — G4733 Obstructive sleep apnea (adult) (pediatric): Secondary | ICD-10-CM

## 2017-10-01 NOTE — Telephone Encounter (Signed)
Wants to know the next step in getting his CPAP machine.   Will forward to MD as I do not see any recent orders for a machine. [name], [name], CMA

## 2017-10-02 ENCOUNTER — Telehealth: Payer: Self-pay | Admitting: *Deleted

## 2017-10-02 NOTE — Telephone Encounter (Signed)
Faxed order over for CPAP to [facility] and sent message to [facility] as well. Katharina Caper, Daegen Berrocal D, [place]

## 2017-10-02 NOTE — Telephone Encounter (Signed)
I have signed order and am placing it on your physical desk top--can you fax it for me? THANKS! [name]

## 2017-10-14 ENCOUNTER — Other Ambulatory Visit: Payer: Self-pay | Admitting: Family Medicine

## 2017-10-14 MED ORDER — HYDROCODONE-ACETAMINOPHEN 5-325 MG PO TABS: ORAL_TABLET | ORAL | 0 refills | Status: DC

## 2017-10-15 ENCOUNTER — Ambulatory Visit (INDEPENDENT_AMBULATORY_CARE_PROVIDER_SITE_OTHER): Payer: Medicare Other

## 2017-10-15 DIAGNOSIS — Z95810 Presence of automatic (implantable) cardiac defibrillator: Secondary | ICD-10-CM | POA: Diagnosis not present

## 2017-10-15 DIAGNOSIS — I5022 Chronic systolic (congestive) heart failure: Secondary | ICD-10-CM | POA: Diagnosis not present

## 2017-10-16 ENCOUNTER — Telehealth: Payer: Self-pay

## 2017-10-16 NOTE — Telephone Encounter (Signed)
Remote ICM transmission received.  Attempted call to patient and message to return call

## 2017-10-16 NOTE — Progress Notes (Signed)
Returned patient call as requested by voice mail message.  Advised transmission suggests he may have fluid and he stated he feels fine. Has not check weight at home.  Advised to take 1 PRN Furosemide tablet tomorrow to resolve rest of fluid accumulation.  Next ICM remote transmission 12/08/2017.  Advised to complete DPR form at next office visit on 11/05/2017.

## 2017-10-16 NOTE — Progress Notes (Signed)
EPIC Encounter for ICM Monitoring  Patient Name: [name] is a 57 y.o. male Date: 10/16/2017 Primary Care Physican: Dickie La, MD Primary [place] Electrophysiologist: Caryl Comes Dry Weight:291lbs at5/22/19office visit(no scales at home)      Attempted call to patient and unable to reach.  Left message to return call.  Transmission reviewed.    Thoracic impedance abnormal suggesting fluid accumulation but trending back to baseline.  Prescribed dosage: Furosemide 40 mg 1 tablet as needed.   Labs: 08/26/2017 Creatinine 1.91, BUN 32, Potassium 5.6, Sodium 141, EGFR 38-44 - Repeat Labs will be done 09/08/2017 per PCP. 03/11/2017 Creatinine 1.75, BUN 24, Potassium 5.2, Sodium 111, EGFR 42-49 10/01/2016 Creatinine 1.72, BUN 21, Potassium 4.8, Sodium 141, EGFR 43-50 05/21/2016 Creatinine 1.44, BUN 18, Potassium 4.6, Sodium 143 11/21/2015 Creatinine 1.53, BUN 16, Potassium 4.5, Sodium 141  11/07/2015 Creatinine 1.64, BUN 20, Potassium 4.6, Sodium 142  08/29/2015 Creatinine 1.19, BUN 12, Potassium 4.3, Sodium 139  05/24/2015 Creatinine 1.40, BUN 18, Potassium 4.2, Sodium 139  04/09/2015 Creatinine 1.40, BUN 18, Potassium 5.0, Sodium 142  Recommendations: NONE - Unable to reach.  Follow-up plan: ICM clinic phone appointment on 12/08/2017.   Office appointment scheduled 11/05/2017 with Chanetta Marshall, NP.    Copy of ICM check sent to Dr. Caryl Comes.   3 month ICM trend: 10/15/2017    1 Year ICM trend:       Rosalene Billings, RN 10/16/2017 11:25 AM

## 2017-10-21 ENCOUNTER — Other Ambulatory Visit: Payer: Self-pay | Admitting: Family Medicine

## 2017-11-05 ENCOUNTER — Encounter: Payer: Self-pay | Admitting: Nurse Practitioner

## 2017-11-05 ENCOUNTER — Encounter: Payer: Medicare Other | Admitting: Nurse Practitioner

## 2017-11-05 DIAGNOSIS — R0989 Other specified symptoms and signs involving the circulatory and respiratory systems: Secondary | ICD-10-CM

## 2017-11-05 NOTE — Progress Notes (Deleted)
Electrophysiology Office Note Date: 11/05/2017  ID:  [name], DOB Oct 06, 1960, MRN [id number]  PCP: Dickie La, MD Electrophysiologist: Caryl Comes  CC: Routine ICD follow-up  [name] is a 57 y.o. male seen today for Dr Caryl Comes.  He presents today for routine electrophysiology followup.  Since last being seen in our clinic, the patient reports doing very well. He denies chest pain, palpitations, dyspnea, PND, orthopnea, nausea, vomiting, dizziness, syncope, edema, weight gain, or early satiety.  He has not had ICD shocks.   Device History: MDT single chamber ICD implanted 2008 for NICM; gen change 2017 History of appropriate therapy: No History of AAD therapy: No   Past Medical History:  Diagnosis Date  . Arthritis   . Cardiomyopathy    with borderline ejection fraction in this 57 year old male  . Cataracts, bilateral    right  . Chest pain   . Congestive heart failure ([place])   . Diabetes mellitus    does not check blood sugars at home  . Diverticulosis   . Glaucoma    bilateral  . H/O TIA (transient ischemic attack) and stroke    with heart issues  . Hyperlipidemia   . Hypertension   . Implantable cardiac defibrillator -[employer]tronic    02/19/07, Dr. Lovena Le  . Severe hypertension    Past Surgical History:  Procedure Laterality Date  . CARDIAC CATHETERIZATION  10/24/2003   EF of 45-50%  . CARDIAC DEFIBRILLATOR PLACEMENT  2008  . CATARACT EXTRACTION  2012   right  . EP IMPLANTABLE DEVICE N/A 05/30/2015   Procedure: ICD Generator Changeout;  Surgeon: [name], MD;  Location: [facility];  Service: Cardiovascular;  Laterality: N/A;  . KNEE SURGERY Right 2000  . SHOULDER SURGERY  2012   left    Current Outpatient Medications  Medication Sig Dispense Refill  . acetaZOLAMIDE (DIAMOX) 500 MG capsule BID per opthalmology    . amLODipine (NORVASC) 10 MG tablet Take 0.5 tablets (5 mg total) by mouth daily.    Marland Kitchen aspirin 81 MG tablet Take 81 mg by  mouth daily.    . carvedilol (COREG) 25 MG tablet Take 1 tablet (25 mg total) by mouth 2 (two) times daily with a meal.    . colchicine (COLCRYS) 0.6 MG tablet TAKE ONE TABLET BY MOUTH EVERY DAY FOR FOR PAIN in FEET AS NEEDED 30 tablet 1  . EPINEPHRINE 0.3 mg/0.3 mL IJ SOAJ injection INJECT 0.3MLS INTO THE MUSCLE AS NEEDED 1 Device 1  . furosemide (LASIX) 40 MG tablet Take one by mouth daily as needed for fluid overload 90 tablet 3  . glipiZIDE (GLUCOTROL) 5 MG tablet Take 1 TABLET BY MOUTH TWICE DAILY before meal(s) 180 tablet 3  . HYDROcodone-acetaminophen (NORCO/VICODIN) 5-325 MG tablet Take by mouth 2 in AM 1 at midday and 2 at bedtime 150 tablet 0  . isosorbide mononitrate (ISMO,MONOKET) 20 MG tablet TAKE ONE TABLET BY MOUTH TWICE DAILY 180 tablet 3  . loratadine (CLARITIN) 10 MG tablet Take 1 tablet (10 mg total) by mouth daily as needed for allergies. 30 tablet 11  . metFORMIN (GLUCOPHAGE) 1000 MG tablet Take 1 tablet (1,000 mg total) by mouth 2 (two) times daily with a meal. 180 tablet 6  . metroNIDAZOLE (FLAGYL) 500 MG tablet Take 1 tablet (500 mg total) by mouth 2 (two) times daily. 14 tablet 0  . pravastatin (PRAVACHOL) 80 MG tablet Take 80 mg by mouth at bedtime.     Marland Kitchen  sacubitril-valsartan (ENTRESTO) 49-51 MG Take 1 tablet by mouth 2 (two) times daily.    Marland Kitchen spironolactone (ALDACTONE) 25 MG tablet Take 25 mg by mouth 2 (two) times daily.      No current facility-administered medications for this visit.     Allergies:   Bee pollen and Nsaids   Social History: Social History   Socioeconomic History  . Marital status: Legally Separated    Spouse name: Not on file  . Number of children: Not on file  . Years of education: Not on file  . Highest education level: Not on file  Occupational History  . Not on file  Social Needs  . Financial resource strain: Not on file  . Food insecurity:    Worry: Not on file    Inability: Not on file  . Transportation needs:    Medical: Not on  file    Non-medical: Not on file  Tobacco Use  . Smoking status: Never Smoker  . Smokeless tobacco: Never Used  Substance and Sexual Activity  . Alcohol use: No  . Drug use: No  . Sexual activity: Not on file  Lifestyle  . Physical activity:    Days per week: Not on file    Minutes per session: Not on file  . Stress: Not on file  Relationships  . Social connections:    Talks on phone: Not on file    Gets together: Not on file    Attends religious service: Not on file    Active member of club or organization: Not on file    Attends meetings of clubs or organizations: Not on file    Relationship status: Not on file  . Intimate partner violence:    Fear of current or ex partner: Not on file    Emotionally abused: Not on file    Physically abused: Not on file    Forced sexual activity: Not on file  Other Topics Concern  . Not on file  Social History Narrative  . Not on file    Family History: Family History  Problem Relation Age of Onset  . Colon cancer Father   . Esophageal cancer Neg Hx   . Rectal cancer Neg Hx   . Stomach cancer Neg Hx     Review of Systems: All other systems reviewed and are otherwise negative except as noted above.   Physical Exam: VS:  There were no vitals taken for this visit. , BMI There is no height or weight on file to calculate BMI.  GEN- The patient is well appearing, alert and oriented x 3 today.   HEENT: normocephalic, atraumatic; sclera clear, conjunctiva pink; hearing intact; oropharynx clear; neck supple, no JVP Lymph- no cervical lymphadenopathy Lungs- Clear to ausculation bilaterally, normal work of breathing.  No wheezes, rales, rhonchi Heart- Regular rate and rhythm, no murmurs, rubs or gallops, PMI not laterally displaced GI- soft, non-tender, non-distended, bowel sounds present, no hepatosplenomegaly Extremities- no clubbing, cyanosis, or edema; DP/PT/radial pulses 2+ bilaterally MS- no significant deformity or atrophy Skin-  warm and dry, no rash or lesion; ICD pocket well healed Psych- euthymic mood, full affect Neuro- strength and sensation are intact  ICD interrogation- reviewed in detail today,  See PACEART report  EKG:  EKG is ordered today. The ekg ordered today shows ***  Recent Labs: 08/26/2017: ALT 7; Hemoglobin 11.7 09/16/2017: BUN 18; Creatinine, Ser 1.66; Potassium 5.4; Sodium 140   Wt Readings from Last 3 Encounters:  09/14/17 298 lb (135.2  kg)  08/26/17 291 lb (132 kg)  08/16/17 298 lb (135.2 kg)     Other studies Reviewed: Additional studies/ records that were reviewed today include: Dr Olin Pia office notes   Assessment and Plan:  1.  Chronic systolic dysfunction euvolemic today Stable on an appropriate medical regimen Normal ICD function See Pace Art report No changes today  2.  HTN Stable No change required today  3.  Obesity There is no height or weight on file to calculate BMI. Weight loss encouraged   Current medicines are reviewed at length with the patient today.   The patient does not have concerns regarding his medicines.  The following changes were made today:  none  Labs/ tests ordered today include: none No orders of the defined types were placed in this encounter.    Disposition:   Follow up with [facility], Dr Caryl Comes 1 year      Signed, Chanetta Marshall, NP 11/05/2017 7:33 AM  [place] [address] [place] [address] [phone] (office) [phone] (fax)

## 2017-11-12 ENCOUNTER — Telehealth: Payer: Self-pay | Admitting: *Deleted

## 2017-11-12 NOTE — Telephone Encounter (Signed)
Patient's wife calling to request refill for hydrocodone.  States he will be out of med on Sunday and pharmacy/[facility] will be closed then.  Doesn't want patient to be without his med this weekend.  Will route request to Dr. Nori Riis.  Burna Forts, BSN, RN-BC

## 2017-11-12 NOTE — Telephone Encounter (Signed)
pts wife would like for his hyrdocodone to be called in. Dr Nori Riis said to always call early before it runs out.  [place]. Please advise

## 2017-11-13 MED ORDER — HYDROCODONE-ACETAMINOPHEN 5-325 MG PO TABS: ORAL_TABLET | ORAL | 0 refills | Status: DC

## 2017-11-18 ENCOUNTER — Encounter: Payer: Self-pay | Admitting: Family Medicine

## 2017-11-18 ENCOUNTER — Ambulatory Visit (INDEPENDENT_AMBULATORY_CARE_PROVIDER_SITE_OTHER): Payer: Medicare Other | Admitting: Family Medicine

## 2017-11-18 ENCOUNTER — Other Ambulatory Visit: Payer: Self-pay

## 2017-11-18 ENCOUNTER — Other Ambulatory Visit: Payer: Self-pay | Admitting: Family Medicine

## 2017-11-18 VITALS — BP 96/62 | HR 53 | Temp 98.2°F | Ht 67.0 in | Wt 293.8 lb

## 2017-11-18 DIAGNOSIS — E1165 Type 2 diabetes mellitus with hyperglycemia: Secondary | ICD-10-CM | POA: Diagnosis not present

## 2017-11-18 DIAGNOSIS — N183 Chronic kidney disease, stage 3 unspecified: Secondary | ICD-10-CM

## 2017-11-18 DIAGNOSIS — M6530 Trigger finger, unspecified finger: Secondary | ICD-10-CM

## 2017-11-18 DIAGNOSIS — I10 Essential (primary) hypertension: Secondary | ICD-10-CM | POA: Diagnosis not present

## 2017-11-18 DIAGNOSIS — IMO0002 Reserved for concepts with insufficient information to code with codable children: Secondary | ICD-10-CM

## 2017-11-18 DIAGNOSIS — E118 Type 2 diabetes mellitus with unspecified complications: Secondary | ICD-10-CM

## 2017-11-18 LAB — POCT GLYCOSYLATED HEMOGLOBIN (HGB A1C): HbA1c, POC (controlled diabetic range): 6.3 % (ref 0.0–7.0)

## 2017-11-18 NOTE — Patient Instructions (Signed)
Let me see you in about 3 months

## 2017-11-19 LAB — BASIC METABOLIC PANEL
BUN/Creatinine Ratio: 10 (ref 9–20)
BUN: 16 mg/dL (ref 6–24)
CO2: 18 mmol/L — ABNORMAL LOW (ref 20–29)
Calcium: 8.7 mg/dL (ref 8.7–10.2)
Chloride: 114 mmol/L — ABNORMAL HIGH (ref 96–106)
Creatinine, Ser: 1.55 mg/dL — ABNORMAL HIGH (ref 0.76–1.27)
GFR calc Af Amer: 56 mL/min/{1.73_m2} — ABNORMAL LOW (ref >59)
GFR calc non Af Amer: 48 mL/min/{1.73_m2} — ABNORMAL LOW (ref >59)
Glucose: 140 mg/dL — ABNORMAL HIGH (ref 65–99)
Potassium: 5 mmol/L (ref 3.5–5.2)
Sodium: 144 mmol/L (ref 134–144)

## 2017-11-20 DIAGNOSIS — M6530 Trigger finger, unspecified finger: Secondary | ICD-10-CM | POA: Insufficient documentation

## 2017-11-20 NOTE — Assessment & Plan Note (Signed)
We will continue to come following his kidney function fairly regularly.  No medication changes.

## 2017-11-20 NOTE — Assessment & Plan Note (Signed)
Despite his lack of following a good diabetic regimen, he has excellent control.

## 2017-11-20 NOTE — Progress Notes (Signed)
    CHIEF COMPLAINT / HPI: #1.  Has continued nodular areas in his right hand.  In the palmar area.  Sometimes hurt.  Occasionally Those fingerstick. #2.  Taking his medications regularly except for his diuretic.  He admits he takes it once or twice a month.  Feels like he just does not need it. 3.  Diabetes mellitus: He is not watching his blood sugars.  Appetite's fine.  No diarrhea, no abdominal pain. 4.  Heart disease: Taking those medicines with daily.  No unusual chest pain on exertion.  Does not have any lower extremity edema.  REVIEW OF SYSTEMS: See HPI  PERTINENT  PMH / PSH: I have reviewed the patient's medications, allergies, past medical and surgical history, smoking status and updated in the EMR as appropriate.   OBJECTIVE:  Vital signs reviewed. GENERAL: Well-developed, well-nourished, no acute distress.  Obese CARDIOVASCULAR: Regular rate and rhythm no murmur gallop or rub LUNGS: Clear to auscultation bilaterally, no rales or wheeze. ABDOMEN: Soft positive bowel sounds.  No rebound or guarding. NEURO: No gross focal neurological deficits except for bilateral blindness.  No nystagmus.   MSK: Movement of extremity x 4.  He is walking with a cane now secondary to his vision loss.  Pain in his left knee at the medial joint line.  Pain with full range of extension on the left knee.  Right knee has full range of motion in all planes.  No edema is noted in lower extremity. HAND: Palmar area of his right hand he has 2 nodules over the flexor tendons.  These are mildly tender to palpation.  I noted no triggering with finger flexion of the index second or ring finger.      ASSESSMENT / PLAN:  No problem-specific Assessment & Plan notes found for this encounter.

## 2017-11-20 NOTE — Assessment & Plan Note (Signed)
Blood pressure has good control.  Current regimen.

## 2017-12-01 ENCOUNTER — Encounter: Payer: Self-pay | Admitting: Family Medicine

## 2017-12-08 ENCOUNTER — Ambulatory Visit (INDEPENDENT_AMBULATORY_CARE_PROVIDER_SITE_OTHER): Payer: Medicare Other | Admitting: *Deleted

## 2017-12-08 ENCOUNTER — Ambulatory Visit (INDEPENDENT_AMBULATORY_CARE_PROVIDER_SITE_OTHER): Payer: Medicare Other

## 2017-12-08 ENCOUNTER — Telehealth: Payer: Self-pay

## 2017-12-08 DIAGNOSIS — I428 Other cardiomyopathies: Secondary | ICD-10-CM | POA: Diagnosis not present

## 2017-12-08 DIAGNOSIS — Z95810 Presence of automatic (implantable) cardiac defibrillator: Secondary | ICD-10-CM

## 2017-12-08 DIAGNOSIS — I5022 Chronic systolic (congestive) heart failure: Secondary | ICD-10-CM

## 2017-12-08 NOTE — Progress Notes (Signed)
EPIC Encounter for ICM Monitoring  Patient Name: [name] is a 57 y.o. male Date: 12/08/2017 Primary Care Physican: Dickie La, MD Primary [facility] Electrophysiologist: Caryl Comes Dry Weight:291lbs at5/22/19office visit(no scales at home)      Attempted call to patient and unable to reach.    Transmission reviewed.    Thoracic impedance normal.  Prescribed dosage: Furosemide 40 mg 1 tablet as needed.   Labs: 08/26/2017 Creatinine 1.91, BUN 32, Potassium 5.6, Sodium 141, EGFR 38-44 - Repeat Labs will be done 09/08/2017 per PCP. 03/11/2017 Creatinine 1.75, BUN 24, Potassium 5.2, Sodium 111, EGFR 42-49 10/01/2016 Creatinine 1.72, BUN 21, Potassium 4.8, Sodium 141, EGFR 43-50 05/21/2016 Creatinine 1.44, BUN 18, Potassium 4.6, Sodium 143 11/21/2015 Creatinine 1.53, BUN 16, Potassium 4.5, Sodium 141  11/07/2015 Creatinine 1.64, BUN 20, Potassium 4.6, Sodium 142  08/29/2015 Creatinine 1.19, BUN 12, Potassium 4.3, Sodium 139  05/24/2015 Creatinine 1.40, BUN 18, Potassium 4.2, Sodium 139  04/09/2015 Creatinine 1.40, BUN 18, Potassium 5.0, Sodium 142  Recommendations: NONE - Unable to reach.  Follow-up plan: ICM clinic phone appointment on 02/15/2018.   Office appointment scheduled 01/15/2018 with Chanetta Marshall, NP.    Copy of ICM check sent to Dr. Caryl Comes.   3 month ICM trend: 12/08/2017    1 Year ICM trend:       Rosalene Billings, RN 12/08/2017 3:08 PM

## 2017-12-08 NOTE — Progress Notes (Signed)
Remote ICD transmission.   

## 2017-12-08 NOTE — Telephone Encounter (Signed)
Remote ICM transmission received.  Attempted call to patient and recording stated person is not able to receive calls at this time.

## 2017-12-09 ENCOUNTER — Other Ambulatory Visit: Payer: Self-pay | Admitting: Family Medicine

## 2017-12-09 DIAGNOSIS — G8929 Other chronic pain: Secondary | ICD-10-CM

## 2017-12-09 DIAGNOSIS — M25569 Pain in unspecified knee: Principal | ICD-10-CM

## 2017-12-09 NOTE — Telephone Encounter (Signed)
Pt needs refill on his knee pain medication. Please call pt once this has been done.

## 2017-12-10 MED ORDER — HYDROCODONE-ACETAMINOPHEN 5-325 MG PO TABS: ORAL_TABLET | ORAL | 0 refills | Status: DC

## 2017-12-10 NOTE — Progress Notes (Signed)
Wife and patient returned call.  He is doing well and denied any fluid symptoms.  New phone number listed.  Encouraged to call for fluid symptoms and transmission reviewed.  No changes today.  Next ICM remote transmission 02/15/2018.

## 2017-12-10 NOTE — Telephone Encounter (Signed)
Reviewed notes, PMP, all appropriate. Refilled Rx.

## 2017-12-15 ENCOUNTER — Encounter: Payer: Medicare Other | Admitting: Internal Medicine

## 2017-12-28 DIAGNOSIS — E785 Hyperlipidemia, unspecified: Secondary | ICD-10-CM | POA: Diagnosis not present

## 2017-12-28 DIAGNOSIS — I420 Dilated cardiomyopathy: Secondary | ICD-10-CM | POA: Diagnosis not present

## 2017-12-28 DIAGNOSIS — I5020 Unspecified systolic (congestive) heart failure: Secondary | ICD-10-CM | POA: Diagnosis not present

## 2017-12-28 DIAGNOSIS — G4733 Obstructive sleep apnea (adult) (pediatric): Secondary | ICD-10-CM | POA: Diagnosis not present

## 2017-12-28 DIAGNOSIS — I10 Essential (primary) hypertension: Secondary | ICD-10-CM | POA: Diagnosis not present

## 2017-12-28 DIAGNOSIS — E119 Type 2 diabetes mellitus without complications: Secondary | ICD-10-CM | POA: Diagnosis not present

## 2017-12-30 LAB — CUP PACEART REMOTE DEVICE CHECK
Battery Remaining Longevity: 117 mo
Battery Voltage: 2.97 V
Brady Statistic RV Percent Paced: 0.01 %
Date Time Interrogation Session: 20190903052404
HighPow Impedance: 43 Ohm
HighPow Impedance: 52 Ohm
Implantable Lead Implant Date: 20081114
Implantable Lead Location: 753860
Implantable Lead Model: 6947
Implantable Pulse Generator Implant Date: 20170222
Lead Channel Impedance Value: 380 Ohm
Lead Channel Impedance Value: 399 Ohm
Lead Channel Pacing Threshold Amplitude: 0.5 V
Lead Channel Pacing Threshold Pulse Width: 0.4 ms
Lead Channel Sensing Intrinsic Amplitude: 5.75 mV
Lead Channel Sensing Intrinsic Amplitude: 5.75 mV
Lead Channel Setting Pacing Amplitude: 2.5 V
Lead Channel Setting Pacing Pulse Width: 0.4 ms
Lead Channel Setting Sensing Sensitivity: 0.3 mV

## 2018-01-07 ENCOUNTER — Telehealth: Payer: Self-pay | Admitting: Family Medicine

## 2018-01-07 DIAGNOSIS — G8929 Other chronic pain: Secondary | ICD-10-CM

## 2018-01-07 DIAGNOSIS — M25569 Pain in unspecified knee: Principal | ICD-10-CM

## 2018-01-07 MED ORDER — HYDROCODONE-ACETAMINOPHEN 5-325 MG PO TABS: ORAL_TABLET | ORAL | 0 refills | Status: DC

## 2018-01-07 NOTE — Telephone Encounter (Signed)
Patient needs Hydrocodone refilled, it will be out on the 6th.  Please let patient know when ready, at [phone]

## 2018-01-14 NOTE — Progress Notes (Signed)
Electrophysiology Office Note Date: 01/15/2018  ID:  [name], DOB 1961-04-03, MRN [id number]  PCP: Adam La, MD Electrophysiologist: [name]  CC: Routine ICD follow-up  [name] is a 57 y.o. male seen today for Dr [name].  He presents today for routine electrophysiology followup.  Since last being seen in our clinic, the patient reports doing relatively well.  He is followed closely by Dr [name].  He denies chest pain, palpitations, dyspnea, PND, orthopnea, nausea, vomiting, dizziness, syncope, edema, weight gain, or early satiety.  He has not had ICD shocks.   Device History: MDT single chamber ICD implanted 2008 for NICM; gen change 2017 History of appropriate therapy: No History of AAD therapy: No   Past Medical History:  Diagnosis Date  . Arthritis   . Cataracts, bilateral    right  . Congestive heart failure ([place])    a. s/p MDT single chamber ICD   . Diabetes mellitus    does not check blood sugars at home  . Diverticulosis   . Glaucoma    bilateral  . H/O TIA (transient ischemic attack) and stroke   . Hyperlipidemia   . Hypertension   . Obesity    Past Surgical History:  Procedure Laterality Date  . CARDIAC CATHETERIZATION  10/24/2003   EF of 45-50%  . CARDIAC DEFIBRILLATOR PLACEMENT  2008  . CATARACT EXTRACTION  2012   right  . EP IMPLANTABLE DEVICE N/A 05/30/2015   Procedure: ICD Generator Changeout;  Surgeon: Adam Sprang, MD;  Location: [facility];  Service: Cardiovascular;  Laterality: N/A;  . KNEE SURGERY Right 2000  . SHOULDER SURGERY  2012   left    Current Outpatient Medications  Medication Sig Dispense Refill  . acetaZOLAMIDE (DIAMOX) 500 MG capsule BID per opthalmology    . amLODipine (NORVASC) 10 MG tablet Take 0.5 tablets (5 mg total) by mouth daily.    Marland Kitchen aspirin 81 MG tablet Take 81 mg by mouth daily.    . carvedilol (COREG) 25 MG tablet Take 1 tablet (25 mg total) by mouth 2 (two) times daily with a meal.      . colchicine (COLCRYS) 0.6 MG tablet TAKE ONE TABLET BY MOUTH EVERY DAY FOR FOR PAIN in FEET AS NEEDED 30 tablet 1  . EPINEPHRINE 0.3 mg/0.3 mL IJ SOAJ injection INJECT 0.3MLS INTO THE MUSCLE AS NEEDED 1 Device 1  . furosemide (LASIX) 40 MG tablet Take 40 mg by mouth as directed.    Marland Kitchen glipiZIDE (GLUCOTROL) 5 MG tablet Take 1 TABLET BY MOUTH TWICE DAILY before meal(s) 180 tablet 3  . HYDROcodone-acetaminophen (NORCO/VICODIN) 5-325 MG tablet Take by mouth 2 in AM 1 at midday and 2 at bedtime 150 tablet 0  . loratadine (CLARITIN) 10 MG tablet Take 1 tablet (10 mg total) by mouth daily as needed for allergies. 30 tablet 11  . metFORMIN (GLUCOPHAGE) 1000 MG tablet Take 1 tablet (1,000 mg total) by mouth 2 (two) times daily with a meal. 180 tablet 6  . pravastatin (PRAVACHOL) 80 MG tablet Take 80 mg by mouth at bedtime.     . sacubitril-valsartan (ENTRESTO) 49-51 MG Take 1 tablet by mouth 2 (two) times daily.    Marland Kitchen spironolactone (ALDACTONE) 25 MG tablet Take 25 mg by mouth 2 (two) times daily.      No current facility-administered medications for this visit.     Allergies:   Bee pollen and Nsaids   Social History: Social History  Socioeconomic History  . Marital status: Legally Separated    Spouse name: Not on file  . Number of children: Not on file  . Years of education: Not on file  . Highest education level: Not on file  Occupational History  . Not on file  Social Needs  . Financial resource strain: Not on file  . Food insecurity:    Worry: Not on file    Inability: Not on file  . Transportation needs:    Medical: Not on file    Non-medical: Not on file  Tobacco Use  . Smoking status: Never Smoker  . Smokeless tobacco: Never Used  Substance and Sexual Activity  . Alcohol use: No  . Drug use: No  . Sexual activity: Not on file  Lifestyle  . Physical activity:    Days per week: Not on file    Minutes per session: Not on file  . Stress: Not on file  Relationships  .  Social connections:    Talks on phone: Not on file    Gets together: Not on file    Attends religious service: Not on file    Active member of club or organization: Not on file    Attends meetings of clubs or organizations: Not on file    Relationship status: Not on file  . Intimate partner violence:    Fear of current or ex partner: Not on file    Emotionally abused: Not on file    Physically abused: Not on file    Forced sexual activity: Not on file  Other Topics Concern  . Not on file  Social History Narrative  . Not on file    Family History: Family History  Problem Relation Age of Onset  . Colon cancer Father   . Esophageal cancer Neg Hx   . Rectal cancer Neg Hx   . Stomach cancer Neg Hx     Review of Systems: All other systems reviewed and are otherwise negative except as noted above.   Physical Exam: VS:  BP (!) 88/60   Pulse 68   Ht 5\' 7"  (1.702 m)   Wt 288 lb (130.6 kg)   BMI 45.11 kg/m  , BMI Body mass index is 45.11 kg/m.  GEN- The patient is well appearing, alert and oriented x 3 today.   HEENT: normocephalic, atraumatic; sclera clear, conjunctiva pink; hearing intact; oropharynx clear; neck supple  Lungs- Clear to ausculation bilaterally, normal work of breathing.  No wheezes, rales, rhonchi Heart- Regular rate and rhythm  GI- soft, non-tender, non-distended, bowel sounds present  Extremities- no clubbing, cyanosis, or edema  MS- no significant deformity or atrophy Skin- warm and dry, no rash or lesion; ICD pocket well healed Psych- euthymic mood, full affect Neuro- strength and sensation are intact  ICD interrogation- reviewed in detail today,  See PACEART report  EKG:  EKG is ordered today. The ekg ordered today shows sinus rhythm, rate 68  Recent Labs: 08/26/2017: ALT 7; Hemoglobin 11.7 11/18/2017: BUN 16; Creatinine, Ser 1.55; Potassium 5.0; Sodium 144   Wt Readings from Last 3 Encounters:  01/15/18 288 lb (130.6 kg)  11/18/17 293 lb 12.8  oz (133.3 kg)  09/14/17 298 lb (135.2 kg)     Other studies Reviewed: Additional studies/ records that were reviewed today include: Dr Olin Pia office notes   Assessment and Plan:  1.  Chronic systolic dysfunction euvolemic today Stable on an appropriate medical regimen Normal ICD function See Pace Art report No changes  today  2.  HTN BP on the low side today Pt feels well Dr [name] and Dr Nori Riis are adjusting meds  3.  Obesity Body mass index is 45.11 kg/m. Weight loss encouraged    Current medicines are reviewed at length with the patient today.   The patient does not have concerns regarding his medicines.  The following changes were made today:  none  Labs/ tests ordered today include: none Orders Placed This Encounter  Procedures  . CUP PACEART [place]  . EKG 12-Lead     Disposition:   Follow up with [facility], ICM clinic, Dr [name] 1 year   Signed, Chanetta Marshall, NP 01/15/2018 9:55 AM  [facility] [address] [place]  [place] [address] [phone] (office) [phone] (fax)

## 2018-01-15 ENCOUNTER — Ambulatory Visit (INDEPENDENT_AMBULATORY_CARE_PROVIDER_SITE_OTHER): Payer: Medicare Other | Admitting: Nurse Practitioner

## 2018-01-15 VITALS — BP 88/60 | HR 68 | Ht 67.0 in | Wt 288.0 lb

## 2018-01-15 DIAGNOSIS — I428 Other cardiomyopathies: Secondary | ICD-10-CM | POA: Diagnosis not present

## 2018-01-15 DIAGNOSIS — I10 Essential (primary) hypertension: Secondary | ICD-10-CM

## 2018-01-15 DIAGNOSIS — I5022 Chronic systolic (congestive) heart failure: Secondary | ICD-10-CM

## 2018-01-15 LAB — CUP PACEART INCLINIC DEVICE CHECK
Date Time Interrogation Session: 20191011084426
Implantable Lead Implant Date: 20081114
Implantable Lead Location: 753860
Implantable Lead Model: 6947
Implantable Pulse Generator Implant Date: 20170222

## 2018-01-15 NOTE — Patient Instructions (Addendum)
Medication Instructions:  Your physician recommends that you continue on your current medications as directed. Please refer to the Current Medication list given to you today.  -- If you need a refill on your cardiac medications before your next appointment, please call your pharmacy. --  Labwork: None ordered  Testing/Procedures: None ordered  Follow-Up: Remote monitoring is used to monitor your ICD from home. This monitoring reduces the number of office visits required to check your device to one time per year. It allows [place] to keep an eye on the functioning of your device to ensure it is working properly. You are scheduled for a device check from home on 02/15/18 and 03/09/18 ([facility]). You may send your transmission at any time that day. If you have a wireless device, the transmission will be sent automatically. After your physician reviews your transmission, you will receive a postcard with your next transmission date.   Your physician wants you to follow-up in: 1 year with  Dr Gari Crown will receive a reminder letter in the mail two months in advance. If you don't receive a letter, please call our office to schedule the follow-up appointment.  Thank you for choosing [facility]!!    Any Other Special Instructions Will Be Listed Below (If Applicable).

## 2018-02-02 ENCOUNTER — Telehealth: Payer: Self-pay | Admitting: Family Medicine

## 2018-02-02 NOTE — Telephone Encounter (Signed)
Spoke with pt's wife reminding her that after her appt, her husband is also scheduled. She confirmed he would be able to make it as well. -[place]

## 2018-02-03 ENCOUNTER — Encounter: Payer: Self-pay | Admitting: Family Medicine

## 2018-02-03 ENCOUNTER — Other Ambulatory Visit: Payer: Self-pay

## 2018-02-03 ENCOUNTER — Ambulatory Visit (INDEPENDENT_AMBULATORY_CARE_PROVIDER_SITE_OTHER): Payer: Medicare Other | Admitting: Family Medicine

## 2018-02-03 ENCOUNTER — Other Ambulatory Visit: Payer: Self-pay | Admitting: Family Medicine

## 2018-02-03 VITALS — BP 100/68 | HR 62 | Temp 98.4°F | Ht 67.0 in | Wt 294.4 lb

## 2018-02-03 DIAGNOSIS — H5410 Blindness, one eye, low vision other eye, unspecified eyes: Secondary | ICD-10-CM

## 2018-02-03 DIAGNOSIS — E118 Type 2 diabetes mellitus with unspecified complications: Secondary | ICD-10-CM | POA: Diagnosis not present

## 2018-02-03 DIAGNOSIS — M25569 Pain in unspecified knee: Secondary | ICD-10-CM | POA: Diagnosis not present

## 2018-02-03 DIAGNOSIS — IMO0002 Reserved for concepts with insufficient information to code with codable children: Secondary | ICD-10-CM

## 2018-02-03 DIAGNOSIS — Z23 Encounter for immunization: Secondary | ICD-10-CM

## 2018-02-03 DIAGNOSIS — G8929 Other chronic pain: Secondary | ICD-10-CM

## 2018-02-03 DIAGNOSIS — I428 Other cardiomyopathies: Secondary | ICD-10-CM

## 2018-02-03 DIAGNOSIS — E1165 Type 2 diabetes mellitus with hyperglycemia: Secondary | ICD-10-CM

## 2018-02-03 MED ORDER — HYDROCODONE-ACETAMINOPHEN 5-325 MG PO TABS: ORAL_TABLET | ORAL | 0 refills | Status: DC

## 2018-02-04 ENCOUNTER — Encounter: Payer: Self-pay | Admitting: Family Medicine

## 2018-02-04 NOTE — Assessment & Plan Note (Signed)
Continue current medications.  Will check A1c at next office visit.  Not sure he is going to be able to do much exercise given his continued progressive vision loss.

## 2018-02-04 NOTE — Assessment & Plan Note (Signed)
He has very little vision left in his remaining.  They are continuing his Acetazolamide

## 2018-02-04 NOTE — Progress Notes (Signed)
    CHIEF COMPLAINT / HPI: #1.  Diabetes mellitus: No episodes of low blood sugar.  Taking his medicine regularly.  Not exercising.  Not checking his blood sugars.  No increased frequency of urination. #2.  Cardiovascular disease: His cardiologist discontinued his isosorbide as his blood pressure was running fairly low.  He has not noted any change since discontinuing that medicine.  Not having any chest pain.  Exercise tolerance is baseline. 3.  Chronic knee pain: Seems a little worse since winter weather has sodium.  Needs refill on his pain medicines.  He is using them appropriately. 4.  Preventive health: He agrees to get the flu shot today.  REVIEW OF SYSTEMS: No unusual weight change, no fever.  No dyspnea on exertion, no PND.  Eyesight continues to decline.  He can basically only see mild difference between light and dark.  PERTINENT  PMH / PSH: I have reviewed the patient's medications, allergies, past medical and surgical history, smoking status and updated in the EMR as appropriate.   OBJECTIVE:  Vital signs reviewed. GENERAL: Well-developed, well-nourished, no acute distress.  Blind and using a white cane and assistance from his wife to navigate through the clinic CARDIOVASCULAR: Regular rate and rhythm no murmur gallop or rub LUNGS: Clear to auscultation bilaterally, no rales or wheeze. ABDOMEN: Soft positive bowel sounds.  Obese NEURO: Blind MSK: Movement of extremity x 4.  Normal muscle bulk and tone. Right knee no effusion.  Medial joint line tenderness.  Full range of flexion extension.    ASSESSMENT / PLAN:  Type II diabetes mellitus with complication, uncontrolled Continue current medications.  Will check A1c at next office visit.  Not sure he is going to be able to do much exercise given his continued progressive vision loss.  NICM (nonischemic cardiomyopathy) ([place]) Symptomatically he is stable.  They did discontinue his isosorbide so we discussed the need for him  to be on guard should he start developing chest pain.  Encounter for chronic pain management Refilled his medication.  One eye blind, one eye low vision He has very little vision left in his remaining.  They are continuing his Acetazolamide

## 2018-02-04 NOTE — Assessment & Plan Note (Signed)
Symptomatically he is stable.  They did discontinue his isosorbide so we discussed the need for him to be on guard should he start developing chest pain.

## 2018-02-04 NOTE — Assessment & Plan Note (Signed)
Refilled his medication.

## 2018-02-15 ENCOUNTER — Telehealth: Payer: Self-pay

## 2018-02-15 ENCOUNTER — Ambulatory Visit (INDEPENDENT_AMBULATORY_CARE_PROVIDER_SITE_OTHER): Payer: Medicare Other

## 2018-02-15 DIAGNOSIS — I5022 Chronic systolic (congestive) heart failure: Secondary | ICD-10-CM

## 2018-02-15 DIAGNOSIS — Z95810 Presence of automatic (implantable) cardiac defibrillator: Secondary | ICD-10-CM | POA: Diagnosis not present

## 2018-02-15 NOTE — Telephone Encounter (Signed)
Remote ICM transmission received.  Attempted call to patient regarding ICM remote transmission and wife stated she was not at home and has the phone with her.

## 2018-02-15 NOTE — Progress Notes (Signed)
EPIC Encounter for ICM Monitoring  Patient Name: [name] is a 57 y.o. male Date: 02/15/2018 Primary Care Physican: Dickie La, MD Primary [place] Electrophysiologist: Caryl Comes Last Weight:288lbs at10/11/19office visit(no scales at home)           Attempted call to patient and unable to reach.  Wife answered and stated she has the phone and he is not with her.  Transmission reviewed.    Thoracic impedance abnormal suggesting fluid accumulation starting 01/08/2018.   Prescribed: Furosemide 40 mg 1 tablet as directed (takes PRN)   Labs: 08/26/2017 Creatinine 1.91, BUN 32, Potassium 5.6, Sodium 141, EGFR 38-44 - Repeat Labs will be done 09/08/2017 per PCP. 03/11/2017 Creatinine 1.75, BUN 24, Potassium 5.2, Sodium 111, EGFR 42-49 10/01/2016 Creatinine 1.72, BUN 21, Potassium 4.8, Sodium 141, EGFR 43-50 05/21/2016 Creatinine 1.44, BUN 18, Potassium 4.6, Sodium 143 11/21/2015 Creatinine 1.53, BUN 16, Potassium 4.5, Sodium 141  11/07/2015 Creatinine 1.64, BUN 20, Potassium 4.6, Sodium 142  08/29/2015 Creatinine 1.19, BUN 12, Potassium 4.3, Sodium 139  05/24/2015 Creatinine 1.40, BUN 18, Potassium 4.2, Sodium 139  04/09/2015 Creatinine 1.40, BUN 18, Potassium 5.0, Sodium 142  Recommendations: Unable to reach.  Follow-up plan: ICM clinic phone appointment on 02/25/2018 to recheck fluid levels.     Copy of ICM check sent to Dr. Caryl Comes.   3 month ICM trend: 02/15/2018    1 Year ICM trend:       Rosalene Billings, RN 02/15/2018 9:43 AM

## 2018-02-16 NOTE — Progress Notes (Signed)
Spoke with patient.  Transmission reviewed.  He denied any fluid symptoms.  Advised to take PRN Furosemide x 2 days and will repeat remote transmission 02/19/2018 instead of 02/25/2018.

## 2018-02-18 ENCOUNTER — Telehealth: Payer: Self-pay | Admitting: Family Medicine

## 2018-02-18 NOTE — Telephone Encounter (Signed)
[facility] is calling and would like for Dr. Nori Riis to place a new prescription for pt's Lasix. Please call pt when this has been sent to the pharmacy.

## 2018-02-19 ENCOUNTER — Telehealth: Payer: Self-pay

## 2018-02-19 ENCOUNTER — Ambulatory Visit (INDEPENDENT_AMBULATORY_CARE_PROVIDER_SITE_OTHER): Payer: Medicare Other

## 2018-02-19 DIAGNOSIS — Z95810 Presence of automatic (implantable) cardiac defibrillator: Secondary | ICD-10-CM

## 2018-02-19 DIAGNOSIS — I5022 Chronic systolic (congestive) heart failure: Secondary | ICD-10-CM

## 2018-02-19 MED ORDER — FUROSEMIDE 40 MG PO TABS: 40.0000 mg | ORAL_TABLET | ORAL | 3 refills | Status: DC

## 2018-02-19 NOTE — Progress Notes (Signed)
EPIC Encounter for ICM Monitoring  Patient Name: [name] is a 57 y.o. male Date: 02/19/2018 Primary Care Physican: Dickie La, MD Primary [place] Electrophysiologist: Caryl Comes Last Weight:288lbs at10/11/19office visit(no scales at home)                                                          Attempted call to patient and unable to reach.    Transmission reviewed.    Thoracic impedance improved but still abnormal after taking 2 days of PRN Furosemide.   Prescribed: Furosemide 40 mg 1 tablet as directed (takes PRN)   Labs: 08/26/2017 Creatinine 1.91, BUN 32, Potassium 5.6, Sodium 141, EGFR 38-44 - Repeat Labs will be done 09/08/2017 per PCP. 03/11/2017 Creatinine 1.75, BUN 24, Potassium 5.2, Sodium 111, EGFR 42-49 10/01/2016 Creatinine 1.72, BUN 21, Potassium 4.8, Sodium 141, EGFR 43-50 05/21/2016 Creatinine 1.44, BUN 18, Potassium 4.6, Sodium 143 11/21/2015 Creatinine 1.53, BUN 16, Potassium 4.5, Sodium 141  11/07/2015 Creatinine 1.64, BUN 20, Potassium 4.6, Sodium 142  08/29/2015 Creatinine 1.19, BUN 12, Potassium 4.3, Sodium 139  05/24/2015 Creatinine 1.40, BUN 18, Potassium 4.2, Sodium 139  04/09/2015 Creatinine 1.40, BUN 18, Potassium 5.0, Sodium 142  Recommendations: Unable to reach.  Follow-up plan: ICM clinic phone appointment on 03/18/2018.     Copy of ICM check sent to Dr. Caryl Comes.    1 Year ICM trend: 02/19/2018    3 month ICM trend:       Rosalene Billings, RN 02/19/2018 6:14 PM

## 2018-02-19 NOTE — Telephone Encounter (Signed)
Remote ICM transmission received.  Attempted call to patient regarding ICM remote transmission and no answer.  

## 2018-02-26 ENCOUNTER — Other Ambulatory Visit: Payer: Self-pay

## 2018-02-26 ENCOUNTER — Telehealth: Payer: Self-pay

## 2018-02-26 MED ORDER — COLCRYS 0.6 MG PO TABS: ORAL_TABLET | ORAL | 1 refills | Status: DC

## 2018-03-02 NOTE — Telephone Encounter (Signed)
error 

## 2018-03-08 ENCOUNTER — Other Ambulatory Visit: Payer: Self-pay | Admitting: Family Medicine

## 2018-03-08 DIAGNOSIS — M25569 Pain in unspecified knee: Principal | ICD-10-CM

## 2018-03-08 DIAGNOSIS — G8929 Other chronic pain: Secondary | ICD-10-CM

## 2018-03-08 NOTE — Telephone Encounter (Signed)
Pt needs to have his hydrocodone refilled.

## 2018-03-09 MED ORDER — HYDROCODONE-ACETAMINOPHEN 5-325 MG PO TABS: ORAL_TABLET | ORAL | 0 refills | Status: DC

## 2018-03-09 NOTE — Telephone Encounter (Signed)
Pt wife is calling to check on the status of pt hydrocodone being refilled. She said she was told to call them in early.

## 2018-03-10 NOTE — Telephone Encounter (Signed)
Called pt to inform him that refill had been sent and his wife said that he had already picked it up. Katharina Caper, Greg Eckrich D, [place]

## 2018-03-18 ENCOUNTER — Telehealth: Payer: Self-pay

## 2018-03-18 ENCOUNTER — Ambulatory Visit (INDEPENDENT_AMBULATORY_CARE_PROVIDER_SITE_OTHER): Payer: Medicare Other

## 2018-03-18 DIAGNOSIS — I428 Other cardiomyopathies: Secondary | ICD-10-CM | POA: Diagnosis not present

## 2018-03-18 DIAGNOSIS — I5022 Chronic systolic (congestive) heart failure: Secondary | ICD-10-CM | POA: Diagnosis not present

## 2018-03-18 DIAGNOSIS — Z95810 Presence of automatic (implantable) cardiac defibrillator: Secondary | ICD-10-CM

## 2018-03-18 NOTE — Telephone Encounter (Signed)
Remote ICM transmission received.  Attempted call to patient regarding ICM remote transmission and left message to return call   

## 2018-03-18 NOTE — Progress Notes (Addendum)
Patient returned call.  He reported he is feeling more Dontel Harshberger of breath when walking in the last 2 days and took 1 tablet of PRN Furosemide today.  Advised to take 1 tablet tomorrow and Saturday.  Will recheck fluid levels 03/29/2018 and advised to call back if condition worsens.  02/04/2018 office weight 294 lbs.

## 2018-03-18 NOTE — Progress Notes (Signed)
EPIC Encounter for ICM Monitoring  Patient Name: [name] is a 57 y.o. male Date: 03/18/2018 Primary Care Physican: Dickie La, MD Primary [place] Electrophysiologist: Caryl Comes LastWeight:288lbs at10/11/19office visit(no scales at home)  Attempted call to patient and unable to reach.Transmission reviewed.   Thoracic impedance abnormalsuggesting fluid accumulation  Prescribed:Furosemide 40 mg 1 tablet asdirected(takes PRN)  Labs: 08/26/2017 Creatinine 1.91, BUN 32, Potassium 5.6, Sodium 141, EGFR 38-44 - Repeat Labs will be done 09/08/2017 per PCP. 03/11/2017 Creatinine 1.75, BUN 24, Potassium 5.2, Sodium 111, EGFR 42-49 10/01/2016 Creatinine 1.72, BUN 21, Potassium 4.8, Sodium 141, EGFR 43-50 05/21/2016 Creatinine 1.44, BUN 18, Potassium 4.6, Sodium 143 11/21/2015 Creatinine 1.53, BUN 16, Potassium 4.5, Sodium 141  11/07/2015 Creatinine 1.64, BUN 20, Potassium 4.6, Sodium 142  08/29/2015 Creatinine 1.19, BUN 12, Potassium 4.3, Sodium 139  05/24/2015 Creatinine 1.40, BUN 18, Potassium 4.2, Sodium 139  04/09/2015 Creatinine 1.40, BUN 18, Potassium 5.0, Sodium 142  Recommendations:Unable to reach.  Follow-up plan: ICM clinic phone appointment on12/23/2019 to recheck fluid levels.   Copy of ICM check sent to [facility].   3 month ICM trend: 03/18/2018    1 Year ICM trend:       Rosalene Billings, RN 03/18/2018 11:25 AM

## 2018-03-18 NOTE — Progress Notes (Signed)
Attempted return call to patient as requested by voice mail message and no answer.

## 2018-03-19 ENCOUNTER — Encounter: Payer: Self-pay | Admitting: Cardiology

## 2018-03-19 NOTE — Progress Notes (Signed)
Remote ICD transmission.   

## 2018-03-29 ENCOUNTER — Ambulatory Visit (INDEPENDENT_AMBULATORY_CARE_PROVIDER_SITE_OTHER): Payer: Medicare Other

## 2018-03-29 ENCOUNTER — Telehealth: Payer: Self-pay

## 2018-03-29 DIAGNOSIS — G4733 Obstructive sleep apnea (adult) (pediatric): Secondary | ICD-10-CM | POA: Diagnosis not present

## 2018-03-29 DIAGNOSIS — Z95810 Presence of automatic (implantable) cardiac defibrillator: Secondary | ICD-10-CM

## 2018-03-29 DIAGNOSIS — E119 Type 2 diabetes mellitus without complications: Secondary | ICD-10-CM | POA: Diagnosis not present

## 2018-03-29 DIAGNOSIS — I10 Essential (primary) hypertension: Secondary | ICD-10-CM | POA: Diagnosis not present

## 2018-03-29 DIAGNOSIS — I420 Dilated cardiomyopathy: Secondary | ICD-10-CM | POA: Diagnosis not present

## 2018-03-29 DIAGNOSIS — E785 Hyperlipidemia, unspecified: Secondary | ICD-10-CM | POA: Diagnosis not present

## 2018-03-29 DIAGNOSIS — H409 Unspecified glaucoma: Secondary | ICD-10-CM | POA: Diagnosis not present

## 2018-03-29 DIAGNOSIS — I5020 Unspecified systolic (congestive) heart failure: Secondary | ICD-10-CM | POA: Diagnosis not present

## 2018-03-29 DIAGNOSIS — I5022 Chronic systolic (congestive) heart failure: Secondary | ICD-10-CM

## 2018-03-29 NOTE — Telephone Encounter (Signed)
LMOVM reminding pt to send remote transmission.   

## 2018-03-30 ENCOUNTER — Other Ambulatory Visit: Payer: Self-pay | Admitting: Family Medicine

## 2018-04-01 ENCOUNTER — Telehealth: Payer: Self-pay

## 2018-04-01 NOTE — Telephone Encounter (Signed)
Remote ICM transmission received.  Attempted call to patient regarding ICM remote transmission and wife stated she was not home and patient was not with her.  They share a cell phone.  She said I could call back after 9 AM tomorrow morning.

## 2018-04-01 NOTE — Progress Notes (Signed)
EPIC Encounter for ICM Monitoring  Patient Name: [name] is a 57 y.o. male Date: 04/01/2018 Primary Care Physican: Neal, Sara L, MD Primary Cardiologist:Klein Electrophysiologist: Klein LastWeight:294lbs 02/04/2018 office weight (no scales at home)  Attempted call to patient and unable to reach.  Wife answered phone and stated she was not home and patient was not with her.  Transmission reviewed.  Requested to have DPR mailed to patient.   Thoracic impedancereturned to normalafter taking PRN Furosemide 12/12-12/14/209 but started reaccumulating 03/23/2018 but is trending toward baseline  Prescribed:Furosemide 40 mg 1 tablet asdirected(takes PRN)  Labs: 11/18/2017 Creatinine 1.55, BUN 16, Potassium 5.0, Sodium 144, eGFR 48-56 09/16/2017 Creatinine 1.66, BUN 18, Potassium 5.4, Sodium 140, eGFR 38-44 08/26/2017 Creatinine 1.91, BUN 32, Potassium 5.6, Sodium 141, EGFR 38-44 -  03/11/2017 Creatinine 1.75, BUN 24, Potassium 5.2, Sodium 111, EGFR 42-49 10/01/2016 Creatinine 1.72, BUN 21, Potassium 4.8, Sodium 141, EGFR 43-50 05/21/2016 Creatinine 1.44, BUN 18, Potassium 4.6, Sodium 143 11/21/2015 Creatinine 1.53, BUN 16, Potassium 4.5, Sodium 141  11/07/2015 Creatinine 1.64, BUN 20, Potassium 4.6, Sodium 142  08/29/2015 Creatinine 1.19, BUN 12, Potassium 4.3, Sodium 139  05/24/2015 Creatinine 1.40, BUN 18, Potassium 4.2, Sodium 139  04/09/2015 Creatinine 1.40, BUN 18, Potassium 5.0, Sodium 142  Recommendations: Will call back.   Follow-up plan: ICM clinic phone appointment on1/09/2018 (manual send) to recheck fluid levels.   Copy of ICM check sent to Dr.Klein.   3 month ICM trend: 03/29/2018    1 Year ICM trend:        S , RN 04/01/2018 8:39 AM   

## 2018-04-02 NOTE — Progress Notes (Signed)
Patient returned call. Transmission reviewed and explained reports shows fluid levels returned to normal after taking PRN Furosemide but started accumulating again 03/23/2018.  He thought it could be related to drinking too much fluid and after discussing does not seem that he drinks more than 64 oz daily.  Discussed typical foods he eats.  He reports he does not eat that much and thinks his diet is low salt but does eat hot dogs and deli meats. Discussed foods to avoid for low salt diet.  Low sodium diet and heart healthy diet information mailed to patient.  Also DRP form mailed from [facility] office.  Will recheck Optivol 04/12/2018.  Encouraged to call for any symptoms.  He currently denies any symptoms and reports if he does have fluid he usually experiences shortness of breath.  He had physician visit today and said no fluid on exam.

## 2018-04-08 ENCOUNTER — Other Ambulatory Visit: Payer: Self-pay

## 2018-04-08 ENCOUNTER — Telehealth: Payer: Self-pay | Admitting: Family Medicine

## 2018-04-08 DIAGNOSIS — G8929 Other chronic pain: Secondary | ICD-10-CM

## 2018-04-08 DIAGNOSIS — M25569 Pain in unspecified knee: Principal | ICD-10-CM

## 2018-04-08 MED ORDER — HYDROCODONE-ACETAMINOPHEN 5-325 MG PO TABS: ORAL_TABLET | ORAL | 0 refills | Status: DC

## 2018-04-08 NOTE — Telephone Encounter (Signed)
Pt wife called requesting refill on pt's hydrocodone. Please advise

## 2018-04-13 NOTE — Progress Notes (Signed)
No ICM remote transmission received for 04/12/2018 to recheck fluid levels and next ICM transmission scheduled for 05/06/2018.

## 2018-04-15 ENCOUNTER — Other Ambulatory Visit: Payer: Self-pay | Admitting: Family Medicine

## 2018-04-19 ENCOUNTER — Other Ambulatory Visit: Payer: Self-pay | Admitting: Family Medicine

## 2018-05-02 LAB — CUP PACEART REMOTE DEVICE CHECK
Battery Remaining Longevity: 113 mo
Battery Voltage: 3.01 V
Brady Statistic RV Percent Paced: 0.01 %
Date Time Interrogation Session: 20191212092503
HighPow Impedance: 38 Ohm
HighPow Impedance: 46 Ohm
Implantable Lead Implant Date: 20081114
Implantable Lead Location: 753860
Implantable Lead Model: 6947
Implantable Pulse Generator Implant Date: 20170222
Lead Channel Impedance Value: 380 Ohm
Lead Channel Impedance Value: 437 Ohm
Lead Channel Pacing Threshold Amplitude: 0.5 V
Lead Channel Pacing Threshold Pulse Width: 0.4 ms
Lead Channel Sensing Intrinsic Amplitude: 5.375 mV
Lead Channel Sensing Intrinsic Amplitude: 5.375 mV
Lead Channel Setting Pacing Amplitude: 2.5 V
Lead Channel Setting Pacing Pulse Width: 0.4 ms
Lead Channel Setting Sensing Sensitivity: 0.3 mV

## 2018-05-05 ENCOUNTER — Telehealth: Payer: Self-pay | Admitting: Family Medicine

## 2018-05-05 ENCOUNTER — Telehealth: Payer: Self-pay

## 2018-05-05 DIAGNOSIS — G8929 Other chronic pain: Secondary | ICD-10-CM

## 2018-05-05 DIAGNOSIS — M25569 Pain in unspecified knee: Principal | ICD-10-CM

## 2018-05-05 MED ORDER — HYDROCODONE-ACETAMINOPHEN 5-325 MG PO TABS: ORAL_TABLET | ORAL | 0 refills | Status: DC

## 2018-05-05 NOTE — Telephone Encounter (Signed)
Pt wife informed of below. Katharina Caper, April D, [place]

## 2018-05-05 NOTE — Telephone Encounter (Signed)
Patient wife called d/t patient having a cough. Wants to know what OTC cough med he can take with his meds and conditions.  Call back is [phone] or [phone]  Danley Danker, RN [facility] RN)

## 2018-05-05 NOTE — Telephone Encounter (Signed)
Pt needs refill on hydrocodone. Pt said it is due next week and he was told to call a week in advance. Please advise

## 2018-05-05 NOTE — Telephone Encounter (Signed)
He can use any of the OTC meds as his BP is well controlled THANKS! [name]

## 2018-05-10 NOTE — Progress Notes (Signed)
No ICM remote transmission received for 05/06/2018 and next ICM transmission scheduled for 05/17/2018.

## 2018-05-17 ENCOUNTER — Telehealth: Payer: Self-pay

## 2018-05-17 ENCOUNTER — Ambulatory Visit (INDEPENDENT_AMBULATORY_CARE_PROVIDER_SITE_OTHER): Payer: Medicare Other

## 2018-05-17 DIAGNOSIS — Z95810 Presence of automatic (implantable) cardiac defibrillator: Secondary | ICD-10-CM

## 2018-05-17 DIAGNOSIS — I5022 Chronic systolic (congestive) heart failure: Secondary | ICD-10-CM | POA: Diagnosis not present

## 2018-05-17 NOTE — Telephone Encounter (Signed)
Call to patient and advised I did not receive a transmission.  He said he has been sleeping but will send later this evening.

## 2018-05-17 NOTE — Telephone Encounter (Signed)
Returned patient call as requested by voice mail message.  Patient reported he has been coughing and wanted to know if he has any fluid.  Advised that his monitor is disconnected and have not received a transmission since December. He will send transmission today when he returns home.

## 2018-05-18 NOTE — Progress Notes (Signed)
EPIC Encounter for ICM Monitoring  Patient Name: [name] is a 58 y.o. male Date: 05/18/2018 Primary Care Physican: Dickie La, MD Primary [place] Electrophysiologist: Caryl Comes LastWeight:288lbs   Heart failure questions reviewed.  Patient has a cough but denies any other symptoms.  He has appt with PCP tomorrow.   Thoracic impedancenormal.  Prescribed:Furosemide 40 mg 1 tablet asdirected(He takes PRN)  Labs: 08/26/2017 Creatinine 1.91, BUN 32, Potassium 5.6, Sodium 141, EGFR 38-44 - Repeat Labs will be done 09/08/2017 per PCP. 03/11/2017 Creatinine 1.75, BUN 24, Potassium 5.2, Sodium 111, EGFR 42-49 10/01/2016 Creatinine 1.72, BUN 21, Potassium 4.8, Sodium 141, EGFR 43-50 05/21/2016 Creatinine 1.44, BUN 18, Potassium 4.6, Sodium 143 11/21/2015 Creatinine 1.53, BUN 16, Potassium 4.5, Sodium 141  11/07/2015 Creatinine 1.64, BUN 20, Potassium 4.6, Sodium 142  08/29/2015 Creatinine 1.19, BUN 12, Potassium 4.3, Sodium 139  05/24/2015 Creatinine 1.40, BUN 18, Potassium 4.2, Sodium 139  04/09/2015 Creatinine 1.40, BUN 18, Potassium 5.0, Sodium 142  3 month ICM trend: 05/17/2018    1 Year ICM trend:       Rosalene Billings, RN 05/18/2018 9:35 AM

## 2018-05-19 ENCOUNTER — Ambulatory Visit (INDEPENDENT_AMBULATORY_CARE_PROVIDER_SITE_OTHER): Payer: Medicare Other | Admitting: Family Medicine

## 2018-05-19 ENCOUNTER — Encounter: Payer: Self-pay | Admitting: Family Medicine

## 2018-05-19 ENCOUNTER — Other Ambulatory Visit: Payer: Self-pay

## 2018-05-19 VITALS — BP 90/60 | HR 73 | Temp 98.1°F | Ht 67.0 in | Wt 288.2 lb

## 2018-05-19 DIAGNOSIS — R0609 Other forms of dyspnea: Secondary | ICD-10-CM

## 2018-05-19 DIAGNOSIS — IMO0002 Reserved for concepts with insufficient information to code with codable children: Secondary | ICD-10-CM

## 2018-05-19 DIAGNOSIS — E1165 Type 2 diabetes mellitus with hyperglycemia: Secondary | ICD-10-CM | POA: Diagnosis not present

## 2018-05-19 DIAGNOSIS — N183 Chronic kidney disease, stage 3 unspecified: Secondary | ICD-10-CM

## 2018-05-19 DIAGNOSIS — E118 Type 2 diabetes mellitus with unspecified complications: Secondary | ICD-10-CM

## 2018-05-19 DIAGNOSIS — H5410 Blindness, one eye, low vision other eye, unspecified eyes: Secondary | ICD-10-CM

## 2018-05-19 DIAGNOSIS — N50819 Testicular pain, unspecified: Secondary | ICD-10-CM

## 2018-05-19 DIAGNOSIS — R0600 Dyspnea, unspecified: Secondary | ICD-10-CM

## 2018-05-19 LAB — POCT GLYCOSYLATED HEMOGLOBIN (HGB A1C): Hemoglobin A1C: 6.4 % — AB (ref 4.0–5.6)

## 2018-05-19 MED ORDER — BENZONATATE 100 MG PO CAPS: 100.0000 mg | ORAL_CAPSULE | Freq: Two times a day (BID) | ORAL | 0 refills | Status: DC | PRN

## 2018-05-19 MED ORDER — CEFTRIAXONE SODIUM 250 MG IJ SOLR
250.0000 mg | Freq: Once | INTRAMUSCULAR | Status: AC
  Administered 2018-05-19: 250 mg via INTRAMUSCULAR

## 2018-05-19 MED ORDER — LORATADINE 10 MG PO TABS: 10.0000 mg | ORAL_TABLET | Freq: Every day | ORAL | 11 refills | Status: DC

## 2018-05-19 MED ORDER — DOXYCYCLINE HYCLATE 100 MG PO CAPS: 100.0000 mg | ORAL_CAPSULE | Freq: Two times a day (BID) | ORAL | 0 refills | Status: AC

## 2018-05-19 NOTE — Patient Instructions (Signed)
I am calling in an antibiotic that I want you to take twice a day for 10 days.  I have also called in an allergy pill to take once a day and Tessalon Perls which are for cough.  You can take 1 of those twice a day if needed for cough.  I want to get some labs today and see you back in 2 weeks.  Please call or come in in the meantime if something new comes up or if any of your symptoms worsen.

## 2018-05-20 LAB — CMP14+EGFR
ALT: 8 IU/L (ref 0–44)
AST: 10 IU/L (ref 0–40)
Albumin/Globulin Ratio: 1.7 (ref 1.2–2.2)
Albumin: 4.3 g/dL (ref 3.8–4.9)
Alkaline Phosphatase: 50 IU/L (ref 39–117)
BUN/Creatinine Ratio: 15 (ref 9–20)
BUN: 26 mg/dL — ABNORMAL HIGH (ref 6–24)
Bilirubin Total: 0.3 mg/dL (ref 0.0–1.2)
CO2: 18 mmol/L — ABNORMAL LOW (ref 20–29)
Calcium: 9 mg/dL (ref 8.7–10.2)
Chloride: 108 mmol/L — ABNORMAL HIGH (ref 96–106)
Creatinine, Ser: 1.74 mg/dL — ABNORMAL HIGH (ref 0.76–1.27)
GFR calc Af Amer: 49 mL/min/{1.73_m2} — ABNORMAL LOW (ref >59)
GFR calc non Af Amer: 42 mL/min/{1.73_m2} — ABNORMAL LOW (ref >59)
Globulin, Total: 2.5 g/dL (ref 1.5–4.5)
Glucose: 112 mg/dL — ABNORMAL HIGH (ref 65–99)
Potassium: 5 mmol/L (ref 3.5–5.2)
Sodium: 140 mmol/L (ref 134–144)
Total Protein: 6.8 g/dL (ref 6.0–8.5)

## 2018-05-20 LAB — LDL CHOLESTEROL, DIRECT: LDL Direct: 99 mg/dL (ref 0–99)

## 2018-05-21 ENCOUNTER — Encounter: Payer: Self-pay | Admitting: Family Medicine

## 2018-05-21 DIAGNOSIS — N50819 Testicular pain, unspecified: Secondary | ICD-10-CM | POA: Insufficient documentation

## 2018-05-21 NOTE — Progress Notes (Signed)
    CHIEF COMPLAINT / HPI: #1.  Follow-up diabetes mellitus: Not checking his sugars.  No episodes of low blood sugar.  No problems with medications.  Diet and weight have been stable. 2.  Left scrotal pain intermittently for the last week or so.  Also had some tenderness at the base of his penis.  No penile discharge no lesions..  No new sex partners.  No fever, possible some very mild diffuse pelvic pain.  He is worried because his father had prostate cancer. 3.  Has had some cough that is nonproductive but nagging and aggravating.  This is been going on for several weeks.  He has he has baseline shortness of breath which gets a little worse when he has prolonged coughing but says his exercise tolerance is about the same although he admits he does not do much except sit at the house.  He is not been not doing any walking with his son secondary to weather constraints recently.   REVIEW OF SYSTEMS: See HPI.  Additional pertinent review of systems is positive for further vision loss, he is essentially not seeing even shadows out of either eye at this point.  He is wife continued to have marital discord.  Continues to have knee pain intermittently.  Energy level is good.  No constipation, no diarrhea.  No chest pain.  PERTINENT  PMH / PSH: I have reviewed the patient's medications, allergies, past medical and surgical history, smoking status and updated in the EMR as appropriate. Visual loss   OBJECTIVE:  Vital signs reviewed. GENERAL: Well-developed, well-nourished, no acute distress.  Overweight HEENT: Essentially no vision either eye.  Neck was without lymphadenopathy, no thyromegaly, no carotid bruits. CARDIOVASCULAR: Regular rate and rhythm no murmur gallop or rub LUNGS: Clear to auscultation bilaterally, no rales or wheeze. ABDOMEN: Soft positive bowel sounds GU: Testes are descended bilaterally.  The left testicle is mildly tender to palpation.  Uncircumcised.  Penis is examined with  foreskin retracted and there are no lesions, no penile tenderness, no discharge.  Deep  palpation at the base of the penis into the abdominal wall area where he said he feels a knot is mildly tender but no masses noted. MSK: Movement of extremity x 4.  Walks using assistance and a white cane. PSYCH: Alert and oriented x4.  Affect is interactive.  He asks and answers questions appropriately.  No psychomotor retardation or agitation is noted.  Speech is normal in fluency and content.    ASSESSMENT / PLAN:  No problem-specific Assessment & Plan notes found for this encounter.

## 2018-05-21 NOTE — Assessment & Plan Note (Signed)
Recheck creatinine

## 2018-05-21 NOTE — Assessment & Plan Note (Signed)
Unclear to me that this is really changed.  His lungs are very clear on exam.  There is no sign of fluid overload.  I suspect that his cough may be post viral but we will go ahead and treat with doxycycline on in case there is some underlying infection and have him follow-up in 10 to 14 days.  We discussed red flags including fever, increasing shortness of breath, productivity of cough.

## 2018-05-21 NOTE — Assessment & Plan Note (Signed)
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

## 2018-05-21 NOTE — Assessment & Plan Note (Signed)
A1c today and continue current meds Wish he could ghet back to exercising--many social barriers including worsening vision and marital discord

## 2018-05-21 NOTE — Assessment & Plan Note (Signed)
I saw nothing on clinical exam other than some very mild left testicular tenderness to palpation.  Will empirically treat for subacute prostatitis.  See him back in 2 weeks.  He will call or come in for any new or worsening symptoms we discussed red flags such as penile discharge, significant worsening in pain, fever.  Did not check PSA today.  Given his concerns, we can check it in the future but was afraid we might get abnormally elevated secondary to prostatitis if that is indeed what he has.  If he is not improved, would consider referral to urology.  Could also consider STI valuation.

## 2018-05-24 ENCOUNTER — Other Ambulatory Visit: Payer: Self-pay | Admitting: Family Medicine

## 2018-06-09 ENCOUNTER — Other Ambulatory Visit: Payer: Self-pay

## 2018-06-09 ENCOUNTER — Ambulatory Visit (INDEPENDENT_AMBULATORY_CARE_PROVIDER_SITE_OTHER): Payer: Medicare Other | Admitting: Family Medicine

## 2018-06-09 ENCOUNTER — Encounter: Payer: Self-pay | Admitting: Family Medicine

## 2018-06-09 VITALS — BP 94/60 | HR 61 | Temp 98.3°F | Ht 67.0 in | Wt 282.0 lb

## 2018-06-09 DIAGNOSIS — G8929 Other chronic pain: Secondary | ICD-10-CM

## 2018-06-09 DIAGNOSIS — N5082 Scrotal pain: Secondary | ICD-10-CM | POA: Diagnosis not present

## 2018-06-09 DIAGNOSIS — N50819 Testicular pain, unspecified: Secondary | ICD-10-CM

## 2018-06-09 NOTE — Assessment & Plan Note (Signed)
Refilled hydrocodone 

## 2018-06-09 NOTE — Progress Notes (Signed)
    CHIEF COMPLAINT / HPI: #1.  Follow-up testicular pain.  Significant improvement although he still has some generalized discomfort in both sides of his scrotum.  He also still has that hard area at the base of his penis that he is worried about.  Evidently father had some history of prostate cancer.  No urinary symptoms.  No blood in his urine.  No fever. 2.  Needs refill on his pain medicine for his right knee.  REVIEW OF SYSTEMS: No fever, no unusual weight change.  See HPI.  PERTINENT  PMH / PSH: I have reviewed the patient's medications, allergies, past medical and surgical history, smoking status and updated in the EMR as appropriate.   OBJECTIVE: GENERAL: Well-developed overweight male no acute distress CV: Regular rate and rhythm LUNGS: Decreased breath sounds in all lung fields secondary to body habitus but no rales are heard.  He has normal inspiratory effort.  No wheezes. ABDOMEN: Obese, soft  ASSESSMENT / PLAN:  Testicular pain He had improvement in his testicular pain with treatment for subacute prostatitis.  He has a lot of concerns about prostate cancer.  He also has sensation of a hard mass at the base of his penis.  I will send him to urology for further evaluation.  Encounter for chronic pain management Refilled hydrocodone.

## 2018-06-09 NOTE — Assessment & Plan Note (Signed)
He had improvement in his testicular pain with treatment for subacute prostatitis.  He has a lot of concerns about prostate cancer.  He also has sensation of a hard mass at the base of his penis.  I will send him to urology for further evaluation.

## 2018-06-10 ENCOUNTER — Telehealth: Payer: Self-pay | Admitting: Family Medicine

## 2018-06-10 DIAGNOSIS — G8929 Other chronic pain: Secondary | ICD-10-CM

## 2018-06-10 DIAGNOSIS — M25569 Pain in unspecified knee: Principal | ICD-10-CM

## 2018-06-10 MED ORDER — HYDROCODONE-ACETAMINOPHEN 5-325 MG PO TABS: ORAL_TABLET | ORAL | 0 refills | Status: DC

## 2018-06-10 NOTE — Telephone Encounter (Signed)
[name] called and said pt would like to have his hydrocodone refilled.

## 2018-06-17 ENCOUNTER — Ambulatory Visit (INDEPENDENT_AMBULATORY_CARE_PROVIDER_SITE_OTHER): Payer: Medicare Other | Admitting: *Deleted

## 2018-06-17 DIAGNOSIS — I428 Other cardiomyopathies: Secondary | ICD-10-CM

## 2018-06-18 LAB — CUP PACEART REMOTE DEVICE CHECK
Battery Remaining Longevity: 109 mo
Battery Voltage: 3.01 V
Brady Statistic RV Percent Paced: 0.01 %
Date Time Interrogation Session: 20200312052404
HighPow Impedance: 42 Ohm
HighPow Impedance: 51 Ohm
Implantable Lead Implant Date: 20081114
Implantable Lead Location: 753860
Implantable Lead Model: 6947
Implantable Pulse Generator Implant Date: 20170222
Lead Channel Impedance Value: 380 Ohm
Lead Channel Impedance Value: 437 Ohm
Lead Channel Pacing Threshold Amplitude: 0.5 V
Lead Channel Pacing Threshold Pulse Width: 0.4 ms
Lead Channel Sensing Intrinsic Amplitude: 5.25 mV
Lead Channel Sensing Intrinsic Amplitude: 5.25 mV
Lead Channel Setting Pacing Amplitude: 2.5 V
Lead Channel Setting Pacing Pulse Width: 0.4 ms
Lead Channel Setting Sensing Sensitivity: 0.3 mV

## 2018-06-21 ENCOUNTER — Ambulatory Visit (INDEPENDENT_AMBULATORY_CARE_PROVIDER_SITE_OTHER): Payer: Medicare Other

## 2018-06-21 DIAGNOSIS — I5022 Chronic systolic (congestive) heart failure: Secondary | ICD-10-CM

## 2018-06-21 DIAGNOSIS — Z95810 Presence of automatic (implantable) cardiac defibrillator: Secondary | ICD-10-CM | POA: Diagnosis not present

## 2018-06-22 ENCOUNTER — Telehealth: Payer: Self-pay | Admitting: Family Medicine

## 2018-06-22 NOTE — Progress Notes (Signed)
EPIC Encounter for ICM Monitoring  Patient Name: [name] is a 58 y.o. male Date: 06/22/2018 Primary Care Physican: Dickie La, MD Primary [place] Electrophysiologist: Caryl Comes LastWeight:288lbs   Heart failure questions reviewed and patient asymptomatic.     Thoracic impedancenormal.  Prescribed:Furosemide 40 mg 1 tablet asdirected(He takes PRN)  Labs: 08/26/2017 Creatinine 1.91, BUN 32, Potassium 5.6, Sodium 141, EGFR 38-44 - Repeat Labs will be done 09/08/2017 per PCP. 03/11/2017 Creatinine 1.75, BUN 24, Potassium 5.2, Sodium 111, EGFR 42-49 10/01/2016 Creatinine 1.72, BUN 21, Potassium 4.8, Sodium 141, EGFR 43-50 05/21/2016 Creatinine 1.44, BUN 18, Potassium 4.6, Sodium 143 11/21/2015 Creatinine 1.53, BUN 16, Potassium 4.5, Sodium 141  11/07/2015 Creatinine 1.64, BUN 20, Potassium 4.6, Sodium 142  08/29/2015 Creatinine 1.19, BUN 12, Potassium 4.3, Sodium 139  05/24/2015 Creatinine 1.40, BUN 18, Potassium 4.2, Sodium 139  04/09/2015 Creatinine 1.40, BUN 18, Potassium 5.0, Sodium 142  Recommendations:  He will take Furosemide 1 tablet x 2 days and then return to PRN  Follow-up plan: ICM clinic phone appointment on 06/29/2018 to recheck fluid levels.       Copy of ICM check sent to Dr. Caryl Comes.   3 month ICM trend: 06/21/2018    1 Year ICM trend:       Rosalene Billings, RN 06/22/2018 4:07 PM

## 2018-06-22 NOTE — Telephone Encounter (Signed)
Pt wife called and said the medication that was called in for his dry cough has not helped his cough. Please call her back to discuss next steps. Please advise

## 2018-06-24 ENCOUNTER — Encounter: Payer: Self-pay | Admitting: Cardiology

## 2018-06-24 NOTE — Progress Notes (Signed)
Remote ICD transmission.   

## 2018-06-25 NOTE — Telephone Encounter (Signed)
Called LVM for a return call to our office. Ottis Stain, CMA

## 2018-06-25 NOTE — Telephone Encounter (Signed)
Dear [name] team, If this did not help his cough then I think he needs to talk with his cardiologist because it may be a side effect of his cardiac medicine Entresto.  I do not want to stop that and he should not stop it without talking to his cardiologist, but he should tcall them and tell them that he is got this cough. I am afraid down having thing else that I would add for his cough. [name]

## 2018-06-28 NOTE — Telephone Encounter (Signed)
Pt informed. He will speak to Cardiology tomorrow about the medication. Ottis Stain, CMA

## 2018-06-29 ENCOUNTER — Other Ambulatory Visit: Payer: Self-pay

## 2018-06-29 ENCOUNTER — Telehealth: Payer: Self-pay

## 2018-06-29 ENCOUNTER — Ambulatory Visit (INDEPENDENT_AMBULATORY_CARE_PROVIDER_SITE_OTHER): Payer: Medicare Other

## 2018-06-29 DIAGNOSIS — E119 Type 2 diabetes mellitus without complications: Secondary | ICD-10-CM | POA: Diagnosis not present

## 2018-06-29 DIAGNOSIS — Z95810 Presence of automatic (implantable) cardiac defibrillator: Secondary | ICD-10-CM

## 2018-06-29 DIAGNOSIS — I5022 Chronic systolic (congestive) heart failure: Secondary | ICD-10-CM

## 2018-06-29 DIAGNOSIS — I420 Dilated cardiomyopathy: Secondary | ICD-10-CM | POA: Diagnosis not present

## 2018-06-29 DIAGNOSIS — G4733 Obstructive sleep apnea (adult) (pediatric): Secondary | ICD-10-CM | POA: Diagnosis not present

## 2018-06-29 DIAGNOSIS — I5020 Unspecified systolic (congestive) heart failure: Secondary | ICD-10-CM | POA: Diagnosis not present

## 2018-06-29 DIAGNOSIS — E785 Hyperlipidemia, unspecified: Secondary | ICD-10-CM | POA: Diagnosis not present

## 2018-06-29 DIAGNOSIS — I10 Essential (primary) hypertension: Secondary | ICD-10-CM | POA: Diagnosis not present

## 2018-06-29 NOTE — Telephone Encounter (Signed)
Left message for patient to remind of missed remote transmission.  

## 2018-06-30 NOTE — Progress Notes (Signed)
EPIC Encounter for ICM Monitoring  Patient Name: [name] is a 58 y.o. male Date: 06/30/2018 Primary Care Physican: Dickie La, MD Primary [place]  Electrophysiologist: Caryl Comes LastWeight:288lbs   Heart failure questions reviewed and patient asymptomatic.  Patient has eaten in the last week, [place] food, deli meat with cheese. Advised to read food labels and add the amount of salt he is taking in daily. Advised to limit to 2000 mg daily.  He was seen by [name] last week and told his lungs were clear.   Thoracic impedanceabnormal and has worsened since 06/21/2018 transmission.    Prescribed:Furosemide 40 mg 1 tablet asdirected([place] PRN).  TAKING DIFFERENTLY:  Patient reported [name] increased Furosemide 40 mg to 2 tablets (80 mg total) every morning.  Labs: 05/19/2018 Creatinine 1.74, BUN 26, Potassium 5.0, Sodium 140, GFR 42-49 08/26/2017 Creatinine 1.91, BUN 32, Potassium 5.6, Sodium 141, GFR 38-44 03/11/2017 Creatinine 1.75, BUN 24, Potassium 5.2, Sodium 111, GFR 42-49 10/01/2016 Creatinine 1.72, BUN 21, Potassium 4.8, Sodium 141, GFR 43-50 05/21/2016 Creatinine 1.44, BUN 18, Potassium 4.6, Sodium 143 11/21/2015 Creatinine 1.53, BUN 16, Potassium 4.5, Sodium 141  11/07/2015 Creatinine 1.64, BUN 20, Potassium 4.6, Sodium 142  08/29/2015 Creatinine 1.19, BUN 12, Potassium 4.3, Sodium 139  05/24/2015 Creatinine 1.40, BUN 18, Potassium 4.2, Sodium 139  04/09/2015 Creatinine 1.40, BUN 18, Potassium 5.0, Sodium 142  Recommendations:   Advised to follow Dr Zenia Resides instructions on taking Furosemide and to call his office should he develop any symptoms.  HF symptoms reviewed.  Follow-up plan: ICM clinic phone appointment on 07/07/2018 (manual send) to recheck fluid levels.       Copy of ICM check sent to Dr. Caryl Comes and [name].   3 month ICM trend: 06/30/2018    1 Year ICM trend:       Rosalene Billings, RN 06/30/2018 12:43  PM

## 2018-07-01 ENCOUNTER — Other Ambulatory Visit: Payer: Self-pay | Admitting: Family Medicine

## 2018-07-01 DIAGNOSIS — M25569 Pain in unspecified knee: Principal | ICD-10-CM

## 2018-07-01 DIAGNOSIS — G8929 Other chronic pain: Secondary | ICD-10-CM

## 2018-07-05 MED ORDER — HYDROCODONE-ACETAMINOPHEN 5-325 MG PO TABS: ORAL_TABLET | ORAL | 0 refills | Status: DC

## 2018-07-05 NOTE — Telephone Encounter (Signed)
Patients wife calling about his refill on his  HYDROcodone. Please call patients wife back.

## 2018-07-07 ENCOUNTER — Other Ambulatory Visit: Payer: Self-pay

## 2018-07-09 ENCOUNTER — Telehealth: Payer: Self-pay

## 2018-07-09 NOTE — Telephone Encounter (Signed)
Left message for patient to remind of missed remote transmission.  

## 2018-07-16 NOTE — Progress Notes (Signed)
No ICM remote transmission received for 07/07/2018 and next ICM transmission scheduled for 07/26/2018.

## 2018-07-19 ENCOUNTER — Other Ambulatory Visit: Payer: Self-pay | Admitting: Family Medicine

## 2018-07-26 ENCOUNTER — Other Ambulatory Visit: Payer: Self-pay

## 2018-07-26 ENCOUNTER — Ambulatory Visit (INDEPENDENT_AMBULATORY_CARE_PROVIDER_SITE_OTHER): Payer: Medicare Other

## 2018-07-26 DIAGNOSIS — I5022 Chronic systolic (congestive) heart failure: Secondary | ICD-10-CM

## 2018-07-26 DIAGNOSIS — Z95810 Presence of automatic (implantable) cardiac defibrillator: Secondary | ICD-10-CM | POA: Diagnosis not present

## 2018-07-28 ENCOUNTER — Telehealth: Payer: Self-pay

## 2018-07-28 NOTE — Telephone Encounter (Signed)
Remote ICM transmission received.  Attempted call to patient regarding ICM remote transmission and wife answered phone.  She said they only have 1 phone and she is not at home with patient.  Advised will call back.

## 2018-07-28 NOTE — Progress Notes (Signed)
EPIC Encounter for ICM Monitoring  Patient Name: [name] is a 58 y.o. male Date: 07/28/2018 Primary Care Physican: Adam La, MD Primary [place]  Electrophysiologist: [name] LastWeight:288lbs   Attempted 2 calls today to patient and unable to reach.  Transmission reviewed.   Thoracic impedanceabnormal suggested fluid accumulation since 07/14/2018 but trending back toward baseline.    Prescribed:Furosemide 40 mg 1 tablet asdirected.  TAKING DIFFERENTLY:  Patient previously reported [name] increased Furosemide 40 mg to 2 tablets (80 mg total) every morning.  Labs: 05/19/2018 Creatinine 1.74, BUN 26, Potassium 5.0, Sodium 140, GFR 42-49 08/26/2017 Creatinine 1.91, BUN 32, Potassium 5.6, Sodium 141, GFR 38-44  Recommendations: Unable to reach.  Follow-up plan: ICM clinic phone appointment on4/28/2020 to recheck fluid levels.   Copy of ICM check sent to [name] and [name].   3 month ICM trend: 07/26/2018    1 Year ICM trend:       Adam Billings, RN 07/28/2018 12:33 PM

## 2018-08-02 ENCOUNTER — Telehealth: Payer: Self-pay | Admitting: Family Medicine

## 2018-08-02 DIAGNOSIS — G8929 Other chronic pain: Secondary | ICD-10-CM

## 2018-08-02 DIAGNOSIS — M25569 Pain in unspecified knee: Principal | ICD-10-CM

## 2018-08-02 MED ORDER — HYDROCODONE-ACETAMINOPHEN 5-325 MG PO TABS: ORAL_TABLET | ORAL | 0 refills | Status: DC

## 2018-08-02 NOTE — Telephone Encounter (Signed)
Pt needs refill on hydrocodone - told to call a week in advance for this refill. Please call pt back when this has been done - [phone]

## 2018-08-02 NOTE — Telephone Encounter (Signed)
It has been done.

## 2018-08-03 ENCOUNTER — Ambulatory Visit (INDEPENDENT_AMBULATORY_CARE_PROVIDER_SITE_OTHER): Payer: Medicare Other

## 2018-08-03 ENCOUNTER — Other Ambulatory Visit: Payer: Self-pay

## 2018-08-03 DIAGNOSIS — I5022 Chronic systolic (congestive) heart failure: Secondary | ICD-10-CM

## 2018-08-03 DIAGNOSIS — Z95810 Presence of automatic (implantable) cardiac defibrillator: Secondary | ICD-10-CM

## 2018-08-06 NOTE — Progress Notes (Signed)
EPIC Encounter for ICM Monitoring  Patient Name: [name] is a 58 y.o. male Date: 08/06/2018 Primary Care Physican: Dickie La, MD Primary [place] Electrophysiologist: Caryl Comes LastWeight:288lbs   Transmission reviewed.   Thoracic impedancereturned since 07/26/2018 remote transmission.  Prescribed:Furosemide 40 mg 1 tablet asdirected. TAKING DIFFERENTLY: Patient previously reported [name] increased Furosemide 40 mg to 2 tablets (80 mg total) every morning.  Labs: 05/19/2018 Creatinine 1.74, BUN 26, Potassium 5.0, Sodium 140, GFR 42-49 08/26/2017 Creatinine 1.91, BUN 32, Potassium 5.6, Sodium 141, GFR 38-44  Recommendations:None  Follow-up plan: ICM clinic phone appointment on4/28/2020to recheck fluid levels.   Copy of ICM check sent to [place]  3 month ICM trend: 08/03/2018    1 Year ICM trend:       Rosalene Billings, RN 08/06/2018 3:09 PM

## 2018-08-31 ENCOUNTER — Ambulatory Visit (INDEPENDENT_AMBULATORY_CARE_PROVIDER_SITE_OTHER): Payer: Medicare Other

## 2018-08-31 DIAGNOSIS — I5022 Chronic systolic (congestive) heart failure: Secondary | ICD-10-CM

## 2018-08-31 DIAGNOSIS — Z95810 Presence of automatic (implantable) cardiac defibrillator: Secondary | ICD-10-CM | POA: Diagnosis not present

## 2018-08-31 NOTE — Progress Notes (Signed)
EPIC Encounter for ICM Monitoring  Patient Name: [name] is a 58 y.o. male Date: 08/31/2018 Primary Care Physican: Dickie La, MD Primary [place] Electrophysiologist: Caryl Comes LastWeight:288lbs (does not weight at home)   Spoke with patient.  He reported having a minimal amount of shortness of breath in the last few days but does not have any other fluid symptoms.    Patient stopped Furosemide at the end of March because he did not have a lot of urine output and had a cough.   Thoracic impedanceabnormal suggested fluid accumulation since 07/28/2018.  Prescribed:Furosemide 40 mg 1 tablet asdirected. TAKING DIFFERENTLY: Patient previously reported [name] increased Furosemide 40 mg to 2 tablets (80 mg total) every morning.  Labs: 05/19/2018 Creatinine 1.74, BUN 26, Potassium 5.0, Sodium 140, GFR 42-49 08/26/2017 Creatinine 1.91, BUN 32, Potassium 5.6, Sodium 141, GFR 38-44  Recommendations:Advised to resume the Furosemide dosage that was prescribed and advised not to stop without notifying MD.   Follow-up plan: ICM clinic phone appointment on6/05/2018(manual send) to recheck fluid levels.   Copy of ICM check sent to Dr.Kleinand [name] for review and recommendations if needed.  3 month ICM trend: 08/31/2018    1 Year ICM trend:       Rosalene Billings, RN 08/31/2018 11:31 AM

## 2018-09-02 ENCOUNTER — Telehealth: Payer: Self-pay | Admitting: Family Medicine

## 2018-09-02 DIAGNOSIS — G8929 Other chronic pain: Secondary | ICD-10-CM

## 2018-09-02 NOTE — Telephone Encounter (Signed)
[name] calling to request refill of: Pain medication  Name of Medication(s):  Hydrocodone  Last date of OV:  06/09/2018 Pharmacy:  [facility]  Will route refill request to Clinic RN.  Discussed with patient policy to call pharmacy for future refills.  Also, discussed refills may take up to 48 hours to approve or deny.  [name]

## 2018-09-06 MED ORDER — HYDROCODONE-ACETAMINOPHEN 5-325 MG PO TABS: ORAL_TABLET | ORAL | 0 refills | Status: DC

## 2018-09-07 ENCOUNTER — Ambulatory Visit (INDEPENDENT_AMBULATORY_CARE_PROVIDER_SITE_OTHER): Payer: Medicare Other

## 2018-09-07 DIAGNOSIS — I5022 Chronic systolic (congestive) heart failure: Secondary | ICD-10-CM

## 2018-09-07 DIAGNOSIS — R3915 Urgency of urination: Secondary | ICD-10-CM | POA: Diagnosis not present

## 2018-09-07 DIAGNOSIS — Z95810 Presence of automatic (implantable) cardiac defibrillator: Secondary | ICD-10-CM

## 2018-09-07 DIAGNOSIS — N401 Enlarged prostate with lower urinary tract symptoms: Secondary | ICD-10-CM | POA: Diagnosis not present

## 2018-09-07 DIAGNOSIS — I808 Phlebitis and thrombophlebitis of other sites: Secondary | ICD-10-CM | POA: Diagnosis not present

## 2018-09-08 ENCOUNTER — Telehealth: Payer: Self-pay

## 2018-09-08 NOTE — Telephone Encounter (Signed)
Left message for patient to remind of missed remote transmission.  

## 2018-09-10 ENCOUNTER — Other Ambulatory Visit: Payer: Self-pay | Admitting: Family Medicine

## 2018-09-10 NOTE — Progress Notes (Signed)
EPIC Encounter for ICM Monitoring  Patient Name: [name] is a 58 y.o. male Date: 09/10/2018 Primary Care Physican: Dickie La, MD Primary [place] Electrophysiologist: Caryl Comes LastWeight:288lbs (does not weight at home)   Spoke with patient.  He is asymptomatic for fluid symptoms. Since 08/31/2018 remote transmission, he resumed taking Furosemide as prescribed by [name] for most days but does not take on days he needs to run errands.    Patient had stopped Furosemide at the end of March but has not resumed taking it most days.   Thoracic impedanceremains abnormalsuggested fluid accumulationsince 07/28/2018.  Prescribed:Furosemide 40 mg 1 tablet asdirected. Prescribed differently: Patientreports [name] increased Furosemide 40 mg to 2 tablets (80 mg total) every morning.  Labs: 05/19/2018 Creatinine 1.74, BUN 26, Potassium 5.0, Sodium 140, GFR 42-49 08/26/2017 Creatinine 1.91, BUN 32, Potassium 5.6, Sodium 141, GFR 38-44  Recommendations:Advised to take Furosemide as prescribed daily and call if experiences any fluid symptoms.   Follow-up plan: ICM clinic phone appointment on6/9/2020to recheck fluid levels.   Copy of ICM check sent to Dr.Kleinand [name] for review and recommendations if needed.   3 month ICM trend: 09/08/2018    1 Year ICM trend:       Rosalene Billings, RN 09/10/2018 12:23 PM

## 2018-09-14 ENCOUNTER — Ambulatory Visit (INDEPENDENT_AMBULATORY_CARE_PROVIDER_SITE_OTHER): Payer: Medicare Other

## 2018-09-14 DIAGNOSIS — I5022 Chronic systolic (congestive) heart failure: Secondary | ICD-10-CM

## 2018-09-14 DIAGNOSIS — Z95810 Presence of automatic (implantable) cardiac defibrillator: Secondary | ICD-10-CM

## 2018-09-15 ENCOUNTER — Ambulatory Visit (INDEPENDENT_AMBULATORY_CARE_PROVIDER_SITE_OTHER): Payer: Medicare Other | Admitting: Family Medicine

## 2018-09-15 ENCOUNTER — Encounter: Payer: Self-pay | Admitting: Family Medicine

## 2018-09-15 ENCOUNTER — Other Ambulatory Visit: Payer: Self-pay

## 2018-09-15 VITALS — BP 104/68 | HR 70

## 2018-09-15 DIAGNOSIS — S39012A Strain of muscle, fascia and tendon of lower back, initial encounter: Secondary | ICD-10-CM | POA: Insufficient documentation

## 2018-09-15 DIAGNOSIS — I10 Essential (primary) hypertension: Secondary | ICD-10-CM

## 2018-09-15 DIAGNOSIS — E118 Type 2 diabetes mellitus with unspecified complications: Secondary | ICD-10-CM | POA: Diagnosis not present

## 2018-09-15 DIAGNOSIS — I5022 Chronic systolic (congestive) heart failure: Secondary | ICD-10-CM | POA: Diagnosis not present

## 2018-09-15 DIAGNOSIS — N183 Chronic kidney disease, stage 3 (moderate): Secondary | ICD-10-CM

## 2018-09-15 DIAGNOSIS — E1122 Type 2 diabetes mellitus with diabetic chronic kidney disease: Secondary | ICD-10-CM | POA: Diagnosis not present

## 2018-09-15 DIAGNOSIS — E1165 Type 2 diabetes mellitus with hyperglycemia: Secondary | ICD-10-CM

## 2018-09-15 DIAGNOSIS — IMO0002 Reserved for concepts with insufficient information to code with codable children: Secondary | ICD-10-CM

## 2018-09-15 LAB — POCT GLYCOSYLATED HEMOGLOBIN (HGB A1C): HbA1c, POC (controlled diabetic range): 6.3 % (ref 0.0–7.0)

## 2018-09-15 MED ORDER — CYCLOBENZAPRINE HCL 5 MG PO TABS: 5.0000 mg | ORAL_TABLET | Freq: Every day | ORAL | 1 refills | Status: DC

## 2018-09-15 NOTE — Assessment & Plan Note (Signed)
Well controled.  Perhaps over controlled - but needs the drugs for his CHF.

## 2018-09-15 NOTE — Assessment & Plan Note (Signed)
DM well controled.  Does have CKD 3.  Recheck renal function.  On ARB as part of the entresto.  No change.

## 2018-09-15 NOTE — Progress Notes (Signed)
EPIC Encounter for ICM Monitoring  Patient Name: [name] is a 58 y.o. male Date: 09/15/2018 Primary Care Physican: Dickie La, MD Primary [place] Electrophysiologist: Caryl Comes LastWeight:288lbs (does not weight at home)  Spoke with patient. He is asymptomatic for fluid symptoms.  He reports compliance with taking Furosemide 80 mg daily as prescribed by [name].    Optivol thoracic impedance significantly improved and trending closer to baseline normal.   Prescribed:Furosemide 40 mg 1 tablet asdirected. Prescribed differently: Patientreports [name] increased Furosemide 40 mg to 2 tablets (80 mg total) every morning.  Labs: 05/19/2018 Creatinine 1.74, BUN 26, Potassium 5.0, Sodium 140, GFR 42-49 08/26/2017 Creatinine 1.91, BUN 32, Potassium 5.6, Sodium 141, GFR 38-44  Recommendations:Advised to continue to take Furosemide as prescribed daily and call if experiences any fluid symptoms.  Follow-up plan: ICM clinic phone appointment on6/29/2020.   Copy of ICM check sent to [place].  3 month ICM trend: 09/14/2018    1 Year ICM trend:       Rosalene Billings, RN 09/15/2018 12:12 PM

## 2018-09-15 NOTE — Patient Instructions (Addendum)
I will call with the A1C and kidney results I think you have some arthritis in your back which was aggrevated by the fall.  I prescribed a muscle relaxer to help you at night. See Dr. Nori Riis in 3 months.  Sooner if problems.

## 2018-09-15 NOTE — Assessment & Plan Note (Signed)
Well controled on current meds 

## 2018-09-15 NOTE — Assessment & Plan Note (Signed)
REviewed xrays.  Neg lumbar series in 2013 but osteoarthritis of hips in 2019.  Suspect underlying DDD and or facet arthropathy that flaired with fall.  Knows to not take OTC NSAIDs.  Since most problems at night, will add low dose flexeril qhs.

## 2018-09-15 NOTE — Progress Notes (Signed)
Established Patient Office Visit  Subjective:  Patient ID: [name], male    DOB: 01-07-1961  Age: 58 y.o. MRN: [id number]  CC:  Chief Complaint  Patient presents with  . Back Pain    rt side    HPI [name] presents for right sided lumbar back pain.  [place] at [facility] about three months ago "before the COVID thing started.)  Struck his right low back on the cart as he fell.  He is certain that he passed out because of low blood sugar.  He also may have strained his back as several workers struggled to help him get up.  Intially, quite a bit of pain throughout the day.  Now only has pain at night when he roles over in bed. No fever, bowel, blader changes.  No SOB or change in appetite.  No additional low BS spells.  Polypharm - I corrected his med list which had several meds he no longer takes.  HFrEF on coreg, lasix and Entresto.  Denies DOE  DM, due for AIC.    HBP - recent BP readings have all been lowish.  He denies lightheadedness on standing.  Syncope episode attributed to low BS.  His BP lowering meds are all related to his HFrEF.  Past Medical History:  Diagnosis Date  . Arthritis   . Cataracts, bilateral    right  . Congestive heart failure ([place])    a. s/p MDT single chamber ICD   . Diabetes mellitus    does not check blood sugars at home  . Diverticulosis   . Glaucoma    bilateral  . H/O TIA (transient ischemic attack) and stroke   . Hyperlipidemia   . Hypertension   . Obesity     Past Surgical History:  Procedure Laterality Date  . CARDIAC CATHETERIZATION  10/24/2003   EF of 45-50%  . CARDIAC DEFIBRILLATOR PLACEMENT  2008  . CATARACT EXTRACTION  2012   right  . EP IMPLANTABLE DEVICE N/A 05/30/2015   Procedure: ICD Generator Changeout;  Surgeon: [name], MD;  Location: [facility];  Service: Cardiovascular;  Laterality: N/A;  . KNEE SURGERY Right 2000  . SHOULDER SURGERY  2012   left    Family History  Problem Relation  Age of Onset  . Colon cancer Father   . Esophageal cancer Neg Hx   . Rectal cancer Neg Hx   . Stomach cancer Neg Hx     Social History   Socioeconomic History  . Marital status: Legally Separated    Spouse name: Not on file  . Number of children: Not on file  . Years of education: Not on file  . Highest education level: Not on file  Occupational History  . Not on file  Social Needs  . Financial resource strain: Not on file  . Food insecurity:    Worry: Not on file    Inability: Not on file  . Transportation needs:    Medical: Not on file    Non-medical: Not on file  Tobacco Use  . Smoking status: Never Smoker  . Smokeless tobacco: Never Used  Substance and Sexual Activity  . Alcohol use: No  . Drug use: No  . Sexual activity: Not on file  Lifestyle  . Physical activity:    Days per week: Not on file    Minutes per session: Not on file  . Stress: Not on file  Relationships  . Social connections:  Talks on phone: Not on file    Gets together: Not on file    Attends religious service: Not on file    Active member of club or organization: Not on file    Attends meetings of clubs or organizations: Not on file    Relationship status: Not on file  . Intimate partner violence:    Fear of current or ex partner: Not on file    Emotionally abused: Not on file    Physically abused: Not on file    Forced sexual activity: Not on file  Other Topics Concern  . Not on file  Social History Narrative  . Not on file    Outpatient Medications Prior to Visit  Medication Sig Dispense Refill  . aspirin 81 MG tablet Take 81 mg by mouth daily.    . benzonatate (TESSALON) 100 MG capsule Take 1 capsule (100 mg total) by mouth 2 (two) times daily as needed for cough. 20 capsule 0  . carvedilol (COREG) 25 MG tablet Take 1 tablet (25 mg total) by mouth 2 (two) times daily with a meal.    . COLCRYS 0.6 MG tablet TAKE ONE TABLET BY MOUTH EVERY DAY FOR FOR PAIN in FEET AS NEEDED 30  tablet 1  . EPINEPHRINE 0.3 mg/0.3 mL IJ SOAJ injection INJECT 0.3MLS INTO THE MUSCLE AS NEEDED 1 Device 1  . furosemide (LASIX) 40 MG tablet Take 1 tablet (40 mg total) by mouth as directed. 90 tablet 3  . HYDROcodone-acetaminophen (NORCO/VICODIN) 5-325 MG tablet Take by mouth 2 in AM 1 at midday and 2 at bedtime for chronic knee pain do not fill before September 06 2018 150 tablet 0  . isosorbide mononitrate (ISMO,MONOKET) 20 MG tablet TAKE ONE TABLET BY MOUTH TWICE DAILY 180 tablet 3  . loratadine (CLARITIN) 10 MG tablet Take 1 tablet (10 mg total) by mouth daily. 30 tablet 11  . metFORMIN (GLUCOPHAGE) 1000 MG tablet TAKE ONE TABLET BY MOUTH TWICE DAILY WITH FOOD 180 tablet 6  . pravastatin (PRAVACHOL) 80 MG tablet Take 80 mg by mouth at bedtime.     . sacubitril-valsartan (ENTRESTO) 49-51 MG Take 1 tablet by mouth 2 (two) times daily.    Marland Kitchen spironolactone (ALDACTONE) 25 MG tablet Take 25 mg by mouth 2 (two) times daily.     Marland Kitchen acetaZOLAMIDE (DIAMOX) 500 MG capsule BID per opthalmology    . amLODipine (NORVASC) 10 MG tablet Take 0.5 tablets (5 mg total) by mouth daily.    Marland Kitchen glipiZIDE (GLUCOTROL) 5 MG tablet TAKE ONE TABLET BY MOUTH TWICE DAILY BEFORE MEALS 180 tablet 3   No facility-administered medications prior to visit.     Allergies  Allergen Reactions  . Bee Pollen Anaphylaxis, Itching and Swelling  . Nsaids     CKD and CHF    ROS Review of Systems    Objective:    Physical Exam  BP 104/68   Pulse 70   SpO2 94%  Wt Readings from Last 3 Encounters:  06/09/18 282 lb (127.9 kg)  05/19/18 288 lb 3.2 oz (130.7 kg)  02/03/18 294 lb 6.4 oz (133.5 kg)   Lungs clear Cardiac RRR without m or g Abd benign Ext no peripheral edema. Back. Pain in upper lumbar region on right below ribs.  Tender in muscle between medial and mid scapular lines.  Health Maintenance Due  Topic Date Due  . COLONOSCOPY  07/22/2016  . TETANUS/TDAP  11/16/2017    There are no preventive care reminders  to display for this patient.  Lab Results  Component Value Date   TSH 1.85 11/07/2015   Lab Results  Component Value Date   WBC 7.1 11/07/2015   HGB 11.7 (A) 08/26/2017   HCT 37.6 (L) 11/07/2015   MCV 86.6 11/07/2015   PLT 236 11/07/2015   Lab Results  Component Value Date   NA 140 05/19/2018   K 5.0 05/19/2018   CO2 18 (L) 05/19/2018   GLUCOSE 112 (H) 05/19/2018   BUN 26 (H) 05/19/2018   CREATININE 1.74 (H) 05/19/2018   BILITOT 0.3 05/19/2018   ALKPHOS 50 05/19/2018   AST 10 05/19/2018   ALT 8 05/19/2018   PROT 6.8 05/19/2018   ALBUMIN 4.3 05/19/2018   CALCIUM 9.0 05/19/2018   Lab Results  Component Value Date   CHOL 150 08/20/2011   Lab Results  Component Value Date   HDL 33 (L) 08/20/2011   Lab Results  Component Value Date   LDLCALC 91 08/20/2011   Lab Results  Component Value Date   TRIG 129 08/20/2011   Lab Results  Component Value Date   CHOLHDL 4.5 08/20/2011   Lab Results  Component Value Date   HGBA1C 6.3 09/15/2018      Assessment & Plan:   Problem List Items Addressed This Visit    Type II diabetes mellitus with complication, uncontrolled ([place]) - Primary   Relevant Orders   POCT glycosylated hemoglobin (Hb A1C) (Completed)   Lumbar strain   Relevant Medications   cyclobenzaprine (FLEXERIL) 5 MG tablet   Chronic systolic heart failure (HCC)   Relevant Orders   Basic Metabolic Panel      Meds ordered this encounter  Medications  . cyclobenzaprine (FLEXERIL) 5 MG tablet    Sig: Take 1 tablet (5 mg total) by mouth at bedtime.    Dispense:  30 tablet    Refill:  1    Follow-up: No follow-ups on file.    Zenia Resides, MD

## 2018-09-16 ENCOUNTER — Ambulatory Visit (INDEPENDENT_AMBULATORY_CARE_PROVIDER_SITE_OTHER): Payer: Medicare Other | Admitting: *Deleted

## 2018-09-16 ENCOUNTER — Ambulatory Visit: Payer: Medicare Other | Admitting: Family Medicine

## 2018-09-16 DIAGNOSIS — I428 Other cardiomyopathies: Secondary | ICD-10-CM | POA: Diagnosis not present

## 2018-09-16 LAB — CUP PACEART REMOTE DEVICE CHECK
Battery Remaining Longevity: 106 mo
Battery Voltage: 2.99 V
Brady Statistic RV Percent Paced: 0.06 %
Date Time Interrogation Session: 20200611092704
HighPow Impedance: 39 Ohm
HighPow Impedance: 47 Ohm
Implantable Lead Implant Date: 20081114
Implantable Lead Location: 753860
Implantable Lead Model: 6947
Implantable Pulse Generator Implant Date: 20170222
Lead Channel Impedance Value: 285 Ohm
Lead Channel Impedance Value: 380 Ohm
Lead Channel Pacing Threshold Amplitude: 0.625 V
Lead Channel Pacing Threshold Pulse Width: 0.4 ms
Lead Channel Sensing Intrinsic Amplitude: 5.5 mV
Lead Channel Setting Pacing Amplitude: 2.5 V
Lead Channel Setting Pacing Pulse Width: 0.4 ms
Lead Channel Setting Sensing Sensitivity: 0.3 mV

## 2018-09-16 LAB — BASIC METABOLIC PANEL
BUN/Creatinine Ratio: 16 (ref 9–20)
BUN: 23 mg/dL (ref 6–24)
CO2: 24 mmol/L (ref 20–29)
Calcium: 9 mg/dL (ref 8.7–10.2)
Chloride: 104 mmol/L (ref 96–106)
Creatinine, Ser: 1.46 mg/dL — ABNORMAL HIGH (ref 0.76–1.27)
GFR calc Af Amer: 60 mL/min/{1.73_m2} (ref >59)
GFR calc non Af Amer: 52 mL/min/{1.73_m2} — ABNORMAL LOW (ref >59)
Glucose: 111 mg/dL — ABNORMAL HIGH (ref 65–99)
Potassium: 4.9 mmol/L (ref 3.5–5.2)
Sodium: 142 mmol/L (ref 134–144)

## 2018-09-23 ENCOUNTER — Encounter: Payer: Self-pay | Admitting: Cardiology

## 2018-09-23 NOTE — Progress Notes (Signed)
Remote ICD transmission.   

## 2018-09-29 ENCOUNTER — Telehealth: Payer: Self-pay | Admitting: Family Medicine

## 2018-09-29 DIAGNOSIS — G8929 Other chronic pain: Secondary | ICD-10-CM

## 2018-09-29 NOTE — Telephone Encounter (Signed)
Pt wife is calling and would like to have pt's hydrocodone refilled.

## 2018-10-01 MED ORDER — HYDROCODONE-ACETAMINOPHEN 5-325 MG PO TABS: ORAL_TABLET | ORAL | 0 refills | Status: DC

## 2018-10-04 ENCOUNTER — Ambulatory Visit (INDEPENDENT_AMBULATORY_CARE_PROVIDER_SITE_OTHER): Payer: Medicare Other

## 2018-10-04 DIAGNOSIS — I5022 Chronic systolic (congestive) heart failure: Secondary | ICD-10-CM | POA: Diagnosis not present

## 2018-10-04 DIAGNOSIS — Z95810 Presence of automatic (implantable) cardiac defibrillator: Secondary | ICD-10-CM

## 2018-10-06 NOTE — Progress Notes (Signed)
EPIC Encounter for ICM Monitoring  Patient Name: [name] is a 58 y.o. male Date: 10/06/2018 Primary Care Physican: Dickie La, MD Primary [place] Electrophysiologist: Caryl Comes LastWeight:288lbs (does not weight at home)  Transmission reviewed.    Optivol thoracic impedance significantly improved and trending closer to baseline normal.   Prescribed:Furosemide 40 mg 1 tablet asdirected. Prescribed differently: PatientreportsDr Harwani increased Furosemide 40 mg to 2 tablets (80 mg total) every morning.  Labs: 05/19/2018 Creatinine 1.74, BUN 26, Potassium 5.0, Sodium 140, GFR 42-49 08/26/2017 Creatinine 1.91, BUN 32, Potassium 5.6, Sodium 141, GFR 38-44  Recommendations:No changes  Follow-up plan: ICM clinic phone appointment on8/06/2018.   Copy of ICM check sent to [place].  3 month ICM trend: 10/04/2018    1 Year ICM trend:       Rosalene Billings, RN 10/06/2018 12:19 PM

## 2018-10-15 DIAGNOSIS — E119 Type 2 diabetes mellitus without complications: Secondary | ICD-10-CM | POA: Diagnosis not present

## 2018-10-15 DIAGNOSIS — I5020 Unspecified systolic (congestive) heart failure: Secondary | ICD-10-CM | POA: Diagnosis not present

## 2018-10-15 DIAGNOSIS — G4733 Obstructive sleep apnea (adult) (pediatric): Secondary | ICD-10-CM | POA: Diagnosis not present

## 2018-10-15 DIAGNOSIS — E785 Hyperlipidemia, unspecified: Secondary | ICD-10-CM | POA: Diagnosis not present

## 2018-10-15 DIAGNOSIS — R0789 Other chest pain: Secondary | ICD-10-CM | POA: Diagnosis not present

## 2018-10-15 DIAGNOSIS — I10 Essential (primary) hypertension: Secondary | ICD-10-CM | POA: Diagnosis not present

## 2018-10-15 DIAGNOSIS — I420 Dilated cardiomyopathy: Secondary | ICD-10-CM | POA: Diagnosis not present

## 2018-10-22 ENCOUNTER — Other Ambulatory Visit: Payer: Self-pay | Admitting: Family Medicine

## 2018-11-01 ENCOUNTER — Telehealth: Payer: Self-pay | Admitting: Family Medicine

## 2018-11-01 DIAGNOSIS — G8929 Other chronic pain: Secondary | ICD-10-CM

## 2018-11-01 NOTE — Telephone Encounter (Signed)
Pt wife calling to request refill of: Pain medication  Name of Medication(s):  hydrocodone Last date of OV:  09/15/2018 Pharmacy: [facility]  Will route refill request to Clinic RN.  Discussed with patient policy to call pharmacy for future refills.  Also, discussed refills may take up to 48 hours to approve or deny.  Eldred Manges [place]

## 2018-11-02 ENCOUNTER — Other Ambulatory Visit: Payer: Self-pay | Admitting: Family Medicine

## 2018-11-02 DIAGNOSIS — S39012A Strain of muscle, fascia and tendon of lower back, initial encounter: Secondary | ICD-10-CM

## 2018-11-03 MED ORDER — HYDROCODONE-ACETAMINOPHEN 5-325 MG PO TABS: ORAL_TABLET | ORAL | 0 refills | Status: DC

## 2018-11-08 ENCOUNTER — Ambulatory Visit (INDEPENDENT_AMBULATORY_CARE_PROVIDER_SITE_OTHER): Payer: Medicare Other

## 2018-11-08 DIAGNOSIS — I5022 Chronic systolic (congestive) heart failure: Secondary | ICD-10-CM

## 2018-11-08 DIAGNOSIS — Z95810 Presence of automatic (implantable) cardiac defibrillator: Secondary | ICD-10-CM | POA: Diagnosis not present

## 2018-11-09 NOTE — Progress Notes (Signed)
EPIC Encounter for ICM Monitoring  Patient Name: [name] is a 58 y.o. male Date: 11/09/2018 Primary Care Physican: Dickie La, MD Primary [place] Electrophysiologist: Caryl Comes LastWeight:288lbs (does not weight at home)  Transmission reviewed.  Optivol thoracic impedancenormal  Prescribed:Furosemide 40 mg 1 tablet by mouth as directed.  Labs: 05/19/2018 Creatinine 1.74, BUN 26, Potassium 5.0, Sodium 140, GFR 42-49 08/26/2017 Creatinine 1.91, BUN 32, Potassium 5.6, Sodium 141, GFR 38-44  Recommendations:No changes  Follow-up plan: ICM clinic phone appointment on9/02/2019.   Copy of ICM check sent to [place].   3 month ICM trend: 11/08/2018    1 Year ICM trend:       Rosalene Billings, RN 11/09/2018 3:59 PM

## 2018-11-23 ENCOUNTER — Other Ambulatory Visit: Payer: Self-pay | Admitting: Family Medicine

## 2018-11-26 DIAGNOSIS — E113293 Type 2 diabetes mellitus with mild nonproliferative diabetic retinopathy without macular edema, bilateral: Secondary | ICD-10-CM | POA: Diagnosis not present

## 2018-11-26 DIAGNOSIS — H401133 Primary open-angle glaucoma, bilateral, severe stage: Secondary | ICD-10-CM | POA: Diagnosis not present

## 2018-11-29 ENCOUNTER — Other Ambulatory Visit: Payer: Self-pay | Admitting: Family Medicine

## 2018-11-29 DIAGNOSIS — S39012A Strain of muscle, fascia and tendon of lower back, initial encounter: Secondary | ICD-10-CM

## 2018-12-01 ENCOUNTER — Other Ambulatory Visit: Payer: Self-pay

## 2018-12-01 ENCOUNTER — Encounter: Payer: Self-pay | Admitting: Family Medicine

## 2018-12-01 ENCOUNTER — Ambulatory Visit (INDEPENDENT_AMBULATORY_CARE_PROVIDER_SITE_OTHER): Payer: Medicare Other | Admitting: Family Medicine

## 2018-12-01 VITALS — BP 110/70 | HR 72 | Temp 98.1°F | Wt 300.0 lb

## 2018-12-01 DIAGNOSIS — G8929 Other chronic pain: Secondary | ICD-10-CM

## 2018-12-01 DIAGNOSIS — M170 Bilateral primary osteoarthritis of knee: Secondary | ICD-10-CM

## 2018-12-01 DIAGNOSIS — E785 Hyperlipidemia, unspecified: Secondary | ICD-10-CM

## 2018-12-01 DIAGNOSIS — N183 Chronic kidney disease, stage 3 (moderate): Secondary | ICD-10-CM

## 2018-12-01 DIAGNOSIS — Z23 Encounter for immunization: Secondary | ICD-10-CM

## 2018-12-01 DIAGNOSIS — E1122 Type 2 diabetes mellitus with diabetic chronic kidney disease: Secondary | ICD-10-CM

## 2018-12-01 DIAGNOSIS — M25569 Pain in unspecified knee: Secondary | ICD-10-CM | POA: Diagnosis not present

## 2018-12-01 LAB — POCT GLYCOSYLATED HEMOGLOBIN (HGB A1C): HbA1c, POC (controlled diabetic range): 7 % (ref 0.0–7.0)

## 2018-12-01 MED ORDER — HYDROCODONE-ACETAMINOPHEN 5-325 MG PO TABS: ORAL_TABLET | ORAL | 0 refills | Status: DC

## 2018-12-01 MED ORDER — ATORVASTATIN CALCIUM 80 MG PO TABS: 80.0000 mg | ORAL_TABLET | Freq: Every day | ORAL | 3 refills | Status: DC

## 2018-12-01 NOTE — Progress Notes (Signed)
    CHIEF COMPLAINT / HPI: #1.  Follow-up hyperlipidemia.  Had mentioned something to him about possibly switching him off the pravastatin.  He has been on and off long time.  Had some muscle aches with simvastatin but does not recall trying Lipitor before.  He is open to trial 2.  Congestive heart failure chronic: He has been feeling pretty well without any unusual shortness of breath and no unusual lower extremity edema Taking his medicines regularly. 3.  Diabetes mellitus: Has been trying to watch his diet.  Not exercising.  No episodes of low blood sugar.  REVIEW OF SYSTEMS: Denies fever, no cough, no unusual weight change.  Does continue to have knee pain secondary to arthritis  PERTINENT  PMH / PSH: I have reviewed the patient's medications, allergies, past medical and surgical history, smoking status and updated in the EMR as appropriate.   OBJECTIVE:  Vital signs reviewed. GENERAL: Well-developed, well-nourished, no acute distress. CARDIOVASCULAR: Regular rate and rhythm no murmur gallop or rub LUNGS: Clear to auscultation bilaterally, no rales or wheeze. ABDOMEN: Soft positive bowel sounds NEURO: No gross focal neurological deficits. MSK: Movement of extremity x 4.    ASSESSMENT / PLAN:   DM (diabetes mellitus), type 2 with renal complications (HCC) 123456 looks pretty good today.  Continue current medications.  I would like to see him doing more exercising.  Arthritis of knee, degenerative Chronic knee pain.  Refilled pain medicines  Hyperlipidemia with target low density lipoprotein (LDL) cholesterol less than 100 mg/dL Last LDL was 99.  We will switch him to 80 mg of Lipitor.  If he has any problems with this he will let me know.  He has myalgias, at first I would just decrease him to 40 and see how he does.  I think he would should be on a higher dose statin.

## 2018-12-02 ENCOUNTER — Encounter: Payer: Self-pay | Admitting: Family Medicine

## 2018-12-02 NOTE — Assessment & Plan Note (Signed)
Chronic knee pain.  Refilled pain medicines

## 2018-12-02 NOTE — Assessment & Plan Note (Signed)
Last LDL was 99.  We will switch him to 80 mg of Lipitor.  If he has any problems with this he will let me know.  He has myalgias, at first I would just decrease him to 40 and see how he does.  I think he would should be on a higher dose statin.

## 2018-12-02 NOTE — Assessment & Plan Note (Signed)
A1c looks pretty good today.  Continue current medications.  I would like to see him doing more exercising.

## 2018-12-14 ENCOUNTER — Telehealth: Payer: Self-pay

## 2018-12-14 DIAGNOSIS — M170 Bilateral primary osteoarthritis of knee: Secondary | ICD-10-CM

## 2018-12-14 DIAGNOSIS — G8929 Other chronic pain: Secondary | ICD-10-CM

## 2018-12-14 DIAGNOSIS — M25569 Pain in unspecified knee: Secondary | ICD-10-CM

## 2018-12-14 NOTE — Telephone Encounter (Signed)
Patients wife calls nurse line stating she was in the hospital over the weekend and had a family member come and stay with patient. [name] states his hydrocodone rx went "missing" while this said caretaker was there. [name] is unsure how to proceed. He was given #150 on 8/26 and takes 5 pills per day. [name] does know if he will have to wait until next rx is due, or if provider can send in enough until next script. Please advise.

## 2018-12-15 NOTE — Telephone Encounter (Signed)
Pt wife is calling to check on the status of getting a new prescription for pt if this is possible due to the circumstance or if he would have to wait for his next fill date.   Pt wife would like for someone to inform her of what will need to be done.

## 2018-12-16 ENCOUNTER — Ambulatory Visit (INDEPENDENT_AMBULATORY_CARE_PROVIDER_SITE_OTHER): Payer: Medicare Other | Admitting: *Deleted

## 2018-12-16 DIAGNOSIS — I428 Other cardiomyopathies: Secondary | ICD-10-CM | POA: Diagnosis not present

## 2018-12-16 DIAGNOSIS — I5022 Chronic systolic (congestive) heart failure: Secondary | ICD-10-CM

## 2018-12-16 MED ORDER — HYDROCODONE-ACETAMINOPHEN 5-325 MG PO TABS: ORAL_TABLET | ORAL | 0 refills | Status: DC

## 2018-12-16 NOTE — Assessment & Plan Note (Signed)
I know wife was in fact hospitalized.

## 2018-12-16 NOTE — Telephone Encounter (Signed)
I will provide a 5 day Rx.  Enough until Dr. Nori Riis returns.

## 2018-12-17 ENCOUNTER — Ambulatory Visit (INDEPENDENT_AMBULATORY_CARE_PROVIDER_SITE_OTHER): Payer: Medicare Other

## 2018-12-17 DIAGNOSIS — Z95810 Presence of automatic (implantable) cardiac defibrillator: Secondary | ICD-10-CM

## 2018-12-17 DIAGNOSIS — I5022 Chronic systolic (congestive) heart failure: Secondary | ICD-10-CM | POA: Diagnosis not present

## 2018-12-17 LAB — CUP PACEART REMOTE DEVICE CHECK
Battery Remaining Longevity: 103 mo
Battery Voltage: 2.99 V
Brady Statistic RV Percent Paced: 0.02 %
Date Time Interrogation Session: 20200911011705
HighPow Impedance: 46 Ohm
HighPow Impedance: 56 Ohm
Implantable Lead Implant Date: 20081114
Implantable Lead Location: 753860
Implantable Lead Model: 6947
Implantable Pulse Generator Implant Date: 20170222
Lead Channel Impedance Value: 399 Ohm
Lead Channel Impedance Value: 437 Ohm
Lead Channel Pacing Threshold Amplitude: 0.5 V
Lead Channel Pacing Threshold Pulse Width: 0.4 ms
Lead Channel Sensing Intrinsic Amplitude: 5.625 mV
Lead Channel Sensing Intrinsic Amplitude: 5.625 mV
Lead Channel Setting Pacing Amplitude: 2.5 V
Lead Channel Setting Pacing Pulse Width: 0.4 ms
Lead Channel Setting Sensing Sensitivity: 0.3 mV

## 2018-12-17 NOTE — Progress Notes (Signed)
EPIC Encounter for ICM Monitoring  Patient Name: [name] is a 58 y.o. male Date: 12/17/2018 Primary Care Physican: Dickie La, MD Primary [place] Electrophysiologist: Caryl Comes LastWeight:288lbs (does not weight at home)  Transmission reviewed.  Optivol thoracic impedancenormal  Prescribed:Furosemide 40 mg 1 tablet by mouth as directed.  Labs: 09/15/2018 Creatinine 1.46, BUN 23, Potassium 4.9, Sodium 142 05/19/2018 Creatinine 1.74, BUN 26, Potassium 5.0, Sodium 140, GFR 42-49 08/26/2017 Creatinine 1.91, BUN 32, Potassium 5.6, Sodium 141, GFR 38-44  Recommendations:No changes  Follow-up plan: ICM clinic phone appointment on 01/31/2019.   Copy of ICM check sent to [place].   3 month ICM trend: 12/16/2018    1 Year ICM trend:       Rosalene Billings, RN 12/17/2018 3:26 PM

## 2018-12-21 NOTE — Telephone Encounter (Signed)
He filled a rx on Aug 26 for #150 which is 30 days worth so there by our clinic policy I am cannot fill early. [name]

## 2018-12-27 NOTE — Progress Notes (Signed)
Remote ICD transmission.   

## 2018-12-28 ENCOUNTER — Other Ambulatory Visit: Payer: Self-pay | Admitting: Family Medicine

## 2018-12-30 ENCOUNTER — Telehealth: Payer: Self-pay | Admitting: Family Medicine

## 2018-12-30 DIAGNOSIS — G8929 Other chronic pain: Secondary | ICD-10-CM

## 2018-12-30 DIAGNOSIS — M170 Bilateral primary osteoarthritis of knee: Secondary | ICD-10-CM

## 2018-12-30 NOTE — Telephone Encounter (Signed)
Pt wife is calling and would like to have his hydrocodone refilled.

## 2018-12-31 MED ORDER — HYDROCODONE-ACETAMINOPHEN 5-325 MG PO TABS: ORAL_TABLET | ORAL | 0 refills | Status: DC

## 2019-01-04 ENCOUNTER — Telehealth: Payer: Self-pay | Admitting: *Deleted

## 2019-01-04 DIAGNOSIS — Z01818 Encounter for other preprocedural examination: Secondary | ICD-10-CM | POA: Diagnosis not present

## 2019-01-04 DIAGNOSIS — H401133 Primary open-angle glaucoma, bilateral, severe stage: Secondary | ICD-10-CM | POA: Diagnosis not present

## 2019-01-04 NOTE — Telephone Encounter (Signed)
Request for medical clearance. I called pt wife and confirmed that he sees Dr. Terrence Dupont, and was just seen last month.   I will forward to Dr. Terrence Dupont for clearance.

## 2019-01-04 NOTE — Telephone Encounter (Signed)
   [facility] Pre-operative Risk Assessment    Request for surgical clearance:  1. What type of surgery is being performed? DIODE CPC-LASER LEFT EYE TO LOWER IOP    2. When is this surgery scheduled? TBD   3. What type of clearance is required (medical clearance vs. Pharmacy clearance to hold med vs. Both)? MEDICAL, PT HAS ICD THOUGH REQUEST STATES FOR JUST CARDIAC CLEARANCE   4. Are there any medications that need to be held prior to surgery and how long? PER SURGEON WILL NOT NEED TO HOLD ANY BLOOD THINNERS   5. Practice name and name of physician performing surgery? [facility]; DR. Caryl Pina [place]   6. What is your office phone number [phone]    7.   What is your office fax number [phone]  8.   Anesthesia type (None, local, MAC, general) ? IV SEDATION   Julaine Hua 01/04/2019, 3:01 PM  _________________________________________________________________   (provider comments below)

## 2019-01-14 ENCOUNTER — Telehealth: Payer: Self-pay | Admitting: Family Medicine

## 2019-01-14 NOTE — Telephone Encounter (Signed)
Stop lipitor for now and restart in one week [name]

## 2019-01-14 NOTE — Telephone Encounter (Signed)
Having an issue with his Lipitor, when he lays down and then tries to get back up, the room is spinning.  They think it must be the Lipitor. Please advise them on what to do as they are thinking about going to the ED but do not want to, best phone is  [phone].

## 2019-01-14 NOTE — Telephone Encounter (Signed)
Wife informed. Christen Bame, CMA

## 2019-01-19 DIAGNOSIS — R55 Syncope and collapse: Secondary | ICD-10-CM | POA: Diagnosis not present

## 2019-01-19 DIAGNOSIS — E785 Hyperlipidemia, unspecified: Secondary | ICD-10-CM | POA: Diagnosis not present

## 2019-01-19 DIAGNOSIS — I472 Ventricular tachycardia: Secondary | ICD-10-CM | POA: Diagnosis not present

## 2019-01-19 DIAGNOSIS — I10 Essential (primary) hypertension: Secondary | ICD-10-CM | POA: Diagnosis not present

## 2019-01-19 DIAGNOSIS — G4733 Obstructive sleep apnea (adult) (pediatric): Secondary | ICD-10-CM | POA: Diagnosis not present

## 2019-01-19 DIAGNOSIS — I5020 Unspecified systolic (congestive) heart failure: Secondary | ICD-10-CM | POA: Diagnosis not present

## 2019-01-19 DIAGNOSIS — I420 Dilated cardiomyopathy: Secondary | ICD-10-CM | POA: Diagnosis not present

## 2019-01-19 DIAGNOSIS — K219 Gastro-esophageal reflux disease without esophagitis: Secondary | ICD-10-CM | POA: Diagnosis not present

## 2019-01-19 DIAGNOSIS — E119 Type 2 diabetes mellitus without complications: Secondary | ICD-10-CM | POA: Diagnosis not present

## 2019-01-25 ENCOUNTER — Other Ambulatory Visit: Payer: Self-pay | Admitting: Family Medicine

## 2019-01-25 DIAGNOSIS — S39012A Strain of muscle, fascia and tendon of lower back, initial encounter: Secondary | ICD-10-CM

## 2019-01-31 ENCOUNTER — Ambulatory Visit (INDEPENDENT_AMBULATORY_CARE_PROVIDER_SITE_OTHER): Payer: Medicare Other

## 2019-01-31 ENCOUNTER — Other Ambulatory Visit: Payer: Self-pay | Admitting: Family Medicine

## 2019-01-31 DIAGNOSIS — Z95810 Presence of automatic (implantable) cardiac defibrillator: Secondary | ICD-10-CM

## 2019-01-31 DIAGNOSIS — M25569 Pain in unspecified knee: Secondary | ICD-10-CM

## 2019-01-31 DIAGNOSIS — G8929 Other chronic pain: Secondary | ICD-10-CM

## 2019-01-31 DIAGNOSIS — I5022 Chronic systolic (congestive) heart failure: Secondary | ICD-10-CM

## 2019-01-31 DIAGNOSIS — M170 Bilateral primary osteoarthritis of knee: Secondary | ICD-10-CM

## 2019-01-31 NOTE — Telephone Encounter (Signed)
Patient's wife is calling requesting a refill on patients pain meds be sent to [facility].

## 2019-02-01 MED ORDER — HYDROCODONE-ACETAMINOPHEN 5-325 MG PO TABS: ORAL_TABLET | ORAL | 0 refills | Status: DC

## 2019-02-02 NOTE — Progress Notes (Signed)
EPIC Encounter for ICM Monitoring  Patient Name: [name] is a 58 y.o. male Date: 02/02/2019 Primary Care Physican: Dickie La, MD Primary [place] Electrophysiologist: Caryl Comes LastWeight:288lbs (does not weight at home)  Transmission reviewed.  Optivol thoracic impedancenormal  Prescribed:Furosemide 40 mg 1 tabletby mouth as directed.  Labs: 09/15/2018 Creatinine 1.46, BUN 23, Potassium 4.9, Sodium 142 05/19/2018 Creatinine 1.74, BUN 26, Potassium 5.0, Sodium 140, GFR 42-49 08/26/2017 Creatinine 1.91, BUN 32, Potassium 5.6, Sodium 141, GFR 38-44  Recommendations:No changes  Follow-up plan: ICM clinic phone appointment on 03/18/2019.   91 day device clinic remote transmission 03/17/2019.  Office appt 02/03/2019 with Tommye Standard, PA.    Copy of ICM check sent to Dr. Caryl Comes.   3 month ICM trend: 01/31/2019    1 Year ICM trend:       Rosalene Billings, RN 02/02/2019 2:26 PM

## 2019-02-03 ENCOUNTER — Other Ambulatory Visit: Payer: Self-pay

## 2019-02-03 ENCOUNTER — Ambulatory Visit (INDEPENDENT_AMBULATORY_CARE_PROVIDER_SITE_OTHER): Payer: Medicare Other | Admitting: Physician Assistant

## 2019-02-03 VITALS — BP 118/68 | HR 74 | Ht 62.0 in | Wt 285.0 lb

## 2019-02-03 DIAGNOSIS — I5022 Chronic systolic (congestive) heart failure: Secondary | ICD-10-CM

## 2019-02-03 DIAGNOSIS — I428 Other cardiomyopathies: Secondary | ICD-10-CM

## 2019-02-03 DIAGNOSIS — I10 Essential (primary) hypertension: Secondary | ICD-10-CM | POA: Diagnosis not present

## 2019-02-03 DIAGNOSIS — Z95810 Presence of automatic (implantable) cardiac defibrillator: Secondary | ICD-10-CM

## 2019-02-03 NOTE — Progress Notes (Signed)
Cardiology Office Note Date:  02/03/2019  Patient ID:  [name], [name] [birth year], MRN [id number] PCP:  Dickie La, MD  Cardiologist:  Dr. Terrence Dupont Electrophysiologist: Dr. Caryl Comes    Chief Complaint: annual visit  History of Present Illness: [name] is a 58 y.o. male with history of HTN, HLD, DM, TIA, NICM, chronic CHF (systolic), ICD, blindness 2/2 glaucoma.  He comes in today to be seen for Dr. Caryl Comes. Last seen by A. Lynnell Jude, NP for EP in Oct 2019, he was doing well, following with Dr. Terrence Dupont, no changes were made.  He feels well.  Denies any CP, palpitations or cardiac awareness.  No SOB, does not exercise, but denies difficulties with ADLs.  No dizzy spells, no near syncope or syncope, no shocks He saw Dr. Terrence Dupont recently, states he does his labs as well as his PMD. He tells me he can see shadows, no detail  Device History: MDT single chamber ICD implanted 2008 for NICM; gen change 2017 History of appropriate therapy: No History of AAD therapy: No  Past Medical History:  Diagnosis Date  . Arthritis   . Cataracts, bilateral    right  . Congestive heart failure ([place])    a. s/p MDT single chamber ICD   . Diabetes mellitus    does not check blood sugars at home  . Diverticulosis   . Glaucoma    bilateral  . H/O TIA (transient ischemic attack) and stroke   . Hyperlipidemia   . Hypertension   . Obesity     Past Surgical History:  Procedure Laterality Date  . CARDIAC CATHETERIZATION  10/24/2003   EF of 45-50%  . CARDIAC DEFIBRILLATOR PLACEMENT  2008  . CATARACT EXTRACTION  2012   right  . EP IMPLANTABLE DEVICE N/A 05/30/2015   Procedure: ICD Generator Changeout;  Surgeon: [name], MD;  Location: [facility];  Service: Cardiovascular;  Laterality: N/A;  . KNEE SURGERY Right 2000  . SHOULDER SURGERY  2012   left    Current Outpatient Medications  Medication Sig Dispense Refill  . aspirin 81 MG tablet Take 81 mg by mouth daily.     . benzonatate (TESSALON) 100 MG capsule Take 1 capsule (100 mg total) by mouth 2 (two) times daily as needed for cough. 20 capsule 0  . carvedilol (COREG) 25 MG tablet Take 1 tablet (25 mg total) by mouth 2 (two) times daily with a meal.    . COLCRYS 0.6 MG tablet TAKE ONE TABLET BY MOUTH EVERY DAY FOR FOR PAIN in FEET AS NEEDED 30 tablet 1  . cyclobenzaprine (FLEXERIL) 5 MG tablet Take 1 tablet (5 mg total) by mouth at bedtime. 30 tablet 1  . EPINEPHrine 0.3 mg/0.3 mL IJ SOAJ injection INJECT (0.14ml) into musle AS NEEDED 2 each 0  . furosemide (LASIX) 40 MG tablet Take 1 tablet (40 mg total) by mouth as directed. 90 tablet 0  . HYDROcodone-acetaminophen (NORCO/VICODIN) 5-325 MG tablet Take by mouth 2 in AM 1 at midday and 2 at bedtime for chronic knee pain do not fill before December 31 2018 150 tablet 0  . isosorbide mononitrate (ISMO,MONOKET) 20 MG tablet TAKE ONE TABLET BY MOUTH TWICE DAILY 180 tablet 3  . loratadine (CLARITIN) 10 MG tablet Take 1 tablet (10 mg total) by mouth daily. 30 tablet 11  . metFORMIN (GLUCOPHAGE) 1000 MG tablet TAKE ONE TABLET BY MOUTH TWICE DAILY WITH FOOD 180 tablet 6  . nitroGLYCERIN (NITROSTAT) 0.4  MG SL tablet Place 0.4 mg under the tongue as needed.    . sacubitril-valsartan (ENTRESTO) 49-51 MG Take 1 tablet by mouth 2 (two) times daily.    Marland Kitchen spironolactone (ALDACTONE) 25 MG tablet Take 25 mg by mouth 2 (two) times daily.      No current facility-administered medications for this visit.     Allergies:   Bee pollen and Nsaids   Social History:  The patient  reports that he has never smoked. He has never used smokeless tobacco. He reports that he does not drink alcohol or use drugs.   Family History:  The patient's family history includes Colon cancer in his father.  ROS:  Please see the history of present illness.  All other systems are reviewed and otherwise negative.   PHYSICAL EXAM:  VS:  BP 118/68   Pulse 74   Ht 5\' 2"  (1.575 m)   Wt 285 lb  (129.3 kg)   BMI 52.13 kg/m  BMI: Body mass index is 52.13 kg/m. Well nourished, well developed, in no acute distress  HEENT: normocephalic, atraumatic  Neck: no JVD, carotid bruits or masses Cardiac:  RRR; no significant murmurs, no rubs, or gallops Lungs:  CTA b/l, no wheezing, rhonchi or rales  Abd: soft, nontender, obese MS: no deformity or atrophy Ext: no edema  Skin: warm and dry, no rash Neuro:  No gross deficits appreciated Psych: euthymic mood, full affect  ICD site is stable, no tethering or discomfort   EKG:  Done today and reviewed by myself shows SR 78bpm, no acute changes ICD interrogation done today and reviewed by myself:  Battery and lead measurements are good 4 NSVT episodes  EGMs reviewed in detail, there is no morphology change in comparison to today's known SR, I think these were ST with perhaps PATs   05/01/15: TTE Study Conclusions - Left ventricle: The cavity size was normal. There was moderate   focal basal hypertrophy of the septum. Systolic function was   mildly to moderately reduced. The estimated ejection fraction was   in the range of 40% to 45%. Diffuse hypokinesis. Doppler   parameters are consistent with abnormal left ventricular   relaxation (grade 1 diastolic dysfunction). - Aortic valve: Transvalvular velocity was within the normal range.   There was no stenosis. There was no regurgitation. - Mitral valve: Transvalvular velocity was within the normal range.   There was no evidence for stenosis. There was no regurgitation. - Right ventricle: The cavity size was moderately dilated. Wall   thickness was normal. Systolic function was normal. - Tricuspid valve: There was no regurgitation.    Recent Labs: 05/19/2018: ALT 8 09/15/2018: BUN 23; Creatinine, Ser 1.46; Potassium 4.9; Sodium 142  05/19/2018: LDL Direct 99   CrCl cannot be calculated (Patient's most recent lab result is older than the maximum 21 days allowed.).   Wt Readings from  Last 3 Encounters:  02/03/19 285 lb (129.3 kg)  12/01/18 300 lb (136.1 kg)  06/09/18 282 lb (127.9 kg)     Other studies reviewed: Additional studies/records reviewed today include: summarized above  ASSESSMENT AND PLAN:  1. ICD     Intact function, no programming changes made  2. NICM 3. Chronic CHF     No symptoms or exam findings to suggest volume OL currently     optiVol looks good     On BB, entresto, nitrate, aldactone, lasiax  4. HTN     Looks OK, no changes  5. HLD  Monitored by Dr. Terrence Dupont     Disposition: F/u with Dr. Terrence Dupont as directed by him, continue Q 3 mo remotes and annual in-clinic with [place].  Current medicines are reviewed at length with the patient today.  The patient did not have any concerns regarding medicines.  Venetia Night, PA-C 02/03/2019 4:34 PM     [address] [place] [id number] [phone] (office)  [phone] (fax)

## 2019-02-03 NOTE — Patient Instructions (Signed)
Medication Instructions:   Your physician recommends that you continue on your current medications as directed. Please refer to the Current Medication list given to you today.  *If you need a refill on your cardiac medications before your next appointment, please call your pharmacy*  Lab Work: [place]   If you have labs (blood work) drawn today and your tests are completely normal, you will receive your results only by: Marland Kitchen MyChart Message (if you have MyChart) OR . A paper copy in the mail If you have any lab test that is abnormal or we need to change your treatment, we will call you to review the results.  Testing/Procedures: NONE ORDERED  TODAY   Follow-Up: At [facility], you and your health needs are our priority.  As part of our continuing mission to provide you with exceptional heart care, we have created designated Provider Care Teams.  These Care Teams include your primary Cardiologist (physician) and Advanced Practice Providers (APPs -  Physician Assistants and Nurse Practitioners) who all work together to provide you with the care you need, when you need it.  Your next appointment:   12 months  The format for your next appointment:   In Person  Provider:   Dr. Lyla Son   Other Instructions

## 2019-02-15 ENCOUNTER — Other Ambulatory Visit: Payer: Self-pay | Admitting: Family Medicine

## 2019-02-15 ENCOUNTER — Telehealth: Payer: Self-pay | Admitting: Family Medicine

## 2019-02-15 MED ORDER — METRONIDAZOLE 500 MG PO TABS: 500.0000 mg | ORAL_TABLET | Freq: Two times a day (BID) | ORAL | 0 refills | Status: DC

## 2019-02-15 NOTE — Telephone Encounter (Signed)
Already done

## 2019-02-15 NOTE — Telephone Encounter (Signed)
Patient wife thinks she has trichomonis again and would like for you to call in a prescription for herself and her husband to [facility] please if you can.  Her phone number is [phone].

## 2019-02-15 NOTE — Progress Notes (Signed)
Wife being ts for trich

## 2019-02-22 DIAGNOSIS — E785 Hyperlipidemia, unspecified: Secondary | ICD-10-CM | POA: Diagnosis not present

## 2019-02-22 DIAGNOSIS — I10 Essential (primary) hypertension: Secondary | ICD-10-CM | POA: Diagnosis not present

## 2019-02-22 DIAGNOSIS — E119 Type 2 diabetes mellitus without complications: Secondary | ICD-10-CM | POA: Diagnosis not present

## 2019-02-23 ENCOUNTER — Ambulatory Visit (INDEPENDENT_AMBULATORY_CARE_PROVIDER_SITE_OTHER): Payer: Medicare Other | Admitting: Family Medicine

## 2019-02-23 ENCOUNTER — Encounter: Payer: Self-pay | Admitting: Family Medicine

## 2019-02-23 ENCOUNTER — Other Ambulatory Visit: Payer: Self-pay

## 2019-02-23 VITALS — BP 102/62 | HR 76 | Wt 291.0 lb

## 2019-02-23 DIAGNOSIS — E785 Hyperlipidemia, unspecified: Secondary | ICD-10-CM | POA: Diagnosis not present

## 2019-02-23 DIAGNOSIS — N183 Chronic kidney disease, stage 3 unspecified: Secondary | ICD-10-CM

## 2019-02-23 DIAGNOSIS — G8929 Other chronic pain: Secondary | ICD-10-CM

## 2019-02-23 DIAGNOSIS — M170 Bilateral primary osteoarthritis of knee: Secondary | ICD-10-CM | POA: Diagnosis not present

## 2019-02-23 DIAGNOSIS — M25569 Pain in unspecified knee: Secondary | ICD-10-CM

## 2019-02-23 DIAGNOSIS — E1122 Type 2 diabetes mellitus with diabetic chronic kidney disease: Secondary | ICD-10-CM

## 2019-02-23 DIAGNOSIS — N1832 Chronic kidney disease, stage 3b: Secondary | ICD-10-CM

## 2019-02-23 LAB — POCT GLYCOSYLATED HEMOGLOBIN (HGB A1C): HbA1c, POC (controlled diabetic range): 6.5 % (ref 0.0–7.0)

## 2019-02-23 MED ORDER — ROSUVASTATIN CALCIUM 10 MG PO TABS: 10.0000 mg | ORAL_TABLET | Freq: Every day | ORAL | 3 refills | Status: DC

## 2019-02-23 MED ORDER — HYDROCODONE-ACETAMINOPHEN 5-325 MG PO TABS: ORAL_TABLET | ORAL | 0 refills | Status: DC

## 2019-02-23 NOTE — Assessment & Plan Note (Signed)
I am still not convinced his side effects of headache and dizziness were related to the atorvastatin but he is not does not want to restart that so we will restart Crestor at 10 mg and follow him up in 2 months.

## 2019-02-23 NOTE — Assessment & Plan Note (Signed)
Recheck labs today. 

## 2019-02-23 NOTE — Assessment & Plan Note (Signed)
Continue current pain medicine regimen and refills given.  No red flags.

## 2019-02-23 NOTE — Assessment & Plan Note (Signed)
Good control.  It would still benefit him to do some weight loss and exercise.

## 2019-02-23 NOTE — Progress Notes (Signed)
    CHIEF COMPLAINT / HPI: #1.  Follow-up diabetes mellitus.  No episodes of low blood sugar.  No problems with medications.  He is not exercising regularly partly due to his worsening vision. 2.  Hypertension and significant cardiovascular disease: He continues to follow with cardiology.  He is taking his medicines without problem.   #3.  Hyperlipidemia: We had stopped his pravastatin as his LDL was 99 on that and tried him on Lipitor.  He had headaches and dizziness with Lipitor.  We stopped it and those have resolved.  He would like to try something different rather than retrying the Lipitor.  REVIEW OF SYSTEMS: No unusual weight change, no unusual shortness of breath.  Exercise tolerance remains the same.  Denies chest pain.  See HPI.  PERTINENT  PMH / PSH: I have reviewed the patient's medications, allergies, past medical and surgical history, smoking status and updated in the EMR as appropriate.   OBJECTIVE:  Vital signs reviewed. GENERAL: Well-developed, well-nourished, no acute distress. CARDIOVASCULAR: Regular rate and rhythm no murmur gallop or rub LUNGS: Clear to auscultation bilaterally, no rales or wheeze. ABDOMEN: Soft positive bowel sounds NEURO: No gross focal neurological deficits. MSK: Movement of extremity x 4.  Knees bilaterally are symmetrical without effusion.  He has pain with full extension and flexion on the left greater than the right.  Medial joint line tenderness bilaterally.  Calf is soft.  Walks with cane for vision.    ASSESSMENT / PLAN:   Arthritis of knee, degenerative Continue current pain medicine regimen and refills given.  No red flags.  Hyperlipidemia with target low density lipoprotein (LDL) cholesterol less than 100 mg/dL I am still not convinced his side effects of headache and dizziness were related to the atorvastatin but he is not does not want to restart that so we will restart Crestor at 10 mg and follow him up in 2 months.  DM (diabetes  mellitus), type 2 with renal complications (HCC) Good control.  It would still benefit him to do some weight loss and exercise.  RENAL DISEASE, CHRONIC, STAGE III Recheck labs today.

## 2019-02-23 NOTE — Patient Instructions (Signed)
Great to see you!   

## 2019-02-24 LAB — CMP14+EGFR
ALT: 20 IU/L (ref 0–44)
AST: 19 IU/L (ref 0–40)
Albumin/Globulin Ratio: 1.6 (ref 1.2–2.2)
Albumin: 4.1 g/dL (ref 3.8–4.9)
Alkaline Phosphatase: 62 IU/L (ref 39–117)
BUN/Creatinine Ratio: 11 (ref 10–24)
BUN: 20 mg/dL (ref 8–27)
Bilirubin Total: 0.2 mg/dL (ref 0.0–1.2)
CO2: 19 mmol/L — ABNORMAL LOW (ref 20–29)
Calcium: 9.6 mg/dL (ref 8.6–10.2)
Chloride: 106 mmol/L (ref 96–106)
Creatinine, Ser: 1.79 mg/dL — ABNORMAL HIGH (ref 0.76–1.27)
GFR calc Af Amer: 47 mL/min/{1.73_m2} — ABNORMAL LOW (ref >59)
GFR calc non Af Amer: 40 mL/min/{1.73_m2} — ABNORMAL LOW (ref >59)
Globulin, Total: 2.6 g/dL (ref 1.5–4.5)
Glucose: 133 mg/dL — ABNORMAL HIGH (ref 65–99)
Potassium: 5.1 mmol/L (ref 3.5–5.2)
Sodium: 141 mmol/L (ref 134–144)
Total Protein: 6.7 g/dL (ref 6.0–8.5)

## 2019-02-25 ENCOUNTER — Encounter: Payer: Self-pay | Admitting: Family Medicine

## 2019-03-17 ENCOUNTER — Ambulatory Visit (INDEPENDENT_AMBULATORY_CARE_PROVIDER_SITE_OTHER): Payer: Medicare Other | Admitting: *Deleted

## 2019-03-17 DIAGNOSIS — Z95810 Presence of automatic (implantable) cardiac defibrillator: Secondary | ICD-10-CM | POA: Diagnosis not present

## 2019-03-17 LAB — CUP PACEART REMOTE DEVICE CHECK
Battery Remaining Longevity: 100 mo
Battery Voltage: 2.99 V
Brady Statistic RV Percent Paced: 0.42 %
Date Time Interrogation Session: 20201210052823
HighPow Impedance: 38 Ohm
HighPow Impedance: 44 Ohm
Implantable Lead Implant Date: 20081114
Implantable Lead Location: 753860
Implantable Lead Model: 6947
Implantable Pulse Generator Implant Date: 20170222
Lead Channel Impedance Value: 380 Ohm
Lead Channel Impedance Value: 399 Ohm
Lead Channel Pacing Threshold Amplitude: 0.625 V
Lead Channel Pacing Threshold Pulse Width: 0.4 ms
Lead Channel Sensing Intrinsic Amplitude: 5.375 mV
Lead Channel Sensing Intrinsic Amplitude: 5.375 mV
Lead Channel Setting Pacing Amplitude: 2.5 V
Lead Channel Setting Pacing Pulse Width: 0.4 ms
Lead Channel Setting Sensing Sensitivity: 0.3 mV

## 2019-03-18 ENCOUNTER — Ambulatory Visit (INDEPENDENT_AMBULATORY_CARE_PROVIDER_SITE_OTHER): Payer: Medicare Other

## 2019-03-18 ENCOUNTER — Other Ambulatory Visit: Payer: Self-pay | Admitting: *Deleted

## 2019-03-18 DIAGNOSIS — I5022 Chronic systolic (congestive) heart failure: Secondary | ICD-10-CM | POA: Diagnosis not present

## 2019-03-18 DIAGNOSIS — Z95810 Presence of automatic (implantable) cardiac defibrillator: Secondary | ICD-10-CM

## 2019-03-18 DIAGNOSIS — M25569 Pain in unspecified knee: Secondary | ICD-10-CM

## 2019-03-18 DIAGNOSIS — G8929 Other chronic pain: Secondary | ICD-10-CM

## 2019-03-18 DIAGNOSIS — M170 Bilateral primary osteoarthritis of knee: Secondary | ICD-10-CM

## 2019-03-18 NOTE — Telephone Encounter (Signed)
Wife states that patient asked for this refill when he was in the office with her on Wednesday.  Will forward to MD. [name]

## 2019-03-21 NOTE — Telephone Encounter (Signed)
Patient wife calling again about this refill, she wasn't sure if she had mentioned it on the phone when she spoke to [place] on Friday.   Patient wife also requesting his results because he is still having burning with urination.

## 2019-03-22 ENCOUNTER — Other Ambulatory Visit: Payer: Self-pay | Admitting: Family Medicine

## 2019-03-22 MED ORDER — HYDROCODONE-ACETAMINOPHEN 5-325 MG PO TABS: ORAL_TABLET | ORAL | 0 refills | Status: DC

## 2019-03-22 MED ORDER — METRONIDAZOLE 500 MG PO TABS: 500.0000 mg | ORAL_TABLET | Freq: Two times a day (BID) | ORAL | 0 refills | Status: AC

## 2019-03-22 NOTE — Progress Notes (Signed)
Phone call from his wife. He is still having some sx. I will treat empirically for trichomonas as that is most likely organism. IF he does not totally resolve, I will have him let us know. Would recommend either STI emopiric tx vs or in addition to UA.

## 2019-03-22 NOTE — Progress Notes (Signed)
EPIC Encounter for ICM Monitoring  Patient Name: BRONISLAUS JEFF is a 58 y.o. male Date: 03/22/2019 Primary Care Physican: Dickie La, MD Primary [place] Electrophysiologist: Caryl Comes LastWeight:288lbs (does not weight at home)  Transmission reviewed.  Optivol thoracic impedancetrending back to baseline normal.  Prescribed:Furosemide 40 mg 1 tabletby mouth as directed.  Labs: 02/23/2019 Creatinine 1.79, BUN 20, Potassium 5.1, Sodium 141, GFR 40-47 09/15/2018 Creatinine 1.46, BUN 23, Potassium 4.9, Sodium 142 05/19/2018 Creatinine 1.74, BUN 26, Potassium 5.0, Sodium 140, GFR 42-49 08/26/2017 Creatinine 1.91, BUN 32, Potassium 5.6, Sodium 141, GFR 38-44  Recommendations:No changes  Follow-up plan: ICM clinic phone appointment on 03/25/2019 to recheck fluid levels.   91 day device clinic remote transmission 06/16/2019.    Copy of ICM check sent to Dr. Caryl Comes.   3 month ICM trend: 03/18/2019    1 Year ICM trend:       Rosalene Billings, RN 03/22/2019 3:54 PM

## 2019-03-28 ENCOUNTER — Other Ambulatory Visit: Payer: Self-pay | Admitting: Family Medicine

## 2019-03-28 DIAGNOSIS — S39012A Strain of muscle, fascia and tendon of lower back, initial encounter: Secondary | ICD-10-CM

## 2019-04-23 NOTE — Progress Notes (Signed)
ICD remote 

## 2019-04-25 ENCOUNTER — Ambulatory Visit (INDEPENDENT_AMBULATORY_CARE_PROVIDER_SITE_OTHER): Payer: Medicare Other

## 2019-04-25 ENCOUNTER — Other Ambulatory Visit: Payer: Self-pay | Admitting: *Deleted

## 2019-04-25 DIAGNOSIS — I5022 Chronic systolic (congestive) heart failure: Secondary | ICD-10-CM | POA: Diagnosis not present

## 2019-04-25 DIAGNOSIS — Z95810 Presence of automatic (implantable) cardiac defibrillator: Secondary | ICD-10-CM | POA: Diagnosis not present

## 2019-04-25 DIAGNOSIS — M170 Bilateral primary osteoarthritis of knee: Secondary | ICD-10-CM

## 2019-04-25 DIAGNOSIS — G8929 Other chronic pain: Secondary | ICD-10-CM

## 2019-04-25 MED ORDER — HYDROCODONE-ACETAMINOPHEN 5-325 MG PO TABS: ORAL_TABLET | ORAL | 0 refills | Status: DC

## 2019-04-25 NOTE — Telephone Encounter (Signed)
Patient needs a refill on his pain medication.  Venice Marcucci,CMA

## 2019-04-26 ENCOUNTER — Telehealth: Payer: Self-pay

## 2019-04-26 NOTE — Telephone Encounter (Signed)
New Message  Patient's wife is calling in to speak with Sharman Cheek about patient's device. Please give patient a call back to assist.

## 2019-04-26 NOTE — Telephone Encounter (Signed)
Returned call and spoke with wife.  Advised she is not listed on a DPR to speak with her and would need permission from patient.  Patient is asleep and she requested to be call back after 3:30 to talk with patient.  Will have a DPR form mailed to patient.

## 2019-04-26 NOTE — Progress Notes (Signed)
EPIC Encounter for ICM Monitoring  Patient Name: [name] is a 59 y.o. male Date: 04/26/2019 Primary Care Physican: Dickie La, MD Primary [place] Electrophysiologist: Caryl Comes LastWeight:unknown, does not have scales at home.  Spoke with patient and he is doing well.  He gives permission to speak with wife when needed.  Advised will mail DPR form and once it is completed mail it back to the office.   Optivol thoracic impedancenormal..    Prescribed:Furosemide 40 mg 1 tabletby mouth as directed.  Labs: 02/23/2019 Creatinine 1.79, BUN 20, Potassium 5.1, Sodium 141, GFR 40-47 09/15/2018 Creatinine 1.46, BUN 23, Potassium 4.9, Sodium 142 05/19/2018 Creatinine 1.74, BUN 26, Potassium 5.0, Sodium 140, GFR 42-49 08/26/2017 Creatinine 1.91, BUN 32, Potassium 5.6, Sodium 141, GFR 38-44  Recommendations:No changes and encouraged to call if experiencing any fluid symptoms.  Follow-up plan: ICM clinic phone appointment on2/22/2021. 91 day device clinic remote transmission 06/16/2019.   Copy of ICM check sent to [place].   3 month ICM trend: 04/25/2019    1 Year ICM trend:       Rosalene Billings, RN 04/26/2019 2:42 PM

## 2019-05-05 ENCOUNTER — Telehealth: Payer: Self-pay | Admitting: Family Medicine

## 2019-05-05 NOTE — Telephone Encounter (Signed)
Patient's wife calling to get a doctors note stating she is there primary care taker of Josheph. She would like to know if she can get this letter without patient having to come in and be seen. Please call patients wife to discuss this.

## 2019-05-09 NOTE — Telephone Encounter (Signed)
Pt wife is calling to check on the status of having this letter written. She said she needs to have it done by Wednesday. Pt wife asks that Dr. Nori Riis or her nurse call to discuss what this letter is for. The best call back number is [phone].

## 2019-05-10 ENCOUNTER — Encounter: Payer: Self-pay | Admitting: Family Medicine

## 2019-05-10 NOTE — Telephone Encounter (Signed)
RN team  I am putting this on your physical desk top. Please apologize for me tardiness. THANKS! [name]

## 2019-05-10 NOTE — Telephone Encounter (Signed)
Patient's wife informed

## 2019-05-10 NOTE — Telephone Encounter (Signed)
Wife is calling to check on the status of husband's letter.  States that she is coming by tomorrow and won't leave until she gets a letter for him.  Ahmir Bracken,CMA

## 2019-05-11 ENCOUNTER — Other Ambulatory Visit: Payer: Self-pay | Admitting: Family Medicine

## 2019-05-19 ENCOUNTER — Other Ambulatory Visit: Payer: Self-pay | Admitting: Family Medicine

## 2019-05-19 DIAGNOSIS — S39012A Strain of muscle, fascia and tendon of lower back, initial encounter: Secondary | ICD-10-CM

## 2019-05-23 NOTE — Telephone Encounter (Signed)
Pt wife is calling for refill on pain medicine said they were told to call in a week in advance. [name]

## 2019-05-25 DIAGNOSIS — I5020 Unspecified systolic (congestive) heart failure: Secondary | ICD-10-CM | POA: Diagnosis not present

## 2019-05-25 DIAGNOSIS — I10 Essential (primary) hypertension: Secondary | ICD-10-CM | POA: Diagnosis not present

## 2019-05-25 DIAGNOSIS — R55 Syncope and collapse: Secondary | ICD-10-CM | POA: Diagnosis not present

## 2019-05-25 DIAGNOSIS — N189 Chronic kidney disease, unspecified: Secondary | ICD-10-CM | POA: Diagnosis not present

## 2019-05-25 DIAGNOSIS — E119 Type 2 diabetes mellitus without complications: Secondary | ICD-10-CM | POA: Diagnosis not present

## 2019-05-25 DIAGNOSIS — E785 Hyperlipidemia, unspecified: Secondary | ICD-10-CM | POA: Diagnosis not present

## 2019-05-25 DIAGNOSIS — I420 Dilated cardiomyopathy: Secondary | ICD-10-CM | POA: Diagnosis not present

## 2019-05-25 DIAGNOSIS — K219 Gastro-esophageal reflux disease without esophagitis: Secondary | ICD-10-CM | POA: Diagnosis not present

## 2019-05-25 DIAGNOSIS — I472 Ventricular tachycardia: Secondary | ICD-10-CM | POA: Diagnosis not present

## 2019-05-27 ENCOUNTER — Other Ambulatory Visit: Payer: Self-pay

## 2019-05-27 ENCOUNTER — Other Ambulatory Visit: Payer: Self-pay | Admitting: Family Medicine

## 2019-05-27 DIAGNOSIS — G8929 Other chronic pain: Secondary | ICD-10-CM

## 2019-05-27 DIAGNOSIS — M170 Bilateral primary osteoarthritis of knee: Secondary | ICD-10-CM

## 2019-05-27 MED ORDER — HYDROCODONE-ACETAMINOPHEN 5-325 MG PO TABS: ORAL_TABLET | ORAL | 0 refills | Status: DC

## 2019-05-27 NOTE — Telephone Encounter (Signed)
Pt wife is calling and would like to have pt's hydrocodone refilled. °

## 2019-05-27 NOTE — Telephone Encounter (Signed)
Refill request sent to Dr. Neal. Maudry Zeidan T Aneli Zara, CMA  

## 2019-05-30 ENCOUNTER — Telehealth: Payer: Self-pay

## 2019-05-30 ENCOUNTER — Ambulatory Visit (INDEPENDENT_AMBULATORY_CARE_PROVIDER_SITE_OTHER): Payer: Medicare Other

## 2019-05-30 DIAGNOSIS — Z95810 Presence of automatic (implantable) cardiac defibrillator: Secondary | ICD-10-CM

## 2019-05-30 DIAGNOSIS — I5022 Chronic systolic (congestive) heart failure: Secondary | ICD-10-CM

## 2019-05-30 NOTE — Telephone Encounter (Signed)
Remote ICM transmission received.  Attempted call to patient regarding ICM remote transmission and left message per DPR to return call.   

## 2019-05-30 NOTE — Progress Notes (Signed)
EPIC Encounter for ICM Monitoring  Patient Name: [name] is a 59 y.o. male Date: 05/30/2019 Primary Care Physican: Dickie La, MD Primary [place] Electrophysiologist: Caryl Comes LastWeight:unknown, does not have scales at home.  Attempted call to patient and unable to reach.  Left message to return call. Transmission reviewed.   Optivol thoracic impedancedecreased suggesting possible fluid accumulation since 05/27/2019.    Prescribed:Furosemide 40 mg 1 tabletby mouth as directed and he takes it as needed.  Labs: 02/23/2019 Creatinine 1.79, BUN 20, Potassium 5.1, Sodium 141, GFR 40-47 09/15/2018 Creatinine 1.46, BUN 23, Potassium 4.9, Sodium 142 05/19/2018 Creatinine 1.74, BUN 26, Potassium 5.0, Sodium 140, GFR 42-49 08/26/2017 Creatinine 1.91, BUN 32, Potassium 5.6, Sodium 141, GFR 38-44  Recommendations:Unable to reach.    Follow-up plan: ICM clinic phone appointment on3/04/2019 (manual send) to recheck fluid symptoms. 91 day device clinic remote transmission3/02/2020.   Copy of ICM check sent to Dr.Klein and [name].  3 month ICM trend: 05/30/2019    1 Year ICM trend:       Rosalene Billings, RN 05/30/2019 4:00 PM

## 2019-05-31 NOTE — Progress Notes (Signed)
Spoke with patient and he denies fluid symptoms.  Reviewed transmission and thinks maybe he is drinking too much fluid.  He took PRN Furosemide yesterday and will take another tomorrow.  Will recheck fluid levels next week.  Advised to call if he experiences any fluid symptoms.

## 2019-06-07 ENCOUNTER — Telehealth: Payer: Self-pay

## 2019-06-07 NOTE — Telephone Encounter (Signed)
Left message for patient to remind of missed remote transmission.  

## 2019-06-08 ENCOUNTER — Ambulatory Visit: Payer: Medicare Other | Admitting: Family Medicine

## 2019-06-09 ENCOUNTER — Telehealth: Payer: Self-pay | Admitting: *Deleted

## 2019-06-09 ENCOUNTER — Other Ambulatory Visit: Payer: Self-pay | Admitting: Family Medicine

## 2019-06-09 NOTE — Telephone Encounter (Signed)
Received call from Pharmacy.  Colchicine is not covered by insurance but mitigare is.   They will need a new script sent in for mitigare.  To PCP. Christen Bame, CMA

## 2019-06-16 ENCOUNTER — Ambulatory Visit (INDEPENDENT_AMBULATORY_CARE_PROVIDER_SITE_OTHER): Payer: Medicare Other | Admitting: *Deleted

## 2019-06-16 DIAGNOSIS — Z95810 Presence of automatic (implantable) cardiac defibrillator: Secondary | ICD-10-CM

## 2019-06-16 LAB — CUP PACEART REMOTE DEVICE CHECK
Battery Remaining Longevity: 97 mo
Battery Voltage: 2.98 V
Brady Statistic RV Percent Paced: 0.14 %
Date Time Interrogation Session: 20210311072307
HighPow Impedance: 39 Ohm
HighPow Impedance: 48 Ohm
Implantable Lead Implant Date: 20081114
Implantable Lead Location: 753860
Implantable Lead Model: 6947
Implantable Pulse Generator Implant Date: 20170222
Lead Channel Impedance Value: 342 Ohm
Lead Channel Impedance Value: 399 Ohm
Lead Channel Pacing Threshold Amplitude: 0.5 V
Lead Channel Pacing Threshold Pulse Width: 0.4 ms
Lead Channel Sensing Intrinsic Amplitude: 6.25 mV
Lead Channel Sensing Intrinsic Amplitude: 6.25 mV
Lead Channel Setting Pacing Amplitude: 2.5 V
Lead Channel Setting Pacing Pulse Width: 0.4 ms
Lead Channel Setting Sensing Sensitivity: 0.3 mV

## 2019-06-17 NOTE — Progress Notes (Signed)
ICD Remote  

## 2019-06-20 ENCOUNTER — Telehealth: Payer: Self-pay | Admitting: Family Medicine

## 2019-06-20 DIAGNOSIS — M25569 Pain in unspecified knee: Secondary | ICD-10-CM

## 2019-06-20 DIAGNOSIS — M170 Bilateral primary osteoarthritis of knee: Secondary | ICD-10-CM

## 2019-06-20 DIAGNOSIS — G8929 Other chronic pain: Secondary | ICD-10-CM

## 2019-06-20 DIAGNOSIS — H401133 Primary open-angle glaucoma, bilateral, severe stage: Secondary | ICD-10-CM | POA: Diagnosis not present

## 2019-06-20 NOTE — Telephone Encounter (Signed)
[name] is calling requesting to have his hydrocodone refilled.

## 2019-06-21 MED ORDER — COLCHICINE 0.6 MG PO CAPS: ORAL_CAPSULE | ORAL | 3 refills | Status: DC

## 2019-06-21 MED ORDER — HYDROCODONE-ACETAMINOPHEN 5-325 MG PO TABS: ORAL_TABLET | ORAL | 0 refills | Status: DC

## 2019-06-22 ENCOUNTER — Encounter: Payer: Self-pay | Admitting: Family Medicine

## 2019-06-22 ENCOUNTER — Other Ambulatory Visit: Payer: Self-pay

## 2019-06-22 ENCOUNTER — Ambulatory Visit (INDEPENDENT_AMBULATORY_CARE_PROVIDER_SITE_OTHER): Payer: Medicare Other | Admitting: Family Medicine

## 2019-06-22 VITALS — BP 118/78 | HR 61 | Wt 329.0 lb

## 2019-06-22 DIAGNOSIS — I472 Ventricular tachycardia, unspecified: Secondary | ICD-10-CM

## 2019-06-22 DIAGNOSIS — Z800 Family history of malignant neoplasm of digestive organs: Secondary | ICD-10-CM

## 2019-06-22 DIAGNOSIS — N183 Chronic kidney disease, stage 3 unspecified: Secondary | ICD-10-CM

## 2019-06-22 DIAGNOSIS — E1122 Type 2 diabetes mellitus with diabetic chronic kidney disease: Secondary | ICD-10-CM | POA: Diagnosis not present

## 2019-06-22 DIAGNOSIS — N184 Chronic kidney disease, stage 4 (severe): Secondary | ICD-10-CM | POA: Diagnosis not present

## 2019-06-22 DIAGNOSIS — I5022 Chronic systolic (congestive) heart failure: Secondary | ICD-10-CM

## 2019-06-22 DIAGNOSIS — I4729 Other ventricular tachycardia: Secondary | ICD-10-CM

## 2019-06-22 DIAGNOSIS — M170 Bilateral primary osteoarthritis of knee: Secondary | ICD-10-CM

## 2019-06-22 HISTORY — DX: Other ventricular tachycardia: I47.29

## 2019-06-22 HISTORY — DX: Ventricular tachycardia, unspecified: I47.20

## 2019-06-22 HISTORY — DX: Ventricular tachycardia: I47.2

## 2019-06-22 LAB — POCT GLYCOSYLATED HEMOGLOBIN (HGB A1C): HbA1c, POC (controlled diabetic range): 8 % — AB (ref 0.0–7.0)

## 2019-06-22 NOTE — Patient Instructions (Signed)
Increase your Lasix also known as furosemide from once a day to twice a day starting today and doing that through Sunday.  On Monday go back to regular once a day dose.  I am giving you a card for a nurse visit on Tuesday to get a recheck on your weight and get some blood work.  I do think you should go ahead and get the Covid vaccine whenever you get a chance.  I am putting in a referral for your follow-up colonoscopy and you will get a call from [facility] gastroenterology.  I am also putting in a referral for the orthopedic surgeon to take a look at your right knee.  Once I get your blood work back from the test on Tuesday, I will call you and let you know if we need to adjust anything else.

## 2019-06-22 NOTE — Progress Notes (Addendum)
    CHIEF COMPLAINT / HPI: 1. F/u DM: No episodes of low blood sugar. Taking meds regularly. No problems 2. CHF: feels like he is a little more short of breath with everyday activities in the last 2 weeks. Taking eds regularly. Also Lower extremities feel a little tighter. Does not weigh himself at home. No chest pain 3. Right knee is bothering hi a lot. Has given out on him twice. He does not want injection but wants to see orthopedist.   PERTINENT  PMH / [place]: I have reviewed the patient's medications, allergies, past medical and surgical history, smoking status and updated in the EMR as appropriate.   OBJECTIVE:  BP 118/78   Pulse 61   Wt (!) 329 lb (149.2 kg)   SpO2 99%   BMI 60.17 kg/m  Weight gain since last ov in November 38 pounds Vital signs reviewed. GENERAL: Well-developed, well-nourished, no acute distress. CARDIOVASCULAR: Regular rate and rhythm no murmur gallop or rub LUNGS: Clear to auscultation bilaterally, no rales or wheeze. ABDOMEN: Soft positive bowel sounds.  NEURO: essentially blind MSK: Movement of extremity x 4. Right knee no effusion. TTP medial and lateral joint line. Calf is soft. Extension and flexion essentially full although some pain at full extension. Nonpitting edema bilaterally lower extremity with chronic skin changes of venous stasis   ASSESSMENT / PLAN:   Chronic systolic heart failure ([place]) I am concerned taht part of his weight gain is fluid overload. Will increase lasix to BID for rest of week. RTC Early next week for nurse visit to get bmp and weight check.  RENAL DISEASE, CHRONIC, STAGE III Increasing lasix for 1 week with recheck creatinine afterward  Arthritis of knee, degenerative I offered CSI. He wants to see orthopedist so I will refer. Sounds like he may have meniscal issue with new mechanical symptoms. He might be a candidate for arthroscopy. He is not an ideal candidate for TKR given his multiple comorbidities.    [name]  MD

## 2019-06-23 ENCOUNTER — Other Ambulatory Visit: Payer: Self-pay | Admitting: Family Medicine

## 2019-06-23 LAB — BASIC METABOLIC PANEL
BUN/Creatinine Ratio: 12 (ref 10–24)
BUN: 20 mg/dL (ref 8–27)
CO2: 23 mmol/L (ref 20–29)
Calcium: 9.1 mg/dL (ref 8.6–10.2)
Chloride: 106 mmol/L (ref 96–106)
Creatinine, Ser: 1.66 mg/dL — ABNORMAL HIGH (ref 0.76–1.27)
GFR calc Af Amer: 51 mL/min/{1.73_m2} — ABNORMAL LOW (ref >59)
GFR calc non Af Amer: 44 mL/min/{1.73_m2} — ABNORMAL LOW (ref >59)
Glucose: 166 mg/dL — ABNORMAL HIGH (ref 65–99)
Potassium: 4.7 mmol/L (ref 3.5–5.2)
Sodium: 142 mmol/L (ref 134–144)

## 2019-06-24 NOTE — Assessment & Plan Note (Signed)
I offered CSI. He wants to see orthopedist so I will refer. Sounds like he may have meniscal issue with new mechanical symptoms. He might be a candidate for arthroscopy. He is not an ideal candidate for TKR given his multiple comorbidities.

## 2019-06-24 NOTE — Assessment & Plan Note (Signed)
I am concerned taht part of his weight gain is fluid overload. Will increase lasix to BID for rest of week. RTC Early next week for nurse visit to get bmp and weight check.

## 2019-06-24 NOTE — Assessment & Plan Note (Signed)
Increasing lasix for 1 week with recheck creatinine afterward

## 2019-06-28 ENCOUNTER — Other Ambulatory Visit: Payer: Medicare Other

## 2019-06-28 ENCOUNTER — Ambulatory Visit (INDEPENDENT_AMBULATORY_CARE_PROVIDER_SITE_OTHER): Payer: Medicare Other

## 2019-06-28 ENCOUNTER — Other Ambulatory Visit: Payer: Self-pay

## 2019-06-28 DIAGNOSIS — I5022 Chronic systolic (congestive) heart failure: Secondary | ICD-10-CM

## 2019-06-28 DIAGNOSIS — E1122 Type 2 diabetes mellitus with diabetic chronic kidney disease: Secondary | ICD-10-CM | POA: Diagnosis not present

## 2019-06-28 DIAGNOSIS — R635 Abnormal weight gain: Secondary | ICD-10-CM

## 2019-06-28 DIAGNOSIS — N183 Chronic kidney disease, stage 3 unspecified: Secondary | ICD-10-CM

## 2019-06-28 NOTE — Progress Notes (Signed)
Patient reports to nurse clinic for weight check. Patient was seen in office on 3/17 and weight was 329 lbs. Today's weight: 309.2 lbs.    Per Dr. Nori Riis, patient is to be schedule follow up in 4-6 weeks.    Talbot Grumbling, RN

## 2019-06-29 LAB — BASIC METABOLIC PANEL
BUN/Creatinine Ratio: 12 (ref 10–24)
BUN: 23 mg/dL (ref 8–27)
CO2: 23 mmol/L (ref 20–29)
Calcium: 8.9 mg/dL (ref 8.6–10.2)
Chloride: 103 mmol/L (ref 96–106)
Creatinine, Ser: 1.95 mg/dL — ABNORMAL HIGH (ref 0.76–1.27)
GFR calc Af Amer: 42 mL/min/{1.73_m2} — ABNORMAL LOW (ref >59)
GFR calc non Af Amer: 36 mL/min/{1.73_m2} — ABNORMAL LOW (ref >59)
Glucose: 266 mg/dL — ABNORMAL HIGH (ref 65–99)
Potassium: 4.5 mmol/L (ref 3.5–5.2)
Sodium: 140 mmol/L (ref 134–144)

## 2019-06-30 ENCOUNTER — Encounter: Payer: Self-pay | Admitting: Family Medicine

## 2019-06-30 NOTE — Progress Notes (Signed)
Your kidneys did not like the increased lasix but it seemed to help with your fluid overload. I will check and make sure they have returned to their baseline when I see you back.

## 2019-07-04 ENCOUNTER — Ambulatory Visit (INDEPENDENT_AMBULATORY_CARE_PROVIDER_SITE_OTHER): Payer: Medicare Other

## 2019-07-04 DIAGNOSIS — I5022 Chronic systolic (congestive) heart failure: Secondary | ICD-10-CM

## 2019-07-04 DIAGNOSIS — Z95810 Presence of automatic (implantable) cardiac defibrillator: Secondary | ICD-10-CM | POA: Diagnosis not present

## 2019-07-05 NOTE — Progress Notes (Signed)
EPIC Encounter for ICM Monitoring  Patient Name: NEILS SIRACUSA is a 59 y.o. male Date: 07/05/2019 Primary Care Physican: Dickie La, MD Primary [place] Electrophysiologist: Caryl Comes LastWeight:309 lbs  Spoke with wife. She stated patient had a visit with Dr Nori Riis on 3/17 due to SOB and leg swelling.  Dr Nori Riis increased Furosemide to 40 mg bid x 1 week.  Patient's weight dropped from 329 lbs to 309 lbs after taking extra Furosemide.  Optivol thoracic impedancenormal but was suggesting fluid accumulation 3/13 - 3/18 during which time extra Furosemide was taken.  Prescribed:Furosemide 40 mg 1 tabletby mouth daily.  Labs: 06/28/2019 Creatinine 1.95, BUN 23, Potassium 4.5, Sodium 140, GFR 06/22/2019 Creatinine 1.66, BUN 20, Potassium 4.7, Sodium 142, GFR 44-51  Recommendations:No changes and encouraged to call if experiencing any fluid symptoms.  Follow-up plan: ICM clinic phone appointment on5/06/2019. 91 day device clinic remote transmission6/01/2020.   Copy of ICM check sent to [place].  3 month ICM trend: 07/04/2019    1 Year ICM trend:       Rosalene Billings, RN 07/05/2019 3:29 PM

## 2019-07-05 NOTE — Progress Notes (Signed)
Call back to wife.  Asked if there is a scale at home for patient to weigh and she said no.  Advised will send a SW request to get him a scale at no cost. She said that would be great and he would use it if he had one.  Request sent to SW.

## 2019-07-08 ENCOUNTER — Telehealth: Payer: Self-pay | Admitting: Licensed Clinical Social Worker

## 2019-07-08 DIAGNOSIS — Z23 Encounter for immunization: Secondary | ICD-10-CM | POA: Diagnosis not present

## 2019-07-08 NOTE — Telephone Encounter (Signed)
CSW referred to assist patient with obtaining a scale. CSW contacted patient to inform scale will be delivered to home. Patient grateful for support and assistance. CSW available as needed. Jackie Assata Juncaj, LCSW, CCSW-MCS [phone]  

## 2019-07-13 ENCOUNTER — Ambulatory Visit (INDEPENDENT_AMBULATORY_CARE_PROVIDER_SITE_OTHER): Payer: Medicare Other | Admitting: Orthopaedic Surgery

## 2019-07-13 ENCOUNTER — Telehealth: Payer: Self-pay | Admitting: Licensed Clinical Social Worker

## 2019-07-13 ENCOUNTER — Other Ambulatory Visit: Payer: Self-pay

## 2019-07-13 ENCOUNTER — Ambulatory Visit: Payer: Self-pay

## 2019-07-13 ENCOUNTER — Encounter: Payer: Self-pay | Admitting: Orthopaedic Surgery

## 2019-07-13 DIAGNOSIS — G8929 Other chronic pain: Secondary | ICD-10-CM | POA: Diagnosis not present

## 2019-07-13 DIAGNOSIS — M25561 Pain in right knee: Secondary | ICD-10-CM

## 2019-07-13 NOTE — Telephone Encounter (Signed)
CSW reordered scale due to technical challenge and will be shipped to patient;s home.  CSW available as needed. Raquel Sarna, [place], [place]

## 2019-07-13 NOTE — Progress Notes (Signed)
Office Visit Note   Patient: [name]           Date of Birth: 11-Jul-1960           MRN: [id number] Visit Date: 07/13/2019              Requested by: Dickie La, MD [address] [place],  [place] [address] PCP: Dickie La, MD   Assessment & Plan: Visit Diagnoses:  1. Acute pain of right knee   2. Chronic pain of right knee     Plan: Given the severity of the patient's right knee arthritis, an arthroscopic intervention is not warranted.  This would really not do anything more than provide temporary relief the same as a steroid injection would.  I did recommend a steroid injection for his knee but he has deferred this.  The other things he can work on his outpatient physical therapy to strengthen the quad muscles in his knee and consider knee sleeve.  There is really nothing else that I can offer.  He is truly not a surgical candidate given the uncertain his blood glucose control but also his weight and being on a blood thinner.  The only surgical option would be a knee replacement.  This was discussed in detail.  I have recommended at least topical Voltaren gel which would have a safer profile for him.  All question concerns were answered and addressed.  Follow-up is as needed.  Follow-Up Instructions: Return if symptoms worsen or fail to improve.   Orders:  Orders Placed This Encounter  Procedures  . XR Knee 1-2 Views Right   No orders of the defined types were placed in this encounter.     Procedures: No procedures performed   Clinical Data: No additional findings.   Subjective: Chief Complaint  Patient presents with  . Right Knee - Pain  The patient is a very pleasant 59 year old gentleman with multiple medical issues who is referred from Dr. Nori Riis to evaluate and treat right knee pain.  Her thoughts were whether or not the patient could benefit from even an knee arthroscopy.  The patient has deferred any steroid injection in the past for his right  knee.  He does report daily knee pain whether he sitting standing or walking.  He ambulates with a cane.  He is morbidly obese and his last weight was 329 pounds.  He is also on blood thinning medication.  I saw in the chart just 2 weeks ago his blood glucose was running high.  His wife is with him and states that they do not check it on a daily basis.  He has never had surgery on this knee before.  He points to mainly the medial joint line as a source of his knee pain.  HPI  Review of Systems Today he denies any headache, chest pain, shortness of breath, fever, chills, nausea, vomiting  Objective: Vital Signs: There were no vitals taken for this visit.  Physical Exam He is alert and oriented and follows commands appropriately.  He is clinically blind. Ortho Exam Examination of his right knee shows significant varus malalignment.  There is patellofemoral crepitation throughout his arc of motion.  He has medial joint line tenderness.  The knee is ligamentously stable with no effusion. Specialty Comments:  No specialty comments available.  Imaging: XR Knee 1-2 Views Right  Result Date: 07/13/2019 2 views of the right knee show severe arthritic findings.  There is varus malalignment.  There is almost complete loss of the medial joint space with close to bone-on-bone wear.  There is significant patellofemoral disease and periarticular osteophytes.    PMFS History: Patient Active Problem List   Diagnosis Date Noted  . Paroxysmal VT ([place]) 06/22/2019  . Lumbar strain 09/15/2018  . Testicular pain 05/21/2018  . Trigger finger, acquired 11/20/2017  . Glaucoma 11/07/2015  . NICM (nonischemic cardiomyopathy) ([place]) 05/30/2015  . DOE (dyspnea on exertion) 09/20/2014  . Encounter for chronic pain management 05/17/2014  . Automatic implantable cardioverter-defibrillator in situ 07/15/2013  . One eye blind, one eye low vision 06/15/2013  . Arthritis of knee, degenerative 10/03/2011  .  Hyperlipidemia with target low density lipoprotein (LDL) cholesterol less than 100 mg/dL 08/20/2011  . Single implantable cardioverter-defibrillator (ICD) in situ 06/17/2011  . OBESITY, MORBID 05/21/2010  . SHOULDER PAIN, LEFT 08/01/2009  . Obstructive sleep apnea 03/27/2009  . DM (diabetes mellitus), type 2 with renal complications ([place]) 73/22/0254  . HYPERTENSION, BENIGN 02/18/2007  . Chronic systolic heart failure ([place]) 02/18/2007  . RENAL DISEASE, CHRONIC, STAGE III 02/18/2007   Past Medical History:  Diagnosis Date  . Arthritis   . Cataracts, bilateral    right  . Congestive heart failure ([place])    a. s/p MDT single chamber ICD   . Diabetes mellitus    does not check blood sugars at home  . Diverticulosis   . Glaucoma    bilateral  . H/O TIA (transient ischemic attack) and stroke   . Hyperlipidemia   . Hypertension   . Obesity     Family History  Problem Relation Age of Onset  . Colon cancer Father   . Esophageal cancer Neg Hx   . Rectal cancer Neg Hx   . Stomach cancer Neg Hx     Past Surgical History:  Procedure Laterality Date  . CARDIAC CATHETERIZATION  10/24/2003   EF of 45-50%  . CARDIAC DEFIBRILLATOR PLACEMENT  2008  . CATARACT EXTRACTION  2012   right  . EP IMPLANTABLE DEVICE N/A 05/30/2015   Procedure: ICD Generator Changeout;  Surgeon: [name], MD;  Location: [facility];  Service: Cardiovascular;  Laterality: N/A;  . KNEE SURGERY Right 2000  . SHOULDER SURGERY  2012   left   Social History   Occupational History  . Not on file  Tobacco Use  . Smoking status: Never Smoker  . Smokeless tobacco: Never Used  Substance and Sexual Activity  . Alcohol use: No  . Drug use: No  . Sexual activity: Not on file

## 2019-07-18 ENCOUNTER — Telehealth: Payer: Self-pay

## 2019-07-18 ENCOUNTER — Other Ambulatory Visit: Payer: Self-pay | Admitting: Family Medicine

## 2019-07-18 ENCOUNTER — Other Ambulatory Visit: Payer: Self-pay

## 2019-07-18 ENCOUNTER — Telehealth: Payer: Self-pay | Admitting: Family Medicine

## 2019-07-18 DIAGNOSIS — M170 Bilateral primary osteoarthritis of knee: Secondary | ICD-10-CM

## 2019-07-18 DIAGNOSIS — S39012A Strain of muscle, fascia and tendon of lower back, initial encounter: Secondary | ICD-10-CM

## 2019-07-18 DIAGNOSIS — G8929 Other chronic pain: Secondary | ICD-10-CM

## 2019-07-18 NOTE — Telephone Encounter (Signed)
Calling a week in advance for refill on pain medicine.

## 2019-07-18 NOTE — Telephone Encounter (Signed)
Pharmacy calls nurse line regarding patient's rx for Mitagere 0.6 mg capsule. Per pharmacist, rx needs to be written for Mitagere (brand name), no substitutions, for insurance purposes.   To PCP  Talbot Grumbling, RN

## 2019-07-19 MED ORDER — COLCHICINE 0.6 MG PO CAPS: ORAL_CAPSULE | ORAL | 3 refills | Status: DC

## 2019-07-19 MED ORDER — HYDROCODONE-ACETAMINOPHEN 5-325 MG PO TABS: ORAL_TABLET | ORAL | 0 refills | Status: DC

## 2019-07-19 NOTE — Telephone Encounter (Signed)
Spoke with his wife and informed them refill called in [name]

## 2019-08-08 ENCOUNTER — Ambulatory Visit (INDEPENDENT_AMBULATORY_CARE_PROVIDER_SITE_OTHER): Payer: Medicare Other

## 2019-08-08 DIAGNOSIS — Z95810 Presence of automatic (implantable) cardiac defibrillator: Secondary | ICD-10-CM

## 2019-08-08 DIAGNOSIS — I5022 Chronic systolic (congestive) heart failure: Secondary | ICD-10-CM

## 2019-08-09 NOTE — Progress Notes (Signed)
EPIC Encounter for ICM Monitoring  Patient Name: [name] is a 59 y.o. male Date: 08/09/2019 Primary Care Physican: Dickie La, MD Primary [place] Electrophysiologist: Caryl Comes LastWeight:309 lbs  Attempted call to patient and unable to reach.  Left detailed message per DPR regarding transmission. Transmission reviewed.   Optivol thoracic impedancenormal.  Prescribed:Furosemide 40 mg 1 tabletby mouth daily.  Labs: 06/28/2019 Creatinine 1.95, BUN 23, Potassium 4.5, Sodium 140, GFR 06/22/2019 Creatinine 1.66, BUN 20, Potassium 4.7, Sodium 142, GFR 44-51  Recommendations:Left voice mail with ICM number and encouraged to call if experiencing any fluid symptoms.  Follow-up plan: ICM clinic phone appointment on6/10/2019. 91 day device clinic remote transmission6/01/2020.   Copy of ICM check sent to [facility].  3 month ICM trend: 08/08/2019    1 Year ICM trend:       Rosalene Billings, RN 08/09/2019 5:10 PM

## 2019-08-15 DIAGNOSIS — Z23 Encounter for immunization: Secondary | ICD-10-CM | POA: Diagnosis not present

## 2019-08-18 ENCOUNTER — Other Ambulatory Visit: Payer: Self-pay

## 2019-08-18 ENCOUNTER — Telehealth: Payer: Self-pay | Admitting: Family Medicine

## 2019-08-18 DIAGNOSIS — G8929 Other chronic pain: Secondary | ICD-10-CM

## 2019-08-18 DIAGNOSIS — M170 Bilateral primary osteoarthritis of knee: Secondary | ICD-10-CM

## 2019-08-18 MED ORDER — HYDROCODONE-ACETAMINOPHEN 5-325 MG PO TABS: ORAL_TABLET | ORAL | 0 refills | Status: DC

## 2019-08-18 NOTE — Telephone Encounter (Signed)
Patients wife is calling and would like to have his oxycodone refilled.

## 2019-08-29 DIAGNOSIS — I10 Essential (primary) hypertension: Secondary | ICD-10-CM | POA: Diagnosis not present

## 2019-08-29 DIAGNOSIS — G4733 Obstructive sleep apnea (adult) (pediatric): Secondary | ICD-10-CM | POA: Diagnosis not present

## 2019-08-29 DIAGNOSIS — R55 Syncope and collapse: Secondary | ICD-10-CM | POA: Diagnosis not present

## 2019-08-29 DIAGNOSIS — E785 Hyperlipidemia, unspecified: Secondary | ICD-10-CM | POA: Diagnosis not present

## 2019-08-29 DIAGNOSIS — I420 Dilated cardiomyopathy: Secondary | ICD-10-CM | POA: Diagnosis not present

## 2019-08-29 DIAGNOSIS — I472 Ventricular tachycardia: Secondary | ICD-10-CM | POA: Diagnosis not present

## 2019-08-29 DIAGNOSIS — I5020 Unspecified systolic (congestive) heart failure: Secondary | ICD-10-CM | POA: Diagnosis not present

## 2019-08-29 DIAGNOSIS — N189 Chronic kidney disease, unspecified: Secondary | ICD-10-CM | POA: Diagnosis not present

## 2019-09-08 ENCOUNTER — Telehealth: Payer: Self-pay | Admitting: Internal Medicine

## 2019-09-08 NOTE — Telephone Encounter (Signed)
Patient's wife states that her husband is coughing a lot. She thinks he may have fluid in his lungs. She states they sent in a transmission and would like someone to read it. I tried contacted the device clinic 3 times but it kept going straight to voicemail. She denies him having any SOB, chest pain, dizziness and swelling. He took 2 lasix about 2 hours ago but that has not seemed to help. Please advise.

## 2019-09-08 NOTE — Telephone Encounter (Signed)
Spoke with pt who states his "coughing spell" has subsided.  Pt denies CP, SOB, dizziness or edema.  Pt advised message has been sent to device clinic to have pt's transmission reviewed.  Pt advised if he develops cough again to notify office or if he develops any of the above symptoms to go to ED for further evaluation.  Pt verbalizes understanding and agrees with current plan.

## 2019-09-09 ENCOUNTER — Telehealth (INDEPENDENT_AMBULATORY_CARE_PROVIDER_SITE_OTHER): Payer: Medicare Other | Admitting: Family Medicine

## 2019-09-09 ENCOUNTER — Ambulatory Visit (INDEPENDENT_AMBULATORY_CARE_PROVIDER_SITE_OTHER): Payer: Medicare Other

## 2019-09-09 ENCOUNTER — Telehealth: Payer: Self-pay

## 2019-09-09 ENCOUNTER — Other Ambulatory Visit: Payer: Self-pay

## 2019-09-09 ENCOUNTER — Ambulatory Visit (INDEPENDENT_AMBULATORY_CARE_PROVIDER_SITE_OTHER)
Admission: EM | Admit: 2019-09-09 | Discharge: 2019-09-09 | Disposition: A | Discharge status: Home / Self Care | Payer: Medicare Other | Source: Home / Self Care

## 2019-09-09 ENCOUNTER — Encounter: Payer: Self-pay | Admitting: Family Medicine

## 2019-09-09 ENCOUNTER — Encounter (HOSPITAL_COMMUNITY): Payer: Self-pay | Admitting: Emergency Medicine

## 2019-09-09 DIAGNOSIS — E1136 Type 2 diabetes mellitus with diabetic cataract: Secondary | ICD-10-CM | POA: Insufficient documentation

## 2019-09-09 DIAGNOSIS — Z825 Family history of asthma and other chronic lower respiratory diseases: Secondary | ICD-10-CM

## 2019-09-09 DIAGNOSIS — I428 Other cardiomyopathies: Secondary | ICD-10-CM

## 2019-09-09 DIAGNOSIS — Z886 Allergy status to analgesic agent status: Secondary | ICD-10-CM | POA: Insufficient documentation

## 2019-09-09 DIAGNOSIS — E669 Obesity, unspecified: Secondary | ICD-10-CM | POA: Insufficient documentation

## 2019-09-09 DIAGNOSIS — Z7982 Long term (current) use of aspirin: Secondary | ICD-10-CM | POA: Insufficient documentation

## 2019-09-09 DIAGNOSIS — R05 Cough: Secondary | ICD-10-CM

## 2019-09-09 DIAGNOSIS — R000 Tachycardia, unspecified: Secondary | ICD-10-CM | POA: Insufficient documentation

## 2019-09-09 DIAGNOSIS — Z79899 Other long term (current) drug therapy: Secondary | ICD-10-CM | POA: Insufficient documentation

## 2019-09-09 DIAGNOSIS — N189 Chronic kidney disease, unspecified: Secondary | ICD-10-CM | POA: Insufficient documentation

## 2019-09-09 DIAGNOSIS — E1122 Type 2 diabetes mellitus with diabetic chronic kidney disease: Secondary | ICD-10-CM | POA: Insufficient documentation

## 2019-09-09 DIAGNOSIS — Z8673 Personal history of transient ischemic attack (TIA), and cerebral infarction without residual deficits: Secondary | ICD-10-CM | POA: Insufficient documentation

## 2019-09-09 DIAGNOSIS — I130 Hypertensive heart and chronic kidney disease with heart failure and stage 1 through stage 4 chronic kidney disease, or unspecified chronic kidney disease: Secondary | ICD-10-CM | POA: Insufficient documentation

## 2019-09-09 DIAGNOSIS — J9600 Acute respiratory failure, unspecified whether with hypoxia or hypercapnia: Secondary | ICD-10-CM | POA: Diagnosis not present

## 2019-09-09 DIAGNOSIS — Z95810 Presence of automatic (implantable) cardiac defibrillator: Secondary | ICD-10-CM | POA: Insufficient documentation

## 2019-09-09 DIAGNOSIS — R062 Wheezing: Secondary | ICD-10-CM | POA: Diagnosis not present

## 2019-09-09 DIAGNOSIS — Z7984 Long term (current) use of oral hypoglycemic drugs: Secondary | ICD-10-CM | POA: Insufficient documentation

## 2019-09-09 DIAGNOSIS — R0602 Shortness of breath: Secondary | ICD-10-CM

## 2019-09-09 DIAGNOSIS — E785 Hyperlipidemia, unspecified: Secondary | ICD-10-CM | POA: Insufficient documentation

## 2019-09-09 DIAGNOSIS — I5022 Chronic systolic (congestive) heart failure: Secondary | ICD-10-CM | POA: Insufficient documentation

## 2019-09-09 DIAGNOSIS — Z20822 Contact with and (suspected) exposure to covid-19: Secondary | ICD-10-CM | POA: Insufficient documentation

## 2019-09-09 DIAGNOSIS — I509 Heart failure, unspecified: Secondary | ICD-10-CM

## 2019-09-09 DIAGNOSIS — R059 Cough, unspecified: Secondary | ICD-10-CM

## 2019-09-09 DIAGNOSIS — J9601 Acute respiratory failure with hypoxia: Secondary | ICD-10-CM | POA: Diagnosis not present

## 2019-09-09 MED ORDER — BENZONATATE 100 MG PO CAPS: 100.0000 mg | ORAL_CAPSULE | Freq: Three times a day (TID) | ORAL | 0 refills | Status: DC | PRN

## 2019-09-09 NOTE — Telephone Encounter (Signed)
Wife called to have remote transmission reviewed due to patient has been coughing a lot.  Advised reviewed remote transmission sent on 09/08/19 and report suggests fluid levels are normal.  She reports cough is a little better but still persistent.  Denies patient has shortness of breath.  Advised to call PCP or use ER if needed for further evaluation.  She appreciated the call back. ICM remote transmission scheduled for 09/12/2019.

## 2019-09-09 NOTE — Telephone Encounter (Signed)
Patient's wife, Aldona Bar, calls nurse line regarding patient having cough x 2 days. Denies shortness of breath, chest pain, and dizziness.Wife is concerned because she feels that fluid is building up. Wife reports no other symptoms. Patient has had both COVID vaccinations and has had no sick contacts.   ED precautions given to wife. Precepted with Dr. Gwendlyn Deutscher who advised patient to be scheduled virtually. Patient scheduled virtual visit for this morning with Dr. Jeannine Kitten.   To PCP and Dr. Jacquelyne Balint, RN

## 2019-09-09 NOTE — Progress Notes (Signed)
[employer] Telemedicine Visit  Patient consented to have virtual visit and was identified by name and date of birth. Method of visit: Telephone  Encounter participants: Patient: [name] - located at home Provider: Benay Pike - located at [facility] Others (if applicable): wife  Chief Complaint: cough, DOE  HPI:  Cough: Patient's wife is on the phone with patient in background.  She states patient has had 2 days of increasing cough.  Cough is productive, but patient unable to expel sputum.  Also when he is laying on his back and left side he has difficulty breathing.  He also complains of dyspnea on exertion when walking to the bathroom.  He talked with the cardiologist this morning who did a readout of the implantable cardio defibrillator that did not show any excess fluid in his lungs, but patient's wife is convinced that this is similar to previous episodes when he had excess fluid.  He takes his furosemide "as needed ".  He does not take it every day.  He took 2 yesterday morning and 2 this morning due to the cough and dyspnea.  His wife states that he does not have any increased swelling on his legs.  He had increased urination yesterday, but not so far this morning.  She weighed him and he weighed 329 pounds this morning.  His wife states that he was recently started on 2 medications, one of his rosuvastatin, and the other is glimepiride 4 mg.  He has had no sick contacts, no fevers, and has had both of his Covid vaccinations.  ROS: per HPI  Pertinent PMHx: HFrEF  Exam:  There were no vitals taken for this visit.  Respiratory: Patient letting wife do most of the talking due to coughing spasms when he speaks.  Patient coughing in background.  Assessment/Plan:  Cough Patient with 2 days of cough that sounds most likely secondary to heart failure over pulmonary process, but evaluation is limited by being unable to do a physical exam.  When he had a similar  issue in the beginning of May, he was told to take 2 doses of furosemide daily for 1 week.  After doing this he appeared to have lost roughly 20 pounds and weighed approximately 309 pounds.  He currently weighs 20 pounds heavier than he did at that time according to his wife.  This combined with the fact that he has only been taking his furosemide as needed and not daily, makes me feel like this is fluid overload related to his heart failure.  Given that we cannot rule out an infectious process such as pneumonia, I advised the patient and his wife to go to the urgent care so that they can do a physical evaluation and x-rays if necessary.  Advised his wife to follow-up with [place] after she finishes at the urgent care if she still has any questions.    Time spent during visit with patient: 15 minutes

## 2019-09-09 NOTE — ED Triage Notes (Signed)
Pts wife brings him in today due to SOB and wheezing x 3 days ago. Pt is  wheezing during triage. Wife states his PCP told him to come get a chest xray. Took two Lasix this morning.

## 2019-09-09 NOTE — Assessment & Plan Note (Addendum)
Patient with 2 days of cough that sounds most likely secondary to heart failure over pulmonary process, but evaluation is limited by being unable to do a physical exam.  When he had a similar issue in the beginning of May, he was told to take 2 doses of furosemide daily for 1 week.  After doing this he appeared to have lost roughly 20 pounds and weighed approximately 309 pounds.  He currently weighs 20 pounds heavier than he did at that time according to his wife.  This combined with the fact that he has only been taking his furosemide as needed and not daily, makes me feel like this is fluid overload related to his heart failure.  Given that we cannot rule out an infectious process such as pneumonia, I advised the patient and his wife to go to the urgent care so that they can do a physical evaluation and x-rays if necessary.  Advised his wife to follow-up with [place] after she finishes at the urgent care if she still has any questions.

## 2019-09-09 NOTE — ED Provider Notes (Signed)
[facility]   MRN: [id number] DOB: 1960/04/20  Subjective:   [name] is a 59 y.o. male with past medical history of chronic systolic heart failure, CKD, dyspnea on exertion, nonischemic cardiomyopathy, status ICD presenting for 3-day history of acute onset cough, shortness of breath, wheezing.  Patient's family contacted the PCP and they recommended he take 2 doses of his Lasix 40 mg which she did today.  They have maintained all other medications and came in due to persistent shortness of breath.  He has had Covid vaccination, last dose was in April.  His cardiologist is Dr. Terrence Dupont.  No current facility-administered medications for this encounter.  Current Outpatient Medications:  .  aspirin 81 MG tablet, Take 81 mg by mouth daily., Disp: , Rfl:  .  carvedilol (COREG) 25 MG tablet, Take 1 tablet (25 mg total) by mouth 2 (two) times daily with a meal., Disp: , Rfl:  .  Colchicine (MITIGARE) 0.6 MG CAPS, Take one by mouth daily for gout, Disp: 90 capsule, Rfl: 3 .  cyclobenzaprine (FLEXERIL) 5 MG tablet, Take 1 tablet (5 mg total) by mouth at bedtime., Disp: 30 tablet, Rfl: 1 .  EPINEPHrine 0.3 mg/0.3 mL IJ SOAJ injection, INJECT (0.83ml) into musle AS NEEDED, Disp: 2 each, Rfl: 0 .  furosemide (LASIX) 40 MG tablet, Take 1 tablet (40 mg total) by mouth as directed., Disp: 90 tablet, Rfl: 0 .  HYDROcodone-acetaminophen (NORCO/VICODIN) 5-325 MG tablet, Take by mouth 2 in AM 1 at midday and 2 at bedtime for chronic knee pain OK to fill 30 days after last prescription, Disp: 150 tablet, Rfl: 0 .  isosorbide mononitrate (ISMO) 20 MG tablet, TAKE ONE TABLET BY MOUTH TWICE DAILY, Disp: 180 tablet, Rfl: 3 .  loratadine (CLARITIN) 10 MG tablet, Take 1 tablet (10 mg total) by mouth daily., Disp: 30 tablet, Rfl: 11 .  metFORMIN (GLUCOPHAGE) 1000 MG tablet, TAKE ONE TABLET BY MOUTH TWICE DAILY WITH FOOD, Disp: 180 tablet, Rfl: 6 .  nitroGLYCERIN (NITROSTAT) 0.4 MG SL tablet, Place 0.4 mg  under the tongue as needed., Disp: , Rfl:  .  rosuvastatin (CRESTOR) 10 MG tablet, Take 1 tablet (10 mg total) by mouth daily. Replaces lipitor, Disp: 90 tablet, Rfl: 3 .  sacubitril-valsartan (ENTRESTO) 49-51 MG, Take 1 tablet by mouth 2 (two) times daily., Disp: , Rfl:  .  spironolactone (ALDACTONE) 25 MG tablet, Take 25 mg by mouth 2 (two) times daily. , Disp: , Rfl:    Allergies  Allergen Reactions  . Bee Pollen Anaphylaxis, Itching and Swelling  . Nsaids     CKD and CHF    Past Medical History:  Diagnosis Date  . Arthritis   . Cataracts, bilateral    right  . Congestive heart failure ([place])    a. s/p MDT single chamber ICD   . Diabetes mellitus    does not check blood sugars at home  . Diverticulosis   . Glaucoma    bilateral  . H/O TIA (transient ischemic attack) and stroke   . Hyperlipidemia   . Hypertension   . Obesity      Past Surgical History:  Procedure Laterality Date  . CARDIAC CATHETERIZATION  10/24/2003   EF of 45-50%  . CARDIAC DEFIBRILLATOR PLACEMENT  2008  . CATARACT EXTRACTION  2012   right  . EP IMPLANTABLE DEVICE N/A 05/30/2015   Procedure: ICD Generator Changeout;  Surgeon: [name], MD;  Location: [facility];  Service: Cardiovascular;  Laterality: N/A;  . KNEE SURGERY Right 2000  . SHOULDER SURGERY  2012   left    Family History  Problem Relation Age of Onset  . Colon cancer Father   . Esophageal cancer Neg Hx   . Rectal cancer Neg Hx   . Stomach cancer Neg Hx     Social History   Tobacco Use  . Smoking status: Never Smoker  . Smokeless tobacco: Never Used  Substance Use Topics  . Alcohol use: No  . Drug use: No    ROS   Objective:   Vitals: BP 136/76 (BP Location: Left Arm)   Pulse (!) 110   Temp 99 F (37.2 C) (Oral)   Resp 20   SpO2 97%   Physical Exam Constitutional:      General: He is not in acute distress.    Appearance: Normal appearance. He is well-developed. He is obese. He is not  ill-appearing, toxic-appearing or diaphoretic.  HENT:     Head: Normocephalic and atraumatic.     Right Ear: External ear normal.     Left Ear: External ear normal.     Nose: Nose normal.     Mouth/Throat:     Mouth: Mucous membranes are moist.     Pharynx: Oropharynx is clear.  Eyes:     General: No scleral icterus.    Extraocular Movements: Extraocular movements intact.     Pupils: Pupils are equal, round, and reactive to light.  Cardiovascular:     Rate and Rhythm: Normal rate and regular rhythm.     Heart sounds: Normal heart sounds. No murmur. No friction rub. No gallop.   Pulmonary:     Effort: Pulmonary effort is normal. No respiratory distress.     Breath sounds: No stridor. Decreased breath sounds present. No wheezing, rhonchi or rales.  Neurological:     Mental Status: He is alert and oriented to person, place, and time.  Psychiatric:        Mood and Affect: Mood normal.        Behavior: Behavior normal.        Thought Content: Thought content normal.     DG Chest 2 View  Result Date: 09/09/2019 CLINICAL DATA:  Cough. EXAM: CHEST - 2 VIEW COMPARISON:  Chest x-ray 04/09/2015. FINDINGS: Cardiac pacer with lead tip over the right ventricle in stable position. Cardiomegaly. No pulmonary venous congestion. Low lung volumes. No focal infiltrate. No pleural effusion or pneumothorax. Degenerative change thoracic spine. No acute bony abnormality. IMPRESSION: 1.  Cardiac pacer stable position.  Stable cardiomegaly. 2.  Low lung volumes.  No acute pulmonary disease noted. Electronically Signed   By: Marcello Moores  Register   On: 09/09/2019 13:15   ED ECG REPORT   Date: 09/09/2019  Rate: 104bpm  Rhythm: sinus tachycardia  QRS Axis: normal  Intervals: normal  ST/T Wave abnormalities: normal  Conduction Disutrbances:none  Narrative Interpretation: Sinus tachycardia at 104bpm, occasional pvc. Largely unchanged from previous ecg except his pvc.   Old EKG Reviewed: unchanged  I have  personally reviewed the EKG tracing and agree with the computerized printout as noted.   Assessment and Plan :   PDMP not reviewed this encounter.  1. SOB (shortness of breath)   2. Congestive heart failure, unspecified HF chronicity, unspecified heart failure type ([place])   3. Nonischemic cardiomyopathy ([place])   4. Tachycardia   5. Family history of asthma     Case discussed with his cardiologist, Dr. Terrence Dupont.  He plans  on following up with him early next week.  However given his sinus tachycardia, shortness of breath and negative chest x-ray will have patient's wife take him to the emergency room for chest CT, further rule out of pulmonary embolism.  I deferred a D-dimer test given his multiple chronic conditions.  He is otherwise to maintain his regular medications.  Patient's wife is agreeable to taking him later today.  Counseled on the nature of untreated shortness of breath, possible pulmonary embolism. Counseled patient on potential for adverse effects with medications prescribed/recommended today, ER and return-to-clinic precautions discussed, patient verbalized understanding.    Jaynee Eagles, PA-C 09/09/19 1355

## 2019-09-09 NOTE — Discharge Instructions (Addendum)
Please report to the ER now to rule out a pulmonary embolism through a chest CT scan. This is a blood clot and can be life threatening. Please take him to the ER for this asap.

## 2019-09-10 ENCOUNTER — Other Ambulatory Visit: Payer: Self-pay

## 2019-09-10 ENCOUNTER — Emergency Department (HOSPITAL_COMMUNITY): Payer: Medicare Other

## 2019-09-10 ENCOUNTER — Emergency Department (HOSPITAL_COMMUNITY)
Admission: EM | Admit: 2019-09-10 | Discharge: 2019-09-10 | Disposition: A | Discharge status: Home / Self Care | Payer: Medicare Other | Source: Home / Self Care

## 2019-09-10 ENCOUNTER — Encounter (HOSPITAL_COMMUNITY): Payer: Self-pay | Admitting: Emergency Medicine

## 2019-09-10 ENCOUNTER — Inpatient Hospital Stay (HOSPITAL_COMMUNITY): Payer: Medicare Other

## 2019-09-10 ENCOUNTER — Inpatient Hospital Stay (HOSPITAL_COMMUNITY)
Admission: EM | Admit: 2019-09-10 | Discharge: 2019-09-17 | DRG: 189 | Disposition: A | Discharge status: Home / Self Care | Payer: Medicare Other | Attending: Internal Medicine | Admitting: Internal Medicine

## 2019-09-10 ENCOUNTER — Encounter (HOSPITAL_COMMUNITY): Payer: Self-pay | Admitting: Internal Medicine

## 2019-09-10 DIAGNOSIS — Z7984 Long term (current) use of oral hypoglycemic drugs: Secondary | ICD-10-CM | POA: Diagnosis not present

## 2019-09-10 DIAGNOSIS — R0602 Shortness of breath: Secondary | ICD-10-CM | POA: Diagnosis not present

## 2019-09-10 DIAGNOSIS — E785 Hyperlipidemia, unspecified: Secondary | ICD-10-CM | POA: Diagnosis present

## 2019-09-10 DIAGNOSIS — K219 Gastro-esophageal reflux disease without esophagitis: Secondary | ICD-10-CM | POA: Diagnosis present

## 2019-09-10 DIAGNOSIS — H409 Unspecified glaucoma: Secondary | ICD-10-CM | POA: Diagnosis present

## 2019-09-10 DIAGNOSIS — R0609 Other forms of dyspnea: Secondary | ICD-10-CM | POA: Diagnosis present

## 2019-09-10 DIAGNOSIS — R0600 Dyspnea, unspecified: Secondary | ICD-10-CM

## 2019-09-10 DIAGNOSIS — Z7982 Long term (current) use of aspirin: Secondary | ICD-10-CM | POA: Diagnosis not present

## 2019-09-10 DIAGNOSIS — E662 Morbid (severe) obesity with alveolar hypoventilation: Secondary | ICD-10-CM | POA: Diagnosis present

## 2019-09-10 DIAGNOSIS — I480 Paroxysmal atrial fibrillation: Secondary | ICD-10-CM | POA: Diagnosis present

## 2019-09-10 DIAGNOSIS — Z95810 Presence of automatic (implantable) cardiac defibrillator: Secondary | ICD-10-CM | POA: Diagnosis not present

## 2019-09-10 DIAGNOSIS — J9601 Acute respiratory failure with hypoxia: Principal | ICD-10-CM | POA: Diagnosis present

## 2019-09-10 DIAGNOSIS — Z5321 Procedure and treatment not carried out due to patient leaving prior to being seen by health care provider: Secondary | ICD-10-CM | POA: Insufficient documentation

## 2019-09-10 DIAGNOSIS — I10 Essential (primary) hypertension: Secondary | ICD-10-CM | POA: Diagnosis not present

## 2019-09-10 DIAGNOSIS — Z886 Allergy status to analgesic agent status: Secondary | ICD-10-CM

## 2019-09-10 DIAGNOSIS — N179 Acute kidney failure, unspecified: Secondary | ICD-10-CM | POA: Diagnosis present

## 2019-09-10 DIAGNOSIS — I5022 Chronic systolic (congestive) heart failure: Secondary | ICD-10-CM | POA: Diagnosis not present

## 2019-09-10 DIAGNOSIS — N183 Chronic kidney disease, stage 3 unspecified: Secondary | ICD-10-CM | POA: Diagnosis present

## 2019-09-10 DIAGNOSIS — I130 Hypertensive heart and chronic kidney disease with heart failure and stage 1 through stage 4 chronic kidney disease, or unspecified chronic kidney disease: Secondary | ICD-10-CM | POA: Diagnosis present

## 2019-09-10 DIAGNOSIS — Z79899 Other long term (current) drug therapy: Secondary | ICD-10-CM

## 2019-09-10 DIAGNOSIS — T380X5A Adverse effect of glucocorticoids and synthetic analogues, initial encounter: Secondary | ICD-10-CM | POA: Diagnosis not present

## 2019-09-10 DIAGNOSIS — M179 Osteoarthritis of knee, unspecified: Secondary | ICD-10-CM | POA: Diagnosis present

## 2019-09-10 DIAGNOSIS — E1122 Type 2 diabetes mellitus with diabetic chronic kidney disease: Secondary | ICD-10-CM | POA: Diagnosis present

## 2019-09-10 DIAGNOSIS — R05 Cough: Secondary | ICD-10-CM | POA: Insufficient documentation

## 2019-09-10 DIAGNOSIS — R140 Abdominal distension (gaseous): Secondary | ICD-10-CM | POA: Diagnosis not present

## 2019-09-10 DIAGNOSIS — E872 Acidosis: Secondary | ICD-10-CM | POA: Diagnosis present

## 2019-09-10 DIAGNOSIS — J209 Acute bronchitis, unspecified: Secondary | ICD-10-CM | POA: Diagnosis present

## 2019-09-10 DIAGNOSIS — N184 Chronic kidney disease, stage 4 (severe): Secondary | ICD-10-CM | POA: Diagnosis present

## 2019-09-10 DIAGNOSIS — Z20822 Contact with and (suspected) exposure to covid-19: Secondary | ICD-10-CM | POA: Diagnosis present

## 2019-09-10 DIAGNOSIS — Z8673 Personal history of transient ischemic attack (TIA), and cerebral infarction without residual deficits: Secondary | ICD-10-CM

## 2019-09-10 DIAGNOSIS — J9600 Acute respiratory failure, unspecified whether with hypoxia or hypercapnia: Secondary | ICD-10-CM | POA: Diagnosis present

## 2019-09-10 DIAGNOSIS — M1710 Unilateral primary osteoarthritis, unspecified knee: Secondary | ICD-10-CM | POA: Diagnosis present

## 2019-09-10 DIAGNOSIS — E1165 Type 2 diabetes mellitus with hyperglycemia: Secondary | ICD-10-CM | POA: Diagnosis present

## 2019-09-10 DIAGNOSIS — Z6841 Body Mass Index (BMI) 40.0 and over, adult: Secondary | ICD-10-CM | POA: Diagnosis not present

## 2019-09-10 DIAGNOSIS — J9690 Respiratory failure, unspecified, unspecified whether with hypoxia or hypercapnia: Secondary | ICD-10-CM | POA: Diagnosis not present

## 2019-09-10 DIAGNOSIS — E1121 Type 2 diabetes mellitus with diabetic nephropathy: Secondary | ICD-10-CM | POA: Diagnosis not present

## 2019-09-10 DIAGNOSIS — M170 Bilateral primary osteoarthritis of knee: Secondary | ICD-10-CM | POA: Diagnosis not present

## 2019-09-10 DIAGNOSIS — N1831 Chronic kidney disease, stage 3a: Secondary | ICD-10-CM | POA: Diagnosis not present

## 2019-09-10 DIAGNOSIS — I4891 Unspecified atrial fibrillation: Secondary | ICD-10-CM | POA: Diagnosis not present

## 2019-09-10 DIAGNOSIS — I428 Other cardiomyopathies: Secondary | ICD-10-CM | POA: Diagnosis present

## 2019-09-10 DIAGNOSIS — R062 Wheezing: Secondary | ICD-10-CM | POA: Diagnosis not present

## 2019-09-10 DIAGNOSIS — Z825 Family history of asthma and other chronic lower respiratory diseases: Secondary | ICD-10-CM

## 2019-09-10 LAB — CBC
HCT: 39.9 % (ref 39.0–52.0)
HCT: 40.9 % (ref 39.0–52.0)
Hemoglobin: 13 g/dL (ref 13.0–17.0)
Hemoglobin: 13.2 g/dL (ref 13.0–17.0)
MCH: 28.8 pg (ref 26.0–34.0)
MCH: 29.3 pg (ref 26.0–34.0)
MCHC: 32.6 g/dL (ref 30.0–36.0)
MCHC: 32.3 g/dL (ref 30.0–36.0)
MCV: 88.5 fL (ref 80.0–100.0)
MCV: 90.9 fL (ref 80.0–100.0)
Platelets: 246 10*3/uL (ref 150–400)
Platelets: 231 10*3/uL (ref 150–400)
RBC: 4.51 MIL/uL (ref 4.22–5.81)
RBC: 4.5 MIL/uL (ref 4.22–5.81)
RDW: 13.7 % (ref 11.5–15.5)
RDW: 13.9 % (ref 11.5–15.5)
WBC: 6.8 10*3/uL (ref 4.0–10.5)
WBC: 7.7 10*3/uL (ref 4.0–10.5)
nRBC: 0 % (ref 0.0–0.2)
nRBC: 0 % (ref 0.0–0.2)

## 2019-09-10 LAB — TROPONIN I (HIGH SENSITIVITY)
Troponin I (High Sensitivity): 38 ng/L — ABNORMAL HIGH (ref <18)
Troponin I (High Sensitivity): 32 ng/L — ABNORMAL HIGH (ref <18)

## 2019-09-10 LAB — CREATININE, SERUM
Creatinine, Ser: 3.75 mg/dL — ABNORMAL HIGH (ref 0.61–1.24)
GFR calc Af Amer: 19 mL/min — ABNORMAL LOW (ref >60)
GFR calc non Af Amer: 16 mL/min — ABNORMAL LOW (ref >60)

## 2019-09-10 LAB — BASIC METABOLIC PANEL
Anion gap: 13 (ref 5–15)
BUN: 37 mg/dL — ABNORMAL HIGH (ref 6–20)
CO2: 22 mmol/L (ref 22–32)
Calcium: 9.2 mg/dL (ref 8.9–10.3)
Chloride: 102 mmol/L (ref 98–111)
Creatinine, Ser: 3.16 mg/dL — ABNORMAL HIGH (ref 0.61–1.24)
GFR calc Af Amer: 23 mL/min — ABNORMAL LOW (ref >60)
GFR calc non Af Amer: 20 mL/min — ABNORMAL LOW (ref >60)
Glucose, Bld: 224 mg/dL — ABNORMAL HIGH (ref 70–99)
Potassium: 4.4 mmol/L (ref 3.5–5.1)
Sodium: 137 mmol/L (ref 135–145)

## 2019-09-10 LAB — HIV ANTIBODY (ROUTINE TESTING W REFLEX): HIV Screen 4th Generation wRfx: NONREACTIVE

## 2019-09-10 LAB — HEMOGLOBIN A1C
Hgb A1c MFr Bld: 10.8 % — ABNORMAL HIGH (ref 4.8–5.6)
Mean Plasma Glucose: 263.26 mg/dL

## 2019-09-10 LAB — GLUCOSE, CAPILLARY
Glucose-Capillary: 247 mg/dL — ABNORMAL HIGH (ref 70–99)
Glucose-Capillary: 210 mg/dL — ABNORMAL HIGH (ref 70–99)

## 2019-09-10 LAB — BRAIN NATRIURETIC PEPTIDE: B Natriuretic Peptide: 18.9 pg/mL (ref 0.0–100.0)

## 2019-09-10 LAB — SARS CORONAVIRUS 2 (TAT 6-24 HRS): SARS Coronavirus 2: NEGATIVE

## 2019-09-10 MED ORDER — CYCLOBENZAPRINE HCL 10 MG PO TABS
5.0000 mg | ORAL_TABLET | Freq: Every day | ORAL | Status: DC
  Administered 2019-09-10 – 2019-09-16 (×7): 5 mg via ORAL
  Filled 2019-09-10 (×7): qty 1

## 2019-09-10 MED ORDER — LATANOPROST 0.005 % OP SOLN
1.0000 [drp] | Freq: Every evening | OPHTHALMIC | Status: DC
  Administered 2019-09-10 – 2019-09-16 (×7): 1 [drp] via OPHTHALMIC
  Filled 2019-09-10: qty 2.5

## 2019-09-10 MED ORDER — DM-GUAIFENESIN ER 30-600 MG PO TB12
1.0000 | ORAL_TABLET | Freq: Two times a day (BID) | ORAL | Status: DC
  Administered 2019-09-10 – 2019-09-11 (×2): 1 via ORAL
  Filled 2019-09-10 (×3): qty 1

## 2019-09-10 MED ORDER — SODIUM CHLORIDE 0.9 % IV BOLUS
500.0000 mL | Freq: Once | INTRAVENOUS | Status: AC
  Administered 2019-09-10: 500 mL via INTRAVENOUS

## 2019-09-10 MED ORDER — ALBUTEROL SULFATE HFA 108 (90 BASE) MCG/ACT IN AERS
4.0000 | INHALATION_SPRAY | Freq: Once | RESPIRATORY_TRACT | Status: AC
  Administered 2019-09-10: 4 via RESPIRATORY_TRACT
  Filled 2019-09-10: qty 6.7

## 2019-09-10 MED ORDER — ISOSORBIDE MONONITRATE 20 MG PO TABS
20.0000 mg | ORAL_TABLET | Freq: Two times a day (BID) | ORAL | Status: DC
  Administered 2019-09-10 – 2019-09-17 (×14): 20 mg via ORAL
  Filled 2019-09-10 (×6): qty 2
  Filled 2019-09-10: qty 1
  Filled 2019-09-10 (×4): qty 2
  Filled 2019-09-10: qty 1
  Filled 2019-09-10 (×3): qty 2

## 2019-09-10 MED ORDER — SENNOSIDES-DOCUSATE SODIUM 8.6-50 MG PO TABS: 1.0000 | ORAL_TABLET | Freq: Every evening | ORAL | Status: DC | PRN

## 2019-09-10 MED ORDER — BRIMONIDINE TARTRATE 0.2 % OP SOLN
1.0000 [drp] | Freq: Two times a day (BID) | OPHTHALMIC | Status: DC
  Administered 2019-09-10 – 2019-09-17 (×14): 1 [drp] via OPHTHALMIC
  Filled 2019-09-10: qty 5

## 2019-09-10 MED ORDER — HYDROCODONE-ACETAMINOPHEN 5-325 MG PO TABS: 1.0000 | ORAL_TABLET | Freq: Three times a day (TID) | ORAL | Status: DC | PRN

## 2019-09-10 MED ORDER — SODIUM CHLORIDE 0.9 % IV SOLN: Freq: Once | INTRAVENOUS | Status: AC

## 2019-09-10 MED ORDER — GLIMEPIRIDE 4 MG PO TABS
4.0000 mg | ORAL_TABLET | Freq: Every day | ORAL | Status: DC
  Administered 2019-09-11: 4 mg via ORAL
  Filled 2019-09-10: qty 1

## 2019-09-10 MED ORDER — SODIUM CHLORIDE 0.9 % IV SOLN
2.0000 g | INTRAVENOUS | Status: DC
  Administered 2019-09-10 – 2019-09-11 (×2): 2 g via INTRAVENOUS
  Filled 2019-09-10: qty 20
  Filled 2019-09-10: qty 2

## 2019-09-10 MED ORDER — DORZOLAMIDE HCL-TIMOLOL MAL 2-0.5 % OP SOLN
1.0000 [drp] | Freq: Two times a day (BID) | OPHTHALMIC | Status: DC
  Administered 2019-09-10 – 2019-09-17 (×14): 1 [drp] via OPHTHALMIC
  Filled 2019-09-10: qty 10

## 2019-09-10 MED ORDER — ROSUVASTATIN CALCIUM 10 MG PO TABS
10.0000 mg | ORAL_TABLET | Freq: Every day | ORAL | Status: DC
  Administered 2019-09-11 – 2019-09-17 (×7): 10 mg via ORAL
  Filled 2019-09-10 (×7): qty 1

## 2019-09-10 MED ORDER — SODIUM CHLORIDE 0.9 % IV SOLN
500.0000 mg | INTRAVENOUS | Status: DC
  Administered 2019-09-10 – 2019-09-11 (×2): 500 mg via INTRAVENOUS
  Filled 2019-09-10 (×2): qty 500

## 2019-09-10 MED ORDER — ACETAMINOPHEN 325 MG PO TABS
650.0000 mg | ORAL_TABLET | Freq: Four times a day (QID) | ORAL | Status: DC | PRN
  Administered 2019-09-17: 650 mg via ORAL
  Filled 2019-09-10: qty 2

## 2019-09-10 MED ORDER — SODIUM CHLORIDE 0.9% FLUSH: 3.0000 mL | Freq: Once | INTRAVENOUS | Status: DC

## 2019-09-10 MED ORDER — CARVEDILOL 12.5 MG PO TABS
25.0000 mg | ORAL_TABLET | Freq: Two times a day (BID) | ORAL | Status: DC
  Administered 2019-09-10 – 2019-09-17 (×14): 25 mg via ORAL
  Filled 2019-09-10 (×13): qty 2
  Filled 2019-09-10: qty 4
  Filled 2019-09-10 (×2): qty 2

## 2019-09-10 MED ORDER — ENOXAPARIN SODIUM 40 MG/0.4ML ~~LOC~~ SOLN
40.0000 mg | SUBCUTANEOUS | Status: DC
  Administered 2019-09-10: 40 mg via SUBCUTANEOUS
  Filled 2019-09-10: qty 0.4

## 2019-09-10 MED ORDER — SODIUM CHLORIDE 0.9 % IV BOLUS: 250.0000 mL | Freq: Once | INTRAVENOUS | Status: DC

## 2019-09-10 MED ORDER — TECHNETIUM TO 99M ALBUMIN AGGREGATED
1.5000 | Freq: Once | INTRAVENOUS | Status: AC | PRN
  Administered 2019-09-10: 1.5 via INTRAVENOUS

## 2019-09-10 MED ORDER — ONDANSETRON HCL 4 MG/2ML IJ SOLN
4.0000 mg | Freq: Four times a day (QID) | INTRAMUSCULAR | Status: DC | PRN
  Administered 2019-09-11 (×2): 4 mg via INTRAVENOUS
  Filled 2019-09-10 (×2): qty 2

## 2019-09-10 MED ORDER — INSULIN ASPART 100 UNIT/ML ~~LOC~~ SOLN
0.0000 [IU] | Freq: Three times a day (TID) | SUBCUTANEOUS | Status: DC
  Administered 2019-09-10: 5 [IU] via SUBCUTANEOUS
  Administered 2019-09-11: 8 [IU] via SUBCUTANEOUS
  Administered 2019-09-11 – 2019-09-12 (×3): 3 [IU] via SUBCUTANEOUS
  Administered 2019-09-12: 8 [IU] via SUBCUTANEOUS
  Administered 2019-09-12: 3 [IU] via SUBCUTANEOUS
  Administered 2019-09-13: 11 [IU] via SUBCUTANEOUS
  Administered 2019-09-13 – 2019-09-14 (×3): 8 [IU] via SUBCUTANEOUS
  Administered 2019-09-14: 11 [IU] via SUBCUTANEOUS
  Filled 2019-09-10: qty 0.15

## 2019-09-10 MED ORDER — ACETAMINOPHEN 650 MG RE SUPP: 650.0000 mg | Freq: Four times a day (QID) | RECTAL | Status: DC | PRN

## 2019-09-10 MED ORDER — TECHNETIUM TC 99M DIETHYLENETRIAME-PENTAACETIC ACID: 35.0000 | Freq: Once | INTRAVENOUS | Status: DC | PRN

## 2019-09-10 MED ORDER — INSULIN ASPART 100 UNIT/ML ~~LOC~~ SOLN
0.0000 [IU] | Freq: Every day | SUBCUTANEOUS | Status: DC
  Administered 2019-09-10: 2 [IU] via SUBCUTANEOUS
  Administered 2019-09-12: 3 [IU] via SUBCUTANEOUS
  Administered 2019-09-13 – 2019-09-14 (×2): 4 [IU] via SUBCUTANEOUS
  Administered 2019-09-15 – 2019-09-16 (×2): 3 [IU] via SUBCUTANEOUS
  Filled 2019-09-10: qty 0.05

## 2019-09-10 MED ORDER — ASPIRIN EC 81 MG PO TBEC
81.0000 mg | DELAYED_RELEASE_TABLET | Freq: Every day | ORAL | Status: DC
  Administered 2019-09-11 – 2019-09-12 (×2): 81 mg via ORAL
  Filled 2019-09-10 (×2): qty 1

## 2019-09-10 MED ORDER — ONDANSETRON HCL 4 MG PO TABS: 4.0000 mg | ORAL_TABLET | Freq: Four times a day (QID) | ORAL | Status: DC | PRN

## 2019-09-10 NOTE — ED Notes (Signed)
Pt to VQ scan.

## 2019-09-10 NOTE — ED Provider Notes (Signed)
[facility] DEPT Provider Note   CSN: [id number] Arrival date & time: 09/10/19  0355     History Chief Complaint  Patient presents with  . Shortness of Breath    [name] is a 59 y.o. male with PMHx HTN, HLD, Diabetes, CHF with EF 40-45% (05/01/2015), CKD stage III, AICD who presents to the ED today with complaint of gradual onset, constant, worsening, SOB x 3-4 days. Wife also mentions a dry cough and weight gain. She states he normally weighs around 303 pounds however they weight him today at 319 pounds. Pt feels like his abdomen is more distended. They went to UC yesterday and had a CXR done without signs of pulmonary vascular congestion. COVID test negative - pt has been vaccinated. He was tachycardic at [facility] and was advised to go to the ED for a CTA to rule out PE. Pt went to [facility] earlier today however LWBS and came to [facility] instead. He did have labs drawn at that time (see below). Pt denies recent sick contact. No chest pain. No hx DVT/PE. No recent prolonged travel or immobilization. No hemoptysis. No active malignancy. Pt denies fevers, chills, leg swelling, or any other associated symptoms. He denies feeling his defibrillator activate.   The history is provided by the patient, the spouse and medical records.     CBC Component     Latest Ref Rng & Units 09/10/2019  WBC     4.0 - 10.5 K/uL 6.8  RBC     4.22 - 5.81 MIL/uL 4.51  Hemoglobin     13.0 - 17.0 g/dL 13.0  HCT     39.0 - 52.0 % 39.9  MCV     80.0 - 100.0 fL 88.5  MCH     26.0 - 34.0 pg 28.8  MCHC     30.0 - 36.0 g/dL 32.6  RDW     11.5 - 15.5 % 13.7  Platelets     150 - 400 K/uL 246  MPV     7.5 - 12.5 fL    BMP Component     Latest Ref Rng & Units 09/10/2019  Sodium     135 - 145 mmol/L 137  Potassium     3.5 - 5.1 mmol/L 4.4  Chloride     98 - 111 mmol/L 102  CO2     22 - 32 mmol/L 22  Glucose     70 - 99 mg/dL 224 (H)  BUN     6 - 20 mg/dL 37 (H)  Creatinine      0.61 - 1.24 mg/dL 3.16 (H)  Calcium     8.9 - 10.3 mg/dL 9.2  GFR, Est Non African American     >60 mL/min 20 (L)  GFR, Est African American     >60 mL/min 23 (L)  Anion gap     5 - 15 13      Past Medical History:  Diagnosis Date  . Arthritis   . Cataracts, bilateral    right  . Congestive heart failure ([place])    a. s/p MDT single chamber ICD   . Diabetes mellitus    does not check blood sugars at home  . Diverticulosis   . Glaucoma    bilateral  . H/O TIA (transient ischemic attack) and stroke   . Hyperlipidemia   . Hypertension   . Obesity     Patient Active Problem List   Diagnosis Date Noted  . Acute  respiratory failure ([place]) 09/10/2019  . Cough 09/09/2019  . Paroxysmal VT ([place]) 06/22/2019  . Lumbar strain 09/15/2018  . Testicular pain 05/21/2018  . Trigger finger, acquired 11/20/2017  . Glaucoma 11/07/2015  . NICM (nonischemic cardiomyopathy) ([place]) 05/30/2015  . DOE (dyspnea on exertion) 09/20/2014  . Encounter for chronic pain management 05/17/2014  . Automatic implantable cardioverter-defibrillator in situ 07/15/2013  . One eye blind, one eye low vision 06/15/2013  . Arthritis of knee, degenerative 10/03/2011  . Hyperlipidemia with target low density lipoprotein (LDL) cholesterol less than 100 mg/dL 08/20/2011  . Single implantable cardioverter-defibrillator (ICD) in situ 06/17/2011  . OBESITY, MORBID 05/21/2010  . SHOULDER PAIN, LEFT 08/01/2009  . Obstructive sleep apnea 03/27/2009  . DM (diabetes mellitus), type 2 with renal complications ([place]) 42/70/6237  . HYPERTENSION, BENIGN 02/18/2007  . Chronic systolic heart failure ([place]) 02/18/2007  . RENAL DISEASE, CHRONIC, STAGE III 02/18/2007    Past Surgical History:  Procedure Laterality Date  . CARDIAC CATHETERIZATION  10/24/2003   EF of 45-50%  . CARDIAC DEFIBRILLATOR PLACEMENT  2008  . CATARACT EXTRACTION  2012   right  . EP IMPLANTABLE DEVICE N/A 05/30/2015   Procedure: ICD Generator  Changeout;  Surgeon: [name], MD;  Location: [facility] LAB;  Service: Cardiovascular;  Laterality: N/A;  . KNEE SURGERY Right 2000  . SHOULDER SURGERY  2012   left       Family History  Problem Relation Age of Onset  . Colon cancer Father   . Esophageal cancer Neg Hx   . Rectal cancer Neg Hx   . Stomach cancer Neg Hx     Social History   Tobacco Use  . Smoking status: Never Smoker  . Smokeless tobacco: Never Used  Substance Use Topics  . Alcohol use: No  . Drug use: No    Home Medications Prior to Admission medications   Medication Sig Start Date End Date Taking? Authorizing Provider  aspirin 81 MG tablet Take 81 mg by mouth daily.   Yes [provider]  brimonidine (ALPHAGAN) 0.2 % ophthalmic solution Place 1 drop into both eyes 2 (two) times daily. 07/09/19  Yes [provider]  carvedilol (COREG) 25 MG tablet Take 1 tablet (25 mg total) by mouth 2 (two) times daily with a meal. 02/11/12  Yes Dickie La, MD  Colchicine (MITIGARE) 0.6 MG CAPS Take one by mouth daily for gout Patient taking differently: Take 0.6 mg by mouth daily as needed (gout).  07/19/19  Yes Dickie La, MD  cyclobenzaprine (FLEXERIL) 5 MG tablet Take 1 tablet (5 mg total) by mouth at bedtime. 07/18/19  Yes Dickie La, MD  dorzolamide-timolol (COSOPT) 22.3-6.8 MG/ML ophthalmic solution Place 1 drop into both eyes 2 (two) times daily. 08/12/19  Yes [provider]  EPINEPHrine 0.3 mg/0.3 mL IJ SOAJ injection INJECT (0.105ml) into musle AS NEEDED Patient taking differently: Inject 0.3 mg into the muscle as needed for anaphylaxis.  11/24/18  Yes Dickie La, MD  furosemide (LASIX) 40 MG tablet Take 1 tablet (40 mg total) by mouth as directed. Patient taking differently: Take 40 mg by mouth daily as needed for fluid.  05/20/19  Yes Dickie La, MD  glimepiride (AMARYL) 4 MG tablet Take 4 mg by mouth daily. 08/15/19  Yes [provider]  HYDROcodone-acetaminophen  (NORCO/VICODIN) 5-325 MG tablet Take by mouth 2 in AM 1 at midday and 2 at bedtime for chronic knee pain OK to fill 30  days after last prescription Patient taking differently: Take 1-2 tablets by mouth See admin instructions. Take 2 tablets in the morning, 1 tablet at midday and 2 tablets at bedtime for chronic knee pain. 08/18/19  Yes Dickie La, MD  isosorbide mononitrate (ISMO) 20 MG tablet TAKE ONE TABLET BY MOUTH TWICE DAILY Patient taking differently: Take 20 mg by mouth 2 (two) times daily at 10 AM and 5 PM.  06/24/19  Yes Dickie La, MD  latanoprost (XALATAN) 0.005 % ophthalmic solution Place 1 drop into both eyes every evening. 08/12/19  Yes [provider]  nitroGLYCERIN (NITROSTAT) 0.4 MG SL tablet Place 0.4 mg under the tongue as needed. 01/24/19  Yes [provider]  rosuvastatin (CRESTOR) 10 MG tablet Take 1 tablet (10 mg total) by mouth daily. Replaces lipitor 02/23/19  Yes Dickie La, MD  sacubitril-valsartan (ENTRESTO) 49-51 MG Take 1 tablet by mouth 2 (two) times daily.   Yes [provider]  spironolactone (ALDACTONE) 25 MG tablet Take 25 mg by mouth 2 (two) times daily.    Yes [provider]  benzonatate (TESSALON) 100 MG capsule Take 1-2 capsules (100-200 mg total) by mouth 3 (three) times daily as needed. 09/09/19   Jaynee Eagles, PA-C  lisinopril (PRINIVIL,ZESTRIL) 40 MG tablet Take 40 mg by mouth 2 (two) times daily.    06/17/11  [provider]    Allergies    Bee pollen and Nsaids  Review of Systems   Review of Systems  Constitutional: Negative for chills and fever.  Respiratory: Positive for cough and shortness of breath.   Cardiovascular: Negative for chest pain and leg swelling.  Gastrointestinal: Positive for abdominal distention. Negative for abdominal pain, constipation, diarrhea, nausea and vomiting.  All other systems reviewed and are negative.   Physical Exam Updated Vital Signs BP (!) 82/61 (BP Location: Right  Arm)   Pulse 85   Temp 99 F (37.2 C) (Oral)   Ht 5\' 2"  (1.575 m)   Wt (!) 145.2 kg   SpO2 95%   BMI 58.53 kg/m   Physical Exam Vitals and nursing note reviewed.  Constitutional:      Appearance: He is obese. He is not ill-appearing or diaphoretic.  HENT:     Head: Normocephalic and atraumatic.  Eyes:     Conjunctiva/sclera: Conjunctivae normal.  Cardiovascular:     Rate and Rhythm: Regular rhythm. Tachycardia present.  Pulmonary:     Effort: Pulmonary effort is normal.     Breath sounds: Rales present. No decreased breath sounds, wheezing or rhonchi.     Comments: Actively coughing in room. Able to speak in short sentences. Satting 95% on RA.  Chest:     Chest wall: No tenderness.  Abdominal:     Palpations: Abdomen is soft.     Tenderness: There is no abdominal tenderness. There is no guarding or rebound.  Musculoskeletal:     Cervical back: Neck supple.     Right lower leg: No edema.     Left lower leg: No edema.  Skin:    General: Skin is warm and dry.  Neurological:     Mental Status: He is alert.     ED Results / Procedures / Treatments   Labs (all labs ordered are listed, but only abnormal results are displayed) Labs Reviewed  TROPONIN I (HIGH SENSITIVITY) - Abnormal; Notable for the following components:      Result Value   Troponin I (High Sensitivity) 32 (*)  All other components within normal limits  BRAIN NATRIURETIC PEPTIDE  URINALYSIS, ROUTINE W REFLEX MICROSCOPIC  HIV ANTIBODY (ROUTINE TESTING W REFLEX)  CBC  CREATININE, SERUM  HEMOGLOBIN A1C    EKG EKG Interpretation  Date/Time:  Saturday September 10 2019 08:47:40 EDT Ventricular Rate:  117 PR Interval:    QRS Duration: 90 QT Interval:  329 QTC Calculation: 459 R Axis:   64 Text Interpretation: Atrial fibrillation Low voltage, precordial leads Nonspecific T abnormalities, lateral leads 12 Lead; [place] similar to June 2021 Confirmed by Sherwood Gambler [phone]) on 09/10/2019 8:52:04  AM   Radiology DG Chest 2 View  Result Date: 09/09/2019 CLINICAL DATA:  Cough. EXAM: CHEST - 2 VIEW COMPARISON:  Chest x-ray 04/09/2015. FINDINGS: Cardiac pacer with lead tip over the right ventricle in stable position. Cardiomegaly. No pulmonary venous congestion. Low lung volumes. No focal infiltrate. No pleural effusion or pneumothorax. Degenerative change thoracic spine. No acute bony abnormality. IMPRESSION: 1.  Cardiac pacer stable position.  Stable cardiomegaly. 2.  Low lung volumes.  No acute pulmonary disease noted. Electronically Signed   By: Marcello Moores  Register   On: 09/09/2019 13:15   DG Chest Port 1 View  Result Date: 09/10/2019 CLINICAL DATA:  Coughing and shortness of breath with wheezing for 5 days. EXAM: PORTABLE CHEST 1 VIEW COMPARISON:  09/09/2019 FINDINGS: Study limited by the semi-erect AP technique, low lung volumes and the patient's body habitus. Allowing for the above, the cardiac silhouette is normal in size. Stable left anterior chest wall AICD. No mediastinal or hilar masses. Clear lungs.  No convincing pleural effusion or pneumothorax. Skeletal structures are grossly intact. IMPRESSION: No active disease. Electronically Signed   By: Lajean Manes M.D.   On: 09/10/2019 10:46    Procedures Procedures (including critical care time)  Medications Ordered in ED Medications  enoxaparin (LOVENOX) injection 40 mg (has no administration in time range)  acetaminophen (TYLENOL) tablet 650 mg (has no administration in time range)    Or  acetaminophen (TYLENOL) suppository 650 mg (has no administration in time range)  senna-docusate (Senokot-S) tablet 1 tablet (has no administration in time range)  ondansetron (ZOFRAN) tablet 4 mg (has no administration in time range)    Or  ondansetron (ZOFRAN) injection 4 mg (has no administration in time range)  insulin aspart (novoLOG) injection 0-15 Units (has no administration in time range)  insulin aspart (novoLOG) injection 0-5 Units (has  no administration in time range)  cefTRIAXone (ROCEPHIN) 2 g in sodium chloride 0.9 % 100 mL IVPB (has no administration in time range)  azithromycin (ZITHROMAX) 500 mg in sodium chloride 0.9 % 250 mL IVPB (has no administration in time range)  sodium chloride 0.9 % bolus 500 mL (0 mLs Intravenous Stopped 09/10/19 1138)  albuterol (VENTOLIN HFA) 108 (90 Base) MCG/ACT inhaler 4 puff (4 puffs Inhalation Given 09/10/19 1144)  technetium albumin aggregated (MAA) injection solution 1.5 millicurie (1.5 millicuries Intravenous Contrast Given 09/10/19 1300)    ED Course  I have reviewed the triage vital signs and the nursing notes.  Pertinent labs & imaging results that were available during my care of the patient were reviewed by me and considered in my medical decision making (see chart for details).  Clinical Course as of Sep 10 1350  Sat Sep 10, 2019  1049 Troponin I (High Sensitivity)(!): 32 [MV]  1049 B Natriuretic Peptide: 18.9 [MV]  1101 BP: 104/83 [MV]    Clinical Course User Index [MV] Eustaquio Maize, PA-C   MDM Rules/Calculators/A&P  59 year old male with history of CHF who presents the ED today with complaint of shortness of breath, productive cough for the past 2 to 3 days.  Seen at urgent care yesterday with negative chest x-ray and told to go to the ED for further evaluation with concern for possible PE given tachycardia and in the low 110s.  Patient was at any history of DVT/PE.  No risk factors besides tachycardia yesterday however he is nontachycardic today.  He went to [facility] ED earlier today and had lab work done however left prior to being seen.  Does appear patient has an AKI today with creatinine of 3.16 - unfortunately due to this he will be unable to get a CTA.  Given his weight gain as well as dyspnea on exertion it does seem more likely that he is a CHF exacerbation however he did have a negative chest x-ray yesterday.  Will repeat chest x-ray and  obtain BNP at this time.  Troponin ordered however patient denies any chest pain.  He has noted to be tensive today with a blood pressure of 84/65.  With AKI and low blood pressure question if his symptoms are related to over diuresis -it does appear per telemedicine visit yesterday prior to urgent care visit that patient takes his Lasix as needed.  He may have taken too much.  Will provide a small amount of fluid and reassess, last EF 40 to 45% in 2017.  Discussed case with attending physician Dr. Regenia Skeeter who agrees with plan.  Lab Results  Component Value Date   CREATININE 3.16 (H) 09/10/2019   CREATININE 1.95 (H) 06/28/2019   CREATININE 1.66 (H) 06/22/2019   Chest x-ray today clear.  BNP 18.9.  Troponin of 32, patient did have a troponin at [facility], ED today prior to leaving, it was 38.   Repeat blood pressure - 104/83.  Does not appear to be in CHF exacerbation. Dr. Regenia Skeeter evaluated patient - concern for possible bronchitis, albuterol ordered. Regardless feel pt needs to be admitted for further work up with VQ scan to definitively rule out PE. Wife and pt do not want to go to [facility] for admission - they are followed by family practice. Will consult hospitalist here for admission.   Discussed case with Dr. Eustaquio Maize Hospitalist who will come evaluate patient for admission.   This note was prepared using Dragon voice recognition software and may include unintentional dictation errors due to the inherent limitations of voice recognition software.   Final Clinical Impression(s) / ED Diagnoses Final diagnoses:  AKI (acute kidney injury) ([place])  Shortness of breath    Rx / DC Orders ED Discharge Orders    None       Eustaquio Maize, PA-C 09/10/19 1352    Sherwood Gambler, MD 09/10/19 1414

## 2019-09-10 NOTE — ED Notes (Signed)
pts wife took pt outside and stated she was waiting with him outside.

## 2019-09-10 NOTE — ED Triage Notes (Signed)
Patient accompanied to ED by wife. Wife states patient has history of CHF, persistent shobr x3 days. Patient went to UC yesterday, UC directed patient to come to ED. Wife states UC did chest xray and said there is "no fluid around his heart" and that patient "may have a blood clot" - patient has dry non productive cough in ED, bp 84/57, o2 sat 92%, heart rate 60 - wife states patient is blind.

## 2019-09-10 NOTE — Discharge Instructions (Addendum)

## 2019-09-10 NOTE — ED Triage Notes (Signed)
Pt. Stated, I might have some blood clots somebody told me.

## 2019-09-10 NOTE — ED Notes (Signed)
Daughter pick pt up and told staff that she will take him to [facility]

## 2019-09-10 NOTE — ED Notes (Signed)
Pt able to ambulate with assistance and cane with spO2 staying above 92%

## 2019-09-10 NOTE — H&P (Signed)
History and Physical    [name] PJK:[id number] DOB: [birth year] DOA: 09/10/2019  PCP: Dickie La, MD   Chief Complaint: Shortness of breath  HPI: [name] is a 59 y.o. male with medical history significant of hypertension, hyperlipidemia, history of TIA and CVA, CKD 3 a, chronic blindness in the setting of cataracts, diabetes mellitus type 2 non-insulin-dependent, morbid obesity, heart failure systolic with reduced ejection fraction that is post ICD 2008.  Patient presents with somewhat subacute episode of shortness of breath.  Approximately 5 days ago patient had worsening shortness of breath assumed this was a normal heart failure exacerbation and subsequently cut p.o. intake and p.o. fluids down drastically and continue taking diuretics.  Over the following 48 hours patient's respiratory status continued to worsen, with increasing cough.  Patient was evaluated at urgent care some 48 hours ago and there was discussion about concern over pulmonary embolism and was subsequently sent to [facility] for evaluation but due to ED wait time patient left the facility and came to [facility] facility where he is being evaluated currently.  ED Course: In the ED patient had a repeat chest x-ray, somewhat limited due to body habitus, labs remarkable for elevated creatinine from baseline.  Patient's BNP is within normal limits, troponin slightly elevated at 30s otherwise without white count fever but moderately tachypneic in the setting of respiratory distress but not overtly hypoxic even with ambulation.  Review of Systems: As per HPI admits to cough, shortness of breath, dyspnea with exertion denies orthopnea, headache, fever, chills, nausea, vomiting, diarrhea, constipation, chest pain.   Assessment/Plan Principal Problem:   Acute respiratory failure (HCC) Active Problems:   HYPERTENSION, BENIGN   Chronic systolic heart failure (HCC)   RENAL DISEASE, CHRONIC, STAGE III   DM (diabetes  mellitus), type 2 with renal complications (HCC)   Arthritis of knee, degenerative   Automatic implantable cardioverter-defibrillator in situ   DOE (dyspnea on exertion)   Acute respiratory without hypoxia failure likely in the setting of pneumonia versus bronchitis No formal history of COPD exacerbation but does have risk factors including smoke exposure from wife -As above patient reports worsening respiratory status despite fluid restriction and ongoing diuresis -Patient remains without hypoxia, ambulatory oxygen screening today in the ED patient sats remained above 92% on room air with ambulation -CT chest without contrast pending to rule out atelectasis/infiltrate (able to tolerate contrast due to AKI as below) to rule out atelectasis or infectious process -VQ scan pending to rule out PE as previously discussed in the outpatient setting -Initiate azithromycin and ceftriaxone for possible bronchiectasis, bronchitis or underlying pneumonia -would also cover for possible COPD exacerbation although patient has no formal diagnosis of COPD -Cannot rule out concurrent obesity hypoventilation syndrome although this would be less acute  AKI with history of CKD 3a in the setting of poor p.o. intake and diuresis -Continue IV fluids, follow judiciously given history of heart failure  Heart failure, systolic with reduced ejection fraction, status post ICD not in acute exacerbation -Patient appears dry if anything, continue IV fluids as above holding diuretics for now -Follows with Dr. Caryl Comes for ICD evaluation, generator change 2017 per report  Hypertension - Continue home medications, hold diuretics, Entresto, spironolactone given above  Non-insulin-dependent diabetes type 2, uncontrolled -Continue sliding scale insulin Lab Results  Component Value Date   HGBA1C 8.0 (A) 06/22/2019    DVT prophylaxis: Lovenox Code Status: Full Family Communication: Wife updated over the phone Status is:  Inpatient  Dispo: The patient is from: Home              Anticipated d/c is to: Home              Anticipated d/c date is: 48 to 72 hours pending clinical course              Patient currently not medically stable for discharge given ongoing respiratory distress without hypoxia, need for IV fluids in the setting of AKI, further imaging and evaluation in the setting of acute respiratory distress without clear etiology at this time.  Consultants:   None  Procedures:   None planned   Past Medical History:  Diagnosis Date  . Arthritis   . Cataracts, bilateral    right  . Congestive heart failure ([place])    a. s/p MDT single chamber ICD   . Diabetes mellitus    does not check blood sugars at home  . Diverticulosis   . Glaucoma    bilateral  . H/O TIA (transient ischemic attack) and stroke   . Hyperlipidemia   . Hypertension   . Obesity     Past Surgical History:  Procedure Laterality Date  . CARDIAC CATHETERIZATION  10/24/2003   EF of 45-50%  . CARDIAC DEFIBRILLATOR PLACEMENT  2008  . CATARACT EXTRACTION  2012   right  . EP IMPLANTABLE DEVICE N/A 05/30/2015   Procedure: ICD Generator Changeout;  Surgeon: [name], MD;  Location: [place] [facility];  Service: Cardiovascular;  Laterality: N/A;  . KNEE SURGERY Right 2000  . SHOULDER SURGERY  2012   left     reports that he has never smoked. He has never used smokeless tobacco. He reports that he does not drink alcohol or use drugs.  Allergies  Allergen Reactions  . Bee Pollen Anaphylaxis, Itching and Swelling  . Nsaids     CKD and CHF    Family History  Problem Relation Age of Onset  . Colon cancer Father   . Esophageal cancer Neg Hx   . Rectal cancer Neg Hx   . Stomach cancer Neg Hx     Prior to Admission medications   Medication Sig Start Date End Date Taking? Authorizing Provider  aspirin 81 MG tablet Take 81 mg by mouth daily.   Yes [provider]  carvedilol (COREG) 25 MG tablet Take 1  tablet (25 mg total) by mouth 2 (two) times daily with a meal. 02/11/12  Yes Dickie La, MD  Colchicine (MITIGARE) 0.6 MG CAPS Take one by mouth daily for gout Patient taking differently: Take 0.6 mg by mouth daily as needed (gout).  07/19/19  Yes Dickie La, MD  cyclobenzaprine (FLEXERIL) 5 MG tablet Take 1 tablet (5 mg total) by mouth at bedtime. 07/18/19  Yes Dickie La, MD  EPINEPHrine 0.3 mg/0.3 mL IJ SOAJ injection INJECT (0.30ml) into musle AS NEEDED 11/24/18  Yes Dickie La, MD  furosemide (LASIX) 40 MG tablet Take 1 tablet (40 mg total) by mouth as directed. Patient taking differently: Take 40 mg by mouth daily as needed for fluid.  05/20/19  Yes Dickie La, MD  glimepiride (AMARYL) 4 MG tablet Take 4 mg by mouth daily. 08/15/19  Yes [provider]  HYDROcodone-acetaminophen (NORCO/VICODIN) 5-325 MG tablet Take by mouth 2 in AM 1 at midday and 2 at bedtime for chronic knee pain OK to fill 30 days after last prescription 08/18/19  Yes Dickie La, MD  isosorbide mononitrate (ISMO) 20 MG tablet TAKE ONE TABLET BY MOUTH TWICE DAILY 06/24/19  Yes Dickie La, MD  latanoprost (XALATAN) 0.005 % ophthalmic solution Place 1 drop into both eyes every evening. 08/12/19  Yes [provider]  nitroGLYCERIN (NITROSTAT) 0.4 MG SL tablet Place 0.4 mg under the tongue as needed. 01/24/19  Yes [provider]  rosuvastatin (CRESTOR) 10 MG tablet Take 1 tablet (10 mg total) by mouth daily. Replaces lipitor 02/23/19  Yes Dickie La, MD  sacubitril-valsartan (ENTRESTO) 49-51 MG Take 1 tablet by mouth 2 (two) times daily.   Yes [provider]  spironolactone (ALDACTONE) 25 MG tablet Take 25 mg by mouth 2 (two) times daily.    Yes [provider]  benzonatate (TESSALON) 100 MG capsule Take 1-2 capsules (100-200 mg total) by mouth 3 (three) times daily as needed. 09/09/19   Jaynee Eagles, PA-C  brimonidine (ALPHAGAN) 0.2 % ophthalmic solution Place 1 drop into both  eyes 2 (two) times daily. 07/09/19   [provider]  dorzolamide-timolol (COSOPT) 22.3-6.8 MG/ML ophthalmic solution Place 1 drop into both eyes 2 (two) times daily. 08/12/19   [provider]  metFORMIN (GLUCOPHAGE) 1000 MG tablet TAKE ONE TABLET BY MOUTH TWICE DAILY WITH FOOD 05/20/19   Dickie La, MD  lisinopril (PRINIVIL,ZESTRIL) 40 MG tablet Take 40 mg by mouth 2 (two) times daily.    06/17/11  [provider]    Physical Exam: Vitals:   09/10/19 1002 09/10/19 1045 09/10/19 1100 09/10/19 1145  BP: (!) 84/65 104/83 (!) 100/50 110/62  Pulse: 90 92 86 87  Resp: 19 (!) 27 18 (!) 24  Temp:      TempSrc:      SpO2: 94% 94% 97% 96%  Weight:      Height:        Constitutional: NAD, calm, comfortable Vitals:   09/10/19 1002 09/10/19 1045 09/10/19 1100 09/10/19 1145  BP: (!) 84/65 104/83 (!) 100/50 110/62  Pulse: 90 92 86 87  Resp: 19 (!) 27 18 (!) 24  Temp:      TempSrc:      SpO2: 94% 94% 97% 96%  Weight:      Height:       General:  Pleasantly resting in bed, No acute distress. HEENT:  Normocephalic atraumatic.  Sclerae nonicteric, noninjected.  Extraocular movements intact bilaterally. Neck:  Without mass or deformity.  Trachea is midline. Lungs: Bilateral inspiratory and expiratory wheeze worse at apices without overt rales or rhonchi. Heart:  Regular rate and rhythm.  Without murmurs, rubs, or gallops. Abdomen:  Soft, nontender, obese, nondistended.  Without guarding or rebound. Extremities: Without cyanosis, clubbing, edema, or obvious deformity. Vascular:  Dorsalis pedis and posterior tibial pulses palpable bilaterally. Skin:  Warm and dry, no erythema, no ulcerations.  Labs on Admission: I have personally reviewed following labs and imaging studies  CBC: Recent Labs  Lab 09/10/19 0834  WBC 6.8  HGB 13.0  HCT 39.9  MCV 88.5  PLT 741   Basic Metabolic Panel: Recent Labs  Lab 09/10/19 0834  NA 137  K 4.4  CL 102  CO2 22  GLUCOSE  224*  BUN 37*  CREATININE 3.16*  CALCIUM 9.2   GFR: Estimated Creatinine Clearance: 31.9 mL/min (A) (by C-G formula based on SCr of 3.16 mg/dL (H)). Liver Function Tests: No results for input(s): AST, ALT, ALKPHOS, BILITOT, PROT, ALBUMIN in the last 168 hours. No results for input(s): LIPASE, AMYLASE in the last  168 hours. No results for input(s): AMMONIA in the last 168 hours. Coagulation Profile: No results for input(s): INR, PROTIME in the last 168 hours. Cardiac Enzymes: No results for input(s): CKTOTAL, CKMB, CKMBINDEX, TROPONINI in the last 168 hours. BNP (last 3 results) No results for input(s): PROBNP in the last 8760 hours. HbA1C: No results for input(s): HGBA1C in the last 72 hours. CBG: No results for input(s): GLUCAP in the last 168 hours. Lipid Profile: No results for input(s): CHOL, HDL, LDLCALC, TRIG, CHOLHDL, LDLDIRECT in the last 72 hours. Thyroid Function Tests: No results for input(s): TSH, T4TOTAL, FREET4, T3FREE, THYROIDAB in the last 72 hours. Anemia Panel: No results for input(s): VITAMINB12, FOLATE, FERRITIN, TIBC, IRON, RETICCTPCT in the last 72 hours. Urine analysis:    Component Value Date/Time   COLORURINE YELLOW 07/03/2010 1022   APPEARANCEUR CLEAR 07/03/2010 1022   LABSPEC 1.011 07/03/2010 1022   PHURINE 5.5 07/03/2010 1022   GLUCOSEU NEGATIVE 07/03/2010 1022   HGBUR NEGATIVE 07/03/2010 1022   BILIRUBINUR NEG 04/01/2012 1020   KETONESUR NEGATIVE 07/03/2010 1022   PROTEINUR NEG 04/01/2012 1020   PROTEINUR NEGATIVE 07/03/2010 1022   UROBILINOGEN 0.2 04/01/2012 1020   UROBILINOGEN 0.2 07/03/2010 1022   NITRITE NEG 04/01/2012 1020   NITRITE NEGATIVE 07/03/2010 1022   LEUKOCYTESUR Negative 04/01/2012 1020    Radiological Exams on Admission: DG Chest 2 View  Result Date: 09/09/2019 CLINICAL DATA:  Cough. EXAM: CHEST - 2 VIEW COMPARISON:  Chest x-ray 04/09/2015. FINDINGS: Cardiac pacer with lead tip over the right ventricle in stable position.  Cardiomegaly. No pulmonary venous congestion. Low lung volumes. No focal infiltrate. No pleural effusion or pneumothorax. Degenerative change thoracic spine. No acute bony abnormality. IMPRESSION: 1.  Cardiac pacer stable position.  Stable cardiomegaly. 2.  Low lung volumes.  No acute pulmonary disease noted. Electronically Signed   By: Marcello Moores  Register   On: 09/09/2019 13:15   CT CHEST WO CONTRAST  Result Date: 09/10/2019 CLINICAL DATA:  Respiratory failure. History of CHF. Shortness of breath for 3 days. EXAM: CT CHEST WITHOUT CONTRAST TECHNIQUE: Multidetector CT imaging of the chest was performed following the standard protocol without IV contrast. COMPARISON:  10/29/2004, chest x-ray on 09/10/2019 FINDINGS: Cardiovascular: The patient has a LEFT-sided AICD with lead to the RIGHT ventricle. There are focal calcifications of the coronary arteries. Minimal atherosclerosis of the thoracic aorta, not associated with aneurysm. Heart size is normal. No pericardial effusion. Normal noncontrast appearance of the pulmonary arteries. Mediastinum/Nodes: The visualized portion of the thyroid gland has a normal appearance. No significant mediastinal, hilar, or axillary adenopathy. Esophagus is normal. Lungs/Pleura: There is minimal tree-in-bud opacity and focal airspace filling within the RIGHT UPPER lobe. Findings are consistent with infectious or inflammatory process. No suspicious pulmonary nodules. No pleural effusions or evidence for pulmonary edema. Upper Abdomen: Diffusely low attenuation of the liver. No focal liver lesion. Gallbladder is present. Adrenal glands are normal in appearance. No acute abnormality. Musculoskeletal: Degenerative changes are seen in the thoracic and lumbar spine. No acute osseous lesions. IMPRESSION: 1. Minimal tree-in-bud opacity and focal airspace filling within the RIGHT UPPER lobe consistent with mild infectious or inflammatory process. 2. Coronary artery disease. 3. Hepatic steatosis.  4. Aortic Atherosclerosis (ICD10-I70.0). Electronically Signed   By: Nolon Nations M.D.   On: 09/10/2019 14:14   NM Pulmonary Perfusion  Result Date: 09/10/2019 CLINICAL DATA:  Shortness of breath for 3 days, nonproductive cough, high clinical suspicion of pulmonary embolism EXAM: NUCLEAR MEDICINE PERFUSION LUNG SCAN TECHNIQUE:  Perfusion images were obtained in multiple projections after intravenous injection of radiopharmaceutical. Ventilation scans: Patient refused mental a shin imaging RADIOPHARMACEUTICALS:  1.5 mCi Tc-61m MAA IV COMPARISON:  Chest radiograph 09/10/2019 FINDINGS: Normal perfusion lung scan. No perfusion defects identified. Photopenic artifact projects over LEFT upper lobe on LAO view from ICD generator. IMPRESSION: Normal perfusion lung scan. Electronically Signed   By: Lavonia Dana M.D.   On: 09/10/2019 14:18   DG Chest Port 1 View  Result Date: 09/10/2019 CLINICAL DATA:  Coughing and shortness of breath with wheezing for 5 days. EXAM: PORTABLE CHEST 1 VIEW COMPARISON:  09/09/2019 FINDINGS: Study limited by the semi-erect AP technique, low lung volumes and the patient's body habitus. Allowing for the above, the cardiac silhouette is normal in size. Stable left anterior chest wall AICD. No mediastinal or hilar masses. Clear lungs.  No convincing pleural effusion or pneumothorax. Skeletal structures are grossly intact. IMPRESSION: No active disease. Electronically Signed   By: Lajean Manes M.D.   On: 09/10/2019 10:46    EKG: Independently reviewed. NSR with occasional PACs.  Little Ishikawa DO [facility] For contact please use secure messenger on Epic  If 7PM-7AM, please contact night-coverage located on [email or web address]   09/10/2019, 2:33 PM

## 2019-09-10 NOTE — ED Triage Notes (Signed)
Pt. Stated, [name] been coughing for 3 weeks, and hard to breathe. I went to UC yesterday and xray was ok and EKG and COVID test was neg. Im just no better.

## 2019-09-11 LAB — BASIC METABOLIC PANEL
Anion gap: 12 (ref 5–15)
BUN: 50 mg/dL — ABNORMAL HIGH (ref 6–20)
CO2: 19 mmol/L — ABNORMAL LOW (ref 22–32)
Calcium: 8.3 mg/dL — ABNORMAL LOW (ref 8.9–10.3)
Chloride: 106 mmol/L (ref 98–111)
Creatinine, Ser: 3.18 mg/dL — ABNORMAL HIGH (ref 0.61–1.24)
GFR calc Af Amer: 23 mL/min — ABNORMAL LOW (ref >60)
GFR calc non Af Amer: 20 mL/min — ABNORMAL LOW (ref >60)
Glucose, Bld: 193 mg/dL — ABNORMAL HIGH (ref 70–99)
Potassium: 4.5 mmol/L (ref 3.5–5.1)
Sodium: 137 mmol/L (ref 135–145)

## 2019-09-11 LAB — CBC
HCT: 34.9 % — ABNORMAL LOW (ref 39.0–52.0)
Hemoglobin: 11.3 g/dL — ABNORMAL LOW (ref 13.0–17.0)
MCH: 28.9 pg (ref 26.0–34.0)
MCHC: 32.4 g/dL (ref 30.0–36.0)
MCV: 89.3 fL (ref 80.0–100.0)
Platelets: 195 10*3/uL (ref 150–400)
RBC: 3.91 MIL/uL — ABNORMAL LOW (ref 4.22–5.81)
RDW: 13.9 % (ref 11.5–15.5)
WBC: 7.6 10*3/uL (ref 4.0–10.5)
nRBC: 0 % (ref 0.0–0.2)

## 2019-09-11 LAB — GLUCOSE, CAPILLARY
Glucose-Capillary: 193 mg/dL — ABNORMAL HIGH (ref 70–99)
Glucose-Capillary: 253 mg/dL — ABNORMAL HIGH (ref 70–99)
Glucose-Capillary: 199 mg/dL — ABNORMAL HIGH (ref 70–99)
Glucose-Capillary: 191 mg/dL — ABNORMAL HIGH (ref 70–99)

## 2019-09-11 MED ORDER — IPRATROPIUM-ALBUTEROL 0.5-2.5 (3) MG/3ML IN SOLN
3.0000 mL | RESPIRATORY_TRACT | Status: DC | PRN
  Filled 2019-09-11: qty 3

## 2019-09-11 MED ORDER — APIXABAN 5 MG PO TABS
5.0000 mg | ORAL_TABLET | Freq: Two times a day (BID) | ORAL | Status: DC
  Administered 2019-09-11 – 2019-09-17 (×12): 5 mg via ORAL
  Filled 2019-09-11 (×12): qty 1

## 2019-09-11 MED ORDER — GUAIFENESIN-DM 100-10 MG/5ML PO SYRP
5.0000 mL | ORAL_SOLUTION | ORAL | Status: DC | PRN
  Administered 2019-09-11 – 2019-09-17 (×5): 5 mL via ORAL
  Filled 2019-09-11 (×5): qty 10

## 2019-09-11 MED ORDER — FUROSEMIDE 10 MG/ML IJ SOLN
60.0000 mg | Freq: Two times a day (BID) | INTRAMUSCULAR | Status: DC
  Administered 2019-09-11 – 2019-09-12 (×2): 60 mg via INTRAVENOUS
  Filled 2019-09-11 (×2): qty 6

## 2019-09-11 MED ORDER — IPRATROPIUM-ALBUTEROL 0.5-2.5 (3) MG/3ML IN SOLN
3.0000 mL | Freq: Four times a day (QID) | RESPIRATORY_TRACT | Status: DC
  Administered 2019-09-11 – 2019-09-12 (×3): 3 mL via RESPIRATORY_TRACT
  Filled 2019-09-11 (×3): qty 3

## 2019-09-11 NOTE — Progress Notes (Addendum)
PROGRESS NOTE    [name]  ZDG:[id number] DOB: 1960/11/10 DOA: 09/10/2019 PCP: Dickie La, MD    Brief Narrative:  Patient admitted to the hospital with a working diagnosis of acute hypoxic respiratory failure due to pneumonia, COPD exacerbation.  59 year old male who presented with dyspnea. He does have significant past medical history for hypertension, dyslipidemia, chronic kidney disease stage III, history of CVA, obesity class III, systolic heart failure and type 2 diabetes mellitus. Patient reported 5 days of worsening dyspnea, more severe over the last 48 hours, it has been refractive to oral diuretic therapy as an outpatient. Positive orthopnea and PND, no lower extremity edema.  On his initial physical examination blood pressure 84/65, 100/50, heart rate 92, respiratory rate 24-27, oxygen saturation 94% on supplemental oxygen.  His lungs had expiratory wheezing, no rhonchi, heart S1-S2, present rhythmic, soft abdomen, no significant lower extremity edema. Sodium 137, potassium 4.4, chloride 102, bicarb 22, glucose 224, BUN 37 creatinine 3.16, white count 6.8 hemoglobin 13.0, hematocrit 39.9 platelets 246.  SARS COVID-19 negative.  Chest radiograph was hypoinflated, large pulmonary artery, hilar vascular congestion with cephalization of vasculature.  Chest CT with faint bilateral groundglass opacities.  V/Q low probability for pulmonary embolism.   #1 EKG 109 bpm, normal axis, normal QTC, sinus rhythm with multiple PACs, no ST segment or T wave changes.  Low voltage  #2 EKG 117 bpm, normal axis, normal QTC, atrial fibrillation, no significant ST segment or T wave changes.  Low voltage.   Assessment & Plan:   Principal Problem:   Acute respiratory failure (HCC) Active Problems:   HYPERTENSION, BENIGN   Chronic systolic heart failure (HCC)   RENAL DISEASE, CHRONIC, STAGE III   DM (diabetes mellitus), type 2 with renal complications (HCC)   Arthritis of knee, degenerative   Automatic implantable cardioverter-defibrillator in situ   DOE (dyspnea on exertion)   1. Acute hypoxic respiratory failure due to acute on chronic systolic heart failure, complicated with paroxysmal atrial fibrillation.  Patient has converted today around noon, back to sinus rhythm, he continue to have dyspnea and wheezing. Chest imaging with poor penetration due to body habitus but may suggest acute cardiogenic pulmonary edema. BNP not accurate in the setting of obesity. Last echocardiogram in the system from 2017 with EF 40 to 45% with diffuse hypokinesis.   Will avoid further IV fluids, patient on high dose of carvedilol 25 mg po bid, if recurrent a fib will plan to increase dose for now will continue close monitoring. CAHDS vasc score 3, high risk for thromboembolic events, will place patient on apixaban renal dose per pharmacy.    Will start diuresis with furosemide 60 mg IV q 12 H to target negative fluid balance. Continue spironolactone and isosorbide. Continue to hold on entresto for now to prevent hypotension. Repeat echocardiogram.   Will add bronchodilator therapy with duoneb.   Patient continue to be at high risk for worsening respiratory failure.   2. AKI on CKD stage 3/ non anion gap metabolic acidosis. Renal function with serum cr at 3,18 with K at 4,5 and serum bicarbonate at 19.   Continue diuresis with furosemide and aldactone, follow up renal panel in am, avoid hypotension or nephrotoxic medications.   3. HTN. Continue carvedilol and isosorbide for now, for blood pressure control.  4. Uncontrolled T2DM Hgb A1c 10.8/ dyslipidemia glucose is 193 fasting, will continue glucose cover and monitoring with insulin sliding scale.   Hold on oral hypoglycemic agents while hospitalized. Continue  with statin therapy.    5. Obesity class 3. Calculated BMI is 58,5. Will need outpatient follow up.   Status is: Inpatient  Remains inpatient appropriate because:IV treatments  appropriate due to intensity of illness or inability to take PO   Dispo: The patient is from: Home              Anticipated d/c is to: Home              Anticipated d/c date is: 3 days              Patient currently is not medically stable to d/c.   DVT prophylaxis: apixaban   Code Status:   full  Family Communication:   I spoke over the phone with the patient's wife about patient's  condition, plan of care, prognosis and all questions were addressed.  *    Subjective: Patient continue to have dyspnea and wheezing, decrease mobility, no nausea or vomiting no chest pain.   Objective: Vitals:   09/10/19 2051 09/11/19 0047 09/11/19 0446 09/11/19 1412  BP: 121/78 104/67 126/74 (!) 142/92  Pulse: 86 87 87 83  Resp: 20 20 19 20   Temp: 98.6 F (37 C) 98.8 F (37.1 C) 98.5 F (36.9 C) 98.8 F (37.1 C)  TempSrc: Oral Oral Oral Oral  SpO2: 93% 96% 94% 94%  Weight:      Height:        Intake/Output Summary (Last 24 hours) at 09/11/2019 1457 Last data filed at 09/11/2019 0919 Gross per 24 hour  Intake 1758.76 ml  Output 400 ml  Net 1358.76 ml   Filed Weights   09/10/19 0845  Weight: (!) 145.2 kg    Examination:   General: Not in pain or dyspnea, deconditioned and ill looking appearing  Neurology: Awake and alert, non focal  E ENT: no pallor, no icterus, oral mucosa moist Cardiovascular: No frank JVD short and wide neck . S1-S2 present, rhythmic, no gallops, rubs, or murmurs. Trace lower extremity edema. Pulmonary: positive breath sounds bilaterally, decreased air movement, no wheezing, rhonchi or rales. Gastrointestinal. Abdomen protuberant and distended with no organomegaly, non tender, no rebound or guarding Skin. No rashes Musculoskeletal: no joint deformities     Data Reviewed: I have personally reviewed following labs and imaging studies  CBC: Recent Labs  Lab 09/10/19 0834 09/10/19 0932 09/11/19 0516  WBC 6.8 7.7 7.6  HGB 13.0 13.2 11.3*  HCT 39.9 40.9  34.9*  MCV 88.5 90.9 89.3  PLT 246 231 371   Basic Metabolic Panel: Recent Labs  Lab 09/10/19 0834 09/10/19 0932 09/11/19 0516  NA 137  --  137  K 4.4  --  4.5  CL 102  --  106  CO2 22  --  19*  GLUCOSE 224*  --  193*  BUN 37*  --  50*  CREATININE 3.16* 3.75* 3.18*  CALCIUM 9.2  --  8.3*   GFR: Estimated Creatinine Clearance: 31.7 mL/min (A) (by C-G formula based on SCr of 3.18 mg/dL (H)). Liver Function Tests: No results for input(s): AST, ALT, ALKPHOS, BILITOT, PROT, ALBUMIN in the last 168 hours. No results for input(s): LIPASE, AMYLASE in the last 168 hours. No results for input(s): AMMONIA in the last 168 hours. Coagulation Profile: No results for input(s): INR, PROTIME in the last 168 hours. Cardiac Enzymes: No results for input(s): CKTOTAL, CKMB, CKMBINDEX, TROPONINI in the last 168 hours. BNP (last 3 results) No results for input(s): PROBNP in the last 8760  hours. HbA1C: Recent Labs    09/10/19 1644  HGBA1C 10.8*   CBG: Recent Labs  Lab 09/10/19 1652 09/10/19 2128 09/11/19 0752 09/11/19 1203  GLUCAP 247* 210* 193* 253*   Lipid Profile: No results for input(s): CHOL, HDL, LDLCALC, TRIG, CHOLHDL, LDLDIRECT in the last 72 hours. Thyroid Function Tests: No results for input(s): TSH, T4TOTAL, FREET4, T3FREE, THYROIDAB in the last 72 hours. Anemia Panel: No results for input(s): VITAMINB12, FOLATE, FERRITIN, TIBC, IRON, RETICCTPCT in the last 72 hours.    Radiology Studies: I have reviewed all of the imaging during this hospital visit personally     Scheduled Meds: . aspirin EC  81 mg Oral Daily  . brimonidine  1 drop Both Eyes BID  . carvedilol  25 mg Oral BID WC  . cyclobenzaprine  5 mg Oral QHS  . dextromethorphan-guaiFENesin  1 tablet Oral BID  . dorzolamide-timolol  1 drop Both Eyes BID  . enoxaparin (LOVENOX) injection  40 mg Subcutaneous Q24H  . glimepiride  4 mg Oral Daily  . insulin aspart  0-15 Units Subcutaneous TID WC  . insulin  aspart  0-5 Units Subcutaneous QHS  . isosorbide mononitrate  20 mg Oral BID  . latanoprost  1 drop Both Eyes QPM  . rosuvastatin  10 mg Oral Daily   Continuous Infusions: . azithromycin 500 mg (09/11/19 1431)  . cefTRIAXone (ROCEPHIN)  IV 2 g (09/11/19 1356)     LOS: 1 day        Vitoria Conyer Gerome Apley, MD

## 2019-09-11 NOTE — Progress Notes (Signed)
[place] for Apixaban Indication: atrial fibrillation  Allergies  Allergen Reactions  . Bee Pollen Anaphylaxis, Itching and Swelling  . Nsaids     CKD and CHF   Patient Measurements: Height: 5\' 2"  (157.5 cm) Weight: (!) 145.2 kg (320 lb) IBW/kg (Calculated) : 54.6  Vital Signs: Temp: 98.8 F (37.1 C) (06/06 1412) Temp Source: Oral (06/06 1412) BP: 142/92 (06/06 1412) Pulse Rate: 83 (06/06 1412)  Labs: Recent Labs    09/10/19 0834 09/10/19 0834 09/10/19 0932 09/10/19 0954 09/11/19 0516  HGB 13.0   < > 13.2  --  11.3*  HCT 39.9  --  40.9  --  34.9*  PLT 246  --  231  --  195  CREATININE 3.16*  --  3.75*  --  3.18*  TROPONINIHS 38*  --   --  32*  --    < > = values in this interval not displayed.   Estimated Creatinine Clearance: 31.7 mL/min (A) (by C-G formula based on SCr of 3.18 mg/dL (H)).  Medical History: Past Medical History:  Diagnosis Date  . Arthritis   . Cataracts, bilateral    right  . Congestive heart failure ([place])    a. s/p MDT single chamber ICD   . Diabetes mellitus    does not check blood sugars at home  . Diverticulosis   . Glaucoma    bilateral  . H/O TIA (transient ischemic attack) and stroke   . Hyperlipidemia   . Hypertension   . Obesity    Medications:  Scheduled:  . aspirin EC  81 mg Oral Daily  . brimonidine  1 drop Both Eyes BID  . carvedilol  25 mg Oral BID WC  . cyclobenzaprine  5 mg Oral QHS  . dorzolamide-timolol  1 drop Both Eyes BID  . furosemide  60 mg Intravenous BID  . insulin aspart  0-15 Units Subcutaneous TID WC  . insulin aspart  0-5 Units Subcutaneous QHS  . ipratropium-albuterol  3 mL Nebulization QID  . isosorbide mononitrate  20 mg Oral BID  . latanoprost  1 drop Both Eyes QPM  . rosuvastatin  10 mg Oral Daily    Assessment: 10 yoM with acute, hypoxic respiratory failure due to PNA, COPD exacerbation. V/Q scan 6/5 with no perfusion defects. Begin Apixaban for Afib,  would dose reduce to 2.5mg  bid for patients > 60 yr, < 60 kg, SCr > 1.5 - current age 59 yr; SCr 3.18 (d/w [facility] - would lean toward continuing 5mg  bid)  Goal of Therapy:  Monitor platelets by anticoagulation protocol: Yes   Plan:  Discontinue Lovenox Begin Apixaban 5mg  bid this evening Monitor CBC, s/s bleed  Minda Ditto PharmD 09/11/2019,5:03 PM

## 2019-09-11 NOTE — Progress Notes (Signed)
Entered room to give pt. HHN.  Pt. Stated that he was "cleaning up" and would not be available for some time.

## 2019-09-12 ENCOUNTER — Other Ambulatory Visit: Payer: Self-pay | Admitting: Family Medicine

## 2019-09-12 ENCOUNTER — Inpatient Hospital Stay (HOSPITAL_BASED_OUTPATIENT_CLINIC_OR_DEPARTMENT_OTHER): Payer: Medicare Other

## 2019-09-12 DIAGNOSIS — R0602 Shortness of breath: Secondary | ICD-10-CM

## 2019-09-12 DIAGNOSIS — S39012A Strain of muscle, fascia and tendon of lower back, initial encounter: Secondary | ICD-10-CM

## 2019-09-12 LAB — BASIC METABOLIC PANEL
Anion gap: 15 (ref 5–15)
BUN: 52 mg/dL — ABNORMAL HIGH (ref 6–20)
CO2: 17 mmol/L — ABNORMAL LOW (ref 22–32)
Calcium: 8.2 mg/dL — ABNORMAL LOW (ref 8.9–10.3)
Chloride: 103 mmol/L (ref 98–111)
Creatinine, Ser: 3.13 mg/dL — ABNORMAL HIGH (ref 0.61–1.24)
GFR calc Af Amer: 24 mL/min — ABNORMAL LOW (ref >60)
GFR calc non Af Amer: 20 mL/min — ABNORMAL LOW (ref >60)
Glucose, Bld: 186 mg/dL — ABNORMAL HIGH (ref 70–99)
Potassium: 4.2 mmol/L (ref 3.5–5.1)
Sodium: 135 mmol/L (ref 135–145)

## 2019-09-12 LAB — ECHOCARDIOGRAM COMPLETE
Height: 62 in
Weight: 5120 oz

## 2019-09-12 LAB — GLUCOSE, CAPILLARY
Glucose-Capillary: 191 mg/dL — ABNORMAL HIGH (ref 70–99)
Glucose-Capillary: 266 mg/dL — ABNORMAL HIGH (ref 70–99)
Glucose-Capillary: 177 mg/dL — ABNORMAL HIGH (ref 70–99)
Glucose-Capillary: 262 mg/dL — ABNORMAL HIGH (ref 70–99)

## 2019-09-12 MED ORDER — METHYLPREDNISOLONE SODIUM SUCC 40 MG IJ SOLR
40.0000 mg | Freq: Every day | INTRAMUSCULAR | Status: DC
  Administered 2019-09-12 – 2019-09-16 (×5): 40 mg via INTRAVENOUS
  Filled 2019-09-12 (×5): qty 1

## 2019-09-12 MED ORDER — SACUBITRIL-VALSARTAN 49-51 MG PO TABS
1.0000 | ORAL_TABLET | Freq: Two times a day (BID) | ORAL | Status: DC
  Administered 2019-09-12 – 2019-09-13 (×3): 1 via ORAL
  Filled 2019-09-12 (×4): qty 1

## 2019-09-12 MED ORDER — COLCHICINE 0.6 MG PO TABS
0.6000 mg | ORAL_TABLET | Freq: Every day | ORAL | Status: DC
  Administered 2019-09-12 – 2019-09-13 (×2): 0.6 mg via ORAL
  Filled 2019-09-12 (×2): qty 1

## 2019-09-12 MED ORDER — IPRATROPIUM-ALBUTEROL 0.5-2.5 (3) MG/3ML IN SOLN
3.0000 mL | Freq: Three times a day (TID) | RESPIRATORY_TRACT | Status: DC
  Administered 2019-09-12 – 2019-09-17 (×15): 3 mL via RESPIRATORY_TRACT
  Filled 2019-09-12 (×15): qty 3

## 2019-09-12 MED ORDER — SPIRONOLACTONE 25 MG PO TABS
25.0000 mg | ORAL_TABLET | Freq: Two times a day (BID) | ORAL | Status: DC
  Administered 2019-09-12 – 2019-09-13 (×3): 25 mg via ORAL
  Filled 2019-09-12 (×3): qty 1

## 2019-09-12 MED ORDER — FUROSEMIDE 40 MG PO TABS
40.0000 mg | ORAL_TABLET | Freq: Every day | ORAL | Status: DC
  Administered 2019-09-13: 40 mg via ORAL
  Filled 2019-09-12: qty 1

## 2019-09-12 MED ORDER — AZITHROMYCIN 250 MG PO TABS
500.0000 mg | ORAL_TABLET | Freq: Every day | ORAL | Status: DC
  Administered 2019-09-12 – 2019-09-15 (×4): 500 mg via ORAL
  Filled 2019-09-12 (×4): qty 2

## 2019-09-12 MED ORDER — AZITHROMYCIN 250 MG PO TABS: 500.0000 mg | ORAL_TABLET | Freq: Every day | ORAL | Status: DC

## 2019-09-12 MED ORDER — PERFLUTREN LIPID MICROSPHERE
1.0000 mL | INTRAVENOUS | Status: AC | PRN
  Administered 2019-09-12: 2 mL via INTRAVENOUS
  Filled 2019-09-12: qty 10

## 2019-09-12 MED ORDER — HYDROCOD POLST-CPM POLST ER 10-8 MG/5ML PO SUER
5.0000 mL | Freq: Two times a day (BID) | ORAL | Status: DC
  Administered 2019-09-12 – 2019-09-17 (×11): 5 mL via ORAL
  Filled 2019-09-12 (×11): qty 5

## 2019-09-12 NOTE — Progress Notes (Signed)
Echocardiogram 2D Echocardiogram has been performed.  [name] 09/12/2019, 8:59 AM

## 2019-09-12 NOTE — Progress Notes (Signed)
PROGRESS NOTE    [name]  TML:[id number] DOB: 1960/08/29 DOA: 09/10/2019 PCP: Dickie La, MD    Brief Narrative:  Patient admitted to the hospital with a working diagnosis of acute hypoxic respiratory failure due systolic heart failure decompensation, acute on chronic with acute bronchitis.  59 year old male who presented with dyspnea. He does have significant past medical history for hypertension, dyslipidemia, chronic kidney disease stage III, history of CVA, obesity class III, systolic heart failure and type 2 diabetes mellitus. Patient reported 5 days of worsening dyspnea, more severe over the last 48 hours, it has been refractive to oral diuretic therapy as an outpatient. Positive orthopnea and PND, no lower extremity edema.  On his initial physical examination blood pressure 84/65, 100/50, heart rate 92, respiratory rate 24-27, oxygen saturation 94% on supplemental oxygen.  His lungs had expiratory wheezing, no rhonchi, heart S1-S2, present rhythmic, soft abdomen, no significant lower extremity edema. Sodium 137, potassium 4.4, chloride 102, bicarb 22, glucose 224, BUN 37 creatinine 3.16, white count 6.8 hemoglobin 13.0, hematocrit 39.9 platelets 246.  SARS COVID-19 negative.  Chest radiograph was hypoinflated, large pulmonary artery, hilar vascular congestion with cephalization of vasculature.  Chest CT with faint bilateral groundglass opacities.  V/Q low probability for pulmonary embolism.   #1 EKG 109 bpm, normal axis, normal QTC, sinus rhythm with multiple PACs, no ST segment or T wave changes.  Low voltage  #2 EKG 117 bpm, normal axis, normal QTC, atrial fibrillation, no significant ST segment or T wave changes.  Low voltage.  Patient with persistent dyspnea despite diuresis, will add systemic steroids short course of azithromycin and continue bronchodilator therapy for presumed acute bronchitis.    Assessment & Plan:   Principal Problem:   Acute respiratory failure  (HCC) Active Problems:   HYPERTENSION, BENIGN   Chronic systolic heart failure (HCC)   RENAL DISEASE, CHRONIC, STAGE III   DM (diabetes mellitus), type 2 with renal complications (HCC)   Arthritis of knee, degenerative   Automatic implantable cardioverter-defibrillator in situ   DOE (dyspnea on exertion)   1. Acute hypoxic respiratory failure due to acute on chronic systolic heart failure, complicated with paroxysmal atrial fibrillation.  Echocardiogram with improvement in LV systolic function up to 55 to 60%, mild LVH with mild hypokinesis of the apical segment.   Will change back to oral furosemide for now, continue blood pressure control with Entresto and carvedilol, isosorbide and spironolactone.   2. New onset atrial fibrillation. No on sinus rhythm, continue anticoagulation with apixaban and rate control with carvedilol.   3, Acute bronchitis. Patient with persistent cough and wheezing, thick copious secretions.   Will continue bronchodilator therapy, will add systemic steroids and antibiotic therapy with azithromycin.   Antitussive regimen for severe and persistent cough.    2. AKI on CKD stage 3/ non anion gap metabolic acidosis. Renal function has been stable 3,13, serum K at 4,2 and bicarbonate at 17.   Will resume oral furosemide and continue spironolactone to keep negative fluids balance. Follow on renal panel in am.   3. HTN. On carvedilol and isosorbide, resume entresto.  4. Uncontrolled T2DM Hgb A1c 10.8/ dyslipidemia glucose is 186 fasting this morning, continue glucose cover and monitoring with insulin sliding scale.   Continue to hold on oral hypoglycemic agents.   Continue      5. Obesity class 3. Calculated BMI is 58,5. Will need outpatient follow up.   Status is: Inpatient  Remains inpatient appropriate because:IV treatments appropriate due to  intensity of illness or inability to take PO   Dispo: The patient is from: Home              Anticipated  d/c is to: Home              Anticipated d/c date is: 2 days              Patient currently is not medically stable to d/c.   DVT prophylaxis: apixaban   Code Status:   full  Family Communication:  No family at the bedside, yesterday I spoke with his wife over the phone for update.     *    Subjective: Patient continue to have dyspnea and productive cough, worse with exertion, no nausea or vomiting, no chest pain.   Objective: Vitals:   09/11/19 1412 09/11/19 1629 09/12/19 0614 09/12/19 0750  BP: (!) 142/92  (!) 147/88   Pulse: 83  (!) 108   Resp: 20     Temp: 98.8 F (37.1 C)  99.3 F (37.4 C)   TempSrc: Oral  Oral   SpO2: 94% 93% (!) 85% 90%  Weight:      Height:        Intake/Output Summary (Last 24 hours) at 09/12/2019 1309 Last data filed at 09/11/2019 2200 Gross per 24 hour  Intake 536 ml  Output 950 ml  Net -414 ml   Filed Weights   09/10/19 0845  Weight: (!) 145.2 kg    Examination:   General: positive dyspnea at rest.  Neurology: Awake and alert, non focal  E ENT: milf pallor, no icterus, oral mucosa moist Cardiovascular: No JVD. S1-S2 present, rhythmic, no gallops, rubs, or murmurs. No lower extremity edema. Pulmonary: positive breath sounds bilaterally, decreased air movement, positive diffuse bilateral wheezing, with scattered rhonchi and rales. Predominant rhonchi at the proximal airway.  Gastrointestinal. Abdomen with, no organomegaly, non tender, no rebound or guarding, distended and tympanic.  Skin. No rashes Musculoskeletal: no joint deformities     Data Reviewed: I have personally reviewed following labs and imaging studies  CBC: Recent Labs  Lab 09/10/19 0834 09/10/19 0932 09/11/19 0516  WBC 6.8 7.7 7.6  HGB 13.0 13.2 11.3*  HCT 39.9 40.9 34.9*  MCV 88.5 90.9 89.3  PLT 246 231 097   Basic Metabolic Panel: Recent Labs  Lab 09/10/19 0834 09/10/19 0932 09/11/19 0516 09/12/19 0619  NA 137  --  137 135  K 4.4  --  4.5 4.2  CL  102  --  106 103  CO2 22  --  19* 17*  GLUCOSE 224*  --  193* 186*  BUN 37*  --  50* 52*  CREATININE 3.16* 3.75* 3.18* 3.13*  CALCIUM 9.2  --  8.3* 8.2*   GFR: Estimated Creatinine Clearance: 32.2 mL/min (A) (by C-G formula based on SCr of 3.13 mg/dL (H)). Liver Function Tests: No results for input(s): AST, ALT, ALKPHOS, BILITOT, PROT, ALBUMIN in the last 168 hours. No results for input(s): LIPASE, AMYLASE in the last 168 hours. No results for input(s): AMMONIA in the last 168 hours. Coagulation Profile: No results for input(s): INR, PROTIME in the last 168 hours. Cardiac Enzymes: No results for input(s): CKTOTAL, CKMB, CKMBINDEX, TROPONINI in the last 168 hours. BNP (last 3 results) No results for input(s): PROBNP in the last 8760 hours. HbA1C: Recent Labs    09/10/19 1644  HGBA1C 10.8*   CBG: Recent Labs  Lab 09/11/19 1203 09/11/19 1653 09/11/19 2125 09/12/19 0813 09/12/19  [place]* 199* 191* 191* 266*   Lipid Profile: No results for input(s): CHOL, HDL, LDLCALC, TRIG, CHOLHDL, LDLDIRECT in the last 72 hours. Thyroid Function Tests: No results for input(s): TSH, T4TOTAL, FREET4, T3FREE, THYROIDAB in the last 72 hours. Anemia Panel: No results for input(s): VITAMINB12, FOLATE, FERRITIN, TIBC, IRON, RETICCTPCT in the last 72 hours.    Radiology Studies: I have reviewed all of the imaging during this hospital visit personally     Scheduled Meds: . apixaban  5 mg Oral BID  . aspirin EC  81 mg Oral Daily  . brimonidine  1 drop Both Eyes BID  . carvedilol  25 mg Oral BID WC  . cyclobenzaprine  5 mg Oral QHS  . dorzolamide-timolol  1 drop Both Eyes BID  . furosemide  60 mg Intravenous BID  . insulin aspart  0-15 Units Subcutaneous TID WC  . insulin aspart  0-5 Units Subcutaneous QHS  . ipratropium-albuterol  3 mL Nebulization TID  . isosorbide mononitrate  20 mg Oral BID  . latanoprost  1 drop Both Eyes QPM  . rosuvastatin  10 mg Oral Daily    Continuous Infusions:   LOS: 2 days        Mauricio Gerome Apley, MD

## 2019-09-13 ENCOUNTER — Inpatient Hospital Stay (HOSPITAL_COMMUNITY): Payer: Medicare Other

## 2019-09-13 LAB — BASIC METABOLIC PANEL
Anion gap: 14 (ref 5–15)
BUN: 67 mg/dL — ABNORMAL HIGH (ref 6–20)
CO2: 18 mmol/L — ABNORMAL LOW (ref 22–32)
Calcium: 8 mg/dL — ABNORMAL LOW (ref 8.9–10.3)
Chloride: 103 mmol/L (ref 98–111)
Creatinine, Ser: 4.19 mg/dL — ABNORMAL HIGH (ref 0.61–1.24)
GFR calc Af Amer: 17 mL/min — ABNORMAL LOW (ref >60)
GFR calc non Af Amer: 14 mL/min — ABNORMAL LOW (ref >60)
Glucose, Bld: 262 mg/dL — ABNORMAL HIGH (ref 70–99)
Potassium: 4.7 mmol/L (ref 3.5–5.1)
Sodium: 135 mmol/L (ref 135–145)

## 2019-09-13 LAB — GLUCOSE, CAPILLARY
Glucose-Capillary: 263 mg/dL — ABNORMAL HIGH (ref 70–99)
Glucose-Capillary: 277 mg/dL — ABNORMAL HIGH (ref 70–99)
Glucose-Capillary: 333 mg/dL — ABNORMAL HIGH (ref 70–99)
Glucose-Capillary: 320 mg/dL — ABNORMAL HIGH (ref 70–99)

## 2019-09-13 NOTE — Progress Notes (Signed)
Inpatient Diabetes Program Recommendations  AACE/ADA: New Consensus Statement on Inpatient Glycemic Control (2015)  Target Ranges:  Prepandial:   less than 140 mg/dL      Peak postprandial:   less than 180 mg/dL (1-2 hours)      Critically ill patients:  140 - 180 mg/dL   Lab Results  Component Value Date   GLUCAP 263 (H) 09/13/2019   HGBA1C 10.8 (H) 09/10/2019    Review of Glycemic Control  Diabetes history: DM2 Outpatient Diabetes medications: Amaryl 4 mg QD Current orders for Inpatient glycemic control: Novolog 0-15 units tidwc and hs  On Solumedrol 40 mg QD HgbA1C - 10.8% - uncontrolled  Inpatient Diabetes Program Recommendations:     Add Lantus 12 units QHS Add Novolog 4 units tidwc for meal coverage insulin.  Will speak with pt about HgbA1C and possibility of going home on insulin.  Continue to follow.  Thank you. Lorenda Peck, RD, LDN, CDE Inpatient Diabetes Coordinator [phone]

## 2019-09-13 NOTE — Care Management Important Message (Signed)
Important Message  Patient Details IM Letter given to Roque Lias SW Case Manager to present to the Patient Name: [name] MRN: [id number] Date of Birth: [birth year]   Medicare Important Message Given:  Yes     Kerin Salen 09/13/2019, 10:53 AM

## 2019-09-13 NOTE — Progress Notes (Signed)
PROGRESS NOTE    [name]  EXH:[id number] DOB: 11/04/1960 DOA: 09/10/2019 PCP: Dickie La, MD    Brief Narrative:  Patient admitted to the hospital with a working diagnosis of acute hypoxic respiratory failure due systolic heart failure decompensation, acute on chronic with acute bronchitis.  59 year old male who presented with dyspnea. He does have significant past medical history for hypertension, dyslipidemia, chronic kidney disease stage III, history of CVA,obesity class III, systolic heart failure and type 2 diabetes mellitus. Patient reported 5 days of worsening dyspnea, more severe over the last 48 hours, it has been refractive tooraldiuretic therapy as an outpatient.Positive orthopnea and PND, no lower extremity edema. On his initial physical examination blood pressure 84/65, 100/50, heart rate 92, respiratory rate 24-27, oxygen saturation 94% on supplemental oxygen. His lungs had expiratory wheezing, no rhonchi, heart S1-S2, present rhythmic, soft abdomen, no significant lower extremity edema. Sodium 137, potassium 4.4, chloride 102, bicarb 22, glucose 224, BUN 37 creatinine 3.16, white count 6.8 hemoglobin 13.0, hematocrit 39.9 platelets 246. SARS COVID-19 negative. Chest radiograph was hypoinflated, large pulmonary artery, hilar vascular congestion with cephalization of vasculature. Chest CT with faint bilateral groundglass opacities. V/Q low probability for pulmonary embolism.   #1 EKG109 bpm, normal axis, normal QTC, sinus rhythm with multiple PACs, no ST segment or T wave changes. Low voltage  #2EKG 117 bpm, normal axis, normal QTC, atrial fibrillation, no significant ST segment or T wave changes. Low voltage.  Patient with persistent dyspnea despite diuresis, adde systemic steroids and a short course of azithromycin plus continue bronchodilator therapy for presumed acute bronchitis.  Patient now with improvement in his dyspnea and cough, but with  worsening renal function.    Assessment & Plan:   Principal Problem:   Acute respiratory failure (HCC) Active Problems:   HYPERTENSION, BENIGN   Chronic systolic heart failure (HCC)   RENAL DISEASE, CHRONIC, STAGE III   DM (diabetes mellitus), type 2 with renal complications (HCC)   Arthritis of knee, degenerative   Automatic implantable cardioverter-defibrillator in situ   DOE (dyspnea on exertion)    1. Acute hypoxic respiratory failure due to acute on chronic systolic heart failure, complicated with paroxysmal atrial fibrillation. Echocardiogram with improvement in LV systolic function up to 55 to 60%, mild LVH with mild hypokinesis of the apical segment.   Continue hemodynamically stable, volume status difficult to evaluate due to obesity and abdominal distention. Because worsening renal function will hold on further diuresis and Entresto.   Continue close monitoring of blood pressure.    2. New onset atrial fibrillation. Continue on sinus rhythm will continue anticoagulation with apixaban and rate control with carvedilol.   3, Acute bronchitis.  With medical therapy including steroids and azithrmoycin along with bronchodilators, his symptoms are improving. Will continue to wean off supplemental oxygen to target oxygen saturation more than 92%.  Continue with antitussive with good toleration.   2. AKI on CKD stage 3/ non anion gap metabolic acidosis. Renal function is worsening, today serum cr at 4,19 with K at 4,7 and serum bicarbonate at 18.   Will hold diuretics and entresto for now, will follow on renal panel in am, patient clinically seems euvolemic.   3. HTN. Continue with carvediloland isosorbide, blood pressure 108/64 mmHg.   4. Uncontrolled T2DM Hgb A1c 10.8/ dyslipidemiaOn glucose cover and monitoring with insulin sliding scale.His fasting glucose this am is 262.   Continue to hold on oral hypoglycemic agents.  On rosuvastatin.   5. Abdominal  distention.  abdominal film with gas distention in the stomach and transverse colon. Will change to clear liquids for now, no fran ileus clinically but need close follow up.   Status is: Inpatient  Remains inpatient appropriate because:Inpatient level of care appropriate due to severity of illness   Dispo: The patient is from: Home              Anticipated d/c is to: Home              Anticipated d/c date is: 3 days              Patient currently is not medically stable to d/c.   DVT prophylaxis: Enoxaparin   Code Status:    full  Family Communication:  No family at the bedside       Antimicrobials:   Azithromycin     Subjective: Patient with improvement in dyspnea and cough, no further wheezing, no chest pain, continue to have abdominal distention with no nausea or vomiting, positive bowel movement and flatus.   Objective: Vitals:   09/13/19 0753 09/13/19 1313 09/13/19 1329 09/13/19 1556  BP:   108/64   Pulse: 78  73   Resp: 18  18   Temp:   97.6 F (36.4 C)   TempSrc:   Oral   SpO2: 94% 93% 96% 94%  Weight:      Height:        Intake/Output Summary (Last 24 hours) at 09/13/2019 1608 Last data filed at 09/13/2019 1244 Gross per 24 hour  Intake 569 ml  Output --  Net 569 ml   Filed Weights   09/10/19 0845  Weight: (!) 145.2 kg    Examination:   General: Not in pain or dyspnea  Neurology: Awake and alert, non focal  E ENT: no pallor, no icterus, oral mucosa moist Cardiovascular: No JVD. S1-S2 present, rhythmic, no gallops, rubs, or murmurs. No lower extremity edema. Pulmonary: positive breath sounds bilaterally,decreased air movement, no frank wheezing, rhonchi or rales. Gastrointestinal. Abdomen distended and tympanic with no organomegaly, non tender, no rebound or guarding Skin. No rashes Musculoskeletal: no joint deformities     Data Reviewed: I have personally reviewed following labs and imaging studies  CBC: Recent Labs  Lab 09/10/19 0834  09/10/19 0932 09/11/19 0516  WBC 6.8 7.7 7.6  HGB 13.0 13.2 11.3*  HCT 39.9 40.9 34.9*  MCV 88.5 90.9 89.3  PLT 246 231 948   Basic Metabolic Panel: Recent Labs  Lab 09/10/19 0834 09/10/19 0932 09/11/19 0516 09/12/19 0619 09/13/19 0451  NA 137  --  137 135 135  K 4.4  --  4.5 4.2 4.7  CL 102  --  106 103 103  CO2 22  --  19* 17* 18*  GLUCOSE 224*  --  193* 186* 262*  BUN 37*  --  50* 52* 67*  CREATININE 3.16* 3.75* 3.18* 3.13* 4.19*  CALCIUM 9.2  --  8.3* 8.2* 8.0*   GFR: Estimated Creatinine Clearance: 24.1 mL/min (A) (by C-G formula based on SCr of 4.19 mg/dL (H)). Liver Function Tests: No results for input(s): AST, ALT, ALKPHOS, BILITOT, PROT, ALBUMIN in the last 168 hours. No results for input(s): LIPASE, AMYLASE in the last 168 hours. No results for input(s): AMMONIA in the last 168 hours. Coagulation Profile: No results for input(s): INR, PROTIME in the last 168 hours. Cardiac Enzymes: No results for input(s): CKTOTAL, CKMB, CKMBINDEX, TROPONINI in the last 168 hours. BNP (last 3 results) No results for  input(s): PROBNP in the last 8760 hours. HbA1C: Recent Labs    09/10/19 1644  HGBA1C 10.8*   CBG: Recent Labs  Lab 09/12/19 1134 09/12/19 1711 09/12/19 2047 09/13/19 0804 09/13/19 1152  GLUCAP 266* 177* 262* 263* 277*   Lipid Profile: No results for input(s): CHOL, HDL, LDLCALC, TRIG, CHOLHDL, LDLDIRECT in the last 72 hours. Thyroid Function Tests: No results for input(s): TSH, T4TOTAL, FREET4, T3FREE, THYROIDAB in the last 72 hours. Anemia Panel: No results for input(s): VITAMINB12, FOLATE, FERRITIN, TIBC, IRON, RETICCTPCT in the last 72 hours.    Radiology Studies: I have reviewed all of the imaging during this hospital visit personally     Scheduled Meds: . apixaban  5 mg Oral BID  . azithromycin  500 mg Oral Daily  . brimonidine  1 drop Both Eyes BID  . carvedilol  25 mg Oral BID WC  . chlorpheniramine-HYDROcodone  5 mL Oral Q12H  .  cyclobenzaprine  5 mg Oral QHS  . dorzolamide-timolol  1 drop Both Eyes BID  . insulin aspart  0-15 Units Subcutaneous TID WC  . insulin aspart  0-5 Units Subcutaneous QHS  . ipratropium-albuterol  3 mL Nebulization TID  . isosorbide mononitrate  20 mg Oral BID  . latanoprost  1 drop Both Eyes QPM  . methylPREDNISolone (SOLU-MEDROL) injection  40 mg Intravenous Daily  . rosuvastatin  10 mg Oral Daily   Continuous Infusions:   LOS: 3 days        Adam Gesell Gerome Apley, MD

## 2019-09-14 ENCOUNTER — Ambulatory Visit: Payer: Medicare Other | Admitting: Family Medicine

## 2019-09-14 ENCOUNTER — Telehealth: Payer: Self-pay | Admitting: Family Medicine

## 2019-09-14 DIAGNOSIS — J9601 Acute respiratory failure with hypoxia: Principal | ICD-10-CM

## 2019-09-14 DIAGNOSIS — N179 Acute kidney failure, unspecified: Secondary | ICD-10-CM

## 2019-09-14 LAB — CBC
HCT: 36.1 % — ABNORMAL LOW (ref 39.0–52.0)
Hemoglobin: 11.8 g/dL — ABNORMAL LOW (ref 13.0–17.0)
MCH: 28.6 pg (ref 26.0–34.0)
MCHC: 32.7 g/dL (ref 30.0–36.0)
MCV: 87.4 fL (ref 80.0–100.0)
Platelets: 247 10*3/uL (ref 150–400)
RBC: 4.13 MIL/uL — ABNORMAL LOW (ref 4.22–5.81)
RDW: 13.2 % (ref 11.5–15.5)
WBC: 14.3 10*3/uL — ABNORMAL HIGH (ref 4.0–10.5)
nRBC: 0 % (ref 0.0–0.2)

## 2019-09-14 LAB — GLUCOSE, CAPILLARY
Glucose-Capillary: 270 mg/dL — ABNORMAL HIGH (ref 70–99)
Glucose-Capillary: 330 mg/dL — ABNORMAL HIGH (ref 70–99)
Glucose-Capillary: 284 mg/dL — ABNORMAL HIGH (ref 70–99)
Glucose-Capillary: 303 mg/dL — ABNORMAL HIGH (ref 70–99)

## 2019-09-14 LAB — BASIC METABOLIC PANEL
Anion gap: 13 (ref 5–15)
BUN: 86 mg/dL — ABNORMAL HIGH (ref 6–20)
CO2: 19 mmol/L — ABNORMAL LOW (ref 22–32)
Calcium: 7.5 mg/dL — ABNORMAL LOW (ref 8.9–10.3)
Chloride: 99 mmol/L (ref 98–111)
Creatinine, Ser: 3.79 mg/dL — ABNORMAL HIGH (ref 0.61–1.24)
GFR calc Af Amer: 19 mL/min — ABNORMAL LOW (ref >60)
GFR calc non Af Amer: 16 mL/min — ABNORMAL LOW (ref >60)
Glucose, Bld: 324 mg/dL — ABNORMAL HIGH (ref 70–99)
Potassium: 4.1 mmol/L (ref 3.5–5.1)
Sodium: 131 mmol/L — ABNORMAL LOW (ref 135–145)

## 2019-09-14 MED ORDER — INSULIN GLARGINE 100 UNIT/ML ~~LOC~~ SOLN
15.0000 [IU] | Freq: Every day | SUBCUTANEOUS | Status: DC
  Administered 2019-09-14 – 2019-09-16 (×3): 15 [IU] via SUBCUTANEOUS
  Filled 2019-09-14 (×3): qty 0.15

## 2019-09-14 MED ORDER — INSULIN ASPART 100 UNIT/ML ~~LOC~~ SOLN
0.0000 [IU] | Freq: Three times a day (TID) | SUBCUTANEOUS | Status: DC
  Administered 2019-09-14: 11 [IU] via SUBCUTANEOUS
  Administered 2019-09-15: 15 [IU] via SUBCUTANEOUS
  Administered 2019-09-15 – 2019-09-17 (×6): 7 [IU] via SUBCUTANEOUS
  Administered 2019-09-17: 4 [IU] via SUBCUTANEOUS

## 2019-09-14 NOTE — Progress Notes (Addendum)
PROGRESS NOTE    ABBIE JABLON    Code Status: Full Code  LFY:[id number] DOB: 1960/10/05 DOA: 09/10/2019 LOS: 4 days  PCP: Dickie La, MD CC:  Chief Complaint  Patient presents with  . Shortness of Breath       Hospital Summary   This is a 59 year old male with history of hypertension, hyperlipidemia, CKD 3a, CVA, obesity, systolic heart failure, type 2 diabetes who presented with worsening dyspnea x5 days refractive to oral diuretics outpatient with positive orthopnea and PND.  Initial BP 84/65, HR 92, RR 24-27, SPO2 94% on supplemental O2 with expiratory wheeze. Chest radiograph was hypoinflated, large pulmonary artery, hilar vascular congestion with cephalization of vasculature. Chest CT with faint bilateral groundglass opacities. V/Q low probability for pulmonary embolism.  Patient was admitted for acute hypoxic respiratory failure secondary to acute on chronic systolic heart failure complicated by paroxysmal atrial fibrillation.  Also noted to have AKI.     A & P   Principal Problem:   Acute respiratory failure (HCC) Active Problems:   HYPERTENSION, BENIGN   Chronic systolic heart failure (HCC)   RENAL DISEASE, CHRONIC, STAGE III   DM (diabetes mellitus), type 2 with renal complications (HCC)   Arthritis of knee, degenerative   Automatic implantable cardioverter-defibrillator in situ   DOE (dyspnea on exertion)   1. Acute hypoxic respiratory failure secondary to acute bronchitis and complicated by paroxysmal atrial fibrillation a. Echo with improved LV systolic function up to 55 to 60% with mild LVH and mild hypokinesis of apical segment. BNP 18, unlikely an acute heart failure exacerbation with such low BNP b. Continue bronchodilators and azithromycin c. Started on systemic steroids  2. Chronic systolic heart failure with history of pacemaker a. On Entresto, Coreg, isosorbide mononitrate, Lasix at home b. Continue I/O and daily weights c. Diuresis and Entresto  have been on hold due to poor renal function  3. New onset A. fib a. CHA2DS2-VASc: 6 (CHF, hypertension, CVA, vascular disease, diabetes) b. Started on Eliquis c. Continue on carvedilol  4. AKI on CKD 3 a a. Creatinine peaked at 4.19, currently 3.79 b. Continue current management as above  5. Hypertension a. Continue carvedilol, isosorbide mono nitrate b. Holding Entresto and spironolactone  6. NAGMA, improving  7. Poorly controlled type 2 diabetes a. HA1C 10.8 b. Currently on steroids c. Add on Lantus 20 units daily and increase to resistant sliding scale, on steroids continue nighttime sliding scale  8. Obesity class III a. Follow-up outpatient  DVT prophylaxis: Eliquis Family Communication: Patient's wife by phone has been updated  Disposition Plan: DC pending improvement of kidney function, probably 2 to 3 days Status is: Inpatient  Remains inpatient appropriate because:Inpatient level of care appropriate due to severity of illness   Dispo: The patient is from: Home              Anticipated d/c is to: Home              Anticipated d/c date is: 2 days              Patient currently is not medically stable to d/c.     Pressure injury documentation    None  Consultants  None  Procedures  None  Antibiotics   Anti-infectives (From admission, onward)   Start     Dose/Rate Route Frequency Ordered Stop   09/12/19 1400  azithromycin (ZITHROMAX) tablet 500 mg     500 mg Oral Daily 09/12/19 1334 09/17/19 0959  09/12/19 1345  azithromycin (ZITHROMAX) tablet 500 mg  Status:  Discontinued     500 mg Oral Daily 09/12/19 1331 09/12/19 1334   09/10/19 1400  cefTRIAXone (ROCEPHIN) 2 g in sodium chloride 0.9 % 100 mL IVPB  Status:  Discontinued     2 g 200 mL/hr over 30 Minutes Intravenous Every 24 hours 09/10/19 1320 09/11/19 1602   09/10/19 1400  azithromycin (ZITHROMAX) 500 mg in sodium chloride 0.9 % 250 mL IVPB  Status:  Discontinued     500 mg 250 mL/hr over 60  Minutes Intravenous Every 24 hours 09/10/19 1320 09/11/19 1602        Subjective   Patient seen and examined at bedside in no acute distress and resting comfortably. No acute events overnight. Denies any acute complaints at this time.  Tolerating diet well.   Objective   Vitals:   09/14/19 0752 09/14/19 0955 09/14/19 1402 09/14/19 1452  BP:   101/78   Pulse:   71   Resp:   19   Temp:   97.9 F (36.6 C)   TempSrc:      SpO2: (!) 87% 92% 100% 90%  Weight:      Height:        Intake/Output Summary (Last 24 hours) at 09/14/2019 1541 Last data filed at 09/14/2019 1300 Gross per 24 hour  Intake 700 ml  Output --  Net 700 ml   Filed Weights   09/10/19 0845  Weight: (!) 145.2 kg    Examination:  Physical Exam Vitals and nursing note reviewed.  Constitutional:      Appearance: Normal appearance. He is obese.  HENT:     Head: Normocephalic and atraumatic.  Eyes:     Conjunctiva/sclera: Conjunctivae normal.  Cardiovascular:     Rate and Rhythm: Normal rate and regular rhythm.     Comments: Sinus rhythm on telemetry Pulmonary:     Effort: Pulmonary effort is normal.     Breath sounds: Wheezing present.  Abdominal:     General: Abdomen is flat.     Palpations: Abdomen is soft.  Musculoskeletal:        General: No swelling or tenderness.  Skin:    Coloration: Skin is not jaundiced or pale.  Neurological:     Mental Status: He is alert. Mental status is at baseline.  Psychiatric:        Mood and Affect: Mood normal.        Behavior: Behavior normal.     Data Reviewed: I have personally reviewed following labs and imaging studies  CBC: Recent Labs  Lab 09/10/19 0834 09/10/19 0932 09/11/19 0516 09/14/19 0357  WBC 6.8 7.7 7.6 14.3*  HGB 13.0 13.2 11.3* 11.8*  HCT 39.9 40.9 34.9* 36.1*  MCV 88.5 90.9 89.3 87.4  PLT 246 231 195 193   Basic Metabolic Panel: Recent Labs  Lab 09/10/19 0834 09/10/19 0834 09/10/19 0932 09/11/19 0516 09/12/19 0619  09/13/19 0451 09/14/19 0357  NA 137  --   --  137 135 135 131*  K 4.4  --   --  4.5 4.2 4.7 4.1  CL 102  --   --  106 103 103 99  CO2 22  --   --  19* 17* 18* 19*  GLUCOSE 224*  --   --  193* 186* 262* 324*  BUN 37*  --   --  50* 52* 67* 86*  CREATININE 3.16*   < > 3.75* 3.18* 3.13* 4.19* 3.79*  CALCIUM  9.2  --   --  8.3* 8.2* 8.0* 7.5*   < > = values in this interval not displayed.   GFR: Estimated Creatinine Clearance: 26.6 mL/min (A) (by C-G formula based on SCr of 3.79 mg/dL (H)). Liver Function Tests: No results for input(s): AST, ALT, ALKPHOS, BILITOT, PROT, ALBUMIN in the last 168 hours. No results for input(s): LIPASE, AMYLASE in the last 168 hours. No results for input(s): AMMONIA in the last 168 hours. Coagulation Profile: No results for input(s): INR, PROTIME in the last 168 hours. Cardiac Enzymes: No results for input(s): CKTOTAL, CKMB, CKMBINDEX, TROPONINI in the last 168 hours. BNP (last 3 results) No results for input(s): PROBNP in the last 8760 hours. HbA1C: No results for input(s): HGBA1C in the last 72 hours. CBG: Recent Labs  Lab 09/13/19 1152 09/13/19 1648 09/13/19 2006 09/14/19 0737 09/14/19 1134  GLUCAP 277* 333* 320* 270* 330*   Lipid Profile: No results for input(s): CHOL, HDL, LDLCALC, TRIG, CHOLHDL, LDLDIRECT in the last 72 hours. Thyroid Function Tests: No results for input(s): TSH, T4TOTAL, FREET4, T3FREE, THYROIDAB in the last 72 hours. Anemia Panel: No results for input(s): VITAMINB12, FOLATE, FERRITIN, TIBC, IRON, RETICCTPCT in the last 72 hours. Sepsis Labs: No results for input(s): PROCALCITON, LATICACIDVEN in the last 168 hours.  Recent Results (from the past 240 hour(s))  SARS CORONAVIRUS 2 (TAT 6-24 HRS) Nasopharyngeal Nasopharyngeal Swab     Status: None   Collection Time: 09/09/19  1:36 PM   Specimen: Nasopharyngeal Swab  Result Value Ref Range Status   SARS Coronavirus 2 NEGATIVE NEGATIVE Final    Comment: (NOTE) SARS-CoV-2  target nucleic acids are NOT DETECTED. The SARS-CoV-2 RNA is generally detectable in upper and lower respiratory specimens during the acute phase of infection. Negative results do not preclude SARS-CoV-2 infection, do not rule out co-infections with other pathogens, and should not be used as the sole basis for treatment or other patient management decisions. Negative results must be combined with clinical observations, patient history, and epidemiological information. The expected result is Negative. Fact Sheet for Patients: SugarRoll.be Fact Sheet for Healthcare Providers: https://www.woods-mathews.com/ This test is not yet approved or cleared by the [place] FDA and  has been authorized for detection and/or diagnosis of SARS-CoV-2 by FDA under an Emergency Use Authorization (EUA). This EUA will remain  in effect (meaning this test can be used) for the duration of the COVID-19 declaration under Section 56 4(b)(1) of the Act, 21 U.S.C. section 360bbb-3(b)(1), unless the authorization is terminated or revoked sooner. Performed at [facility], [place] [address] [place], [place] [address]          Radiology Studies: DG Abd 1 View  Result Date: 09/13/2019 CLINICAL DATA:  Abdominal distention EXAM: ABDOMEN - 1 VIEW COMPARISON:  None. FINDINGS: Lung bases clear. There is gaseous distension of the stomach and transverse colon. No small bowel distention. No organomegaly or free air. IMPRESSION: Nonspecific bowel gas pattern with gaseous distention of the stomach and transverse colon. This may reflect ileus. Electronically Signed   By: Rolm Baptise M.D.   On: 09/13/2019 16:44        Scheduled Meds: . apixaban  5 mg Oral BID  . azithromycin  500 mg Oral Daily  . brimonidine  1 drop Both Eyes BID  . carvedilol  25 mg Oral BID WC  . chlorpheniramine-HYDROcodone  5 mL Oral Q12H  . cyclobenzaprine  5 mg Oral QHS  . dorzolamide-timolol   1 drop Both  Eyes BID  . insulin aspart  0-15 Units Subcutaneous TID WC  . insulin aspart  0-5 Units Subcutaneous QHS  . ipratropium-albuterol  3 mL Nebulization TID  . isosorbide mononitrate  20 mg Oral BID  . latanoprost  1 drop Both Eyes QPM  . methylPREDNISolone (SOLU-MEDROL) injection  40 mg Intravenous Daily  . rosuvastatin  10 mg Oral Daily   Continuous Infusions:   Time spent: 29 minutes with over 50% of the time coordinating the patient's care    Harold Hedge, DO [facility] Pager [phone]  Call night coverage person covering after 7pm

## 2019-09-14 NOTE — TOC Initial Note (Signed)
Transition of Care [facility]) - Initial/Assessment Note    Patient Details  Name: [name] MRN: [id number] Date of Birth: October 11, 1960  Transition of [facility]) CM/SW Contact:    Trish Mage, LCSW Phone Number: 09/14/2019, 3:59 PM  Clinical Narrative:    Patient seen in follow up to PT recommendation of HH PT, cane and rollator.  [name] is blind, lives here in [place] with wife.  He insists he does not need HH PT, saying that his only problem is that he gets winded easily, and that is his baseline.  He does confirm that he is in need of both a cane and rollator at d/c.  Will need orders for same. TOC will continue to follow during the course of hospitalization.                Expected Discharge Plan: Home/Self Care Barriers to Discharge: No Barriers Identified   Patient Goals and CMS Choice Patient states their goals for this hospitalization and ongoing recovery are:: Go home      Expected Discharge Plan and Services Expected Discharge Plan: Home/Self Care   Discharge Planning Services: CM Consult Post Acute Care Choice: Durable Medical Equipment Living arrangements for the past 2 months: Apartment                                      Prior Living Arrangements/Services Living arrangements for the past 2 months: Apartment Lives with:: Spouse Patient language and need for interpreter reviewed:: Yes Do you feel safe going back to the place where you live?: Yes      Need for Family Participation in Patient Care: Yes (Comment) Care giver support system in place?: Yes (comment) Current home services: DME Criminal Activity/Legal Involvement Pertinent to Current Situation/Hospitalization: No - Comment as needed  Activities of Daily Living Home Assistive Devices/Equipment: Cane (specify quad or straight)(straight) ADL Screening (condition at time of admission) Patient's cognitive ability adequate to safely complete daily activities?: No Is the patient deaf or  have difficulty hearing?: No Does the patient have difficulty seeing, even when wearing glasses/contacts?: Yes Does the patient have difficulty concentrating, remembering, or making decisions?: No Patient able to express need for assistance with ADLs?: Yes Does the patient have difficulty dressing or bathing?: Yes Independently performs ADLs?: No Communication: Needs assistance Is this a change from baseline?: Pre-admission baseline Does the patient have difficulty walking or climbing stairs?: Yes Weakness of Legs: None Weakness of Arms/Hands: None  Permission Sought/Granted                  Emotional Assessment Appearance:: Appears older than stated age Attitude/Demeanor/Rapport: Engaged Affect (typically observed): Appropriate Orientation: : Oriented to Self, Oriented to Place, Oriented to  Time, Oriented to Situation Alcohol / Substance Use: Not Applicable    Admission diagnosis:  Acute respiratory failure ([place]) [J96.00] Shortness of breath [R06.02] AKI (acute kidney injury) ([place]) [N17.9] Patient Active Problem List   Diagnosis Date Noted  . Acute respiratory failure ([place]) 09/10/2019  . Cough 09/09/2019  . Paroxysmal VT ([place]) 06/22/2019  . Lumbar strain 09/15/2018  . Testicular pain 05/21/2018  . Trigger finger, acquired 11/20/2017  . Glaucoma 11/07/2015  . NICM (nonischemic cardiomyopathy) ([place]) 05/30/2015  . DOE (dyspnea on exertion) 09/20/2014  . Encounter for chronic pain management 05/17/2014  . Automatic implantable cardioverter-defibrillator in situ 07/15/2013  . One eye blind, one eye low vision 06/15/2013  .  Arthritis of knee, degenerative 10/03/2011  . Hyperlipidemia with target low density lipoprotein (LDL) cholesterol less than 100 mg/dL 08/20/2011  . Single implantable cardioverter-defibrillator (ICD) in situ 06/17/2011  . OBESITY, MORBID 05/21/2010  . SHOULDER PAIN, LEFT 08/01/2009  . Obstructive sleep apnea 03/27/2009  . DM (diabetes mellitus),  type 2 with renal complications ([place]) 01/29/4861  . HYPERTENSION, BENIGN 02/18/2007  . Chronic systolic heart failure ([place]) 02/18/2007  . RENAL DISEASE, CHRONIC, STAGE III 02/18/2007   PCP:  Dickie La, MD Pharmacy:   [place], [place] - [address] [place] [address] Phone: [phone] Fax: [place], [place] [address] Phone: [phone] Fax: [phone]     Social Determinants of Health (SDOH) Interventions    Readmission Risk Interventions No flowsheet data found.

## 2019-09-14 NOTE — Evaluation (Addendum)
Physical Therapy Evaluation Patient Details Name: [name] MRN: [id number] DOB: 25-Mar-1961 Today's Date: 09/14/2019   History of Present Illness  59 year old male who presented with dyspnea. He does have significant past medical history for hypertension, dyslipidemia, chronic kidney disease stage III, history of CVA, obesity class III, systolic heart failure and type 2 diabetes mellitus.Patient reported 5 days of worsening dyspnea, more severe over the last 48 hours, it has been refractive to oral diuretic therapy as an outpatient.  Clinical Impression  *The patient is very pleasant, legally blind. Patient ambulated very slowly with rest breaks during a  45' walk using his cane.Marland Kitchen Spo2 on RA 95%.It is recommended that patient use his  Rollator if able in home- patient is used to [facility] and "feeling" his way around his apartment. Pt admitted with above diagnosis.  Pt currently with functional limitations due to the deficits listed below (see PT Problem List). Pt will benefit from skilled PT to increase their independence and safety with mobility to allow discharge to the venue listed below.        Follow Up Recommendations Home health PT    Equipment Recommendations  (needs a new single point cane-his is bent)    Recommendations for Other Services       Precautions / Restrictions Precautions Precautions: Fall Precaution Comments: legally blind Restrictions Weight Bearing Restrictions: No      Mobility  Bed Mobility Overal bed mobility: Modified Independent             General bed mobility comments: able to transfer to side of bed with increased time and use of bed rails.  Transfers Overall transfer level: Needs assistance   Transfers: Sit to/from Stand Sit to Stand: Min guard         General transfer comment: from bed, wide base with cane  Ambulation/Gait Ambulation/Gait assistance: Min assist Gait Distance (Feet): 45 Feet Assistive device: 1 person hand held  assist;Straight cane Gait Pattern/deviations: Decreased step length - left;Decreased step length - right;Wide base of support Gait velocity: decr   General Gait Details: gait very slow, stopped x 2 for spo2 check-95% on RA.   Required verbal and tactile cues for ambulation due to visual deficits.   Stairs            Wheelchair Mobility    Modified Rankin (Stroke Patients Only)       Balance Overall balance assessment: Mild deficits observed, not formally tested                                           Pertinent Vitals/Pain Pain Assessment: No/denies pain    Home Living Family/patient expects to be discharged to:: Private residence Living Arrangements: Spouse/significant other Available Help at Discharge: Family Type of Home: Apartment Home Access: Stairs to enter   [employer] of Steps: 3 steps in front, 2 in back with rails. Home Layout: One level Home Equipment: Walker - 2 wheels;Walker - 4 wheels;Cane - single point      Prior Function Level of Independence: Needs assistance   Gait / Transfers Assistance Needed: ambulates with cane. Reports independence in home. Needs assistance out of home due to blindness  ADL's / Homemaking Assistance Needed: independent with ADLs with set up. Reports sometimes needing assistance for shoes and socks.        Hand Dominance   Dominant Hand: Right  Extremity/Trunk Assessment   Upper Extremity Assessment Upper Extremity Assessment: Overall WFL for tasks assessed    Lower Extremity Assessment Lower Extremity Assessment: Generalized weakness    Cervical / Trunk Assessment Cervical / Trunk Assessment: Normal  Communication   Communication: No difficulties  Cognition Arousal/Alertness: Awake/alert Behavior During Therapy: WFL for tasks assessed/performed Overall Cognitive Status: Within Functional Limits for tasks assessed                                         General Comments General comments (skin integrity, edema, etc.): reliant on cane    Exercises     Assessment/Plan    PT Assessment Patient needs continued PT services  PT Problem List Decreased strength;Decreased balance;Decreased knowledge of precautions;Decreased mobility;Decreased activity tolerance;Decreased safety awareness       PT Treatment Interventions DME instruction;Therapeutic activities;Gait training;Therapeutic exercise;Patient/family education;Functional mobility training    PT Goals (Current goals can be found in the Care Plan section)  Acute Rehab PT Goals Patient Stated Goal: To improve endurance PT Goal Formulation: With patient Time For Goal Achievement: 09/28/19 Potential to Achieve Goals: Good    Frequency Min 3X/week   Barriers to discharge        Co-evaluation   Reason for Co-Treatment: For patient/therapist safety PT goals addressed during session: Mobility/safety with mobility OT goals addressed during session: ADL's and self-care       AM-PAC PT "6 Clicks" Mobility  Outcome Measure Help needed turning from your back to your side while in a flat bed without using bedrails?: None Help needed moving from lying on your back to sitting on the side of a flat bed without using bedrails?: None Help needed moving to and from a bed to a chair (including a wheelchair)?: A Little Help needed standing up from a chair using your arms (e.g., wheelchair or bedside chair)?: A Little Help needed to walk in hospital room?: A Lot Help needed climbing 3-5 steps with a railing? : A Lot 6 Click Score: 18    End of Session   Activity Tolerance: Patient limited by fatigue Patient left: in chair;with call bell/phone within reach Nurse Communication: Mobility status PT Visit Diagnosis: Unsteadiness on feet (R26.81);Difficulty in walking, not elsewhere classified (R26.2)    Time: 6734-1937 PT Time Calculation (min) (ACUTE ONLY): 29 min   Charges:   PT  Evaluation $PT Eval Low Complexity: [place] PT Acute Rehabilitation Services Pager [phone] Office [phone]   Claretha Cooper 09/14/2019, 2:11 PM

## 2019-09-14 NOTE — Telephone Encounter (Signed)
Patient wife calls to speak to Dr. Nori Riis. She says [name] is being sent home from the hospital with an oxygen machine and doesn't know if Dr. Nori Riis needs to be in touch with [place] to get this set up or if the hospital will do it. Please call patient back at [phone] to discuss this.

## 2019-09-14 NOTE — Telephone Encounter (Signed)
Dear Dema Severin Team They will likely set it up at the hospital and fax me the paperwork later. THANKS! [name]

## 2019-09-14 NOTE — Evaluation (Addendum)
Occupational Therapy Evaluation Patient Details Name: [name] MRN: [id number] DOB: [birth year] Today's Date: 09/14/2019    History of Present Illness 59 year old male who presented with dyspnea. He does have significant past medical history for hypertension, dyslipidemia, chronic kidney disease stage III, history of CVA, obesity class III, systolic heart failure and type 2 diabetes mellitus.Patient reported 5 days of worsening dyspnea, more severe over the last 48 hours, it has been refractive to oral diuretic therapy as an outpatient.   Clinical Impression   [name] is a 59 year old man admitted to hospital with CHF and bronchitis. On evaluation patient demonstrates good strength but decreased cardiopulmonary endurance needing 2L [place], decreased activity tolerance and legal blindness resulting in decreased ability to ambulate baseline distances and decreased independence with ADLs. Patient will benefit from skilled OT services while in hospital to improve deficits and maintain abilities in order to return home at discharge.    Follow Up Recommendations  No OT follow up    Equipment Recommendations  None recommended by OT    Recommendations for Other Services       Precautions / Restrictions Precautions Precautions: Fall Precaution Comments: legally blind Restrictions Weight Bearing Restrictions: No      Mobility Bed Mobility Overal bed mobility: Modified Independent             General bed mobility comments: able to transfer to side of bed with increased time and use of bed rails.  Transfers Overall transfer level: Needs assistance               General transfer comment: Min assist to ambulate short distance for safety. Patient using cane and ambulating very slowly. Limited by decreased activity tolerance and blindness.    Balance Overall balance assessment: Mild deficits observed, not formally tested                                          ADL either performed or assessed with clinical judgement   ADL Overall ADL's : Needs assistance/impaired Eating/Feeding: Set up   Grooming: Set up   Upper Body Bathing: Set up   Lower Body Bathing: +2 for physical assistance;Sit to/from stand   Upper Body Dressing : Set up   Lower Body Dressing: Minimal assistance;Sit to/from stand;Min guard;Set up   Toilet Transfer: Minimal assistance;Ambulation;Regular Museum/gallery exhibitions officer and Hygiene: Min guard;Sit to/from stand   Tub/ Shower Transfer: Minimal assistance;Shower seat   Functional mobility during ADLs: Minimal assistance;Cane       Vision Baseline Vision/History: Glaucoma;Cataracts Patient Visual Report: No change from baseline;Other (comment)(legally blind)       Perception     Praxis      Pertinent Vitals/Pain Pain Assessment: No/denies pain     Hand Dominance Right   Extremity/Trunk Assessment Upper Extremity Assessment Upper Extremity Assessment: Overall WFL for tasks assessed   Lower Extremity Assessment Lower Extremity Assessment: Defer to PT evaluation   Cervical / Trunk Assessment Cervical / Trunk Assessment: Normal   Communication Communication Communication: No difficulties   Cognition Arousal/Alertness: Awake/alert Behavior During Therapy: WFL for tasks assessed/performed Overall Cognitive Status: Within Functional Limits for tasks assessed                                     General Comments  Exercises     Shoulder Instructions      Home Living Family/patient expects to be discharged to:: Private residence Living Arrangements: Spouse/significant other Available Help at Discharge: Family Type of Home: Apartment Home Access: Stairs to enter [employer] of Steps: 3 steps in front, 2 in back with rails.   Home Layout: One level     Bathroom Shower/Tub: Tub/shower unit         Home Equipment: Walker - 2  wheels;Walker - 4 wheels;Cane - single point          Prior Functioning/Environment Level of Independence: Needs assistance  Gait / Transfers Assistance Needed: ambulates with cane. Reports independence in home. Needs assistance out of home due to blindness ADL's / Homemaking Assistance Needed: independent with ADLs with set up. Reports sometimes needing assistance for shoes and socks.            OT Problem List: Cardiopulmonary status limiting activity;Decreased activity tolerance;Impaired balance (sitting and/or standing);Impaired vision/perception      OT Treatment/Interventions: Self-care/ADL training;Therapeutic activities;Balance training;Patient/family education    OT Goals(Current goals can be found in the care plan section) Acute Rehab OT Goals Patient Stated Goal: To improve endurance OT Goal Formulation: With patient Time For Goal Achievement: 09/28/19 Potential to Achieve Goals: Good  OT Frequency: Min 2X/week   Barriers to D/C:            Co-evaluation PT/OT/SLP Co-Evaluation/Treatment: Yes Reason for Co-Treatment: For patient/therapist safety PT goals addressed during session: Mobility/safety with mobility OT goals addressed during session: ADL's and self-care      AM-PAC OT "6 Clicks" Daily Activity     Outcome Measure Help from another person eating meals?: A Little Help from another person taking care of personal grooming?: A Little Help from another person toileting, which includes using toliet, bedpan, or urinal?: A Little Help from another person bathing (including washing, rinsing, drying)?: A Little Help from another person to put on and taking off regular upper body clothing?: A Little Help from another person to put on and taking off regular lower body clothing?: A Little 6 Click Score: 18   End of Session Equipment Utilized During Treatment: Other (comment)(cane, oxygen) Nurse Communication: Mobility status(left patient on RA.)  Activity  Tolerance: Patient limited by fatigue Patient left: in chair;with call bell/phone within reach  OT Visit Diagnosis: Muscle weakness (generalized) (M62.81);Unsteadiness on feet (R26.81)                Time: 5035-4656 OT Time Calculation (min): 25 min Charges:  OT General Charges $OT Visit: 1 Visit OT Evaluation $OT Eval Low Complexity: 1 Low  Meric Joye, OTR/L [place]  Office [phone]   Lenward Chancellor 09/14/2019, 11:40 AM

## 2019-09-15 ENCOUNTER — Ambulatory Visit (INDEPENDENT_AMBULATORY_CARE_PROVIDER_SITE_OTHER): Payer: Medicare Other | Admitting: *Deleted

## 2019-09-15 DIAGNOSIS — I480 Paroxysmal atrial fibrillation: Secondary | ICD-10-CM | POA: Diagnosis not present

## 2019-09-15 LAB — CBC
HCT: 38.2 % — ABNORMAL LOW (ref 39.0–52.0)
Hemoglobin: 12.8 g/dL — ABNORMAL LOW (ref 13.0–17.0)
MCH: 28.8 pg (ref 26.0–34.0)
MCHC: 33.5 g/dL (ref 30.0–36.0)
MCV: 86 fL (ref 80.0–100.0)
Platelets: 264 10*3/uL (ref 150–400)
RBC: 4.44 MIL/uL (ref 4.22–5.81)
RDW: 13.2 % (ref 11.5–15.5)
WBC: 16.7 10*3/uL — ABNORMAL HIGH (ref 4.0–10.5)
nRBC: 0 % (ref 0.0–0.2)

## 2019-09-15 LAB — GLUCOSE, CAPILLARY
Glucose-Capillary: 235 mg/dL — ABNORMAL HIGH (ref 70–99)
Glucose-Capillary: 291 mg/dL — ABNORMAL HIGH (ref 70–99)
Glucose-Capillary: 324 mg/dL — ABNORMAL HIGH (ref 70–99)
Glucose-Capillary: 278 mg/dL — ABNORMAL HIGH (ref 70–99)

## 2019-09-15 LAB — BASIC METABOLIC PANEL
Anion gap: 11 (ref 5–15)
BUN: 84 mg/dL — ABNORMAL HIGH (ref 6–20)
CO2: 19 mmol/L — ABNORMAL LOW (ref 22–32)
Calcium: 8 mg/dL — ABNORMAL LOW (ref 8.9–10.3)
Chloride: 103 mmol/L (ref 98–111)
Creatinine, Ser: 2.71 mg/dL — ABNORMAL HIGH (ref 0.61–1.24)
GFR calc Af Amer: 28 mL/min — ABNORMAL LOW (ref >60)
GFR calc non Af Amer: 24 mL/min — ABNORMAL LOW (ref >60)
Glucose, Bld: 249 mg/dL — ABNORMAL HIGH (ref 70–99)
Potassium: 4.2 mmol/L (ref 3.5–5.1)
Sodium: 133 mmol/L — ABNORMAL LOW (ref 135–145)

## 2019-09-15 MED ORDER — INSULIN ASPART 100 UNIT/ML ~~LOC~~ SOLN
5.0000 [IU] | Freq: Three times a day (TID) | SUBCUTANEOUS | Status: DC
  Administered 2019-09-15: 5 [IU] via SUBCUTANEOUS

## 2019-09-15 NOTE — Progress Notes (Signed)
PROGRESS NOTE    [name]    Code Status: Full Code  ZOX:[id number] DOB: 1960-12-11 DOA: 09/10/2019 LOS: 5 days  PCP: Dickie La, MD CC:  Chief Complaint  Patient presents with  . Shortness of Breath       Hospital Summary   This is a 59 year old male with history of hypertension, hyperlipidemia, CKD 3a, CVA, obesity, systolic heart failure, type 2 diabetes who presented with worsening dyspnea x5 days refractive to oral diuretics outpatient with positive orthopnea and PND.  Initial BP 84/65, HR 92, RR 24-27, SPO2 94% on supplemental O2 with expiratory wheeze. Chest radiograph was hypoinflated, large pulmonary artery, hilar vascular congestion with cephalization of vasculature. Chest CT with faint bilateral groundglass opacities. V/Q low probability for pulmonary embolism.  Patient was admitted for acute hypoxic respiratory failure secondary to acute on chronic systolic heart failure complicated by paroxysmal atrial fibrillation.  Also noted to have AKI.     A & P   Principal Problem:   Acute respiratory failure (HCC) Active Problems:   HYPERTENSION, BENIGN   Chronic systolic heart failure (HCC)   RENAL DISEASE, CHRONIC, STAGE III   DM (diabetes mellitus), type 2 with renal complications (HCC)   Arthritis of knee, degenerative   Automatic implantable cardioverter-defibrillator in situ   DOE (dyspnea on exertion)   1. Acute hypoxic respiratory failure secondary to acute bronchitis and complicated by paroxysmal atrial fibrillation a. Echo with improved LV systolic function up to 55 to 60% with mild LVH and mild hypokinesis of apical segment. BNP 18, unlikely an acute heart failure exacerbation with such low BNP b. Continue bronchodilators c. Completed azithromycin d. Started on systemic steroids, last day tomorrow e. Incentive spirometry and flutter valve  2. Chronic systolic heart failure with history of pacemaker a. On Entresto, Coreg, isosorbide mononitrate, Lasix  at home b. Continue I/O and daily weights c. Diuresis and Entresto have been on hold due to poor renal function  3. New onset A. fib a. CHA2DS2-VASc: 6 (CHF, hypertension, CVA, vascular disease, diabetes) b. Started on Eliquis c. Continue on carvedilol  4. AKI on CKD 3 a a. Creatinine peaked at 4.19, currently 2.71, baseline 1.6-1.9 b. Continue current management as above  5. Hypertension a. Continue carvedilol, isosorbide mono nitrate b. Holding Entresto and spironolactone  6. NAGMA, stable  7. Poorly controlled type 2 diabetes a. HA1C 10.8 b. Currently on steroids c. Continue Lantus 20 units daily, add on NovoLog 5 units 3 times daily with meals and continue sliding scale while on steroids  8. Obesity class III a. Follow-up outpatient  DVT prophylaxis: Eliquis Family Communication: Patient's wife by phone has been updated  Disposition Plan: DC pending improvement of kidney function, probably 1 to 2 days Status is: Inpatient  Remains inpatient appropriate because:Inpatient level of care appropriate due to severity of illness   Dispo: The patient is from: Home              Anticipated d/c is to: Home              Anticipated d/c date is: 2 days              Patient currently is not medically stable to d/c.     Pressure injury documentation    None  Consultants  None  Procedures  None  Antibiotics   Anti-infectives (From admission, onward)   Start     Dose/Rate Route Frequency Ordered Stop   09/12/19 1400  azithromycin (  ZITHROMAX) tablet 500 mg     Discontinue     500 mg Oral Daily 09/12/19 1334 09/17/19 0959   09/12/19 1345  azithromycin (ZITHROMAX) tablet 500 mg  Status:  Discontinued        500 mg Oral Daily 09/12/19 1331 09/12/19 1334   09/10/19 1400  cefTRIAXone (ROCEPHIN) 2 g in sodium chloride 0.9 % 100 mL IVPB  Status:  Discontinued        2 g 200 mL/hr over 30 Minutes Intravenous Every 24 hours 09/10/19 1320 09/11/19 1602   09/10/19 1400   azithromycin (ZITHROMAX) 500 mg in sodium chloride 0.9 % 250 mL IVPB  Status:  Discontinued        500 mg 250 mL/hr over 60 Minutes Intravenous Every 24 hours 09/10/19 1320 09/11/19 1602        Subjective   Patient seen and examined at bedside no acute distress and resting comfortably.  No events overnight.  Tolerating diet.  Denies any chest pain, shortness of breath, fever, nausea, vomiting, urinary complaints.  Admits to having bowel movement.  Otherwise ROS negative   Objective   Vitals:   09/14/19 2044 09/14/19 2046 09/15/19 0433 09/15/19 1429  BP: 138/79  120/88 122/80  Pulse: 61  75 68  Resp: 19  20 18   Temp: 97.9 F (36.6 C)  97.7 F (36.5 C) (!) 97.5 F (36.4 C)  TempSrc:      SpO2: 96% 96% 96% 96%  Weight:   (!) 145.2 kg   Height:        Intake/Output Summary (Last 24 hours) at 09/15/2019 1646 Last data filed at 09/15/2019 1243 Gross per 24 hour  Intake 1180 ml  Output --  Net 1180 ml   Filed Weights   09/10/19 0845 09/15/19 0433  Weight: (!) 145.2 kg (!) 145.2 kg    Examination:  Physical Exam Vitals and nursing note reviewed.  Constitutional:      Appearance: Normal appearance. He is obese.  HENT:     Head: Normocephalic and atraumatic.  Eyes:     Conjunctiva/sclera: Conjunctivae normal.  Cardiovascular:     Rate and Rhythm: Normal rate and regular rhythm.  Pulmonary:     Effort: Pulmonary effort is normal.     Breath sounds: Wheezing present.  Abdominal:     General: Abdomen is flat.     Palpations: Abdomen is soft.  Musculoskeletal:        General: No swelling or tenderness.  Skin:    Coloration: Skin is not jaundiced or pale.  Neurological:     Mental Status: He is alert. Mental status is at baseline.  Psychiatric:        Mood and Affect: Mood normal.        Behavior: Behavior normal.     Data Reviewed: I have personally reviewed following labs and imaging studies  CBC: Recent Labs  Lab 09/10/19 0834 09/10/19 0932  09/11/19 0516 09/14/19 0357 09/15/19 0840  WBC 6.8 7.7 7.6 14.3* 16.7*  HGB 13.0 13.2 11.3* 11.8* 12.8*  HCT 39.9 40.9 34.9* 36.1* 38.2*  MCV 88.5 90.9 89.3 87.4 86.0  PLT 246 231 195 247 557   Basic Metabolic Panel: Recent Labs  Lab 09/11/19 0516 09/12/19 0619 09/13/19 0451 09/14/19 0357 09/15/19 0840  NA 137 135 135 131* 133*  K 4.5 4.2 4.7 4.1 4.2  CL 106 103 103 99 103  CO2 19* 17* 18* 19* 19*  GLUCOSE 193* 186* 262* 324* 249*  BUN 50*  52* 67* 86* 84*  CREATININE 3.18* 3.13* 4.19* 3.79* 2.71*  CALCIUM 8.3* 8.2* 8.0* 7.5* 8.0*   GFR: Estimated Creatinine Clearance: 37.2 mL/min (A) (by C-G formula based on SCr of 2.71 mg/dL (H)). Liver Function Tests: No results for input(s): AST, ALT, ALKPHOS, BILITOT, PROT, ALBUMIN in the last 168 hours. No results for input(s): LIPASE, AMYLASE in the last 168 hours. No results for input(s): AMMONIA in the last 168 hours. Coagulation Profile: No results for input(s): INR, PROTIME in the last 168 hours. Cardiac Enzymes: No results for input(s): CKTOTAL, CKMB, CKMBINDEX, TROPONINI in the last 168 hours. BNP (last 3 results) No results for input(s): PROBNP in the last 8760 hours. HbA1C: No results for input(s): HGBA1C in the last 72 hours. CBG: Recent Labs  Lab 09/14/19 1134 09/14/19 1741 09/14/19 2045 09/15/19 0743 09/15/19 1121  GLUCAP 330* 284* 303* 235* 291*   Lipid Profile: No results for input(s): CHOL, HDL, LDLCALC, TRIG, CHOLHDL, LDLDIRECT in the last 72 hours. Thyroid Function Tests: No results for input(s): TSH, T4TOTAL, FREET4, T3FREE, THYROIDAB in the last 72 hours. Anemia Panel: No results for input(s): VITAMINB12, FOLATE, FERRITIN, TIBC, IRON, RETICCTPCT in the last 72 hours. Sepsis Labs: No results for input(s): PROCALCITON, LATICACIDVEN in the last 168 hours.  Recent Results (from the past 240 hour(s))  SARS CORONAVIRUS 2 (TAT 6-24 HRS) Nasopharyngeal Nasopharyngeal Swab     Status: None   Collection  Time: 09/09/19  1:36 PM   Specimen: Nasopharyngeal Swab  Result Value Ref Range Status   SARS Coronavirus 2 NEGATIVE NEGATIVE Final    Comment: (NOTE) SARS-CoV-2 target nucleic acids are NOT DETECTED. The SARS-CoV-2 RNA is generally detectable in upper and lower respiratory specimens during the acute phase of infection. Negative results do not preclude SARS-CoV-2 infection, do not rule out co-infections with other pathogens, and should not be used as the sole basis for treatment or other patient management decisions. Negative results must be combined with clinical observations, patient history, and epidemiological information. The expected result is Negative. Fact Sheet for Patients: SugarRoll.be Fact Sheet for Healthcare Providers: https://www.woods-mathews.com/ This test is not yet approved or cleared by the [place] FDA and  has been authorized for detection and/or diagnosis of SARS-CoV-2 by FDA under an Emergency Use Authorization (EUA). This EUA will remain  in effect (meaning this test can be used) for the duration of the COVID-19 declaration under Section 56 4(b)(1) of the Act, 21 U.S.C. section 360bbb-3(b)(1), unless the authorization is terminated or revoked sooner. Performed at [facility], [place] [address], [place], [place] [address]          Radiology Studies: No results found.      Scheduled Meds: . apixaban  5 mg Oral BID  . azithromycin  500 mg Oral Daily  . brimonidine  1 drop Both Eyes BID  . carvedilol  25 mg Oral BID WC  . chlorpheniramine-HYDROcodone  5 mL Oral Q12H  . cyclobenzaprine  5 mg Oral QHS  . dorzolamide-timolol  1 drop Both Eyes BID  . insulin aspart  0-20 Units Subcutaneous TID WC  . insulin aspart  0-5 Units Subcutaneous QHS  . insulin glargine  15 Units Subcutaneous Daily  . ipratropium-albuterol  3 mL Nebulization TID  . isosorbide mononitrate  20 mg Oral BID  . latanoprost  1  drop Both Eyes QPM  . methylPREDNISolone (SOLU-MEDROL) injection  40 mg Intravenous Daily  . rosuvastatin  10 mg Oral Daily   Continuous Infusions:  Time spent: 30 minutes with over 50% of the time coordinating the patient's care    Harold Hedge, DO [facility] Pager [phone]  Call night coverage person covering after 7pm

## 2019-09-15 NOTE — Telephone Encounter (Signed)
Pt wife informed of below and she wanted to let Dr. Nori Riis know that he is probably coming home tomorrow.Krishav Mamone Zimmerman Rumple, CMA

## 2019-09-16 ENCOUNTER — Telehealth: Payer: Self-pay

## 2019-09-16 LAB — BASIC METABOLIC PANEL
Anion gap: 11 (ref 5–15)
BUN: 76 mg/dL — ABNORMAL HIGH (ref 6–20)
CO2: 20 mmol/L — ABNORMAL LOW (ref 22–32)
Calcium: 7.9 mg/dL — ABNORMAL LOW (ref 8.9–10.3)
Chloride: 104 mmol/L (ref 98–111)
Creatinine, Ser: 2.43 mg/dL — ABNORMAL HIGH (ref 0.61–1.24)
GFR calc Af Amer: 32 mL/min — ABNORMAL LOW (ref >60)
GFR calc non Af Amer: 28 mL/min — ABNORMAL LOW (ref >60)
Glucose, Bld: 263 mg/dL — ABNORMAL HIGH (ref 70–99)
Potassium: 4.2 mmol/L (ref 3.5–5.1)
Sodium: 135 mmol/L (ref 135–145)

## 2019-09-16 LAB — GLUCOSE, CAPILLARY
Glucose-Capillary: 231 mg/dL — ABNORMAL HIGH (ref 70–99)
Glucose-Capillary: 213 mg/dL — ABNORMAL HIGH (ref 70–99)
Glucose-Capillary: 209 mg/dL — ABNORMAL HIGH (ref 70–99)
Glucose-Capillary: 273 mg/dL — ABNORMAL HIGH (ref 70–99)

## 2019-09-16 MED ORDER — PANTOPRAZOLE SODIUM 40 MG PO TBEC
40.0000 mg | DELAYED_RELEASE_TABLET | Freq: Two times a day (BID) | ORAL | Status: DC
  Administered 2019-09-16 – 2019-09-17 (×3): 40 mg via ORAL
  Filled 2019-09-16 (×3): qty 1

## 2019-09-16 MED ORDER — INSULIN GLARGINE 100 UNIT/ML ~~LOC~~ SOLN
20.0000 [IU] | Freq: Every day | SUBCUTANEOUS | Status: DC
  Administered 2019-09-17: 20 [IU] via SUBCUTANEOUS
  Filled 2019-09-16: qty 0.2

## 2019-09-16 MED ORDER — MOMETASONE FURO-FORMOTEROL FUM 100-5 MCG/ACT IN AERO
2.0000 | INHALATION_SPRAY | Freq: Two times a day (BID) | RESPIRATORY_TRACT | Status: DC
  Filled 2019-09-16: qty 8.8

## 2019-09-16 MED ORDER — INSULIN ASPART 100 UNIT/ML ~~LOC~~ SOLN
10.0000 [IU] | Freq: Three times a day (TID) | SUBCUTANEOUS | Status: DC
  Administered 2019-09-16: 10 [IU] via SUBCUTANEOUS

## 2019-09-16 MED ORDER — INSULIN ASPART 100 UNIT/ML ~~LOC~~ SOLN
12.0000 [IU] | Freq: Three times a day (TID) | SUBCUTANEOUS | Status: DC
  Administered 2019-09-16 – 2019-09-17 (×4): 12 [IU] via SUBCUTANEOUS

## 2019-09-16 NOTE — Progress Notes (Signed)
Inpatient Diabetes Program Recommendations  AACE/ADA: New Consensus Statement on Inpatient Glycemic Control (2015)  Target Ranges:  Prepandial:   less than 140 mg/dL      Peak postprandial:   less than 180 mg/dL (1-2 hours)      Critically ill patients:  140 - 180 mg/dL   Lab Results  Component Value Date   GLUCAP 231 (H) 09/16/2019   HGBA1C 10.8 (H) 09/10/2019    Review of Glycemic Control  Current orders for Inpatient glycemic control: Lantus 15 units QD, Novolog 0-20 units tidwc and 0-5 units QHS + 10 units tidwc  Blood sugars today: 263, 231 mg/dL  Spoke with wife on phone about coming to [facility] to learn how to give insulin injections to pt. Wife states she cannot come today, although pt has several family members who have diabetes and can teach wife how to inject. Wife said family members use vial/syringe for insulin.  Will need to be discharged on Lantus and Novolog and will need prescription for meter and syringes.  Inpatient Diabetes Program Recommendations:     Increase Lantus to 20 units QD Increase Novolog to 12 units tidwc  Continue to follow.  Thank you. Lorenda Peck, RD, LDN, CDE Inpatient Diabetes Coordinator [phone]

## 2019-09-16 NOTE — Care Management Important Message (Signed)
Important Message  Patient Details IM Letter given to Roque Lias SW Case Manager to present to the Patient Name: [name] MRN: [id number] Date of Birth: 12-May-1960   Medicare Important Message Given:  Yes     Kerin Salen 09/16/2019, 11:32 AM

## 2019-09-16 NOTE — Progress Notes (Signed)
No ICM remote transmission received for 09/12/2019 and next ICM transmission scheduled for 10/03/2019.

## 2019-09-16 NOTE — Progress Notes (Signed)
Occupational Therapy Treatment Patient Details Name: [name] MRN: [id number] DOB: 08-21-1960 Today's Date: 09/16/2019    History of present illness 59 year old male who presented with dyspnea. He does have significant past medical history for hypertension, dyslipidemia, chronic kidney disease stage III, history of CVA, obesity class III, systolic heart failure and type 2 diabetes mellitus.Patient reported 5 days of worsening dyspnea, more severe over the last 48 hours, it has been refractive to oral diuretic therapy as an outpatient.   OT comments  Treatment focused on independence with self care tasks. Patient reports he typically gets out of breathe with activity but that is his normal. O2 sats maintained between 91-92% with activity on room air. Patient demonstrated ability to ambulate in room with cane, perform toilet transfer and perform bed mobility. Patient reports he has been performing toileting independently without physical assistance. Patient has met goals or goals met /adequate for discharge home as patient reports he is at his baseline and does not want further services. No further OT needs.   Follow Up Recommendations  No OT follow up    Equipment Recommendations  None recommended by OT    Recommendations for Other Services      Precautions / Restrictions Precautions Precautions: Fall Precaution Comments: legally blind       Mobility Bed Mobility Overal bed mobility: Modified Independent             General bed mobility comments: Able to transfer to side of bed with bed rails.  Transfers Overall transfer level: Modified independent               General transfer comment: Patient ambulated to bathroom with cane and verbal cues for room negotiation. Reports decreased dyspnea and back to normal. Reports no need at baseline.    Balance Overall balance assessment: Mild deficits observed, not formally tested                                          ADL either performed or assessed with clinical judgement   ADL       Grooming: Standing;Wash/dry hands Grooming Details (indicate cue type and reason): Patient stood at sink and washed hands. Verbal cues to locate faucet (secondary to foreign environment)                 Toilet Transfer: Supervision/safety;Regular Glass blower/designer Details (indicate cue type and reason): Patient ambulated to bathroom with cane. verbal cues for room negotiation secondary to patient being legally blind - otherwise supervision.   Toileting - Clothing Manipulation Details (indicate cue type and reason): Patinet reports independence with toileting with nursing - but did not perform.             Vision       Perception     Praxis      Cognition                                                Exercises     Shoulder Instructions       General Comments      Pertinent Vitals/ Pain          Home Living  Prior Functioning/Environment              Frequency           Progress Toward Goals  OT Goals(current goals can now be found in the care plan section)  Progress towards OT goals: Goals met/education completed, patient discharged from OT  Acute Rehab OT Goals Patient Stated Goal: go home soon OT Goal Formulation: With patient Time For Goal Achievement: 09/28/19 Potential to Achieve Goals: Good  Plan All goals met and education completed, patient discharged from OT services    Co-evaluation          OT goals addressed during session: ADL's and self-care      AM-PAC OT "6 Clicks" Daily Activity     Outcome Measure   Help from another person eating meals?: A Little Help from another person taking care of personal grooming?: A Little Help from another person toileting, which includes using toliet, bedpan, or urinal?: A Little Help from another person bathing  (including washing, rinsing, drying)?: A Little Help from another person to put on and taking off regular upper body clothing?: A Little Help from another person to put on and taking off regular lower body clothing?: A Little 6 Click Score: 18    End of Session Equipment Utilized During Treatment: Other (comment) (cane)  OT Visit Diagnosis: Muscle weakness (generalized) (M62.81);Unsteadiness on feet (R26.81)   Activity Tolerance Patient tolerated treatment well   Patient Left in bed;with call bell/phone within reach;with bed alarm set   Nurse Communication  (okay to see)        Time: 3833-3832 OT Time Calculation (min): 8 min  Charges: OT General Charges $OT Visit: 1 Visit OT Treatments $Self Care/Home Management : 8-22 mins  Derl Barrow, OTR/L [place]  Office [phone] Pager: [place] 09/16/2019, 3:55 PM

## 2019-09-16 NOTE — Progress Notes (Signed)
PROGRESS NOTE    [name]    Code Status: Full Code  GMW:[id number] DOB: 06-Apr-1961 DOA: 09/10/2019 LOS: 6 days  PCP: Dickie La, MD CC:  Chief Complaint  Patient presents with  . Shortness of Breath       Hospital Summary   This is a 59 year old male with history of hypertension, hyperlipidemia, CKD 3a, CVA, obesity, systolic heart failure, type 2 diabetes who presented with worsening dyspnea x5 days refractive to oral diuretics outpatient with positive orthopnea and PND.  Initial BP 84/65, HR 92, RR 24-27, SPO2 94% on supplemental O2 with expiratory wheeze. Chest radiograph was hypoinflated, large pulmonary artery, hilar vascular congestion with cephalization of vasculature. Chest CT with faint bilateral groundglass opacities. V/Q low probability for pulmonary embolism.  Patient was admitted for acute hypoxic respiratory failure secondary to acute on chronic systolic heart failure complicated by paroxysmal atrial fibrillation.  Also noted to have AKI.     A & P   Principal Problem:   Acute respiratory failure (HCC) Active Problems:   HYPERTENSION, BENIGN   Chronic systolic heart failure (HCC)   RENAL DISEASE, CHRONIC, STAGE III   DM (diabetes mellitus), type 2 with renal complications (HCC)   Arthritis of knee, degenerative   Automatic implantable cardioverter-defibrillator in situ   DOE (dyspnea on exertion)   1. Acute hypoxic respiratory failure secondary to acute bronchitis and complicated by paroxysmal atrial fibrillation a. Echo with improved LV systolic function up to 55 to 60% with mild LVH and mild hypokinesis of apical segment. BNP 18, unlikely an acute heart failure exacerbation with such low BNP b. Continue bronchodilators, will add on protonix in case GERD component as patient is still wheezy c. Completed azithromycin d. Started on systemic steroids, last day today e. Incentive spirometry and flutter valve  2. Chronic systolic heart failure with  history of pacemaker a. On Entresto, Coreg, isosorbide mononitrate, Lasix at home b. Continue I/O and daily weights c. Diuresis and Entresto have been on hold due to poor renal function  3. New onset A. fib a. CHA2DS2-VASc: 6 (CHF, hypertension, CVA, vascular disease, diabetes) b. Started on Eliquis c. Continue on carvedilol  4. AKI on CKD 3 a a. Creatinine peaked at 4.19, currently 2.43, baseline 1.6-1.9 b. Continue current management as above, can likely discharge tomorrow if Cr is improved  5. Hypertension a. Continue carvedilol, isosorbide mono nitrate b. Holding Entresto and spironolactone  6. NAGMA, stable  7. Poorly controlled type 2 diabetes a. HA1C 10.8 b. Currently on steroids c. Continue Lantus 20 units daily, add on NovoLog 12 units 3 times daily with meals and continue sliding scale while on steroids. Monitor for hypoglycemia as he will be off steroids from today  8. Obesity class III a. Follow-up outpatient b. Body mass index is 59.07 kg/m.   DVT prophylaxis: Eliquis Family Communication: Patient's wife by phone has been updated  Disposition Plan: DC pending improvement of kidney function, probably tomorrow discharge Status is: Inpatient  Remains inpatient appropriate because:Inpatient level of care appropriate due to severity of illness   Dispo: The patient is from: Home              Anticipated d/c is to: Home              Anticipated d/c date is: 1 day              Patient currently is not medically stable to d/c.     Pressure injury documentation  None  Consultants  None  Procedures  None  Antibiotics   Anti-infectives (From admission, onward)   Start     Dose/Rate Route Frequency Ordered Stop   09/12/19 1400  azithromycin (ZITHROMAX) tablet 500 mg  Status:  Discontinued        500 mg Oral Daily 09/12/19 1334 09/15/19 1647   09/12/19 1345  azithromycin (ZITHROMAX) tablet 500 mg  Status:  Discontinued        500 mg Oral Daily 09/12/19  1331 09/12/19 1334   09/10/19 1400  cefTRIAXone (ROCEPHIN) 2 g in sodium chloride 0.9 % 100 mL IVPB  Status:  Discontinued        2 g 200 mL/hr over 30 Minutes Intravenous Every 24 hours 09/10/19 1320 09/11/19 1602   09/10/19 1400  azithromycin (ZITHROMAX) 500 mg in sodium chloride 0.9 % 250 mL IVPB  Status:  Discontinued        500 mg 250 mL/hr over 60 Minutes Intravenous Every 24 hours 09/10/19 1320 09/11/19 1602        Subjective   Patient seen and examined at bedside no acute distress and resting comfortably.  No events overnight.  Tolerating diet.  Denies any chest pain, shortness of breath, fever, nausea, vomiting, urinary complaints. Otherwise ROS negative   Objective   Vitals:   09/16/19 0457 09/16/19 0500 09/16/19 0750 09/16/19 1300  BP: 117/88   109/78  Pulse: 67   72  Resp: 16   18  Temp: (!) 97.4 F (36.3 C)   97.6 F (36.4 C)  TempSrc:    Oral  SpO2: 96%  96% 95%  Weight:  (!) 146.5 kg    Height:        Intake/Output Summary (Last 24 hours) at 09/16/2019 1527 Last data filed at 09/15/2019 1815 Gross per 24 hour  Intake 360 ml  Output --  Net 360 ml   Filed Weights   09/10/19 0845 09/15/19 0433 09/16/19 0500  Weight: (!) 145.2 kg (!) 145.2 kg (!) 146.5 kg    Examination:  Physical Exam Vitals and nursing note reviewed.  Constitutional:      Appearance: Normal appearance. He is obese.  HENT:     Head: Normocephalic and atraumatic.  Eyes:     Conjunctiva/sclera: Conjunctivae normal.  Cardiovascular:     Rate and Rhythm: Normal rate and regular rhythm.  Pulmonary:     Effort: Pulmonary effort is normal.     Breath sounds: Wheezing present.  Abdominal:     General: Abdomen is flat.     Palpations: Abdomen is soft.  Musculoskeletal:        General: No swelling or tenderness.  Skin:    Coloration: Skin is not jaundiced or pale.  Neurological:     Mental Status: He is alert. Mental status is at baseline.  Psychiatric:        Mood and Affect:  Mood normal.        Behavior: Behavior normal.     Data Reviewed: I have personally reviewed following labs and imaging studies  CBC: Recent Labs  Lab 09/10/19 0834 09/10/19 0932 09/11/19 0516 09/14/19 0357 09/15/19 0840  WBC 6.8 7.7 7.6 14.3* 16.7*  HGB 13.0 13.2 11.3* 11.8* 12.8*  HCT 39.9 40.9 34.9* 36.1* 38.2*  MCV 88.5 90.9 89.3 87.4 86.0  PLT 246 231 195 247 295   Basic Metabolic Panel: Recent Labs  Lab 09/12/19 0619 09/13/19 0451 09/14/19 0357 09/15/19 0840 09/16/19 0359  NA 135 135 131* 133*  135  K 4.2 4.7 4.1 4.2 4.2  CL 103 103 99 103 104  CO2 17* 18* 19* 19* 20*  GLUCOSE 186* 262* 324* 249* 263*  BUN 52* 67* 86* 84* 76*  CREATININE 3.13* 4.19* 3.79* 2.71* 2.43*  CALCIUM 8.2* 8.0* 7.5* 8.0* 7.9*   GFR: Estimated Creatinine Clearance: 41.8 mL/min (A) (by C-G formula based on SCr of 2.43 mg/dL (H)). Liver Function Tests: No results for input(s): AST, ALT, ALKPHOS, BILITOT, PROT, ALBUMIN in the last 168 hours. No results for input(s): LIPASE, AMYLASE in the last 168 hours. No results for input(s): AMMONIA in the last 168 hours. Coagulation Profile: No results for input(s): INR, PROTIME in the last 168 hours. Cardiac Enzymes: No results for input(s): CKTOTAL, CKMB, CKMBINDEX, TROPONINI in the last 168 hours. BNP (last 3 results) No results for input(s): PROBNP in the last 8760 hours. HbA1C: No results for input(s): HGBA1C in the last 72 hours. CBG: Recent Labs  Lab 09/15/19 1121 09/15/19 1653 09/15/19 2103 09/16/19 0752 09/16/19 1154  GLUCAP 291* 324* 278* 231* 213*   Lipid Profile: No results for input(s): CHOL, HDL, LDLCALC, TRIG, CHOLHDL, LDLDIRECT in the last 72 hours. Thyroid Function Tests: No results for input(s): TSH, T4TOTAL, FREET4, T3FREE, THYROIDAB in the last 72 hours. Anemia Panel: No results for input(s): VITAMINB12, FOLATE, FERRITIN, TIBC, IRON, RETICCTPCT in the last 72 hours. Sepsis Labs: No results for input(s):  PROCALCITON, LATICACIDVEN in the last 168 hours.  Recent Results (from the past 240 hour(s))  SARS CORONAVIRUS 2 (TAT 6-24 HRS) Nasopharyngeal Nasopharyngeal Swab     Status: None   Collection Time: 09/09/19  1:36 PM   Specimen: Nasopharyngeal Swab  Result Value Ref Range Status   SARS Coronavirus 2 NEGATIVE NEGATIVE Final    Comment: (NOTE) SARS-CoV-2 target nucleic acids are NOT DETECTED. The SARS-CoV-2 RNA is generally detectable in upper and lower respiratory specimens during the acute phase of infection. Negative results do not preclude SARS-CoV-2 infection, do not rule out co-infections with other pathogens, and should not be used as the sole basis for treatment or other patient management decisions. Negative results must be combined with clinical observations, patient history, and epidemiological information. The expected result is Negative. Fact Sheet for Patients: SugarRoll.be Fact Sheet for Healthcare Providers: https://www.woods-mathews.com/ This test is not yet approved or cleared by the [place] FDA and  has been authorized for detection and/or diagnosis of SARS-CoV-2 by FDA under an Emergency Use Authorization (EUA). This EUA will remain  in effect (meaning this test can be used) for the duration of the COVID-19 declaration under Section 56 4(b)(1) of the Act, 21 U.S.C. section 360bbb-3(b)(1), unless the authorization is terminated or revoked sooner. Performed at [facility], [place] [address], [place], [place] [address]          Radiology Studies: No results found.      Scheduled Meds: . apixaban  5 mg Oral BID  . brimonidine  1 drop Both Eyes BID  . carvedilol  25 mg Oral BID WC  . chlorpheniramine-HYDROcodone  5 mL Oral Q12H  . cyclobenzaprine  5 mg Oral QHS  . dorzolamide-timolol  1 drop Both Eyes BID  . insulin aspart  0-20 Units Subcutaneous TID WC  . insulin aspart  0-5 Units Subcutaneous QHS    . insulin aspart  12 Units Subcutaneous TID WC  . [START ON 09/17/2019] insulin glargine  20 Units Subcutaneous Daily  . ipratropium-albuterol  3 mL Nebulization TID  . isosorbide  mononitrate  20 mg Oral BID  . latanoprost  1 drop Both Eyes QPM  . pantoprazole  40 mg Oral BID  . rosuvastatin  10 mg Oral Daily   Continuous Infusions:   Time spent: 25 minutes with over 50% of the time coordinating the patient's care    Harold Hedge, DO [facility] Pager [phone]  Call night coverage person covering after 7pm

## 2019-09-16 NOTE — Progress Notes (Signed)
Physical Therapy Treatment Patient Details Name: [name] MRN: [id number] DOB: July 20, 1960 Today's Date: 09/16/2019    History of Present Illness 59 year old male who presented with dyspnea. He does have significant past medical history for hypertension, dyslipidemia, chronic kidney disease stage III, history of CVA, obesity class III, systolic heart failure and type 2 diabetes mellitus.Patient reported 5 days of worsening dyspnea, more severe over the last 48 hours, it has been refractive to oral diuretic therapy as an outpatient.    PT Comments    Pt was OOB in bathroom with RN holding to IV pole.  Assisted with amb.  General Gait Details: using cane on right and hand held on left with VC's to navigate around furniture and through doorway.  Very short, suffled steps.  Tolerated distance well.  Mild dyspnea. Pt declined to try Rollator.  "I like my cane". General transfer comment: VC's to navigate due to visual impairement.  Pt prefers to use his cane with R hand and "touch sense" with left.  Good safety cognition and slow/steady. Assisted back to bed per pt request.  Pt hopes to go home tomorrow and be back in his familiar environment.   Follow Up Recommendations  Home health PT     Equipment Recommendations  Cane    Recommendations for Other Services       Precautions / Restrictions Precautions Precautions: Fall Precaution Comments: legally blind Restrictions Weight Bearing Restrictions: No    Mobility  Bed Mobility Overal bed mobility: Modified Independent             General bed mobility comments: OOB in bathroom with RN  Transfers Overall transfer level: Needs assistance Equipment used: Straight cane Transfers: Sit to/from Stand Sit to Stand: Supervision         General transfer comment: VC's to navigate due to visual impairement.  Pt prefers to use his cane with R hand and "touch sense" with left.  Good safety cognition and  slow/steady.  Ambulation/Gait Ambulation/Gait assistance: Supervision;Min guard Gait Distance (Feet): 38 Feet Assistive device: 1 person hand held assist;Straight cane Gait Pattern/deviations: Decreased step length - left;Decreased step length - right;Wide base of support Gait velocity: decr   General Gait Details: using cane on right and hand held on left with VC's to navigate around furniture and through doorway.  Very short, suffled steps.  Tolerated distance well.  Mild dyspnea.   Stairs             Wheelchair Mobility    Modified Rankin (Stroke Patients Only)       Balance Overall balance assessment: Mild deficits observed, not formally tested                                          Cognition Arousal/Alertness: Awake/alert Behavior During Therapy: WFL for tasks assessed/performed Overall Cognitive Status: Within Functional Limits for tasks assessed                                 General Comments: AxO x 4 pleasant      Exercises      General Comments        Pertinent Vitals/Pain      Home Living                      Prior Function  PT Goals (current goals can now be found in the care plan section) Acute Rehab PT Goals Patient Stated Goal: go home soon Progress towards PT goals: Progressing toward goals    Frequency    Min 3X/week      PT Plan Current plan remains appropriate    Co-evaluation       OT goals addressed during session: ADL's and self-care      AM-PAC PT "6 Clicks" Mobility   Outcome Measure  Help needed turning from your back to your side while in a flat bed without using bedrails?: None Help needed moving from lying on your back to sitting on the side of a flat bed without using bedrails?: None Help needed moving to and from a bed to a chair (including a wheelchair)?: A Little Help needed standing up from a chair using your arms (e.g., wheelchair or bedside  chair)?: A Little Help needed to walk in hospital room?: A Little Help needed climbing 3-5 steps with a railing? : A Little 6 Click Score: 20    End of Session Equipment Utilized During Treatment: Gait belt Activity Tolerance: Patient tolerated treatment well Patient left: in bed;with call bell/phone within reach Nurse Communication: Mobility status PT Visit Diagnosis: Unsteadiness on feet (R26.81);Difficulty in walking, not elsewhere classified (R26.2)     Time: 1341-1406 PT Time Calculation (min) (ACUTE ONLY): 25 min  Charges:  $Gait Training: 8-22 mins $Therapeutic Activity: 8-22 mins                     Rica Koyanagi  PTA Acute  Rehabilitation Services Pager      [phone] Office      [phone]

## 2019-09-16 NOTE — Telephone Encounter (Signed)
Spoke with wife Aldona Bar to remind of missed remote transmission. Patient is currently admitted and expected to be discharged this weekend.

## 2019-09-17 DIAGNOSIS — I4891 Unspecified atrial fibrillation: Secondary | ICD-10-CM

## 2019-09-17 DIAGNOSIS — E1121 Type 2 diabetes mellitus with diabetic nephropathy: Secondary | ICD-10-CM

## 2019-09-17 LAB — BASIC METABOLIC PANEL
Anion gap: 10 (ref 5–15)
BUN: 60 mg/dL — ABNORMAL HIGH (ref 6–20)
CO2: 21 mmol/L — ABNORMAL LOW (ref 22–32)
Calcium: 8.7 mg/dL — ABNORMAL LOW (ref 8.9–10.3)
Chloride: 107 mmol/L (ref 98–111)
Creatinine, Ser: 2.18 mg/dL — ABNORMAL HIGH (ref 0.61–1.24)
GFR calc Af Amer: 37 mL/min — ABNORMAL LOW (ref >60)
GFR calc non Af Amer: 32 mL/min — ABNORMAL LOW (ref >60)
Glucose, Bld: 197 mg/dL — ABNORMAL HIGH (ref 70–99)
Potassium: 4.8 mmol/L (ref 3.5–5.1)
Sodium: 138 mmol/L (ref 135–145)

## 2019-09-17 LAB — GLUCOSE, CAPILLARY
Glucose-Capillary: 195 mg/dL — ABNORMAL HIGH (ref 70–99)
Glucose-Capillary: 222 mg/dL — ABNORMAL HIGH (ref 70–99)

## 2019-09-17 MED ORDER — NOVOLOG FLEXPEN 100 UNIT/ML ~~LOC~~ SOPN: 5.0000 [IU] | PEN_INJECTOR | Freq: Three times a day (TID) | SUBCUTANEOUS | 1 refills | Status: DC

## 2019-09-17 MED ORDER — BLOOD GLUCOSE METER KIT: PACK | 0 refills | Status: DC

## 2019-09-17 MED ORDER — APIXABAN 5 MG PO TABS: 5.0000 mg | ORAL_TABLET | Freq: Two times a day (BID) | ORAL | 1 refills | Status: DC

## 2019-09-17 MED ORDER — PANTOPRAZOLE SODIUM 40 MG PO TBEC: 40.0000 mg | DELAYED_RELEASE_TABLET | Freq: Two times a day (BID) | ORAL | 0 refills | Status: DC

## 2019-09-17 MED ORDER — LANTUS SOLOSTAR 100 UNIT/ML ~~LOC~~ SOPN: 15.0000 [IU] | PEN_INJECTOR | Freq: Every day | SUBCUTANEOUS | 1 refills | Status: DC

## 2019-09-17 MED ORDER — INSULIN PEN NEEDLE 32G X 4 MM MISC: 1.0000 [IU] | Freq: Four times a day (QID) | 1 refills | Status: AC

## 2019-09-17 NOTE — Progress Notes (Signed)
Inpatient Diabetes Program Recommendations  AACE/ADA: New Consensus Statement on Inpatient Glycemic Control (2015)  Target Ranges:  Prepandial:   less than 140 mg/dL      Peak postprandial:   less than 180 mg/dL (1-2 hours)      Critically ill patients:  140 - 180 mg/dL   Lab Results  Component Value Date   GLUCAP 222 (H) 09/17/2019   HGBA1C 10.8 (H) 09/10/2019    Spoke with wife over the phone regarding insulin regimen, blood glucose checks, when to call their PCP while on steroid taper and insulin.  Sent DM educational videos via email ([email or web address]) to wife to explain in detail how to operate and administer insulin with insulin pen, how to check CBG, and what to do for hypoglycemia.  Wife reports just having hysterectomy surgery.  Wife appreciates information given.  Thanks, Tama Headings RN, MSN, BC-ADM Inpatient Diabetes Coordinator Team Pager [phone] (8a-5p)

## 2019-09-17 NOTE — TOC Progression Note (Signed)
Transition of Care [facility]) - Progression Note    Patient Details  Name: [name] MRN: [id number] Date of Birth: 20-Oct-1960  [facility] of [facility]) CM/SW Contact  Joaquin Courts, RN Phone Number: 09/17/2019, 1:43 PM  Clinical Narrative:  Referral given to [employer] rep for Rollator, equipment to be delivered to bedside.     Expected Discharge Plan: Home/Self Care Barriers to Discharge: No Barriers Identified  Expected Discharge Plan and Services Expected Discharge Plan: Home/Self Care   Discharge Planning Services: CM Consult Post Acute Care Choice: Durable Medical Equipment Living arrangements for the past 2 months: Apartment Expected Discharge Date: 09/17/19               DME Arranged: Gilford Rile rolling with seat DME Agency: [employer] Date DME Agency Contacted: 09/17/19 Time DME Agency Contacted: [phone] Representative spoke with at DME Agency: [place]             Social Determinants of Health ([place]) Interventions    Readmission Risk Interventions No flowsheet data found.

## 2019-09-17 NOTE — Discharge Summary (Signed)
Physician Discharge Summary  [name] HYQ:[id number] DOB: Dec 06, 1960   PCP: Dickie La, MD  Admit date: 09/10/2019 Discharge date: 09/17/2019 Length of Stay: 7 days   Code Status: Full Code  Admitted From:  Home Discharged to:   [place]: PT  Equipment/Devices: cane/Rollator Discharge Condition:  Stable  Recommendations for Outpatient Follow-up   1. Follow up with PCP in 1 week 2. Follow up BMP/CBC  3. Started on Eliquis for new onset A. fib 4. Started on insulin for poorly controlled diabetes, follow-up glucose recordings 5. Renal function improved but not resolved.  Spironolactone and Entresto held at discharge pending further lab work.  Please reevaluate renal function and restart these medications if renal function has resolved. 6. Consider repeat PFTs as he had spirometry in 2016 which was unremarkable  Hospital Summary  This is a 59 year old male with history of hypertension, hyperlipidemia, CKD 3a, CVA, obesity, systolic heart failure, type 2 diabetes who presented with worsening dyspnea x5 days refractive to oral diuretics outpatient with positive orthopnea and PND.  Initial BP 84/65, HR 92, RR 24-27, SPO2 94% on supplemental O2 with expiratory wheeze. Chest radiograph was hypoinflated, large pulmonary artery, hilar vascular congestion with cephalization of vasculature. Chest CT with faint bilateral groundglass opacities. V/Q low probability for pulmonary embolism.  Patient was admitted for acute hypoxic respiratory failure initially thought secondary to acute on chronic systolic heart failure complicated by paroxysmal atrial fibrillation, more likely bronchitis after further evaluation.  Also noted to have AKI.  Throughout the course of his hospitalization he continued to have wheezing and there was concern for bronchitis.  He was started on Solu-Medrol and azithromycin which she completed 5 days therapy.  Continued to have wheezing and was started on Protonix for  suspected GERD component and he had improved wheezing prior to discharge.  Additionally, he had a prolonged hospitalization due to his AKI.  Creatinine peaked at 4.19, baseline 1.6-1.9 and 2.18 at discharge.  This improved with holding his nephrotoxic medications.  Continue to hold Entresto and spironolactone at discharge.  Please reevaluate this in the office once he has repeat lab work and resolution of his renal function  He was also noted to have an HA1C of 10.8 which is over 4 points higher than 1 year ago.  He was seen by the diabetic coordinator and was started on Lantus 15 units daily and NovoLog 5 units 3 times daily with meals and continue glipizide.  Advised to monitor for hypoglycemia and bring glucose recording on his next PCP appointment.  A & P   Principal Problem:   Acute respiratory failure (HCC) Active Problems:   HYPERTENSION, BENIGN   Chronic systolic heart failure (HCC)   RENAL DISEASE, CHRONIC, STAGE III   DM (diabetes mellitus), type 2 with renal complications (HCC)   Arthritis of knee, degenerative   Automatic implantable cardioverter-defibrillator in situ   DOE (dyspnea on exertion)    1. Acute hypoxic respiratory failure secondary to acute bronchitis and complicated by paroxysmal atrial fibrillation a. Echo with improved LV systolic function up to 55 to 60% with mild LVH and mild hypokinesis of apical segment. BNP 18, unlikely an acute heart failure exacerbation with such low BNP b. Added on protonix in case GERD component which seemed to improve his symptoms c. Completed azithromycin and steroids d. Follow-up respiratory function outpatient and consider PFTs e. Likely a component of obesity hypoventilation syndrome  2. Chronic systolic heart failure with history of pacemaker a. On Entresto,  Coreg, isosorbide mononitrate, Lasix at home b. Entresto and spironolactone held at discharge pending BMP c. Can restart Lasix in the next day or so  3. New onset A.  fib a. CHA2DS2-VASc: 6 (CHF, hypertension, CVA, vascular disease, diabetes) b. Started on Eliquis c. Continue on carvedilol  4. AKI on CKD 3 a a. Creatinine peaked at 4.19, currently 2.18, baseline 1.6-1.9 b. Plan as above  5. Hypertension a. Continue carvedilol, isosorbide mono nitrate b. Holding Entresto and spironolactone  6. NAGMA, stable  7. Poorly controlled type 2 diabetes a. HA1C 10.8 b. Hyperglycemia while inpatient due to steroids c. Started on Lantus 15 units daily and NovoLog 5 units 3 times daily with meals and continued home oral glycemic agents  8. Obesity class III a. Follow-up outpatient b. Body mass index is 59.07 kg/m.      Consultants  . None  Procedures  . None  Antibiotics   Anti-infectives (From admission, onward)   Start     Dose/Rate Route Frequency Ordered Stop   09/12/19 1400  azithromycin (ZITHROMAX) tablet 500 mg  Status:  Discontinued        500 mg Oral Daily 09/12/19 1334 09/15/19 1647   09/12/19 1345  azithromycin (ZITHROMAX) tablet 500 mg  Status:  Discontinued        500 mg Oral Daily 09/12/19 1331 09/12/19 1334   09/10/19 1400  cefTRIAXone (ROCEPHIN) 2 g in sodium chloride 0.9 % 100 mL IVPB  Status:  Discontinued        2 g 200 mL/hr over 30 Minutes Intravenous Every 24 hours 09/10/19 1320 09/11/19 1602   09/10/19 1400  azithromycin (ZITHROMAX) 500 mg in sodium chloride 0.9 % 250 mL IVPB  Status:  Discontinued        500 mg 250 mL/hr over 60 Minutes Intravenous Every 24 hours 09/10/19 1320 09/11/19 1602       Subjective  Patient seen and examined at bedside no acute distress and resting comfortably.  No events overnight.  Tolerating diet. In good spirits and anticipating discharge.   Denies any chest pain, shortness of breath, fever, nausea, vomiting, urinary or bowel complaints. Otherwise ROS negative    Objective   Discharge Exam: Vitals:   09/17/19 0513 09/17/19 0743  BP: (!) 136/97   Pulse: 64   Resp: 16    Temp: 97.6 F (36.4 C)   SpO2: 96% 95%   Vitals:   09/16/19 2059 09/17/19 0500 09/17/19 0513 09/17/19 0743  BP: 124/82  (!) 136/97   Pulse: 61  64   Resp: 16  16   Temp: (!) 97.4 F (36.3 C)  97.6 F (36.4 C)   TempSrc:      SpO2: 93%  96% 95%  Weight:  (!) 145.8 kg    Height:        Physical Exam Vitals and nursing note reviewed.  Constitutional:      Appearance: Normal appearance. He is obese.  HENT:     Head: Normocephalic and atraumatic.  Eyes:     Conjunctiva/sclera: Conjunctivae normal.  Cardiovascular:     Rate and Rhythm: Normal rate and regular rhythm.  Pulmonary:     Effort: Pulmonary effort is normal.     Breath sounds: Normal breath sounds. No wheezing.  Abdominal:     General: Abdomen is flat.     Palpations: Abdomen is soft.  Musculoskeletal:        General: No swelling or tenderness.  Skin:    Coloration: Skin is  not jaundiced or pale.  Neurological:     Mental Status: He is alert. Mental status is at baseline.  Psychiatric:        Mood and Affect: Mood normal.        Behavior: Behavior normal.       The results of significant diagnostics from this hospitalization (including imaging, microbiology, ancillary and laboratory) are listed below for reference.     Microbiology: Recent Results (from the past 240 hour(s))  SARS CORONAVIRUS 2 (TAT 6-24 HRS) Nasopharyngeal Nasopharyngeal Swab     Status: None   Collection Time: 09/09/19  1:36 PM   Specimen: Nasopharyngeal Swab  Result Value Ref Range Status   SARS Coronavirus 2 NEGATIVE NEGATIVE Final    Comment: (NOTE) SARS-CoV-2 target nucleic acids are NOT DETECTED. The SARS-CoV-2 RNA is generally detectable in upper and lower respiratory specimens during the acute phase of infection. Negative results do not preclude SARS-CoV-2 infection, do not rule out co-infections with other pathogens, and should not be used as the sole basis for treatment or other patient management decisions. Negative  results must be combined with clinical observations, patient history, and epidemiological information. The expected result is Negative. Fact Sheet for Patients: SugarRoll.be Fact Sheet for Healthcare Providers: https://www.woods-mathews.com/ This test is not yet approved or cleared by the [place] FDA and  has been authorized for detection and/or diagnosis of SARS-CoV-2 by FDA under an Emergency Use Authorization (EUA). This EUA will remain  in effect (meaning this test can be used) for the duration of the COVID-19 declaration under Section 56 4(b)(1) of the Act, 21 U.S.C. section 360bbb-3(b)(1), unless the authorization is terminated or revoked sooner. Performed at [facility], [place] [address], [place], [place] [address]      Labs: BNP (last 3 results) Recent Labs    09/10/19 0954  BNP 82.9   Basic Metabolic Panel: Recent Labs  Lab 09/13/19 0451 09/14/19 0357 09/15/19 0840 09/16/19 0359 09/17/19 0550  NA 135 131* 133* 135 138  K 4.7 4.1 4.2 4.2 4.8  CL 103 99 103 104 107  CO2 18* 19* 19* 20* 21*  GLUCOSE 262* 324* 249* 263* 197*  BUN 67* 86* 84* 76* 60*  CREATININE 4.19* 3.79* 2.71* 2.43* 2.18*  CALCIUM 8.0* 7.5* 8.0* 7.9* 8.7*   Liver Function Tests: No results for input(s): AST, ALT, ALKPHOS, BILITOT, PROT, ALBUMIN in the last 168 hours. No results for input(s): LIPASE, AMYLASE in the last 168 hours. No results for input(s): AMMONIA in the last 168 hours. CBC: Recent Labs  Lab 09/11/19 0516 09/14/19 0357 09/15/19 0840  WBC 7.6 14.3* 16.7*  HGB 11.3* 11.8* 12.8*  HCT 34.9* 36.1* 38.2*  MCV 89.3 87.4 86.0  PLT 195 247 264   Cardiac Enzymes: No results for input(s): CKTOTAL, CKMB, CKMBINDEX, TROPONINI in the last 168 hours. BNP: Invalid input(s): POCBNP CBG: Recent Labs  Lab 09/16/19 1154 09/16/19 1635 09/16/19 2100 09/17/19 0735 09/17/19 1153  GLUCAP 213* 209* 273* 195* 222*   D-Dimer No  results for input(s): DDIMER in the last 72 hours. Hgb A1c No results for input(s): HGBA1C in the last 72 hours. Lipid Profile No results for input(s): CHOL, HDL, LDLCALC, TRIG, CHOLHDL, LDLDIRECT in the last 72 hours. Thyroid function studies No results for input(s): TSH, T4TOTAL, T3FREE, THYROIDAB in the last 72 hours.  Invalid input(s): FREET3 Anemia work up No results for input(s): VITAMINB12, FOLATE, FERRITIN, TIBC, IRON, RETICCTPCT in the last 72 hours. Urinalysis    Component  Value Date/Time   COLORURINE YELLOW 07/03/2010 1022   APPEARANCEUR CLEAR 07/03/2010 1022   LABSPEC 1.011 07/03/2010 1022   PHURINE 5.5 07/03/2010 1022   GLUCOSEU NEGATIVE 07/03/2010 1022   HGBUR NEGATIVE 07/03/2010 1022   BILIRUBINUR NEG 04/01/2012 1020   KETONESUR NEGATIVE 07/03/2010 1022   PROTEINUR NEG 04/01/2012 1020   PROTEINUR NEGATIVE 07/03/2010 1022   UROBILINOGEN 0.2 04/01/2012 1020   UROBILINOGEN 0.2 07/03/2010 1022   NITRITE NEG 04/01/2012 1020   NITRITE NEGATIVE 07/03/2010 1022   LEUKOCYTESUR Negative 04/01/2012 1020   Sepsis Labs Invalid input(s): PROCALCITONIN,  WBC,  LACTICIDVEN Microbiology Recent Results (from the past 240 hour(s))  SARS CORONAVIRUS 2 (TAT 6-24 HRS) Nasopharyngeal Nasopharyngeal Swab     Status: None   Collection Time: 09/09/19  1:36 PM   Specimen: Nasopharyngeal Swab  Result Value Ref Range Status   SARS Coronavirus 2 NEGATIVE NEGATIVE Final    Comment: (NOTE) SARS-CoV-2 target nucleic acids are NOT DETECTED. The SARS-CoV-2 RNA is generally detectable in upper and lower respiratory specimens during the acute phase of infection. Negative results do not preclude SARS-CoV-2 infection, do not rule out co-infections with other pathogens, and should not be used as the sole basis for treatment or other patient management decisions. Negative results must be combined with clinical observations, patient history, and epidemiological information. The expected result  is Negative. Fact Sheet for Patients: SugarRoll.be Fact Sheet for Healthcare Providers: https://www.woods-mathews.com/ This test is not yet approved or cleared by the [place] FDA and  has been authorized for detection and/or diagnosis of SARS-CoV-2 by FDA under an Emergency Use Authorization (EUA). This EUA will remain  in effect (meaning this test can be used) for the duration of the COVID-19 declaration under Section 56 4(b)(1) of the Act, 21 U.S.C. section 360bbb-3(b)(1), unless the authorization is terminated or revoked sooner. Performed at [facility], [place] [address], [place], [place] [address]     Discharge Instructions     Discharge Instructions    Diet - low sodium heart healthy   Complete by: As directed    Diet Carb Modified   Complete by: As directed    Discharge instructions   Complete by: As directed    - Do not take Spironolactone or Entresto for the next 2 days. Restart these medications on Monday - Follow up with your primary care physician this week, preferably by Wednesday with lab work - Start taking Protonix twice daily for acid reflux which could have been worsening your cough - Start taking Lantus 15 units daily and Novolog 5 units three times daily with meals. Check your blood sugar three times daily and bring this recording to your PCP appointment this week  - Start taking Eliquis twice daily. This is a blood thinner and puts you at risk for bleeding. Be very careful not to fall and monitor your stool for any signs of bleeding or black tarry consistency. If you notice this then contact your PCP  If you have any significant change or worsening of your symptoms do not hesitate to contact your PCP or return to the ED   Increase activity slowly   Complete by: As directed      Allergies as of 09/17/2019      Reactions   Bee Pollen Anaphylaxis, Itching, Swelling   Nsaids    CKD and CHF      Medication  List    TAKE these medications   apixaban 5 MG Tabs tablet Commonly known as:  ELIQUIS Take 1 tablet (5 mg total) by mouth 2 (two) times daily.   aspirin 81 MG tablet Take 81 mg by mouth daily.   benzonatate 100 MG capsule Commonly known as: TESSALON Take 1-2 capsules (100-200 mg total) by mouth 3 (three) times daily as needed.   blood glucose meter kit and supplies Dispense based on patient and insurance preference. Use up to four times daily as directed. (FOR ICD-10 E10.9, E11.9).   brimonidine 0.2 % ophthalmic solution Commonly known as: ALPHAGAN Place 1 drop into both eyes 2 (two) times daily.   Colchicine 0.6 MG Caps Commonly known as: Mitigare Take one by mouth daily for gout What changed:   how much to take  how to take this  when to take this  reasons to take this  additional instructions   Coreg 25 MG tablet Generic drug: carvedilol Take 1 tablet (25 mg total) by mouth 2 (two) times daily with a meal.   cyclobenzaprine 5 MG tablet Commonly known as: FLEXERIL Take 1 tablet (5 mg total) by mouth at bedtime.   dorzolamide-timolol 22.3-6.8 MG/ML ophthalmic solution Commonly known as: COSOPT Place 1 drop into both eyes 2 (two) times daily.   Entresto 49-51 MG Generic drug: sacubitril-valsartan Take 1 tablet by mouth 2 (two) times daily.   EPINEPHrine 0.3 mg/0.3 mL Soaj injection Commonly known as: EPI-PEN INJECT (0.16m) into musle AS NEEDED What changed:   how much to take  how to take this  when to take this  reasons to take this  additional instructions   furosemide 40 MG tablet Commonly known as: LASIX Take 1 tablet (40 mg total) by mouth as directed. What changed:   when to take this  reasons to take this   glimepiride 4 MG tablet Commonly known as: AMARYL Take 4 mg by mouth daily.   HYDROcodone-acetaminophen 5-325 MG tablet Commonly known as: NORCO/VICODIN Take by mouth 2 in AM 1 at midday and 2 at bedtime for chronic knee pain  OK to fill 30 days after last prescription What changed:   how much to take  how to take this  when to take this  additional instructions   Insulin Pen Needle 32G X 4 MM Misc 1 Units by Does not apply route in the morning, at noon, in the evening, and at bedtime.   isosorbide mononitrate 20 MG tablet Commonly known as: ISMO TAKE ONE TABLET BY MOUTH TWICE DAILY What changed: when to take this   Lantus SoloStar 100 UNIT/ML Solostar Pen Generic drug: insulin glargine Inject 15 Units into the skin daily.   latanoprost 0.005 % ophthalmic solution Commonly known as: XALATAN Place 1 drop into both eyes every evening.   nitroGLYCERIN 0.4 MG SL tablet Commonly known as: NITROSTAT Place 0.4 mg under the tongue as needed.   NovoLOG FlexPen 100 UNIT/ML FlexPen Generic drug: insulin aspart Inject 5 Units into the skin 3 (three) times daily with meals.   pantoprazole 40 MG tablet Commonly known as: PROTONIX Take 1 tablet (40 mg total) by mouth 2 (two) times daily.   rosuvastatin 10 MG tablet Commonly known as: Crestor Take 1 tablet (10 mg total) by mouth daily. Replaces lipitor   spironolactone 25 MG tablet Commonly known as: ALDACTONE Take 25 mg by mouth 2 (two) times daily.            Durable Medical Equipment  (From admission, onward)         Start     Ordered  09/16/19 1654  For home use only DME 4 wheeled rolling walker with seat  Once       Question:  Patient needs a walker to treat with the following condition  Answer:  Ambulatory dysfunction   09/16/19 1653   09/15/19 0728  For home use only DME Cane  Once        09/15/19 0727          Allergies  Allergen Reactions  . Bee Pollen Anaphylaxis, Itching and Swelling  . Nsaids     CKD and CHF    Dispo: The patient is from: Home              Anticipated d/c is to: Home              Anticipated d/c date is: Today              Patient currently is medically stable to d/c.       Time coordinating  discharge: Over 30 minutes   SIGNED:   Harold Hedge, D.O. [facility] Pager: [phone]  09/17/2019, 1:16 PM

## 2019-09-17 NOTE — Progress Notes (Signed)
Pt being discharged to home with wife. Discharge instructions and medication education provided to pt.

## 2019-09-19 ENCOUNTER — Other Ambulatory Visit: Payer: Self-pay | Admitting: Family Medicine

## 2019-09-19 LAB — CUP PACEART REMOTE DEVICE CHECK
Battery Remaining Longevity: 93 mo
Battery Voltage: 2.99 V
Brady Statistic RV Percent Paced: 0.01 %
Date Time Interrogation Session: 20210614080348
HighPow Impedance: 47 Ohm
HighPow Impedance: 55 Ohm
Implantable Lead Implant Date: 20081114
Implantable Lead Location: 753860
Implantable Lead Model: 6947
Implantable Pulse Generator Implant Date: 20170222
Lead Channel Impedance Value: 456 Ohm
Lead Channel Impedance Value: 513 Ohm
Lead Channel Pacing Threshold Amplitude: 0.625 V
Lead Channel Pacing Threshold Pulse Width: 0.4 ms
Lead Channel Sensing Intrinsic Amplitude: 8.125 mV
Lead Channel Sensing Intrinsic Amplitude: 8.125 mV
Lead Channel Setting Pacing Amplitude: 2.5 V
Lead Channel Setting Pacing Pulse Width: 0.4 ms
Lead Channel Setting Sensing Sensitivity: 0.3 mV

## 2019-09-19 NOTE — Telephone Encounter (Signed)
Patient's wife would like to have his hydrocodone refilled.

## 2019-09-20 ENCOUNTER — Other Ambulatory Visit: Payer: Self-pay

## 2019-09-20 DIAGNOSIS — G8929 Other chronic pain: Secondary | ICD-10-CM

## 2019-09-20 DIAGNOSIS — M170 Bilateral primary osteoarthritis of knee: Secondary | ICD-10-CM

## 2019-09-20 MED ORDER — HYDROCODONE-ACETAMINOPHEN 5-325 MG PO TABS: ORAL_TABLET | ORAL | 0 refills | Status: DC

## 2019-09-20 NOTE — Telephone Encounter (Signed)
Request sent to Dr. Nori Riis. [name], [place]

## 2019-09-21 ENCOUNTER — Other Ambulatory Visit: Payer: Self-pay

## 2019-09-21 ENCOUNTER — Encounter: Payer: Self-pay | Admitting: Family Medicine

## 2019-09-21 ENCOUNTER — Ambulatory Visit (INDEPENDENT_AMBULATORY_CARE_PROVIDER_SITE_OTHER): Payer: Medicare Other | Admitting: Family Medicine

## 2019-09-21 VITALS — BP 116/68 | HR 73 | Temp 98.5°F | Wt 320.0 lb

## 2019-09-21 DIAGNOSIS — I480 Paroxysmal atrial fibrillation: Secondary | ICD-10-CM | POA: Diagnosis not present

## 2019-09-21 DIAGNOSIS — N1832 Chronic kidney disease, stage 3b: Secondary | ICD-10-CM | POA: Diagnosis not present

## 2019-09-21 DIAGNOSIS — N183 Chronic kidney disease, stage 3 unspecified: Secondary | ICD-10-CM | POA: Diagnosis not present

## 2019-09-21 DIAGNOSIS — J9601 Acute respiratory failure with hypoxia: Secondary | ICD-10-CM

## 2019-09-21 DIAGNOSIS — E1122 Type 2 diabetes mellitus with diabetic chronic kidney disease: Secondary | ICD-10-CM | POA: Diagnosis not present

## 2019-09-21 DIAGNOSIS — I5022 Chronic systolic (congestive) heart failure: Secondary | ICD-10-CM

## 2019-09-21 HISTORY — DX: Paroxysmal atrial fibrillation: I48.0

## 2019-09-21 LAB — GLUCOSE, POCT (MANUAL RESULT ENTRY): POC Glucose: 189 mg/dl — AB (ref 70–99)

## 2019-09-21 NOTE — Assessment & Plan Note (Addendum)
Patient was initially presumed to be in heart failure exacerbation, but after echo and BNP were obtained without any acute worsening, it is presumed to be a pulmonary issue.  Patient likely has accommodation of obstructive sleep apnea and obesity hypoventilation syndrome. -Continue albuterol as needed -Advised patient to schedule an appointment with Dr. Valentina Lucks for PFTs - patient will need to discuss with his home health agency or pulmonologist regarding getting a nasal only mask for cpap.

## 2019-09-21 NOTE — Progress Notes (Signed)
SUBJECTIVE:   CHIEF COMPLAINT / HPI:   Diabetes: Patient states he was was taken off of his insulin and started on glimepiride by his cardiologist prior to his admission to the hospital.  In the hospital he was started on Lantus and short acting insulin 3 times a day with meal.  Patient has been compliant with the medications since discharge but does not have a glucometer because the one that they prescribed to him would have cost him $100.  He has still been taking his insulin but does not know what his blood sugar readings have been.  He denies dizziness, confusion, weakness.  He takes his glimepiride at 7 AM and at 7:15 AM he takes his short acting NovoLog with breakfast.  Medications: Went over the patient's med list and patient endorsed being compliant with these medications.  Of note, the patient takes the Lasix as needed whenever his cardiologist calls him and tells him that he is fluid overloaded.  He started taking his spironolactone and Entresto again yesterday.  Acute respiratory distress: Patient feels like he is breathing is much improved since his discharge.  They sent him home with an albuterol inhaler, but he has not needed it since discharge.  He is still having a cough, but this is when he has mucus that gets stuck in his throat only.  Patient has a CPAP machine at home but does not use it because he does not like the mask.  His family members have started smoking outside the house now.  Patient does not smoke.  Atrial fibrillation: Patient diagnosed with atrial fibrillation while in the hospital.  He has been taking his Eliquis since that time.  He has not had any episodes of bleeding: No epistaxis, bloody bowel movements, bloody vomit.  Back pain patient takes Flexeril as well as Norco for back pain.  He has not taken Flexeril since his discharge.   PERTINENT  PMH / PSH: Diabetes, CHF  OBJECTIVE:   BP 116/68   Pulse 73   Temp 98.5 F (36.9 C) (Oral)   Wt (!) 320 lb  (145.2 kg)   SpO2 98%   BMI 58.53 kg/m   General: No acute distress.  Obese male.  Wife is with him. CV: Difficult to auscultate distant heart sounds.  2+ pulse bilaterally. Pulmonary: No wheezes or crackles.  Lungs clear to auscultation bilaterally. Abdomen: Large pannus.  ASSESSMENT/PLAN:   DM (diabetes mellitus), type 2 with renal complications [facility]) Patient currently taking glimepiride, insulin (Lantus 15 units and NovoLog 5 units 3 times daily with meals (patient is not taking his blood glucose at home as he does not have a machine.  The glucometer that was prescribed to him was $100 out-of-pocket.  I informed the patient of the ReliOn meter from [employer] that cost around $20.  I also will resubmit for a alternative glucometer through insurance that Medicare will hopefully pay for. -Stop short acting Lantus and stop glimepiride -Continue Lantus 15 units -Follow-up in 2 days.  Acute respiratory failure ([place]) Patient was initially presumed to be in heart failure exacerbation, but after echo and BNP were obtained without any acute worsening, it is presumed to be a pulmonary issue.  Patient likely has accommodation of obstructive sleep apnea and obesity hypoventilation syndrome. -Continue albuterol as needed -Advised patient to schedule an appointment with Dr. Valentina Lucks for PFTs - patient will need to discuss with his home health agency or pulmonologist regarding getting a nasal only mask for cpap.  Paroxysmal atrial fibrillation [facility]) Patient diagnosed in the hospital.  Could not appreciate arrhythmia on heart exam today.  Patient on rate control medication with carvedilol.  Patient was started on Eliquis and has not had any bleeding episodes since this medication started. -Follow-up with cardiology  Chronic systolic heart failure ([place]) Patient was discontinued on Entresto and spironolactone at discharge due to his kidney function.  Patient stated that he was told to start back the  medication 2 days after discharge, which led him to taking his medication yesterday and today.  We will get blood work to examine the patient's kidney function.  We will call the patient to discuss medication changes based on this. -Follow-up on Friday, 6/18.     Benay Pike, MD [place]

## 2019-09-21 NOTE — Assessment & Plan Note (Signed)
Patient diagnosed in the hospital.  Could not appreciate arrhythmia on heart exam today.  Patient on rate control medication with carvedilol.  Patient was started on Eliquis and has not had any bleeding episodes since this medication started. -Follow-up with cardiology

## 2019-09-21 NOTE — Assessment & Plan Note (Signed)
Patient was discontinued on Entresto and spironolactone at discharge due to his kidney function.  Patient stated that he was told to start back the medication 2 days after discharge, which led him to taking his medication yesterday and today.  We will get blood work to examine the patient's kidney function.  We will call the patient to discuss medication changes based on this. -Follow-up on Friday, 6/18.

## 2019-09-21 NOTE — Patient Instructions (Addendum)
It was wonderful to see you today.  Please bring ALL of your medications with you to every visit.   Today we talked about:  1. STOP GLIMEPIRIDE AND SHORT ACTING INSULIN   2. Continue Lantus 15 units  BRING ALL OF YOUR MEDICAITONS TO YOUR NEXT VISIT    Schedule an appointment with me on Friday so we can discuss your labwork.    Schedule an appointment with Dr. Valentina Lucks for PFT testing.    Thank you for choosing [facility].   Please call [phone] with any questions about today's appointment.  Please be sure to schedule follow up at the front  desk before you leave today.   Clemetine Marker MD  Family Medicine

## 2019-09-21 NOTE — Assessment & Plan Note (Signed)
Patient currently taking glimepiride, insulin (Lantus 15 units and NovoLog 5 units 3 times daily with meals (patient is not taking his blood glucose at home as he does not have a machine.  The glucometer that was prescribed to him was $100 out-of-pocket.  I informed the patient of the ReliOn meter from [place] that cost around $20.  I also will resubmit for a alternative glucometer through insurance that Medicare will hopefully pay for. -Stop short acting Lantus and stop glimepiride -Continue Lantus 15 units -Follow-up in 2 days.

## 2019-09-22 ENCOUNTER — Telehealth: Payer: Self-pay | Admitting: Pharmacist

## 2019-09-22 DIAGNOSIS — N183 Chronic kidney disease, stage 3 unspecified: Secondary | ICD-10-CM

## 2019-09-22 DIAGNOSIS — E1122 Type 2 diabetes mellitus with diabetic chronic kidney disease: Secondary | ICD-10-CM

## 2019-09-22 LAB — CBC
Hematocrit: 37.6 % (ref 37.5–51.0)
Hemoglobin: 12.6 g/dL — ABNORMAL LOW (ref 13.0–17.7)
MCH: 29.2 pg (ref 26.6–33.0)
MCHC: 33.5 g/dL (ref 31.5–35.7)
MCV: 87 fL (ref 79–97)
Platelets: 264 10*3/uL (ref 150–450)
RBC: 4.31 x10E6/uL (ref 4.14–5.80)
RDW: 13.8 % (ref 11.6–15.4)
WBC: 10.5 10*3/uL (ref 3.4–10.8)

## 2019-09-22 LAB — BASIC METABOLIC PANEL
BUN/Creatinine Ratio: 10 (ref 10–24)
BUN: 19 mg/dL (ref 8–27)
CO2: 18 mmol/L — ABNORMAL LOW (ref 20–29)
Calcium: 8.4 mg/dL — ABNORMAL LOW (ref 8.6–10.2)
Chloride: 110 mmol/L — ABNORMAL HIGH (ref 96–106)
Creatinine, Ser: 1.81 mg/dL — ABNORMAL HIGH (ref 0.76–1.27)
GFR calc Af Amer: 46 mL/min/{1.73_m2} — ABNORMAL LOW (ref >59)
GFR calc non Af Amer: 39 mL/min/{1.73_m2} — ABNORMAL LOW (ref >59)
Glucose: 184 mg/dL — ABNORMAL HIGH (ref 65–99)
Potassium: 4.5 mmol/L (ref 3.5–5.2)
Sodium: 141 mmol/L (ref 134–144)

## 2019-09-22 LAB — TSH: TSH: 3.25 u[IU]/mL (ref 0.450–4.500)

## 2019-09-22 LAB — PHOSPHORUS: Phosphorus: 2 mg/dL — ABNORMAL LOW (ref 2.8–4.1)

## 2019-09-22 MED ORDER — ONETOUCH VERIO VI STRP: ORAL_STRIP | 12 refills | Status: DC

## 2019-09-22 MED ORDER — ONETOUCH VERIO W/DEVICE KIT: 1.0000 | PACK | 3 refills | Status: DC

## 2019-09-22 NOTE — Telephone Encounter (Signed)
Will also discuss [facility]) with patient to help advise how to choose more approprite insurance plan during Medicare open enrollment at follow pu pharmacy appt next Thursday (09/29/19)

## 2019-09-22 NOTE — Telephone Encounter (Signed)
Test Strips   Contacted patient's pharmacy to determine prescription insurance considering only Medicare part A/B is in the chart and not Medicare part D. It appears patient has Medicare part D via [employer]. Although test strips are ran under part B, specific test strip coverage is typically determined by Medicare part D. Pharmacy staff member stated she called [employer] and on patient's insurance plan test strips are NOT covered.  Researched on [employer] website to also double check insurance coverage of test strips (accu-chek, contour,freestyle, onetouch, true metrix). It appears there are three Medicare [facility] prescription insurance plans that do not cover test strips.     Called and spoke with patient's wife and explained lack of insurance coverage for test strips. Advised patient and wife to purchase Relion test kit that costs ~$18 at [facility] and will provide BG meter / test strips / lancets. Also advised patient and wife to bring Relion test strips to follow up pharmacy appt next Thursday to provide training on how to use BG meter.    CPAP Called patient (spoke to patient's wife) to determine which homehealth agency patient obtained CPAP through. Patient and wife are unsure. It appears per chart review that Dr. Annamaria Boots at [facility] performed sleep study for patient. Provided phone number to [facility] office to schedule follow up appt with Dr. Annamaria Boots. Phone got disconnected so LVM providing [facility] office and advised to schedule with Dr. Annamaria Boots.   Plan 1. Test strips: Called and spoke with patient's wife and explained lack of insurance coverage for test strips. Advised patient and wife to purchase Relion test kit that costs ~$18 at [facility] and will provide BG meter / test strips / lancets. Also advised patient and wife to bring Relion test strips to follow up pharmacy appt next Thursday to provide training on how to use BG meter.  2.  CPAP: Provided phone  number to [facility] office to schedule follow up appt with Dr. Annamaria Boots (provider who did sleep study). Phone got disconnected so LVM providing [facility] office and advised to schedule with Dr. Annamaria Boots to discuss new CPAP machine.

## 2019-09-22 NOTE — Telephone Encounter (Signed)
Error

## 2019-09-22 NOTE — Progress Notes (Deleted)
    SUBJECTIVE:   CHIEF COMPLAINT / HPI:   Cpap: does pt have the number?   Diabetes: did he get the reli-on?   Restart medications?   PERTINENT  PMH / PSH: ***  OBJECTIVE:   There were no vitals taken for this visit.  ***  ASSESSMENT/PLAN:   No problem-specific Assessment & Plan notes found for this encounter.     Benay Pike, MD [place]

## 2019-09-22 NOTE — Progress Notes (Signed)
Remote ICD transmission.   

## 2019-09-23 ENCOUNTER — Ambulatory Visit: Payer: Medicare Other | Admitting: Family Medicine

## 2019-09-26 ENCOUNTER — Telehealth: Payer: Self-pay | Admitting: Family Medicine

## 2019-09-26 NOTE — Telephone Encounter (Signed)
Patients wife is calling and would like for someone to call with there results from his last appointment.   The best call back number is [phone].

## 2019-09-27 ENCOUNTER — Ambulatory Visit: Payer: Medicare Other | Admitting: Pharmacist

## 2019-09-27 NOTE — Telephone Encounter (Signed)
Can you please let the patient know that his kidney function was improved since his hospitalization and it is okay for him to continue to the medications which he had restarted 2 days after discharge.  Other labs were not concerning.    We were supposed to discuss his results at his Friday visit but he canceled that appointment the day of.  It looks like he has rescheduled with me for the 25th.  I can discuss his results in detail at that time.  Please remind him to bring his glucometer results as well.

## 2019-09-27 NOTE — Telephone Encounter (Signed)
Called pts wife and gave her the below information and she said that pt has an appointment with PCP on 10/19/19 and she wanted to know if pt should continue to take the insulin that he was told to continue until he has a follow up appointment with PCP.  Instructed her that if he was told to continue insulin when pt left hospital that he should do so and that I would send PCP a note to confirm if pt should continue or if he should stop taking it before his appointment.Ezella Kell Zimmerman Rumple, CMA

## 2019-09-29 NOTE — Telephone Encounter (Signed)
Dear [name] Team I looked at the last note when he saw Dr. Matilde Bash here and I agree with that.  This is what Dr. Jeannine Kitten told him that I would agree with it. Stop short acting insulin and stop glimepiride -Continue Lantus 15 units  Also, it looks like he has an appointment on the 25th with Dr. Jeannine Kitten;  he should keep that.  He should not wait until the 14th when he can see me.

## 2019-09-30 ENCOUNTER — Ambulatory Visit: Payer: Medicare Other | Admitting: Family Medicine

## 2019-09-30 NOTE — Progress Notes (Deleted)
    SUBJECTIVE:   CHIEF COMPLAINT / HPI:   Diabetes: does he have meter? Does he have his readings?   Acute respiratory distress:  Did he go to appt with dr. Valentina Lucks? Nasal cpap?   Heart failure: what meds?   PERTINENT  PMH / PSH: ***  OBJECTIVE:   There were no vitals taken for this visit.  ***  ASSESSMENT/PLAN:   No problem-specific Assessment & Plan notes found for this encounter.     Benay Pike, MD [place]

## 2019-09-30 NOTE — Telephone Encounter (Signed)
Contacted pt wife and informed her of below but they had cancelled appointment for today due to transportation issues. April Zimmerman Rumple, CMA

## 2019-10-03 ENCOUNTER — Ambulatory Visit (INDEPENDENT_AMBULATORY_CARE_PROVIDER_SITE_OTHER): Payer: Medicare Other

## 2019-10-03 DIAGNOSIS — I5022 Chronic systolic (congestive) heart failure: Secondary | ICD-10-CM

## 2019-10-03 DIAGNOSIS — Z95810 Presence of automatic (implantable) cardiac defibrillator: Secondary | ICD-10-CM

## 2019-10-04 NOTE — Progress Notes (Signed)
EPIC Encounter for ICM Monitoring  Patient Name: KASHMERE STAFFA is a 59 y.o. male Date: 10/04/2019 Primary Care Physican: Dickie La, MD Primary [place] Electrophysiologist: Caryl Comes 6/29/2023Weight:320 lbs  Spoke with patient.  He said was in hospital in early June.  He has a cough that was dx related to reflux.   He reported taking PRN Lasix at time of decreased impedance.  Optivol thoracic impedancenormal.  Prescribed:Furosemide 40 mg Take 40 mg by mouth daily as needed for fluid.   Labs: 06/28/2019 Creatinine 1.95, BUN 23, Potassium 4.5, Sodium 140, GFR 06/22/2019 Creatinine 1.66, BUN 20, Potassium 4.7, Sodium 142, GFR 44-51  Recommendations:Recommendation to limit salt intake to 2000 mg daily and fluid intake to 64 oz daily.  Encouraged to call if experiencing any fluid symptoms.   Follow-up plan: ICM clinic phone appointment on8/05/2019.91 day device clinic remote transmission9/12/2019.   Copy of ICM check sent to [place].   3 month ICM trend: 10/03/2019    1 Year ICM trend:       Rosalene Billings, RN 10/04/2019 3:22 PM

## 2019-10-14 ENCOUNTER — Telehealth: Payer: Self-pay

## 2019-10-14 ENCOUNTER — Other Ambulatory Visit: Payer: Self-pay | Admitting: Family Medicine

## 2019-10-14 MED ORDER — ALLOPURINOL 100 MG PO TABS: 50.0000 mg | ORAL_TABLET | Freq: Every day | ORAL | 3 refills | Status: DC

## 2019-10-14 NOTE — Telephone Encounter (Signed)
Dear Dema Severin Team Please let him and his pharmacy know that he CANNOT TAKE NSAIDs on regular basis. He was taking colchicine daily for prophylaxis -. I will switch ihm t to ONE HALF tab a day of allopurinol and we can see how he does. Much safer than NSAIDS. THANKS! I have sent rx in [name]

## 2019-10-14 NOTE — Telephone Encounter (Signed)
Contacted pt wife and informed her of below.[name], CMA

## 2019-10-14 NOTE — Telephone Encounter (Signed)
Informed pharmacy as well. Treyvone Chelf Zimmerman Rumple, CMA

## 2019-10-14 NOTE — Telephone Encounter (Signed)
Pharmacy calls stating Colchicine is not covered by patients insurance. Pharmacist request Indomethacin to be sent in, per pharmacy, this will covered. Please send to [facility].

## 2019-10-19 ENCOUNTER — Other Ambulatory Visit: Payer: Self-pay

## 2019-10-19 ENCOUNTER — Ambulatory Visit (INDEPENDENT_AMBULATORY_CARE_PROVIDER_SITE_OTHER): Payer: Medicare Other | Admitting: Family Medicine

## 2019-10-19 ENCOUNTER — Encounter: Payer: Self-pay | Admitting: Family Medicine

## 2019-10-19 VITALS — BP 122/68 | HR 64 | Ht 62.0 in | Wt 323.8 lb

## 2019-10-19 DIAGNOSIS — N183 Chronic kidney disease, stage 3 unspecified: Secondary | ICD-10-CM | POA: Diagnosis not present

## 2019-10-19 DIAGNOSIS — I5022 Chronic systolic (congestive) heart failure: Secondary | ICD-10-CM | POA: Diagnosis not present

## 2019-10-19 DIAGNOSIS — M25569 Pain in unspecified knee: Secondary | ICD-10-CM | POA: Diagnosis not present

## 2019-10-19 DIAGNOSIS — M170 Bilateral primary osteoarthritis of knee: Secondary | ICD-10-CM | POA: Diagnosis not present

## 2019-10-19 DIAGNOSIS — I10 Essential (primary) hypertension: Secondary | ICD-10-CM | POA: Diagnosis not present

## 2019-10-19 DIAGNOSIS — G8929 Other chronic pain: Secondary | ICD-10-CM | POA: Diagnosis not present

## 2019-10-19 DIAGNOSIS — E1122 Type 2 diabetes mellitus with diabetic chronic kidney disease: Secondary | ICD-10-CM | POA: Diagnosis not present

## 2019-10-19 DIAGNOSIS — M1A9XX0 Chronic gout, unspecified, without tophus (tophi): Secondary | ICD-10-CM | POA: Diagnosis not present

## 2019-10-19 MED ORDER — HYDROCODONE-ACETAMINOPHEN 5-325 MG PO TABS: ORAL_TABLET | ORAL | 0 refills | Status: DC

## 2019-10-19 NOTE — Assessment & Plan Note (Signed)
I agree with the changes that made in the hospital and will continue his current medication regimen.  They just picked up a new glucometer and we talked about taking blood sugars at different times of the day several times a week.

## 2019-10-19 NOTE — Progress Notes (Signed)
    CHIEF COMPLAINT / HPI: Here with his wife for follow-up of recent hospitalization.  He is feeling much better.  States that her and some insulin on him and he wants to know if he should continue that.  They also stopped some of his oral medications for diabetes.  Feels like he is back to his baseline pretty much.  Not exercising.  Denies chest pain, no shortness of breath.   PERTINENT  PMH / PSH: I have reviewed the patient's medications, allergies, past medical and surgical history, smoking status and updated in the EMR as appropriate.   OBJECTIVE:  BP 122/68   Pulse 64   Ht 5\' 2"  (1.575 m)   Wt (!) 323 lb 12.8 oz (146.9 kg)   SpO2 97%   BMI 59.22 kg/m  Vital signs reviewed. GENERAL: Well-developed, well-nourished, no acute distress.  Obese. CARDIOVASCULAR: Regular rate and rhythm no murmur LUNGS: Clear to auscultation bilaterally, no rales or wheeze. ABDOMEN: Soft positive bowel sounds.  No organomegaly but this exam is limited by habitus.  Nontender. NEURO: Severe vision deficit. MSK: Movement of extremity x 4.  He can rise from a chair without assistance. EXTREMITY: No lower extremity edema. Vascular: Radial pulses and dorsalis pedis pulses are 2+ bilateral are symmetrical.    ASSESSMENT / PLAN:   Chronic systolic heart failure (HCC) Seems euvolemic today.  I will get some lab work.  No medication changes from a cardiovascular standpoint.  DM (diabetes mellitus), type 2 with renal complications ([place]) I agree with the changes that made in the hospital and will continue his current medication regimen.  They just picked up a new glucometer and we talked about taking blood sugars at different times of the day several times a week.  Arthritis of knee, degenerative Continues to have severe knee pain.  He is not a operative candidate.  We will continue chronic pain medicine.  Refills.   [name] MD

## 2019-10-19 NOTE — Assessment & Plan Note (Signed)
Seems euvolemic today.  I will get some lab work.  No medication changes from a cardiovascular standpoint.

## 2019-10-19 NOTE — Patient Instructions (Signed)
I will send you a copy of your labs and let me see you in 3 months.

## 2019-10-19 NOTE — Assessment & Plan Note (Signed)
Continues to have severe knee pain.  He is not a operative candidate.  We will continue chronic pain medicine.  Refills.

## 2019-10-20 LAB — COMPREHENSIVE METABOLIC PANEL
ALT: 18 IU/L (ref 0–44)
AST: 16 IU/L (ref 0–40)
Albumin/Globulin Ratio: 1.5 (ref 1.2–2.2)
Albumin: 4.1 g/dL (ref 3.8–4.8)
Alkaline Phosphatase: 81 IU/L (ref 48–121)
BUN/Creatinine Ratio: 10 (ref 10–24)
BUN: 16 mg/dL (ref 8–27)
Bilirubin Total: 0.5 mg/dL (ref 0.0–1.2)
CO2: 22 mmol/L (ref 20–29)
Calcium: 9 mg/dL (ref 8.6–10.2)
Chloride: 103 mmol/L (ref 96–106)
Creatinine, Ser: 1.68 mg/dL — ABNORMAL HIGH (ref 0.76–1.27)
GFR calc Af Amer: 50 mL/min/{1.73_m2} — ABNORMAL LOW (ref >59)
GFR calc non Af Amer: 43 mL/min/{1.73_m2} — ABNORMAL LOW (ref >59)
Globulin, Total: 2.7 g/dL (ref 1.5–4.5)
Glucose: 300 mg/dL — ABNORMAL HIGH (ref 65–99)
Potassium: 5.1 mmol/L (ref 3.5–5.2)
Sodium: 136 mmol/L (ref 134–144)
Total Protein: 6.8 g/dL (ref 6.0–8.5)

## 2019-10-20 LAB — URIC ACID: Uric Acid: 6.7 mg/dL (ref 3.8–8.4)

## 2019-10-24 ENCOUNTER — Encounter: Payer: Self-pay | Admitting: Family Medicine

## 2019-11-01 ENCOUNTER — Other Ambulatory Visit: Payer: Self-pay | Admitting: Family Medicine

## 2019-11-01 NOTE — Telephone Encounter (Signed)
Patient's wife calling requesting a refill on patients new insulin pens he was prescribed at hospital. He is taking Lantus 100 units/ML pens(not needles now) and he is taking 15 units a night. Pharmacy is [facility] [address]

## 2019-11-02 MED ORDER — LANTUS SOLOSTAR 100 UNIT/ML ~~LOC~~ SOPN: 15.0000 [IU] | PEN_INJECTOR | Freq: Every day | SUBCUTANEOUS | 12 refills | Status: DC

## 2019-11-02 NOTE — Telephone Encounter (Signed)
Dear Dema Severin Team This has been done [employer]! [name]

## 2019-11-02 NOTE — Telephone Encounter (Signed)
Patient's wife LVM on nurse line to check status of rx refill.

## 2019-11-03 ENCOUNTER — Telehealth: Payer: Self-pay

## 2019-11-03 NOTE — Telephone Encounter (Signed)
Patient's wife, returns call to nurse line regarding receiving lantus samples.   Forwarding to Dr. Nori Riis and Dr. Pervis Hocking, RN

## 2019-11-03 NOTE — Telephone Encounter (Signed)
Patients wife calls nurse line stating she has accidentally been throwing away insulin pens after one injection. Patients wife upset that she did not realize one pen is multi use, you just change the pen needle. Patient reports the refill at the pharmacy that PCP provided will not be covered by insurance due to being too soon to fill. Ok to leave samples of Lantus for patient to pick up?

## 2019-11-03 NOTE — Telephone Encounter (Signed)
Contacted patient regarding Lantus samples. Patient's wife will pick up Lantus later this afternoon.   Medication Samples have been provided to the patient.  Drug name: Lantus      Strength: 100 units/ml       Qty: 1 LOT:  [id number] Exp.Date: 01/04/2021  Dosing instructions: Inject 15 units subcutaneously once daily  The patient has been instructed regarding the correct time, dose, and frequency of taking this medication, including desired effects and most common side effects.   Lorel Monaco, PharmD PGY2 Ambulatory Care Resident [place]

## 2019-11-10 ENCOUNTER — Other Ambulatory Visit: Payer: Self-pay | Admitting: Family Medicine

## 2019-11-10 DIAGNOSIS — S39012A Strain of muscle, fascia and tendon of lower back, initial encounter: Secondary | ICD-10-CM

## 2019-11-15 NOTE — Progress Notes (Signed)
No ICM remote transmission received for 11/07/2019 and next ICM transmission scheduled for 12/05/2019.

## 2019-11-18 ENCOUNTER — Other Ambulatory Visit: Payer: Self-pay

## 2019-11-18 DIAGNOSIS — M25569 Pain in unspecified knee: Secondary | ICD-10-CM

## 2019-11-18 DIAGNOSIS — M170 Bilateral primary osteoarthritis of knee: Secondary | ICD-10-CM

## 2019-11-18 DIAGNOSIS — G8929 Other chronic pain: Secondary | ICD-10-CM

## 2019-11-18 MED ORDER — HYDROCODONE-ACETAMINOPHEN 5-325 MG PO TABS: ORAL_TABLET | ORAL | 0 refills | Status: DC

## 2019-11-18 MED ORDER — APIXABAN 5 MG PO TABS: 5.0000 mg | ORAL_TABLET | Freq: Two times a day (BID) | ORAL | 0 refills | Status: DC

## 2019-11-28 DIAGNOSIS — I5020 Unspecified systolic (congestive) heart failure: Secondary | ICD-10-CM | POA: Diagnosis not present

## 2019-11-28 DIAGNOSIS — I420 Dilated cardiomyopathy: Secondary | ICD-10-CM | POA: Diagnosis not present

## 2019-11-28 DIAGNOSIS — N189 Chronic kidney disease, unspecified: Secondary | ICD-10-CM | POA: Diagnosis not present

## 2019-11-28 DIAGNOSIS — R55 Syncope and collapse: Secondary | ICD-10-CM | POA: Diagnosis not present

## 2019-11-28 DIAGNOSIS — E119 Type 2 diabetes mellitus without complications: Secondary | ICD-10-CM | POA: Diagnosis not present

## 2019-11-28 DIAGNOSIS — I472 Ventricular tachycardia: Secondary | ICD-10-CM | POA: Diagnosis not present

## 2019-11-28 DIAGNOSIS — I10 Essential (primary) hypertension: Secondary | ICD-10-CM | POA: Diagnosis not present

## 2019-11-28 DIAGNOSIS — E785 Hyperlipidemia, unspecified: Secondary | ICD-10-CM | POA: Diagnosis not present

## 2019-12-08 ENCOUNTER — Telehealth: Payer: Self-pay

## 2019-12-08 NOTE — Telephone Encounter (Signed)
The pt wife agreed to do missed ICM transmission tomorrow morning.

## 2019-12-13 NOTE — Progress Notes (Signed)
No ICM remote transmission received for 12/05/2019 and next ICM transmission scheduled for 12/16/2019.

## 2019-12-15 ENCOUNTER — Ambulatory Visit (INDEPENDENT_AMBULATORY_CARE_PROVIDER_SITE_OTHER): Payer: Medicare Other | Admitting: *Deleted

## 2019-12-15 DIAGNOSIS — I495 Sick sinus syndrome: Secondary | ICD-10-CM | POA: Diagnosis not present

## 2019-12-16 ENCOUNTER — Telehealth: Payer: Self-pay | Admitting: Family Medicine

## 2019-12-16 ENCOUNTER — Telehealth: Payer: Self-pay

## 2019-12-16 ENCOUNTER — Other Ambulatory Visit: Payer: Self-pay

## 2019-12-16 DIAGNOSIS — G8929 Other chronic pain: Secondary | ICD-10-CM

## 2019-12-16 DIAGNOSIS — M25569 Pain in unspecified knee: Secondary | ICD-10-CM

## 2019-12-16 DIAGNOSIS — M170 Bilateral primary osteoarthritis of knee: Secondary | ICD-10-CM

## 2019-12-16 MED ORDER — HYDROCODONE-ACETAMINOPHEN 5-325 MG PO TABS: ORAL_TABLET | ORAL | 0 refills | Status: DC

## 2019-12-16 NOTE — Telephone Encounter (Signed)
Patient's wife is calling to request refill for patients Hydrocodone.

## 2019-12-16 NOTE — Telephone Encounter (Signed)
Refill request has been sent to Dr. Nori Riis. Ottis Stain, [place]

## 2019-12-16 NOTE — Telephone Encounter (Signed)
The pt wife tried to send a transmission but the monitor was not working. I gave her the number to [place] support to get additional help.

## 2019-12-17 LAB — CUP PACEART REMOTE DEVICE CHECK
Battery Remaining Longevity: 89 mo
Battery Voltage: 2.97 V
Brady Statistic RV Percent Paced: 0.05 %
Date Time Interrogation Session: 20210910162126
HighPow Impedance: 48 Ohm
HighPow Impedance: 57 Ohm
Implantable Lead Implant Date: 20081114
Implantable Lead Location: 753860
Implantable Lead Model: 6947
Implantable Pulse Generator Implant Date: 20170222
Lead Channel Impedance Value: 399 Ohm
Lead Channel Impedance Value: 456 Ohm
Lead Channel Pacing Threshold Amplitude: 0.5 V
Lead Channel Pacing Threshold Pulse Width: 0.4 ms
Lead Channel Sensing Intrinsic Amplitude: 6.875 mV
Lead Channel Sensing Intrinsic Amplitude: 6.875 mV
Lead Channel Setting Pacing Amplitude: 2.5 V
Lead Channel Setting Pacing Pulse Width: 0.4 ms
Lead Channel Setting Sensing Sensitivity: 0.3 mV

## 2019-12-19 ENCOUNTER — Ambulatory Visit (INDEPENDENT_AMBULATORY_CARE_PROVIDER_SITE_OTHER): Payer: Medicare Other

## 2019-12-19 DIAGNOSIS — Z95810 Presence of automatic (implantable) cardiac defibrillator: Secondary | ICD-10-CM

## 2019-12-19 DIAGNOSIS — I5022 Chronic systolic (congestive) heart failure: Secondary | ICD-10-CM

## 2019-12-20 NOTE — Progress Notes (Signed)
Remote ICD transmission.   

## 2019-12-21 NOTE — Progress Notes (Signed)
EPIC Encounter for ICM Monitoring  Patient Name: [name] is a 59 y.o. male Date: 12/21/2019 Primary Care Physican: Dickie La, MD Primary [place] Electrophysiologist: Caryl Comes 6/29/2023Weight:320 lbs  Spoke with patient and reports feeling well at this time.  Denies fluid symptoms.    Optivol thoracic impedancesuggesting normal fluid levels.  Prescribed:Furosemide 40 mg Take 40 mg by mouth daily as needed for fluid.   Labs: 06/28/2019 Creatinine 1.95, BUN 23, Potassium 4.5, Sodium 140, GFR 06/22/2019 Creatinine 1.66, BUN 20, Potassium 4.7, Sodium 142, GFR 44-51  Recommendations:  Encouraged to call if experiencing any fluid symptoms.   Follow-up plan: ICM clinic phone appointment on10/18/2021.91 day device clinic remote transmission9/12/2019.   Copy of ICM check sent to [place].  3 month ICM trend: 12/16/2019    1 Year ICM trend:       Rosalene Billings, RN 12/21/2019 4:33 PM

## 2019-12-23 ENCOUNTER — Telehealth: Payer: Self-pay

## 2019-12-23 NOTE — Telephone Encounter (Signed)
Received phone call from patient's wife regarding concerns with patient having penis irritation. Wife states that there is "extra skin" that patient is unable to pull back in order to urinate normally. Patient reports that he is still able to urinate, "the skin is just in the way". Reports that he has been circumcised. Endorses slight penile itching and raw skin around tip of penis.   Denies inability to empty bladder, fever, abdominal pain, back pain, or pain with urination.   Patient scheduled appointment for Wednesday morning. Advised patient to keep the area as clean as possible and to monitor for signs of infection.   UC/ Return precautions given.   To PCP  Please advise any additional recommendations.   Talbot Grumbling, RN

## 2019-12-28 ENCOUNTER — Other Ambulatory Visit (HOSPITAL_COMMUNITY)
Admission: RE | Admit: 2019-12-28 | Discharge: 2019-12-28 | Disposition: A | Discharge status: Home / Self Care | Payer: Medicare Other | Source: Ambulatory Visit | Attending: Family Medicine | Admitting: Family Medicine

## 2019-12-28 ENCOUNTER — Ambulatory Visit (INDEPENDENT_AMBULATORY_CARE_PROVIDER_SITE_OTHER): Payer: Medicare Other | Admitting: Family Medicine

## 2019-12-28 ENCOUNTER — Other Ambulatory Visit: Payer: Self-pay

## 2019-12-28 VITALS — BP 126/64 | Wt 301.6 lb

## 2019-12-28 DIAGNOSIS — R300 Dysuria: Secondary | ICD-10-CM

## 2019-12-28 DIAGNOSIS — L293 Anogenital pruritus, unspecified: Secondary | ICD-10-CM

## 2019-12-28 DIAGNOSIS — N4889 Other specified disorders of penis: Secondary | ICD-10-CM

## 2019-12-28 DIAGNOSIS — N183 Chronic kidney disease, stage 3 unspecified: Secondary | ICD-10-CM | POA: Diagnosis not present

## 2019-12-28 DIAGNOSIS — E1122 Type 2 diabetes mellitus with diabetic chronic kidney disease: Secondary | ICD-10-CM

## 2019-12-28 LAB — POCT GLYCOSYLATED HEMOGLOBIN (HGB A1C): HbA1c, POC (controlled diabetic range): 14.3 % — AB (ref 0.0–7.0)

## 2019-12-28 MED ORDER — FLUCONAZOLE 150 MG PO TABS
75.0000 mg | ORAL_TABLET | Freq: Every day | ORAL | 0 refills | Status: AC
Start: 2019-12-28 — End: 2019-12-30

## 2019-12-28 NOTE — Progress Notes (Signed)
    SUBJECTIVE:   CHIEF COMPLAINT / HPI: penis is irritating  Penile itching Patient report that head of penis is very itchy and causing irritation.  Denies any penile discharge, urinary symptoms.  Reports last sexual encounter about 3 months ago with wife.    Diabetes Denies any hypoglycemic events.   Patient reports compliance with medications.  Denies any polyuria, polydipsia, weakness, chest pain, nausea/vomiting or abdominal pain.  Does not check sugars at home.  PERTINENT  PMH / PSH:  DM Type 2 HTN CHF ICD OSA  OBJECTIVE:   BP 126/64   Wt (!) 301 lb 9.6 oz (136.8 kg)   BMI 55.16 kg/m    General: Alert and oriented, no apparent distress  Cardiovascular: RRR with no murmurs noted Respiratory: CTA bilaterally  Gastrointestinal: Bowel sounds present. No abdominal pain.   GU: small amt white discharge noted and excoriated glans, unable to clearly visualize urethra.  Non edema.  Testes normal, no testicular pain.    ASSESSMENT/PLAN:   Itching of penis Give excoriated glans and white discharge, likely secondary to candida. -GC/C negative -Unable to obtain urine as patient does not need to void -Digflucan 75 mg x 2/7, given low CrCl -Follow up with PCP as needed  DM (diabetes mellitus), type 2 with renal complications (HCC) Last A1c 10.8(06/21).  Compliant with medications.  No polyuria, polydipsia and no hypoglycemic events -HbA1c, Bmet today -May need to increase medications pending lab results -Follow up with PCP 3/12, pending lab results may need earlier evaluation      Carollee Leitz, MD [place]

## 2019-12-28 NOTE — Patient Instructions (Signed)
Thank you for coming to see me today. It was a pleasure.   Take Diflucan for two days.  I will call you with your lab results.  Please follow-up with PCP as needed or if symptoms worsen.  If you have any questions or concerns, please do not hesitate to call the office at [phone].  Best,   Carollee Leitz, MD Family Medicine Residency

## 2019-12-29 ENCOUNTER — Telehealth: Payer: Self-pay | Admitting: Family Medicine

## 2019-12-29 LAB — BASIC METABOLIC PANEL
BUN/Creatinine Ratio: 10 (ref 10–24)
BUN: 21 mg/dL (ref 8–27)
CO2: 19 mmol/L — ABNORMAL LOW (ref 20–29)
Calcium: 9.4 mg/dL (ref 8.6–10.2)
Chloride: 96 mmol/L (ref 96–106)
Creatinine, Ser: 2.03 mg/dL — ABNORMAL HIGH (ref 0.76–1.27)
GFR calc Af Amer: 40 mL/min/{1.73_m2} — ABNORMAL LOW (ref >59)
GFR calc non Af Amer: 34 mL/min/{1.73_m2} — ABNORMAL LOW (ref >59)
Glucose: 640 mg/dL (ref 65–99)
Potassium: 4.8 mmol/L (ref 3.5–5.2)
Sodium: 133 mmol/L — ABNORMAL LOW (ref 134–144)

## 2019-12-29 LAB — CERVICOVAGINAL ANCILLARY ONLY
Chlamydia: NEGATIVE
Comment: NEGATIVE
Comment: NORMAL
Neisseria Gonorrhea: NEGATIVE

## 2019-12-29 MED ORDER — LANTUS SOLOSTAR 100 UNIT/ML ~~LOC~~ SOPN: PEN_INJECTOR | SUBCUTANEOUS | 12 refills | Status: DC

## 2019-12-29 NOTE — Progress Notes (Signed)
Noted and called patient to discuss

## 2019-12-29 NOTE — Telephone Encounter (Signed)
Dear Dema Severin Team Can you call him and tell him I spoke to Dr. Volanda Napoleon about his blood sugar reading from yesterday. I AGREE that he should increase his insulin from 15 units daily to 25 units daily.  I am working in hospital and cannot ptobably call him until much later in the day so if you will pass on this message. IF he wants me to call him personally I will do so but it will be lateafternoon.  IF he wants me to call, pleaas let me know. THANKS! [name]

## 2019-12-29 NOTE — Telephone Encounter (Signed)
Called patient to discuss critical blood glucose > 600.  HbA1c 14.3.  Patient denies any weakness, dizziness, chest pain, SOB, polyuria or polydipsia.  States he is feeling good.  Compliant with Lantus 15u at night.  Recommend increase Lantus to 25u nightly.  Patient would like to speak with PCP before increasing. Strict return precautions provided.  Carollee Leitz, MD Family Medicine Residency

## 2019-12-29 NOTE — Telephone Encounter (Signed)
Patients wife calls nurse line requesting insulin management. Informed wife to increase insulin to 25 units daily, per Dr. Nori Riis. Wife agreeable and will start today. She is requesting a new prescription to sent his pharmacy to reflect this change. Please advise.

## 2019-12-30 MED ORDER — LANTUS SOLOSTAR 100 UNIT/ML ~~LOC~~ SOPN: PEN_INJECTOR | SUBCUTANEOUS | 3 refills | Status: DC

## 2020-01-01 ENCOUNTER — Encounter: Payer: Self-pay | Admitting: Family Medicine

## 2020-01-01 DIAGNOSIS — L293 Anogenital pruritus, unspecified: Secondary | ICD-10-CM | POA: Insufficient documentation

## 2020-01-01 NOTE — Assessment & Plan Note (Signed)
Last A1c 10.8(06/21).  Compliant with medications.  No polyuria, polydipsia and no hypoglycemic events -HbA1c, Bmet today -May need to increase medications pending lab results -Follow up with PCP 3/12, pending lab results may need earlier evaluation

## 2020-01-01 NOTE — Assessment & Plan Note (Signed)
Give excoriated glans and white discharge, likely secondary to candida. -GC/C negative -Unable to obtain urine as patient does not need to void -Digflucan 75 mg x 2/7, given low CrCl -Follow up with PCP as needed

## 2020-01-07 ENCOUNTER — Other Ambulatory Visit: Payer: Self-pay | Admitting: Family Medicine

## 2020-01-07 DIAGNOSIS — S39012A Strain of muscle, fascia and tendon of lower back, initial encounter: Secondary | ICD-10-CM

## 2020-01-09 ENCOUNTER — Other Ambulatory Visit: Payer: Self-pay

## 2020-01-09 DIAGNOSIS — G8929 Other chronic pain: Secondary | ICD-10-CM

## 2020-01-09 DIAGNOSIS — M170 Bilateral primary osteoarthritis of knee: Secondary | ICD-10-CM

## 2020-01-09 DIAGNOSIS — M25569 Pain in unspecified knee: Secondary | ICD-10-CM

## 2020-01-10 MED ORDER — HYDROCODONE-ACETAMINOPHEN 5-325 MG PO TABS: ORAL_TABLET | ORAL | 0 refills | Status: DC

## 2020-01-12 ENCOUNTER — Telehealth: Payer: Self-pay

## 2020-01-12 NOTE — Telephone Encounter (Signed)
Patient's wife calls nurse line with questions regarding allopurinol. Wife states that patient was taking colchine (mitagare) for gout. Advised that per note from Dr. Nori Riis, patient was to replace colchine for allopurinol.   Wife verbalizes understanding. All questions answered.   Talbot Grumbling, RN

## 2020-01-18 ENCOUNTER — Encounter: Payer: Self-pay | Admitting: Family Medicine

## 2020-01-18 ENCOUNTER — Ambulatory Visit (INDEPENDENT_AMBULATORY_CARE_PROVIDER_SITE_OTHER): Payer: Medicare Other | Admitting: Family Medicine

## 2020-01-18 ENCOUNTER — Other Ambulatory Visit: Payer: Self-pay

## 2020-01-18 VITALS — BP 138/80 | HR 85 | Ht 62.0 in | Wt 295.4 lb

## 2020-01-18 DIAGNOSIS — N183 Chronic kidney disease, stage 3 unspecified: Secondary | ICD-10-CM

## 2020-01-18 DIAGNOSIS — E1122 Type 2 diabetes mellitus with diabetic chronic kidney disease: Secondary | ICD-10-CM

## 2020-01-18 DIAGNOSIS — N1831 Chronic kidney disease, stage 3a: Secondary | ICD-10-CM

## 2020-01-18 DIAGNOSIS — I5022 Chronic systolic (congestive) heart failure: Secondary | ICD-10-CM | POA: Diagnosis not present

## 2020-01-18 DIAGNOSIS — I10 Essential (primary) hypertension: Secondary | ICD-10-CM

## 2020-01-18 DIAGNOSIS — Z23 Encounter for immunization: Secondary | ICD-10-CM | POA: Diagnosis not present

## 2020-01-18 DIAGNOSIS — M1A39X0 Chronic gout due to renal impairment, multiple sites, without tophus (tophi): Secondary | ICD-10-CM | POA: Diagnosis not present

## 2020-01-18 DIAGNOSIS — E785 Hyperlipidemia, unspecified: Secondary | ICD-10-CM

## 2020-01-18 NOTE — Patient Instructions (Signed)
I will give you a call if we need to change your cholesterol medicine to a higher dose or if there is anything else unusual on your labs.  I do think it is probably time to have you see a kidney doctor.  When you are in the hospital last time, your kidney function was quite elevated.  It has roughly returned to baseline but at this point I think we did benefit from having a nephrologist follow you.  They will probably call you in the next 1 to 2 weeks.  If they have not contacted you in 2 weeks, please let me know by calling my office.  We gave you your Covid booster today and your flu shot.  Your arm may be sore tomorrow  I would like to see you back in 2 to 3 months.  Please let me know if there is something in the interim.  Great to see you!

## 2020-01-19 LAB — COMPREHENSIVE METABOLIC PANEL
ALT: 66 IU/L — ABNORMAL HIGH (ref 0–44)
AST: 51 IU/L — ABNORMAL HIGH (ref 0–40)
Albumin/Globulin Ratio: 1.8 (ref 1.2–2.2)
Albumin: 4.4 g/dL (ref 3.8–4.8)
Alkaline Phosphatase: 110 IU/L (ref 44–121)
BUN/Creatinine Ratio: 8 — ABNORMAL LOW (ref 10–24)
BUN: 18 mg/dL (ref 8–27)
Bilirubin Total: 0.6 mg/dL (ref 0.0–1.2)
CO2: 21 mmol/L (ref 20–29)
Calcium: 8.8 mg/dL (ref 8.6–10.2)
Chloride: 101 mmol/L (ref 96–106)
Creatinine, Ser: 2.14 mg/dL — ABNORMAL HIGH (ref 0.76–1.27)
GFR calc Af Amer: 37 mL/min/{1.73_m2} — ABNORMAL LOW (ref >59)
GFR calc non Af Amer: 32 mL/min/{1.73_m2} — ABNORMAL LOW (ref >59)
Globulin, Total: 2.5 g/dL (ref 1.5–4.5)
Glucose: 511 mg/dL (ref 65–99)
Potassium: 4.5 mmol/L (ref 3.5–5.2)
Sodium: 138 mmol/L (ref 134–144)
Total Protein: 6.9 g/dL (ref 6.0–8.5)

## 2020-01-19 LAB — LIPID PANEL
Chol/HDL Ratio: 5.1 ratio — ABNORMAL HIGH (ref 0.0–5.0)
Cholesterol, Total: 137 mg/dL (ref 100–199)
HDL: 27 mg/dL — ABNORMAL LOW (ref >39)
LDL Chol Calc (NIH): 61 mg/dL (ref 0–99)
Triglycerides: 314 mg/dL — ABNORMAL HIGH (ref 0–149)
VLDL Cholesterol Cal: 49 mg/dL — ABNORMAL HIGH (ref 5–40)

## 2020-01-19 LAB — URIC ACID: Uric Acid: 4.4 mg/dL (ref 3.8–8.4)

## 2020-01-19 MED ORDER — ISOSORBIDE MONONITRATE 20 MG PO TABS
20.0000 mg | ORAL_TABLET | Freq: Two times a day (BID) | ORAL | 3 refills | Status: DC
Start: 2020-01-19 — End: 2020-08-14

## 2020-01-19 NOTE — Assessment & Plan Note (Addendum)
He seems euvolemic today.  He is not having any complaints.    continue current medication plan.  He does follow-up with cardiology as well but I do not see a recent note. He is to check with his wife and see if he has an upcoming appt.

## 2020-01-19 NOTE — Progress Notes (Signed)
    CHIEF COMPLAINT / HPI: Here for checkup.  No one accompanies him today.  He has no complaints.  Does note that he has been kicked out of his apartment and he and his son are currently living in a hotel.  Hopefully they will be getting new housing soon and have an appointment this coming Friday with [employer]. He has not been checking his blood sugars regularly.  Has had no dietary indiscretions, no extremely low sugars.  Taking his medicines regularly. No problems obtaining his medications. 2.  Chronic kidney disease: He has not seen his kidney doctor in some time and is not sure when his next appointment is 3.  Chronic systolic heart failure: No unusual shortness of breath, has not noted any change in his exercise tolerance.  Has noted no lower extremity edema.   PERTINENT  PMH / PSH: I have reviewed the patient's medications, allergies, past medical and surgical history, smoking status and updated in the EMR as appropriate.   OBJECTIVE:  BP 138/80   Pulse 85   Ht 5\' 2"  (1.575 m)   Wt 295 lb 6.4 oz (134 kg)   SpO2 96%   BMI 54.03 kg/m  GENERAL: Well-developed overweight male no acute distress CV: Regular rate and rhythm without murmur LUNGS: Clear to auscultation bilaterally. ABDOMEN: Soft, obese, no masses or tenderness although this exam is limited by habitus EXTREMITY: No edema noted. MSK: He rises from a chair and walks with a cane without assistance other than for vision.  ASSESSMENT / PLAN:   DM (diabetes mellitus), type 2 with renal complications (HCC) We will check labs today.  His A1c looks great.  We will continue on his current medication plan and follow him up in 3 months.  Chronic systolic heart failure ([place]) He seems euvolemic today.  He is not having any complaints.    continue current medication plan.  He does follow-up with cardiology as well.  RENAL DISEASE, CHRONIC, STAGE III We will check labs today.  I thought he had been following with  nephrology but evidently that has somehow fallen through the cracks.  I will do a referral   [name] MD

## 2020-01-19 NOTE — Assessment & Plan Note (Addendum)
We will check labs today.    We will continue on his current medication plan and follow him up in 2 months. He was recently started on insulin so I will check A1C at next visit.

## 2020-01-19 NOTE — Assessment & Plan Note (Signed)
We will check labs today.  I thought he had been following with nephrology but evidently that has somehow fallen through the cracks.  I will do a referral

## 2020-01-27 NOTE — Progress Notes (Signed)
No ICM remote transmission received for 01/23/2020 and next ICM transmission scheduled for 02/13/2020.   

## 2020-01-31 ENCOUNTER — Telehealth: Payer: Self-pay | Admitting: Family Medicine

## 2020-01-31 NOTE — Telephone Encounter (Signed)
I have tried to call and discuss his labs. I keep getting a mssg the number is not working. Will continue to try. He told me at last ov he is living in a hotel so a letter will not work. [name]

## 2020-02-01 ENCOUNTER — Telehealth: Payer: Self-pay

## 2020-02-01 NOTE — Telephone Encounter (Signed)
Patient called back and stated [employer] is going to send a new handheld and the patient will call once he receives that in the mail

## 2020-02-01 NOTE — Telephone Encounter (Signed)
Patient called in c/o monitor not working and he says that this has happened before. I have given his sister the number to tech support and they will call us back and let us know what happens.

## 2020-02-03 NOTE — Telephone Encounter (Signed)
The patient received the new handheld and sent transmission.

## 2020-02-04 ENCOUNTER — Other Ambulatory Visit: Payer: Self-pay | Admitting: Family Medicine

## 2020-02-07 DIAGNOSIS — E1122 Type 2 diabetes mellitus with diabetic chronic kidney disease: Secondary | ICD-10-CM | POA: Diagnosis not present

## 2020-02-07 DIAGNOSIS — N1832 Chronic kidney disease, stage 3b: Secondary | ICD-10-CM | POA: Diagnosis not present

## 2020-02-07 DIAGNOSIS — R809 Proteinuria, unspecified: Secondary | ICD-10-CM | POA: Diagnosis not present

## 2020-02-07 DIAGNOSIS — I5022 Chronic systolic (congestive) heart failure: Secondary | ICD-10-CM | POA: Diagnosis not present

## 2020-02-07 DIAGNOSIS — M1A9XX0 Chronic gout, unspecified, without tophus (tophi): Secondary | ICD-10-CM | POA: Diagnosis not present

## 2020-02-07 DIAGNOSIS — E1129 Type 2 diabetes mellitus with other diabetic kidney complication: Secondary | ICD-10-CM | POA: Diagnosis not present

## 2020-02-07 DIAGNOSIS — I129 Hypertensive chronic kidney disease with stage 1 through stage 4 chronic kidney disease, or unspecified chronic kidney disease: Secondary | ICD-10-CM | POA: Diagnosis not present

## 2020-02-08 ENCOUNTER — Other Ambulatory Visit: Payer: Self-pay | Admitting: Nephrology

## 2020-02-08 DIAGNOSIS — N1832 Chronic kidney disease, stage 3b: Secondary | ICD-10-CM

## 2020-02-09 ENCOUNTER — Other Ambulatory Visit: Payer: Self-pay | Admitting: Family Medicine

## 2020-02-09 DIAGNOSIS — M170 Bilateral primary osteoarthritis of knee: Secondary | ICD-10-CM

## 2020-02-09 DIAGNOSIS — G8929 Other chronic pain: Secondary | ICD-10-CM

## 2020-02-09 DIAGNOSIS — M25569 Pain in unspecified knee: Secondary | ICD-10-CM

## 2020-02-13 ENCOUNTER — Other Ambulatory Visit: Payer: Self-pay | Admitting: Family Medicine

## 2020-02-13 ENCOUNTER — Telehealth: Payer: Self-pay

## 2020-02-13 ENCOUNTER — Ambulatory Visit (INDEPENDENT_AMBULATORY_CARE_PROVIDER_SITE_OTHER): Payer: Medicare Other

## 2020-02-13 DIAGNOSIS — I5022 Chronic systolic (congestive) heart failure: Secondary | ICD-10-CM | POA: Diagnosis not present

## 2020-02-13 DIAGNOSIS — Z95810 Presence of automatic (implantable) cardiac defibrillator: Secondary | ICD-10-CM | POA: Diagnosis not present

## 2020-02-13 NOTE — Progress Notes (Signed)
EPIC Encounter for ICM Monitoring  Patient Name: MORSE BRUEGGEMANN is a 59 y.o. male Date: 02/13/2020 Primary Care Physican: Dickie La, MD Primary [place] Electrophysiologist: Caryl Comes 6/29/2021Weight:320lbs  Attempted call to patient and unable to reach and recording stated call cannot be completed at this time.  Transmission reviewed.  Per 01/31/2020 Epic phone note by Dr [name], patient is living in a hotel at last contact and phone number is not working.  Optivol thoracic impedancesuggesting possible fluid accumulation since 10/25 and fluid index above threshold starting 02/08/2020.  Prescribed:Furosemide 40 mgTake 40 mg by mouth daily as needed for fluid.  Labs: 06/28/2019 Creatinine 1.95, BUN 23, Potassium 4.5, Sodium 140, GFR 06/22/2019 Creatinine 1.66, BUN 20, Potassium 4.7, Sodium 142, GFR 44-51  Recommendations:Unable to reach.    Follow-up plan: ICM clinic phone appointment on11/22/2021 to recheck fluid levels.91 day device clinic remote transmission9/12/2019.   Copy of ICM check sent to Dr.Klein and [name].   3 month ICM trend: 02/13/2020    1 Year ICM trend:       Rosalene Billings, RN 02/13/2020 1:28 PM

## 2020-02-13 NOTE — Telephone Encounter (Signed)
Remote ICM transmission received.  Attempted call to patient regarding ICM remote transmission and recording stated call cannot be completed at this time.   

## 2020-02-28 ENCOUNTER — Telehealth: Payer: Self-pay

## 2020-02-28 NOTE — Telephone Encounter (Signed)
[name] calls nurse line requesting information on gout prescription. Aldona Bar states he is unsure what he is supposed to be taking for gout. Allopurinol is on his med list at 100mg , taking half. Aldona Bar reports she has moved away from [name] and is no longer caring for him. If Allopurinol is the medication can we send in 50mg , as he can not see to cut in half. I asked Aldona Bar who is caring for him now and she reported her son. Will forward to PCP.

## 2020-03-03 ENCOUNTER — Ambulatory Visit (HOSPITAL_COMMUNITY)
Admission: EM | Admit: 2020-03-03 | Discharge: 2020-03-03 | Disposition: A | Discharge status: Home / Self Care | Payer: Medicare Other | Attending: Family Medicine | Admitting: Family Medicine

## 2020-03-03 ENCOUNTER — Other Ambulatory Visit: Payer: Self-pay

## 2020-03-03 ENCOUNTER — Encounter (HOSPITAL_COMMUNITY): Payer: Self-pay

## 2020-03-03 DIAGNOSIS — M109 Gout, unspecified: Secondary | ICD-10-CM | POA: Diagnosis not present

## 2020-03-03 DIAGNOSIS — N183 Chronic kidney disease, stage 3 unspecified: Secondary | ICD-10-CM

## 2020-03-03 DIAGNOSIS — M79672 Pain in left foot: Secondary | ICD-10-CM | POA: Diagnosis not present

## 2020-03-03 DIAGNOSIS — E1122 Type 2 diabetes mellitus with diabetic chronic kidney disease: Secondary | ICD-10-CM | POA: Diagnosis not present

## 2020-03-03 LAB — CBG MONITORING, ED: Glucose-Capillary: 142 mg/dL — ABNORMAL HIGH (ref 70–99)

## 2020-03-03 MED ORDER — DICLOFENAC SODIUM 1 % EX GEL: 2.0000 g | Freq: Four times a day (QID) | CUTANEOUS | 0 refills | Status: DC

## 2020-03-03 MED ORDER — DEXAMETHASONE SODIUM PHOSPHATE 10 MG/ML IJ SOLN
INTRAMUSCULAR | Status: AC
  Filled 2020-03-03: qty 1

## 2020-03-03 MED ORDER — DEXAMETHASONE SODIUM PHOSPHATE 10 MG/ML IJ SOLN
10.0000 mg | Freq: Once | INTRAMUSCULAR | Status: AC
  Administered 2020-03-03: 10 mg via INTRAMUSCULAR

## 2020-03-03 NOTE — ED Provider Notes (Signed)
[name]   [id number] 03/03/20 Arrival Time: 1006  JY:NWGNF PAIN  SUBJECTIVE: History from: patient. [name] is a 59 y.o. male complains of left foot pain that began 4 weeks ago.  Reports that he is out of allopurinol.  Reports that he took the whole home over the last 4 weeks and has not had any improvement.  Patient has complex medical history including uncontrolled diabetes, hypertension, stage III chronic kidney disease, heart failure, also has presence of cardioverter and defibrillator. Reports history of gout as well.  Has prescription for hydrocodone 5-325 and had it filled on 02/11/2020 for 150 tablets. Symptoms are made worse with activity.  Has not been checking blood sugars at home.  Denies similar symptoms in the past.  Denies fever, chills, erythema, ecchymosis, weakness, numbness and tingling, saddle paresthesias, loss of bowel or bladder function.      ROS: As per HPI.  All other pertinent ROS negative.     Past Medical History:  Diagnosis Date  . Arthritis   . Cataracts, bilateral    right  . Congestive heart failure ([place])    a. s/p MDT single chamber ICD   . Diabetes mellitus    does not check blood sugars at home  . Diverticulosis   . Glaucoma    bilateral  . H/O TIA (transient ischemic attack) and stroke   . Hyperlipidemia   . Hypertension   . Obesity    Past Surgical History:  Procedure Laterality Date  . CARDIAC CATHETERIZATION  10/24/2003   EF of 45-50%  . CARDIAC DEFIBRILLATOR PLACEMENT  2008  . CATARACT EXTRACTION  2012   right  . EP IMPLANTABLE DEVICE N/A 05/30/2015   Procedure: ICD Generator Changeout;  Surgeon: [name], MD;  Location: [facility];  Service: Cardiovascular;  Laterality: N/A;  . KNEE SURGERY Right 2000  . SHOULDER SURGERY  2012   left   Allergies  Allergen Reactions  . Bee Pollen Anaphylaxis, Itching and Swelling  . Nsaids     CKD and CHF   No current facility-administered medications on  file prior to encounter.   Current Outpatient Medications on File Prior to Encounter  Medication Sig Dispense Refill  . HYDROcodone-acetaminophen (NORCO/VICODIN) 5-325 MG tablet Take by mouth 2 in AM 1 at midday and 2 at bedtime for chronic knee pain OK to fill 28 days after last prescription 150 tablet 0  . allopurinol (ZYLOPRIM) 100 MG tablet Take 0.5 tablets (50 mg total) by mouth daily. 45 tablet 3  . blood glucose meter kit and supplies Dispense based on patient and insurance preference. Use up to four times daily as directed. (FOR ICD-10 E10.9, E11.9). 1 each 0  . Blood Glucose Monitoring Suppl (ONETOUCH VERIO) w/Device KIT 1 kit by Does not apply route as directed. Use up to four times daily as directed. (FOR ICD-10 E10.9, E11.9). 1 kit 3  . brimonidine (ALPHAGAN) 0.2 % ophthalmic solution Place 1 drop into both eyes 2 (two) times daily.    . carvedilol (COREG) 25 MG tablet Take 1 tablet (25 mg total) by mouth 2 (two) times daily with a meal.    . cyclobenzaprine (FLEXERIL) 5 MG tablet Take 1 tablet (5 mg total) by mouth at bedtime. 30 tablet 1  . dorzolamide-timolol (COSOPT) 22.3-6.8 MG/ML ophthalmic solution Place 1 drop into both eyes 2 (two) times daily.    Marland Kitchen ELIQUIS 5 MG TABS tablet Take 1 tablet (5 mg total) by mouth 2 (two)  times daily. 180 tablet 0  . EPINEPHrine 0.3 mg/0.3 mL IJ SOAJ injection INJECT (0.52m) into musle AS NEEDED (Patient taking differently: Inject 0.3 mg into the muscle as needed for anaphylaxis. ) 2 each 0  . furosemide (LASIX) 40 MG tablet Take 1 tablet (40 mg total) by mouth as directed. (Patient taking differently: Take 40 mg by mouth daily as needed for fluid. ) 90 tablet 0  . glucose blood (ONETOUCH VERIO) test strip Use as instructed. Use up to four times daily as directed. (FOR ICD-10 E10.9, E11.9). 100 each 12  . insulin glargine (LANTUS SOLOSTAR) 100 UNIT/ML Solostar Pen Inject 25 units into skin once daily 27 mL 3  . isosorbide mononitrate (ISMO) 20 MG  tablet Take 1 tablet (20 mg total) by mouth 2 (two) times daily. 180 tablet 3  . latanoprost (XALATAN) 0.005 % ophthalmic solution Place 1 drop into both eyes every evening.    .Marland KitchenNARCAN 4 MG/0.1ML LIQD nasal spray kit     . nitroGLYCERIN (NITROSTAT) 0.4 MG SL tablet Place 0.4 mg under the tongue as needed.    . rosuvastatin (CRESTOR) 10 MG tablet Take 1 tablet (10 mg total) by mouth daily. 90 tablet 3  . rosuvastatin (CRESTOR) 10 MG tablet Take 1 tablet (10 mg total) by mouth daily. Replaces lipitor 90 tablet 3  . sacubitril-valsartan (ENTRESTO) 49-51 MG Take 1 tablet by mouth 2 (two) times daily.    .Marland Kitchenspironolactone (ALDACTONE) 25 MG tablet Take 25 mg by mouth 2 (two) times daily.     . SURE COMFORT PEN NEEDLES 32G X 6 MM MISC     . TRUEPLUS INSULIN SYRINGE 31G X 5/16" 0.3 ML MISC     . [DISCONTINUED] lisinopril (PRINIVIL,ZESTRIL) 40 MG tablet Take 40 mg by mouth 2 (two) times daily.       Social History   Socioeconomic History  . Marital status: Legally Separated    Spouse name: Not on file  . Number of children: Not on file  . Years of education: Not on file  . Highest education level: Not on file  Occupational History  . Not on file  Tobacco Use  . Smoking status: Never Smoker  . Smokeless tobacco: Never Used  Vaping Use  . Vaping Use: Never used  Substance and Sexual Activity  . Alcohol use: No  . Drug use: No  . Sexual activity: Not on file  Other Topics Concern  . Not on file  Social History Narrative  . Not on file   Social Determinants of Health   Financial Resource Strain:   . Difficulty of Paying Living Expenses: Not on file  Food Insecurity:   . Worried About RCharity fundraiserin the Last Year: Not on file  . Ran Out of Food in the Last Year: Not on file  Transportation Needs:   . Lack of Transportation (Medical): Not on file  . Lack of Transportation (Non-Medical): Not on file  Physical Activity:   . Days of Exercise per Week: Not on file  . Minutes of  Exercise per Session: Not on file  Stress:   . Feeling of Stress : Not on file  Social Connections:   . Frequency of Communication with Friends and Family: Not on file  . Frequency of Social Gatherings with Friends and Family: Not on file  . Attends Religious Services: Not on file  . Active Member of Clubs or Organizations: Not on file  . Attends CArchivistMeetings:  Not on file  . Marital Status: Not on file  Intimate Partner Violence:   . Fear of Current or Ex-Partner: Not on file  . Emotionally Abused: Not on file  . Physically Abused: Not on file  . Sexually Abused: Not on file   Family History  Problem Relation Age of Onset  . Colon cancer Father   . Esophageal cancer Neg Hx   . Rectal cancer Neg Hx   . Stomach cancer Neg Hx     OBJECTIVE:  Vitals:   03/03/20 1030  BP: (!) 154/106  Pulse: 78  Resp: 18  Temp: 98.2 F (36.8 C)  TempSrc: Oral  SpO2: 97%    General appearance: ALERT; in no acute distress.  Head: NCAT Lungs: Normal respiratory effort CV:  pulses 2+ bilaterally. Cap refill < 2 seconds Musculoskeletal:  Inspection: Skin warm, dry, clear and intact without obvious erythema, effusion, or ecchymosis.  Palpation: Base of right great toe tender to palpation ROM: Limited ROM active and passive to left foot Skin: warm and dry Neurologic: Ambulates without difficulty; Sensation intact about the upper/ lower extremities Psychological: alert and cooperative; normal mood and affect  DIAGNOSTIC STUDIES:  No results found.   ASSESSMENT & PLAN:  1. Acute gout of left foot, unspecified cause   2. Left foot pain   3. Type 2 diabetes mellitus with stage 3 chronic kidney disease, without long-term current use of insulin, unspecified whether stage 3a or 3b CKD ([place])     Meds ordered this encounter  Medications  . dexamethasone (DECADRON) injection 10 mg  . diclofenac Sodium (VOLTAREN) 1 % GEL    Sig: Apply 2 g topically 4 (four) times daily.     Dispense:  100 g    Refill:  0    Order Specific Question:   Supervising Provider    Answer:   Chase Picket [[id number]]    Given patient's kidney function a month ago and diabetes, we are limited in treatment Blood sugar 142 nonfasting in office today Decadron 10 mg IM in office today Voltaren gel prescribed Apply topically x4 daily as needed Continue conservative management of rest, ice, and gentle stretches Take home Norco as prescribed  Follow up with PCP if symptoms persist Return or go to the ER if you have any new or worsening symptoms (fever, chills, chest pain, abdominal pain, changes in bowel or bladder habits, pain radiating into lower legs)   [place] Controlled Substances Registry consulted for this patient. I feel the risk/benefit ratio today is favorable for proceeding with this prescription for a controlled substance. Medication sedation precautions given.  Reviewed expectations re: course of current medical issues. Questions answered. Outlined signs and symptoms indicating need for more acute intervention. Patient verbalized understanding. After Visit Summary given.       Faustino Congress, NP 03/03/20 1106

## 2020-03-03 NOTE — ED Triage Notes (Signed)
Pt present left foot swelling and pain. Pt states he has a history of gout. Pt states symptoms started 4 wks ago. Pt is out of his medication for gout. Pt state he has swelling all around his toes.

## 2020-03-03 NOTE — Discharge Instructions (Addendum)
I have sent in diclofenac topical gel for you to apply to the affected area up to 4 times a day as needed  You have received a steroid injection in the office today to help with inflammation  I have attached information about gout and how to eat to avoid flareups  Your blood sugar was 142 in the office today, we cannot do any NSAIDs given your kidney function.    Follow up with this office or with primary care if symptoms are persisting.  Follow up in the ER for high fever, trouble swallowing, trouble breathing, other concerning symptoms.

## 2020-03-08 ENCOUNTER — Other Ambulatory Visit: Payer: Self-pay | Admitting: Family Medicine

## 2020-03-08 DIAGNOSIS — S39012A Strain of muscle, fascia and tendon of lower back, initial encounter: Secondary | ICD-10-CM

## 2020-03-09 ENCOUNTER — Other Ambulatory Visit: Payer: Self-pay | Admitting: Family Medicine

## 2020-03-09 DIAGNOSIS — M170 Bilateral primary osteoarthritis of knee: Secondary | ICD-10-CM

## 2020-03-09 DIAGNOSIS — G8929 Other chronic pain: Secondary | ICD-10-CM

## 2020-03-16 NOTE — Progress Notes (Signed)
No ICM remote transmission received for 03/15/2020 and next ICM transmission scheduled for 04/09/2020.

## 2020-03-26 ENCOUNTER — Other Ambulatory Visit: Payer: Medicare Other

## 2020-04-09 ENCOUNTER — Ambulatory Visit (INDEPENDENT_AMBULATORY_CARE_PROVIDER_SITE_OTHER): Payer: Medicare Other

## 2020-04-09 DIAGNOSIS — Z95810 Presence of automatic (implantable) cardiac defibrillator: Secondary | ICD-10-CM

## 2020-04-09 DIAGNOSIS — I5022 Chronic systolic (congestive) heart failure: Secondary | ICD-10-CM | POA: Diagnosis not present

## 2020-04-10 ENCOUNTER — Telehealth: Payer: Self-pay | Admitting: Family Medicine

## 2020-04-10 ENCOUNTER — Other Ambulatory Visit: Payer: Self-pay

## 2020-04-10 ENCOUNTER — Telehealth: Payer: Self-pay

## 2020-04-10 DIAGNOSIS — M170 Bilateral primary osteoarthritis of knee: Secondary | ICD-10-CM

## 2020-04-10 DIAGNOSIS — G8929 Other chronic pain: Secondary | ICD-10-CM

## 2020-04-10 MED ORDER — HYDROCODONE-ACETAMINOPHEN 5-325 MG PO TABS: ORAL_TABLET | ORAL | 0 refills | Status: DC

## 2020-04-10 NOTE — Telephone Encounter (Signed)
Refill request sent to Dr. Nori Riis. [name], [name]

## 2020-04-10 NOTE — Telephone Encounter (Signed)
Remote ICM transmission received.  Attempted call to patient regarding ICM remote transmission and no answer.  

## 2020-04-10 NOTE — Progress Notes (Signed)
EPIC Encounter for ICM Monitoring  Patient Name: [name] is a 60 y.o. male Date: 04/10/2020 Primary Care Physican: Dickie La, MD Primary [place] Electrophysiologist: Caryl Comes 10/13/2021Office Weight:295lbs  Attempted call to patient and unable to reach.   Transmission reviewed.   Optivol thoracic impedancesuggestingpossible fluid accumulation since 10/25 and fluid index above threshold starting 02/08/2020.  Prescribed:Furosemide 40 mgTake 40 mg by mouth daily as needed for fluid.  Labs: 01/18/2020 Creatinine 2.14, BUN 18, Potassium 4.5, Sodium 138, GFR 32-37  12/28/2019 Creatinine 2.03, BUN 21, Potassium 4.8, Sodium 133, GFR 34-40  A complete set of results can be found in Results Review.  Recommendations:Unable to reach.    Follow-up plan: ICM clinic phone appointment on1/02/2021 to recheck fluid levels.91 day device clinic remote transmission2/15/2022.         EP/Cardiology Office Visits: 05/22/2020 with Dr. Caryl Comes  Copy of ICM check sent to Dr.Klein and [name].   Message sent to Dr Nori Riis as Juluis Rainier since patient has office visit with her on 04/11/2020.  3 month ICM trend: 04/09/2020.    1 Year ICM trend:      Rosalene Billings, RN 04/10/2020 12:08 PM

## 2020-04-10 NOTE — Telephone Encounter (Signed)
Patient's wife called and wanted me to put a note in patients chart for a refill on his Hydrocodone, states it was suppose to be done a week ago but with the holiday they forgot to call it in. She refused to leave a message on the nurse line. Please advise. thanks

## 2020-04-11 ENCOUNTER — Encounter: Payer: Self-pay | Admitting: Family Medicine

## 2020-04-11 ENCOUNTER — Ambulatory Visit (INDEPENDENT_AMBULATORY_CARE_PROVIDER_SITE_OTHER): Payer: Medicare Other | Admitting: Family Medicine

## 2020-04-11 ENCOUNTER — Other Ambulatory Visit: Payer: Self-pay

## 2020-04-11 VITALS — BP 124/84 | HR 78 | Ht 62.0 in | Wt 290.0 lb

## 2020-04-11 DIAGNOSIS — M7741 Metatarsalgia, right foot: Secondary | ICD-10-CM | POA: Diagnosis not present

## 2020-04-11 DIAGNOSIS — I5022 Chronic systolic (congestive) heart failure: Secondary | ICD-10-CM

## 2020-04-11 DIAGNOSIS — M7742 Metatarsalgia, left foot: Secondary | ICD-10-CM | POA: Diagnosis not present

## 2020-04-11 DIAGNOSIS — N184 Chronic kidney disease, stage 4 (severe): Secondary | ICD-10-CM | POA: Diagnosis not present

## 2020-04-11 DIAGNOSIS — N183 Chronic kidney disease, stage 3 unspecified: Secondary | ICD-10-CM

## 2020-04-11 DIAGNOSIS — M1A39X0 Chronic gout due to renal impairment, multiple sites, without tophus (tophi): Secondary | ICD-10-CM

## 2020-04-11 DIAGNOSIS — E1122 Type 2 diabetes mellitus with diabetic chronic kidney disease: Secondary | ICD-10-CM | POA: Diagnosis not present

## 2020-04-11 DIAGNOSIS — N1831 Chronic kidney disease, stage 3a: Secondary | ICD-10-CM

## 2020-04-11 DIAGNOSIS — I4821 Permanent atrial fibrillation: Secondary | ICD-10-CM

## 2020-04-11 LAB — POCT GLYCOSYLATED HEMOGLOBIN (HGB A1C): Hemoglobin A1C: 8.6 % — AB (ref 4.0–5.6)

## 2020-04-11 NOTE — Patient Instructions (Signed)
Your A1C is much better. Keep up the good work! Let me see you in 6-8 weeks!  Here is some info about GOUT An attack of acute gout usually happens in just one joint. The most common place is the big toe. Attacks often start at night. Other joints that may be affected include joints of the feet, ankle, knee, fingers, wrist, or elbow. Symptoms of an attack may include:  Very bad pain.  Warmth.  Swelling.  During a gout attack   If told, put ice on the painful area: ? Put ice in a plastic bag. ? Place a towel between your skin and the bag. ? Leave the ice on for 20 minutes, 2-3 times a day.  Raise (elevate) the painful joint above the level of your heart as often as you can.  Rest the joint as much as possible. If the joint is in your leg, you may be given crutches.  Follow instructions from your doctor about what you cannot eat or drink. Avoiding future gout attacks  Eat a low-purine diet. Avoid foods and drinks such as: ? Liver. ? Kidney. ? Anchovies. ? Asparagus. ? Herring. ? Mushrooms. ? Mussels. ? Beer. ? PORK ? Ham ? Shrimp, lobsters, crab    Start or continue an exercise plan as told by your doctor. GSummary  Gout is painful swelling of the joints.  The most common site of pain is the big toe, but it can affect other joints.  Medicines and avoiding some foods can help to prevent and treat gout attacks.

## 2020-04-12 ENCOUNTER — Encounter: Payer: Self-pay | Admitting: Family Medicine

## 2020-04-12 DIAGNOSIS — M7742 Metatarsalgia, left foot: Secondary | ICD-10-CM

## 2020-04-12 DIAGNOSIS — M7741 Metatarsalgia, right foot: Secondary | ICD-10-CM | POA: Insufficient documentation

## 2020-04-12 DIAGNOSIS — M1A39X0 Chronic gout due to renal impairment, multiple sites, without tophus (tophi): Secondary | ICD-10-CM | POA: Insufficient documentation

## 2020-04-12 HISTORY — DX: Metatarsalgia, right foot: M77.41

## 2020-04-12 HISTORY — DX: Metatarsalgia, left foot: M77.42

## 2020-04-12 HISTORY — DX: Chronic gout due to renal impairment, multiple sites, without tophus (tophi): M1A.39X0

## 2020-04-12 NOTE — Assessment & Plan Note (Signed)
A1C with significant improvement over last visit. I suspect having stable housing has also helped. He is Advertising account executive. Continue current meds and f/u for DM in 2-3 months.

## 2020-04-12 NOTE — Assessment & Plan Note (Signed)
He seems to think all of his foot pain is related to gout but I suspect there is a combination of arch loss, obesity contributing to PT dysfunction and metatarsalgia. He also needs nail care and diabetic  as he has dystrophic nails and pre-ulcerative callous B feet. Refer to podiatry.

## 2020-04-12 NOTE — Assessment & Plan Note (Signed)
Sounds like he did have a gout flair in November( at a lot of ham for Thanksgiving per him). I reviewed and printed out (for his family) foods he should avoid. Continue allopurinol.

## 2020-04-12 NOTE — Progress Notes (Signed)
    CHIEF COMPLAINT / HPI: 1. DM f/u: no episodes low blood sugar. Appetite stable. Taking meds. 2 Housing: has been able to get an apartment and no longer living in a hotel. Son and wife living with him. 3. Foot pain LEFT: /Gout? he thinks it is gout. Went to ED back in November and they gave him a shot which helped but he still has chronic pain. Pain is 2-3 / 10 now. On top of foot and around sides, not in joint. Some mild swelling per him and when it swells more he has pain under metatarsal area. Swelling intermittent but not daily. Taking his gout meds. Has questions about dietary restrictions.   PERTINENT  PMH / PSH: I have reviewed the patient's medications, allergies, past medical and surgical history, smoking status and updated in the EMR as appropriate.   OBJECTIVE:  BP 124/84   Pulse 78   Ht 5\' 2"  (1.575 m)   Wt 290 lb (131.5 kg)   SpO2 98%   BMI 53.04 kg/m  Vital signs reviewed. GENERAL: Well-developed, well-nourished, no acute distress. CARDIOVASCULAR: distant heart sounds. Irreg Ireg rhythm. LUNGS: Clear to auscultation bilaterally, no rales or wheeze. ABDOMEN: Soft positive bowel sounds. Obese.  NEURO: blind bilaterally. Uses regular cane and needs assistance for visual reasons. MSK: Movement of extremity x 4.Left foot not swollen, mildly ttp top and course of PTT. No joint swelling. TTP under MT heads, no callous here but some callous on heels.  Vasc: DP PT pulses 1-2+B=.    ASSESSMENT / PLAN:   Type 2 diabetes mellitus with stage 4 chronic kidney disease, without long-term current use of insulin (HCC) A1C with significant improvement over last visit. I suspect having stable housing has also helped. He is Advertising account executive. Continue current meds and f/u for DM in 2-3 months.  RENAL DISEASE, CHRONIC, STAGE III Will continue to follow his CKD and monitor his renal meds. He did get in to see the nephrologist and their notes are reviewed and scanned to chart.  Chronic  systolic heart failure ([place]) He seems realtively euvolemic today with no rales and no significant lower extremity edema. Respiratory work seems normal. I had received a note from the cardiology device team indicating they were having remote readings consistent with possible fluid overload. I do not see any specific signs of that today although his large abdomen could hide significant fluid accumulation: there is no notable fluid wave and this has been a stable part of his exam for some time.  Labs at next visit. Continue current meds. Will send not to cards.    Chronic gout due to renal impairment of multiple sites without tophus Sounds like he did have a gout flair in November( at a lot of ham for Thanksgiving per him). I reviewed and printed out (for his family) foods he should avoid. Continue allopurinol.   Metatarsalgia of both feet He seems to think all of his foot pain is related to gout but I suspect there is a combination of arch loss, obesity contributing to PT dysfunction and metatarsalgia. He also needs nail care and diabetic  as he has dystrophic nails and pre-ulcerative callous B feet. Refer to podiatry.   [name] MD

## 2020-04-12 NOTE — Assessment & Plan Note (Signed)
Will continue to follow his CKD and monitor his renal meds. He did get in to see the nephrologist and their notes are reviewed and scanned to chart.

## 2020-04-12 NOTE — Assessment & Plan Note (Signed)
He seems realtively euvolemic today with no rales and no significant lower extremity edema. Respiratory work seems normal. I had received a note from the cardiology device team indicating they were having remote readings consistent with possible fluid overload. I do not see any specific signs of that today although his large abdomen could hide significant fluid accumulation: there is no notable fluid wave and this has been a stable part of his exam for some time.  Labs at next visit. Continue current meds. Will send not to cards.

## 2020-04-16 DIAGNOSIS — I420 Dilated cardiomyopathy: Secondary | ICD-10-CM | POA: Diagnosis not present

## 2020-04-16 DIAGNOSIS — E119 Type 2 diabetes mellitus without complications: Secondary | ICD-10-CM | POA: Diagnosis not present

## 2020-04-16 DIAGNOSIS — R55 Syncope and collapse: Secondary | ICD-10-CM | POA: Diagnosis not present

## 2020-04-16 DIAGNOSIS — I5020 Unspecified systolic (congestive) heart failure: Secondary | ICD-10-CM | POA: Diagnosis not present

## 2020-04-16 DIAGNOSIS — E785 Hyperlipidemia, unspecified: Secondary | ICD-10-CM | POA: Diagnosis not present

## 2020-04-16 DIAGNOSIS — I472 Ventricular tachycardia: Secondary | ICD-10-CM | POA: Diagnosis not present

## 2020-04-16 DIAGNOSIS — I10 Essential (primary) hypertension: Secondary | ICD-10-CM | POA: Diagnosis not present

## 2020-04-17 ENCOUNTER — Ambulatory Visit (INDEPENDENT_AMBULATORY_CARE_PROVIDER_SITE_OTHER): Payer: Medicare Other

## 2020-04-17 ENCOUNTER — Telehealth: Payer: Self-pay

## 2020-04-17 DIAGNOSIS — Z95810 Presence of automatic (implantable) cardiac defibrillator: Secondary | ICD-10-CM | POA: Diagnosis not present

## 2020-04-17 DIAGNOSIS — I5022 Chronic systolic (congestive) heart failure: Secondary | ICD-10-CM | POA: Diagnosis not present

## 2020-04-17 NOTE — Telephone Encounter (Signed)
Remote ICM transmission received.  Attempted call to patient regarding ICM remote transmission and no message.  °

## 2020-04-17 NOTE — Progress Notes (Signed)
EPIC Encounter for ICM Monitoring  Patient Name: DARVIN DIALS is a 60 y.o. male Date: 04/17/2020 Primary Care Physican: Dickie La, MD Primary [place] Electrophysiologist: Caryl Comes 1/5/2022Office Weight:290lbs  Attempted call to patient and unable to reach.   Transmission reviewed.   Per 04/11/20 epic note at Dr Verlon Au visit patient was not fluid overloaded by exam.  Patient was living in a hotel until recently.  He now lives with wife, son and daughter in law in an apartment.  Optivol thoracic impedancesuggestingpossible fluid accumulation since 10/25 and fluid index above threshold starting 02/08/2020.  Prescribed:Furosemide 40 mgTake 40 mg by mouth daily as needed for fluid.  Labs: 01/18/2020 Creatinine 2.14, BUN 18, Potassium 4.5, Sodium 138, GFR 32-37  12/28/2019 Creatinine 2.03, BUN 21, Potassium 4.8, Sodium 133, GFR 34-40  A complete set of results can be found in Results Review.  Recommendations:Unable to reach.   Will have patient call [name] if he develops fluid symptoms if patient is reached.  Follow-up plan: ICM clinic phone appointment on2/14/2022.91 day device clinic remote transmission2/15/2022.         EP/Cardiology Office Visits: 05/22/2020 with Dr. Rolinda Roan ICM check sent to Dr.Kleinand [name].     3 month ICM trend: 04/17/2020.    1 Year ICM trend:       Rosalene Billings, RN 04/17/2020 8:43 AM

## 2020-04-19 ENCOUNTER — Other Ambulatory Visit: Payer: Self-pay | Admitting: Family Medicine

## 2020-04-20 ENCOUNTER — Telehealth: Payer: Self-pay | Admitting: Internal Medicine

## 2020-04-20 NOTE — Telephone Encounter (Signed)
Wife of patient called. Patient sent a remote transmission on Wednesday but has not heard from [place] to know if everything is normal or not.  The Wife is concerned that the patient has fluid around his lungs.  Please call

## 2020-04-20 NOTE — Telephone Encounter (Signed)
Patient and wife report that he has had a dry cough over the past week and he was instructed by cardiologist to take Lasix 40 mg 2 tablets daily on Monday after visit. Patient reports he did not start the lasix 80 mg daily dose until Tuesday. Remote transmission shows elevated Optivol and decreasing thoracic impedance. Patient concerned about taking lasix 80 mg daily. reviewed transmission with Dr Caryl Comes and patient to continue Lasix 80 mg daily and send follow-up transmission on 04/23/20 prescribed Lasix dose. Patient notified and agrees with treatment plan.

## 2020-04-23 NOTE — Telephone Encounter (Signed)
Attempted call to patient to request updated remote transmission but no answer.

## 2020-04-24 ENCOUNTER — Other Ambulatory Visit: Payer: Medicare Other

## 2020-04-30 ENCOUNTER — Other Ambulatory Visit: Payer: Self-pay

## 2020-04-30 DIAGNOSIS — G8929 Other chronic pain: Secondary | ICD-10-CM

## 2020-04-30 DIAGNOSIS — M170 Bilateral primary osteoarthritis of knee: Secondary | ICD-10-CM

## 2020-04-30 DIAGNOSIS — M25569 Pain in unspecified knee: Secondary | ICD-10-CM

## 2020-04-30 MED ORDER — HYDROCODONE-ACETAMINOPHEN 5-325 MG PO TABS: ORAL_TABLET | ORAL | 0 refills | Status: DC

## 2020-05-04 ENCOUNTER — Other Ambulatory Visit: Payer: Self-pay | Admitting: Family Medicine

## 2020-05-07 ENCOUNTER — Other Ambulatory Visit: Payer: Self-pay

## 2020-05-07 ENCOUNTER — Ambulatory Visit (INDEPENDENT_AMBULATORY_CARE_PROVIDER_SITE_OTHER): Payer: Medicare Other | Admitting: Podiatry

## 2020-05-07 DIAGNOSIS — E119 Type 2 diabetes mellitus without complications: Secondary | ICD-10-CM | POA: Diagnosis not present

## 2020-05-07 DIAGNOSIS — M7742 Metatarsalgia, left foot: Secondary | ICD-10-CM

## 2020-05-07 DIAGNOSIS — M79674 Pain in right toe(s): Secondary | ICD-10-CM

## 2020-05-07 DIAGNOSIS — E1165 Type 2 diabetes mellitus with hyperglycemia: Secondary | ICD-10-CM | POA: Diagnosis not present

## 2020-05-07 DIAGNOSIS — M216X9 Other acquired deformities of unspecified foot: Secondary | ICD-10-CM | POA: Diagnosis not present

## 2020-05-07 DIAGNOSIS — B351 Tinea unguium: Secondary | ICD-10-CM | POA: Diagnosis not present

## 2020-05-07 DIAGNOSIS — M79675 Pain in left toe(s): Secondary | ICD-10-CM | POA: Diagnosis not present

## 2020-05-07 DIAGNOSIS — M7741 Metatarsalgia, right foot: Secondary | ICD-10-CM

## 2020-05-07 NOTE — Progress Notes (Signed)
Subjective:   Patient ID: [name], male   DOB: 60 y.o.   MRN: [id number]   HPI  *Blind* 60 year old male with past medical significant for type 2 diabetes with last A1c 8.6, presents the office today to discuss diabetic shoes as well as for diabetic foot exam.  He does have a history of gout with last flareup was November or December 2021.  He does get recurrent discomfort involving his feet as well.  He denies any open sores.  He does have neuropathy as well.  Also asking for the nails be trimmed which are thickened elongated he cannot trim himself.   Review of Systems  All other systems reviewed and are negative.  Past Medical History:  Diagnosis Date  . Arthritis   . Cataracts, bilateral    right  . Congestive heart failure ([place])    a. s/p MDT single chamber ICD   . Diabetes mellitus    does not check blood sugars at home  . Diverticulosis   . Glaucoma    bilateral  . H/O TIA (transient ischemic attack) and stroke   . Hyperlipidemia   . Hypertension   . Obesity     Past Surgical History:  Procedure Laterality Date  . CARDIAC CATHETERIZATION  10/24/2003   EF of 45-50%  . CARDIAC DEFIBRILLATOR PLACEMENT  2008  . CATARACT EXTRACTION  2012   right  . EP IMPLANTABLE DEVICE N/A 05/30/2015   Procedure: ICD Generator Changeout;  Surgeon: [name], MD;  Location: [facility];  Service: Cardiovascular;  Laterality: N/A;  . KNEE SURGERY Right 2000  . SHOULDER SURGERY  2012   left     Current Outpatient Medications:  .  cyclobenzaprine (FLEXERIL) 5 MG tablet, Take 1 tablet (5 mg total) by mouth at bedtime., Disp: 30 tablet, Rfl: 1 .  allopurinol (ZYLOPRIM) 100 MG tablet, Take 0.5 tablets (50 mg total) by mouth daily., Disp: 45 tablet, Rfl: 3 .  blood glucose meter kit and supplies, Dispense based on patient and insurance preference. Use up to four times daily as directed. (FOR ICD-10 E10.9, E11.9)., Disp: 1 each, Rfl: 0 .  Blood Glucose Monitoring Suppl  (ONETOUCH VERIO) w/Device KIT, 1 kit by Does not apply route as directed. Use up to four times daily as directed. (FOR ICD-10 E10.9, E11.9)., Disp: 1 kit, Rfl: 3 .  brimonidine (ALPHAGAN) 0.2 % ophthalmic solution, Place 1 drop into both eyes 2 (two) times daily., Disp: , Rfl:  .  carvedilol (COREG) 25 MG tablet, Take 1 tablet (25 mg total) by mouth 2 (two) times daily with a meal., Disp: , Rfl:  .  diclofenac Sodium (VOLTAREN) 1 % GEL, Apply 2 g topically 4 (four) times daily., Disp: 100 g, Rfl: 0 .  dorzolamide-timolol (COSOPT) 22.3-6.8 MG/ML ophthalmic solution, Place 1 drop into both eyes 2 (two) times daily., Disp: , Rfl:  .  ELIQUIS 5 MG TABS tablet, Take 1 tablet (5 mg total) by mouth 2 (two) times daily., Disp: 180 tablet, Rfl: 0 .  EPINEPHrine 0.3 mg/0.3 mL IJ SOAJ injection, INJECT (0.68m) into musle AS NEEDED (Patient taking differently: Inject 0.3 mg into the muscle as needed for anaphylaxis. ), Disp: 2 each, Rfl: 0 .  furosemide (LASIX) 40 MG tablet, Take 1 tablet (40 mg total) by mouth as directed. (Patient taking differently: Take 40 mg by mouth daily as needed for fluid. ), Disp: 90 tablet, Rfl: 0 .  glimepiride (AMARYL) 4 MG tablet, Take 4  mg by mouth daily., Disp: , Rfl:  .  glucose blood (ONETOUCH VERIO) test strip, Use as instructed. Use up to four times daily as directed. (FOR ICD-10 E10.9, E11.9)., Disp: 100 each, Rfl: 12 .  HYDROcodone-acetaminophen (NORCO/VICODIN) 5-325 MG tablet, Take 2 in AM one at mid day and 2 at bedtime prn knee pain, Disp: 150 tablet, Rfl: 0 .  insulin glargine (LANTUS SOLOSTAR) 100 UNIT/ML Solostar Pen, Inject 25 units into skin once daily, Disp: 27 mL, Rfl: 3 .  isosorbide mononitrate (ISMO) 20 MG tablet, Take 1 tablet (20 mg total) by mouth 2 (two) times daily., Disp: 180 tablet, Rfl: 3 .  latanoprost (XALATAN) 0.005 % ophthalmic solution, Place 1 drop into both eyes every evening., Disp: , Rfl:  .  NARCAN 4 MG/0.1ML LIQD nasal spray kit, , Disp: , Rfl:   .  nitroGLYCERIN (NITROSTAT) 0.4 MG SL tablet, Place 0.4 mg under the tongue as needed., Disp: , Rfl:  .  NOVOLOG 100 UNIT/ML injection, SMARTSIG:5 Unit(s) SUB-Q 3 Times Daily, Disp: , Rfl:  .  rosuvastatin (CRESTOR) 10 MG tablet, Take 1 tablet (10 mg total) by mouth daily., Disp: 90 tablet, Rfl: 3 .  rosuvastatin (CRESTOR) 10 MG tablet, Take 1 tablet (10 mg total) by mouth daily. Replaces lipitor, Disp: 90 tablet, Rfl: 3 .  sacubitril-valsartan (ENTRESTO) 49-51 MG, Take 1 tablet by mouth 2 (two) times daily., Disp: , Rfl:  .  spironolactone (ALDACTONE) 25 MG tablet, Take 25 mg by mouth 2 (two) times daily. , Disp: , Rfl:  .  SURE COMFORT PEN NEEDLES 32G X 6 MM MISC, 1 Units by Does not apply route in the morning, at noon, in the evening, and at bedtime., Disp: 100 each, Rfl: 1 .  TRUEPLUS INSULIN SYRINGE 31G X 5/16" 0.3 ML MISC, , Disp: , Rfl:   Allergies  Allergen Reactions  . Bee Pollen Anaphylaxis, Itching and Swelling  . Nsaids     CKD and CHF        Objective:  Physical Exam  General: AAO x3, NAD  Dermatological: Nails are hypertrophic, dystrophic, brittle, discolored, elongated 10. No surrounding redness or drainage. Tenderness nails 1-5 bilaterally. No open lesions or pre-ulcerative lesions are identified today.   Vascular: Dorsalis Pedis artery and Posterior Tibial artery pedal pulses are 2/4 bilateral with immedate capillary fill time. There is no pain with calf compression, swelling, warmth, erythema.   Neruologic: Sensation decreased with Semmes Weinstein monofilament  Musculoskeletal: Prominent metatarsal heads plantarly.  Muscular strength 5/5 in all groups tested bilateral.     Assessment:   60 year old male with uncontrolled type 2 diabetes with neuropathy; symptomatic onychomycosis    Plan:  -Treatment options discussed including all alternatives, risks, and complications -Etiology of symptoms were discussed -Nails debrided 10 without complications or  bleeding. -No evidence of gout at this time monitor for any flareups. -Recommend diabetic shoes.  I will have him follow-up with our pedorthotist, Liliane Channel for this.  -Daily foot inspection -Follow-up in 3 months or sooner if any problems arise. In the meantime, encouraged to call the office with any questions, concerns, change in symptoms.   Celesta Gentile, DPM

## 2020-05-08 NOTE — Telephone Encounter (Signed)
Transmission received 05/05/2020

## 2020-05-09 ENCOUNTER — Telehealth: Payer: Self-pay

## 2020-05-09 NOTE — Telephone Encounter (Signed)
Attempted ICM follow up call regarding remote transmission sent by patient on 05/05/2020 during time out of the office.  Attempted home/cell number and recording states call cannot go through.  It is invalid number.  Unable to reach patient at this time. Patient has 2/15 OV with Dr Caryl Comes and message sent to Dr Olin Pia nurse to obtain valid phone number and update DPR if needed.

## 2020-05-11 DIAGNOSIS — I5020 Unspecified systolic (congestive) heart failure: Secondary | ICD-10-CM | POA: Diagnosis not present

## 2020-05-11 DIAGNOSIS — E785 Hyperlipidemia, unspecified: Secondary | ICD-10-CM | POA: Diagnosis not present

## 2020-05-11 DIAGNOSIS — I472 Ventricular tachycardia: Secondary | ICD-10-CM | POA: Diagnosis not present

## 2020-05-11 DIAGNOSIS — R55 Syncope and collapse: Secondary | ICD-10-CM | POA: Diagnosis not present

## 2020-05-11 DIAGNOSIS — I10 Essential (primary) hypertension: Secondary | ICD-10-CM | POA: Diagnosis not present

## 2020-05-11 DIAGNOSIS — E119 Type 2 diabetes mellitus without complications: Secondary | ICD-10-CM | POA: Diagnosis not present

## 2020-05-11 DIAGNOSIS — I420 Dilated cardiomyopathy: Secondary | ICD-10-CM | POA: Diagnosis not present

## 2020-05-15 ENCOUNTER — Other Ambulatory Visit: Payer: Self-pay | Admitting: Family Medicine

## 2020-05-15 DIAGNOSIS — S39012A Strain of muscle, fascia and tendon of lower back, initial encounter: Secondary | ICD-10-CM

## 2020-05-21 ENCOUNTER — Ambulatory Visit (INDEPENDENT_AMBULATORY_CARE_PROVIDER_SITE_OTHER): Payer: Medicare Other

## 2020-05-21 DIAGNOSIS — I5022 Chronic systolic (congestive) heart failure: Secondary | ICD-10-CM

## 2020-05-21 DIAGNOSIS — Z95810 Presence of automatic (implantable) cardiac defibrillator: Secondary | ICD-10-CM

## 2020-05-22 ENCOUNTER — Ambulatory Visit
Admission: RE | Admit: 2020-05-22 | Discharge: 2020-05-22 | Disposition: A | Discharge status: Home / Self Care | Payer: Medicare Other | Source: Ambulatory Visit | Attending: Nephrology | Admitting: Nephrology

## 2020-05-22 ENCOUNTER — Telehealth: Payer: Self-pay

## 2020-05-22 ENCOUNTER — Encounter: Payer: Medicare Other | Admitting: Internal Medicine

## 2020-05-22 DIAGNOSIS — N1832 Chronic kidney disease, stage 3b: Secondary | ICD-10-CM

## 2020-05-22 NOTE — Telephone Encounter (Signed)
New Message:     Pt wanted to know if his transmission was received yesterday?

## 2020-05-22 NOTE — Progress Notes (Signed)
EPIC Encounter for ICM Monitoring  Patient Name: [name] is a 60 y.o. male Date: 05/22/2020 Primary Care Physican: Dickie La, MD [facility] [place] Electrophysiologist: Caryl Comes 1/5/2022OfficeWeight:290lbs  Spoke with wife per DPR.  She reports she was not with patient during months of Oct-Dec which may be the reason he started fluid accumulation.  She said they have now settled back into a place together with their son is helping with taking care of patient.  She reports [name] had patient take Furosemide 2 tablets twice a day x 7 days which ended on 2/11.     Patient has a scale at home but not been using.  Encouraged to record daily weights and weigh every morning before eating or drinking.   Optivol thoracic impedancesuggestingpossible fluid accumulation since 10/25 and fluid index above threshold starting 02/08/2020.   Impedance is starting to show improvement since 05/14/2020 since patient was taking Furosemide 2 tablets twice a day until 2/11.  Prescribed:Furosemide 40 mgTake 40 mg by mouth daily as needed for fluid.  Labs: 10/13/2021Creatinine 2.14, BUN18, Potassium4.5, Sodium138, [id number]  12/28/2019 Creatinine2.03, BUN21, Potassium4.8, Sodium133, [id number]  Acomplete set of results can be found in Results Review.  Recommendations: Advised to take Furosemide 1 tablet x 4 days and then return to PRN.  Advised to limit salt and fluid intake  Follow-up plan: ICM clinic phone appointment on2/21/2022 to recheck fluid levels.91 day device clinic remote transmission2/15/2022.   EP/Cardiology Office Visits:05/28/2020 with Tommye Standard, PA  Copyof ICM check sent to [place].  3 month ICM trend: 05/21/2020.    1 Year ICM trend:       Rosalene Billings, RN 05/22/2020 3:26 PM

## 2020-05-22 NOTE — Telephone Encounter (Signed)
See ICM Note 

## 2020-05-22 NOTE — Telephone Encounter (Signed)
I let the patient wife Aldona Bar know we did receive his transmission on 05/21/2020. I told him if the nurse review it and see anything she will give them a call back.

## 2020-05-23 NOTE — Progress Notes (Signed)
Cardiology Office Note Date:  05/23/2020  Patient ID:  [name], [name] 03/10/1961, MRN [id number] PCP:  Dickie La, MD  Cardiologist:  Dr. Terrence Dupont Electrophysiologist: Dr. Caryl Comes    Chief Complaint:   overdue annual visit  History of Present Illness: [name] is a 60 y.o. male with history of HTN, HLD, DM, TIA, NICM, chronic CHF (systolic), ICD, blindness 2/2 glaucoma, CKD (III)  He comes in today to be seen for Dr. Caryl Comes. Last seen by A. Lynnell Jude, NP for EP in Oct 2019, he was doing well, following with Dr. Terrence Dupont, no changes were made.  I saw him Oct 2020 He feels well.  Denies any CP, palpitations or cardiac awareness.  No SOB, does not exercise, but denies difficulties with ADLs.  No dizzy spells, no near syncope or syncope, no shocks He saw Dr. Terrence Dupont recently, states he does his labs as well as his PMD. He tells me he can see shadows, no detail Device function was intact, no changes were made.  Hospitalization June 2021, progressive SO, admitted for acute hypoxic respiratory failure initially thought secondary to acute on chronic systolic heart failure complicated by paroxysmal atrial fibrillation, more likely bronchitis after further evaluation. Also noted to have AKI. Discharged  09/17/19  He c/w Dr. Terrence Dupont, had an echo done a couple weeks ago, reported to him that his heart remains "stiff" had a leaky valve of no concern.  L. Short RN recently had him take his lasix daily for 4 days and resume PRN afterwards for elevated OptiVol. The patient denies SOB but does have a intermittent productive cough, no fever symptoms of illness. Denies noctural or positional SOB, no DOE> No CP, palpitations or cardiac awareness, no dizzy spells, near syncope or syncope.  He is blind, so uncertain on stool/urine, denies bleeding gums with brushing/nose bleeds etc.  Device History: MDT single chamber ICD implanted 2008 for NICM; gen change 2017 History of appropriate  therapy: No History of AAD therapy: No  Past Medical History:  Diagnosis Date  . Arthritis   . Arthritis of knee, degenerative 10/03/2011   Right > LEFT KNEE oa X-rays were not standing but showed some mild sclerosis and joint space loss of the medial compartment of the right knee. History of arthroscopy right knee. Report of injury as a child but unclear exactly what that was.  :I am aware of 12/01/18 Rx by Dr. Nori Riis.  While wife was hospitalized, narcotic Rx went missing when in-home sitter left.  To my knowledge, this is the first mi  . Automatic implantable cardioverter-defibrillator in situ 07/15/2013  . Blind in both eyes 06/15/2013   Right Sees Dr Katy Fitch  . Cataracts, bilateral    right  . Chronic gout due to renal impairment of multiple sites without tophus 04/12/2020  . Chronic systolic heart failure ([place]) 02/18/2007   Qualifier: Diagnosis of  By: Unk Lightning MD, Tivis Ringer   . Congestive heart failure ([place])    a. s/p MDT single chamber ICD   . Diabetes mellitus    does not check blood sugars at home  . Diverticulosis   . Encounter for chronic pain management 05/17/2014   Indication for chronic opioid: severe arthritis RIGHTknee / low back Medication and dose: hydrocodone---uses prn # pills per month: gets #120 pills to last TWO months Last UDS date: 05/17/2014 Pain contract signed (Y/N): Y 05/17/2014 Date narcotic database last reviewed (include red flags): 05/17/2014   . Glaucoma    bilateral  .  Glaucoma 11/07/2015   Severe See Dr. Katy Fitch  . H/O TIA (transient ischemic attack) and stroke   . Hyperlipidemia   . Hyperlipidemia with target low density lipoprotein (LDL) cholesterol less than 100 mg/dL 08/20/2011  . Hypertension   . HYPERTENSION, BENIGN 02/18/2007   Qualifier: Diagnosis of  By: Nori Riis MD, Clarise Cruz    . Metatarsalgia of both feet 04/12/2020  . NICM (nonischemic cardiomyopathy) ([place]) 05/30/2015  . Obesity   . OBESITY, MORBID 05/21/2010   Qualifier: Diagnosis of  By: Caryl Comes, MD, Leonidas Romberg Mack Guise   . Obstructive sleep apnea 03/27/2009   Qualifier: Diagnosis of  By: Unk Lightning, RN, BSN, Melanie    . Paroxysmal atrial fibrillation ([place]) 09/21/2019  . Paroxysmal VT ([place]) 06/22/2019  . RENAL DISEASE, CHRONIC, STAGE III 02/18/2007  . SHOULDER PAIN, LEFT 08/01/2009   History of left shoulder surgery.    . Single implantable cardioverter-defibrillator (ICD) in situ 06/17/2011  . Type 2 diabetes mellitus with stage 4 chronic kidney disease, without long-term current use of insulin ([place]) 08/25/2007    Past Surgical History:  Procedure Laterality Date  . CARDIAC CATHETERIZATION  10/24/2003   EF of 45-50%  . CARDIAC DEFIBRILLATOR PLACEMENT  2008  . CATARACT EXTRACTION  2012   right  . EP IMPLANTABLE DEVICE N/A 05/30/2015   Procedure: ICD Generator Changeout;  Surgeon: [name], MD;  Location: [facility];  Service: Cardiovascular;  Laterality: N/A;  . KNEE SURGERY Right 2000  . SHOULDER SURGERY  2012   left    Current Outpatient Medications  Medication Sig Dispense Refill  . allopurinol (ZYLOPRIM) 100 MG tablet Take 0.5 tablets (50 mg total) by mouth daily. 45 tablet 3  . blood glucose meter kit and supplies Dispense based on patient and insurance preference. Use up to four times daily as directed. (FOR ICD-10 E10.9, E11.9). 1 each 0  . Blood Glucose Monitoring Suppl (ONETOUCH VERIO) w/Device KIT 1 kit by Does not apply route as directed. Use up to four times daily as directed. (FOR ICD-10 E10.9, E11.9). 1 kit 3  . brimonidine (ALPHAGAN) 0.2 % ophthalmic solution Place 1 drop into both eyes 2 (two) times daily.    . carvedilol (COREG) 25 MG tablet Take 1 tablet (25 mg total) by mouth 2 (two) times daily with a meal.    . cyclobenzaprine (FLEXERIL) 5 MG tablet Take 1 tablet (5 mg total) by mouth at bedtime. 30 tablet 1  . diclofenac Sodium (VOLTAREN) 1 % GEL Apply 2 g topically 4 (four) times daily. 100 g 0  . dorzolamide-timolol (COSOPT) 22.3-6.8 MG/ML ophthalmic solution  Place 1 drop into both eyes 2 (two) times daily.    Marland Kitchen ELIQUIS 5 MG TABS tablet Take 1 tablet (5 mg total) by mouth 2 (two) times daily. 180 tablet 0  . EPINEPHrine 0.3 mg/0.3 mL IJ SOAJ injection INJECT (0.33m) into musle AS NEEDED (Patient taking differently: Inject 0.3 mg into the muscle as needed for anaphylaxis. ) 2 each 0  . furosemide (LASIX) 40 MG tablet Take 1 tablet (40 mg total) by mouth as directed. (Patient taking differently: Take 40 mg by mouth daily as needed for fluid. ) 90 tablet 0  . glimepiride (AMARYL) 4 MG tablet Take 4 mg by mouth daily.    .Marland Kitchenglucose blood (ONETOUCH VERIO) test strip Use as instructed. Use up to four times daily as directed. (FOR ICD-10 E10.9, E11.9). 100 each 12  . HYDROcodone-acetaminophen (NORCO/VICODIN) 5-325 MG tablet Take 2 in AM  one at mid day and 2 at bedtime prn knee pain 150 tablet 0  . insulin glargine (LANTUS SOLOSTAR) 100 UNIT/ML Solostar Pen Inject 25 units into skin once daily 27 mL 3  . isosorbide mononitrate (ISMO) 20 MG tablet Take 1 tablet (20 mg total) by mouth 2 (two) times daily. 180 tablet 3  . latanoprost (XALATAN) 0.005 % ophthalmic solution Place 1 drop into both eyes every evening.    Marland Kitchen NARCAN 4 MG/0.1ML LIQD nasal spray kit     . nitroGLYCERIN (NITROSTAT) 0.4 MG SL tablet Place 0.4 mg under the tongue as needed.    Marland Kitchen NOVOLOG 100 UNIT/ML injection SMARTSIG:5 Unit(s) SUB-Q 3 Times Daily    . rosuvastatin (CRESTOR) 10 MG tablet Take 1 tablet (10 mg total) by mouth daily. 90 tablet 3  . rosuvastatin (CRESTOR) 10 MG tablet Take 1 tablet (10 mg total) by mouth daily. Replaces lipitor 90 tablet 3  . sacubitril-valsartan (ENTRESTO) 49-51 MG Take 1 tablet by mouth 2 (two) times daily.    Marland Kitchen spironolactone (ALDACTONE) 25 MG tablet Take 25 mg by mouth 2 (two) times daily.     . SURE COMFORT PEN NEEDLES 32G X 6 MM MISC 1 Units by Does not apply route in the morning, at noon, in the evening, and at bedtime. 100 each 1  . TRUEPLUS INSULIN SYRINGE  31G X 5/16" 0.3 ML MISC      No current facility-administered medications for this visit.    Allergies:   Bee pollen and Nsaids   Social History:  The patient  reports that he has never smoked. He has never used smokeless tobacco. He reports that he does not drink alcohol and does not use drugs.   Family History:  The patient's family history includes Colon cancer in his father.  ROS:  Please see the history of present illness.  All other systems are reviewed and otherwise negative.   PHYSICAL EXAM:  VS:  There were no vitals taken for this visit. BMI: There is no height or weight on file to calculate BMI. Well nourished, well developed, in no acute distress  HEENT: normocephalic, atraumatic  Neck: no JVD, carotid bruits or masses Cardiac:  RRR; no significant murmurs, no rubs, or gallops Lungs:  CTA b/l, no wheezing, rhonchi or rales  Abd: soft, nontender, obese MS: no deformity or atrophy Ext: no edema  Skin: warm and dry, no rash Neuro:  No gross deficits appreciated Psych: euthymic mood, full affect   ICD site is stable, no tethering or discomfort   EKG:  Not done today  ICD interrogation done today and reviewed by myself:  Battery and lead measurements are good 32 NSVT episodes, available EGMs are reviewed, this appears to have been AF/RVR VS 99.3%   05/01/15: TTE Study Conclusions - Left ventricle: The cavity size was normal. There was moderate   focal basal hypertrophy of the septum. Systolic function was   mildly to moderately reduced. The estimated ejection fraction was   in the range of 40% to 45%. Diffuse hypokinesis. Doppler   parameters are consistent with abnormal left ventricular   relaxation (grade 1 diastolic dysfunction). - Aortic valve: Transvalvular velocity was within the normal range.   There was no stenosis. There was no regurgitation. - Mitral valve: Transvalvular velocity was within the normal range.   There was no evidence for stenosis. There  was no regurgitation. - Right ventricle: The cavity size was moderately dilated. Wall   thickness was normal. Systolic function  was normal. - Tricuspid valve: There was no regurgitation.    Recent Labs: 09/10/2019: B Natriuretic Peptide 18.9 09/21/2019: Hemoglobin 12.6; Platelets 264; TSH 3.250 01/18/2020: ALT 66; BUN 18; Creatinine, Ser 2.14; Potassium 4.5; Sodium 138  01/18/2020: Chol/HDL Ratio 5.1; Cholesterol, Total 137; HDL 27; LDL Chol Calc (NIH) 61; Triglycerides 314   CrCl cannot be calculated (Patient's most recent lab result is older than the maximum 21 days allowed.).   Wt Readings from Last 3 Encounters:  04/11/20 290 lb (131.5 kg)  01/18/20 295 lb 6.4 oz (134 kg)  12/28/19 (!) 301 lb 9.6 oz (136.8 kg)     Other studies reviewed: Additional studies/records reviewed today include: summarized above  ASSESSMENT AND PLAN:  1. ICD     Intact function, no programming changes made  2. NICM 3. Chronic CHF     No symptoms or exam findings to suggest volume OL currently     OptiVol has been peaked since Nov     On BB, entresto, nitrate, aldactone, lasix (is as needed)  OptiVol suggests marked volume OL He has a slight intermittent cough though no overt SOB or DOE Exam does not suggest overt volume and weight is trending downwards.  I have asked him to go ahead an take the lasix daily again for another 4 days and follow up with Dr. Terrence Dupont I will send my note, will get a BMET today given recent pulse of his lasix as well.  4. HTN     Looks OK, no changes  5. HLD     Monitored by Dr. Terrence Dupont, not addressed today  6. Paroxysmal Afib     CHA2DS2Vasc is 5, on eliquis, appropriately dosed for his age/weight     Disposition: c/w remotes Q 3 mo, ICM clinic, and Dr. Terrence Dupont, EP in clinic annually, sooner if needed  Current medicines are reviewed at length with the patient today.  The patient did not have any concerns regarding medicines.  Venetia Night,  PA-C 05/23/2020 12:44 PM     [place] [address] [phone] (office)  [phone] (fax)

## 2020-05-28 ENCOUNTER — Other Ambulatory Visit: Payer: Self-pay

## 2020-05-28 ENCOUNTER — Encounter: Payer: Self-pay | Admitting: Physician Assistant

## 2020-05-28 ENCOUNTER — Ambulatory Visit (INDEPENDENT_AMBULATORY_CARE_PROVIDER_SITE_OTHER): Payer: Medicare Other | Admitting: Physician Assistant

## 2020-05-28 ENCOUNTER — Ambulatory Visit (INDEPENDENT_AMBULATORY_CARE_PROVIDER_SITE_OTHER): Payer: Medicare Other

## 2020-05-28 ENCOUNTER — Telehealth: Payer: Self-pay | Admitting: Family Medicine

## 2020-05-28 VITALS — BP 112/82 | HR 88 | Ht 62.0 in | Wt 287.4 lb

## 2020-05-28 DIAGNOSIS — I10 Essential (primary) hypertension: Secondary | ICD-10-CM | POA: Diagnosis not present

## 2020-05-28 DIAGNOSIS — I5022 Chronic systolic (congestive) heart failure: Secondary | ICD-10-CM

## 2020-05-28 DIAGNOSIS — I480 Paroxysmal atrial fibrillation: Secondary | ICD-10-CM

## 2020-05-28 DIAGNOSIS — I428 Other cardiomyopathies: Secondary | ICD-10-CM | POA: Diagnosis not present

## 2020-05-28 DIAGNOSIS — G8929 Other chronic pain: Secondary | ICD-10-CM

## 2020-05-28 DIAGNOSIS — Z95810 Presence of automatic (implantable) cardiac defibrillator: Secondary | ICD-10-CM

## 2020-05-28 DIAGNOSIS — Z79899 Other long term (current) drug therapy: Secondary | ICD-10-CM

## 2020-05-28 DIAGNOSIS — M170 Bilateral primary osteoarthritis of knee: Secondary | ICD-10-CM

## 2020-05-28 LAB — CUP PACEART INCLINIC DEVICE CHECK
Battery Remaining Longevity: 82 mo
Battery Voltage: 2.99 V
Brady Statistic RV Percent Paced: 0.67 %
Date Time Interrogation Session: 20220221161310
HighPow Impedance: 45 Ohm
HighPow Impedance: 60 Ohm
Implantable Lead Implant Date: 20081114
Implantable Lead Location: 753860
Implantable Lead Model: 6947
Implantable Pulse Generator Implant Date: 20170222
Lead Channel Impedance Value: 380 Ohm
Lead Channel Impedance Value: 437 Ohm
Lead Channel Pacing Threshold Amplitude: 0.75 V
Lead Channel Pacing Threshold Pulse Width: 0.4 ms
Lead Channel Sensing Intrinsic Amplitude: 6.875 mV
Lead Channel Sensing Intrinsic Amplitude: 8 mV
Lead Channel Setting Pacing Amplitude: 2.5 V
Lead Channel Setting Pacing Pulse Width: 0.4 ms
Lead Channel Setting Sensing Sensitivity: 0.3 mV

## 2020-05-28 LAB — BASIC METABOLIC PANEL
BUN/Creatinine Ratio: 6 — ABNORMAL LOW (ref 10–24)
BUN: 9 mg/dL (ref 8–27)
CO2: 25 mmol/L (ref 20–29)
Calcium: 8.7 mg/dL (ref 8.6–10.2)
Chloride: 102 mmol/L (ref 96–106)
Creatinine, Ser: 1.52 mg/dL — ABNORMAL HIGH (ref 0.76–1.27)
GFR calc Af Amer: 56 mL/min/{1.73_m2} — ABNORMAL LOW (ref >59)
GFR calc non Af Amer: 49 mL/min/{1.73_m2} — ABNORMAL LOW (ref >59)
Glucose: 132 mg/dL — ABNORMAL HIGH (ref 65–99)
Potassium: 4.1 mmol/L (ref 3.5–5.2)
Sodium: 141 mmol/L (ref 134–144)

## 2020-05-28 MED ORDER — FUROSEMIDE 40 MG PO TABS: 40.0000 mg | ORAL_TABLET | Freq: Every day | ORAL | 0 refills | Status: DC | PRN

## 2020-05-28 NOTE — Telephone Encounter (Signed)
Patients wife is calling and would like to have patients hydrocodone refilled.

## 2020-05-28 NOTE — Patient Instructions (Addendum)
Medication Instructions:   FOR 4 DAYS  ONLY :  TAKE FUROSEMIDE(LASIX)  ONCE  A DAY  THEN  TAKE AS NEEDED   *If you need a refill on your cardiac medications before your next appointment, please call your pharmacy*   Lab Work: BMET  TODAY   If you have labs (blood work) drawn today and your tests are completely normal, you will receive your results only by: Marland Kitchen MyChart Message (if you have MyChart) OR . A paper copy in the mail If you have any lab test that is abnormal or we need to change your treatment, we will call you to review the results.   Testing/Procedures: NONE ORDERED  TODAY   Follow-Up: At [facility], you and your health needs are our priority.  As part of our continuing mission to provide you with exceptional heart care, we have created designated Provider Care Teams.  These Care Teams include your primary Cardiologist (physician) and Advanced Practice Providers (APPs -  Physician Assistants and Nurse Practitioners) who all work together to provide you with the care you need, when you need it.  We recommend signing up for the patient portal called "MyChart".  Sign up information is provided on this After Visit Summary.  MyChart is used to connect with patients for Virtual Visits (Telemedicine).  Patients are able to view lab/test results, encounter notes, upcoming appointments, etc.  Non-urgent messages can be sent to your provider as well.   To learn more about what you can do with MyChart, go to [email or web address].    Your next appointment:   1 year(s)  The format for your next appointment:   In Person  Provider:   You may see Dr. Evon Slack  or one of the following Advanced Practice Providers on your designated Care Team:    Chanetta Marshall, NP  Tommye Standard, [place]  Legrand Como "Jonni Sanger" Chalmers Cater, [place]    Other Instructions  FOLLOW UP WITH PRIMARY  [name] FOR FURTHER  MEDS AND FLUID MANAGMENT

## 2020-05-29 MED ORDER — HYDROCODONE-ACETAMINOPHEN 5-325 MG PO TABS: ORAL_TABLET | ORAL | 0 refills | Status: DC

## 2020-05-29 NOTE — Addendum Note (Signed)
Addended byDickie La on: 05/29/2020 03:32 PM   Modules accepted: Orders

## 2020-05-30 ENCOUNTER — Ambulatory Visit: Payer: Medicare Other | Admitting: Family Medicine

## 2020-05-30 NOTE — Progress Notes (Signed)
ICM Remote Transmission rescheduled for 06/04/2020

## 2020-06-04 ENCOUNTER — Telehealth: Payer: Self-pay

## 2020-06-04 ENCOUNTER — Ambulatory Visit (INDEPENDENT_AMBULATORY_CARE_PROVIDER_SITE_OTHER): Payer: Medicare Other

## 2020-06-04 DIAGNOSIS — Z95810 Presence of automatic (implantable) cardiac defibrillator: Secondary | ICD-10-CM

## 2020-06-04 DIAGNOSIS — I5022 Chronic systolic (congestive) heart failure: Secondary | ICD-10-CM

## 2020-06-04 NOTE — Telephone Encounter (Signed)
Remote ICM transmission received.  Attempted several calls to patient/wife regarding ICM remote transmission and line is busy.

## 2020-06-04 NOTE — Progress Notes (Signed)
EPIC Encounter for ICM Monitoring  Patient Name: [name] is a 60 y.o. male Date: 06/04/2020 Primary Care Physican: Adam La, MD Primary [place] Electrophysiologist: [name] 2/21/2022OfficeWeight:287lbs  Attempted call to wife/patient and unable to reach.  Transmission reviewed. Per 05/28/2020 OV with [name], pt was not overloaded by exam even though Optivol continue to suggest fluid accumulation.   Optivol thoracic impedancesuggestingpossible fluid accumulation since 01/30/2020 and fluid index above threshold starting 02/08/2020.  Impedance worsening since 05/14/2020.  Prescribed:Furosemide 40 mgTake 40 mg by mouth daily as needed for fluid.  Labs: 05/28/2020 Creatinine 1.52, BUN 9,   Potassium 4.1, Sodium 141, GFR 49-56 10/13/2021Creatinine 2.14, BUN18, Potassium4.5, Sodium138, [id number]  12/28/2019 Creatinine2.03, BUN21, Potassium4.8, Sodium133, [id number]  Acomplete set of results can be found in Results Review.  Recommendations: Unable to reach.    Follow-up plan: ICM clinic phone appointment on3/21/2022.91 day device clinic remote transmission3/01/2021.   EP/Cardiology Office Visits: 05/28/2021 with Adam Standard, Dalton or Dr [name] ICM check sent to [name] and Dr [name].   3 month ICM trend: 06/04/2020.    1 Year ICM trend:       Adam Billings, RN 06/04/2020 1:18 PM

## 2020-06-14 ENCOUNTER — Ambulatory Visit (INDEPENDENT_AMBULATORY_CARE_PROVIDER_SITE_OTHER): Payer: Medicare Other

## 2020-06-14 DIAGNOSIS — I480 Paroxysmal atrial fibrillation: Secondary | ICD-10-CM

## 2020-06-14 DIAGNOSIS — I428 Other cardiomyopathies: Secondary | ICD-10-CM

## 2020-06-18 LAB — CUP PACEART REMOTE DEVICE CHECK
Battery Remaining Longevity: 83 mo
Battery Voltage: 2.95 V
Brady Statistic RV Percent Paced: 0.03 %
Date Time Interrogation Session: 20220310012303
HighPow Impedance: 39 Ohm
HighPow Impedance: 48 Ohm
Implantable Lead Implant Date: 20081114
Implantable Lead Location: 753860
Implantable Lead Model: 6947
Implantable Pulse Generator Implant Date: 20170222
Lead Channel Impedance Value: 323 Ohm
Lead Channel Impedance Value: 399 Ohm
Lead Channel Pacing Threshold Amplitude: 0.5 V
Lead Channel Pacing Threshold Pulse Width: 0.4 ms
Lead Channel Sensing Intrinsic Amplitude: 6.375 mV
Lead Channel Sensing Intrinsic Amplitude: 6.375 mV
Lead Channel Setting Pacing Amplitude: 2.5 V
Lead Channel Setting Pacing Pulse Width: 0.4 ms
Lead Channel Setting Sensing Sensitivity: 0.3 mV

## 2020-06-19 ENCOUNTER — Other Ambulatory Visit: Payer: Self-pay | Admitting: Family Medicine

## 2020-06-20 ENCOUNTER — Ambulatory Visit: Payer: Medicare Other | Admitting: Family Medicine

## 2020-06-20 ENCOUNTER — Encounter: Payer: Self-pay | Admitting: Family Medicine

## 2020-06-20 ENCOUNTER — Other Ambulatory Visit: Payer: Self-pay

## 2020-06-20 ENCOUNTER — Ambulatory Visit (INDEPENDENT_AMBULATORY_CARE_PROVIDER_SITE_OTHER): Payer: Medicare Other | Admitting: Family Medicine

## 2020-06-20 VITALS — BP 108/64 | HR 70 | Ht 62.0 in | Wt 292.8 lb

## 2020-06-20 DIAGNOSIS — R059 Cough, unspecified: Secondary | ICD-10-CM

## 2020-06-20 DIAGNOSIS — E1122 Type 2 diabetes mellitus with diabetic chronic kidney disease: Secondary | ICD-10-CM | POA: Diagnosis not present

## 2020-06-20 DIAGNOSIS — N184 Chronic kidney disease, stage 4 (severe): Secondary | ICD-10-CM | POA: Diagnosis not present

## 2020-06-20 NOTE — Patient Instructions (Signed)
I have sent in a referral for you to the pulmonologist (lung doctor) and they should call you in the next few weeks. I would like to see you back sometime in May

## 2020-06-21 NOTE — Assessment & Plan Note (Signed)
A1c today with excellent control.  Will follow up in 3 to 4 months for diabetes mellitus.

## 2020-06-21 NOTE — Progress Notes (Signed)
    CHIEF COMPLAINT / HPI: #1.  Chronic cough.  Many months.  Nonproductive.  It is especially bad in the morning but it bothers him throughout the day. 2.  Follow-up diabetes mellitus: Taking his medicine regularly without problem.  No episodes of low blood sugar.    PERTINENT  PMH / PSH: I have reviewed the patient's medications, allergies, past medical and surgical history, smoking status and updated in the EMR as appropriate.   OBJECTIVE:  BP 108/64   Pulse 70   Ht '5\' 2"'$  (1.575 m)   Wt 292 lb 12.8 oz (132.8 kg)   SpO2 97%   BMI 53.55 kg/m   Vital signs reviewed. GENERAL: Well-developed, well-nourished, no acute distress. CARDIOVASCULAR: Regular rate and rhythm no murmur gallop or rub LUNGS: Clear to auscultation bilaterally, no rales or wheeze. ABDOMEN: Soft positive bowel sounds NEURO: No gross focal neurological deficits. MSK: Movement of extremity x 4.   ASSESSMENT / PLAN:  Chronic cough: I suspect this is related to postnasal drip.  He has significant cardiovascular history.  Will refer to pulmonology for further evaluation. No problem-specific Assessment & Plan notes found for this encounter.   [name] MD

## 2020-06-22 NOTE — Progress Notes (Signed)
Remote ICD transmission.   

## 2020-06-25 ENCOUNTER — Ambulatory Visit (INDEPENDENT_AMBULATORY_CARE_PROVIDER_SITE_OTHER): Payer: Medicare Other

## 2020-06-25 DIAGNOSIS — I5022 Chronic systolic (congestive) heart failure: Secondary | ICD-10-CM

## 2020-06-25 DIAGNOSIS — Z95810 Presence of automatic (implantable) cardiac defibrillator: Secondary | ICD-10-CM | POA: Diagnosis not present

## 2020-06-25 NOTE — Progress Notes (Signed)
EPIC Encounter for ICM Monitoring  Patient Name: [name] is a 60 y.o. male Date: 06/25/2020 Primary Care Physican: Dickie La, MD Primary [place] Electrophysiologist: Caryl Comes 3/21/2022Weight:289lbs  Spoke with patient and reports patients only complaint is ongoing cough for the last 3 months.  Pt took PRN Furosemide yesterday and today due to his consistent cough.  Visited PCP last week and has been referred to lung physician.  Patient has been to [name] in the last 3 weeks.   Optivol thoracic impedancesuggestingpossible fluid accumulation since 01/30/2020 and fluid index above threshold starting 02/08/2020.  Prescribed:Furosemide 40 mgTake 40 mg by mouth daily as needed for fluid.  Labs: 05/28/2020 Creatinine 1.52, BUN 9,   Potassium 4.1, Sodium 141, GFR 49-56 10/13/2021Creatinine 2.14, BUN18, Potassium4.5, Sodium138, [id number]  12/28/2019 Creatinine2.03, BUN21, Potassium4.8, Sodium133, [id number]  Acomplete set of results can be found in Results Review.  Recommendations:Pt taking PRN Furosemide as necessary for symptoms.  Follow-up plan: ICM clinic phone appointment on4/25/2022.91 day device clinic remote transmission6/12/2020.   EP/Cardiology Office Visits: Recall 05/28/2021 withRenee Charlcie Cradle, PA or Dr Rolinda Roan ICM check sent to [place].  3 month ICM trend: 06/25/2020.    1 Year ICM trend:       Rosalene Billings, RN 06/25/2020 10:19 AM

## 2020-06-27 ENCOUNTER — Other Ambulatory Visit: Payer: Self-pay

## 2020-06-27 ENCOUNTER — Telehealth: Payer: Self-pay | Admitting: Family Medicine

## 2020-06-27 DIAGNOSIS — G8929 Other chronic pain: Secondary | ICD-10-CM

## 2020-06-27 DIAGNOSIS — M25569 Pain in unspecified knee: Secondary | ICD-10-CM

## 2020-06-27 DIAGNOSIS — M170 Bilateral primary osteoarthritis of knee: Secondary | ICD-10-CM

## 2020-06-27 NOTE — Telephone Encounter (Signed)
Patient's wife is calling and would like to have his hydrocodone refilled.

## 2020-06-28 MED ORDER — HYDROCODONE-ACETAMINOPHEN 5-325 MG PO TABS: ORAL_TABLET | ORAL | 0 refills | Status: DC

## 2020-07-16 ENCOUNTER — Ambulatory Visit (INDEPENDENT_AMBULATORY_CARE_PROVIDER_SITE_OTHER): Payer: Medicare Other | Admitting: Podiatry

## 2020-07-16 ENCOUNTER — Other Ambulatory Visit: Payer: Self-pay

## 2020-07-16 DIAGNOSIS — M7741 Metatarsalgia, right foot: Secondary | ICD-10-CM

## 2020-07-16 DIAGNOSIS — L84 Corns and callosities: Secondary | ICD-10-CM

## 2020-07-16 DIAGNOSIS — M216X2 Other acquired deformities of left foot: Secondary | ICD-10-CM | POA: Diagnosis not present

## 2020-07-16 DIAGNOSIS — M216X1 Other acquired deformities of right foot: Secondary | ICD-10-CM

## 2020-07-16 DIAGNOSIS — M7742 Metatarsalgia, left foot: Secondary | ICD-10-CM

## 2020-07-16 DIAGNOSIS — E1165 Type 2 diabetes mellitus with hyperglycemia: Secondary | ICD-10-CM

## 2020-07-16 DIAGNOSIS — E1142 Type 2 diabetes mellitus with diabetic polyneuropathy: Secondary | ICD-10-CM

## 2020-07-16 NOTE — Progress Notes (Signed)
The patient presented to the office to day to pick up diabetic shoes and 3 pair diabetic custom inserts.  1 pair of inserts were put in the shoes and the shoes were fitted to the patient. The patient states they are comfortable and free of defect. He was satisfied with the fit of the shoe. Instructions for break in and wear were dispensed. The patient signed the delivery documentation and break in instruction form.  If any concerns or questions arise, he is instructed to call 

## 2020-07-23 DIAGNOSIS — I509 Heart failure, unspecified: Secondary | ICD-10-CM | POA: Diagnosis not present

## 2020-07-23 DIAGNOSIS — I10 Essential (primary) hypertension: Secondary | ICD-10-CM | POA: Diagnosis not present

## 2020-07-23 DIAGNOSIS — I472 Ventricular tachycardia: Secondary | ICD-10-CM | POA: Diagnosis not present

## 2020-07-23 DIAGNOSIS — E119 Type 2 diabetes mellitus without complications: Secondary | ICD-10-CM | POA: Diagnosis not present

## 2020-07-23 DIAGNOSIS — R0789 Other chest pain: Secondary | ICD-10-CM | POA: Diagnosis not present

## 2020-07-23 DIAGNOSIS — I420 Dilated cardiomyopathy: Secondary | ICD-10-CM | POA: Diagnosis not present

## 2020-07-23 DIAGNOSIS — N189 Chronic kidney disease, unspecified: Secondary | ICD-10-CM | POA: Diagnosis not present

## 2020-07-23 DIAGNOSIS — E785 Hyperlipidemia, unspecified: Secondary | ICD-10-CM | POA: Diagnosis not present

## 2020-07-27 ENCOUNTER — Other Ambulatory Visit: Payer: Self-pay | Admitting: Family Medicine

## 2020-07-27 ENCOUNTER — Telehealth: Payer: Self-pay | Admitting: Family Medicine

## 2020-07-27 DIAGNOSIS — G8929 Other chronic pain: Secondary | ICD-10-CM

## 2020-07-27 DIAGNOSIS — S39012A Strain of muscle, fascia and tendon of lower back, initial encounter: Secondary | ICD-10-CM

## 2020-07-27 DIAGNOSIS — M170 Bilateral primary osteoarthritis of knee: Secondary | ICD-10-CM

## 2020-07-27 NOTE — Telephone Encounter (Signed)
Patients wife is calling and would like to have his hydrocodone refilled.

## 2020-07-30 ENCOUNTER — Ambulatory Visit (INDEPENDENT_AMBULATORY_CARE_PROVIDER_SITE_OTHER): Payer: Medicare Other

## 2020-07-30 DIAGNOSIS — I5022 Chronic systolic (congestive) heart failure: Secondary | ICD-10-CM | POA: Diagnosis not present

## 2020-07-30 DIAGNOSIS — Z95810 Presence of automatic (implantable) cardiac defibrillator: Secondary | ICD-10-CM | POA: Diagnosis not present

## 2020-07-30 MED ORDER — HYDROCODONE-ACETAMINOPHEN 5-325 MG PO TABS: ORAL_TABLET | ORAL | 0 refills | Status: DC

## 2020-07-30 NOTE — Addendum Note (Signed)
Addended byDorcas Mcmurray L on: 07/30/2020 04:03 PM   Modules accepted: Orders

## 2020-07-30 NOTE — Telephone Encounter (Signed)
Wife informed. Ottis Stain, CMA

## 2020-07-30 NOTE — Telephone Encounter (Signed)
It is refilled THANKS! [name]

## 2020-07-30 NOTE — Telephone Encounter (Signed)
Pts wife calling to check status.  She would like a call when able to pick up. Christen Bame, CMA

## 2020-07-31 ENCOUNTER — Other Ambulatory Visit: Payer: Self-pay | Admitting: Family Medicine

## 2020-08-01 NOTE — Progress Notes (Signed)
EPIC Encounter for ICM Monitoring  Patient Name: [name] is a 60 y.o. male Date: 08/01/2020 Primary Care Physican: Dickie La, MD Primary [place] Electrophysiologist: Caryl Comes 3/21/2022Weight:289lbs  Spoke with patient and reports feeling well at this time.  Denies fluid symptoms.   He has appointment with lung doctor in May  Optivol thoracic impedancesuggestingpossible ongoing fluid accumulation since 10/25/2021and fluid index above threshold starting 02/08/2020.  Prescribed:Furosemide 40 mgTake 40 mg by mouth daily as needed for fluid.  Labs: 05/28/2020 Creatinine 1.52, BUN 9, Potassium 4.1, Sodium 141, GFR 49-56 10/13/2021Creatinine 2.14, BUN18, Potassium4.5, Sodium138, [id number]  12/28/2019 Creatinine2.03, BUN21, Potassium4.8, Sodium133, [id number]  Acomplete set of results can be found in Results Review.  Recommendations:No changes and encouraged to call if experiencing any fluid symptoms.  Follow-up plan: ICM clinic phone appointment on6/09/2020.91 day device clinic remote transmission6/12/2020.   EP/Cardiology Office Visits: Recall 2/21/2023withRenee Earnie Larsson Dr Rolinda Roan ICM check sent to [place].  3 month ICM trend: 07/30/2020.    1 Year ICM trend:       Rosalene Billings, RN 08/01/2020 9:07 AM

## 2020-08-08 ENCOUNTER — Institutional Professional Consult (permissible substitution): Payer: Medicare Other | Admitting: Pulmonary Disease

## 2020-08-11 ENCOUNTER — Telehealth: Payer: Self-pay | Admitting: Family Medicine

## 2020-08-11 NOTE — Telephone Encounter (Signed)
After-hours call Patient's wife because the after-hours line out of concern for her husband's worsening shortness of breath and wheezing.  She reports that the wheezing has been getting worse over the last few days.  She does not feel like he is having a heart failure exacerbation but is unsure.  She wants to know if she can give him some of her albuterol to help with his wheezing.  We discussed our options and the patient's wife is going to take the patient to be evaluated either in urgent care or emergency room to determine the cause of this wheezing and shortness of breath.  Patient denies any chest pain.  Reports considerable amount of coughing and pain in his low back Patient may not need to be admitted but it is difficult to evaluate the patient over the phone to determine severity of symptoms..  Patient's wife will take the patient to be physically evaluated at this time.  No further questions or concerns.

## 2020-08-14 ENCOUNTER — Other Ambulatory Visit: Payer: Self-pay | Admitting: Family Medicine

## 2020-08-15 ENCOUNTER — Other Ambulatory Visit: Payer: Self-pay | Admitting: Family Medicine

## 2020-08-15 ENCOUNTER — Encounter: Payer: Self-pay | Admitting: Pulmonary Disease

## 2020-08-15 ENCOUNTER — Ambulatory Visit (INDEPENDENT_AMBULATORY_CARE_PROVIDER_SITE_OTHER): Payer: Medicare Other | Admitting: Pulmonary Disease

## 2020-08-15 ENCOUNTER — Ambulatory Visit (INDEPENDENT_AMBULATORY_CARE_PROVIDER_SITE_OTHER): Payer: Medicare Other

## 2020-08-15 ENCOUNTER — Other Ambulatory Visit: Payer: Self-pay

## 2020-08-15 VITALS — BP 130/80 | HR 91 | Temp 98.2°F | Ht 62.0 in | Wt 306.4 lb

## 2020-08-15 DIAGNOSIS — R059 Cough, unspecified: Secondary | ICD-10-CM

## 2020-08-15 DIAGNOSIS — R0982 Postnasal drip: Secondary | ICD-10-CM

## 2020-08-15 MED ORDER — IPRATROPIUM BROMIDE 0.03 % NA SOLN: 2.0000 | Freq: Two times a day (BID) | NASAL | 12 refills | Status: DC

## 2020-08-15 MED ORDER — CETIRIZINE HCL 10 MG PO TABS
10.0000 mg | ORAL_TABLET | Freq: Every day | ORAL | 6 refills | Status: DC
Start: 2020-08-15 — End: 2022-09-03

## 2020-08-15 MED ORDER — FLOVENT HFA 110 MCG/ACT IN AERO: 2.0000 | INHALATION_SPRAY | Freq: Two times a day (BID) | RESPIRATORY_TRACT | 12 refills | Status: DC

## 2020-08-15 MED ORDER — FLUTICASONE PROPIONATE 50 MCG/ACT NA SUSP: 2.0000 | Freq: Every day | NASAL | 2 refills | Status: DC

## 2020-08-15 NOTE — Progress Notes (Signed)
Synopsis: Referred in May 2022 for cough  Subjective:   PATIENT ID: [name] GENDER: male DOB: [birth year], MRN: [id number]   HPI  Chief Complaint  Patient presents with  . Consult    Referred by PCP for cough for the past 2-3 months. Described as a non-productive cough. Wheezing that started around 3-4 days ago. Increased SOB associated with cough.    [name] is a 60 year old male, never smoker with obstructive sleep apnea, hypertension, obesity, and congestive heart failure who is referred to pulmonary clinic for chronic cough.  He reports having a cough since the beginning of the year. The cough is dry for the most part but he reports feeling chest congestion that he can cough up to his throat but then it becomes stuck there. He denies trouble with swallowing. He reports intermittent wheezing and shortness of breath with the cough. He also reports bilateral lower back pain due to the cough. He reports being treated for pneumonia in 04/2020 and denies history of frequent pneumonias. He felt like the cough was easing up over the last couple of weeks but has returned. He complains of sinus congestion, pressure and drainage which is an ongoing issue and not related to seasons. He denies issues with reflux.   He has history of sleep apnea but is not on a CPAP machine as he is unable to tolerate its use.   He is a never smoker. His significant other who accompanied him today smokes in the house along with her son. They have started to smoke outside starting a couple days ago.   Past Medical History:  Diagnosis Date  . Arthritis   . Arthritis of knee, degenerative 10/03/2011   Right > LEFT KNEE oa X-rays were not standing but showed some mild sclerosis and joint space loss of the medial compartment of the right knee. History of arthroscopy right knee. Report of injury as a child but unclear exactly what that was.  :I am aware of 12/01/18 Rx by Dr. Nori Riis.  While wife was  hospitalized, narcotic Rx went missing when in-home sitter left.  To my knowledge, this is the first mi  . Automatic implantable cardioverter-defibrillator in situ 07/15/2013  . Blind in both eyes 06/15/2013   Right Sees Dr Katy Fitch  . Cataracts, bilateral    right  . Chronic gout due to renal impairment of multiple sites without tophus 04/12/2020  . Chronic systolic heart failure ([place]) 02/18/2007   Qualifier: Diagnosis of  By: Unk Lightning MD, Tivis Ringer   . Congestive heart failure ([place])    a. s/p MDT single chamber ICD   . Diabetes mellitus    does not check blood sugars at home  . Diverticulosis   . Encounter for chronic pain management 05/17/2014   Indication for chronic opioid: severe arthritis RIGHTknee / low back Medication and dose: hydrocodone---uses prn # pills per month: gets #120 pills to last TWO months Last UDS date: 05/17/2014 Pain contract signed (Y/N): Y 05/17/2014 Date narcotic database last reviewed (include red flags): 05/17/2014   . Glaucoma    bilateral  . Glaucoma 11/07/2015   Severe See Dr. Katy Fitch  . H/O TIA (transient ischemic attack) and stroke   . Hyperlipidemia   . Hyperlipidemia with target low density lipoprotein (LDL) cholesterol less than 100 mg/dL 08/20/2011  . Hypertension   . HYPERTENSION, BENIGN 02/18/2007   Qualifier: Diagnosis of  By: Nori Riis MD, Clarise Cruz    . Metatarsalgia of both feet 04/12/2020  .  NICM (nonischemic cardiomyopathy) ([place]) 05/30/2015  . Obesity   . OBESITY, MORBID 05/21/2010   Qualifier: Diagnosis of  By: Caryl Comes, MD, Leonidas Romberg Mack Guise   . Obstructive sleep apnea 03/27/2009   Qualifier: Diagnosis of  By: Unk Lightning, RN, BSN, Melanie    . Paroxysmal atrial fibrillation ([place]) 09/21/2019  . Paroxysmal VT ([place]) 06/22/2019  . RENAL DISEASE, CHRONIC, STAGE III 02/18/2007  . SHOULDER PAIN, LEFT 08/01/2009   History of left shoulder surgery.    . Single implantable cardioverter-defibrillator (ICD) in situ 06/17/2011  . Type 2 diabetes mellitus with stage 4 chronic  kidney disease, without long-term current use of insulin ([place]) 08/25/2007     Family History  Problem Relation Age of Onset  . Colon cancer Father   . Esophageal cancer Neg Hx   . Rectal cancer Neg Hx   . Stomach cancer Neg Hx      Social History   Socioeconomic History  . Marital status: Legally Separated    Spouse name: Not on file  . Number of children: Not on file  . Years of education: Not on file  . Highest education level: Not on file  Occupational History  . Not on file  Tobacco Use  . Smoking status: Never Smoker  . Smokeless tobacco: Never Used  Vaping Use  . Vaping Use: Never used  Substance and Sexual Activity  . Alcohol use: No  . Drug use: No  . Sexual activity: Not on file  Other Topics Concern  . Not on file  Social History Narrative  . Not on file   Social Determinants of Health   Financial Resource Strain: Not on file  Food Insecurity: Not on file  Transportation Needs: Not on file  Physical Activity: Not on file  Stress: Not on file  Social Connections: Not on file  Intimate Partner Violence: Not on file     Allergies  Allergen Reactions  . Bee Pollen Anaphylaxis, Itching and Swelling  . Nsaids     CKD and CHF     Outpatient Medications Prior to Visit  Medication Sig Dispense Refill  . allopurinol (ZYLOPRIM) 100 MG tablet Take 0.5 tablets (50 mg total) by mouth daily. 45 tablet 3  . blood glucose meter kit and supplies Dispense based on patient and insurance preference. Use up to four times daily as directed. (FOR ICD-10 E10.9, E11.9). 1 each 0  . Blood Glucose Monitoring Suppl (ONETOUCH VERIO) w/Device KIT 1 kit by Does not apply route as directed. Use up to four times daily as directed. (FOR ICD-10 E10.9, E11.9). 1 kit 3  . brimonidine (ALPHAGAN) 0.2 % ophthalmic solution Place 1 drop into both eyes 2 (two) times daily.    . carvedilol (COREG) 25 MG tablet Take 1 tablet (25 mg total) by mouth 2 (two) times daily with a meal.    .  cyclobenzaprine (FLEXERIL) 5 MG tablet Take 1 tablet (5 mg total) by mouth at bedtime. 30 tablet 1  . diclofenac Sodium (VOLTAREN) 1 % GEL Apply 2 g topically 4 (four) times daily. 100 g 0  . dorzolamide-timolol (COSOPT) 22.3-6.8 MG/ML ophthalmic solution Place 1 drop into both eyes 2 (two) times daily.    Marland Kitchen ELIQUIS 5 MG TABS tablet Take 1 tablet (5 mg total) by mouth 2 (two) times daily. 180 tablet 0  . EPINEPHrine 0.3 mg/0.3 mL IJ SOAJ injection INJECT (0.18m) into musle AS NEEDED (Patient taking differently: Inject 0.3 mg into the muscle as needed for anaphylaxis.) 2 each  0  . furosemide (LASIX) 40 MG tablet Take 1 tablet (40 mg total) by mouth daily as needed for fluid. 90 tablet 0  . glimepiride (AMARYL) 4 MG tablet Take 4 mg by mouth daily.    Marland Kitchen glucose blood (ONETOUCH VERIO) test strip Use as instructed. Use up to four times daily as directed. (FOR ICD-10 E10.9, E11.9). 100 each 12  . HYDROcodone-acetaminophen (NORCO/VICODIN) 5-325 MG tablet Take 2 in AM one at mid day and 2 at bedtime prn knee pain 150 tablet 0  . insulin glargine (LANTUS SOLOSTAR) 100 UNIT/ML Solostar Pen Inject 25 units into skin once daily 27 mL 3  . isosorbide mononitrate (ISMO) 20 MG tablet TAKE ONE TABLET BY MOUTH TWICE DAILY 180 tablet 3  . latanoprost (XALATAN) 0.005 % ophthalmic solution Place 1 drop into both eyes every evening.    Marland Kitchen NARCAN 4 MG/0.1ML LIQD nasal spray kit     . nitroGLYCERIN (NITROSTAT) 0.4 MG SL tablet Place 0.4 mg under the tongue as needed.    Marland Kitchen NOVOLOG 100 UNIT/ML injection SMARTSIG:5 Unit(s) SUB-Q 3 Times Daily    . rosuvastatin (CRESTOR) 10 MG tablet Take 1 tablet (10 mg total) by mouth daily. Replaces lipitor 90 tablet 3  . sacubitril-valsartan (ENTRESTO) 49-51 MG Take 1 tablet by mouth 2 (two) times daily.    Marland Kitchen spironolactone (ALDACTONE) 25 MG tablet Take 25 mg by mouth 2 (two) times daily.     . SURE COMFORT PEN NEEDLES 32G X 6 MM MISC 1 Units by Does not apply route in the morning, at  noon, in the evening, and at bedtime. 100 each 1  . TRUEPLUS INSULIN SYRINGE 31G X 5/16" 0.3 ML MISC      No facility-administered medications prior to visit.    Review of Systems  Constitutional: Negative for chills, fever, malaise/fatigue and weight loss.  HENT: Positive for congestion and sinus pain. Negative for sore throat.   Eyes: Negative.   Respiratory: Positive for cough, sputum production, shortness of breath and wheezing. Negative for hemoptysis.   Cardiovascular: Negative for chest pain, palpitations, orthopnea, claudication and leg swelling.  Gastrointestinal: Negative for abdominal pain, heartburn, nausea and vomiting.  Genitourinary: Negative.   Musculoskeletal: Negative for joint pain and myalgias.  Skin: Negative for rash.  Neurological: Negative for weakness.  Endo/Heme/Allergies: Negative.   Psychiatric/Behavioral: Negative.     Objective:   Vitals:   08/15/20 1038  BP: 130/80  Pulse: 91  Temp: 98.2 F (36.8 C)  TempSrc: Temporal  SpO2: 98%  Weight: (!) 306 lb 6.4 oz (139 kg)  Height: 5' 2"  (1.575 m)     Physical Exam Constitutional:      General: He is not in acute distress.    Appearance: He is obese.  HENT:     Head: Normocephalic and atraumatic.     Nose: Congestion present.     Mouth/Throat:     Mouth: Mucous membranes are moist.     Pharynx: Posterior oropharyngeal erythema present.  Eyes:     Extraocular Movements: Extraocular movements intact.     Conjunctiva/sclera: Conjunctivae normal.     Pupils: Pupils are equal, round, and reactive to light.  Cardiovascular:     Rate and Rhythm: Normal rate and regular rhythm.     Pulses: Normal pulses.     Heart sounds: Normal heart sounds. No murmur heard.   Pulmonary:     Effort: Pulmonary effort is normal.     Breath sounds: Normal breath sounds. No wheezing,  rhonchi or rales.  Abdominal:     General: Bowel sounds are normal.     Palpations: Abdomen is soft.  Musculoskeletal:     Right  lower leg: No edema.     Left lower leg: No edema.  Lymphadenopathy:     Cervical: No cervical adenopathy.  Skin:    General: Skin is warm and dry.  Neurological:     General: No focal deficit present.     Mental Status: He is alert.  Psychiatric:        Mood and Affect: Mood normal.        Behavior: Behavior normal.        Thought Content: Thought content normal.        Judgment: Judgment normal.     CBC    Component Value Date/Time   WBC 10.5 09/21/2019 1140   WBC 16.7 (H) 09/15/2019 0840   RBC 4.31 09/21/2019 1140   RBC 4.44 09/15/2019 0840   HGB 12.6 (L) 09/21/2019 1140   HCT 37.6 09/21/2019 1140   PLT 264 09/21/2019 1140   MCV 87 09/21/2019 1140   MCH 29.2 09/21/2019 1140   MCH 28.8 09/15/2019 0840   MCHC 33.5 09/21/2019 1140   MCHC 33.5 09/15/2019 0840   RDW 13.8 09/21/2019 1140   LYMPHSABS 2.6 05/24/2015 0824   MONOABS 0.6 05/24/2015 0824   EOSABS 0.4 05/24/2015 0824   BASOSABS 0.0 05/24/2015 0824   BMP Latest Ref Rng & Units 05/28/2020 01/18/2020 12/28/2019  Glucose 65 - 99 mg/dL 132(H) 511(HH) 640(HH)  BUN 8 - 27 mg/dL 9 18 21   Creatinine 0.76 - 1.27 mg/dL 1.52(H) 2.14(H) 2.03(H)  BUN/Creat Ratio 10 - 24 6(L) 8(L) 10  Sodium 134 - 144 mmol/L 141 138 133(L)  Potassium 3.5 - 5.2 mmol/L 4.1 4.5 4.8  Chloride 96 - 106 mmol/L 102 101 96  CO2 20 - 29 mmol/L 25 21 19(L)  Calcium 8.6 - 10.2 mg/dL 8.7 8.8 9.4   Chest imaging: CXR 08/15/20 No pleural effusions or opacities noted. Concern for scattered bronchial cuffing bilaterally.  PFT: No flowsheet data found.  Echo 09/12/19: EF 55-60%. Mild LV hypertrophy, normal function. RV systolic function is normal. The RV size is normal.   Sleep Study 09/14/2017 AHI 21.8/hr, desaturations to 68% Titrated to PAP of 13cm H2O     Assessment & Plan:   Cough - Plan: DG Chest 2 View, fluticasone (FLONASE) 50 MCG/ACT nasal spray, ipratropium (ATROVENT) 0.03 % nasal spray, cetirizine (ZYRTEC ALLERGY) 10 MG  tablet  Post-nasal drainage  Discussion: [name] is a 60 year old male, never smoker with obstructive sleep apnea, hypertension, obesity, and congestive heart failure who is referred to pulmonary clinic for chronic cough.  His cough appears related to post-nasal drainage. He is to start fluticasone nasal spray 2 sprays per nostril daily and ipratropium nasal spray, 2 sprays per nostril twice daily. I discussed that the fluticasone could contribute to worsening of his glaucoma and he expressed understanding if any of his eye symptoms worsen to stop the fluticasone nasal spray.   He is to start cetirizine 70m daily for allergies.   Instructed the patient's wife to continue smoking outside the home.   Chest radiograph today is fairly clear except for scattered bronchial cuffing concerning for bronchitis. We will also start him on flovent inhaler 1167m 2 puffs daily. I have called the patient and his wife.  Follow up in 6 weeks.  JoFreda JacksonMD [facility] Office:  [phone]    Current Outpatient Medications:  .  allopurinol (ZYLOPRIM) 100 MG tablet, Take 0.5 tablets (50 mg total) by mouth daily., Disp: 45 tablet, Rfl: 3 .  blood glucose meter kit and supplies, Dispense based on patient and insurance preference. Use up to four times daily as directed. (FOR ICD-10 E10.9, E11.9)., Disp: 1 each, Rfl: 0 .  Blood Glucose Monitoring Suppl (ONETOUCH VERIO) w/Device KIT, 1 kit by Does not apply route as directed. Use up to four times daily as directed. (FOR ICD-10 E10.9, E11.9)., Disp: 1 kit, Rfl: 3 .  brimonidine (ALPHAGAN) 0.2 % ophthalmic solution, Place 1 drop into both eyes 2 (two) times daily., Disp: , Rfl:  .  carvedilol (COREG) 25 MG tablet, Take 1 tablet (25 mg total) by mouth 2 (two) times daily with a meal., Disp: , Rfl:  .  cetirizine (ZYRTEC ALLERGY) 10 MG tablet, Take 1 tablet (10 mg total) by mouth daily., Disp: 30 tablet, Rfl: 6 .   cyclobenzaprine (FLEXERIL) 5 MG tablet, Take 1 tablet (5 mg total) by mouth at bedtime., Disp: 30 tablet, Rfl: 1 .  diclofenac Sodium (VOLTAREN) 1 % GEL, Apply 2 g topically 4 (four) times daily., Disp: 100 g, Rfl: 0 .  dorzolamide-timolol (COSOPT) 22.3-6.8 MG/ML ophthalmic solution, Place 1 drop into both eyes 2 (two) times daily., Disp: , Rfl:  .  ELIQUIS 5 MG TABS tablet, Take 1 tablet (5 mg total) by mouth 2 (two) times daily., Disp: 180 tablet, Rfl: 0 .  EPINEPHrine 0.3 mg/0.3 mL IJ SOAJ injection, INJECT (0.69m) into musle AS NEEDED (Patient taking differently: Inject 0.3 mg into the muscle as needed for anaphylaxis.), Disp: 2 each, Rfl: 0 .  fluticasone (FLONASE) 50 MCG/ACT nasal spray, Place 2 sprays into both nostrils daily., Disp: 16 g, Rfl: 2 .  furosemide (LASIX) 40 MG tablet, Take 1 tablet (40 mg total) by mouth daily as needed for fluid., Disp: 90 tablet, Rfl: 0 .  glimepiride (AMARYL) 4 MG tablet, Take 4 mg by mouth daily., Disp: , Rfl:  .  glucose blood (ONETOUCH VERIO) test strip, Use as instructed. Use up to four times daily as directed. (FOR ICD-10 E10.9, E11.9)., Disp: 100 each, Rfl: 12 .  HYDROcodone-acetaminophen (NORCO/VICODIN) 5-325 MG tablet, Take 2 in AM one at mid day and 2 at bedtime prn knee pain, Disp: 150 tablet, Rfl: 0 .  insulin glargine (LANTUS SOLOSTAR) 100 UNIT/ML Solostar Pen, Inject 25 units into skin once daily, Disp: 27 mL, Rfl: 3 .  ipratropium (ATROVENT) 0.03 % nasal spray, Place 2 sprays into both nostrils every 12 (twelve) hours., Disp: 30 mL, Rfl: 12 .  isosorbide mononitrate (ISMO) 20 MG tablet, TAKE ONE TABLET BY MOUTH TWICE DAILY, Disp: 180 tablet, Rfl: 3 .  latanoprost (XALATAN) 0.005 % ophthalmic solution, Place 1 drop into both eyes every evening., Disp: , Rfl:  .  NARCAN 4 MG/0.1ML LIQD nasal spray kit, , Disp: , Rfl:  .  nitroGLYCERIN (NITROSTAT) 0.4 MG SL tablet, Place 0.4 mg under the tongue as needed., Disp: , Rfl:  .  NOVOLOG 100 UNIT/ML  injection, SMARTSIG:5 Unit(s) SUB-Q 3 Times Daily, Disp: , Rfl:  .  rosuvastatin (CRESTOR) 10 MG tablet, Take 1 tablet (10 mg total) by mouth daily. Replaces lipitor, Disp: 90 tablet, Rfl: 3 .  sacubitril-valsartan (ENTRESTO) 49-51 MG, Take 1 tablet by mouth 2 (two) times daily., Disp: , Rfl:  .  spironolactone (ALDACTONE) 25 MG tablet, Take 25 mg by mouth 2 (two) times  daily. , Disp: , Rfl:  .  SURE COMFORT PEN NEEDLES 32G X 6 MM MISC, 1 Units by Does not apply route in the morning, at noon, in the evening, and at bedtime., Disp: 100 each, Rfl: 1 .  TRUEPLUS INSULIN SYRINGE 31G X 5/16" 0.3 ML MISC, , Disp: , Rfl:

## 2020-08-15 NOTE — Patient Instructions (Signed)
We will get a chest x-ray today  We will start you on fluticasone nasal spray, 2 sprays per nostril once daily  We will start you on ipratropium nasal spray, 2 sprays per nostril twice daily  Start Cetirizine '10mg'$  daily for allergies

## 2020-08-20 ENCOUNTER — Ambulatory Visit: Payer: Medicare Other | Admitting: Podiatry

## 2020-08-22 ENCOUNTER — Ambulatory Visit (INDEPENDENT_AMBULATORY_CARE_PROVIDER_SITE_OTHER): Payer: Medicare Other | Admitting: Family Medicine

## 2020-08-22 ENCOUNTER — Encounter: Payer: Self-pay | Admitting: Family Medicine

## 2020-08-22 ENCOUNTER — Other Ambulatory Visit: Payer: Self-pay

## 2020-08-22 VITALS — BP 118/76 | HR 67 | Ht 62.0 in | Wt 316.8 lb

## 2020-08-22 DIAGNOSIS — I10 Essential (primary) hypertension: Secondary | ICD-10-CM

## 2020-08-22 DIAGNOSIS — I428 Other cardiomyopathies: Secondary | ICD-10-CM | POA: Diagnosis not present

## 2020-08-22 DIAGNOSIS — E1122 Type 2 diabetes mellitus with diabetic chronic kidney disease: Secondary | ICD-10-CM

## 2020-08-22 DIAGNOSIS — Z7901 Long term (current) use of anticoagulants: Secondary | ICD-10-CM | POA: Diagnosis not present

## 2020-08-22 DIAGNOSIS — M170 Bilateral primary osteoarthritis of knee: Secondary | ICD-10-CM

## 2020-08-22 DIAGNOSIS — N184 Chronic kidney disease, stage 4 (severe): Secondary | ICD-10-CM | POA: Diagnosis not present

## 2020-08-22 DIAGNOSIS — G8929 Other chronic pain: Secondary | ICD-10-CM

## 2020-08-22 DIAGNOSIS — M25569 Pain in unspecified knee: Secondary | ICD-10-CM | POA: Diagnosis not present

## 2020-08-22 LAB — POCT GLYCOSYLATED HEMOGLOBIN (HGB A1C): HbA1c, POC (controlled diabetic range): 7.8 % — AB (ref 0.0–7.0)

## 2020-08-22 MED ORDER — HYDROCODONE-ACETAMINOPHEN 5-325 MG PO TABS: ORAL_TABLET | ORAL | 0 refills | Status: DC

## 2020-08-22 NOTE — Patient Instructions (Signed)
I will plan on seeing you in three months. Nice work on your diabetes.

## 2020-08-23 ENCOUNTER — Encounter: Payer: Self-pay | Admitting: Family Medicine

## 2020-08-23 DIAGNOSIS — Z7901 Long term (current) use of anticoagulants: Secondary | ICD-10-CM | POA: Insufficient documentation

## 2020-08-23 LAB — COMPREHENSIVE METABOLIC PANEL
ALT: 13 IU/L (ref 0–44)
AST: 12 IU/L (ref 0–40)
Albumin/Globulin Ratio: 1.6 (ref 1.2–2.2)
Albumin: 4.4 g/dL (ref 3.8–4.8)
Alkaline Phosphatase: 76 IU/L (ref 44–121)
BUN/Creatinine Ratio: 12 (ref 10–24)
BUN: 19 mg/dL (ref 8–27)
Bilirubin Total: 0.4 mg/dL (ref 0.0–1.2)
CO2: 21 mmol/L (ref 20–29)
Calcium: 9.1 mg/dL (ref 8.6–10.2)
Chloride: 101 mmol/L (ref 96–106)
Creatinine, Ser: 1.53 mg/dL — ABNORMAL HIGH (ref 0.76–1.27)
Globulin, Total: 2.8 g/dL (ref 1.5–4.5)
Glucose: 90 mg/dL (ref 65–99)
Potassium: 5.1 mmol/L (ref 3.5–5.2)
Sodium: 144 mmol/L (ref 134–144)
Total Protein: 7.2 g/dL (ref 6.0–8.5)
eGFR: 51 mL/min/{1.73_m2} — ABNORMAL LOW (ref >59)

## 2020-08-23 NOTE — Assessment & Plan Note (Signed)
Checked A!C today and is in much better range Will continue current medication regimen. labs

## 2020-08-23 NOTE — Assessment & Plan Note (Signed)
Followed in conjunction with cardiology Stressed weight management today

## 2020-08-23 NOTE — Assessment & Plan Note (Signed)
Will continue current medication regimen.

## 2020-08-23 NOTE — Assessment & Plan Note (Signed)
Continue DOAC

## 2020-08-23 NOTE — Assessment & Plan Note (Signed)
Good control Euvolemic today check labs today Continue current meds

## 2020-08-23 NOTE — Assessment & Plan Note (Signed)
Encouraged him to exercise as much as he can (walking)

## 2020-08-23 NOTE — Progress Notes (Signed)
    CHIEF COMPLAINT / HPI: F/u DM. Taking meds with no problems F/u HTN with NICM and chronic atrial fibrillation, on DOAC No problems wih meds Has been exercising a little Not noticing any LE edema Pain meds working for his chronic knee pain, needs refill Saw pulmonology doctor and rx fluticasone for nose and a new inhaler---feeling better---wonders if he should continue the meds   PERTINENT  PMH / PSH: I have reviewed the patient's medications, allergies, past medical and surgical history, smoking status and updated in the EMR as appropriate. I have reviewed the most recent office and hospital notes from specialists pulmonology   OBJECTIVE:  BP 118/76   Pulse 67   Ht '5\' 2"'$  (1.575 m)   Wt (!) 316 lb 12.8 oz (143.7 kg)   SpO2 95%   BMI 57.94 kg/m  Vital signs reviewed. GENERAL: Well-developed, well-nourished, no acute distress. Central obesity CARDIOVASCULAR: Regular rate and rhythm no murmur gallop or rub LUNGS: Clear to auscultation bilaterally, no rales or wheeze. ABDOMEN: Soft positive bowel sounds. No ascites noted.  NEURO: No gross new focal neurological deficits exceptHe has developed some transient/ intermittent  resting horizontal nystagmus.  he is legally blind and uses cane and assistance.  MSK: Movement of extremity x 4. Rises from a chair with minimal assistance from cane/chair arms No LE edema noted. PSYCH: AxOx4. .. No psychomotor retardation or agitation. Appropriate speech fluency and content. Asks and answers questions appropriately. Mood is congruent.  ASSESSMENT / PLAN:   HYPERTENSION, BENIGN Good control Euvolemic today check labs today Continue current meds  Type 2 diabetes mellitus with stage 4 chronic kidney disease, without long-term current use of insulin ([place]) Checked A!C today and is in much better range Will continue current medication regimen. labs  OBESITY, MORBID Encouraged him to exercise as much as he can (walking)  NICM  (nonischemic cardiomyopathy) ([place]) Followed in conjunction with cardiology Stressed weight management today  Arthritis of knee, degenerative Will continue current medication regimen.  Chronic anticoagulation Continue DOAC   [name] MD

## 2020-09-06 ENCOUNTER — Ambulatory Visit: Payer: Medicare Other | Admitting: Adult Health

## 2020-09-10 ENCOUNTER — Ambulatory Visit (INDEPENDENT_AMBULATORY_CARE_PROVIDER_SITE_OTHER): Payer: Medicare Other

## 2020-09-10 DIAGNOSIS — I5022 Chronic systolic (congestive) heart failure: Secondary | ICD-10-CM | POA: Diagnosis not present

## 2020-09-10 DIAGNOSIS — Z95810 Presence of automatic (implantable) cardiac defibrillator: Secondary | ICD-10-CM

## 2020-09-12 ENCOUNTER — Telehealth: Payer: Self-pay

## 2020-09-12 NOTE — Telephone Encounter (Signed)
Remote ICM transmission received.  Attempted call to patient regarding ICM remote transmission and left detailed message per DPR.  Advised to return call for any fluid symptoms or questions. Next ICM remote transmission scheduled 10/15/2020.

## 2020-09-12 NOTE — Progress Notes (Signed)
EPIC Encounter for ICM Monitoring  Patient Name: [name] is a 60 y.o. male Date: 09/12/2020 Primary Care Physican: Dickie La, MD Primary [place] Electrophysiologist: Caryl Comes 5/18/2022Weight:316lbs  Attempted call to patient and unable to reach.  Left detailed message per DPR regarding transmission. Transmission reviewed.   Optivol thoracic impedancesuggestingpossible ongoing fluid accumulation since 10/25/2021and fluid index above threshold starting 02/08/2020.  Prescribed:Furosemide 40 mgTake 40 mg by mouth daily as needed for fluid.  Labs: 08/22/2020 Creatinine 1.53, BUN 19, Potassium 5.1, Sodium 144 05/28/2020 Creatinine 1.52, BUN 9, Potassium 4.1, Sodium 141, GFR 49-56 Acomplete set of results can be found in Results Review.  Recommendations:Left voice mail with ICM number and encouraged to call if experiencing any fluid symptoms.  Follow-up plan: ICM clinic phone appointment on7/02/2021.91 day device clinic remote transmission9/11/2020.   EP/Cardiology Office Visits: Recall2/21/2023withRenee Charlcie Cradle, PAor Dr Rolinda Roan ICM check sent to [facility].  3 month ICM trend: 09/10/2020.    1 Year ICM trend:       Rosalene Billings, RN 09/12/2020 2:14 PM

## 2020-09-13 ENCOUNTER — Ambulatory Visit (INDEPENDENT_AMBULATORY_CARE_PROVIDER_SITE_OTHER): Payer: Medicare Other

## 2020-09-13 DIAGNOSIS — I428 Other cardiomyopathies: Secondary | ICD-10-CM | POA: Diagnosis not present

## 2020-09-13 LAB — CUP PACEART REMOTE DEVICE CHECK
Battery Remaining Longevity: 79 mo
Battery Voltage: 2.94 V
Brady Statistic RV Percent Paced: 0.34 %
Date Time Interrogation Session: 20220609001604
HighPow Impedance: 41 Ohm
HighPow Impedance: 49 Ohm
Implantable Lead Implant Date: 20081114
Implantable Lead Location: 753860
Implantable Lead Model: 6947
Implantable Pulse Generator Implant Date: 20170222
Lead Channel Impedance Value: 342 Ohm
Lead Channel Impedance Value: 399 Ohm
Lead Channel Pacing Threshold Amplitude: 0.625 V
Lead Channel Pacing Threshold Pulse Width: 0.4 ms
Lead Channel Sensing Intrinsic Amplitude: 7.375 mV
Lead Channel Sensing Intrinsic Amplitude: 7.375 mV
Lead Channel Setting Pacing Amplitude: 2.5 V
Lead Channel Setting Pacing Pulse Width: 0.4 ms
Lead Channel Setting Sensing Sensitivity: 0.3 mV

## 2020-09-14 NOTE — Progress Notes (Signed)
Spoke with patient.  He reports he has been feeling well but today had a little increase in SOB and took Furosemide as prescribed which normally resolves any fluid symptoms.  Advised if symptom worsens to call office if needed.  No changes today.

## 2020-09-21 ENCOUNTER — Other Ambulatory Visit: Payer: Self-pay

## 2020-09-21 ENCOUNTER — Telehealth: Payer: Self-pay

## 2020-09-21 DIAGNOSIS — G8929 Other chronic pain: Secondary | ICD-10-CM

## 2020-09-21 DIAGNOSIS — M170 Bilateral primary osteoarthritis of knee: Secondary | ICD-10-CM

## 2020-09-21 NOTE — Telephone Encounter (Signed)
[name] called in requested husband's pain medication refill sent in to pharmacy.

## 2020-09-25 MED ORDER — HYDROCODONE-ACETAMINOPHEN 5-325 MG PO TABS: ORAL_TABLET | ORAL | 0 refills | Status: DC

## 2020-09-25 NOTE — Telephone Encounter (Signed)
Called wife and informed her the medication has been sent to pharmacy. Ottis Stain, CMA

## 2020-09-25 NOTE — Telephone Encounter (Signed)
Patients wife is calling stating his meds are supposed to be picked up Thursday and she figured it would already have been sent in. I told her I would send a message back. Thanks!

## 2020-10-05 NOTE — Progress Notes (Signed)
Remote ICD transmission.   

## 2020-10-09 ENCOUNTER — Other Ambulatory Visit: Payer: Self-pay | Admitting: Family Medicine

## 2020-10-09 ENCOUNTER — Ambulatory Visit: Payer: Medicare Other | Admitting: Pulmonary Disease

## 2020-10-10 ENCOUNTER — Telehealth: Payer: Self-pay | Admitting: Family Medicine

## 2020-10-10 NOTE — Telephone Encounter (Signed)
Clinical info completed on SCAT form.  Place form in Dr. Neal's box for completion.  Cherina Dhillon Zimmerman Rumple, CMA  

## 2020-10-10 NOTE — Telephone Encounter (Signed)
Access [place] Eligibility  form dropped off for at front desk for completion.  Verified that patient section of form has been completed.  Last DOS/WCC with PCP was 08/22/20  Placed form in team folder to be completed by clinical staff.  [name]

## 2020-10-12 NOTE — Telephone Encounter (Signed)
SCAT form faxed to appropriate number and a copy was made for batch scanning. A copy was placed up front in case the patient would like a copy.

## 2020-10-15 ENCOUNTER — Ambulatory Visit (INDEPENDENT_AMBULATORY_CARE_PROVIDER_SITE_OTHER): Payer: Medicare Other

## 2020-10-15 ENCOUNTER — Other Ambulatory Visit: Payer: Self-pay | Admitting: Family Medicine

## 2020-10-15 DIAGNOSIS — I5022 Chronic systolic (congestive) heart failure: Secondary | ICD-10-CM

## 2020-10-15 DIAGNOSIS — Z95810 Presence of automatic (implantable) cardiac defibrillator: Secondary | ICD-10-CM | POA: Diagnosis not present

## 2020-10-17 NOTE — Progress Notes (Signed)
EPIC Encounter for ICM Monitoring  Patient Name: [name] is a 60 y.o. male Date: 10/17/2020 Primary Care Physican: Dickie La, MD Primary Cardiologist: Terrence Dupont  Electrophysiologist: Caryl Comes 10/17/2020 Weight: 230-240 lbs     Spoke with patient and heart failure questions reviewed.  Pt asymptomatic for fluid accumulation.   Optivol thoracic impedance suggesting fluid levels have returned to normal.   Prescribed: Furosemide 40 mg Take 1 tablet by mouth as directed.     Labs: 08/22/2020 Creatinine 1.53, BUN 19, Potassium 5.1, Sodium 144 05/28/2020 Creatinine 1.52, BUN 9,   Potassium 4.1, Sodium 141, GFR 49-56 A complete set of results can be found in Results Review.   Recommendations:  No changes and encouraged to call if experiencing any fluid symptoms.   Follow-up plan: ICM clinic phone appointment on 11/19/2020.   91 day device clinic remote transmission 12/13/2020.           EP/Cardiology Office Visits:  10/31/2020 with Oda Kilts, PA    Copy of ICM check sent to Dr. Caryl Comes.   3 month ICM trend: 10/15/2020.    1 Year ICM trend:       Rosalene Billings, RN 10/17/2020 3:40 PM

## 2020-10-22 DIAGNOSIS — R55 Syncope and collapse: Secondary | ICD-10-CM | POA: Diagnosis not present

## 2020-10-22 DIAGNOSIS — I509 Heart failure, unspecified: Secondary | ICD-10-CM | POA: Diagnosis not present

## 2020-10-22 DIAGNOSIS — I10 Essential (primary) hypertension: Secondary | ICD-10-CM | POA: Diagnosis not present

## 2020-10-22 DIAGNOSIS — I472 Ventricular tachycardia: Secondary | ICD-10-CM | POA: Diagnosis not present

## 2020-10-22 DIAGNOSIS — I420 Dilated cardiomyopathy: Secondary | ICD-10-CM | POA: Diagnosis not present

## 2020-10-22 DIAGNOSIS — N189 Chronic kidney disease, unspecified: Secondary | ICD-10-CM | POA: Diagnosis not present

## 2020-10-22 DIAGNOSIS — E119 Type 2 diabetes mellitus without complications: Secondary | ICD-10-CM | POA: Diagnosis not present

## 2020-10-22 DIAGNOSIS — H409 Unspecified glaucoma: Secondary | ICD-10-CM | POA: Diagnosis not present

## 2020-10-22 DIAGNOSIS — E785 Hyperlipidemia, unspecified: Secondary | ICD-10-CM | POA: Diagnosis not present

## 2020-10-23 ENCOUNTER — Other Ambulatory Visit: Payer: Self-pay | Admitting: Family Medicine

## 2020-10-23 DIAGNOSIS — G8929 Other chronic pain: Secondary | ICD-10-CM

## 2020-10-23 DIAGNOSIS — M170 Bilateral primary osteoarthritis of knee: Secondary | ICD-10-CM

## 2020-10-23 MED ORDER — HYDROCODONE-ACETAMINOPHEN 5-325 MG PO TABS
ORAL_TABLET | ORAL | 0 refills | Status: DC
Start: 2020-10-23 — End: 2020-11-19

## 2020-10-23 NOTE — Addendum Note (Signed)
Addended by: Londell Moh T on: 10/23/2020 09:36 AM   Modules accepted: Orders

## 2020-10-23 NOTE — Telephone Encounter (Signed)
Patients wife is calling and would like to have patients hydrocodone refilled.

## 2020-10-29 ENCOUNTER — Other Ambulatory Visit: Payer: Self-pay | Admitting: Family Medicine

## 2020-10-30 NOTE — Progress Notes (Deleted)
Electrophysiology Office Note Date: 10/30/2020  ID:  [name], DOB 1960/08/09, MRN [id number]  PCP: Dickie La, MD Primary Cardiologist: None Electrophysiologist: [name], MD   CC: Routine ICD follow-up  [name] is a 60 y.o. male seen today for [name], MD for acute visit due to elevated optivol on recent remote .  Since last being seen in our clinic the patient reports doing ***.  he denies chest pain, palpitations, dyspnea, PND, orthopnea, nausea, vomiting, dizziness, syncope, edema, weight gain, or early satiety. {He/she (caps):30048} has not had ICD shocks.   Device History: Medtronic Single Chamber ICD implanted 2008 for NICM, gen change 2017 History of appropriate therapy: No History of AAD therapy: No   Past Medical History:  Diagnosis Date   Arthritis    Arthritis of knee, degenerative 10/03/2011   Right > LEFT KNEE oa X-rays were not standing but showed some mild sclerosis and joint space loss of the medial compartment of the right knee. History of arthroscopy right knee. Report of injury as a child but unclear exactly what that was.  :I am aware of 12/01/18 Rx by Dr. Nori Riis.  While wife was hospitalized, narcotic Rx went missing when in-home sitter left.  To my knowledge, this is the first mi   Automatic implantable cardioverter-defibrillator in situ 07/15/2013   Blind in both eyes 06/15/2013   Right Sees Dr Katy Fitch   Cataracts, bilateral    right   Chronic gout due to renal impairment of multiple sites without tophus 07/08/5954   Chronic systolic heart failure ([place]) 02/18/2007   Qualifier: Diagnosis of  By: Unk Lightning MD, Tivis Ringer    Congestive heart failure ([place])    a. s/p MDT single chamber ICD    Diabetes mellitus    does not check blood sugars at home   Diverticulosis    Encounter for chronic pain management 05/17/2014   Indication for chronic opioid: severe arthritis RIGHTknee / low back Medication and dose: hydrocodone---uses prn # pills per  month: gets #120 pills to last TWO months Last UDS date: 05/17/2014 Pain contract signed (Y/N): Y 05/17/2014 Date narcotic database last reviewed (include red flags): 05/17/2014    Glaucoma    bilateral   Glaucoma 11/07/2015   Severe See Dr. Katy Fitch   H/O TIA (transient ischemic attack) and stroke    Hyperlipidemia    Hyperlipidemia with target low density lipoprotein (LDL) cholesterol less than 100 mg/dL 08/20/2011   Hypertension    HYPERTENSION, BENIGN 02/18/2007   Qualifier: Diagnosis of  By: Nori Riis MD, Drema Pry of both feet 04/12/2020   NICM (nonischemic cardiomyopathy) ([place]) 05/30/2015   Obesity    OBESITY, MORBID 05/21/2010   Qualifier: Diagnosis of  By: Caryl Comes, MD, Remus Blake    Obstructive sleep apnea 03/27/2009   Qualifier: Diagnosis of  By: Unk Lightning, RN, BSN, Melanie     Paroxysmal atrial fibrillation ([place]) 09/21/2019   Paroxysmal VT ([place]) 06/22/2019   RENAL DISEASE, CHRONIC, STAGE III 02/18/2007   SHOULDER PAIN, LEFT 08/01/2009   History of left shoulder surgery.     Single implantable cardioverter-defibrillator (ICD) in situ 06/17/2011   Type 2 diabetes mellitus with stage 4 chronic kidney disease, without long-term current use of insulin ([place]) 08/25/2007   Past Surgical History:  Procedure Laterality Date   CARDIAC CATHETERIZATION  10/24/2003   EF of 45-50%   CARDIAC DEFIBRILLATOR PLACEMENT  2008   CATARACT EXTRACTION  2012   right  EP IMPLANTABLE DEVICE N/A 05/30/2015   Procedure: ICD Generator Changeout;  Surgeon: [name], MD;  Location: [facility];  Service: Cardiovascular;  Laterality: N/A;   KNEE SURGERY Right 2000   SHOULDER SURGERY  2012   left    Current Outpatient Medications  Medication Sig Dispense Refill   allopurinol (ZYLOPRIM) 100 MG tablet Take 0.5 tablets (50 mg total) by mouth daily. 45 tablet 3   blood glucose meter kit and supplies Dispense based on patient and insurance preference. Use up to four times daily as directed.  (FOR ICD-10 E10.9, E11.9). 1 each 0   Blood Glucose Monitoring Suppl (ONETOUCH VERIO) w/Device KIT 1 kit by Does not apply route as directed. Use up to four times daily as directed. (FOR ICD-10 E10.9, E11.9). 1 kit 3   brimonidine (ALPHAGAN) 0.2 % ophthalmic solution Place 1 drop into both eyes 2 (two) times daily.     carvedilol (COREG) 25 MG tablet Take 1 tablet (25 mg total) by mouth 2 (two) times daily with a meal.     cetirizine (ZYRTEC ALLERGY) 10 MG tablet Take 1 tablet (10 mg total) by mouth daily. 30 tablet 6   cyclobenzaprine (FLEXERIL) 5 MG tablet Take 1 tablet (5 mg total) by mouth at bedtime. 30 tablet 1   diclofenac Sodium (VOLTAREN) 1 % GEL Apply 2 g topically 4 (four) times daily. 100 g 0   dorzolamide-timolol (COSOPT) 22.3-6.8 MG/ML ophthalmic solution Place 1 drop into both eyes 2 (two) times daily.     ELIQUIS 5 MG TABS tablet Take 1 tablet (5 mg total) by mouth 2 (two) times daily. 180 tablet 0   EPINEPHRINE 0.3 mg/0.3 mL IJ SOAJ injection Inject 0.3 mg into the muscle as needed for anaphylaxis. 2 each 1   fluticasone (FLONASE) 50 MCG/ACT nasal spray Place 2 sprays into both nostrils daily. 16 g 2   fluticasone (FLOVENT HFA) 110 MCG/ACT inhaler Inhale 2 puffs into the lungs 2 (two) times daily. 1 each 12   furosemide (LASIX) 40 MG tablet Take 1 tablet (40 mg total) by mouth as directed. 90 tablet 0   glimepiride (AMARYL) 4 MG tablet Take 4 mg by mouth daily.     glucose blood (ONETOUCH VERIO) test strip Use as instructed. Use up to four times daily as directed. (FOR ICD-10 E10.9, E11.9). 100 each 12   HYDROcodone-acetaminophen (NORCO/VICODIN) 5-325 MG tablet Take 2 in AM one at mid day and 2 at bedtime prn knee pain 150 tablet 0   insulin glargine (LANTUS SOLOSTAR) 100 UNIT/ML Solostar Pen Inject 25 units into skin once daily 27 mL 3   ipratropium (ATROVENT) 0.03 % nasal spray Place 2 sprays into both nostrils every 12 (twelve) hours. 30 mL 12   isosorbide mononitrate (ISMO) 20  MG tablet TAKE ONE TABLET BY MOUTH TWICE DAILY 180 tablet 3   latanoprost (XALATAN) 0.005 % ophthalmic solution Place 1 drop into both eyes every evening.     NARCAN 4 MG/0.1ML LIQD nasal spray kit      nitroGLYCERIN (NITROSTAT) 0.4 MG SL tablet Place 0.4 mg under the tongue as needed.     NOVOLOG 100 UNIT/ML injection SMARTSIG:5 Unit(s) SUB-Q 3 Times Daily     rosuvastatin (CRESTOR) 10 MG tablet Take 1 tablet (10 mg total) by mouth daily. Replaces lipitor 90 tablet 3   sacubitril-valsartan (ENTRESTO) 49-51 MG Take 1 tablet by mouth 2 (two) times daily.     spironolactone (ALDACTONE) 25 MG tablet Take 25  mg by mouth 2 (two) times daily.      SURE COMFORT PEN NEEDLES 32G X 6 MM MISC 1 Units by Does not apply route in the morning, at noon, in the evening, and at bedtime. 100 each 1   TRUEPLUS INSULIN SYRINGE 31G X 5/16" 0.3 ML MISC      No current facility-administered medications for this visit.    Allergies:   Bee pollen and Nsaids   Social History: Social History   Socioeconomic History   Marital status: Legally Separated    Spouse name: Not on file   Number of children: Not on file   Years of education: Not on file   Highest education level: Not on file  Occupational History   Not on file  Tobacco Use   Smoking status: Never   Smokeless tobacco: Never  Vaping Use   Vaping Use: Never used  Substance and Sexual Activity   Alcohol use: No   Drug use: No   Sexual activity: Not on file  Other Topics Concern   Not on file  Social History Narrative   Not on file   Social Determinants of Health   Financial Resource Strain: Not on file  Food Insecurity: Not on file  Transportation Needs: Not on file  Physical Activity: Not on file  Stress: Not on file  Social Connections: Not on file  Intimate Partner Violence: Not on file    Family History: Family History  Problem Relation Age of Onset   Colon cancer Father    Esophageal cancer Neg Hx    Rectal cancer Neg Hx     Stomach cancer Neg Hx     Review of Systems: All other systems reviewed and are otherwise negative except as noted above.   Physical Exam: There were no vitals filed for this visit.   GEN- The patient is well appearing, alert and oriented x 3 today.   HEENT: normocephalic, atraumatic; sclera clear, conjunctiva pink; hearing intact; oropharynx clear; neck supple, no JVP Lymph- no cervical lymphadenopathy Lungs- Clear to ausculation bilaterally, normal work of breathing.  No wheezes, rales, rhonchi Heart- Regular rate and rhythm, no murmurs, rubs or gallops, PMI not laterally displaced GI- soft, non-tender, non-distended, bowel sounds present, no hepatosplenomegaly Extremities- no clubbing or cyanosis. No edema; DP/PT/radial pulses 2+ bilaterally MS- no significant deformity or atrophy Skin- warm and dry, no rash or lesion; ICD pocket well healed Psych- euthymic mood, full affect Neuro- strength and sensation are intact  ICD interrogation- reviewed in detail today,  See PACEART report  EKG:  EKG is ordered today. The ekg ordered today shows ***  Recent Labs: 08/22/2020: ALT 13; BUN 19; Creatinine, Ser 1.53; Potassium 5.1; Sodium 144   Wt Readings from Last 3 Encounters:  08/22/20 (!) 316 lb 12.8 oz (143.7 kg)  08/15/20 (!) 306 lb 6.4 oz (139 kg)  06/20/20 292 lb 12.8 oz (132.8 kg)     Other studies Reviewed: Additional studies/ records that were reviewed today include: ***   Assessment and Plan:  1.  Chronic systolic dysfunction s/p Medtronic single chamber ICD  euvolemic today Stable on an appropriate medical regimen Normal ICD function See Pace Art report No changes today   Current medicines are reviewed at length with the patient today.   The patient {ACTIONS; HAS/DOES NOT HAVE:19233} concerns regarding his medicines.  The following changes were made today:  {NONE DEFAULTED:18576}  Labs/ tests ordered today include: *** No orders of the defined types were placed  in this encounter.    Disposition:   Follow up with {Blank single:19197::"Dr. Allred","Dr. Arlan Organ. Klein","Dr. Camnitz","Dr. Lambert","EP APP"}  {gen number [id number]} {TIME; UNITS DAY/WEEK/MONTH:19136}   Signed, Shirley Friar, PA-C  10/30/2020 1:53 PM  [place] [address] [phone] (office) [phone] (fax)

## 2020-10-31 ENCOUNTER — Encounter: Payer: Medicare Other | Admitting: Student

## 2020-10-31 DIAGNOSIS — I10 Essential (primary) hypertension: Secondary | ICD-10-CM

## 2020-10-31 DIAGNOSIS — I5022 Chronic systolic (congestive) heart failure: Secondary | ICD-10-CM

## 2020-11-02 ENCOUNTER — Ambulatory Visit (INDEPENDENT_AMBULATORY_CARE_PROVIDER_SITE_OTHER): Payer: Medicare Other | Admitting: Podiatry

## 2020-11-02 DIAGNOSIS — B351 Tinea unguium: Secondary | ICD-10-CM

## 2020-11-05 ENCOUNTER — Telehealth: Payer: Self-pay

## 2020-11-05 NOTE — Telephone Encounter (Signed)
Returned call to wife per voice mail request.  She said patient is having chest pain and wanted to send remote transmission for review.  Advised not to send remote transmission patient should go to ER for evaluation.  Explained remote transmission will not show if patient is having a heart attack.  She is going to take him to ER.

## 2020-11-14 ENCOUNTER — Ambulatory Visit (HOSPITAL_COMMUNITY)
Admission: RE | Admit: 2020-11-14 | Discharge: 2020-11-14 | Disposition: A | Discharge status: Home / Self Care | Payer: Medicare Other | Source: Ambulatory Visit | Attending: Family Medicine | Admitting: Family Medicine

## 2020-11-14 ENCOUNTER — Other Ambulatory Visit: Payer: Self-pay

## 2020-11-14 ENCOUNTER — Encounter: Payer: Self-pay | Admitting: Family Medicine

## 2020-11-14 ENCOUNTER — Ambulatory Visit (INDEPENDENT_AMBULATORY_CARE_PROVIDER_SITE_OTHER): Payer: Medicare Other | Admitting: Family Medicine

## 2020-11-14 VITALS — BP 98/56 | HR 92 | Ht 62.0 in | Wt 317.0 lb

## 2020-11-14 DIAGNOSIS — E1122 Type 2 diabetes mellitus with diabetic chronic kidney disease: Secondary | ICD-10-CM

## 2020-11-14 DIAGNOSIS — N184 Chronic kidney disease, stage 4 (severe): Secondary | ICD-10-CM

## 2020-11-14 DIAGNOSIS — I4819 Other persistent atrial fibrillation: Secondary | ICD-10-CM | POA: Insufficient documentation

## 2020-11-14 DIAGNOSIS — I5022 Chronic systolic (congestive) heart failure: Secondary | ICD-10-CM

## 2020-11-14 DIAGNOSIS — I480 Paroxysmal atrial fibrillation: Secondary | ICD-10-CM

## 2020-11-14 LAB — POCT GLYCOSYLATED HEMOGLOBIN (HGB A1C): HbA1c, POC (controlled diabetic range): 8.9 % — AB (ref 0.0–7.0)

## 2020-11-14 NOTE — Assessment & Plan Note (Signed)
Continue on Eliquis.  He is in normal sinus rhythm today.  I have discontinued his aspirin.

## 2020-11-14 NOTE — Patient Instructions (Addendum)
R A1C is back up so you need to focus on your blood sugar. Your weight is also a bit up so more walking would be a good idea.  Let me see you back in 2 to 3 months. Please follow up with your heart doctor as scheduled tomorrow. Please make sure the heart doctor knows you have had a few chest pains radiating to the neck. I am getting an EKG today just to check your current rhythm. COntinue your current medicines.  Your EKG was OK

## 2020-11-14 NOTE — Assessment & Plan Note (Signed)
A1c is up.  He would be a good candidate for GLP-1 but financially I will think it is going to be available to him.  He is having so many social issues including housing instability that I think will put further discussion of this off to next office visit.  He does not want to consider any type of additional medicine or financial obligation at this time.

## 2020-11-14 NOTE — Assessment & Plan Note (Addendum)
He appears euvolemic today.  His weight is up however as his A1c.  We discussed that.

## 2020-11-14 NOTE — Progress Notes (Signed)
Electrophysiology Office Note Date: 11/15/2020  ID:  [name], DOB 05-15-1960, MRN [id number]  PCP: Dickie La, MD Primary Cardiologist: None Electrophysiologist: Adam Axe, MD   CC: Routine ICD follow-up / Elevated optivol  [name] is a 60 y.o. male with history of HTN, HLD, DM, TIA, NICM, chronic CHF (systolic), ICD, blindness 2/2 glaucoma, CKD (III) seen today for Adam Axe, MD for routine electrophysiology followup.  Since last being seen in our clinic the patient reports doing OK.  Earlier this week he notices a sudden sharp pain in his left chest that gradually relaxed. Had pain into RIGHT shoulder as well as RIGHT neck. Intermittent epigastric/left chest discomfort with unclear circumstances. By device interrogation, possibly associated with SVT type rhythm, though patient is unsure of time.  He denies nausea or diaphoresis along with it. No syncope. He has not had ICD shocks.  Device History: MDT single chamber ICD implanted 2008 for NICM; gen change 2017 History of appropriate therapy: No History of AAD therapy: No  Past Medical History:  Diagnosis Date   Arthritis    Arthritis of knee, degenerative 10/03/2011   Right > LEFT KNEE oa X-rays were not standing but showed some mild sclerosis and joint space loss of the medial compartment of the right knee. History of arthroscopy right knee. Report of injury as a child but unclear exactly what that was.  :I am aware of 12/01/18 Rx by Dr. Nori Riis.  While wife was hospitalized, narcotic Rx went missing when in-home sitter left.  To my knowledge, this is the first mi   Automatic implantable cardioverter-defibrillator in situ 07/15/2013   Blind in both eyes 06/15/2013   Right Sees Dr Katy Fitch   Cataracts, bilateral    right   Chronic gout due to renal impairment of multiple sites without tophus 08/12/8467   Chronic systolic heart failure ([place]) 02/18/2007   Qualifier: Diagnosis of  By: Unk Lightning MD, Tivis Ringer     Congestive heart failure ([place])    a. s/p MDT single chamber ICD    Diabetes mellitus    does not check blood sugars at home   Diverticulosis    Encounter for chronic pain management 05/17/2014   Indication for chronic opioid: severe arthritis RIGHTknee / low back Medication and dose: hydrocodone---uses prn # pills per month: gets #120 pills to last TWO months Last UDS date: 05/17/2014 Pain contract signed (Y/N): Y 05/17/2014 Date narcotic database last reviewed (include red flags): 05/17/2014    Glaucoma    bilateral   Glaucoma 11/07/2015   Severe See Dr. Katy Fitch   H/O TIA (transient ischemic attack) and stroke    Hyperlipidemia    Hyperlipidemia with target low density lipoprotein (LDL) cholesterol less than 100 mg/dL 08/20/2011   Hypertension    HYPERTENSION, BENIGN 02/18/2007   Qualifier: Diagnosis of  By: Nori Riis MD, Drema Pry of both feet 04/12/2020   NICM (nonischemic cardiomyopathy) ([place]) 05/30/2015   Obesity    OBESITY, MORBID 05/21/2010   Qualifier: Diagnosis of  By: Caryl Comes, MD, Remus Blake    Obstructive sleep apnea 03/27/2009   Qualifier: Diagnosis of  By: Unk Lightning, RN, BSN, Melanie     Paroxysmal atrial fibrillation ([place]) 09/21/2019   Paroxysmal VT ([place]) 06/22/2019   RENAL DISEASE, CHRONIC, STAGE III 02/18/2007   SHOULDER PAIN, LEFT 08/01/2009   History of left shoulder surgery.     Single implantable cardioverter-defibrillator (ICD) in situ 06/17/2011   Type 2 diabetes mellitus  with stage 4 chronic kidney disease, without long-term current use of insulin ([place]) 08/25/2007   Past Surgical History:  Procedure Laterality Date   CARDIAC CATHETERIZATION  10/24/2003   EF of 45-50%   CARDIAC DEFIBRILLATOR PLACEMENT  2008   CATARACT EXTRACTION  2012   right   EP IMPLANTABLE DEVICE N/A 05/30/2015   Procedure: ICD Generator Changeout;  Surgeon: [name], MD;  Location: [facility];  Service: Cardiovascular;  Laterality: N/A;   KNEE SURGERY Right 2000   SHOULDER  SURGERY  2012   left    Current Outpatient Medications  Medication Sig Dispense Refill   allopurinol (ZYLOPRIM) 100 MG tablet Take 0.5 tablets (50 mg total) by mouth daily. 45 tablet 3   blood glucose meter kit and supplies Dispense based on patient and insurance preference. Use up to four times daily as directed. (FOR ICD-10 E10.9, E11.9). 1 each 0   Blood Glucose Monitoring Suppl (ONETOUCH VERIO) w/Device KIT 1 kit by Does not apply route as directed. Use up to four times daily as directed. (FOR ICD-10 E10.9, E11.9). 1 kit 3   brimonidine (ALPHAGAN) 0.2 % ophthalmic solution Place 1 drop into both eyes 2 (two) times daily.     carvedilol (COREG) 25 MG tablet Take 1 tablet (25 mg total) by mouth 2 (two) times daily with a meal.     cetirizine (ZYRTEC ALLERGY) 10 MG tablet Take 1 tablet (10 mg total) by mouth daily. 30 tablet 6   cyclobenzaprine (FLEXERIL) 5 MG tablet Take 1 tablet (5 mg total) by mouth at bedtime. 30 tablet 1   diclofenac Sodium (VOLTAREN) 1 % GEL Apply 2 g topically 4 (four) times daily. 100 g 0   dorzolamide-timolol (COSOPT) 22.3-6.8 MG/ML ophthalmic solution Place 1 drop into both eyes 2 (two) times daily.     ELIQUIS 5 MG TABS tablet Take 1 tablet (5 mg total) by mouth 2 (two) times daily. 180 tablet 0   EPINEPHRINE 0.3 mg/0.3 mL IJ SOAJ injection Inject 0.3 mg into the muscle as needed for anaphylaxis. 2 each 1   fluticasone (FLONASE) 50 MCG/ACT nasal spray Place 2 sprays into both nostrils daily. 16 g 2   fluticasone (FLOVENT HFA) 110 MCG/ACT inhaler Inhale 2 puffs into the lungs 2 (two) times daily. 1 each 12   furosemide (LASIX) 40 MG tablet Take 1 tablet (40 mg total) by mouth as directed. 90 tablet 0   glimepiride (AMARYL) 4 MG tablet Take 4 mg by mouth daily.     glucose blood (ONETOUCH VERIO) test strip Use as instructed. Use up to four times daily as directed. (FOR ICD-10 E10.9, E11.9). 100 each 12   HYDROcodone-acetaminophen (NORCO/VICODIN) 5-325 MG tablet Take 2  in AM one at mid day and 2 at bedtime prn knee pain 150 tablet 0   insulin glargine (LANTUS SOLOSTAR) 100 UNIT/ML Solostar Pen Inject 25 units into skin once daily 27 mL 3   ipratropium (ATROVENT) 0.03 % nasal spray Place 2 sprays into both nostrils every 12 (twelve) hours. 30 mL 12   isosorbide mononitrate (ISMO) 20 MG tablet TAKE ONE TABLET BY MOUTH TWICE DAILY 180 tablet 3   latanoprost (XALATAN) 0.005 % ophthalmic solution Place 1 drop into both eyes every evening.     NARCAN 4 MG/0.1ML LIQD nasal spray kit      nitroGLYCERIN (NITROSTAT) 0.4 MG SL tablet Place 0.4 mg under the tongue as needed.     NOVOLOG 100 UNIT/ML injection SMARTSIG:5 Unit(s) SUB-Q  3 Times Daily     rosuvastatin (CRESTOR) 10 MG tablet Take 1 tablet (10 mg total) by mouth daily. Replaces lipitor 90 tablet 3   sacubitril-valsartan (ENTRESTO) 49-51 MG Take 1 tablet by mouth 2 (two) times daily.     spironolactone (ALDACTONE) 25 MG tablet Take 25 mg by mouth 2 (two) times daily.      SURE COMFORT PEN NEEDLES 32G X 6 MM MISC 1 Units by Does not apply route in the morning, at noon, in the evening, and at bedtime. 100 each 1   TRUEPLUS INSULIN SYRINGE 31G X 5/16" 0.3 ML MISC      No current facility-administered medications for this visit.    Allergies:   Bee pollen and Nsaids   Social History: Social History   Socioeconomic History   Marital status: Legally Separated    Spouse name: Not on file   Number of children: Not on file   Years of education: Not on file   Highest education level: Not on file  Occupational History   Not on file  Tobacco Use   Smoking status: Never   Smokeless tobacco: Never  Vaping Use   Vaping Use: Never used  Substance and Sexual Activity   Alcohol use: No   Drug use: No   Sexual activity: Not on file  Other Topics Concern   Not on file  Social History Narrative   Not on file   Social Determinants of Health   Financial Resource Strain: Not on file  Food Insecurity: Not on  file  Transportation Needs: Not on file  Physical Activity: Not on file  Stress: Not on file  Social Connections: Not on file  Intimate Partner Violence: Not on file    Family History: Family History  Problem Relation Age of Onset   Colon cancer Father    Esophageal cancer Neg Hx    Rectal cancer Neg Hx    Stomach cancer Neg Hx     Review of Systems: All other systems reviewed and are otherwise negative except as noted above.   Physical Exam: Vitals:   11/15/20 0953  BP: 110/74  Pulse: 79  SpO2: 96%  Weight: (!) 318 lb 12.8 oz (144.6 kg)  Height: 5' 7"  (1.702 m)     GEN- The patient is well appearing, alert and oriented x 3 today.   HEENT: normocephalic, atraumatic; sclera clear, conjunctiva pink; hearing intact; oropharynx clear; neck supple, no JVP Lymph- no cervical lymphadenopathy Lungs- Clear to ausculation bilaterally, normal work of breathing.  No wheezes, rales, rhonchi Heart- Regular rate and rhythm, no murmurs, rubs or gallops, PMI not laterally displaced GI- soft, non-tender, non-distended, bowel sounds present, no hepatosplenomegaly Extremities- no clubbing or cyanosis. No edema; DP/PT/radial pulses 2+ bilaterally MS- no significant deformity or atrophy Skin- warm and dry, no rash or lesion; ICD pocket well healed Psych- euthymic mood, full affect Neuro- strength and sensation are intact  ICD interrogation- reviewed in detail today,  See PACEART report  EKG:  EKG is not ordered today.  Recent Labs: 08/22/2020: ALT 13; BUN 19; Creatinine, Ser 1.53; Potassium 5.1; Sodium 144   Wt Readings from Last 3 Encounters:  11/15/20 (!) 318 lb 12.8 oz (144.6 kg)  11/14/20 (!) 317 lb (143.8 kg)  08/22/20 (!) 316 lb 12.8 oz (143.7 kg)     Other studies Reviewed: Additional studies/ records that were reviewed today include: Previous EP office notes   Assessment and Plan:  1.  Chronic systolic dysfunction s/p Medtronic  single chamber ICD  Echo 09/2019 LVEF  55-60% euvolemic today Stable on an appropriate medical regimen of ARNi, nitrate, BB, MRA, and lasix. Normal ICD function See Claudia Desanctis Art report No changes today  2. Paroxysmal atrial fibrillation Continue Eliquis for CHA2DS2/VASc of at least 5.  ? Paroxysmal SVT vs AF RVR on device interrogation. If burden increased could consider monitoring.  3. HTN Continue current regiment  4. Chest pain, typical and atypical features Encouraged to try NTG if recurs Will update Myoview.   Echo 09/2019 LVEF 55-60% without WMA which is reassuring. Low threshold to update.   Current medicines are reviewed at length with the patient today.   The patient does not have concerns regarding his medicines.  The following changes were made today:  none  Labs/ tests ordered today include:  Orders Placed This Encounter  Procedures   Comprehensive metabolic panel   CBC   TSH   Myocardial Perfusion Imaging    Disposition:   Follow up with Dr. Caryl Comes  6 months. Sooner pending Myoview.   Jacalyn Lefevre, PA-C  11/15/2020 10:09 AM  [facility] [address] [place] [phone] (office) [phone] (fax)

## 2020-11-14 NOTE — Progress Notes (Signed)
    CHIEF COMPLAINT / HPI: #1.  Follow-up diabetes mellitus.  Says he is "been a good boy".  Not been doing any regular walking however.  Not checking his blood sugars. 2.  Social issues: He is having housing issues again.  May have to change to a different apartment.  He was living with his wife and his son and his son has run into some legal issues so he is no longer living in the house.  His wife is not currently living with him either.  His sister and his neighbor are patching together a support system for him.  Right now he does not have a phone. #3.  He has had some epigastric pain that radiates up into the right side of his neck.  Says it sharp.  Starts out little bit like heartburn but goes all the way up his chest and into the right side of his neck.  Is happened once or twice in the last month.  Happens at rest.  Lasts less than 30 seconds usually.  Not having any chest pains with exertion.  He is not very active currently. 4.  Cardiovascular disease: He has a follow-up appointment with one of his cardiologist tomorrow.  He saw his general cardiologist, Dr. Terrence Dupont, a couple of months ago.  He is no longer on aspirin but he continues on Eliquis for his paroxysmal atrial fibrillation.  He has some questions about what the Eliquis is for.   PERTINENT  PMH / PSH: I have reviewed the patient's medications, allergies, past medical and surgical history, smoking status and updated in the EMR as appropriate.   OBJECTIVE:  BP (!) 98/56   Pulse 92   Ht '5\' 2"'$  (1.575 m)   Wt (!) 317 lb (143.8 kg)   SpO2 95%   BMI 57.98 kg/m  Vital signs reviewed. GENERAL: Well-developed, well-nourished, no acute distress. CARDIOVASCULAR: Regular rate and rhythm no murmur gallop or rub.  EKG shows normal sinus rhythm. LUNGS: Clear to auscultation bilaterally, no rales or wheeze. ABDOMEN: Soft positive bowel sounds NEURO: No gross focal neurological deficits except for blindness and decreased sensation to soft  touch bilateral lower extremities from the mid shin down. MSK: Movement of extremity x 4.  As I walk him out to the waiting room, he does get a little short of breath with this exertion.   ASSESSMENT / PLAN:  Health maintenance:.  He has not had a colonoscopy.  Given his current issues with housing instability and personal marital issues, we will rediscuss this at a future date.  Paroxysmal atrial fibrillation (HCC) Continue on Eliquis.  He is in normal sinus rhythm today.  I have discontinued his aspirin.  Chronic systolic heart failure ([place]) He appears euvolemic today.  His weight is up however as his A1c.  We discussed that.  Type 2 diabetes mellitus with stage 4 chronic kidney disease, without long-term current use of insulin (HCC) A1c is up.  He would be a good candidate for GLP-1 but financially I will think it is going to be available to him.  He is having so many social issues including housing instability that I think will put further discussion of this off to next office visit.  He does not want to consider any type of additional medicine or financial obligation at this time.   [name] MD

## 2020-11-15 ENCOUNTER — Ambulatory Visit (INDEPENDENT_AMBULATORY_CARE_PROVIDER_SITE_OTHER): Payer: Medicare Other | Admitting: Student

## 2020-11-15 VITALS — BP 110/74 | HR 79 | Ht 67.0 in | Wt 318.8 lb

## 2020-11-15 DIAGNOSIS — I428 Other cardiomyopathies: Secondary | ICD-10-CM

## 2020-11-15 DIAGNOSIS — I480 Paroxysmal atrial fibrillation: Secondary | ICD-10-CM | POA: Diagnosis not present

## 2020-11-15 DIAGNOSIS — I5022 Chronic systolic (congestive) heart failure: Secondary | ICD-10-CM

## 2020-11-15 DIAGNOSIS — R079 Chest pain, unspecified: Secondary | ICD-10-CM

## 2020-11-15 DIAGNOSIS — I10 Essential (primary) hypertension: Secondary | ICD-10-CM

## 2020-11-15 LAB — CUP PACEART INCLINIC DEVICE CHECK
Battery Remaining Longevity: 73 mo
Battery Voltage: 3 V
Brady Statistic RV Percent Paced: 0.27 %
Date Time Interrogation Session: 20220811105427
HighPow Impedance: 51 Ohm
HighPow Impedance: 67 Ohm
Implantable Lead Implant Date: 20081114
Implantable Lead Location: 753860
Implantable Lead Model: 6947
Implantable Pulse Generator Implant Date: 20170222
Lead Channel Impedance Value: 342 Ohm
Lead Channel Impedance Value: 399 Ohm
Lead Channel Pacing Threshold Amplitude: 0.5 V
Lead Channel Pacing Threshold Pulse Width: 0.4 ms
Lead Channel Sensing Intrinsic Amplitude: 11.5 mV
Lead Channel Sensing Intrinsic Amplitude: 7.125 mV
Lead Channel Setting Pacing Amplitude: 2.5 V
Lead Channel Setting Pacing Pulse Width: 0.4 ms
Lead Channel Setting Sensing Sensitivity: 0.3 mV

## 2020-11-15 LAB — COMPREHENSIVE METABOLIC PANEL
ALT: 11 IU/L (ref 0–44)
AST: 13 IU/L (ref 0–40)
Albumin/Globulin Ratio: 1.4 (ref 1.2–2.2)
Albumin: 4.2 g/dL (ref 3.8–4.8)
Alkaline Phosphatase: 78 IU/L (ref 44–121)
BUN/Creatinine Ratio: 17 (ref 10–24)
BUN: 35 mg/dL — ABNORMAL HIGH (ref 8–27)
Bilirubin Total: 0.6 mg/dL (ref 0.0–1.2)
CO2: 18 mmol/L — ABNORMAL LOW (ref 20–29)
Calcium: 9 mg/dL (ref 8.6–10.2)
Chloride: 108 mmol/L — ABNORMAL HIGH (ref 96–106)
Creatinine, Ser: 2.09 mg/dL — ABNORMAL HIGH (ref 0.76–1.27)
Globulin, Total: 3.1 g/dL (ref 1.5–4.5)
Glucose: 172 mg/dL — ABNORMAL HIGH (ref 65–99)
Potassium: 5.2 mmol/L (ref 3.5–5.2)
Sodium: 142 mmol/L (ref 134–144)
Total Protein: 7.3 g/dL (ref 6.0–8.5)
eGFR: 35 mL/min/{1.73_m2} — ABNORMAL LOW (ref >59)

## 2020-11-15 LAB — CBC
Hematocrit: 42.5 % (ref 37.5–51.0)
Hemoglobin: 14.5 g/dL (ref 13.0–17.7)
MCH: 28.3 pg (ref 26.6–33.0)
MCHC: 34.1 g/dL (ref 31.5–35.7)
MCV: 83 fL (ref 79–97)
Platelets: 240 10*3/uL (ref 150–450)
RBC: 5.13 x10E6/uL (ref 4.14–5.80)
RDW: 13.7 % (ref 11.6–15.4)
WBC: 8.3 10*3/uL (ref 3.4–10.8)

## 2020-11-15 LAB — TSH: TSH: 2.6 u[IU]/mL (ref 0.450–4.500)

## 2020-11-15 NOTE — Patient Instructions (Signed)
Medication Instructions:  Your physician recommends that you continue on your current medications as directed. Please refer to the Current Medication list given to you today.  *If you need a refill on your cardiac medications before your next appointment, please call your pharmacy*   Lab Work: TODAY: CMET, CBC, TSH  If you have labs (blood work) drawn today and your tests are completely normal, you will receive your results only by: [place] (if you have MyChart) OR A paper copy in the mail If you have any lab test that is abnormal or we need to change your treatment, we will call you to review the results.   Testing/Procedures: Your physician has requested that you have a lexiscan myoview. For further information please visit [email or web address]. Please follow instruction sheet, as given.   Follow-Up: At [facility], you and your health needs are our priority.  As part of our continuing mission to provide you with exceptional heart care, we have created designated Provider Care Teams.  These Care Teams include your primary Cardiologist (physician) and Advanced Practice Providers (APPs -  Physician Assistants and Nurse Practitioners) who all work together to provide you with the care you need, when you need it.  We recommend signing up for the patient portal called "MyChart".  Sign up information is provided on this After Visit Summary.  MyChart is used to connect with patients for Virtual Visits (Telemedicine).  Patients are able to view lab/test results, encounter notes, upcoming appointments, etc.  Non-urgent messages can be sent to your provider as well.   To learn more about what you can do with MyChart, go to [email or web address].    Your next appointment:   6 month(s)  The format for your next appointment:   In Person  Provider:   You may see [name], MD or one of the following Advanced Practice Providers on your designated Care Team:   Tommye Standard,  [place] [facility]" [place], [place]

## 2020-11-19 ENCOUNTER — Other Ambulatory Visit: Payer: Self-pay | Admitting: Family Medicine

## 2020-11-19 ENCOUNTER — Ambulatory Visit (INDEPENDENT_AMBULATORY_CARE_PROVIDER_SITE_OTHER): Payer: Medicare Other

## 2020-11-19 ENCOUNTER — Telehealth: Payer: Self-pay | Admitting: Family Medicine

## 2020-11-19 ENCOUNTER — Other Ambulatory Visit: Payer: Self-pay

## 2020-11-19 DIAGNOSIS — Z95810 Presence of automatic (implantable) cardiac defibrillator: Secondary | ICD-10-CM

## 2020-11-19 DIAGNOSIS — G8929 Other chronic pain: Secondary | ICD-10-CM

## 2020-11-19 DIAGNOSIS — I5022 Chronic systolic (congestive) heart failure: Secondary | ICD-10-CM | POA: Diagnosis not present

## 2020-11-19 DIAGNOSIS — M170 Bilateral primary osteoarthritis of knee: Secondary | ICD-10-CM

## 2020-11-19 DIAGNOSIS — S39012A Strain of muscle, fascia and tendon of lower back, initial encounter: Secondary | ICD-10-CM

## 2020-11-19 MED ORDER — HYDROCODONE-ACETAMINOPHEN 5-325 MG PO TABS: ORAL_TABLET | ORAL | 0 refills | Status: DC

## 2020-11-19 NOTE — Telephone Encounter (Signed)
Refill request sent to PCP. Ottis Stain, CMA

## 2020-11-19 NOTE — Telephone Encounter (Signed)
Patient's wife is calling and would like to have his hydrocodone refilled.

## 2020-11-20 ENCOUNTER — Other Ambulatory Visit: Payer: Self-pay | Admitting: Student

## 2020-11-20 DIAGNOSIS — R079 Chest pain, unspecified: Secondary | ICD-10-CM

## 2020-11-21 NOTE — Progress Notes (Addendum)
EPIC Encounter for ICM Monitoring  Patient Name: [name] is a 60 y.o. male Date: 11/21/2020 Primary Care Physican: Dickie La, MD Primary Cardiologist: Terrence Dupont  Electrophysiologist: Caryl Comes 11/15/2020 Weight: 318 lbs     Attempted call to patient and unable to reach.  Transmission reviewed.    Optivol thoracic impedance suggesting fluid levels have returned to normal.   Prescribed: Furosemide 40 mg Take 1 tablet by mouth as directed.     Labs: 08/22/2020 Creatinine 1.53, BUN 19, Potassium 5.1, Sodium 144 05/28/2020 Creatinine 1.52, BUN 9,   Potassium 4.1, Sodium 141, GFR 49-56 A complete set of results can be found in Results Review.   Recommendations:  Unable to reach.     Follow-up plan: ICM clinic phone appointment on 12/24/2020.   91 day device clinic remote transmission 12/13/2020.           EP/Cardiology Office Visits: Last EP visit 11/15/2020 with Oda Kilts, PA but no recalls for future appointments.   Copy of ICM check sent to Dr. Caryl Comes.    3 month ICM trend: 11/19/2020.    1 Year ICM trend:       Rosalene Billings, RN 11/21/2020 12:29 PM

## 2020-11-27 ENCOUNTER — Telehealth (HOSPITAL_COMMUNITY): Payer: Self-pay | Admitting: *Deleted

## 2020-11-27 NOTE — Telephone Encounter (Signed)
Left message on voicemail per DPR in reference to upcoming appointment scheduled on 12/03/20 at 8:15 with detailed instructions given per Myocardial Perfusion Study Information Sheet for the test. LM to arrive 15 minutes early, and that it is imperative to arrive on time for appointment to keep from having the test rescheduled. If you need to cancel or reschedule your appointment, please call the office within 24 hours of your appointment. Failure to do so may result in a cancellation of your appointment, and a $50 no show fee. Phone number given for call back for any questions.

## 2020-11-30 ENCOUNTER — Other Ambulatory Visit: Payer: Self-pay

## 2020-11-30 DIAGNOSIS — I5022 Chronic systolic (congestive) heart failure: Secondary | ICD-10-CM

## 2020-12-03 ENCOUNTER — Other Ambulatory Visit: Payer: Self-pay

## 2020-12-03 ENCOUNTER — Other Ambulatory Visit: Payer: Medicare Other | Admitting: *Deleted

## 2020-12-03 ENCOUNTER — Ambulatory Visit (HOSPITAL_COMMUNITY): Payer: Medicare Other | Attending: Student

## 2020-12-03 DIAGNOSIS — I5022 Chronic systolic (congestive) heart failure: Secondary | ICD-10-CM

## 2020-12-03 DIAGNOSIS — R079 Chest pain, unspecified: Secondary | ICD-10-CM | POA: Insufficient documentation

## 2020-12-03 LAB — BASIC METABOLIC PANEL
BUN/Creatinine Ratio: 12 (ref 10–24)
BUN: 21 mg/dL (ref 8–27)
CO2: 21 mmol/L (ref 20–29)
Calcium: 9 mg/dL (ref 8.6–10.2)
Chloride: 107 mmol/L — ABNORMAL HIGH (ref 96–106)
Creatinine, Ser: 1.75 mg/dL — ABNORMAL HIGH (ref 0.76–1.27)
Glucose: 145 mg/dL — ABNORMAL HIGH (ref 65–99)
Potassium: 4.7 mmol/L (ref 3.5–5.2)
Sodium: 140 mmol/L (ref 134–144)
eGFR: 43 mL/min/{1.73_m2} — ABNORMAL LOW (ref >59)

## 2020-12-03 MED ORDER — REGADENOSON 0.4 MG/5ML IV SOLN
0.4000 mg | Freq: Once | INTRAVENOUS | Status: AC
  Administered 2020-12-03: 0.4 mg via INTRAVENOUS

## 2020-12-03 MED ORDER — TECHNETIUM TC 99M TETROFOSMIN IV KIT
32.7000 | PACK | Freq: Once | INTRAVENOUS | Status: AC | PRN
  Administered 2020-12-03: 32.7 via INTRAVENOUS
  Filled 2020-12-03: qty 33

## 2020-12-04 ENCOUNTER — Ambulatory Visit (HOSPITAL_COMMUNITY): Payer: Medicare Other | Attending: Internal Medicine

## 2020-12-04 LAB — MYOCARDIAL PERFUSION IMAGING
Base ST Depression (mm): 0 mm
LV dias vol: 167 mL (ref 62–150)
LV sys vol: 94 mL
Nuc Stress EF: 44 %
Peak HR: 102 {beats}/min
Rest HR: 73 {beats}/min
Rest Nuclear Isotope Dose: 32.6 mCi
SDS: 1
SRS: 2
SSS: 3
ST Depression (mm): 0 mm
Stress Nuclear Isotope Dose: 32.7 mCi
TID: 0.87

## 2020-12-04 MED ORDER — TECHNETIUM TC 99M TETROFOSMIN IV KIT
32.6000 | PACK | Freq: Once | INTRAVENOUS | Status: AC | PRN
  Administered 2020-12-04: 32.6 via INTRAVENOUS
  Filled 2020-12-04: qty 33

## 2020-12-05 ENCOUNTER — Other Ambulatory Visit: Payer: Self-pay

## 2020-12-05 DIAGNOSIS — I5022 Chronic systolic (congestive) heart failure: Secondary | ICD-10-CM

## 2020-12-13 ENCOUNTER — Ambulatory Visit (INDEPENDENT_AMBULATORY_CARE_PROVIDER_SITE_OTHER): Payer: Medicare Other

## 2020-12-13 DIAGNOSIS — I428 Other cardiomyopathies: Secondary | ICD-10-CM | POA: Diagnosis not present

## 2020-12-13 LAB — CUP PACEART REMOTE DEVICE CHECK
Battery Remaining Longevity: 75 mo
Battery Voltage: 2.94 V
Brady Statistic RV Percent Paced: 0.01 %
Date Time Interrogation Session: 20220908063427
HighPow Impedance: 44 Ohm
HighPow Impedance: 50 Ohm
Implantable Lead Implant Date: 20081114
Implantable Lead Location: 753860
Implantable Lead Model: 6947
Implantable Pulse Generator Implant Date: 20170222
Lead Channel Impedance Value: 342 Ohm
Lead Channel Impedance Value: 437 Ohm
Lead Channel Pacing Threshold Amplitude: 0.625 V
Lead Channel Pacing Threshold Pulse Width: 0.4 ms
Lead Channel Sensing Intrinsic Amplitude: 7.375 mV
Lead Channel Sensing Intrinsic Amplitude: 7.375 mV
Lead Channel Setting Pacing Amplitude: 2.5 V
Lead Channel Setting Pacing Pulse Width: 0.4 ms
Lead Channel Setting Sensing Sensitivity: 0.3 mV

## 2020-12-18 IMAGING — DX DG CHEST 1V PORT
2 series · 2 of 2 positions shown · non-contrast
Comparison: 09/09/2019

CLINICAL DATA: Coughing and shortness of breath with wheezing for 5
days.

EXAM:
PORTABLE CHEST 1 VIEW

[chest ap (1 of 2)]
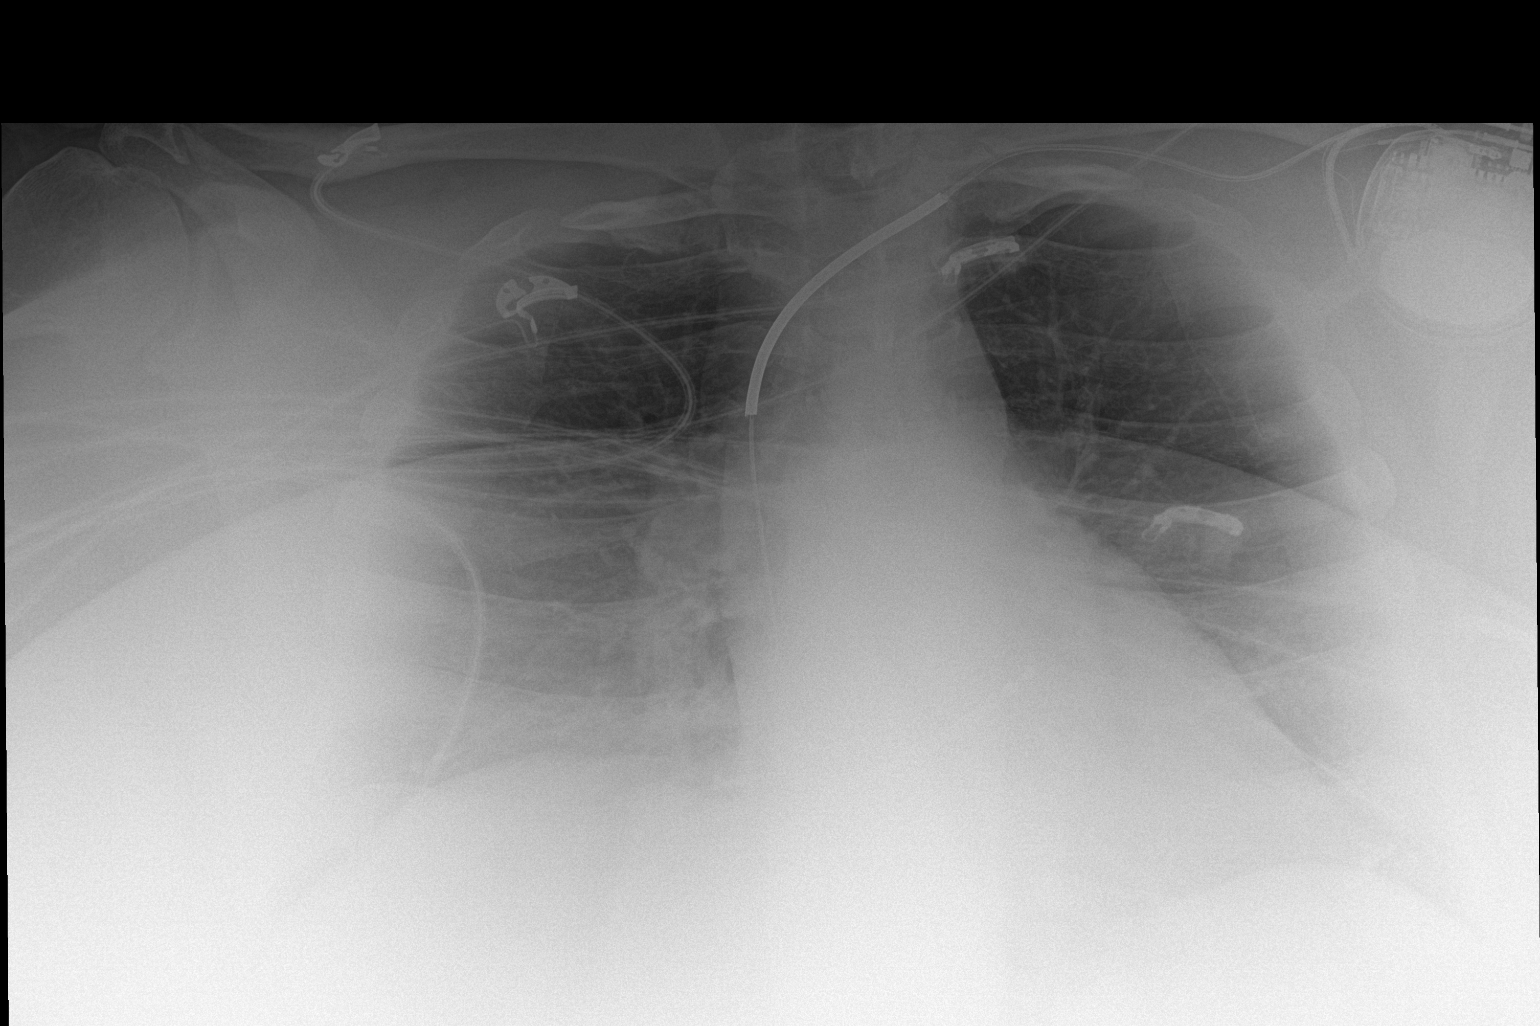

[chest ap (2 of 2)]
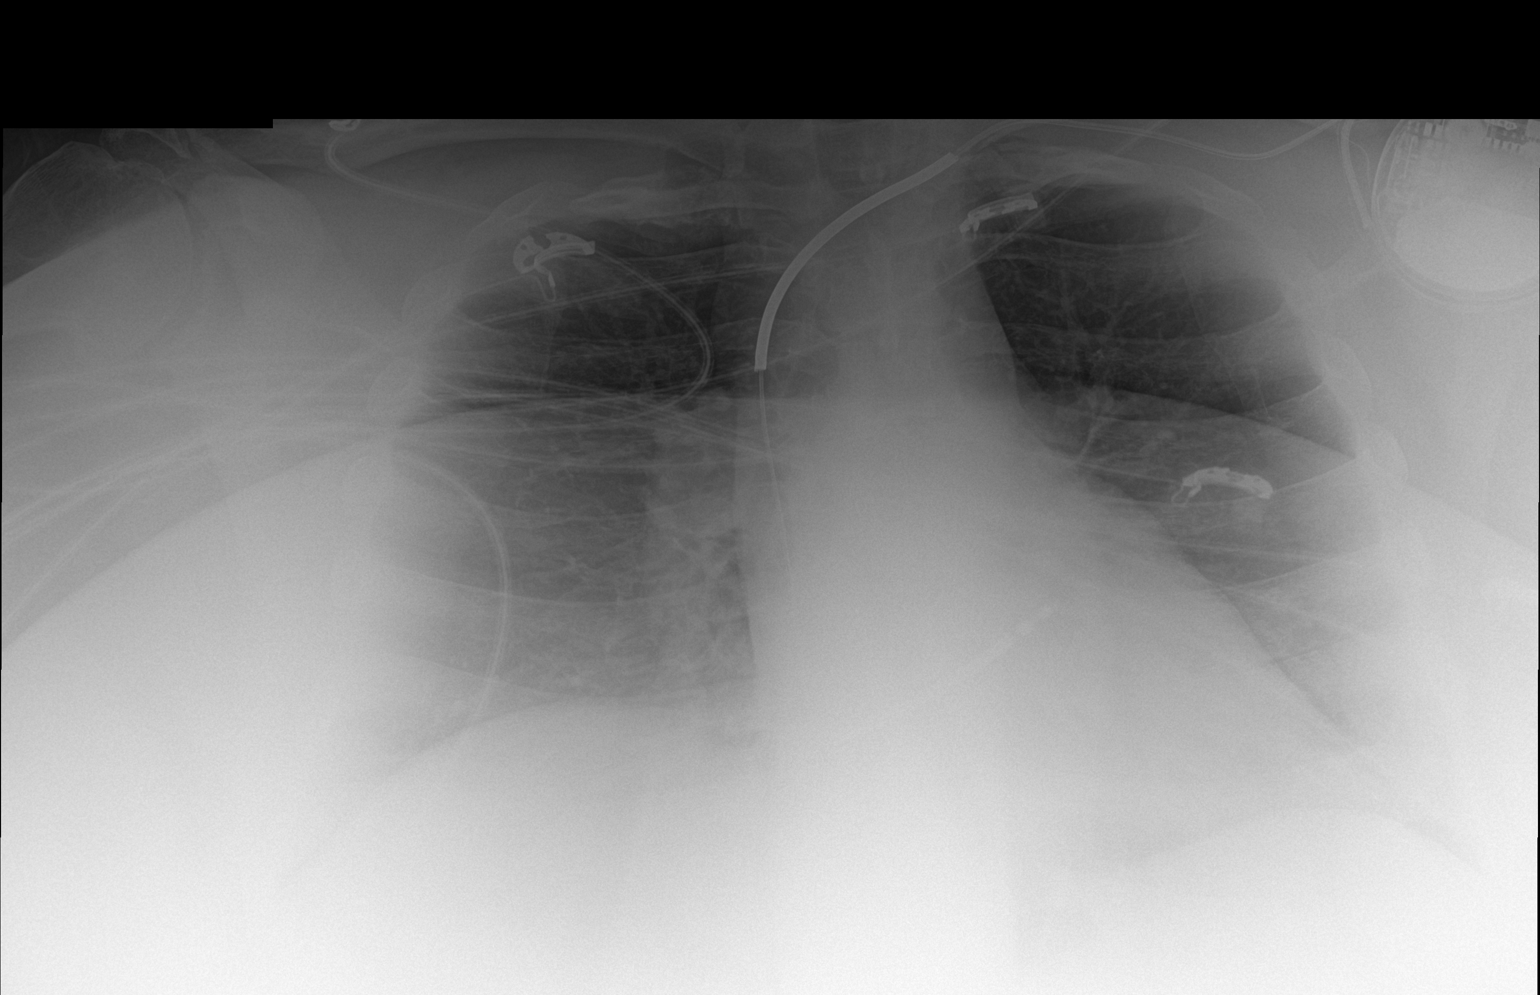

[2 of 2 positions shown; findings below may reference images not displayed]

FINDINGS: Study limited by the semi-erect AP technique, low lung volumes and
the patient's body habitus.

Allowing for the above, the cardiac silhouette is normal in size.
Stable left anterior chest wall AICD. No mediastinal or hilar
masses.

Clear lungs.  No convincing pleural effusion or pneumothorax.

Skeletal structures are grossly intact.
IMPRESSION: No active disease.

## 2020-12-19 ENCOUNTER — Other Ambulatory Visit: Payer: Self-pay | Admitting: *Deleted

## 2020-12-19 DIAGNOSIS — G8929 Other chronic pain: Secondary | ICD-10-CM

## 2020-12-19 DIAGNOSIS — M170 Bilateral primary osteoarthritis of knee: Secondary | ICD-10-CM

## 2020-12-19 NOTE — Telephone Encounter (Signed)
Wife called on behalf of patient stating that he needs a refill on his pain medication.  Will forward to MD.  They ask the son (caregiver at this time) be called when it has been ordered. [phone].   Thanks [employer]

## 2020-12-20 ENCOUNTER — Other Ambulatory Visit: Payer: Self-pay | Admitting: Family Medicine

## 2020-12-20 DIAGNOSIS — G8929 Other chronic pain: Secondary | ICD-10-CM

## 2020-12-20 DIAGNOSIS — M170 Bilateral primary osteoarthritis of knee: Secondary | ICD-10-CM

## 2020-12-20 DIAGNOSIS — S39012A Strain of muscle, fascia and tendon of lower back, initial encounter: Secondary | ICD-10-CM

## 2020-12-20 MED ORDER — HYDROCODONE-ACETAMINOPHEN 5-325 MG PO TABS: ORAL_TABLET | ORAL | 0 refills | Status: DC

## 2020-12-20 NOTE — Telephone Encounter (Signed)
Wife has been notified.  Anaily Ashbaugh,CMA

## 2020-12-21 NOTE — Progress Notes (Signed)
Remote ICD transmission.   

## 2020-12-24 ENCOUNTER — Ambulatory Visit (INDEPENDENT_AMBULATORY_CARE_PROVIDER_SITE_OTHER): Payer: Medicare Other

## 2020-12-24 DIAGNOSIS — Z95810 Presence of automatic (implantable) cardiac defibrillator: Secondary | ICD-10-CM | POA: Diagnosis not present

## 2020-12-24 DIAGNOSIS — I5022 Chronic systolic (congestive) heart failure: Secondary | ICD-10-CM

## 2020-12-25 ENCOUNTER — Telehealth: Payer: Self-pay

## 2020-12-25 NOTE — Progress Notes (Signed)
EPIC Encounter for ICM Monitoring  Patient Name: [name] is a 60 y.o. male Date: 12/25/2020 Primary Care Physican: Dickie La, MD Primary Cardiologist: Terrence Dupont  Electrophysiologist: Caryl Comes 11/15/2020 Weight: 318 lbs     Attempted call to patient and unable to reach.  Transmission reviewed.    Optivol thoracic impedance suggesting fluid levels have returned to normal.   Prescribed: Furosemide 40 mg Take 1 tablet by mouth as directed.     Labs: 12/03/2020 Creatinine 1.75, BUN 21, Potassium 4.7, Sodium 140, GFR 43 11/15/2020 Creatinine 2.09, BUN 35, Potassium 5.2, Sodium 142, GFR 35  08/22/2020 Creatinine 1.53, BUN 19, Potassium 5.1, Sodium 144 05/28/2020 Creatinine 1.52, BUN 9,   Potassium 4.1, Sodium 141, GFR 49-56 A complete set of results can be found in Results Review.   Recommendations:  Unable to reach.     Follow-up plan: ICM clinic phone appointment on 01/28/2021.   91 day device clinic remote transmission 03/14/2021.           EP/Cardiology Office Visits: Last EP visit 11/15/2020 with Oda Kilts, PA but no recalls for future appointments.   Copy of ICM check sent to Dr. Caryl Comes.     3 month ICM trend: 12/24/2020.    1 Year ICM trend:       Rosalene Billings, RN 12/25/2020 4:54 PM

## 2020-12-25 NOTE — Telephone Encounter (Signed)
Remote ICM transmission received.  Attempted call to patient regarding ICM remote transmission and no answer or voice mail option.  

## 2020-12-26 ENCOUNTER — Ambulatory Visit (HOSPITAL_COMMUNITY): Payer: Medicare Other | Attending: Cardiology

## 2020-12-26 ENCOUNTER — Other Ambulatory Visit: Payer: Self-pay

## 2020-12-26 DIAGNOSIS — I5022 Chronic systolic (congestive) heart failure: Secondary | ICD-10-CM | POA: Diagnosis not present

## 2020-12-26 LAB — ECHOCARDIOGRAM COMPLETE
Area-P 1/2: 2.76 cm2
S' Lateral: 4.2 cm

## 2020-12-26 MED ORDER — PERFLUTREN LIPID MICROSPHERE
1.0000 mL | INTRAVENOUS | Status: AC | PRN
  Administered 2020-12-26: 3 mL via INTRAVENOUS

## 2020-12-28 ENCOUNTER — Telehealth: Payer: Self-pay | Admitting: Student

## 2020-12-28 ENCOUNTER — Other Ambulatory Visit: Payer: Self-pay

## 2020-12-28 DIAGNOSIS — I5022 Chronic systolic (congestive) heart failure: Secondary | ICD-10-CM

## 2020-12-28 NOTE — Progress Notes (Signed)
Amb re 

## 2020-12-28 NOTE — Telephone Encounter (Signed)
I spoke with pt's Sister earlier this morning and also just spoke with the pt and his Wife was also on the call.. I gave them Echo results and put referral in for Pharmacist. They are all aware that someone will call them to schedule this appt.

## 2020-12-28 NOTE — Telephone Encounter (Signed)
  Aldona Bar calling to get pt's echo result, she said she will only be with pt this morning and if someone can call them back as soon as possible

## 2021-01-02 ENCOUNTER — Ambulatory Visit (INDEPENDENT_AMBULATORY_CARE_PROVIDER_SITE_OTHER): Payer: Medicare Other | Admitting: Pharmacist

## 2021-01-02 ENCOUNTER — Other Ambulatory Visit: Payer: Self-pay

## 2021-01-02 VITALS — BP 126/84 | HR 63 | Wt 315.0 lb

## 2021-01-02 DIAGNOSIS — I5022 Chronic systolic (congestive) heart failure: Secondary | ICD-10-CM

## 2021-01-02 MED ORDER — DAPAGLIFLOZIN PROPANEDIOL 10 MG PO TABS: 10.0000 mg | ORAL_TABLET | Freq: Every day | ORAL | 11 refills | Status: DC

## 2021-01-02 MED ORDER — APIXABAN 5 MG PO TABS: 5.0000 mg | ORAL_TABLET | Freq: Two times a day (BID) | ORAL | 1 refills | Status: DC

## 2021-01-02 MED ORDER — DAPAGLIFLOZIN PROPANEDIOL 10 MG PO TABS: 10.0000 mg | ORAL_TABLET | Freq: Every day | ORAL | 3 refills | Status: DC

## 2021-01-02 NOTE — Patient Instructions (Addendum)
It was nice to see you today  Your blood pressure is at goal < 130/54mHg  Start taking Farxiga '10mg'$  - 1 tablet once daily in the morning  FWilder Gladehelps to strengthen the pumping function of your heart, lowers your blood sugar, and helps to protect your kidneys  Continue taking your other medications  Dr HTerrence Dupontcan recheck labs when you see him next month - BMP

## 2021-01-02 NOTE — Progress Notes (Addendum)
Patient ID: KAAN TOSH                 DOB: Apr 02, 1961                      MRN: [id number]     HPI: [name] is a 60 y.o. male patient of Dr Caryl Comes referred by Oda Kilts, PA to pharmacy clinic for HF medication management. PMH is significant for afib, NICM, chronic CHF, single chamber ICD in place, TIA, DM, HTN, HLD, CKD III, gout, obesity, and blindness. Most recent LVEF 40-45% on 12/26/20, down from 55-60% on 09/2019 echo.  Pt presents today accompanied by his sister. Per PCP visit on 8/10, pt not walking or checking blood sugar. Having housing issues, doesn't currently have a phone. Reports doing well today. Tolerating medications, denies dizziness, LE edema, and headache. Takes his furosemide only as needed for swelling - has taken 4 doses in the past week. Primary cardiologist is [name]. Doesn't know what med insurance he has but reports his other branded meds are all affordable. All home meds brought to visit today and match up with our med list.  Current CHF meds: carvedilol 42m BID, Entresto 49-541mBID, spironolactone 2517mID, furosemide 54m50mily - taking prn, isosorbide mononitrate 20mg44m  BP goal: <130/80mmH2mamily History: Father with colon cancer.  Social History: Denies tobacco, alcohol and drug use.  Home BP readings: does not check  Labs: 12/03/20: SCr 1.75, eGFR 43, K 4.7, Na 140 11/14/20: A1c 8.9% (previously 14.3% 12/28/19)  Wt Readings from Last 3 Encounters:  12/03/20 (!) 318 lb (144.2 kg)  11/15/20 (!) 318 lb 12.8 oz (144.6 kg)  11/14/20 (!) 317 lb (143.8 kg)   BP Readings from Last 3 Encounters:  11/15/20 110/74  11/14/20 (!) 98/56  08/22/20 118/76   Pulse Readings from Last 3 Encounters:  11/15/20 79  11/14/20 92  08/22/20 67    Renal function: CrCl cannot be calculated (Patient's most recent lab result is older than the maximum 21 days allowed.).  Past Medical History:  Diagnosis Date   Arthritis    Arthritis of knee,  degenerative 10/03/2011   Right > LEFT KNEE oa X-rays were not standing but showed some mild sclerosis and joint space loss of the medial compartment of the right knee. History of arthroscopy right knee. Report of injury as a child but unclear exactly what that was.  :I am aware of 12/01/18 Rx by Dr. Neal. Nori Riisle wife was hospitalized, narcotic Rx went missing when in-home sitter left.  To my knowledge, this is the first mi   Automatic implantable cardioverter-defibrillator in situ 07/15/2013   Blind in both eyes 06/15/2013   Right Sees Dr Groat Katy Fitcharacts, bilateral    right   Chronic gout due to renal impairment of multiple sites without tophus 1/6/207/3/2202onic systolic heart failure (HCC) 1[place]3/2008   Qualifier: Diagnosis of  By: ClareyUnk LightningristeTivis Ringerngestive heart failure (HCC)  [place]. s/p MDT single chamber ICD    Diabetes mellitus    does not check blood sugars at home   Diverticulosis    Encounter for chronic pain management 05/17/2014   Indication for chronic opioid: severe arthritis RIGHTknee / low back Medication and dose: hydrocodone---uses prn # pills per month: gets #120 pills to last TWO months Last UDS date: 05/17/2014 Pain contract signed (Y/N): Y 05/17/2014 Date narcotic database last reviewed (  include red flags): 05/17/2014    Glaucoma    bilateral   Glaucoma 11/07/2015   Severe See Dr. Katy Fitch   H/O TIA (transient ischemic attack) and stroke    Hyperlipidemia    Hyperlipidemia with target low density lipoprotein (LDL) cholesterol less than 100 mg/dL 08/20/2011   Hypertension    HYPERTENSION, BENIGN 02/18/2007   Qualifier: Diagnosis of  By: Nori Riis MD, Drema Pry of both feet 04/12/2020   NICM (nonischemic cardiomyopathy) ([place]) 05/30/2015   Obesity    OBESITY, MORBID 05/21/2010   Qualifier: Diagnosis of  By: Caryl Comes, MD, Remus Blake    Obstructive sleep apnea 03/27/2009   Qualifier: Diagnosis of  By: Unk Lightning, RN, BSN, Melanie     Paroxysmal atrial fibrillation  ([place]) 09/21/2019   Paroxysmal VT ([place]) 06/22/2019   RENAL DISEASE, CHRONIC, STAGE III 02/18/2007   SHOULDER PAIN, LEFT 08/01/2009   History of left shoulder surgery.     Single implantable cardioverter-defibrillator (ICD) in situ 06/17/2011   Type 2 diabetes mellitus with stage 4 chronic kidney disease, without long-term current use of insulin ([place]) 08/25/2007    Current Outpatient Medications on File Prior to Visit  Medication Sig Dispense Refill   allopurinol (ZYLOPRIM) 100 MG tablet Take 0.5 tablets (50 mg total) by mouth daily. 45 tablet 3   blood glucose meter kit and supplies Dispense based on patient and insurance preference. Use up to four times daily as directed. (FOR ICD-10 E10.9, E11.9). 1 each 0   Blood Glucose Monitoring Suppl (ONETOUCH VERIO) w/Device KIT 1 kit by Does not apply route as directed. Use up to four times daily as directed. (FOR ICD-10 E10.9, E11.9). 1 kit 3   brimonidine (ALPHAGAN) 0.2 % ophthalmic solution Place 1 drop into both eyes 2 (two) times daily.     carvedilol (COREG) 25 MG tablet Take 1 tablet (25 mg total) by mouth 2 (two) times daily with a meal.     cetirizine (ZYRTEC ALLERGY) 10 MG tablet Take 1 tablet (10 mg total) by mouth daily. 30 tablet 6   cyclobenzaprine (FLEXERIL) 5 MG tablet Take 1 tablet (5 mg total) by mouth at bedtime. 30 tablet 1   diclofenac Sodium (VOLTAREN) 1 % GEL Apply 2 g topically 4 (four) times daily. 100 g 0   dorzolamide-timolol (COSOPT) 22.3-6.8 MG/ML ophthalmic solution Place 1 drop into both eyes 2 (two) times daily.     ELIQUIS 5 MG TABS tablet Take 1 tablet (5 mg total) by mouth 2 (two) times daily. 180 tablet 0   EPINEPHRINE 0.3 mg/0.3 mL IJ SOAJ injection Inject 0.3 mg into the muscle as needed for anaphylaxis. 2 each 1   fluticasone (FLONASE) 50 MCG/ACT nasal spray Place 2 sprays into both nostrils daily. 16 g 2   fluticasone (FLOVENT HFA) 110 MCG/ACT inhaler Inhale 2 puffs into the lungs 2 (two) times daily. 1 each 12    furosemide (LASIX) 40 MG tablet Take 1 tablet (40 mg total) by mouth as directed. 90 tablet 0   glimepiride (AMARYL) 4 MG tablet Take 4 mg by mouth daily.     glucose blood (ONETOUCH VERIO) test strip Use as instructed. Use up to four times daily as directed. (FOR ICD-10 E10.9, E11.9). 100 each 12   HYDROcodone-acetaminophen (NORCO/VICODIN) 5-325 MG tablet Take 2 in AM one at mid day and 2 at bedtime prn knee pain 150 tablet 0   insulin glargine (LANTUS SOLOSTAR) 100 UNIT/ML Solostar Pen Inject 25 units into skin  once daily 27 mL 3   ipratropium (ATROVENT) 0.03 % nasal spray Place 2 sprays into both nostrils every 12 (twelve) hours. 30 mL 12   isosorbide mononitrate (ISMO) 20 MG tablet TAKE ONE TABLET BY MOUTH TWICE DAILY 180 tablet 3   latanoprost (XALATAN) 0.005 % ophthalmic solution Place 1 drop into both eyes every evening.     NARCAN 4 MG/0.1ML LIQD nasal spray kit      nitroGLYCERIN (NITROSTAT) 0.4 MG SL tablet Place 0.4 mg under the tongue as needed.     NOVOLOG 100 UNIT/ML injection SMARTSIG:5 Unit(s) SUB-Q 3 Times Daily     rosuvastatin (CRESTOR) 10 MG tablet Take 1 tablet (10 mg total) by mouth daily. Replaces lipitor 90 tablet 3   sacubitril-valsartan (ENTRESTO) 49-51 MG Take 1 tablet by mouth 2 (two) times daily.     spironolactone (ALDACTONE) 25 MG tablet Take 25 mg by mouth 2 (two) times daily.      SURE COMFORT PEN NEEDLES 32G X 6 MM MISC 1 Units by Does not apply route in the morning, at noon, in the evening, and at bedtime. 100 each 1   TRUEPLUS INSULIN SYRINGE 31G X 5/16" 0.3 ML MISC      [DISCONTINUED] lisinopril (PRINIVIL,ZESTRIL) 40 MG tablet Take 40 mg by mouth 2 (two) times daily.       No current facility-administered medications on file prior to visit.    Allergies  Allergen Reactions   Bee Pollen Anaphylaxis, Itching and Swelling   Nsaids     CKD and CHF     Assessment/Plan:  1. CHF -  LVEF 40-45% on 12/2020, down from 55-60% on 09/2019. Will start Farxiga 70m  daily for CHF (eGFR has been  > 25 consistently). This will also provide CV, DM, and CKD benefit. Will continue carvedilol 268mBID, Entresto 49-5139mID, and spironolactone 56m48mD. Hesitant to increase dose of Entresto with his CKD and prior trend of K ~5. Pt sees Dr HarwTerrence Dupontt month and will have BMET checked then. F/u with PharmD as needed.  Will also forward note to PCP - pt mentioned that he was advised to stop his mealtime Novolog although this is still on his med list and I don't see recent mention of this being discontinued. Also forwarding as an FYI that pt is starting on Farxiga. I called his pharmacy since pt was unsure which prescription insurance he has; his FarxWilder Glade free so copays with branded meds should not be an issue (he reports Entresto and Eliquis are affordable as well).  Chyann Ambrocio E. Jane Broughton, PharmD, BCACP, CPP [id number] [id number]: [phone]: [phone]/2022 10:03 AM

## 2021-01-14 ENCOUNTER — Other Ambulatory Visit: Payer: Self-pay | Admitting: *Deleted

## 2021-01-14 ENCOUNTER — Other Ambulatory Visit: Payer: Self-pay | Admitting: Family Medicine

## 2021-01-14 DIAGNOSIS — G8929 Other chronic pain: Secondary | ICD-10-CM

## 2021-01-14 DIAGNOSIS — M25569 Pain in unspecified knee: Secondary | ICD-10-CM

## 2021-01-14 DIAGNOSIS — M170 Bilateral primary osteoarthritis of knee: Secondary | ICD-10-CM

## 2021-01-21 DIAGNOSIS — I5020 Unspecified systolic (congestive) heart failure: Secondary | ICD-10-CM | POA: Diagnosis not present

## 2021-01-21 DIAGNOSIS — R55 Syncope and collapse: Secondary | ICD-10-CM | POA: Diagnosis not present

## 2021-01-21 DIAGNOSIS — E119 Type 2 diabetes mellitus without complications: Secondary | ICD-10-CM | POA: Diagnosis not present

## 2021-01-21 DIAGNOSIS — I10 Essential (primary) hypertension: Secondary | ICD-10-CM | POA: Diagnosis not present

## 2021-01-21 DIAGNOSIS — I420 Dilated cardiomyopathy: Secondary | ICD-10-CM | POA: Diagnosis not present

## 2021-01-21 DIAGNOSIS — E785 Hyperlipidemia, unspecified: Secondary | ICD-10-CM | POA: Diagnosis not present

## 2021-01-28 ENCOUNTER — Ambulatory Visit (INDEPENDENT_AMBULATORY_CARE_PROVIDER_SITE_OTHER): Payer: Medicare Other

## 2021-01-28 DIAGNOSIS — I10 Essential (primary) hypertension: Secondary | ICD-10-CM | POA: Diagnosis not present

## 2021-01-28 DIAGNOSIS — Z95810 Presence of automatic (implantable) cardiac defibrillator: Secondary | ICD-10-CM | POA: Diagnosis not present

## 2021-01-28 DIAGNOSIS — N189 Chronic kidney disease, unspecified: Secondary | ICD-10-CM | POA: Diagnosis not present

## 2021-01-28 DIAGNOSIS — I5022 Chronic systolic (congestive) heart failure: Secondary | ICD-10-CM | POA: Diagnosis not present

## 2021-01-29 ENCOUNTER — Other Ambulatory Visit: Payer: Self-pay | Admitting: Family Medicine

## 2021-01-29 NOTE — Progress Notes (Signed)
EPIC Encounter for ICM Monitoring  Patient Name: [name] is a 60 y.o. male Date: 01/29/2021 Primary Care Physican: Dickie La, MD Primary Cardiologist: Terrence Dupont  Electrophysiologist: Caryl Comes 11/15/2020 Weight: 318 lbs     Spoke with wife and heart failure questions reviewed.  Pt asymptomatic for fluid accumulation and feeling well.   She reports [name] is concerned over low kidney function and placed Spironolactone on hold for 2 weeks and labs were redrawn this week.  She is waiting on lab results.     Optivol thoracic impedance suggesting normal fluid levels.   Prescribed:  Furosemide 40 mg Take 1 tablet by mouth as directed.  He takes PRN. Farxiga 10 mg take 1 tablet daily    Labs: 12/03/2020 Creatinine 1.75, BUN 21, Potassium 4.7, Sodium 140, GFR 43 11/15/2020 Creatinine 2.09, BUN 35, Potassium 5.2, Sodium 142, GFR 35  08/22/2020 Creatinine 1.53, BUN 19, Potassium 5.1, Sodium 144 05/28/2020 Creatinine 1.52, BUN 9,   Potassium 4.1, Sodium 141, GFR 49-56 A complete set of results can be found in Results Review.   Recommendations:  No changes and encouraged to call if experiencing any fluid symptoms.   Follow-up plan: ICM clinic phone appointment on 03/11/2021.   91 day device clinic remote transmission 03/14/2021.           EP/Cardiology Office Visits:  05/20/2020 with Dr Caryl Comes.   Copy of ICM check sent to Dr. Caryl Comes.     3 month ICM trend: 01/28/2021.    1 Year ICM trend:       Rosalene Billings, RN 01/29/2021 2:40 PM

## 2021-02-13 ENCOUNTER — Ambulatory Visit (INDEPENDENT_AMBULATORY_CARE_PROVIDER_SITE_OTHER): Payer: Medicare Other | Admitting: Family Medicine

## 2021-02-13 ENCOUNTER — Ambulatory Visit (INDEPENDENT_AMBULATORY_CARE_PROVIDER_SITE_OTHER): Payer: Medicare Other

## 2021-02-13 ENCOUNTER — Encounter: Payer: Self-pay | Admitting: Family Medicine

## 2021-02-13 ENCOUNTER — Other Ambulatory Visit: Payer: Self-pay

## 2021-02-13 VITALS — BP 124/67 | HR 69 | Ht 67.0 in | Wt 314.0 lb

## 2021-02-13 DIAGNOSIS — Z23 Encounter for immunization: Secondary | ICD-10-CM

## 2021-02-13 DIAGNOSIS — M25569 Pain in unspecified knee: Secondary | ICD-10-CM

## 2021-02-13 DIAGNOSIS — N1831 Chronic kidney disease, stage 3a: Secondary | ICD-10-CM | POA: Diagnosis not present

## 2021-02-13 DIAGNOSIS — E1122 Type 2 diabetes mellitus with diabetic chronic kidney disease: Secondary | ICD-10-CM

## 2021-02-13 DIAGNOSIS — M170 Bilateral primary osteoarthritis of knee: Secondary | ICD-10-CM

## 2021-02-13 DIAGNOSIS — I428 Other cardiomyopathies: Secondary | ICD-10-CM

## 2021-02-13 DIAGNOSIS — I480 Paroxysmal atrial fibrillation: Secondary | ICD-10-CM | POA: Diagnosis not present

## 2021-02-13 DIAGNOSIS — N184 Chronic kidney disease, stage 4 (severe): Secondary | ICD-10-CM | POA: Diagnosis not present

## 2021-02-13 DIAGNOSIS — G8929 Other chronic pain: Secondary | ICD-10-CM

## 2021-02-13 LAB — POCT GLYCOSYLATED HEMOGLOBIN (HGB A1C): HbA1c, POC (controlled diabetic range): 7 % (ref 0.0–7.0)

## 2021-02-13 MED ORDER — HYDROCODONE-ACETAMINOPHEN 5-325 MG PO TABS: ORAL_TABLET | ORAL | 0 refills | Status: DC

## 2021-02-13 NOTE — Patient Instructions (Signed)
Her blood sugar control is much better.  I think the new medicine, the Wilder Glade, is helping your heart and your blood sugar.  Continue to be careful with what you eat and get the exercise you can.  I understand the knee is keeping you from doing a lot of walking.  I will send you a note about your blood work today.  Mostly I am rechecking your electrolytes and your kidney function.  I called in your pain medicine today.  Let me know next month when you are about to run out.  I will send you a note about your lab work.  Your A1c is much better and we will recheck that 3 months from now.  I will see you in 3 months unless something comes up before, and if that happens then please make an appointment at your convenience.  Have a happy holiday season!

## 2021-02-13 NOTE — Assessment & Plan Note (Signed)
This can be really difficult for him to lose any significant weight right now because he is unable to do really any exercise secondary to knee pain.  Currently he is not a good surgical candidate given his obesity and his other multiple comorbidities.  Not sure what options we have.  He cannot afford exercise bicycle etc.

## 2021-02-13 NOTE — Assessment & Plan Note (Signed)
Recheck his creatinine today.

## 2021-02-13 NOTE — Assessment & Plan Note (Signed)
A1c is somewhat better today.  I think the Wilder Glade may be helping as well.  We will continue current medication regimen and follow-up 3 months.

## 2021-02-13 NOTE — Progress Notes (Signed)
    CHIEF COMPLAINT / HPI: #1.  Follow-up diabetes mellitus.  He is not checking his sugars but he is trying to eat pretty well.  Sometimes finances limit that.  He is not able to do any exercise right now because of his right knee issues.  Really cannot walk very far at all without significant pain. 2.  Chronic kidney disease: He is here for recheck of his blood work regarding that today. 3.  Follow-up cardiovascular disease .  They recently added [place] and he is having no problems with that.   PERTINENT  PMH / PSH: I have reviewed the patient's medications, allergies, past medical and surgical history, smoking status and updated in the EMR as appropriate.   OBJECTIVE:  BP 124/67   Pulse 69   Ht 5\' 7"  (1.702 m)   Wt (!) 314 lb (142.4 kg)   SpO2 99%   BMI 49.18 kg/m  Vital signs reviewed.  BMI 49 GENERAL: Well-developed, overweight, well-nourished, no acute distress. CARDIOVASCULAR: Irregularly irregular.  No murmur. LUNGS: Clear to auscultation bilaterally, no rales or wheeze. ABDOMEN: Soft positive bowel sounds.  Abdomen is obese.  No masses are noted although this is somewhat limited in exam by his habitus. NEURO: Blind.  He walks with a cane.  No other gross focal neurological deficits. MSK: Movement of extremity x 4.  Right knee with synovial hypertrophy and medial and lateral joint line tenderness to palpation.  Pain with weightbearing.   ASSESSMENT / PLAN:  Health maintenance: Flu shot and his COVID booster given today. RENAL DISEASE, CHRONIC, STAGE III Recheck his creatinine today.  Type 2 diabetes mellitus with stage 4 chronic kidney disease, without long-term current use of insulin (HCC) A1c is somewhat better today.  I think the Wilder Glade may be helping as well.  We will continue current medication regimen and follow-up 3 months.  OBESITY, MORBID This can be really difficult for him to lose any significant weight right now because he is unable to do really any  exercise secondary to knee pain.  Currently he is not a good surgical candidate given his obesity and his other multiple comorbidities.  Not sure what options we have.  He cannot afford exercise bicycle etc.  Paroxysmal atrial fibrillation ([place]) Continues on chronic anticoagulation.   [name] MD

## 2021-02-13 NOTE — Assessment & Plan Note (Signed)
Continues on chronic anticoagulation.

## 2021-02-14 ENCOUNTER — Encounter: Payer: Self-pay | Admitting: Family Medicine

## 2021-02-14 LAB — BASIC METABOLIC PANEL
BUN/Creatinine Ratio: 5 — ABNORMAL LOW (ref 10–24)
BUN: 8 mg/dL (ref 8–27)
CO2: 25 mmol/L (ref 20–29)
Calcium: 9 mg/dL (ref 8.6–10.2)
Chloride: 111 mmol/L — ABNORMAL HIGH (ref 96–106)
Creatinine, Ser: 1.49 mg/dL — ABNORMAL HIGH (ref 0.76–1.27)
Glucose: 162 mg/dL — ABNORMAL HIGH (ref 70–99)
Potassium: 5 mmol/L (ref 3.5–5.2)
Sodium: 145 mmol/L — ABNORMAL HIGH (ref 134–144)
eGFR: 53 mL/min/{1.73_m2} — ABNORMAL LOW (ref >59)

## 2021-02-15 ENCOUNTER — Other Ambulatory Visit: Payer: Self-pay | Admitting: Family Medicine

## 2021-02-15 DIAGNOSIS — S39012A Strain of muscle, fascia and tendon of lower back, initial encounter: Secondary | ICD-10-CM

## 2021-02-26 ENCOUNTER — Other Ambulatory Visit: Payer: Self-pay | Admitting: Family Medicine

## 2021-03-11 ENCOUNTER — Ambulatory Visit (INDEPENDENT_AMBULATORY_CARE_PROVIDER_SITE_OTHER): Payer: Medicare Other

## 2021-03-11 ENCOUNTER — Telehealth: Payer: Self-pay

## 2021-03-11 DIAGNOSIS — I5022 Chronic systolic (congestive) heart failure: Secondary | ICD-10-CM

## 2021-03-11 DIAGNOSIS — Z95810 Presence of automatic (implantable) cardiac defibrillator: Secondary | ICD-10-CM

## 2021-03-11 NOTE — Progress Notes (Signed)
Spoke with wife and transmission reviewed.  She stated he has been drinking too much fluids which may be causing fluid accumulation.  Advised to limit fluid intake to 64 oz daily.  Advised to take PRN Furosemide 40 mg 1 tablet x 2 days and will recheck fluid levels 12/8.  She verbalized understanding.

## 2021-03-11 NOTE — Progress Notes (Addendum)
EPIC Encounter for ICM Monitoring  Patient Name: [name] is a 60 y.o. male Date: 03/11/2021 Primary Care Physican: Dickie La, MD Primary Cardiologist: Terrence Dupont  Electrophysiologist: Caryl Comes 11/15/2020 Weight: 318 lbs     Attempted call to patient/wife and unable to reach.  Left message to return call. Transmission reviewed.    Optivol thoracic impedance suggesting possible fluid accumulation starting 11/24.    Prescribed:  Furosemide 40 mg Take 1 tablet by mouth as directed.  He takes PRN. Farxiga 10 mg take 1 tablet daily    Labs: 02/13/2021 Creatinine 1.49, BUN 8,   Potassium 5.0, Sodium 145, GFR 53 01/28/2021 Creatinine 1.86, BUN 24, Potassium 4.1, Sodium 141, GFR 40 12/03/2020 Creatinine 1.75, BUN 21, Potassium 4.7, Sodium 140, GFR 43 11/15/2020 Creatinine 2.09, BUN 35, Potassium 5.2, Sodium 142, GFR 35  08/22/2020 Creatinine 1.53, BUN 19, Potassium 5.1, Sodium 144 05/28/2020 Creatinine 1.52, BUN 9,   Potassium 4.1, Sodium 141, GFR 49-56 A complete set of results can be found in Results Review.   Recommendations:  Unable to reach.  Will advise to take PRN Furosemide x 2 days if patient is reached.    Follow-up plan: ICM clinic phone appointment on 03/14/2021 to recheck fluid levels.   91 day device clinic remote transmission 03/14/2021.           EP/Cardiology Office Visits:  05/20/2020 with Dr Caryl Comes.   Copy of ICM check sent to Dr. Caryl Comes and [name].      3 month ICM trend: 03/11/2021.    12-14 Month ICM trend:       Rosalene Billings, RN 03/11/2021 1:43 PM

## 2021-03-11 NOTE — Telephone Encounter (Signed)
Remote ICM transmission received.  Attempted call to patient regarding ICM remote transmission and left message per DPR to return call.   

## 2021-03-11 NOTE — Progress Notes (Signed)
Attempted to return wife's call and left message.

## 2021-03-14 ENCOUNTER — Ambulatory Visit (INDEPENDENT_AMBULATORY_CARE_PROVIDER_SITE_OTHER): Payer: Medicare Other

## 2021-03-14 DIAGNOSIS — I5022 Chronic systolic (congestive) heart failure: Secondary | ICD-10-CM | POA: Diagnosis not present

## 2021-03-14 LAB — CUP PACEART REMOTE DEVICE CHECK
Battery Remaining Longevity: 71 mo
Battery Voltage: 2.92 V
Brady Statistic RV Percent Paced: 0.01 %
Date Time Interrogation Session: 20221208012304
HighPow Impedance: 47 Ohm
HighPow Impedance: 58 Ohm
Implantable Lead Implant Date: 20081114
Implantable Lead Location: 753860
Implantable Lead Model: 6947
Implantable Pulse Generator Implant Date: 20170222
Lead Channel Impedance Value: 380 Ohm
Lead Channel Impedance Value: 437 Ohm
Lead Channel Pacing Threshold Amplitude: 0.625 V
Lead Channel Pacing Threshold Pulse Width: 0.4 ms
Lead Channel Sensing Intrinsic Amplitude: 7 mV
Lead Channel Sensing Intrinsic Amplitude: 7 mV
Lead Channel Setting Pacing Amplitude: 2.5 V
Lead Channel Setting Pacing Pulse Width: 0.4 ms
Lead Channel Setting Sensing Sensitivity: 0.3 mV

## 2021-03-15 ENCOUNTER — Other Ambulatory Visit: Payer: Self-pay | Admitting: Family Medicine

## 2021-03-15 ENCOUNTER — Ambulatory Visit: Payer: Medicare Other

## 2021-03-15 ENCOUNTER — Other Ambulatory Visit: Payer: Self-pay

## 2021-03-15 DIAGNOSIS — M170 Bilateral primary osteoarthritis of knee: Secondary | ICD-10-CM

## 2021-03-15 DIAGNOSIS — G8929 Other chronic pain: Secondary | ICD-10-CM

## 2021-03-15 DIAGNOSIS — Z95810 Presence of automatic (implantable) cardiac defibrillator: Secondary | ICD-10-CM

## 2021-03-15 DIAGNOSIS — M25569 Pain in unspecified knee: Secondary | ICD-10-CM

## 2021-03-15 DIAGNOSIS — I5022 Chronic systolic (congestive) heart failure: Secondary | ICD-10-CM

## 2021-03-15 NOTE — Telephone Encounter (Signed)
Opened in error.   Cathie Bonnell C Payson Crumby, RN  

## 2021-03-15 NOTE — Progress Notes (Signed)
EPIC Encounter for ICM Monitoring  Patient Name: [name] is a 60 y.o. male Date: 03/15/2021 Primary Care Physican: Dickie La, MD Primary Cardiologist: Terrence Dupont  Electrophysiologist: Caryl Comes 11/15/2020 Weight: 318 lbs     Spoke with wife per DPR and heart failure questions reviewed.  Pt asymptomatic for fluid accumulation and feeling well.   Optivol thoracic impedance suggesting fluid levels returned to normal after taking PRN Furosemide x 2 days    Prescribed:  Furosemide 40 mg Take 1 tablet by mouth as directed.  He takes PRN. Farxiga 10 mg take 1 tablet daily    Labs: 02/13/2021 Creatinine 1.49, BUN 8,   Potassium 5.0, Sodium 145, GFR 53 01/28/2021 Creatinine 1.86, BUN 24, Potassium 4.1, Sodium 141, GFR 40 12/03/2020 Creatinine 1.75, BUN 21, Potassium 4.7, Sodium 140, GFR 43 11/15/2020 Creatinine 2.09, BUN 35, Potassium 5.2, Sodium 142, GFR 35  08/22/2020 Creatinine 1.53, BUN 19, Potassium 5.1, Sodium 144 05/28/2020 Creatinine 1.52, BUN 9,   Potassium 4.1, Sodium 141, GFR 49-56 A complete set of results can be found in Results Review.   Recommendations:  Recommendation to limit salt intake to 2000 mg daily and fluid intake to 64 oz daily.  Encouraged to call if experiencing any fluid symptoms.    Follow-up plan: ICM clinic phone appointment on 04/15/2021.   91 day device clinic remote transmission 06/13/2021.           EP/Cardiology Office Visits:  05/20/2020 with Dr Caryl Comes.   Copy of ICM check sent to Dr. Caryl Comes.  3 month ICM trend: 03/15/2021.    12-14 Month ICM trend:       Rosalene Billings, RN 03/15/2021 2:30 PM

## 2021-03-21 ENCOUNTER — Ambulatory Visit (INDEPENDENT_AMBULATORY_CARE_PROVIDER_SITE_OTHER): Payer: Medicare Other | Admitting: Family Medicine

## 2021-03-21 ENCOUNTER — Other Ambulatory Visit: Payer: Self-pay

## 2021-03-21 VITALS — BP 171/104 | HR 66 | Ht 67.0 in | Wt 310.4 lb

## 2021-03-21 DIAGNOSIS — M25511 Pain in right shoulder: Secondary | ICD-10-CM

## 2021-03-21 DIAGNOSIS — N4889 Other specified disorders of penis: Secondary | ICD-10-CM

## 2021-03-21 MED ORDER — FLUCONAZOLE 50 MG PO TABS: 50.0000 mg | ORAL_TABLET | Freq: Once | ORAL | 0 refills | Status: AC

## 2021-03-21 NOTE — Patient Instructions (Signed)
For your back pain, Take 1000 mg Tylenol twice a day, take msucle relaxer (Flexeril) at bedtime for the next week. Be sure to stop by the pharmacy to pick up Lidocaine patches. Apply for 12 hours and then remove.   As discussed, get a frozen water bottle and rub the area while lean against the wall (as demonstrated). Perform shoulder exercises to release the muscle tension.   For your penile itching, take the Diflucan daily for the next 7 days.   Take Care,  Dr. Susa Simmonds

## 2021-03-21 NOTE — Progress Notes (Signed)
° °  SUBJECTIVE:   CHIEF COMPLAINT / HPI:   Chief Complaint  Patient presents with   Shoulder Pain     [name] is a 60 y.o. male here for right posterior shoulder pain for the past two weeks.  Notes pain is worse with deep breathing and better with rest. Denies shortness of breath and chest pain.  Pain is intermittent but can last 2-3 days. He has taken Flexeril which helped.    Pt reports penile itching for the past week. Has had similar sx in the past that resolved with treatment. Denies dysuria or sexual activity.    PERTINENT  PMH / PSH: reviewed and updated as appropriate   OBJECTIVE:   BP (!) 171/104    Pulse 66    Ht 5\' 7"  (1.702 m)    Wt (!) 310 lb 6.4 oz (140.8 kg)    SpO2 100%    BMI 48.62 kg/m    GEN: pleasant well appearing elderly male, in no acute distress  CV: regular rate and rhythm RESP: no increased work of breathing, clear to ascultation bilaterally  MSK: shoulder good ROM, hypertonicity of the upper and lateral trapezius on the left, painful tender points noted, normal shoulder strength, radial pulse intact, gross sensation intact   GU: no testicular tenderness, white discharge with erythematous patches near glans with retraction  SKIN: warm, dry, as above     ASSESSMENT/PLAN:   Right Shoulder Pain  Note posterior right shoulder pain for 2 weeks. Has significant hypertonicity and several trapezius tender points.  Suspect muscle strain it it improves with manual manipulation. Discussed home exercises and ice therapy rolls. He will wants to perform home exercises instead of formal physical therapy. Take flexril at bedtime for the next week. Continue Norco for known chronic pain.   Penile Candidiasis  Per chart review, hx of similar treated with diflucan. Treat with 50 mg Diflucan for  1 week.  Last a1c  7.0.    Lyndee Hensen, DO PGY-3, [facility] 03/21/2021

## 2021-03-22 NOTE — Progress Notes (Signed)
Remote ICD transmission.   

## 2021-04-11 ENCOUNTER — Other Ambulatory Visit: Payer: Self-pay | Admitting: Family Medicine

## 2021-04-11 DIAGNOSIS — M170 Bilateral primary osteoarthritis of knee: Secondary | ICD-10-CM

## 2021-04-11 DIAGNOSIS — G8929 Other chronic pain: Secondary | ICD-10-CM

## 2021-04-11 DIAGNOSIS — M25569 Pain in unspecified knee: Secondary | ICD-10-CM

## 2021-04-15 DIAGNOSIS — E113293 Type 2 diabetes mellitus with mild nonproliferative diabetic retinopathy without macular edema, bilateral: Secondary | ICD-10-CM | POA: Diagnosis not present

## 2021-04-15 DIAGNOSIS — H401133 Primary open-angle glaucoma, bilateral, severe stage: Secondary | ICD-10-CM | POA: Diagnosis not present

## 2021-04-18 ENCOUNTER — Telehealth: Payer: Self-pay

## 2021-04-18 NOTE — Telephone Encounter (Signed)
LMOVM for patient to send missed ICM transmission. 

## 2021-04-18 NOTE — Telephone Encounter (Signed)
Patient sister called in stating patient lives alone now and he is blind so there is no way he can send a transmission unless someone is there to assist him. She states someone should be there in a few days and can make sure that is done when they are there with him.

## 2021-04-19 ENCOUNTER — Telehealth: Payer: Self-pay

## 2021-04-19 NOTE — Telephone Encounter (Signed)
Returned call to wife, Aldona Bar per [employer] as requested by voice mail.  She reports patient is having swelling of his legs and asked if the remote transmission shows fluid.  Advised automatic report was not received on scheduled date of 1/9 and do not have any information on fluid levels.  Advised the monitor needs to be at bedside preferably nightstand where he sleeps in order for it to be done automatically. Pt now lives alone and is blind so he is not able to send manual remote transmissions.  Advised to call Dr Zenia Resides office today to report leg swelling. Pt has office visit on Monday, 1/16 with [name].  Advised if pt feels symptoms cannot wait until Monday to call the office today or use ER if urgent.  She will have patient's daughter or sister move the monitor to his bedside for automatic transmissions.  [facility] shows earliest next automatic transmission will be after 1/28. Automatic ICM remote transmission scheduled for 1/31.

## 2021-04-22 ENCOUNTER — Ambulatory Visit (INDEPENDENT_AMBULATORY_CARE_PROVIDER_SITE_OTHER): Payer: Medicare Other

## 2021-04-22 DIAGNOSIS — Z95810 Presence of automatic (implantable) cardiac defibrillator: Secondary | ICD-10-CM | POA: Diagnosis not present

## 2021-04-22 DIAGNOSIS — R55 Syncope and collapse: Secondary | ICD-10-CM | POA: Diagnosis not present

## 2021-04-22 DIAGNOSIS — I420 Dilated cardiomyopathy: Secondary | ICD-10-CM | POA: Diagnosis not present

## 2021-04-22 DIAGNOSIS — I5022 Chronic systolic (congestive) heart failure: Secondary | ICD-10-CM | POA: Diagnosis not present

## 2021-04-22 DIAGNOSIS — N189 Chronic kidney disease, unspecified: Secondary | ICD-10-CM | POA: Diagnosis not present

## 2021-04-22 DIAGNOSIS — G4733 Obstructive sleep apnea (adult) (pediatric): Secondary | ICD-10-CM | POA: Diagnosis not present

## 2021-04-22 DIAGNOSIS — E1169 Type 2 diabetes mellitus with other specified complication: Secondary | ICD-10-CM | POA: Diagnosis not present

## 2021-04-22 DIAGNOSIS — I5020 Unspecified systolic (congestive) heart failure: Secondary | ICD-10-CM | POA: Diagnosis not present

## 2021-04-22 NOTE — Progress Notes (Signed)
EPIC Encounter for ICM Monitoring  Patient Name: [name] is a 61 y.o. male Date: 04/22/2021 Primary Care Physican: Dickie La, MD Primary Cardiologist: Terrence Dupont  Electrophysiologist: Caryl Comes 11/15/2020 Weight: 318 lbs     Spoke with wife per DPR and heart failure questions reviewed.  Pt did have some leg swelling last week but has resolved after taking PRN Furosemide. Pt lives alone and is blind.    Optivol thoracic impedance trending just below baseline normal with possible minimal fluid accumulation.   Prescribed:  Furosemide 40 mg Take 1 tablet by mouth as directed.  He takes PRN. Farxiga 10 mg take 1 tablet daily    Labs: 02/13/2021 Creatinine 1.49, BUN 8,   Potassium 5.0, Sodium 145, GFR 53 01/28/2021 Creatinine 1.86, BUN 24, Potassium 4.1, Sodium 141, GFR 40 12/03/2020 Creatinine 1.75, BUN 21, Potassium 4.7, Sodium 140, GFR 43 11/15/2020 Creatinine 2.09, BUN 35, Potassium 5.2, Sodium 142, GFR 35  08/22/2020 Creatinine 1.53, BUN 19, Potassium 5.1, Sodium 144 05/28/2020 Creatinine 1.52, BUN 9,   Potassium 4.1, Sodium 141, GFR 49-56 A complete set of results can be found in Results Review.   Recommendations:  Wife will advise patient to take 1 tablet Furosemide if patient is still having leg swelling.   Encouraged to call if experiencing any fluid symptoms.    Follow-up plan: ICM clinic phone appointment on 05/27/2021.   91 day device clinic remote transmission 06/13/2021.           EP/Cardiology Office Visits:  05/20/2020 with Dr Caryl Comes and [name], as Juluis Rainier.   Copy of ICM check sent to Dr. Caryl Comes.  3 month ICM trend: 04/22/2021.    12-14 Month ICM trend:     Rosalene Billings, RN 04/22/2021 10:03 AM

## 2021-04-24 ENCOUNTER — Other Ambulatory Visit: Payer: Self-pay | Admitting: Family Medicine

## 2021-04-24 DIAGNOSIS — S39012A Strain of muscle, fascia and tendon of lower back, initial encounter: Secondary | ICD-10-CM

## 2021-04-26 ENCOUNTER — Other Ambulatory Visit: Payer: Self-pay | Admitting: Internal Medicine

## 2021-04-26 NOTE — Telephone Encounter (Signed)
Prescription refill request for Eliquis received. Indication:Afib Last office visit:9/22 Scr:1.4 Age: 61 Weight:140.8 kg  Prescription refilled

## 2021-05-13 ENCOUNTER — Other Ambulatory Visit: Payer: Self-pay

## 2021-05-13 ENCOUNTER — Other Ambulatory Visit: Payer: Self-pay | Admitting: Family Medicine

## 2021-05-13 DIAGNOSIS — G8929 Other chronic pain: Secondary | ICD-10-CM

## 2021-05-13 DIAGNOSIS — M170 Bilateral primary osteoarthritis of knee: Secondary | ICD-10-CM

## 2021-05-13 DIAGNOSIS — M25569 Pain in unspecified knee: Secondary | ICD-10-CM

## 2021-05-13 MED ORDER — HYDROCODONE-ACETAMINOPHEN 5-325 MG PO TABS: ORAL_TABLET | ORAL | 0 refills | Status: DC

## 2021-05-14 ENCOUNTER — Other Ambulatory Visit: Payer: Self-pay | Admitting: Family Medicine

## 2021-05-19 NOTE — Progress Notes (Signed)
Patient Care Team: Dickie La, MD as PCP - General [name], MD as PCP - Electrophysiology (Cardiology) [name], MD as Consulting Physician (Ophthalmology)   HPI  [name] is a 61 y.o. male seen in followup for an Medtronic ICD implanted 2008, gen change 2017 for primary preventionin the setting of nonischemic heart disease and congestive heart failure.   Also paroxysmal atrial fibrillation.  Anticoagulated with apixaban.  No bleeding  The patient denies chest pain,   There have been no palpitations, lightheadedness or syncope.  Complains of chronic stable dyspnea.  Occasional nocturnal dyspnea that may track with fluid.  He has intermittent edema with prompting to take his furosemide as needed.  Blind 2/2 glaucoma; lives by himself.  Sister and brother help him with his meals and shopping.  All of his brothers are blind from glaucoma.  DATE TEST EF   10/08 Echo   25-30% %   1/17 Echo 40-45%   8/22 Myoview  44% Inferolateral fixed defect  9/22  Echo  40-45%    Date Cr K Hgb  8/22 2.09 5.2 14.5  11/22 1.49 5.0       Past Medical History:  Diagnosis Date   Arthritis    Arthritis of knee, degenerative 10/03/2011   Right > LEFT KNEE oa X-rays were not standing but showed some mild sclerosis and joint space loss of the medial compartment of the right knee. History of arthroscopy right knee. Report of injury as a child but unclear exactly what that was.  :I am aware of 12/01/18 Rx by Dr. Nori Riis.  While wife was hospitalized, narcotic Rx went missing when in-home sitter left.  To my knowledge, this is the first mi   Automatic implantable cardioverter-defibrillator in situ 07/15/2013   Blind in both eyes 06/15/2013   Right Sees Dr Katy Fitch   Cataracts, bilateral    right   Chronic gout due to renal impairment of multiple sites without tophus 09/07/1306   Chronic systolic heart failure ([place]) 02/18/2007   Qualifier: Diagnosis of  By: Unk Lightning MD, Tivis Ringer     Congestive heart failure ([place])    a. s/p MDT single chamber ICD    Diabetes mellitus    does not check blood sugars at home   Diverticulosis    Encounter for chronic pain management 05/17/2014   Indication for chronic opioid: severe arthritis RIGHTknee / low back Medication and dose: hydrocodone---uses prn # pills per month: gets #120 pills to last TWO months Last UDS date: 05/17/2014 Pain contract signed (Y/N): Y 05/17/2014 Date narcotic database last reviewed (include red flags): 05/17/2014    Glaucoma    bilateral   Glaucoma 11/07/2015   Severe See Dr. Katy Fitch   H/O TIA (transient ischemic attack) and stroke    Hyperlipidemia    Hyperlipidemia with target low density lipoprotein (LDL) cholesterol less than 100 mg/dL 08/20/2011   Hypertension    HYPERTENSION, BENIGN 02/18/2007   Qualifier: Diagnosis of  By: Nori Riis MD, Drema Pry of both feet 04/12/2020   NICM (nonischemic cardiomyopathy) ([place]) 05/30/2015   Obesity    OBESITY, MORBID 05/21/2010   Qualifier: Diagnosis of  By: Caryl Comes, MD, Remus Blake    Obstructive sleep apnea 03/27/2009   Qualifier: Diagnosis of  By: Unk Lightning, RN, BSN, Melanie     Paroxysmal atrial fibrillation ([place]) 09/21/2019   Paroxysmal VT 06/22/2019   RENAL DISEASE, CHRONIC, STAGE III 02/18/2007   SHOULDER PAIN,  LEFT 08/01/2009   History of left shoulder surgery.     Single implantable cardioverter-defibrillator (ICD) in situ 06/17/2011   Type 2 diabetes mellitus with stage 4 chronic kidney disease, without long-term current use of insulin ([place]) 08/25/2007    Past Surgical History:  Procedure Laterality Date   CARDIAC CATHETERIZATION  10/24/2003   EF of 45-50%   CARDIAC DEFIBRILLATOR PLACEMENT  2008   CATARACT EXTRACTION  2012   right   EP IMPLANTABLE DEVICE N/A 05/30/2015   Procedure: ICD Generator Changeout;  Surgeon: [name], MD;  Location: [facility];  Service: Cardiovascular;  Laterality: N/A;   KNEE SURGERY Right 2000   SHOULDER  SURGERY  2012   left    Current Outpatient Medications  Medication Sig Dispense Refill   allopurinol (ZYLOPRIM) 100 MG tablet Take 0.5 tablets (50 mg total) by mouth daily. 45 tablet 3   blood glucose meter kit and supplies Dispense based on patient and insurance preference. Use up to four times daily as directed. (FOR ICD-10 E10.9, E11.9). 1 each 0   Blood Glucose Monitoring Suppl (ONETOUCH VERIO) w/Device KIT 1 kit by Does not apply route as directed. Use up to four times daily as directed. (FOR ICD-10 E10.9, E11.9). 1 kit 3   brimonidine (ALPHAGAN) 0.2 % ophthalmic solution Place 1 drop into both eyes 2 (two) times daily.     carvedilol (COREG) 25 MG tablet Take 1 tablet (25 mg total) by mouth 2 (two) times daily with a meal.     cetirizine (ZYRTEC ALLERGY) 10 MG tablet Take 1 tablet (10 mg total) by mouth daily. 30 tablet 6   cyclobenzaprine (FLEXERIL) 5 MG tablet Take 1 tablet (5 mg total) by mouth at bedtime. 30 tablet 1   dapagliflozin propanediol (FARXIGA) 10 MG TABS tablet Take 1 tablet (10 mg total) by mouth daily before breakfast. 90 tablet 3   diclofenac Sodium (VOLTAREN) 1 % GEL Apply 2 g topically 4 (four) times daily. 100 g 0   dorzolamide-timolol (COSOPT) 22.3-6.8 MG/ML ophthalmic solution Place 1 drop into both eyes 2 (two) times daily.     ELIQUIS 5 MG TABS tablet Take 1 tablet (5 mg total) by mouth 2 (two) times daily. 180 tablet 1   EPINEPHRINE 0.3 mg/0.3 mL IJ SOAJ injection Inject 0.3 mg into the muscle as needed for anaphylaxis. 2 each 1   fluticasone (FLONASE) 50 MCG/ACT nasal spray Place 2 sprays into both nostrils daily. 16 g 2   fluticasone (FLOVENT HFA) 110 MCG/ACT inhaler Inhale 2 puffs into the lungs 2 (two) times daily. 1 each 12   furosemide (LASIX) 40 MG tablet Take 1 tablet (40 mg total) by mouth as directed. 90 tablet 0   glimepiride (AMARYL) 4 MG tablet Take 4 mg by mouth daily.     glucose blood (ONETOUCH VERIO) test strip Use as instructed. Use up to four  times daily as directed. (FOR ICD-10 E10.9, E11.9). 100 each 12   HYDROcodone-acetaminophen (NORCO/VICODIN) 5-325 MG tablet Take 2 by mouth am and 1 mid day amd 2 PM as directed  prn pain 150 tablet 0   ipratropium (ATROVENT) 0.03 % nasal spray Place 2 sprays into both nostrils every 12 (twelve) hours. 30 mL 12   isosorbide mononitrate (ISMO) 20 MG tablet TAKE ONE TABLET BY MOUTH TWICE DAILY 180 tablet 3   LANTUS SOLOSTAR 100 UNIT/ML Solostar Pen Inject 25 units into skin once daily 27 mL 3   latanoprost (XALATAN) 0.005 % ophthalmic  solution Place 1 drop into both eyes every evening.     NARCAN 4 MG/0.1ML LIQD nasal spray kit      nitroGLYCERIN (NITROSTAT) 0.4 MG SL tablet Place 0.4 mg under the tongue as needed.     NOVOLOG 100 UNIT/ML injection      rosuvastatin (CRESTOR) 10 MG tablet Take 1 tablet (10 mg total) by mouth daily. Replaces lipitor 90 tablet 3   sacubitril-valsartan (ENTRESTO) 49-51 MG Take 1 tablet by mouth 2 (two) times daily.     spironolactone (ALDACTONE) 25 MG tablet Take 25 mg by mouth 2 (two) times daily.      SURE COMFORT PEN NEEDLES 32G X 6 MM MISC 1 Units by Does not apply route in the morning, at noon, in the evening, and at bedtime. 100 each 1   TRUEPLUS INSULIN SYRINGE 31G X 5/16" 0.3 ML MISC      No current facility-administered medications for this visit.    Allergies  Allergen Reactions   Bee Pollen Anaphylaxis, Itching and Swelling   Nsaids     CKD and CHF    Review of Systems negative except from HPI and PMH  Physical Exam    BP (!) 162/84    Pulse 64    Ht 5' 7"  (1.702 m)    Wt 300 lb (136.1 kg)    SpO2 97%    BMI 46.99 kg/m  Well developed and Morbidly obese in no acute distress unfortunately, he is wearing a [employer] toboggan HENT normal Neck supple Clear Device pocket well healed; without hematoma or erythema.  There is no tethering  Regular rate and rhythm, no  gallop No murmur Abd-soft with active BS No Clubbing cyanosis   edema Skin-warm and dry A & Oriented  Grossly normal sensory and motor function  ECG sinus at 64 Intervals 18/10/42   Assessment and  Plan:  Nonischemic cardiomyopathy   Hypertension   Morbid obesity  Implantable defibrillator-Medtronic     Atrial fibrillation-persistent  Blindness secondary to glaucoma  Blood pressure is reasonably controlled at 132/84.  In the context of his cardiomyopathy with improved LV function, we will continue his carvedilol at 25 twice daily, Entresto 49/51 and spironolactone 25.  He is euvolemic.  We will continue with Lasix 40 mg as needed.  Continue Wilder Glade for his heart failure; also for his diabetes.  Device function is normal.  Longevity 5.8 years.

## 2021-05-20 ENCOUNTER — Ambulatory Visit (INDEPENDENT_AMBULATORY_CARE_PROVIDER_SITE_OTHER): Payer: Medicare Other | Admitting: Internal Medicine

## 2021-05-20 ENCOUNTER — Other Ambulatory Visit: Payer: Self-pay

## 2021-05-20 ENCOUNTER — Encounter: Payer: Self-pay | Admitting: Internal Medicine

## 2021-05-20 VITALS — BP 132/84 | HR 64 | Ht 67.0 in | Wt 300.0 lb

## 2021-05-20 DIAGNOSIS — I5022 Chronic systolic (congestive) heart failure: Secondary | ICD-10-CM | POA: Diagnosis not present

## 2021-05-20 DIAGNOSIS — I480 Paroxysmal atrial fibrillation: Secondary | ICD-10-CM

## 2021-05-20 DIAGNOSIS — I428 Other cardiomyopathies: Secondary | ICD-10-CM | POA: Diagnosis not present

## 2021-05-20 DIAGNOSIS — Z95810 Presence of automatic (implantable) cardiac defibrillator: Secondary | ICD-10-CM | POA: Diagnosis not present

## 2021-05-20 NOTE — Patient Instructions (Addendum)
Medication Instructions:  Your physician recommends that you continue on your current medications as directed. Please refer to the Current Medication list given to you today.  *If you need a refill on your cardiac medications before your next appointment, please call your pharmacy*   Lab Work: Will need labs in May 2023  If you have labs (blood work) drawn today and your tests are completely normal, you will receive your results only by: [place] (if you have MyChart) OR A paper copy in the mail If you have any lab test that is abnormal or we need to change your treatment, we will call you to review the results.   Testing/Procedures: None ordered.    Follow-Up: At [facility], you and your health needs are our priority.  As part of our continuing mission to provide you with exceptional heart care, we have created designated Provider Care Teams.  These Care Teams include your primary Cardiologist (physician) and Advanced Practice Providers (APPs -  Physician Assistants and Nurse Practitioners) who all work together to provide you with the care you need, when you need it.  We recommend signing up for the patient portal called "MyChart".  Sign up information is provided on this After Visit Summary.  MyChart is used to connect with patients for Virtual Visits (Telemedicine).  Patients are able to view lab/test results, encounter notes, upcoming appointments, etc.  Non-urgent messages can be sent to your provider as well.   To learn more about what you can do with MyChart, go to [email or web address].    Your next appointment:   Aug 22, 2021 at 1030am with Tommye Standard, PA-C

## 2021-05-22 ENCOUNTER — Encounter: Payer: Self-pay | Admitting: Family Medicine

## 2021-05-22 ENCOUNTER — Ambulatory Visit (INDEPENDENT_AMBULATORY_CARE_PROVIDER_SITE_OTHER): Payer: Medicare Other | Admitting: Family Medicine

## 2021-05-22 ENCOUNTER — Other Ambulatory Visit: Payer: Self-pay

## 2021-05-22 VITALS — BP 124/86

## 2021-05-22 DIAGNOSIS — M170 Bilateral primary osteoarthritis of knee: Secondary | ICD-10-CM

## 2021-05-22 DIAGNOSIS — I10 Essential (primary) hypertension: Secondary | ICD-10-CM

## 2021-05-22 DIAGNOSIS — N1831 Chronic kidney disease, stage 3a: Secondary | ICD-10-CM | POA: Diagnosis not present

## 2021-05-22 DIAGNOSIS — E1122 Type 2 diabetes mellitus with diabetic chronic kidney disease: Secondary | ICD-10-CM

## 2021-05-22 DIAGNOSIS — Z125 Encounter for screening for malignant neoplasm of prostate: Secondary | ICD-10-CM | POA: Diagnosis not present

## 2021-05-22 DIAGNOSIS — N184 Chronic kidney disease, stage 4 (severe): Secondary | ICD-10-CM

## 2021-05-22 DIAGNOSIS — I5022 Chronic systolic (congestive) heart failure: Secondary | ICD-10-CM | POA: Diagnosis not present

## 2021-05-22 LAB — POCT GLYCOSYLATED HEMOGLOBIN (HGB A1C): HbA1c, POC (controlled diabetic range): 6.4 % (ref 0.0–7.0)

## 2021-05-22 NOTE — Patient Instructions (Signed)
I will send you a note about your labs. You look great! Let me see you in 4 months. Call if something comes up before then. No medicine changes made today.

## 2021-05-23 LAB — BASIC METABOLIC PANEL
BUN/Creatinine Ratio: 6 — ABNORMAL LOW (ref 10–24)
BUN: 9 mg/dL (ref 8–27)
CO2: 23 mmol/L (ref 20–29)
Calcium: 8.3 mg/dL — ABNORMAL LOW (ref 8.6–10.2)
Chloride: 107 mmol/L — ABNORMAL HIGH (ref 96–106)
Creatinine, Ser: 1.4 mg/dL — ABNORMAL HIGH (ref 0.76–1.27)
Glucose: 153 mg/dL — ABNORMAL HIGH (ref 70–99)
Potassium: 4.1 mmol/L (ref 3.5–5.2)
Sodium: 142 mmol/L (ref 134–144)
eGFR: 57 mL/min/{1.73_m2} — ABNORMAL LOW (ref >59)

## 2021-05-23 NOTE — Assessment & Plan Note (Signed)
He is also left followed by cardiology and I reviewed their note.  They made no medication changes and I will make none either.  We will get some labs today and follow-up 3 to 4 months.

## 2021-05-23 NOTE — Assessment & Plan Note (Signed)
We will check labs today.  He appears euvolemic.

## 2021-05-23 NOTE — Assessment & Plan Note (Signed)
Chronic knee pain.  He is probably not going to be very good candidate for knee replacement.  We will continue chronic pain meds at current dose follow-up 3 months.

## 2021-05-23 NOTE — Progress Notes (Signed)
° ° °  CHIEF COMPLAINT / HPI: #1.  Follow-up chronic systolic heart failure: He has been taking his Lasix intermittently and that seems to be working well.  Recently saw his cardiologist and they made no medication changes.  He has not had any chest pains with exertion. 2.:  Housing instability: He continues to live alone currently as his son is incarcerated.  He has a sister who lives close by who assists him with some of his ADLs and shopping, meals.  He has a brother that lives across the street and he visits with him occasionally. 3.  Chronic knee pain: Is been a little worse with the colder weather but he is hopeful that as spring arrives it will improve some.  Keeps him from doing much walking.   PERTINENT  PMH / PSH: I have reviewed the patients medications, allergies, past medical and surgical history, smoking status and updated in the EMR as appropriate.   OBJECTIVE:  BP 124/86    Pulse (!) (P) 58    Wt (!) (P) 301 lb 6.4 oz (136.7 kg)    SpO2 (P) 97%    BMI (P) 47.21 kg/m  Vital signs reviewed. GENERAL: Well-developed, well-nourished, no acute distress. CARDIOVASCULAR: Regular rate and rhythm no murmur gallop or rub LUNGS: Clear to auscultation bilaterally, no rales or wheeze. ABDOMEN: Soft positive bowel sounds.  Obese NEURO: Blind both eyes MSK: Movement of extremity x 4.  Right knee lacks full extension by 5 degrees.  Mild tenderness to palpation medial joint line.   ASSESSMENT / PLAN:   HYPERTENSION, BENIGN He is also left followed by cardiology and I reviewed their note.  They made no medication changes and I will make none either.  We will get some labs today and follow-up 3 to 4 months.  RENAL DISEASE, CHRONIC, STAGE III We will check labs today.  He appears euvolemic.  Arthritis of knee, degenerative Chronic knee pain.  He is probably not going to be very good candidate for knee replacement.  We will continue chronic pain meds at current dose follow-up 3 months.    [name] MD

## 2021-05-27 ENCOUNTER — Ambulatory Visit (INDEPENDENT_AMBULATORY_CARE_PROVIDER_SITE_OTHER): Payer: Medicare Other

## 2021-05-27 DIAGNOSIS — Z95810 Presence of automatic (implantable) cardiac defibrillator: Secondary | ICD-10-CM

## 2021-05-27 DIAGNOSIS — I5022 Chronic systolic (congestive) heart failure: Secondary | ICD-10-CM | POA: Diagnosis not present

## 2021-05-28 ENCOUNTER — Encounter: Payer: Self-pay | Admitting: Family Medicine

## 2021-05-28 NOTE — Progress Notes (Signed)
EPIC Encounter for ICM Monitoring  Patient Name: [name] is a 61 y.o. male Date: 05/28/2021 Primary Care Physican: Dickie La, MD Primary Cardiologist: Terrence Dupont  Electrophysiologist: Caryl Comes 05/20/2021 Weight: 300 lbs     Spoke with wife per DPR.  She stated pt is doing well and has not reported any fluid symptoms.   Pt lives alone and is blind from glaucoma (brothers and sisters help with meals and shopping).   Optivol thoracic impedance suggesting possible fluid accumulation starting 2/4.   Prescribed:  Furosemide 40 mg Take 1 tablet by mouth as directed.  He takes PRN. Farxiga 10 mg take 1 tablet daily    Labs: 05/22/2021 Creatinine 1.40, BUN 9, Potassium 4.1, Sodium 142, GFR 57 A complete set of results can be found in Results Review.   Recommendations:  Advised to take PRN Furosemide 40 mg 1 tablet x 2 days and then return to PRN.  She verbalized understanding and will have pt follow recommendations.    Follow-up plan: ICM clinic phone appointment on 05/31/2021 to recheck fluid levels.   91 day device clinic remote transmission 06/13/2021.           EP/Cardiology Office Visits:  08/22/2021 with Tommye Standard, PA.     Copy of ICM check sent to Dr. Caryl Comes.  3 month ICM trend: 05/27/2021.    12-14 Month ICM trend:     Rosalene Billings, RN 05/28/2021 8:33 AM

## 2021-05-31 ENCOUNTER — Ambulatory Visit (INDEPENDENT_AMBULATORY_CARE_PROVIDER_SITE_OTHER): Payer: Medicare Other

## 2021-05-31 DIAGNOSIS — Z95810 Presence of automatic (implantable) cardiac defibrillator: Secondary | ICD-10-CM

## 2021-05-31 DIAGNOSIS — I5022 Chronic systolic (congestive) heart failure: Secondary | ICD-10-CM

## 2021-05-31 NOTE — Progress Notes (Signed)
EPIC Encounter for ICM Monitoring  Patient Name: [name] is a 61 y.o. male Date: 05/31/2021 Primary Care Physican: Dickie La, MD Primary Cardiologist: Terrence Dupont  Electrophysiologist: Caryl Comes 05/20/2021 Weight: 300 lbs     Spoke with wife per DPR.  She stated pt is doing well.   Pt lives alone and is blind from glaucoma (brothers and sisters help with meals and shopping).   Optivol thoracic impedance suggesting fluid levels returned to baseline after taking PRN Furosemide.   Prescribed:  Furosemide 40 mg Take 1 tablet by mouth as directed.  He takes PRN. Farxiga 10 mg take 1 tablet daily    Labs: 05/22/2021 Creatinine 1.40, BUN 9, Potassium 4.1, Sodium 142, GFR 57 A complete set of results can be found in Results Review.   Recommendations:  Advised to have patient limit salt and fluid intake.  Encouraged to call for any fluid symptoms.    Follow-up plan: ICM clinic phone appointment on 06/13/2021 to recheck fluid levels.   91 day device clinic remote transmission 06/13/2021.           EP/Cardiology Office Visits:  08/22/2021 with Tommye Standard, PA.     Copy of ICM check sent to Dr. Caryl Comes.  3 month ICM trend: 05/31/2021.    12-14 Month ICM trend:     Rosalene Billings, RN 05/31/2021 10:06 AM

## 2021-06-03 ENCOUNTER — Other Ambulatory Visit: Payer: Self-pay | Admitting: Family Medicine

## 2021-06-11 ENCOUNTER — Other Ambulatory Visit: Payer: Self-pay | Admitting: Family Medicine

## 2021-06-11 ENCOUNTER — Telehealth: Payer: Self-pay | Admitting: Family Medicine

## 2021-06-11 DIAGNOSIS — G8929 Other chronic pain: Secondary | ICD-10-CM

## 2021-06-11 DIAGNOSIS — M170 Bilateral primary osteoarthritis of knee: Secondary | ICD-10-CM

## 2021-06-11 DIAGNOSIS — M25569 Pain in unspecified knee: Secondary | ICD-10-CM

## 2021-06-11 NOTE — Telephone Encounter (Signed)
Patients wife is calling requesting his hydrocodone to be refilled.  ?

## 2021-06-13 ENCOUNTER — Ambulatory Visit (INDEPENDENT_AMBULATORY_CARE_PROVIDER_SITE_OTHER): Payer: Medicare Other

## 2021-06-13 DIAGNOSIS — I428 Other cardiomyopathies: Secondary | ICD-10-CM

## 2021-06-13 LAB — CUP PACEART REMOTE DEVICE CHECK
Battery Remaining Longevity: 69 mo
Battery Voltage: 2.99 V
Brady Statistic RV Percent Paced: 0.01 %
Date Time Interrogation Session: 20230309042306
HighPow Impedance: 41 Ohm
HighPow Impedance: 51 Ohm
Implantable Lead Implant Date: 20081114
Implantable Lead Location: 753860
Implantable Lead Model: 6947
Implantable Pulse Generator Implant Date: 20170222
Lead Channel Impedance Value: 380 Ohm
Lead Channel Impedance Value: 399 Ohm
Lead Channel Pacing Threshold Amplitude: 0.625 V
Lead Channel Pacing Threshold Pulse Width: 0.4 ms
Lead Channel Sensing Intrinsic Amplitude: 6.625 mV
Lead Channel Sensing Intrinsic Amplitude: 6.625 mV
Lead Channel Setting Pacing Amplitude: 2.5 V
Lead Channel Setting Pacing Pulse Width: 0.4 ms
Lead Channel Setting Sensing Sensitivity: 0.3 mV

## 2021-06-13 NOTE — Telephone Encounter (Signed)
Dear Nyoka Cowden Team ?I just refilled it on March 7. ?[name] ? ?

## 2021-06-14 ENCOUNTER — Ambulatory Visit (INDEPENDENT_AMBULATORY_CARE_PROVIDER_SITE_OTHER): Payer: Medicare Other

## 2021-06-14 ENCOUNTER — Telehealth: Payer: Self-pay

## 2021-06-14 DIAGNOSIS — Z95810 Presence of automatic (implantable) cardiac defibrillator: Secondary | ICD-10-CM

## 2021-06-14 DIAGNOSIS — I5022 Chronic systolic (congestive) heart failure: Secondary | ICD-10-CM

## 2021-06-14 NOTE — Progress Notes (Signed)
EPIC Encounter for ICM Monitoring ? ?Patient Name: [name] is a 61 y.o. male ?Date: 06/14/2021 ?Primary Care Physican: Dickie La, MD ?Primary Cardiologist: Terrence Dupont  ?Electrophysiologist: Caryl Comes ?05/20/2021 Weight: 300 lbs   ?  ?Attempted call to wife and unable to reach.  Left detailed message per DPR regarding transmission. Transmission reviewed. .   Pt lives alone and is blind from glaucoma (brothers and sisters help with meals and shopping). ?  ?Optivol thoracic impedance suggesting trending close to baseline normal. ?  ?Prescribed:  ?Furosemide 40 mg Take 1 tablet by mouth as directed.  He takes PRN. ?Farxiga 10 mg take 1 tablet daily ?   ?Labs: ?05/22/2021 Creatinine 1.40, BUN 9, Potassium 4.1, Sodium 142, GFR 57 ?A complete set of results can be found in Results Review. ?  ?Recommendations:  Left voice mail with ICM number and encouraged to call if experiencing any fluid symptoms. ?  ?Follow-up plan: ICM clinic phone appointment on 07/01/2021.   91 day device clinic remote transmission 09/14/2021.   ?  ?      EP/Cardiology Office Visits:  08/22/2021 with Tommye Standard, PA.   ?  ?Copy of ICM check sent to Dr. Caryl Comes. ? ?3 month ICM trend: 06/13/2021. ? ? ? ?12-14 Month ICM trend:  ? ? ? ?Rosalene Billings, RN ?06/14/2021 ?7:53 AM ? ?

## 2021-06-14 NOTE — Telephone Encounter (Signed)
Remote ICM transmission received.  Attempted call to wife regarding ICM remote transmission and left detailed message per DPR.  Advised to return call for any fluid symptoms or questions. Next ICM remote transmission scheduled 07/01/2021.   ? ?

## 2021-06-24 ENCOUNTER — Other Ambulatory Visit: Payer: Self-pay

## 2021-06-24 ENCOUNTER — Emergency Department (HOSPITAL_COMMUNITY): Payer: Medicare Other

## 2021-06-24 ENCOUNTER — Encounter (HOSPITAL_COMMUNITY): Payer: Self-pay | Admitting: Pharmacy Technician

## 2021-06-24 ENCOUNTER — Emergency Department (HOSPITAL_COMMUNITY)
Admission: EM | Admit: 2021-06-24 | Discharge: 2021-06-24 | Disposition: A | Discharge status: Home / Self Care | Payer: Medicare Other | Attending: Student | Admitting: Student

## 2021-06-24 DIAGNOSIS — R002 Palpitations: Secondary | ICD-10-CM | POA: Diagnosis not present

## 2021-06-24 DIAGNOSIS — E119 Type 2 diabetes mellitus without complications: Secondary | ICD-10-CM | POA: Insufficient documentation

## 2021-06-24 DIAGNOSIS — R079 Chest pain, unspecified: Secondary | ICD-10-CM | POA: Diagnosis not present

## 2021-06-24 DIAGNOSIS — Z5321 Procedure and treatment not carried out due to patient leaving prior to being seen by health care provider: Secondary | ICD-10-CM | POA: Diagnosis not present

## 2021-06-24 DIAGNOSIS — I10 Essential (primary) hypertension: Secondary | ICD-10-CM | POA: Diagnosis not present

## 2021-06-24 DIAGNOSIS — R0789 Other chest pain: Secondary | ICD-10-CM | POA: Insufficient documentation

## 2021-06-24 LAB — CBC
HCT: 47.5 % (ref 39.0–52.0)
Hemoglobin: 15.3 g/dL (ref 13.0–17.0)
MCH: 27.8 pg (ref 26.0–34.0)
MCHC: 32.2 g/dL (ref 30.0–36.0)
MCV: 86.4 fL (ref 80.0–100.0)
Platelets: 213 10*3/uL (ref 150–400)
RBC: 5.5 MIL/uL (ref 4.22–5.81)
RDW: 14.1 % (ref 11.5–15.5)
WBC: 6.9 10*3/uL (ref 4.0–10.5)
nRBC: 0 % (ref 0.0–0.2)

## 2021-06-24 LAB — BASIC METABOLIC PANEL
Anion gap: 9 (ref 5–15)
BUN: 8 mg/dL (ref 8–23)
CO2: 21 mmol/L — ABNORMAL LOW (ref 22–32)
Calcium: 8.4 mg/dL — ABNORMAL LOW (ref 8.9–10.3)
Chloride: 108 mmol/L (ref 98–111)
Creatinine, Ser: 1.4 mg/dL — ABNORMAL HIGH (ref 0.61–1.24)
GFR, Estimated: 57 mL/min — ABNORMAL LOW (ref >60)
Glucose, Bld: 125 mg/dL — ABNORMAL HIGH (ref 70–99)
Potassium: 3.7 mmol/L (ref 3.5–5.1)
Sodium: 138 mmol/L (ref 135–145)

## 2021-06-24 LAB — TROPONIN I (HIGH SENSITIVITY): Troponin I (High Sensitivity): 17 ng/L (ref <18)

## 2021-06-24 NOTE — ED Triage Notes (Signed)
Pt bib ems with reports of acute onset CP lasting approx 15 minutes. Pain then changed to palpitations and went away. Pt now pain free. 12 lead unremarkable. 20g R hand. Given 324mg  aspirin pta. VSS. ?

## 2021-06-24 NOTE — ED Notes (Signed)
No response to role call for vitals  ?

## 2021-06-24 NOTE — ED Notes (Signed)
Pt is leaving, IV removed. ?

## 2021-06-24 NOTE — ED Notes (Signed)
Called twice for vitals and no response ?

## 2021-06-24 NOTE — ED Provider Triage Note (Signed)
Emergency Medicine Provider Triage Evaluation Note ? ?[name] , a 61 y.o. male  was evaluated in triage.  Patient was brought in by EMS today.  Pt complains of left-sided chest pain described as a tightness and squeezing sensation onset prior to arrival.  He notes that his chest pain lasted approximately 15 minutes and resolved.  He was given aspirin by EMS prior to arrival to the ED.  Patient is his chest pain has resolved at this time.  He has a history of diabetes, hypertension, elevated cholesterol and is compliant with his medications.  Denies past medical history of MI, cardiac catheterization, stents.  Denies shortness of breath, abdominal pain. ? ?Review of Systems  ?Positive: As per HPI above ?Negative:  ? ?Physical Exam  ?BP (!) 157/110   Pulse 67   Temp 98.1 ?F (36.7 ?C)   Resp 17   SpO2 96%  ?Gen:   Awake, no distress   ?Resp:  Normal effort  ?MSK:   Moves extremities without difficulty  ?Other:  No chest wall tenderness to palpation. ? ?Medical Decision Making  ?Medically screening exam initiated at 5:29 PM.  Appropriate orders placed.  [name] was informed that the remainder of the evaluation will be completed by another provider, this initial triage assessment does not replace that evaluation, and the importance of remaining in the ED until their evaluation is complete. ? ?  ?Malvika Tung A, PA-C ?06/24/21 1744 ? ?

## 2021-06-26 ENCOUNTER — Other Ambulatory Visit: Payer: Self-pay | Admitting: *Deleted

## 2021-06-26 DIAGNOSIS — S39012A Strain of muscle, fascia and tendon of lower back, initial encounter: Secondary | ICD-10-CM

## 2021-06-26 NOTE — Telephone Encounter (Signed)
Flexeril filled today and per pharmacy any refills will be placed on file for the next month.  Maury Groninger,CMA ? ?

## 2021-06-26 NOTE — Progress Notes (Signed)
Remote ICD transmission.   

## 2021-06-27 MED ORDER — CYCLOBENZAPRINE HCL 5 MG PO TABS: 5.0000 mg | ORAL_TABLET | Freq: Every day | ORAL | 1 refills | Status: DC

## 2021-07-04 ENCOUNTER — Telehealth: Payer: Self-pay

## 2021-07-04 NOTE — Telephone Encounter (Signed)
The patient wife states he will not be home until Monday and he is blind. Even if he was home he would not be able to do it. I told her I will let Margarita Grizzle know. ?

## 2021-07-05 NOTE — Progress Notes (Signed)
No ICM remote transmission received for 07/01/2021 and next ICM transmission scheduled for 07/22/2021.   ?

## 2021-07-08 ENCOUNTER — Telehealth: Payer: Self-pay

## 2021-07-08 NOTE — Telephone Encounter (Signed)
Returned call to patients wife, per DPR.  She stated patient was on the line with her and he was not able to send remote transmission since he lives alone.  She reports all reports will need to be scheduled since he is blind and cant see to send remote transmissions manually.  Pt reports he is doing well.  Advised will change remote transmissions to every 3 months due to it is difficult for patient to manage manual transmissions when needed on a monthly basis.  Advised to keep ICM contact number and can call if patient feels like he is developing fluid during the scheduled 3 month transmission.   ?

## 2021-07-09 ENCOUNTER — Other Ambulatory Visit: Payer: Self-pay

## 2021-07-09 DIAGNOSIS — M170 Bilateral primary osteoarthritis of knee: Secondary | ICD-10-CM

## 2021-07-09 DIAGNOSIS — G8929 Other chronic pain: Secondary | ICD-10-CM

## 2021-07-09 MED ORDER — HYDROCODONE-ACETAMINOPHEN 5-325 MG PO TABS: ORAL_TABLET | ORAL | 0 refills | Status: DC

## 2021-07-17 ENCOUNTER — Other Ambulatory Visit: Payer: Self-pay | Admitting: Family Medicine

## 2021-07-22 DIAGNOSIS — E785 Hyperlipidemia, unspecified: Secondary | ICD-10-CM | POA: Diagnosis not present

## 2021-07-22 DIAGNOSIS — E119 Type 2 diabetes mellitus without complications: Secondary | ICD-10-CM | POA: Diagnosis not present

## 2021-07-22 DIAGNOSIS — I420 Dilated cardiomyopathy: Secondary | ICD-10-CM | POA: Diagnosis not present

## 2021-07-22 DIAGNOSIS — I5020 Unspecified systolic (congestive) heart failure: Secondary | ICD-10-CM | POA: Diagnosis not present

## 2021-07-22 DIAGNOSIS — I10 Essential (primary) hypertension: Secondary | ICD-10-CM | POA: Diagnosis not present

## 2021-08-05 ENCOUNTER — Other Ambulatory Visit: Payer: Self-pay | Admitting: Family Medicine

## 2021-08-05 ENCOUNTER — Other Ambulatory Visit: Payer: Self-pay

## 2021-08-05 ENCOUNTER — Other Ambulatory Visit: Payer: Self-pay | Admitting: Pulmonary Disease

## 2021-08-05 DIAGNOSIS — M170 Bilateral primary osteoarthritis of knee: Secondary | ICD-10-CM

## 2021-08-05 DIAGNOSIS — G8929 Other chronic pain: Secondary | ICD-10-CM

## 2021-08-05 MED ORDER — HYDROCODONE-ACETAMINOPHEN 5-325 MG PO TABS: ORAL_TABLET | ORAL | 0 refills | Status: DC

## 2021-08-05 NOTE — Telephone Encounter (Signed)
Patients wife is calling and would like to request his hydrocodone to be filled.  ?

## 2021-08-12 ENCOUNTER — Other Ambulatory Visit: Payer: Self-pay | Admitting: Family Medicine

## 2021-08-21 NOTE — Progress Notes (Signed)
Cardiology Office Note Date:  08/21/2021  Patient ID:  [name], [name] [birth year], MRN [id number] PCP:  Dickie La, MD  Cardiologist:  Dr. Terrence Dupont Electrophysiologist: Dr. Caryl Comes    Chief Complaint:    3 mo,   History of Present Illness: [name] is a 61 y.o. male with history of HTN, HLD, DM, TIA, NICM, chronic CHF (systolic), ICD, blindness 2/2 glaucoma, CKD (III), AFib  He comes in today to be seen for Dr. Caryl Comes. Last seen by A. Lynnell Jude, NP for EP in Oct 2019, he was doing well, following with Dr. Terrence Dupont, no changes were made.  I saw him Oct 2020 He feels well.  Denies any CP, palpitations or cardiac awareness.  No SOB, does not exercise, but denies difficulties with ADLs.  No dizzy spells, no near syncope or syncope, no shocks He saw Dr. Terrence Dupont recently, states he does his labs as well as his PMD. He tells me he can see shadows, no detail Device function was intact, no changes were made.  Hospitalization June 2021, progressive SOB, admitted for acute hypoxic respiratory failure initially thought secondary to acute on chronic systolic heart failure complicated by paroxysmal atrial fibrillation, more likely bronchitis after further evaluation.  Also noted to have AKI. Discharged  09/17/19   I saw him 05/28/20 He c/w Dr. Terrence Dupont, had an echo done a couple weeks ago, reported to him that his heart remains "stiff" had a leaky valve of no concern. L. Short RN recently had him take his lasix daily for 4 days and resume PRN afterwards for elevated OptiVol. The patient denies SOB but does have a intermittent productive cough, no fever symptoms of illness. Denies noctural or positional SOB, no DOE> No CP, palpitations or cardiac awareness, no dizzy spells, near syncope or syncope. He is blind, so uncertain on stool/urine, denies bleeding gums with brushing/nose bleeds etc. OptiVol still abnormal and lasix pulsed again and planned f/u with Dr. Terrence Dupont, EP in a year  He saw  Dr. Caryl Comes 05/20/21 Doing well, continued meds, euvolemic, lasix being used PRN, no changes were made.   TODAY He feels well, says his heart has not been troubling him. No CP, palpitations or SOB No cardiac awareness He continues to follow closely with Dr. Terrence Dupont, Q 3 mo, see his PMD next month and Dr. Terrence Dupont in July No near syncope or syncope. No bleeding  His sister helps him   Device History: MDT single chamber ICD implanted 2008 for NICM; gen change 2017 History of appropriate therapy: No History of AAD therapy: No  Past Medical History:  Diagnosis Date   Arthritis    Arthritis of knee, degenerative 10/03/2011   Right > LEFT KNEE oa X-rays were not standing but showed some mild sclerosis and joint space loss of the medial compartment of the right knee. History of arthroscopy right knee. Report of injury as a child but unclear exactly what that was.  :I am aware of 12/01/18 Rx by Dr. Nori Riis.  While wife was hospitalized, narcotic Rx went missing when in-home sitter left.  To my knowledge, this is the first mi   Automatic implantable cardioverter-defibrillator in situ 07/15/2013   Blind in both eyes 06/15/2013   Right Sees Dr Katy Fitch   Cataracts, bilateral    right   Chronic gout due to renal impairment of multiple sites without tophus 4/0/3474   Chronic systolic heart failure ([place]) 02/18/2007   Qualifier: Diagnosis of  By: Unk Lightning MD, Tivis Ringer  Congestive heart failure ([place])    a. s/p MDT single chamber ICD    Diabetes mellitus    does not check blood sugars at home   Diverticulosis    Encounter for chronic pain management 05/17/2014   Indication for chronic opioid: severe arthritis RIGHTknee / low back Medication and dose: hydrocodone---uses prn # pills per month: gets #120 pills to last TWO months Last UDS date: 05/17/2014 Pain contract signed (Y/N): Y 05/17/2014 Date narcotic database last reviewed (include red flags): 05/17/2014    Glaucoma    bilateral   Glaucoma 11/07/2015    Severe See Dr. Katy Fitch   H/O TIA (transient ischemic attack) and stroke    Hyperlipidemia    Hyperlipidemia with target low density lipoprotein (LDL) cholesterol less than 100 mg/dL 08/20/2011   Hypertension    HYPERTENSION, BENIGN 02/18/2007   Qualifier: Diagnosis of  By: Nori Riis MD, Drema Pry of both feet 04/12/2020   NICM (nonischemic cardiomyopathy) ([place]) 05/30/2015   Obesity    OBESITY, MORBID 05/21/2010   Qualifier: Diagnosis of  By: Caryl Comes, MD, Remus Blake    Obstructive sleep apnea 03/27/2009   Qualifier: Diagnosis of  By: Unk Lightning, RN, BSN, Melanie     Paroxysmal atrial fibrillation ([place]) 09/21/2019   Paroxysmal VT 06/22/2019   RENAL DISEASE, CHRONIC, STAGE III 02/18/2007   SHOULDER PAIN, LEFT 08/01/2009   History of left shoulder surgery.     Single implantable cardioverter-defibrillator (ICD) in situ 06/17/2011   Type 2 diabetes mellitus with stage 4 chronic kidney disease, without long-term current use of insulin ([place]) 08/25/2007    Past Surgical History:  Procedure Laterality Date   CARDIAC CATHETERIZATION  10/24/2003   EF of 45-50%   CARDIAC DEFIBRILLATOR PLACEMENT  2008   CATARACT EXTRACTION  2012   right   EP IMPLANTABLE DEVICE N/A 05/30/2015   Procedure: ICD Generator Changeout;  Surgeon: [name], MD;  Location: [facility];  Service: Cardiovascular;  Laterality: N/A;   KNEE SURGERY Right 2000   SHOULDER SURGERY  2012   left    Current Outpatient Medications  Medication Sig Dispense Refill   allopurinol (ZYLOPRIM) 100 MG tablet Take 0.5 tablets (50 mg total) by mouth daily. 45 tablet 3   blood glucose meter kit and supplies Dispense based on patient and insurance preference. Use up to four times daily as directed. (FOR ICD-10 E10.9, E11.9). 1 each 0   Blood Glucose Monitoring Suppl (ONETOUCH VERIO) w/Device KIT 1 kit by Does not apply route as directed. Use up to four times daily as directed. (FOR ICD-10 E10.9, E11.9). 1 kit 3   brimonidine  (ALPHAGAN) 0.2 % ophthalmic solution Place 1 drop into both eyes 2 (two) times daily.     carvedilol (COREG) 25 MG tablet Take 1 tablet (25 mg total) by mouth 2 (two) times daily with a meal.     cetirizine (ZYRTEC ALLERGY) 10 MG tablet Take 1 tablet (10 mg total) by mouth daily. 30 tablet 6   cyclobenzaprine (FLEXERIL) 5 MG tablet Take 1 tablet (5 mg total) by mouth at bedtime. 30 tablet 1   dapagliflozin propanediol (FARXIGA) 10 MG TABS tablet Take 1 tablet (10 mg total) by mouth daily before breakfast. 90 tablet 3   diclofenac Sodium (VOLTAREN) 1 % GEL Apply 2 g topically 4 (four) times daily. 100 g 0   dorzolamide-timolol (COSOPT) 22.3-6.8 MG/ML ophthalmic solution Place 1 drop into both eyes 2 (two) times daily.  ELIQUIS 5 MG TABS tablet Take 1 tablet (5 mg total) by mouth 2 (two) times daily. 180 tablet 1   EPINEPHRINE 0.3 mg/0.3 mL IJ SOAJ injection Inject 0.3 mg into the muscle as needed for anaphylaxis. 2 each 1   FLOVENT HFA 110 MCG/ACT inhaler Inhale 2 puffs into the lungs 2 (two) times daily. 12 g 0   fluticasone (FLONASE) 50 MCG/ACT nasal spray Place 2 sprays into both nostrils daily. 16 g 2   furosemide (LASIX) 40 MG tablet Take 1 tablet (40 mg total) by mouth as directed. 90 tablet 0   glimepiride (AMARYL) 4 MG tablet Take 4 mg by mouth daily.     glucose blood (ONETOUCH VERIO) test strip Use as instructed. Use up to four times daily as directed. (FOR ICD-10 E10.9, E11.9). 100 each 12   HYDROcodone-acetaminophen (NORCO/VICODIN) 5-325 MG tablet Take one by mouth up to 4 times a day prn knee pain and may take additional one pill intermittently for max of 5 tabs a day 150 tablet 0   ipratropium (ATROVENT) 0.03 % nasal spray Place 2 sprays into both nostrils every 12 (twelve) hours. 30 mL 12   isosorbide mononitrate (ISMO) 20 MG tablet TAKE ONE TABLET BY MOUTH TWICE DAILY 180 tablet 3   LANTUS SOLOSTAR 100 UNIT/ML Solostar Pen Inject 25 units into skin once daily 27 mL 3   latanoprost  (XALATAN) 0.005 % ophthalmic solution Place 1 drop into both eyes every evening.     NARCAN 4 MG/0.1ML LIQD nasal spray kit      nitroGLYCERIN (NITROSTAT) 0.4 MG SL tablet Place 0.4 mg under the tongue as needed.     NOVOLOG 100 UNIT/ML injection      rosuvastatin (CRESTOR) 10 MG tablet Take 1 tablet (10 mg total) by mouth daily. Replaces lipitor 90 tablet 3   sacubitril-valsartan (ENTRESTO) 49-51 MG Take 1 tablet by mouth 2 (two) times daily.     spironolactone (ALDACTONE) 25 MG tablet Take 25 mg by mouth 2 (two) times daily.      SURE COMFORT PEN NEEDLES 32G X 6 MM MISC 1 Units by Does not apply route in the morning, at noon, in the evening, and at bedtime. 100 each 1   TRUEPLUS INSULIN SYRINGE 31G X 5/16" 0.3 ML MISC      No current facility-administered medications for this visit.    Allergies:   Bee pollen and Nsaids   Social History:  The patient  reports that he has never smoked. He has never used smokeless tobacco. He reports that he does not drink alcohol and does not use drugs.   Family History:  The patient's family history includes Colon cancer in his father.  ROS:  Please see the history of present illness.  All other systems are reviewed and otherwise negative.   PHYSICAL EXAM:  VS:  There were no vitals taken for this visit. BMI: There is no height or weight on file to calculate BMI. Well nourished, well developed, in no acute distress  HEENT: normocephalic, atraumatic  Neck: no JVD, carotid bruits or masses Cardiac:  RRR; no significant murmurs, no rubs, or gallops Lungs:  CTA b/l, no wheezing, rhonchi or rales  Abd: soft, nontender, obese MS: no deformity or atrophy Ext: trace-1+ edema to mid shin edema  Skin: warm and dry, no rash Neuro:  No gross deficits appreciated Psych: euthymic mood, full affect  ICD site is stable, no tethering or discomfort   EKG:  Not done today  ICD interrogation done today and reviewed by myself:  Battery and lead measurements  are good No V arrhythmias, no therapies   05/01/15: TTE Study Conclusions - Left ventricle: The cavity size was normal. There was moderate   focal basal hypertrophy of the septum. Systolic function was   mildly to moderately reduced. The estimated ejection fraction was   in the range of 40% to 45%. Diffuse hypokinesis. Doppler   parameters are consistent with abnormal left ventricular   relaxation (grade 1 diastolic dysfunction). - Aortic valve: Transvalvular velocity was within the normal range.   There was no stenosis. There was no regurgitation. - Mitral valve: Transvalvular velocity was within the normal range.   There was no evidence for stenosis. There was no regurgitation. - Right ventricle: The cavity size was moderately dilated. Wall   thickness was normal. Systolic function was normal. - Tricuspid valve: There was no regurgitation.    Recent Labs: 11/15/2020: ALT 11; TSH 2.600 06/24/2021: BUN 8; Creatinine, Ser 1.40; Hemoglobin 15.3; Platelets 213; Potassium 3.7; Sodium 138  No results found for requested labs within last 8760 hours.   CrCl cannot be calculated (Patient's most recent lab result is older than the maximum 21 days allowed.).   Wt Readings from Last 3 Encounters:  05/22/21 (!) (P) 301 lb 6.4 oz (136.7 kg)  05/20/21 300 lb (136.1 kg)  03/21/21 (!) 310 lb 6.4 oz (140.8 kg)     Other studies reviewed: Additional studies/records reviewed today include: summarized above  ASSESSMENT AND PLAN:  1. ICD     Intact function, no programming changes made  2. NICM 3. Chronic CHF     He has some edema today, OptiVol remains below threshold, though in the rise     He took a PRN lasix Monday     On BB, entresto, nitrate, aldactone, lasix (is as needed)  Advised to use his lasix the next 2 days, and f/u with dr. Terrence Dupont if swelling does not improve   4. HTN     Looks OK, no changes  5. HLD     Monitored by Dr. Terrence Dupont, not addressed today  6. Paroxysmal  Afib     CHA2DS2Vasc is 5, on eliquis, appropriately dosed for his age/weight     Labs today       Disposition: we can see him annually for EP/device sooner if needed, remotes as usual.  Current medicines are reviewed at length with the patient today.  The patient did not have any concerns regarding medicines.  Venetia Night, PA-C 08/21/2021 12:19 PM     [address] [place] [id number] [phone] (office)  [phone] (fax)

## 2021-08-22 ENCOUNTER — Ambulatory Visit (INDEPENDENT_AMBULATORY_CARE_PROVIDER_SITE_OTHER): Payer: Medicare Other | Admitting: Physician Assistant

## 2021-08-22 ENCOUNTER — Encounter: Payer: Self-pay | Admitting: Physician Assistant

## 2021-08-22 VITALS — BP 116/86 | HR 71 | Ht 67.0 in | Wt 293.0 lb

## 2021-08-22 DIAGNOSIS — I5022 Chronic systolic (congestive) heart failure: Secondary | ICD-10-CM | POA: Diagnosis not present

## 2021-08-22 DIAGNOSIS — I428 Other cardiomyopathies: Secondary | ICD-10-CM | POA: Diagnosis not present

## 2021-08-22 DIAGNOSIS — Z79899 Other long term (current) drug therapy: Secondary | ICD-10-CM | POA: Diagnosis not present

## 2021-08-22 DIAGNOSIS — I480 Paroxysmal atrial fibrillation: Secondary | ICD-10-CM

## 2021-08-22 DIAGNOSIS — Z95810 Presence of automatic (implantable) cardiac defibrillator: Secondary | ICD-10-CM | POA: Diagnosis not present

## 2021-08-22 LAB — CUP PACEART INCLINIC DEVICE CHECK
Battery Remaining Longevity: 66 mo
Battery Voltage: 2.99 V
Brady Statistic RV Percent Paced: 0.01 %
Date Time Interrogation Session: 20230518122447
HighPow Impedance: 46 Ohm
HighPow Impedance: 63 Ohm
Implantable Lead Implant Date: 20081114
Implantable Lead Location: 753860
Implantable Lead Model: 6947
Implantable Pulse Generator Implant Date: 20170222
Lead Channel Impedance Value: 323 Ohm
Lead Channel Impedance Value: 399 Ohm
Lead Channel Pacing Threshold Amplitude: 0.625 V
Lead Channel Pacing Threshold Pulse Width: 0.4 ms
Lead Channel Sensing Intrinsic Amplitude: 6.75 mV
Lead Channel Sensing Intrinsic Amplitude: 8.5 mV
Lead Channel Setting Pacing Amplitude: 2.5 V
Lead Channel Setting Pacing Pulse Width: 0.4 ms
Lead Channel Setting Sensing Sensitivity: 0.3 mV

## 2021-08-22 LAB — CBC
Hematocrit: 43 % (ref 37.5–51.0)
Hemoglobin: 14.3 g/dL (ref 13.0–17.7)
MCH: 27.8 pg (ref 26.6–33.0)
MCHC: 33.3 g/dL (ref 31.5–35.7)
MCV: 84 fL (ref 79–97)
Platelets: 208 10*3/uL (ref 150–450)
RBC: 5.14 x10E6/uL (ref 4.14–5.80)
RDW: 14.5 % (ref 11.6–15.4)
WBC: 6.9 10*3/uL (ref 3.4–10.8)

## 2021-08-22 LAB — BASIC METABOLIC PANEL
BUN/Creatinine Ratio: 6 — ABNORMAL LOW (ref 10–24)
BUN: 9 mg/dL (ref 8–27)
CO2: 26 mmol/L (ref 20–29)
Calcium: 8.7 mg/dL (ref 8.6–10.2)
Chloride: 105 mmol/L (ref 96–106)
Creatinine, Ser: 1.47 mg/dL — ABNORMAL HIGH (ref 0.76–1.27)
Glucose: 71 mg/dL (ref 70–99)
Potassium: 4.3 mmol/L (ref 3.5–5.2)
Sodium: 141 mmol/L (ref 134–144)
eGFR: 54 mL/min/{1.73_m2} — ABNORMAL LOW (ref >59)

## 2021-08-22 NOTE — Patient Instructions (Addendum)
Medication Instructions:   Your physician recommends that you continue on your current medications as directed. Please refer to the Current Medication list given to you today.  *If you need a refill on your cardiac medications before your next appointment, please call your pharmacy*   Lab Work: BMET AND CBC TODAY   If you have labs (blood work) drawn today and your tests are completely normal, you will receive your results only by: [place] (if you have MyChart) OR A paper copy in the mail If you have any lab test that is abnormal or we need to change your treatment, we will call you to review the results.   Testing/Procedures: NONE ORDERED  TODAY     Follow-Up: At [facility], you and your health needs are our priority.  As part of our continuing mission to provide you with exceptional heart care, we have created designated Provider Care Teams.  These Care Teams include your primary Cardiologist (physician) and Advanced Practice Providers (APPs -  Physician Assistants and Nurse Practitioners) who all work together to provide you with the care you need, when you need it.  We recommend signing up for the patient portal called "MyChart".  Sign up information is provided on this After Visit Summary.  MyChart is used to connect with patients for Virtual Visits (Telemedicine).  Patients are able to view lab/test results, encounter notes, upcoming appointments, etc.  Non-urgent messages can be sent to your provider as well.   To learn more about what you can do with MyChart, go to [email or web address].    Your next appointment:   1 year(s)  The format for your next appointment:   In Person  Provider:   You may see [name], MD or one of the following Advanced Practice Providers on your designated Care Team:   Tommye Standard, [place] Legrand Como "Jonni Sanger" Chalmers Cater, [place]    Other Instructions   Important Information About Sugar

## 2021-09-03 ENCOUNTER — Other Ambulatory Visit: Payer: Self-pay | Admitting: Family Medicine

## 2021-09-03 DIAGNOSIS — M170 Bilateral primary osteoarthritis of knee: Secondary | ICD-10-CM

## 2021-09-03 DIAGNOSIS — S39012A Strain of muscle, fascia and tendon of lower back, initial encounter: Secondary | ICD-10-CM

## 2021-09-03 DIAGNOSIS — G8929 Other chronic pain: Secondary | ICD-10-CM

## 2021-09-04 MED ORDER — HYDROCODONE-ACETAMINOPHEN 5-325 MG PO TABS: ORAL_TABLET | ORAL | 0 refills | Status: DC

## 2021-09-13 ENCOUNTER — Other Ambulatory Visit: Payer: Self-pay

## 2021-09-13 ENCOUNTER — Encounter (HOSPITAL_COMMUNITY): Payer: Self-pay

## 2021-09-13 ENCOUNTER — Other Ambulatory Visit: Payer: Self-pay | Admitting: Pulmonary Disease

## 2021-09-13 ENCOUNTER — Emergency Department (HOSPITAL_COMMUNITY)
Admission: EM | Admit: 2021-09-13 | Discharge: 2021-09-14 | Disposition: A | Discharge status: Home / Self Care | Payer: Medicare Other | Attending: Emergency Medicine | Admitting: Emergency Medicine

## 2021-09-13 DIAGNOSIS — Z7984 Long term (current) use of oral hypoglycemic drugs: Secondary | ICD-10-CM | POA: Insufficient documentation

## 2021-09-13 DIAGNOSIS — R451 Restlessness and agitation: Secondary | ICD-10-CM | POA: Diagnosis not present

## 2021-09-13 DIAGNOSIS — W19XXXA Unspecified fall, initial encounter: Secondary | ICD-10-CM | POA: Diagnosis not present

## 2021-09-13 DIAGNOSIS — R440 Auditory hallucinations: Secondary | ICD-10-CM | POA: Insufficient documentation

## 2021-09-13 DIAGNOSIS — R441 Visual hallucinations: Secondary | ICD-10-CM

## 2021-09-13 DIAGNOSIS — R059 Cough, unspecified: Secondary | ICD-10-CM

## 2021-09-13 DIAGNOSIS — I10 Essential (primary) hypertension: Secondary | ICD-10-CM | POA: Diagnosis not present

## 2021-09-13 DIAGNOSIS — Z794 Long term (current) use of insulin: Secondary | ICD-10-CM | POA: Diagnosis not present

## 2021-09-13 DIAGNOSIS — Z01818 Encounter for other preprocedural examination: Secondary | ICD-10-CM | POA: Insufficient documentation

## 2021-09-13 DIAGNOSIS — Z20822 Contact with and (suspected) exposure to covid-19: Secondary | ICD-10-CM | POA: Diagnosis not present

## 2021-09-13 DIAGNOSIS — Z7901 Long term (current) use of anticoagulants: Secondary | ICD-10-CM | POA: Diagnosis not present

## 2021-09-13 DIAGNOSIS — H543 Unqualified visual loss, both eyes: Secondary | ICD-10-CM

## 2021-09-13 LAB — COMPREHENSIVE METABOLIC PANEL
ALT: 10 U/L (ref 0–44)
AST: 20 U/L (ref 15–41)
Albumin: 3.9 g/dL (ref 3.5–5.0)
Alkaline Phosphatase: 63 U/L (ref 38–126)
Anion gap: 12 (ref 5–15)
BUN: 13 mg/dL (ref 8–23)
CO2: 21 mmol/L — ABNORMAL LOW (ref 22–32)
Calcium: 9 mg/dL (ref 8.9–10.3)
Chloride: 109 mmol/L (ref 98–111)
Creatinine, Ser: 1.81 mg/dL — ABNORMAL HIGH (ref 0.61–1.24)
GFR, Estimated: 42 mL/min — ABNORMAL LOW (ref >60)
Glucose, Bld: 127 mg/dL — ABNORMAL HIGH (ref 70–99)
Potassium: 3.8 mmol/L (ref 3.5–5.1)
Sodium: 142 mmol/L (ref 135–145)
Total Bilirubin: 1.5 mg/dL — ABNORMAL HIGH (ref 0.3–1.2)
Total Protein: 7.5 g/dL (ref 6.5–8.1)

## 2021-09-13 LAB — RESP PANEL BY RT-PCR (FLU A&B, COVID) ARPGX2
Influenza A by PCR: NEGATIVE
Influenza B by PCR: NEGATIVE
SARS Coronavirus 2 by RT PCR: NEGATIVE

## 2021-09-13 LAB — RAPID URINE DRUG SCREEN, HOSP PERFORMED
Amphetamines: NOT DETECTED
Barbiturates: NOT DETECTED
Benzodiazepines: NOT DETECTED
Cocaine: NOT DETECTED
Opiates: POSITIVE — AB
Tetrahydrocannabinol: NOT DETECTED

## 2021-09-13 LAB — ACETAMINOPHEN LEVEL: Acetaminophen (Tylenol), Serum: <10 ug/mL — ABNORMAL LOW (ref 10–30)

## 2021-09-13 LAB — CBC
HCT: 48.4 % (ref 39.0–52.0)
Hemoglobin: 16.2 g/dL (ref 13.0–17.0)
MCH: 28.8 pg (ref 26.0–34.0)
MCHC: 33.5 g/dL (ref 30.0–36.0)
MCV: 86.1 fL (ref 80.0–100.0)
Platelets: 213 10*3/uL (ref 150–400)
RBC: 5.62 MIL/uL (ref 4.22–5.81)
RDW: 15.1 % (ref 11.5–15.5)
WBC: 11.1 10*3/uL — ABNORMAL HIGH (ref 4.0–10.5)
nRBC: 0 % (ref 0.0–0.2)

## 2021-09-13 LAB — CBG MONITORING, ED: Glucose-Capillary: 133 mg/dL — ABNORMAL HIGH (ref 70–99)

## 2021-09-13 LAB — ETHANOL: Alcohol, Ethyl (B): <10 mg/dL (ref <10)

## 2021-09-13 LAB — SALICYLATE LEVEL: Salicylate Lvl: <7 mg/dL — ABNORMAL LOW (ref 7.0–30.0)

## 2021-09-13 MED ORDER — APIXABAN 5 MG PO TABS
5.0000 mg | ORAL_TABLET | Freq: Two times a day (BID) | ORAL | Status: DC
  Administered 2021-09-14 (×2): 5 mg via ORAL
  Filled 2021-09-13 (×2): qty 1

## 2021-09-13 MED ORDER — LATANOPROST 0.005 % OP SOLN
1.0000 [drp] | Freq: Every evening | OPHTHALMIC | Status: DC
Start: 2021-09-13 — End: 2021-09-14
  Administered 2021-09-14: 1 [drp] via OPHTHALMIC
  Filled 2021-09-13: qty 2.5

## 2021-09-13 MED ORDER — IPRATROPIUM BROMIDE 0.06 % NA SOLN
1.0000 | Freq: Two times a day (BID) | NASAL | Status: DC
Start: 2021-09-13 — End: 2021-09-14
  Filled 2021-09-13: qty 15

## 2021-09-13 MED ORDER — DAPAGLIFLOZIN PROPANEDIOL 10 MG PO TABS
10.0000 mg | ORAL_TABLET | Freq: Every day | ORAL | Status: DC
  Administered 2021-09-14: 10 mg via ORAL
  Filled 2021-09-13: qty 1

## 2021-09-13 MED ORDER — SACUBITRIL-VALSARTAN 49-51 MG PO TABS
1.0000 | ORAL_TABLET | Freq: Two times a day (BID) | ORAL | Status: DC
Start: 2021-09-13 — End: 2021-09-14
  Administered 2021-09-14 (×2): 1 via ORAL
  Filled 2021-09-13 (×3): qty 1

## 2021-09-13 MED ORDER — BRIMONIDINE TARTRATE 0.2 % OP SOLN
1.0000 [drp] | Freq: Two times a day (BID) | OPHTHALMIC | Status: DC
  Administered 2021-09-14 (×2): 1 [drp] via OPHTHALMIC
  Filled 2021-09-13: qty 5

## 2021-09-13 MED ORDER — FUROSEMIDE 20 MG PO TABS: 40.0000 mg | ORAL_TABLET | Freq: Every day | ORAL | Status: DC | PRN

## 2021-09-13 MED ORDER — ROSUVASTATIN CALCIUM 5 MG PO TABS
10.0000 mg | ORAL_TABLET | Freq: Every day | ORAL | Status: DC
  Administered 2021-09-14: 10 mg via ORAL
  Filled 2021-09-13: qty 2

## 2021-09-13 MED ORDER — INSULIN GLARGINE-YFGN 100 UNIT/ML ~~LOC~~ SOLN
25.0000 [IU] | Freq: Every day | SUBCUTANEOUS | Status: DC
  Administered 2021-09-14: 25 [IU] via SUBCUTANEOUS
  Filled 2021-09-13 (×2): qty 0.25

## 2021-09-13 MED ORDER — ALLOPURINOL 100 MG PO TABS
50.0000 mg | ORAL_TABLET | Freq: Every day | ORAL | Status: DC
  Administered 2021-09-14: 50 mg via ORAL
  Filled 2021-09-13: qty 1

## 2021-09-13 MED ORDER — ISOSORBIDE MONONITRATE 20 MG PO TABS
20.0000 mg | ORAL_TABLET | Freq: Two times a day (BID) | ORAL | Status: DC
  Administered 2021-09-14: 20 mg via ORAL
  Filled 2021-09-13 (×2): qty 1

## 2021-09-13 MED ORDER — LORAZEPAM 2 MG/ML IJ SOLN
1.0000 mg | Freq: Once | INTRAMUSCULAR | Status: AC
  Administered 2021-09-14: 1 mg via INTRAMUSCULAR
  Filled 2021-09-13: qty 1

## 2021-09-13 MED ORDER — CARVEDILOL 12.5 MG PO TABS
25.0000 mg | ORAL_TABLET | Freq: Two times a day (BID) | ORAL | Status: DC
  Administered 2021-09-14: 25 mg via ORAL
  Filled 2021-09-13: qty 2

## 2021-09-13 MED ORDER — DORZOLAMIDE HCL-TIMOLOL MAL 2-0.5 % OP SOLN
1.0000 [drp] | Freq: Two times a day (BID) | OPHTHALMIC | Status: DC
  Administered 2021-09-14 (×2): 1 [drp] via OPHTHALMIC
  Filled 2021-09-13: qty 10

## 2021-09-13 MED ORDER — FLUTICASONE PROPIONATE HFA 110 MCG/ACT IN AERO
2.0000 | INHALATION_SPRAY | Freq: Two times a day (BID) | RESPIRATORY_TRACT | Status: DC
  Filled 2021-09-13: qty 12

## 2021-09-13 MED ORDER — GLIMEPIRIDE 4 MG PO TABS
4.0000 mg | ORAL_TABLET | Freq: Every day | ORAL | Status: DC
  Administered 2021-09-14: 4 mg via ORAL
  Filled 2021-09-13 (×2): qty 1

## 2021-09-13 NOTE — ED Notes (Signed)
Spoke with  patient's daughter who called to inquire how long patient will be held under IVC; RN advised that has not been determined yet and patient allowed to make phone call tomorrow morning as patient is not trying to get rest-Monique,RN

## 2021-09-13 NOTE — ED Provider Triage Note (Signed)
Emergency Medicine Provider Triage Evaluation Note  [name] , a 61 y.o. male  was evaluated in triage.  Pt brought in by EMS after being found behind a building.  Family reported him missing yesterday.  Review of Systems  Positive: Visual hallucinations Negative: SI/HI, auditory hallucinations  Physical Exam  BP (!) 164/100 (BP Location: Right Arm)   Pulse 82   Temp 98.5 F (36.9 C) (Oral)   Resp 20   Ht 5\' 7"  (1.702 m)   Wt 132.9 kg   SpO2 98%   BMI 45.89 kg/m  Gen:   Awake, no distress   Resp:  Normal effort  MSK:   Moves extremities without difficulty  Other:  Patient is blind secondary to cataracts per chart review.  Says that he has seen multiple people around him who are talking to him.  They are not present in triage.  Medical Decision Making  Medically screening exam initiated at 5:17 PM.  Appropriate orders placed.  Dixon Boos was informed that the remainder of the evaluation will be completed by another provider, this initial triage assessment does not replace that evaluation, and the importance of remaining in the ED until their evaluation is complete.     Rhae Hammock, PA-C 09/13/21 1718

## 2021-09-13 NOTE — ED Notes (Signed)
Pt yelling in room, saying "I'm sick of seeing people I know aren't in the room." Pt is completely blind except for some light variance, pt tangential in speech.

## 2021-09-13 NOTE — ED Triage Notes (Signed)
Pt arrived via [facility]. Pt was found sitting in the grass behind an apartment building where he had been all night. Family reported pt missing last night. Pt is legally blind.

## 2021-09-13 NOTE — BH Assessment (Signed)
Comprehensive Clinical Assessment (CCA) Note  09/13/2021 [name] [id number]   Adam Reichert, NP, recommends overnight obs reassessment by psych on 09/14/21.  Chief Complaint: 61 year old male present to [facility] Ed with agitation and endorsement of visual hallucinations.  Patient is nearly completely blind with minimal ability to detect light versus dark.  Patient report he left his residence of his own volition yesterday due visual hallucinations. Patient was found sitting in the grass behind an apartment building where he had been all night. Patient stated he felt he had to leave his home due to the hallucinations.  He lives by himself.  Patient's family reported him missing yesterday.  Patient reports that his visual hallucinations were bothering him and he then decided to leave his residence. Patient denied additional mental health concerns, denied homicidal ideations and denied auditory hallucinations. Denied substance use. Report has multiple health concerns and takes medication for medical issues.      Chief Complaint  Patient presents with   Medical Clearance   Visit Diagnosis: auditory hallucinations    CCA Screening, Triage and Referral (STR)  Patient Reported Information How did you hear about [place]? No data recorded What Is the Reason for Your Visit/Call Today? No data recorded How Long Has This Been Causing You Problems? No data recorded What Do You Feel Would Help You the Most Today? No data recorded  Have You Recently Had Any Thoughts About Hurting Yourself? No data recorded Are You Planning to Commit Suicide/Harm Yourself At This time? No data recorded  Have you Recently Had Thoughts About [place]? No data recorded Are You Planning to Harm Someone at This Time? No data recorded Explanation: No data recorded  Have You Used Any Alcohol or Drugs in the Past 24 Hours? No data recorded How Long Ago Did You Use Drugs or Alcohol? No data recorded What Did You  Use and How Much? No data recorded  Do You Currently Have a Therapist/Psychiatrist? No data recorded Name of Therapist/Psychiatrist: No data recorded  Have You Been Recently Discharged From Any Office Practice or Programs? No data recorded Explanation of Discharge From Practice/Program: No data recorded    CCA Screening Triage Referral Assessment Type of Contact: No data recorded Telemedicine Service Delivery:   Is this Initial or Reassessment? No data recorded Date Telepsych consult ordered in [facility]:  No data recorded Time Telepsych consult ordered in [facility]:  No data recorded Location of Assessment: No data recorded Provider Location: No data recorded  Collateral Involvement: No data recorded  Does Patient Have a [place]? No data recorded Name and Contact of Legal Guardian: No data recorded If Minor and Not Living with Parent(s), Who has Custody? No data recorded Is CPS involved or ever been involved? No data recorded Is APS involved or ever been involved? No data recorded  Patient Determined To Be At Risk for Harm To Self or Others Based on Review of Patient Reported Information or Presenting Complaint? No data recorded Method: No data recorded Availability of Means: No data recorded Intent: No data recorded Notification Required: No data recorded Additional Information for Danger to Others Potential: No data recorded Additional Comments for Danger to Others Potential: No data recorded Are There Guns or Other Weapons in Your Home? No data recorded Types of Guns/Weapons: No data recorded Are These Weapons Safely Secured?  No data recorded Who Could Verify You Are Able To Have These Secured: No data recorded Do You Have any Outstanding Charges, Pending Court Dates, Parole/Probation? No data recorded Contacted To Inform of Risk of Harm To Self or Others: No data recorded   Does Patient Present under Involuntary Commitment? No data  recorded IVC Papers Initial File Date: No data recorded  [place] of Residence: No data recorded  Patient Currently Receiving the Following Services: No data recorded  Determination of Need: No data recorded  Options For Referral: No data recorded    CCA Biopsychosocial Patient Reported Schizophrenia/Schizoaffective Diagnosis in Past: No   Strengths: patient report he used to fish daily but after he lost his eye sight he can't fish anymore   Mental Health Symptoms Depression:   None   Duration of Depressive symptoms:    Mania:   None   Anxiety:    None   Psychosis:   None   Duration of Psychotic symptoms:    Trauma:   None   Obsessions:   None   Compulsions:   None   Inattention:   None   Hyperactivity/Impulsivity:   None   Oppositional/Defiant Behaviors:   None   Emotional Irregularity:   None   Other Mood/Personality Symptoms:   denied MH symptoms, only visual hallucinations    Mental Status Exam Appearance and self-care  Stature:   Average   Weight:   Obese   Clothing:   -- (patient in hospital cone)   Grooming:   Normal   Cosmetic use:  No data recorded  Posture/gait:   Normal   Motor activity:  No data recorded  Sensorium  Attention:   Normal   Concentration:   Normal   Orientation:   X5   Recall/memory:   Normal   Affect and Mood  Affect:   Appropriate   Mood:  No data recorded  Relating  Eye contact:   -- (patient is legal blind)   Facial expression:  No data recorded  Attitude toward examiner:   Cooperative   Thought and Language  Speech flow:  Normal   Thought content:   Appropriate to Mood and Circumstances   Preoccupation:   Other (Comment) (visual hallucinations)   Hallucinations:   Visual   Organization:  No data recorded  Transport planner of Knowledge:   Good   Intelligence:   Average   Abstraction:   Normal   Judgement:   Fair   Reality Testing:  No data recorded   Insight:   Good   Decision Making:   Impulsive   Social Functioning  Social Maturity:   Irresponsible   Social Judgement:   Normal   Stress  Stressors:   Other (Comment) (legally blind)   Coping Ability:   Normal   Skill Deficits:   -- (legally blind)   Supports:   Family     Religion:    Leisure/Recreation: Leisure / Recreation Do You Have Hobbies?: No  Exercise/Diet: Exercise/Diet Do You Exercise?: No Have You Gained or Lost A Significant Amount of Weight in the Past Six Months?: No Do You Follow a Special Diet?: No Do You Have Any Trouble Sleeping?: No   CCA Employment/Education Employment/Work Situation: Employment / Work Situation Employment Situation: On disability Why is Patient on Disability: Medical, Legally Blind  Education:     CCA Family/Childhood History Family and Relationship History: Family history Marital status: Married  Childhood History:  Childhood History Did patient suffer any verbal/emotional/physical/sexual abuse  as a child?: No Did patient suffer from severe childhood neglect?: No Has patient ever been sexually abused/assaulted/raped as an adolescent or adult?: No Was the patient ever a victim of a crime or a disaster?: No Witnessed domestic violence?: No Has patient been affected by domestic violence as an adult?: No  Child/Adolescent Assessment:     CCA Substance Use Alcohol/Drug Use:                           ASAM's:  Six Dimensions of Multidimensional Assessment  Dimension 1:  Acute Intoxication and/or Withdrawal Potential:      Dimension 2:  Biomedical Conditions and Complications:      Dimension 3:  Emotional, Behavioral, or Cognitive Conditions and Complications:     Dimension 4:  Readiness to Change:     Dimension 5:  Relapse, Continued use, or Continued Problem Potential:     Dimension 6:  Recovery/Living Environment:     ASAM Severity Score:    ASAM Recommended Level of Treatment:      Substance use Disorder (SUD)    Recommendations for Services/Supports/Treatments:    Discharge Disposition:    DSM5 Diagnoses: Patient Active Problem List   Diagnosis Date Noted   Chronic anticoagulation 08/23/2020   Metatarsalgia of both feet 04/12/2020   Chronic gout due to renal impairment of multiple sites without tophus 04/12/2020   Paroxysmal atrial fibrillation ([place]) 09/21/2019   Paroxysmal VT ([place]) 06/22/2019   Glaucoma 11/07/2015   NICM (nonischemic cardiomyopathy) ([place]) 05/30/2015   Encounter for chronic pain management 05/17/2014   Automatic implantable cardioverter-defibrillator in situ 07/15/2013   Blind in both eyes 06/15/2013   Arthritis of knee, degenerative 10/03/2011   Hyperlipidemia with target low density lipoprotein (LDL) cholesterol less than 100 mg/dL 08/20/2011   Single implantable cardioverter-defibrillator (ICD) in situ 06/17/2011   OBESITY, MORBID 05/21/2010   SHOULDER PAIN, LEFT 08/01/2009   Obstructive sleep apnea 03/27/2009   Type 2 diabetes mellitus with stage 4 chronic kidney disease, without long-term current use of insulin ([place]) 08/25/2007   HYPERTENSION, BENIGN 28/31/5176   Chronic systolic heart failure ([place]) 02/18/2007   RENAL DISEASE, CHRONIC, STAGE III 02/18/2007     Referrals to Alternative Service(s): Referred to Alternative Service(s):   Place:   Date:   Time:    Referred to Alternative Service(s):   Place:   Date:   Time:    Referred to Alternative Service(s):   Place:   Date:   Time:    Referred to Alternative Service(s):   Place:   Date:   Time:     Shayda Kalka, LCAS

## 2021-09-13 NOTE — ED Notes (Signed)
Wife Aldona Bar has been calling trying to speak with pt, but has been moved and isn't allowed a phone call at the moment, wife was made aware of that her number is [phone].

## 2021-09-13 NOTE — ED Notes (Signed)
Provided pt w/ a diet soda. Requesting "night time" meds.

## 2021-09-13 NOTE — ED Provider Notes (Signed)
[facility] EMERGENCY DEPARTMENT Provider Note   CSN: [id number] Arrival date & time: 09/13/21  1701     History  Chief Complaint  Patient presents with   Medical Clearance    RISHAWN WALCK is a 61 y.o. male.  61 year old male with prior medical history as detailed below presents for evaluation.  Patient presented to triage with agitation and endorsement of visual hallucinations.  Patient is nearly completely blind with minimal ability to detect light versus dark.  Patient apparently left his residence of his own volition yesterday.  He lives by himself.  Patient's family reported him missing yesterday.  Patient reports that his visual hallucinations were bothering him and he then decided to leave his residence.  Patient reports that he is seeing people in [employer].  He does not endorse SI or HI.  He is agitated and somewhat tangential in speech.  He is unable to provide a clear reason for why he left his residence and was found wandering.  IVC initiated in ED triage.  The history is provided by the patient and medical records.  Mental Health Problem Presenting symptoms: agitation and hallucinations   Degree of incapacity (severity):  Unable to specify Onset quality:  Unable to specify Timing:  Unable to specify Progression:  Unable to specify      Home Medications Prior to Admission medications   Medication Sig Start Date End Date Taking? Authorizing Provider  allopurinol (ZYLOPRIM) 100 MG tablet Take 0.5 tablets (50 mg total) by mouth daily. 06/04/21   Dickie La, MD  blood glucose meter kit and supplies Dispense based on patient and insurance preference. Use up to four times daily as directed. (FOR ICD-10 E10.9, E11.9). Patient not taking: Reported on 08/22/2021 09/17/19   Harold Hedge, MD  Blood Glucose Monitoring Suppl [facility] VERIO) w/Device KIT 1 kit by Does not apply route as directed. Use up to four times daily as directed. (FOR  ICD-10 E10.9, E11.9). Patient not taking: Reported on 08/22/2021 09/22/19   Benay Pike, MD  brimonidine [facility]) 0.2 % ophthalmic solution Place 1 drop into both eyes 2 (two) times daily. 07/09/19   [provider]  carvedilol (COREG) 25 MG tablet Take 1 tablet (25 mg total) by mouth 2 (two) times daily with a meal. 02/11/12   Dickie La, MD  cetirizine (ZYRTEC ALLERGY) 10 MG tablet Take 1 tablet (10 mg total) by mouth daily. 08/15/20   Freddi Starr, MD  cyclobenzaprine (FLEXERIL) 5 MG tablet Take 1 tablet (5 mg total) by mouth at bedtime. 09/04/21   Dickie La, MD  dapagliflozin propanediol (FARXIGA) 10 MG TABS tablet Take 1 tablet (10 mg total) by mouth daily before breakfast. 01/02/21   [name], MD  diclofenac Sodium (VOLTAREN) 1 % GEL Apply 2 g topically 4 (four) times daily. 03/03/20   Faustino Congress, NP  dorzolamide-timolol (COSOPT) 22.3-6.8 MG/ML ophthalmic solution Place 1 drop into both eyes 2 (two) times daily. 08/12/19   [provider]  ELIQUIS 5 MG TABS tablet Take 1 tablet (5 mg total) by mouth 2 (two) times daily. 04/26/21   [name], MD  EPINEPHRINE 0.3 mg/0.3 mL IJ SOAJ injection Inject 0.3 mg into the muscle as needed for anaphylaxis. 10/10/20   Dickie La, MD  FLOVENT HFA 110 MCG/ACT inhaler Inhale 2 puffs into the lungs 2 (two) times daily. 08/06/21   Freddi Starr, MD  fluticasone (FLONASE) 50 MCG/ACT nasal spray Place  2 sprays into both nostrils daily. 08/15/20   Freddi Starr, MD  furosemide (LASIX) 40 MG tablet Take 1 tablet (40 mg total) by mouth as directed. 10/16/20   Lenoria Chime, MD  glimepiride (AMARYL) 4 MG tablet Take 4 mg by mouth daily. 01/28/20   [provider]  glucose blood (ONETOUCH VERIO) test strip Use as instructed. Use up to four times daily as directed. (FOR ICD-10 E10.9, E11.9). Patient not taking: Reported on 08/22/2021 09/22/19   Benay Pike, MD  HYDROcodone-acetaminophen (NORCO/VICODIN)  5-325 MG tablet Take one by mouth up to 4 times a day prn knee pain and may take additional one pill intermittently for max of 5 tabs a day 09/04/21   Dickie La, MD  ipratropium (ATROVENT) 0.03 % nasal spray Place 2 sprays into both nostrils every 12 (twelve) hours. 08/15/20   Freddi Starr, MD  isosorbide mononitrate (ISMO) 20 MG tablet TAKE ONE TABLET BY MOUTH TWICE DAILY 05/14/21   Dickie La, MD  LANTUS SOLOSTAR 100 UNIT/ML Solostar Pen Inject 25 units into skin once daily 02/26/21   Dickie La, MD  latanoprost (XALATAN) 0.005 % ophthalmic solution Place 1 drop into both eyes every evening. 08/12/19   [provider]  NARCAN 4 MG/0.1ML LIQD nasal spray kit  01/04/20   [provider]  nitroGLYCERIN (NITROSTAT) 0.4 MG SL tablet Place 0.4 mg under the tongue as needed. 01/24/19   [provider]  rosuvastatin (CRESTOR) 10 MG tablet Take 1 tablet (10 mg total) by mouth daily. Replaces lipitor 01/29/21   Dickie La, MD  sacubitril-valsartan (ENTRESTO) 49-51 MG Take 1 tablet by mouth 2 (two) times daily.    [provider]  spironolactone (ALDACTONE) 25 MG tablet Take 25 mg by mouth 2 (two) times daily.     [provider]  SURE COMFORT PEN NEEDLES 32G X 6 MM MISC 1 Units by Does not apply route in the morning, at noon, in the evening, and at bedtime. 08/12/21   Dickie La, MD  TRUEPLUS INSULIN SYRINGE 31G X 5/16" 0.3 ML MISC  09/17/19   [provider]  lisinopril (PRINIVIL,ZESTRIL) 40 MG tablet Take 40 mg by mouth 2 (two) times daily.    06/17/11  [provider]      Allergies    Bee pollen and Nsaids    Review of Systems   Review of Systems  Psychiatric/Behavioral:  Positive for agitation and hallucinations.   All other systems reviewed and are negative.   Physical Exam Updated Vital Signs BP (!) 164/100 (BP Location: Right Arm)   Pulse 82   Temp 98.5 F (36.9 C) (Oral)   Resp 20   Ht _0  (1.702 m)   Wt 132.9 kg    SpO2 98%   BMI 45.89 kg/m  Physical Exam Vitals and nursing note reviewed.  Constitutional:      General: He is not in acute distress.    Appearance: He is well-developed. He is obese.  HENT:     Head: Normocephalic and atraumatic.  Eyes:     Conjunctiva/sclera: Conjunctivae normal.     Pupils: Pupils are equal, round, and reactive to light.  Cardiovascular:     Rate and Rhythm: Normal rate and regular rhythm.     Heart sounds: Normal heart sounds.  Pulmonary:     Effort: Pulmonary effort is normal. No respiratory distress.     Breath sounds: Normal breath sounds.  Abdominal:  General: There is no distension.     Palpations: Abdomen is soft.     Tenderness: There is no abdominal tenderness.  Musculoskeletal:        General: No deformity. Normal range of motion.     Cervical back: Normal range of motion and neck supple.  Skin:    General: Skin is warm and dry.  Neurological:     General: No focal deficit present.     Mental Status: He is alert and oriented to person, place, and time.  Psychiatric:     Comments: Mildly agitated, endorses visual hallucinations, apparently left his residence because these hallucinations were bothering him (although patient is clinically blind)  Denies SI or HI     ED Results / Procedures / Treatments   Labs (all labs ordered are listed, but only abnormal results are displayed) Labs Reviewed  COMPREHENSIVE METABOLIC PANEL - Abnormal; Notable for the following components:      Result Value   CO2 21 (*)    Glucose, Bld 127 (*)    Creatinine, Ser 1.81 (*)    Total Bilirubin 1.5 (*)    GFR, Estimated 42 (*)    All other components within normal limits  SALICYLATE LEVEL - Abnormal; Notable for the following components:   Salicylate Lvl <2.5 (*)    All other components within normal limits  ACETAMINOPHEN LEVEL - Abnormal; Notable for the following components:   Acetaminophen (Tylenol), Serum <10 (*)    All other components within  normal limits  CBC - Abnormal; Notable for the following components:   WBC 11.1 (*)    All other components within normal limits  RAPID URINE DRUG SCREEN, HOSP PERFORMED - Abnormal; Notable for the following components:   Opiates POSITIVE (*)    All other components within normal limits  RESP PANEL BY RT-PCR (FLU A&B, COVID) ARPGX2  ETHANOL    EKG None  Radiology No results found.  Procedures Procedures    Medications Ordered in ED Medications  LORazepam (ATIVAN) injection 1 mg (has no administration in time range)    ED Course/ Medical Decision Making/ A&P                           Medical Decision Making Risk Prescription drug management.    Medical Screen Complete  This patient presented to the ED with complaint of visual hallucinations, agitation.  This complaint involves an extensive number of treatment options. The initial differential diagnosis includes, but is not limited to, psychosis, metabolic abnormality, etc.  This presentation is: Acute, Self-Limited, Previously Undiagnosed, Uncertain Prognosis, Complicated, Systemic Symptoms, and Threat to Life/Bodily Function  Patient is presenting with complaint of visual hallucinations (although patient is clinically blind) and agitation.  IVC hold initiated in ED triage  Patient without findings of acute medical pathology on work-up.  Patient is clear for psychiatric evaluation   Additional history obtained:  External records from outside sources obtained and reviewed including prior ED visits and prior Inpatient records.    Lab Tests:  I ordered and personally interpreted labs.  The pertinent results include: CBC, urine tox, CMP, salicylate, acetaminophen, COVID, EtOH  Medicines ordered:  I ordered medication including ativan  for agitation  Reevaluation of the patient after these medicines showed that the patient: improved  Problem List / ED Course:  Hallucinations,  agitation   Reevaluation:  After the interventions noted above, I reevaluated the patient and found that they have: stayed the  same   Disposition:  After consideration of the diagnostic results and the patients response to treatment, I feel that the patent would benefit from psychiatric evaluation.          Final Clinical Impression(s) / ED Diagnoses Final diagnoses:  Visual hallucinations    Rx / DC Orders ED Discharge Orders     None         Valarie Merino, MD 09/13/21 2252

## 2021-09-14 DIAGNOSIS — R441 Visual hallucinations: Secondary | ICD-10-CM | POA: Diagnosis not present

## 2021-09-14 NOTE — Discharge Instructions (Signed)
Call the number listed above to see if you are qualify for additional services that can help you in the future.  Also, see your primary care doctor for checkup next week.

## 2021-09-14 NOTE — ED Notes (Signed)
Discharge instructions reviewed with patient. Patient verbalized understanding of instructions. Follow-up care and medications were reviewed. Patient ambulatory with steady gait. VSS upon discharge.  ?

## 2021-09-14 NOTE — Care Management (Signed)
Services in [place] placed on AVS for services for the blind. Placed  consult to [facility] for reach out regarding eligibility form [employer]. Due to not having medicaid currently he is ineligible for aide/PCS services at this time.

## 2021-09-14 NOTE — ED Notes (Signed)
Spoke with wife, she is calling to get someone to pick patient up.

## 2021-09-14 NOTE — ED Provider Notes (Signed)
Emergency Medicine Observation Re-evaluation Note  [name] is a 61 y.o. male, seen on rounds today.  Pt initially presented to the ED for complaints of Medical Clearance Currently, the patient is resting comfortably.  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  He also complains of right knee swelling from arthritis.  He notes that he has scratches on his legs..  Physical Exam  BP 127/72 (BP Location: Right Arm)   Pulse 78   Temp 97.7 F (36.5 C) (Oral)   Resp 20   Ht 5\' 7"  (1.702 m)   Wt 132.9 kg   SpO2 93%   BMI 45.89 kg/m  Physical Exam General: Obese Cardiac: Normal heart Lungs: Normal respiratory rate Psych: At this time no internal responsiveness.  ED Course / MDM  EKG:   I have reviewed the labs performed to date as well as medications administered while in observation.  Recent changes in the last 24 hours include he has been comfortable.  He has been medically cleared by psychiatry.  Plan  Current plan is for discharge to home where he lives alone, with social services support. [name] is under involuntary commitment.      Adam Bo, MD 09/15/21 1423

## 2021-09-14 NOTE — ED Notes (Signed)
Spoke with patient's wife, the patient's sister is on the way to pick him up.

## 2021-09-14 NOTE — Consult Note (Signed)
Telepsych Consultation   Reason for Consult:  Hallucinations Referring Physician:  Rhae Hammock, PA-C Location of Patient:     [facility] Location of Provider: Other: virtual home office  Patient Identification: [name] MRN:  [id number] Principal Diagnosis: Hallucination, visual Diagnosis:  Principal Problem:   Hallucination, visual   Total Time spent with patient: 30 minutes  Subjective:   [name] is a 61 y.o. male patient admitted for evaluation of visual hallucinations; pt is visually impaired.  HPI:   Patient seen via telepsych by this provider; chart reviewed and consulted with Dr. Dwyane Dee on 09/14/21.  On evaluation [name] reports he is ready to go home, states, "there's nothing wrong with me." Regarding HPI, patient states he went outside to clear his head, fell and had difficulty returning home. States he had knee surgery and sometimes his knee gives out on him. He is blind, reports it took a few hours before he was able to get home.  He states he did not sleep outside all night; when asked how he keeps track of time with his visual impairment, pt states he uses his radio.  Based on this, he reports being outside "no longer than 2 hours" is adamant about this.  Regarding hallucinations, he reports chronic visual hallucinations that started in 2014 when his vision became impaired.  Since then has had seen images of people wearing uniforms, occurring intermittently and no triggers identified.  He denies illicit drug use and no alcohol.  Pt lives alone but states his sister and her husband lives across the street from him and helps often.  He does not have a home health aide, states he previously applied and was denied.   Pt already medically cleared.    Past Psychiatric History: denies  Risk to Self:  no Risk to Others:  no Prior Inpatient Therapy:   Prior Outpatient Therapy:    Past Medical History:  Past Medical History:  Diagnosis Date    Arthritis    Arthritis of knee, degenerative 10/03/2011   Right > LEFT KNEE oa X-rays were not standing but showed some mild sclerosis and joint space loss of the medial compartment of the right knee. History of arthroscopy right knee. Report of injury as a child but unclear exactly what that was.  :I am aware of 12/01/18 Rx by Dr. Nori Riis.  While wife was hospitalized, narcotic Rx went missing when in-home sitter left.  To my knowledge, this is the first mi   Automatic implantable cardioverter-defibrillator in situ 07/15/2013   Blind in both eyes 06/15/2013   Right Sees Dr Katy Fitch   Cataracts, bilateral    right   Chronic gout due to renal impairment of multiple sites without tophus 0/10/6806   Chronic systolic heart failure ([place]) 02/18/2007   Qualifier: Diagnosis of  By: Unk Lightning MD, Tivis Ringer    Congestive heart failure ([place])    a. s/p MDT single chamber ICD    Diabetes mellitus    does not check blood sugars at home   Diverticulosis    Encounter for chronic pain management 05/17/2014   Indication for chronic opioid: severe arthritis RIGHTknee / low back Medication and dose: hydrocodone---uses prn # pills per month: gets #120 pills to last TWO months Last UDS date: 05/17/2014 Pain contract signed (Y/N): Y 05/17/2014 Date narcotic database last reviewed (include red flags): 05/17/2014    Glaucoma    bilateral   Glaucoma 11/07/2015   Severe See Dr. Katy Fitch  H/O TIA (transient ischemic attack) and stroke    Hyperlipidemia    Hyperlipidemia with target low density lipoprotein (LDL) cholesterol less than 100 mg/dL 08/20/2011   Hypertension    HYPERTENSION, BENIGN 02/18/2007   Qualifier: Diagnosis of  By: Nori Riis MD, Drema Pry of both feet 04/12/2020   NICM (nonischemic cardiomyopathy) ([place]) 05/30/2015   Obesity    OBESITY, MORBID 05/21/2010   Qualifier: Diagnosis of  By: Caryl Comes, MD, Remus Blake    Obstructive sleep apnea 03/27/2009   Qualifier: Diagnosis of  By: Unk Lightning, RN, BSN, Melanie      Paroxysmal atrial fibrillation ([place]) 09/21/2019   Paroxysmal VT ([place]) 06/22/2019   RENAL DISEASE, CHRONIC, STAGE III 02/18/2007   SHOULDER PAIN, LEFT 08/01/2009   History of left shoulder surgery.     Single implantable cardioverter-defibrillator (ICD) in situ 06/17/2011   Type 2 diabetes mellitus with stage 4 chronic kidney disease, without long-term current use of insulin ([place]) 08/25/2007    Past Surgical History:  Procedure Laterality Date   CARDIAC CATHETERIZATION  10/24/2003   EF of 45-50%   CARDIAC DEFIBRILLATOR PLACEMENT  2008   CATARACT EXTRACTION  2012   right   EP IMPLANTABLE DEVICE N/A 05/30/2015   Procedure: ICD Generator Changeout;  Surgeon: [name], MD;  Location: [facility];  Service: Cardiovascular;  Laterality: N/A;   KNEE SURGERY Right 2000   SHOULDER SURGERY  2012   left   Family History:  Family History  Problem Relation Age of Onset   Colon cancer Father    Esophageal cancer Neg Hx    Rectal cancer Neg Hx    Stomach cancer Neg Hx    Family Psychiatric  History: unknown Social History:  Social History   Substance and Sexual Activity  Alcohol Use No     Social History   Substance and Sexual Activity  Drug Use No    Social History   Socioeconomic History   Marital status: Legally Separated    Spouse name: Not on file   Number of children: Not on file   Years of education: Not on file   Highest education level: Not on file  Occupational History   Not on file  Tobacco Use   Smoking status: Never   Smokeless tobacco: Never  Vaping Use   Vaping Use: Never used  Substance and Sexual Activity   Alcohol use: No   Drug use: No   Sexual activity: Not on file  Other Topics Concern   Not on file  Social History Narrative   Not on file   Social Determinants of Health   Financial Resource Strain: Not on file  Food Insecurity: Not on file  Transportation Needs: Not on file  Physical Activity: Not on file  Stress: Not on file   Social Connections: Not on file   Additional Social History:    Allergies:   Allergies  Allergen Reactions   Bee Pollen Anaphylaxis, Itching and Swelling   Shellfish Allergy     gout   Nsaids     CKD and CHF    Labs:  Results for orders placed or performed during the hospital encounter of 09/13/21 (from the past 48 hour(s))  Rapid urine drug screen (hospital performed)     Status: Abnormal   Collection Time: 09/13/21  5:12 PM  Result Value Ref Range   Opiates POSITIVE (A) NONE DETECTED   Cocaine NONE DETECTED NONE DETECTED   Benzodiazepines NONE DETECTED  NONE DETECTED   Amphetamines NONE DETECTED NONE DETECTED   Tetrahydrocannabinol NONE DETECTED NONE DETECTED   Barbiturates NONE DETECTED NONE DETECTED    Comment: (NOTE) DRUG SCREEN FOR MEDICAL PURPOSES ONLY.  IF CONFIRMATION IS NEEDED FOR ANY PURPOSE, NOTIFY LAB WITHIN 5 DAYS.  LOWEST DETECTABLE LIMITS FOR URINE DRUG SCREEN Drug Class                     Cutoff (ng/mL) Amphetamine and metabolites    1000 Barbiturate and metabolites    200 Benzodiazepine                 536 Tricyclics and metabolites     300 Opiates and metabolites        300 Cocaine and metabolites        300 THC                            50 Performed at [facility], [place] [address] [place], [place] [address]   Comprehensive metabolic panel     Status: Abnormal   Collection Time: 09/13/21  5:23 PM  Result Value Ref Range   Sodium 142 135 - 145 mmol/L   Potassium 3.8 3.5 - 5.1 mmol/L   Chloride 109 98 - 111 mmol/L   CO2 21 (L) 22 - 32 mmol/L   Glucose, Bld 127 (H) 70 - 99 mg/dL    Comment: Glucose reference range applies only to samples taken after fasting for at least 8 hours.   BUN 13 8 - 23 mg/dL   Creatinine, Ser 1.81 (H) 0.61 - 1.24 mg/dL   Calcium 9.0 8.9 - 10.3 mg/dL   Total Protein 7.5 6.5 - 8.1 g/dL   Albumin 3.9 3.5 - 5.0 g/dL   AST 20 15 - 41 U/L   ALT 10 0 - 44 U/L   Alkaline Phosphatase 63 38 - 126 U/L   Total  Bilirubin 1.5 (H) 0.3 - 1.2 mg/dL   GFR, Estimated 42 (L) >60 mL/min    Comment: (NOTE) Calculated using the CKD-EPI Creatinine Equation (2021)    Anion gap 12 5 - 15    Comment: Performed at [facility] [address] [place], [place] [address]  Ethanol     Status: None   Collection Time: 09/13/21  5:23 PM  Result Value Ref Range   Alcohol, Ethyl (B) <10 <10 mg/dL    Comment: (NOTE) Lowest detectable limit for serum alcohol is 10 mg/dL.  For medical purposes only. Performed at [facility], [place] [address] [place], [place] [address]   Salicylate level     Status: Abnormal   Collection Time: 09/13/21  5:23 PM  Result Value Ref Range   Salicylate Lvl <7.5 (L) 7.0 - 30.0 mg/dL    Comment: Performed at [facility] [address] [place], [place] [address]  Acetaminophen level     Status: Abnormal   Collection Time: 09/13/21  5:23 PM  Result Value Ref Range   Acetaminophen (Tylenol), Serum <10 (L) 10 - 30 ug/mL    Comment: (NOTE) Therapeutic concentrations vary significantly. A range of 10-30 ug/mL  may be an effective concentration for many patients. However, some  are best treated at concentrations outside of this range. Acetaminophen concentrations >150 ug/mL at 4 hours after ingestion  and >50 ug/mL at 12 hours after ingestion are often associated with  toxic reactions.  Performed  at [facility], [place] [address], [place], [place] [address]   cbc     Status: Abnormal   Collection Time: 09/13/21  5:23 PM  Result Value Ref Range   WBC 11.1 (H) 4.0 - 10.5 K/uL   RBC 5.62 4.22 - 5.81 MIL/uL   Hemoglobin 16.2 13.0 - 17.0 g/dL   HCT 48.4 39.0 - 52.0 %   MCV 86.1 80.0 - 100.0 fL   MCH 28.8 26.0 - 34.0 pg   MCHC 33.5 30.0 - 36.0 g/dL   RDW 15.1 11.5 - 15.5 %   Platelets 213 150 - 400 K/uL   nRBC 0.0 0.0 - 0.2 %    Comment: Performed at [facility], [place] [address], [place], [place] [address]  Resp Panel by RT-PCR (Flu A&B, Covid) Anterior  Nasal Swab     Status: None   Collection Time: 09/13/21  7:39 PM   Specimen: Anterior Nasal Swab  Result Value Ref Range   SARS Coronavirus 2 by RT PCR NEGATIVE NEGATIVE    Comment: (NOTE) SARS-CoV-2 target nucleic acids are NOT DETECTED.  The SARS-CoV-2 RNA is generally detectable in upper respiratory specimens during the acute phase of infection. The lowest concentration of SARS-CoV-2 viral copies this assay can detect is 138 copies/mL. A negative result does not preclude SARS-Cov-2 infection and should not be used as the sole basis for treatment or other patient management decisions. A negative result may occur with  improper specimen collection/handling, submission of specimen other than nasopharyngeal swab, presence of viral mutation(s) within the areas targeted by this assay, and inadequate number of viral copies(<138 copies/mL). A negative result must be combined with clinical observations, patient history, and epidemiological information. The expected result is Negative.  Fact Sheet for Patients:  EntrepreneurPulse.com.au  Fact Sheet for Healthcare Providers:  IncredibleEmployment.be  This test is no t yet approved or cleared by the [place] FDA and  has been authorized for detection and/or diagnosis of SARS-CoV-2 by FDA under an Emergency Use Authorization (EUA). This EUA will remain  in effect (meaning this test can be used) for the duration of the COVID-19 declaration under Section 564(b)(1) of the Act, 21 U.S.C.section 360bbb-3(b)(1), unless the authorization is terminated  or revoked sooner.       Influenza A by PCR NEGATIVE NEGATIVE   Influenza B by PCR NEGATIVE NEGATIVE    Comment: (NOTE) The Xpert Xpress SARS-CoV-2/FLU/RSV plus assay is intended as an aid in the diagnosis of influenza from Nasopharyngeal swab specimens and should not be used as a sole basis for treatment. Nasal washings and aspirates are unacceptable  for Xpert Xpress SARS-CoV-2/FLU/RSV testing.  Fact Sheet for Patients: EntrepreneurPulse.com.au  Fact Sheet for Healthcare Providers: IncredibleEmployment.be  This test is not yet approved or cleared by the [place] FDA and has been authorized for detection and/or diagnosis of SARS-CoV-2 by FDA under an Emergency Use Authorization (EUA). This EUA will remain in effect (meaning this test can be used) for the duration of the COVID-19 declaration under Section 564(b)(1) of the Act, 21 U.S.C. section 360bbb-3(b)(1), unless the authorization is terminated or revoked.  Performed at [facility], [place] [address], [place], [place] [address]   CBG monitoring, ED     Status: Abnormal   Collection Time: 09/13/21 11:26 PM  Result Value Ref Range   Glucose-Capillary 133 (H) 70 - 99 mg/dL    Comment: Glucose reference range applies only to samples taken after fasting for at least 8  hours.   Comment 1 Notify RN    Comment 2 Document in Chart     Medications:  Current Facility-Administered Medications  Medication Dose Route Frequency Provider Last Rate Last Admin   allopurinol (ZYLOPRIM) tablet 50 mg  50 mg Oral Daily Valarie Merino, MD   50 mg at 09/14/21 1001   apixaban (ELIQUIS) tablet 5 mg  5 mg Oral BID Valarie Merino, MD   5 mg at 09/14/21 1004   brimonidine (ALPHAGAN) 0.2 % ophthalmic solution 1 drop  1 drop Both Eyes BID Valarie Merino, MD   1 drop at 09/14/21 1018   carvedilol (COREG) tablet 25 mg  25 mg Oral BID WC Valarie Merino, MD   25 mg at 09/14/21 0844   dapagliflozin propanediol (FARXIGA) tablet 10 mg  10 mg Oral QAC breakfast Valarie Merino, MD   10 mg at 09/14/21 0844   dorzolamide-timolol (COSOPT) 22.3-6.8 MG/ML ophthalmic solution 1 drop  1 drop Both Eyes BID Valarie Merino, MD   1 drop at 09/14/21 1017   fluticasone (FLOVENT HFA) 110 MCG/ACT inhaler 2 puff  2 puff Inhalation BID Valarie Merino, MD       furosemide  (LASIX) tablet 40 mg  40 mg Oral Daily PRN Valarie Merino, MD       glimepiride (AMARYL) tablet 4 mg  4 mg Oral Q breakfast Valarie Merino, MD   4 mg at 09/14/21 0844   insulin glargine-yfgn (SEMGLEE) injection 25 Units  25 Units Subcutaneous QHS Valarie Merino, MD   25 Units at 09/14/21 0003   ipratropium (ATROVENT) 0.06 % nasal spray 1 spray  1 spray Each Nare Q12H Valarie Merino, MD       isosorbide mononitrate (ISMO) tablet 20 mg  20 mg Oral BID Valarie Merino, MD   20 mg at 09/14/21 1018   latanoprost (XALATAN) 0.005 % ophthalmic solution 1 drop  1 drop Both Eyes QPM Valarie Merino, MD   1 drop at 09/14/21 0006   rosuvastatin (CRESTOR) tablet 10 mg  10 mg Oral Daily Valarie Merino, MD   10 mg at 09/14/21 1004   sacubitril-valsartan (ENTRESTO) 49-51 mg per tablet  1 tablet Oral BID Valarie Merino, MD   1 tablet at 09/14/21 1017   Current Outpatient Medications  Medication Sig Dispense Refill   allopurinol (ZYLOPRIM) 100 MG tablet Take 0.5 tablets (50 mg total) by mouth daily. 45 tablet 3   blood glucose meter kit and supplies Dispense based on patient and insurance preference. Use up to four times daily as directed. (FOR ICD-10 E10.9, E11.9). 1 each 0   Blood Glucose Monitoring Suppl (ONETOUCH VERIO) w/Device KIT 1 kit by Does not apply route as directed. Use up to four times daily as directed. (FOR ICD-10 E10.9, E11.9). 1 kit 3   brimonidine (ALPHAGAN) 0.2 % ophthalmic solution Place 1 drop into both eyes 2 (two) times daily.     carvedilol (COREG) 25 MG tablet Take 1 tablet (25 mg total) by mouth 2 (two) times daily with a meal.     cetirizine (ZYRTEC ALLERGY) 10 MG tablet Take 1 tablet (10 mg total) by mouth daily. (Patient taking differently: Take 10 mg by mouth daily as needed for allergies.) 30 tablet 6   cyclobenzaprine (FLEXERIL) 5 MG tablet Take 1 tablet (5 mg total) by mouth at bedtime. (Patient taking differently: Take 5 mg by mouth at bedtime as needed for muscle  spasms.) 30 tablet 1   dapagliflozin propanediol (FARXIGA) 10 MG TABS tablet Take 1 tablet (10 mg total) by mouth daily before breakfast. 90 tablet 3   diclofenac Sodium (VOLTAREN) 1 % GEL Apply 2 g topically 4 (four) times daily. (Patient taking differently: Apply 2 g topically 4 (four) times daily as needed (right knee pain).) 100 g 0   dorzolamide-timolol (COSOPT) 22.3-6.8 MG/ML ophthalmic solution Place 1 drop into both eyes 2 (two) times daily.     ELIQUIS 5 MG TABS tablet Take 1 tablet (5 mg total) by mouth 2 (two) times daily. 180 tablet 1   EPINEPHRINE 0.3 mg/0.3 mL IJ SOAJ injection Inject 0.3 mg into the muscle as needed for anaphylaxis. 2 each 1   FLOVENT HFA 110 MCG/ACT inhaler Inhale 2 puffs into the lungs 2 (two) times daily. 12 g 0   furosemide (LASIX) 40 MG tablet Take 1 tablet (40 mg total) by mouth as directed. (Patient taking differently: Take 40 mg by mouth daily as needed for fluid or edema.) 90 tablet 0   glimepiride (AMARYL) 4 MG tablet Take 4 mg by mouth daily.     glucose blood (ONETOUCH VERIO) test strip Use as instructed. Use up to four times daily as directed. (FOR ICD-10 E10.9, E11.9). 100 each 12   HYDROcodone-acetaminophen (NORCO/VICODIN) 5-325 MG tablet Take one by mouth up to 4 times a day prn knee pain and may take additional one pill intermittently for max of 5 tabs a day (Patient taking differently: Take 1 tablet by mouth in the morning and at bedtime.) 150 tablet 0   ipratropium (ATROVENT) 0.03 % nasal spray Place 2 sprays into both nostrils every 12 (twelve) hours. 30 mL 12   isosorbide mononitrate (ISMO) 20 MG tablet TAKE ONE TABLET BY MOUTH TWICE DAILY (Patient taking differently: Take 20 mg by mouth 2 (two) times daily at 10 AM and 5 PM.) 180 tablet 3   LANTUS SOLOSTAR 100 UNIT/ML Solostar Pen Inject 25 units into skin once daily (Patient taking differently: Inject 25 Units into the skin at bedtime.) 27 mL 3   latanoprost (XALATAN) 0.005 % ophthalmic solution  Place 1 drop into both eyes every evening.     NARCAN 4 MG/0.1ML LIQD nasal spray kit Place 0.4 mg into the nose as needed (accidental overdose).     nitroGLYCERIN (NITROSTAT) 0.4 MG SL tablet Place 0.4 mg under the tongue every 5 (five) minutes as needed for chest pain.     rosuvastatin (CRESTOR) 10 MG tablet Take 1 tablet (10 mg total) by mouth daily. Replaces lipitor 90 tablet 3   sacubitril-valsartan (ENTRESTO) 49-51 MG Take 1 tablet by mouth 2 (two) times daily.     SURE COMFORT PEN NEEDLES 32G X 6 MM MISC 1 Units by Does not apply route in the morning, at noon, in the evening, and at bedtime. 100 each 1   TRUEPLUS INSULIN SYRINGE 31G X 5/16" 0.3 ML MISC      fluticasone (FLONASE) 50 MCG/ACT nasal spray Place 2 sprays into both nostrils daily. (Patient not taking: Reported on 09/13/2021) 16 g 2    Musculoskeletal: pt moves all extremities and ambulates independently.  Strength & Muscle Tone: within normal limits Gait & Station:  did not observe standing Patient leans: N/A  Psychiatric Specialty Exam:  Presentation  General Appearance: Appropriate for Environment; Casual  Eye Contact:None; Other (comment) (pt is blind)  Speech:Clear and Coherent; Normal Rate  Speech Volume:Normal  Handedness:Right   Mood and Affect  Mood:Euthymic  Affect:Appropriate; Congruent   Thought Process  Thought Processes:Coherent; Goal Directed  Descriptions of Associations:Circumstantial  Orientation:Full (Time, Place and Person)  Thought Content:Logical  History of Schizophrenia/Schizoaffective disorder:No  Duration of Psychotic Symptoms:No data recorded Hallucinations:Hallucinations: Visual (pt endorses chronic visual hallucinations that started in 2014, occurs intermittently no new or worse since onset) Description of Visual Hallucinations: pt reports seeing people in [place] of Reference:None  Suicidal Thoughts:Suicidal Thoughts: No  Homicidal Thoughts:Homicidal  Thoughts: No   Sensorium  Memory:Immediate Good; Recent Good; Remote Good  Judgment:Good  Insight:Good   Executive Functions  Concentration:Good  Attention Span:Good  [place] of Knowledge:Good  Language:Good   Psychomotor Activity  Psychomotor Activity:Psychomotor Activity: Normal   Assets  Assets:Communication Skills; Desire for Improvement; Housing; Resilience; Financial Resources/Insurance; Social Support   Sleep  Sleep:Sleep: Good Number of Hours of Sleep: 8    Physical Exam: Physical Exam Constitutional:      Appearance: Normal appearance.  Cardiovascular:     Rate and Rhythm: Normal rate.     Pulses: Normal pulses.  Pulmonary:     Effort: Pulmonary effort is normal.  Musculoskeletal:     Cervical back: Normal range of motion.  Neurological:     General: No focal deficit present.     Mental Status: He is alert and oriented to person, place, and time.  Psychiatric:        Attention and Perception: Attention and perception normal.        Mood and Affect: Mood and affect normal.        Speech: Speech normal.        Behavior: Behavior normal. Behavior is cooperative.        Thought Content: Thought content normal.        Cognition and Memory: Cognition and memory normal.        Judgment: Judgment normal.    Review of Systems  Constitutional: Negative.   HENT: Negative.    Eyes:        Patient is visually impaired since 2014  Respiratory: Negative.    Gastrointestinal: Negative.   Genitourinary: Negative.   Musculoskeletal: Negative.   Skin: Negative.   Neurological: Negative.   Psychiatric/Behavioral:  Negative for hallucinations (chronic and intermittent but negative at the time of assessment).    Blood pressure 127/72, pulse 78, temperature 97.7 F (36.5 C), temperature source Oral, resp. rate 20, height 5' 7"  (1.702 m), weight 132.9 kg, SpO2 93 %. Body mass index is 45.89 kg/m.  Treatment Plan Summary: Patient is alert and  oriented x4; pt has chronic hallucinations but no longer endorsing hallucinations today. He is clear and coherent and does not appear psychotic.  Pt denies suicidal or homicidal ideations.  He is psych cleared; he does not have history of mental illness and declines need for outpatient psychiatric referral.    Patient is legally blind and would benefit from home health aide; states he was told his insurance will not pay for this.  Asking SW for assistance to see if pt qualifies for other services.  Disposition: No evidence of imminent risk to self or others at present.   Patient does not meet criteria for psychiatric inpatient admission. Supportive therapy provided about ongoing stressors. Discussed crisis plan, support from social network, calling 911, coming to the Emergency Department, and calling Suicide Hotline.  This service was provided via telemedicine using a 2-way, interactive audio and video technology.  Names of all persons participating in this telemedicine service and  their role in this encounter. Name: [name] Role: Patient  Name: Merlyn Lot Role: PMHNP    Mallie Darting, NP 09/14/2021 12:44 PM

## 2021-09-16 ENCOUNTER — Other Ambulatory Visit: Payer: Self-pay | Admitting: Pulmonary Disease

## 2021-09-16 DIAGNOSIS — R059 Cough, unspecified: Secondary | ICD-10-CM

## 2021-09-18 ENCOUNTER — Ambulatory Visit (INDEPENDENT_AMBULATORY_CARE_PROVIDER_SITE_OTHER): Payer: Medicare Other | Admitting: Family Medicine

## 2021-09-18 ENCOUNTER — Encounter: Payer: Self-pay | Admitting: Family Medicine

## 2021-09-18 ENCOUNTER — Other Ambulatory Visit: Payer: Self-pay

## 2021-09-18 VITALS — BP 141/88 | HR 65 | Ht 67.0 in | Wt 290.4 lb

## 2021-09-18 DIAGNOSIS — I5022 Chronic systolic (congestive) heart failure: Secondary | ICD-10-CM | POA: Diagnosis not present

## 2021-09-18 DIAGNOSIS — E1122 Type 2 diabetes mellitus with diabetic chronic kidney disease: Secondary | ICD-10-CM

## 2021-09-18 DIAGNOSIS — I428 Other cardiomyopathies: Secondary | ICD-10-CM

## 2021-09-18 DIAGNOSIS — H543 Unqualified visual loss, both eyes: Secondary | ICD-10-CM

## 2021-09-18 DIAGNOSIS — N184 Chronic kidney disease, stage 4 (severe): Secondary | ICD-10-CM

## 2021-09-18 DIAGNOSIS — H5316 Psychophysical visual disturbances: Secondary | ICD-10-CM

## 2021-09-18 LAB — POCT GLYCOSYLATED HEMOGLOBIN (HGB A1C): HbA1c, POC (controlled diabetic range): 6.1 % (ref 0.0–7.0)

## 2021-09-18 MED ORDER — QUETIAPINE FUMARATE 25 MG PO TABS: 25.0000 mg | ORAL_TABLET | Freq: Every day | ORAL | 1 refills | Status: DC

## 2021-09-18 NOTE — Patient Instructions (Signed)
I am starting you on Seroquel.  As we discussed, this medication has many uses.  I am not giving it to you for diagnosis of mental illness as I do not think you have that!  I am giving it to you because it also works for hallucinations that are associated with visual loss.  It is apparent to me that your hallucinations are related to your blindness.  Please start it once at night as soon as possible and let me see you in 2 to 4 weeks.  Please call or MyChart me with any questions.  I am also going to reach out to our social worker to see if she can help you with any additional services.

## 2021-09-19 ENCOUNTER — Telehealth: Payer: Self-pay | Admitting: *Deleted

## 2021-09-19 DIAGNOSIS — H5316 Psychophysical visual disturbances: Secondary | ICD-10-CM | POA: Insufficient documentation

## 2021-09-19 NOTE — Progress Notes (Signed)
    CHIEF COMPLAINT / HPI: #1.  Got upset about something at his house and went up to get some air.  Unfortunately he got turned around and could not find his way back home.  He was seen at the emergency department brought in by EMS for this.  He had fallen down had a lot of scratches and scrapes on him.  During that time they discovered that he also was having some visual hallucinations.  He says these have been going on multiple years pretty much since he started losing his vision.  In the last month or 2 they have gotten particularly bothersome however.  He is always seeing shadows or people walking towards him that are not there but in the last couple of months these seem somewhat more frightening somehow.  It happens during the day and at night.  Causing him a lot of anxiety.   PERTINENT  PMH / PSH: I have reviewed the patient's medications, allergies, past medical and surgical history, smoking status and updated in the EMR as appropriate.   OBJECTIVE:  BP (!) 141/88   Pulse 65   Ht 5\' 7"  (1.702 m)   Wt 290 lb 6.4 oz (131.7 kg)   SpO2 97%   BMI 45.48 kg/m   GENERAL: Well-developed overweight male no acute distress Skin: Multiple scratches bilateral lower extremity on the anterior shins, left upper arm.  All are healing well. HEENT: Bilaterally blind.  Resting nystagmus. ASSESSMENT / PLAN:  We will get some labs to check his chronic heart issues. CCM referral for social work for services for the blind. Reviewed notes from recent ED including psychiatric consult. [name] This is very distressing to him.  Unclear why they have become more troublesome in the last few months.  We will try low-dose Seroquel and see him back in 3 weeks.  Discussed this at length with him.   [name] MD

## 2021-09-19 NOTE — Chronic Care Management (AMB) (Signed)
  Care Management   Note  09/19/2021 Name: [name] MRN: [id number] DOB: 07/15/1960  [name] is a 61 y.o. year old male who is a primary care patient of Dickie La, MD. I reached out to Dixon Boos by phone today offer care coordination services.   [name] was given information about care management services today including:  Care management services include personalized support from designated clinical staff supervised by his physician, including individualized plan of care and coordination with other care providers 24/7 contact phone numbers for assistance for urgent and routine care needs. The patient may stop care management services at any time by phone call to the office staff.  Patient agreed to services and verbal consent obtained.   Follow up plan: Telephone appointment with care management team member scheduled for:10/09/21  [place] [facility]  Direct Dial: [phone]

## 2021-09-19 NOTE — Assessment & Plan Note (Signed)
This is very distressing to him.  Unclear why they have become more troublesome in the last few months.  We will try low-dose Seroquel and see him back in 3 weeks.  Discussed this at length with him.

## 2021-09-20 ENCOUNTER — Telehealth: Payer: Self-pay

## 2021-09-20 NOTE — Telephone Encounter (Signed)
Returned call to wife, per [facility] as requested by voice message.  She stated patient was in hospital for behavioral health in the last week and is temporarily staying with his sister.  The monitor is not at his temporary location but pt should be back home soon.  Advised to have monitory by bedside so remote transmissions can be automatic.  Pt is blind and does not have assistance with remote transmissions when he stays at home alone.  She said she will make sure the family knows to have monitor by bedside for automatic transmissions.

## 2021-09-26 ENCOUNTER — Other Ambulatory Visit: Payer: Self-pay | Admitting: Pulmonary Disease

## 2021-09-26 DIAGNOSIS — R059 Cough, unspecified: Secondary | ICD-10-CM

## 2021-10-01 ENCOUNTER — Other Ambulatory Visit: Payer: Self-pay

## 2021-10-01 DIAGNOSIS — M170 Bilateral primary osteoarthritis of knee: Secondary | ICD-10-CM

## 2021-10-01 DIAGNOSIS — G8929 Other chronic pain: Secondary | ICD-10-CM

## 2021-10-01 MED ORDER — HYDROCODONE-ACETAMINOPHEN 5-325 MG PO TABS: ORAL_TABLET | ORAL | 0 refills | Status: DC

## 2021-10-09 ENCOUNTER — Ambulatory Visit: Payer: Medicare Other | Admitting: Licensed Clinical Social Worker

## 2021-10-09 NOTE — Chronic Care Management (AMB) (Signed)
  Care Management   Social Work Visit Note  10/09/2021 Name: [name] MRN: [id number] DOB: December 26, 1960  [name] is a 61 y.o. year old male who sees Dickie La, MD for primary care. The care management team was consulted for assistance with care management and care coordination needs related to [facility] Resources    Patient was given the following information about care management and care coordination services today, agreed to services, and gave verbal consent: 1.care management/care coordination services include personalized support from designated clinical staff supervised by their physician, including individualized plan of care and coordination with other care providers 2. 24/7 contact phone numbers for assistance for urgent and routine care needs. 3. The patient may stop care management/care coordination services at any time by phone call to the office staff.  Engaged with patient by telephone for initial visit in response to provider referral for social work chronic care management and care coordination services.  Assessment: Review of patient history, allergies, and health status during evaluation of patient need for care management/care coordination services.    Interventions:  Patient interviewed and appropriate assessments performed Collaborated with clinical team regarding patient needs  Unsuccessful call to patient. SW successful spoke with Patients wife. SW advised she received a referral to assist with services for the blind. Patient was not available to talk at the schedule time. SW left a detailed VM with patient including SW contact information. Patient advised to contact SW if further assistance is needed.   SDOH (Social Determinants of Health) assessments performed: No     Plan: Patient will contact SW or PCP if further assistance is needed.   Lenor Derrick , MSW Social Worker [facility] Care Management  [phone]

## 2021-10-09 NOTE — Patient Instructions (Signed)
Visit Information  Instructions:   Patient was given the following information about care management and care coordination services today, agreed to services, and gave verbal consent: 1.care management/care coordination services include personalized support from designated clinical staff supervised by their physician, including individualized plan of care and coordination with other care providers 2. 24/7 contact phone numbers for assistance for urgent and routine care needs. 3. The patient may stop care management/care coordination services at any time by phone call to the office staff.  Patient verbalizes understanding of instructions and care plan provided today and agrees to view in MyChart. Active MyChart status and patient understanding of how to access instructions and care plan via MyChart confirmed with patient.     No further follow up required: .  [name], BSW , MSW Social Worker [facility] Care Management  [phone]      

## 2021-10-15 ENCOUNTER — Other Ambulatory Visit: Payer: Self-pay | Admitting: Pulmonary Disease

## 2021-10-15 ENCOUNTER — Other Ambulatory Visit: Payer: Self-pay | Admitting: Family Medicine

## 2021-10-15 DIAGNOSIS — R059 Cough, unspecified: Secondary | ICD-10-CM

## 2021-10-21 ENCOUNTER — Other Ambulatory Visit: Payer: Self-pay | Admitting: Internal Medicine

## 2021-10-21 ENCOUNTER — Telehealth: Payer: Self-pay

## 2021-10-21 NOTE — Telephone Encounter (Signed)
Spoke with wife.  She reported she has been trying to send remote transmission to review fluid levels since yesterday but the monitor is not working.  She is very concerned for patient.  Pt currently lives with other family members but she is still helping take care of the patient. She visited patient at home this morning and his legs are extremely swollen from knees to toes.  Pt unable to wear shoes and elastic at the bottom of sweat pants are stretched out as far as can go.  She reports she spoke with [name] this AM and was told to have patient take extra fluid pills today and tomorrow. She stated she has never seen his legs this swollen and does not think a couple of extra fluid pills will resolve the issue.  Advised to take patient to ER to be evaluated for fluid overload if needed.  She stated she will take patient in the morning as soon as she her daughter picks her up.  Advised if needed can call 911 also to get patient to ER if needed.  Wife has Consult[employer] support number and will call after patient returns home from ER tomorrow.  She will call back to let me know the status of the patient.  She appreciated the call back.

## 2021-10-21 NOTE — Telephone Encounter (Signed)
Prescription refill request for Eliquis received. Indication:Afib Last office visit:5/23 Scr:1.8 Age: 61 Weight:131.7 kg  Prescription refilled

## 2021-10-21 NOTE — Telephone Encounter (Signed)
Pt called stating they are having issues with his monitor. I gave them the number to [place] support because she needed to speak with Sharman Cheek.

## 2021-10-23 ENCOUNTER — Encounter: Payer: Self-pay | Admitting: Family Medicine

## 2021-10-23 ENCOUNTER — Ambulatory Visit (INDEPENDENT_AMBULATORY_CARE_PROVIDER_SITE_OTHER): Payer: Medicare Other | Admitting: Family Medicine

## 2021-10-23 DIAGNOSIS — H5316 Psychophysical visual disturbances: Secondary | ICD-10-CM

## 2021-10-23 MED ORDER — QUETIAPINE FUMARATE 50 MG PO TABS: 50.0000 mg | ORAL_TABLET | Freq: Every day | ORAL | 3 refills | Status: DC

## 2021-10-23 NOTE — Progress Notes (Signed)
    CHIEF COMPLAINT / HPI: Follow-up visual hallucinations and problems sleeping.  We have started him on Seroquel.  Significantly improved the hallucinations.  He is very happy about that.  His sleep is still somewhat disrupted.   PERTINENT  PMH / PSH: I have reviewed the patient's medications, allergies, past medical and surgical history, smoking status and updated in the EMR as appropriate.   OBJECTIVE:  BP 138/90   Pulse (!) 56   Ht 5\' 7"  (1.702 m)   Wt 291 lb 6.4 oz (132.2 kg)   SpO2 99%   BMI 45.64 kg/m  PSYCH: AxOx4. Good eye contact.. No psychomotor retardation or agitation. Appropriate speech fluency and content. Asks and answers questions appropriately. Mood is congruent.   ASSESSMENT / PLAN: We will plan on seeing him back in 2 months.  I would like to keep him at 25 or 50 mg, that is low-dose, Seroquel given his extensive cardiovascular issues.  He does have cardioverter/defibrillator.  [name] Significant improvement in hallucinations visual.  Improvement in sleep as well but he still having frequent awakening.  Wonders if we can increase dose slightly.  Otherwise he is feeling much better.  [name] MD

## 2021-10-23 NOTE — Patient Instructions (Signed)
I have sent in a new prescription for the higher dose of the pill you take at night for sleep.  That is the Seroquel.  You can continue using up the 25 mg dose and then when you run out get the new prescription.  I am glad this is working for you.  I like to see you back in 2 months.

## 2021-10-23 NOTE — Assessment & Plan Note (Addendum)
Significant improvement in hallucinations visual.  Improvement in sleep as well but he still having frequent awakening.  Wonders if we can increase dose slightly.  Otherwise he is feeling much better.

## 2021-10-25 ENCOUNTER — Other Ambulatory Visit: Payer: Self-pay | Admitting: Family Medicine

## 2021-10-25 DIAGNOSIS — M170 Bilateral primary osteoarthritis of knee: Secondary | ICD-10-CM

## 2021-10-25 DIAGNOSIS — G8929 Other chronic pain: Secondary | ICD-10-CM

## 2021-11-04 ENCOUNTER — Telehealth: Payer: Self-pay

## 2021-11-04 NOTE — Telephone Encounter (Signed)
Appeal available. Will submit

## 2021-11-04 NOTE — Telephone Encounter (Signed)
Prior Auth for patients medication HYDROC-ACETA denied by [facility] via CoverMyMeds.   Reason: Your plan provides for a 30 day supply of opioids. [employer] has approved coverage for hydrocodone-acetaminophen 5 mg-325 mg #118 tablets under your Part D benefit for/through 04/06/2022. A longer supply will not be allowed.  CoverMyMeds Key: [id number]

## 2021-11-04 NOTE — Telephone Encounter (Signed)
A Prior Authorization was initiated for this patients HYDROCODONE-ACETAMINOPHEN through CoverMyMeds.   Key: [id number]

## 2021-11-05 NOTE — Telephone Encounter (Signed)
Spoke with pharmacy, medication went through for 150 tabs for 30 day supply on 11/01/21.

## 2021-11-11 ENCOUNTER — Other Ambulatory Visit: Payer: Self-pay | Admitting: Family Medicine

## 2021-11-11 DIAGNOSIS — S39012A Strain of muscle, fascia and tendon of lower back, initial encounter: Secondary | ICD-10-CM

## 2021-11-22 ENCOUNTER — Telehealth: Payer: Self-pay | Admitting: Family Medicine

## 2021-11-22 ENCOUNTER — Telehealth: Payer: Self-pay

## 2021-11-22 MED ORDER — QUETIAPINE FUMARATE 50 MG PO TABS: 100.0000 mg | ORAL_TABLET | Freq: Every day | ORAL | 3 refills | Status: DC

## 2021-11-22 NOTE — Telephone Encounter (Signed)
His wife called triage.  They are currently separated but she still participates in his care.  Told triage she was having hallucinations at night that included auditory hallucinations.  Triage nurse called and spoke with his sister with him he lives.  Auditory hallucinations are new.  Not experiencing any suicidal or homicidal ideation or agitation.  At the time triage spoke with the sister, the voices had stopped.  They are not giving specific message.  Will increase Seroquel to 100 mg nightly.  Currently has 50 mg capsules so 2 of those.  I will work him in on my schedule this coming Wednesday.  Triage also gave him instructions about 24-hour crisis line and crisis center at behavioral medicine.  Interesting that he is now having auditory hallucinations.  Had diagnosed this earlier symptoms of visual hallucinations at night as secondary to his almost total visual loss and he initially responded well to 50 mg of Seroquel.  We will have to continue to evaluate and may need psychiatry consult.  Appreciate triage quick incomplete response.  I spoke with him today and they will call patient with increased dose.

## 2021-11-22 NOTE — Telephone Encounter (Signed)
Received phone call from [facility] regarding patient "being in crisis". She reports that last night patient began hearing voices that were "trying to get at him". She was not with patient at the time of this conversation. She provided me with Mr. Seeley contact number.   Called patient directly. Patient states that he does not want to talk about last night. Patient states that he is feeling fine now. Denies SI, HI. He is not disoriented or hearing voices currently.   Patient is at sister's house now. Spoke with sister. Provided with emergency resources for mental health crisis.   They are requesting increase in Seroquel dosage.   If patient begins hearing voices again or starts having thoughts of self harm/SI/HI sister will take him to the [facility].   Called and spoke with Dr. Nori Riis who advised that Seroquel be increased to 100 mg. She also asked that patient be added to her schedule on Wednesday, 8/23.   Returned call to sister. Informed of medication increase and scheduled for Wednesday, 8/23 at 11:30 am.   Talbot Grumbling, RN

## 2021-11-26 ENCOUNTER — Telehealth: Payer: Self-pay

## 2021-11-26 NOTE — Telephone Encounter (Signed)
[name] calls nurse line in regards to Seroquel.   [name] reports they have not received the new prescription for Seroquel 100mg .   I called [facility] and prescription was never received.   Will forward to PCP to resend in.   [name] notified and reminded of apt tomorrow with PCP at 11:30am.

## 2021-11-27 ENCOUNTER — Other Ambulatory Visit: Payer: Self-pay | Admitting: Family Medicine

## 2021-11-27 ENCOUNTER — Ambulatory Visit (INDEPENDENT_AMBULATORY_CARE_PROVIDER_SITE_OTHER): Payer: Medicare Other | Admitting: Family Medicine

## 2021-11-27 ENCOUNTER — Ambulatory Visit: Payer: Medicare Other | Admitting: Family Medicine

## 2021-11-27 ENCOUNTER — Encounter: Payer: Self-pay | Admitting: Family Medicine

## 2021-11-27 DIAGNOSIS — G8929 Other chronic pain: Secondary | ICD-10-CM

## 2021-11-27 DIAGNOSIS — M170 Bilateral primary osteoarthritis of knee: Secondary | ICD-10-CM

## 2021-11-27 DIAGNOSIS — H5316 Psychophysical visual disturbances: Secondary | ICD-10-CM

## 2021-11-27 MED ORDER — QUETIAPINE FUMARATE 50 MG PO TABS: 150.0000 mg | ORAL_TABLET | Freq: Every day | ORAL | 1 refills | Status: DC

## 2021-11-27 NOTE — Patient Instructions (Signed)
I am increasing the seroquel dose to 150 mg at night. I have called in a higher dose pill so you will be taking THREE of these (the new rx) at bedtime.  Let me see you in 3-4 weeks

## 2021-11-28 ENCOUNTER — Other Ambulatory Visit: Payer: Self-pay

## 2021-11-28 ENCOUNTER — Encounter: Payer: Self-pay | Admitting: Family Medicine

## 2021-11-28 DIAGNOSIS — M79672 Pain in left foot: Secondary | ICD-10-CM

## 2021-11-28 DIAGNOSIS — M109 Gout, unspecified: Secondary | ICD-10-CM

## 2021-11-28 NOTE — Assessment & Plan Note (Addendum)
He is denying auiotory hallucination component Issues are making him anxious Will increase seroquel to 150 qhs. Check EKG next appt in 2 weeks No drug-drug interactions found with entresto or farxiga (micromedex)  Long discussion again about etiology of hallucinations, differentiation between his issues and those related to what he calls "being crazy". Offered t [name] family come in with him and discuss. He will think about this.

## 2021-11-28 NOTE — Progress Notes (Signed)
    CHIEF COMPLAINT / HPI:  F/u sleep and vision issues Initially the seroquel seemed to help both the sleep and the visual hallucinations. His sister called last week and said he was still having issues, also said he was hearing things too. He denies any auditory hallucinations at any time. Says his family does not understand why he is "seeing tjhings" (esp at night). Hesays visions are [name] of people coming toward him, not in a threatening way necessairly and they usually part if he moves toward hem. The visions are frightening for him and he gets anxious at times when he thinks he is going to see them again.  Staying mostly at his brither's house but his sister lives across the street and he has been alternating some as well. He does not think they understand.  Sleep was initialy better and still improved but some recurrence of intermittent awakening.   PERTINENT  PMH / PSH: I have reviewed the patient's medications, allergies, past medical and surgical history, smoking status and updated in the EMR as appropriate.   OBJECTIVE:  BP (!) 140/93   Pulse 70   Ht 5\' 7"  (1.702 m)   Wt 290 lb (131.5 kg)   SpO2 96%   BMI 45.42 kg/m  GEN WD WN NAD HEENT: resting intermittent horizontal nystagmus. Blind botheyes. PSYCH: AxOx4.  No psychomotor retardation or agitation. Appropriate speech fluency and content. Asks and answers questions appropriately. Mood is congruent.   ASSESSMENT / PLAN:   [name] syndrome He is denying auiotory hallucination component Issues are making him anxious Will increase seroquel to 150 qhs. Check EKG next appt in 2 weeks No drug-drug interactions found with entresto or farxiga (micromedex)  Long discussion again about etiology of hallucinations, differentiation between his issues and those related to what he calls "being crazy". Offered t [name] family come in with him and discuss. He will think about this.   Adam Mcmurray MD

## 2021-11-29 ENCOUNTER — Other Ambulatory Visit: Payer: Self-pay | Admitting: Family Medicine

## 2021-11-29 MED ORDER — FUROSEMIDE 40 MG PO TABS
ORAL_TABLET | ORAL | 3 refills | Status: DC
Start: 2021-11-29 — End: 2021-11-29

## 2021-11-29 MED ORDER — DICLOFENAC SODIUM 1 % EX GEL: 2.0000 g | Freq: Four times a day (QID) | CUTANEOUS | Status: DC | PRN

## 2021-11-29 MED ORDER — DICLOFENAC SODIUM 1 % EX GEL: 2.0000 g | Freq: Four times a day (QID) | CUTANEOUS | 2 refills | Status: DC | PRN

## 2021-11-29 MED ORDER — FUROSEMIDE 40 MG PO TABS: ORAL_TABLET | ORAL | 3 refills | Status: DC

## 2021-11-29 NOTE — Addendum Note (Signed)
Addended by: Talbot Grumbling on: 11/29/2021 10:13 AM   Modules accepted: Orders

## 2021-11-29 NOTE — Telephone Encounter (Signed)
Received phone call regarding prescriptions for lasix and voltaren gel.   The lasix prescription was set to print. I resent this electronically. The voltaren gel was set to no print. Attempted to resend electronically, however, dispense quantity was missing.   I am pending the voltaren gel to this encounter. Please add the dispense quantity.   Thanks.   Talbot Grumbling, RN

## 2021-11-29 NOTE — Telephone Encounter (Signed)
Dear [facility] Team It was a Rx for FIFTY mg seroquel and sig was rto take 3 at bedtime. So  there is no rx for a 100 mg tab. Maybe they need to call pharmacy and see if the pharmacy received the 50 mg tab rx. If not, I can call it in but looks like it was sent on the 23rd  Let me know if I need to re-send [facility]! [name]

## 2021-11-29 NOTE — Addendum Note (Signed)
Addended byDorcas Mcmurray L on: 11/29/2021 04:40 PM   Modules accepted: Orders

## 2021-11-29 NOTE — Telephone Encounter (Signed)
Spoke to [name]. She is working with the nurse in our office to get Rx sent to pharmacy again. Rx was not received by pharmacy yesterday. I told pt to check with the pharmacy in 15-30 mins and if the Rx was not received to call the nurse back and inform her. Ottis Stain, CMA

## 2021-12-11 ENCOUNTER — Other Ambulatory Visit: Payer: Self-pay | Admitting: Family Medicine

## 2021-12-11 ENCOUNTER — Ambulatory Visit (INDEPENDENT_AMBULATORY_CARE_PROVIDER_SITE_OTHER): Payer: Medicare Other | Admitting: Family Medicine

## 2021-12-11 ENCOUNTER — Encounter: Payer: Self-pay | Admitting: Family Medicine

## 2021-12-11 ENCOUNTER — Ambulatory Visit (HOSPITAL_COMMUNITY): Payer: Medicare Other | Attending: Family Medicine

## 2021-12-11 VITALS — BP 120/85 | HR 78 | Ht 67.0 in | Wt 290.4 lb

## 2021-12-11 DIAGNOSIS — I480 Paroxysmal atrial fibrillation: Secondary | ICD-10-CM | POA: Diagnosis not present

## 2021-12-11 DIAGNOSIS — I5022 Chronic systolic (congestive) heart failure: Secondary | ICD-10-CM | POA: Insufficient documentation

## 2021-12-11 DIAGNOSIS — M170 Bilateral primary osteoarthritis of knee: Secondary | ICD-10-CM | POA: Diagnosis not present

## 2021-12-11 DIAGNOSIS — Z7901 Long term (current) use of anticoagulants: Secondary | ICD-10-CM

## 2021-12-11 DIAGNOSIS — H5316 Psychophysical visual disturbances: Secondary | ICD-10-CM | POA: Diagnosis not present

## 2021-12-11 DIAGNOSIS — Z79899 Other long term (current) drug therapy: Secondary | ICD-10-CM

## 2021-12-11 DIAGNOSIS — I428 Other cardiomyopathies: Secondary | ICD-10-CM | POA: Diagnosis not present

## 2021-12-11 NOTE — Assessment & Plan Note (Signed)
He is doing quite well with his anticoagulation.  No problems with bleeding.  Says he is very religious about taking medication.

## 2021-12-11 NOTE — Assessment & Plan Note (Addendum)
EKG today showed normal sinus rhythm.  He is on long-term anticoagulation.

## 2021-12-11 NOTE — Assessment & Plan Note (Signed)
Significant improvement on the 150 mg dose and we will continue that.  EKG today which showed QTc of 438.  I will follow him up for this in 3 months, sooner with problems.

## 2021-12-11 NOTE — Patient Instructions (Signed)
You can use CALAMINE LOTION for your lower legs. It is over the counter. Use it once or twice a day.  Let me see you in about 2 months.

## 2021-12-11 NOTE — Progress Notes (Signed)
    CHIEF COMPLAINT / HPI: #1.  Follow-up increase in Seroquel.  Significantly improved.  Still has some occasional visual hallucinations at night but in general they are about 90% better.  He wants to stay at current dose.  Says if it gets bad again he will let me know.  Sleeping better. 2.  Follow-up diabetes mellitus.  Taking his medicines regularly without problem. 3.  Follow-up coronary heart disease: Only issue he is having is some lower extremity edema at times.  He tries to take his Lasix regularly but occasionally he will miss a dose if he is going to be out doing something because he does not want to have to stop and go to the bathroom all the time.  Says his legs will sometimes swell up larger and a weep a little bit.  This causes them to itch and he scratches them.   PERTINENT  PMH / PSH: I have reviewed the patient's medications, allergies, past medical and surgical history, smoking status and updated in the EMR as appropriate.   OBJECTIVE:  BP 120/85   Pulse 78   Ht 5\' 7"  (1.702 m)   Wt 290 lb 6.4 oz (131.7 kg)   SpO2 98%   BMI 45.48 kg/m   GENERAL: Well-developed overweight male no acute distress HEENT: Blind with resting nystagmus EXTREMITY: Bilateral lower extremity has nonpitting edema.  He has some excoriations on both shins but no open sores. PSYCH: AxOx4. Good eye contact.. No psychomotor retardation or agitation. Appropriate speech fluency and content. Asks and answers questions appropriately. Mood is congruent.  ASSESSMENT / PLAN:   Encounter for long-term (current) use of high-risk medication EKG to check QTc  [name] Significant improvement on the 150 mg dose and we will continue that.  EKG today which showed QTc of 438.  I will follow him up for this in 3 months, sooner with problems.  Arthritis of knee, degenerative Continue current medication regimen.  No red flags.  NICM (nonischemic cardiomyopathy) ([place]) He has lower edema that is  fairly well controlled although it sounds like at times he will skip doses and may be he has some increase in his lower extremity edema.  I recommended calamine over-the-counter lotion for his skin.  Recommended more regular dosing of Lasix.  Chronic systolic heart failure (HCC) He seems just slightly fluid up today.  We discussed more regular dosing of his Lasix.  Chronic anticoagulation He is doing quite well with his anticoagulation.  No problems with bleeding.  Says he is very religious about taking medication.  Paroxysmal atrial fibrillation (HCC) EKG today showed normal sinus rhythm.  He is on long-term anticoagulation.   [name] MD

## 2021-12-11 NOTE — Assessment & Plan Note (Signed)
He seems just slightly fluid up today.  We discussed more regular dosing of his Lasix.

## 2021-12-11 NOTE — Assessment & Plan Note (Signed)
Continue current medication regimen.  No red flags.

## 2021-12-11 NOTE — Assessment & Plan Note (Signed)
EKG to check QTc

## 2021-12-11 NOTE — Assessment & Plan Note (Signed)
He has lower edema that is fairly well controlled although it sounds like at times he will skip doses and may be he has some increase in his lower extremity edema.  I recommended calamine over-the-counter lotion for his skin.  Recommended more regular dosing of Lasix.

## 2021-12-12 LAB — COMPREHENSIVE METABOLIC PANEL
ALT: 9 IU/L (ref 0–44)
AST: 13 IU/L (ref 0–40)
Albumin/Globulin Ratio: 1.3 (ref 1.2–2.2)
Albumin: 4.1 g/dL (ref 3.9–4.9)
Alkaline Phosphatase: 67 IU/L (ref 44–121)
BUN/Creatinine Ratio: 8 — ABNORMAL LOW (ref 10–24)
BUN: 14 mg/dL (ref 8–27)
Bilirubin Total: 0.7 mg/dL (ref 0.0–1.2)
CO2: 25 mmol/L (ref 20–29)
Calcium: 9 mg/dL (ref 8.6–10.2)
Chloride: 104 mmol/L (ref 96–106)
Creatinine, Ser: 1.75 mg/dL — ABNORMAL HIGH (ref 0.76–1.27)
Globulin, Total: 3.1 g/dL (ref 1.5–4.5)
Glucose: 69 mg/dL — ABNORMAL LOW (ref 70–99)
Potassium: 4.3 mmol/L (ref 3.5–5.2)
Sodium: 143 mmol/L (ref 134–144)
Total Protein: 7.2 g/dL (ref 6.0–8.5)
eGFR: 43 mL/min/{1.73_m2} — ABNORMAL LOW (ref >59)

## 2021-12-18 ENCOUNTER — Telehealth: Payer: Self-pay

## 2021-12-18 NOTE — Patient Outreach (Signed)
  Care Coordination   12/18/2021 Name: [name] MRN: [id number] DOB: [birth year]   Care Coordination Outreach Attempts:  An unsuccessful telephone outreach was attempted today to offer the patient information about available care coordination services as a benefit of their health plan.   Follow Up Plan:  Additional outreach attempts will be made to offer the patient care coordination information and services.   Encounter Outcome:  No Answer  Care Coordination Interventions Activated:  No   Care Coordination Interventions:  No, not indicated    Jone Baseman, RN, MSN [facility] Care Management Care Management Coordinator Direct Line [phone]

## 2021-12-20 ENCOUNTER — Encounter: Payer: Self-pay | Admitting: Family Medicine

## 2021-12-24 ENCOUNTER — Other Ambulatory Visit: Payer: Self-pay | Admitting: Family Medicine

## 2021-12-26 ENCOUNTER — Telehealth: Payer: Self-pay

## 2021-12-26 ENCOUNTER — Other Ambulatory Visit: Payer: Self-pay | Admitting: Family Medicine

## 2021-12-26 DIAGNOSIS — M170 Bilateral primary osteoarthritis of knee: Secondary | ICD-10-CM

## 2021-12-26 DIAGNOSIS — E119 Type 2 diabetes mellitus without complications: Secondary | ICD-10-CM | POA: Diagnosis not present

## 2021-12-26 DIAGNOSIS — G8929 Other chronic pain: Secondary | ICD-10-CM

## 2021-12-26 DIAGNOSIS — E785 Hyperlipidemia, unspecified: Secondary | ICD-10-CM | POA: Diagnosis not present

## 2021-12-26 DIAGNOSIS — I10 Essential (primary) hypertension: Secondary | ICD-10-CM | POA: Diagnosis not present

## 2021-12-26 NOTE — Patient Instructions (Signed)
Visit Information  Thank you for taking time to visit with me today. Please don't hesitate to contact me if I can be of assistance to you.   Following are the goals we discussed today:   Goals Addressed             This Visit's Progress    COMPLETED: Care Coordination Activities-No follow up required       Care Coordination Interventions: Advised patient to schedule annual wellness visit          OIf you are experiencing a Mental Health or [place] or need someone to talk to, please call the Suicide and Crisis Lifeline: 988   The patient verbalized understanding of instructions, educational materials, and care plan provided today and agreed to receive a mailed copy of patient instructions, educational materials, and care plan.   No further follow up required: patient declined  Jone Baseman, RN, MSN [place] [facility] Care Management Coordinator Direct Line [phone]

## 2021-12-26 NOTE — Patient Outreach (Signed)
  Care Coordination   Initial Visit Note   12/26/2021 Name: [name] MRN: [id number] DOB: 11-22-1960  [name] is a 61 y.o. year old male who sees Dickie La, MD for primary care. I spoke with  Dixon Boos by phone today.  What matters to the patients health and wellness today?  None     Goals Addressed             This Visit's Progress    COMPLETED: Care Coordination Activities-No follow up required       Care Coordination Interventions: Advised patient to schedule annual wellness visit          SDOH assessments and interventions completed:  Yes     Care Coordination Interventions Activated:  Yes   Care Coordination Interventions:  Yes, provided    Follow up plan: No further intervention required.   Encounter Outcome:  Pt. Visit Completed   Jone Baseman, RN, MSN [facility] Care Management Coordinator Direct Line [phone]

## 2022-01-15 ENCOUNTER — Other Ambulatory Visit: Payer: Self-pay | Admitting: Family Medicine

## 2022-01-16 MED ORDER — QUETIAPINE FUMARATE 50 MG PO TABS
150.0000 mg | ORAL_TABLET | Freq: Every day | ORAL | 1 refills | Status: DC
Start: 2022-01-16 — End: 2022-02-21

## 2022-01-21 ENCOUNTER — Other Ambulatory Visit: Payer: Self-pay

## 2022-01-21 DIAGNOSIS — G8929 Other chronic pain: Secondary | ICD-10-CM

## 2022-01-21 DIAGNOSIS — M170 Bilateral primary osteoarthritis of knee: Secondary | ICD-10-CM

## 2022-01-22 MED ORDER — HYDROCODONE-ACETAMINOPHEN 5-325 MG PO TABS: ORAL_TABLET | ORAL | 0 refills | Status: DC

## 2022-02-10 ENCOUNTER — Other Ambulatory Visit: Payer: Self-pay | Admitting: Family Medicine

## 2022-02-10 DIAGNOSIS — S39012A Strain of muscle, fascia and tendon of lower back, initial encounter: Secondary | ICD-10-CM

## 2022-02-12 ENCOUNTER — Ambulatory Visit (INDEPENDENT_AMBULATORY_CARE_PROVIDER_SITE_OTHER): Payer: Medicare Other | Admitting: Family Medicine

## 2022-02-12 ENCOUNTER — Encounter: Payer: Self-pay | Admitting: Family Medicine

## 2022-02-12 VITALS — BP 122/84 | HR 59 | Wt 303.4 lb

## 2022-02-12 DIAGNOSIS — H5316 Psychophysical visual disturbances: Secondary | ICD-10-CM

## 2022-02-12 DIAGNOSIS — Z23 Encounter for immunization: Secondary | ICD-10-CM | POA: Diagnosis not present

## 2022-02-12 DIAGNOSIS — I5022 Chronic systolic (congestive) heart failure: Secondary | ICD-10-CM | POA: Diagnosis not present

## 2022-02-12 DIAGNOSIS — E1122 Type 2 diabetes mellitus with diabetic chronic kidney disease: Secondary | ICD-10-CM

## 2022-02-12 DIAGNOSIS — N184 Chronic kidney disease, stage 4 (severe): Secondary | ICD-10-CM

## 2022-02-12 DIAGNOSIS — Z7901 Long term (current) use of anticoagulants: Secondary | ICD-10-CM | POA: Diagnosis not present

## 2022-02-12 DIAGNOSIS — H543 Unqualified visual loss, both eyes: Secondary | ICD-10-CM | POA: Diagnosis not present

## 2022-02-12 DIAGNOSIS — N1831 Chronic kidney disease, stage 3a: Secondary | ICD-10-CM

## 2022-02-12 LAB — POCT GLYCOSYLATED HEMOGLOBIN (HGB A1C): HbA1c, POC (controlled diabetic range): 6.1 % (ref 0.0–7.0)

## 2022-02-12 NOTE — Patient Instructions (Signed)
I will send you a note about the lab work we are checking today.  We are mostly looking at your kidney function.  You got the flu shot and updated COVID-vaccine today so you are may be a little sore tomorrow.  I like to see him back in January sometime.  I am really proud of you for getting such good control of your blood sugar.  Please call us if you have anything new to happen or need anything between now in January.  Great to see you!

## 2022-02-13 LAB — BASIC METABOLIC PANEL
BUN/Creatinine Ratio: 11 (ref 10–24)
BUN: 18 mg/dL (ref 8–27)
CO2: 26 mmol/L (ref 20–29)
Calcium: 9.1 mg/dL (ref 8.6–10.2)
Chloride: 107 mmol/L — ABNORMAL HIGH (ref 96–106)
Creatinine, Ser: 1.66 mg/dL — ABNORMAL HIGH (ref 0.76–1.27)
Glucose: 98 mg/dL (ref 70–99)
Potassium: 4.5 mmol/L (ref 3.5–5.2)
Sodium: 145 mmol/L — ABNORMAL HIGH (ref 134–144)
eGFR: 46 mL/min/{1.73_m2} — ABNORMAL LOW (ref >59)

## 2022-02-13 NOTE — Assessment & Plan Note (Signed)
A1C today Discussed better diet and more exercise

## 2022-02-13 NOTE — Assessment & Plan Note (Signed)
Continues on DOAC

## 2022-02-13 NOTE — Progress Notes (Signed)
    CHIEF COMPLAINT / HPI:  F/u visual hallucinations. Signififcantly improved on current meds. No problems with meds. Feeling much better and less stressed. 2. Social issues: is getting his own apartment and is pretty excited about that. Will not be that close to his brother and sister geographically however but he is not worried. 3. Has gained some weight. Says he is not watching his diet as he should. Not exercising. 4. LE edema is a tiny bit better. Sister got him some support hose but they onlky come to ankle. He would like some taller ones but not too tight.   PERTINENT  PMH / PSH: I have reviewed the patient's medications, allergies, past medical and surgical history, smoking status and updated in the EMR as appropriate.   OBJECTIVE:  BP 122/84   Pulse (!) 59   Wt (!) 303 lb 6.4 oz (137.6 kg)   SpO2 97%   BMI 47.52 kg/m  Vital signs reviewed. GENERAL: Well-developed, well-nourished, no acute distress. CARDIOVASCULAR: Regular rate and rhythm no murmur gallop or rub LUNGS: Clear to auscultation bilaterally, no rales or wheeze. Fairly distant lung sounds ABDOMEN: Soft positive bowel sounds. No fluid wave. Obese. NEURO:blind with intermittent resting nystagmus. MSK: Movement of extremity x 4. 1+ LE edema to mid shin. No weeping of skin.    ASSESSMENT / PLAN:   Blind in both eyes Still see optho as he has some mild ability to see dark and light Continues on eye drops per optho  [name] Much improved Continue seroquel at current dose  Type 2 diabetes mellitus with stage 4 chronic kidney disease, without long-term current use of insulin (HCC) A1C today Discussed better diet and more exercise  Chronic anticoagulation Continues on DOAC  Chronic systolic heart failure (HCC) Discussed using lasix a little more regularly   Adam Mcmurray MD

## 2022-02-13 NOTE — Assessment & Plan Note (Signed)
Discussed using lasix a little more regularly

## 2022-02-13 NOTE — Assessment & Plan Note (Signed)
Much improved Continue seroquel at current dose

## 2022-02-13 NOTE — Assessment & Plan Note (Signed)
Still see optho as he has some mild ability to see dark and light Continues on eye drops per optho

## 2022-02-20 ENCOUNTER — Other Ambulatory Visit: Payer: Self-pay | Admitting: Family Medicine

## 2022-02-20 ENCOUNTER — Other Ambulatory Visit: Payer: Self-pay

## 2022-02-20 DIAGNOSIS — G8929 Other chronic pain: Secondary | ICD-10-CM

## 2022-02-20 DIAGNOSIS — M170 Bilateral primary osteoarthritis of knee: Secondary | ICD-10-CM

## 2022-02-21 ENCOUNTER — Other Ambulatory Visit: Payer: Self-pay | Admitting: Family Medicine

## 2022-02-21 DIAGNOSIS — M170 Bilateral primary osteoarthritis of knee: Secondary | ICD-10-CM

## 2022-02-21 DIAGNOSIS — G8929 Other chronic pain: Secondary | ICD-10-CM

## 2022-03-05 ENCOUNTER — Other Ambulatory Visit: Payer: Self-pay | Admitting: Family Medicine

## 2022-03-05 DIAGNOSIS — M79672 Pain in left foot: Secondary | ICD-10-CM

## 2022-03-05 DIAGNOSIS — M109 Gout, unspecified: Secondary | ICD-10-CM

## 2022-03-06 ENCOUNTER — Other Ambulatory Visit: Payer: Self-pay | Admitting: Internal Medicine

## 2022-03-06 MED ORDER — DAPAGLIFLOZIN PROPANEDIOL 10 MG PO TABS: 10.0000 mg | ORAL_TABLET | Freq: Every day | ORAL | 1 refills | Status: DC

## 2022-03-19 ENCOUNTER — Other Ambulatory Visit: Payer: Self-pay | Admitting: Family Medicine

## 2022-03-19 DIAGNOSIS — M170 Bilateral primary osteoarthritis of knee: Secondary | ICD-10-CM

## 2022-03-19 DIAGNOSIS — G8929 Other chronic pain: Secondary | ICD-10-CM

## 2022-04-15 ENCOUNTER — Other Ambulatory Visit: Payer: Self-pay

## 2022-04-15 DIAGNOSIS — M170 Bilateral primary osteoarthritis of knee: Secondary | ICD-10-CM

## 2022-04-15 DIAGNOSIS — G8929 Other chronic pain: Secondary | ICD-10-CM

## 2022-04-15 MED ORDER — HYDROCODONE-ACETAMINOPHEN 5-325 MG PO TABS: ORAL_TABLET | ORAL | 0 refills | Status: DC

## 2022-04-18 ENCOUNTER — Other Ambulatory Visit: Payer: Self-pay | Admitting: Internal Medicine

## 2022-04-18 NOTE — Telephone Encounter (Signed)
Prescription refill request for Eliquis received. Indication:afib Last office visit:5/23 Scr:1.6 Age: 62 Weight:137.6  kg  Prescription refilled

## 2022-04-23 ENCOUNTER — Encounter: Payer: Self-pay | Admitting: Family Medicine

## 2022-04-23 ENCOUNTER — Ambulatory Visit (INDEPENDENT_AMBULATORY_CARE_PROVIDER_SITE_OTHER): Payer: Medicare Other | Admitting: Family Medicine

## 2022-04-23 VITALS — BP 118/78 | HR 62 | Ht 67.0 in | Wt 294.2 lb

## 2022-04-23 DIAGNOSIS — I10 Essential (primary) hypertension: Secondary | ICD-10-CM

## 2022-04-23 DIAGNOSIS — I5022 Chronic systolic (congestive) heart failure: Secondary | ICD-10-CM | POA: Diagnosis not present

## 2022-04-23 DIAGNOSIS — Z95810 Presence of automatic (implantable) cardiac defibrillator: Secondary | ICD-10-CM

## 2022-04-23 DIAGNOSIS — H5316 Psychophysical visual disturbances: Secondary | ICD-10-CM

## 2022-04-23 DIAGNOSIS — N1831 Chronic kidney disease, stage 3a: Secondary | ICD-10-CM | POA: Diagnosis not present

## 2022-04-23 NOTE — Assessment & Plan Note (Signed)
Doing well on the Seroquel.  No problems with anxiety or hallucinations at night like he was having.  I had wondered if that would change when he moved in by himself but evidently he seems to be doing quite well.  Continue current medicines.  Will consider backing off dose at a future date potentially.  Some of the issues may have been stress related and now that he is back in a more stable situation for taps we can decrease the dose.

## 2022-04-23 NOTE — Progress Notes (Signed)
    CHIEF COMPLAINT / HPI: Went to see the foot doctor and they told him his blood pressure was elevated to 141.  They wanted him to come back for checkup.  He said they did only check his blood pressure right like we do it here. 2.  Taking his medicines without problems. 3.  Housing: He has moved into his new apartment.  His 1 bedroom apartment where he is living by himself with visits and some oversight from his brothers and sister.  His sister brought him to appointment today.  His son who is incarcerated may be able to do a plea deal which would mean he might be able to get home in June.  [name] is excited about that because his son has been his primary caretaker and he would like to have him back home.   PERTINENT  PMH / PSH: I have reviewed the patient's medications, allergies, past medical and surgical history, smoking status and updated in the EMR as appropriate.   OBJECTIVE:  BP 118/78   Pulse 62   Ht 5\' 7"  (1.702 m)   Wt 294 lb 3.2 oz (133.4 kg)   SpO2 96%   BMI 46.08 kg/m   GENERAL: Well-developed overweight male no acute distress HEENT: Bilaterally blind with some mild resting nystagmus CV: Regular rate and rhythm.  No murmur. LUNGS: Clear to auscultation bilaterally. ABDOMEN: Obese, soft.  No ascites. Extremity: Trace edema bilateral feet and ankles. VASCULAR: Good pedal pulses 2+ bilaterally are symmetrical. PSYCH: AxOx4. Good eye contact.. No psychomotor retardation or agitation. Appropriate speech fluency and content. Asks and answers questions appropriately. Mood is congruent.  ASSESSMENT / PLAN:   RENAL DISEASE, CHRONIC, STAGE III Will recheck creatinine today.  He does follow with nephrology but typically sees them once a year.  He is not sure when his next checkup is.  Single implantable cardioverter-defibrillator (ICD) in situ Follows regularly with electrophysiology and has had recent appointment.  Chronic systolic heart failure ([place]) He appears  euvolemic.  Continue current medications.  Check labs today.  [name] Needs syndrome Doing well on the Seroquel.  No problems with anxiety or hallucinations at night like he was having.  I had wondered if that would change when he moved in by himself but evidently he seems to be doing quite well.  Continue current medicines.  Will consider backing off dose at a future date potentially.  Some of the issues may have been stress related and now that he is back in a more stable situation for taps we can decrease the dose.  HYPERTENSION, BENIGN Excellent control today.  Unclear why he had a reportedly elevated blood pressure reading at an outside clinic.  No change in medications.   Adam Mcmurray MD

## 2022-04-23 NOTE — Patient Instructions (Signed)
Your blood pressure looks well-controlled today.  No changes in your medication.  I will recheck your kidney function and your electrolytes today and I will send you note about that.  I would plan on seeing you back in 2 to 3 months and at that time we will check your A1c.  Please call in the interim if you have any new or worsening conditions.  Great to see you!

## 2022-04-23 NOTE — Assessment & Plan Note (Signed)
He appears euvolemic.  Continue current medications.  Check labs today.

## 2022-04-23 NOTE — Assessment & Plan Note (Signed)
Excellent control today.  Unclear why he had a reportedly elevated blood pressure reading at an outside clinic.  No change in medications.

## 2022-04-23 NOTE — Assessment & Plan Note (Signed)
Follows regularly with electrophysiology and has had recent appointment.

## 2022-04-23 NOTE — Assessment & Plan Note (Signed)
Will recheck creatinine today.  He does follow with nephrology but typically sees them once a year.  He is not sure when his next checkup is.

## 2022-04-24 ENCOUNTER — Encounter: Payer: Self-pay | Admitting: Family Medicine

## 2022-04-24 LAB — BASIC METABOLIC PANEL
BUN/Creatinine Ratio: 8 — ABNORMAL LOW (ref 10–24)
BUN: 12 mg/dL (ref 8–27)
CO2: 25 mmol/L (ref 20–29)
Calcium: 9 mg/dL (ref 8.6–10.2)
Chloride: 108 mmol/L — ABNORMAL HIGH (ref 96–106)
Creatinine, Ser: 1.44 mg/dL — ABNORMAL HIGH (ref 0.76–1.27)
Glucose: 111 mg/dL — ABNORMAL HIGH (ref 70–99)
Potassium: 4.7 mmol/L (ref 3.5–5.2)
Sodium: 146 mmol/L — ABNORMAL HIGH (ref 134–144)
eGFR: 55 mL/min/{1.73_m2} — ABNORMAL LOW (ref >59)

## 2022-05-14 ENCOUNTER — Other Ambulatory Visit: Payer: Self-pay | Admitting: Family Medicine

## 2022-05-14 DIAGNOSIS — S39012A Strain of muscle, fascia and tendon of lower back, initial encounter: Secondary | ICD-10-CM

## 2022-05-15 ENCOUNTER — Other Ambulatory Visit: Payer: Self-pay

## 2022-05-15 DIAGNOSIS — M170 Bilateral primary osteoarthritis of knee: Secondary | ICD-10-CM

## 2022-05-15 DIAGNOSIS — G8929 Other chronic pain: Secondary | ICD-10-CM

## 2022-05-16 MED ORDER — HYDROCODONE-ACETAMINOPHEN 5-325 MG PO TABS: ORAL_TABLET | ORAL | 0 refills | Status: DC

## 2022-05-26 ENCOUNTER — Other Ambulatory Visit: Payer: Self-pay | Admitting: Family Medicine

## 2022-06-06 ENCOUNTER — Other Ambulatory Visit: Payer: Self-pay | Admitting: Internal Medicine

## 2022-06-11 ENCOUNTER — Other Ambulatory Visit: Payer: Self-pay

## 2022-06-11 DIAGNOSIS — M170 Bilateral primary osteoarthritis of knee: Secondary | ICD-10-CM

## 2022-06-11 DIAGNOSIS — G8929 Other chronic pain: Secondary | ICD-10-CM

## 2022-06-11 MED ORDER — HYDROCODONE-ACETAMINOPHEN 5-325 MG PO TABS: ORAL_TABLET | ORAL | 0 refills | Status: DC

## 2022-06-13 ENCOUNTER — Other Ambulatory Visit: Payer: Self-pay | Admitting: Family Medicine

## 2022-06-30 ENCOUNTER — Telehealth: Payer: Self-pay

## 2022-06-30 NOTE — Telephone Encounter (Signed)
Rec'd fax from patients pharmacy regarding coverage of Lantus solostar.  Insurance prefers: [place], [place], or [place].   Could medication be changed to a prefer med, or would you like me to move forward with PA for Lantus Solostar?

## 2022-07-01 MED ORDER — INSULIN GLARGINE-YFGN 100 UNIT/ML ~~LOC~~ SOPN: 25.0000 [IU] | PEN_INJECTOR | Freq: Every day | SUBCUTANEOUS | 11 refills | Status: DC

## 2022-07-01 NOTE — Addendum Note (Signed)
Addended byDickie La on: 07/01/2022 07:49 PM   Modules accepted: Orders

## 2022-07-01 NOTE — Telephone Encounter (Signed)
Dear Nyoka Cowden Team Can you let him (or his sister who answers his phone) that I sent in his insulin. We had to change brands but the dose is the same. It is semglee. He used to be on lantus. THANKS! [name]

## 2022-07-02 NOTE — Telephone Encounter (Signed)
Attempted to reach patient and there was no answer.  Will try later.  Ozella Almond, [place]

## 2022-07-03 NOTE — Telephone Encounter (Signed)
Attempted to call patient again with no answer.  There was no ability to leave a message.  Ozella Almond, CMA

## 2022-07-03 NOTE — Telephone Encounter (Signed)
Spoke to [employer]. They have spoken to the pt sister. Insulin will be delivered tomorrow. Ottis Stain, CMA

## 2022-07-09 ENCOUNTER — Other Ambulatory Visit: Payer: Self-pay

## 2022-07-09 DIAGNOSIS — G8929 Other chronic pain: Secondary | ICD-10-CM

## 2022-07-09 DIAGNOSIS — M170 Bilateral primary osteoarthritis of knee: Secondary | ICD-10-CM

## 2022-07-09 MED ORDER — HYDROCODONE-ACETAMINOPHEN 5-325 MG PO TABS: ORAL_TABLET | ORAL | 0 refills | Status: DC

## 2022-07-16 ENCOUNTER — Encounter: Payer: Self-pay | Admitting: Family Medicine

## 2022-07-16 ENCOUNTER — Ambulatory Visit (INDEPENDENT_AMBULATORY_CARE_PROVIDER_SITE_OTHER): Payer: Medicare Other | Admitting: Family Medicine

## 2022-07-16 VITALS — BP 114/72 | HR 66 | Ht 67.0 in | Wt 295.4 lb

## 2022-07-16 DIAGNOSIS — N184 Chronic kidney disease, stage 4 (severe): Secondary | ICD-10-CM | POA: Diagnosis not present

## 2022-07-16 DIAGNOSIS — N1831 Chronic kidney disease, stage 3a: Secondary | ICD-10-CM | POA: Diagnosis not present

## 2022-07-16 DIAGNOSIS — E1122 Type 2 diabetes mellitus with diabetic chronic kidney disease: Secondary | ICD-10-CM | POA: Diagnosis not present

## 2022-07-16 DIAGNOSIS — H5316 Psychophysical visual disturbances: Secondary | ICD-10-CM

## 2022-07-16 DIAGNOSIS — I5022 Chronic systolic (congestive) heart failure: Secondary | ICD-10-CM

## 2022-07-16 DIAGNOSIS — I480 Paroxysmal atrial fibrillation: Secondary | ICD-10-CM | POA: Diagnosis not present

## 2022-07-16 LAB — POCT GLYCOSYLATED HEMOGLOBIN (HGB A1C): HbA1c, POC (controlled diabetic range): 6.1 % (ref 0.0–7.0)

## 2022-07-16 NOTE — Assessment & Plan Note (Deleted)
Recently saw the nephrologist and will follow with them yearly.

## 2022-07-16 NOTE — Assessment & Plan Note (Signed)
euvolemic 

## 2022-07-16 NOTE — Assessment & Plan Note (Signed)
Doing extremely well on current medication regimen we will continue.

## 2022-07-16 NOTE — Progress Notes (Signed)
    CHIEF COMPLAINT / HPI:   #1: Follow-up diabetes mellitus: Doing pretty well.  Taking his medicine regularly.  No episodes of low blood sugar.  Not exercising secondary to his blindness and the fact that he does not really have any safe place to exercise right now. 2.  Congestive heart failure: Doing well.  No episodes of lower extremity swelling.  No shortness of breath that is unusual. 3.  Follow-up visual hallucinations and blindness: Doing pretty well with the Seroquel.  Says he sleeps most nights without awakening unnecessarily.  Once or twice a month he will wake up in feel he sees people but not nearly as bad as it once was.  Overall he is pretty pleased. 4.  Social issues: His son has been granted a plea bargain and will be home in about 30 days.  This is great news for him.  PERTINENT  PMH / PSH: I have reviewed the patient's medications, allergies, past medical and surgical history, smoking status and updated in the EMR as appropriate.   OBJECTIVE:  BP 114/72   Pulse 66   Ht 5\' 7"  (1.702 m)   Wt 295 lb 6.4 oz (134 kg)   SpO2 97%   BMI 46.27 kg/m  GENERAL: Well-developed overweight male no acute distress CV: Regular rate and rhythm.  Heart sounds are distant.  No murmur. LUNGS: Clear to auscultation although lung sounds are also distant. ABDOMEN: Obese, nontender nondistended EXTREMITY: No edema is noted.  He is ambulating on his own with use of a cane.  ASSESSMENT / PLAN:   Type 2 diabetes mellitus with stage 4 chronic kidney disease, without long-term current use of insulin (HCC) Congratulated on his excellent control today.  Will continue current medication regimen and recheck A1c in 3 to 4 months.  Paroxysmal atrial fibrillation (HCC) He seems to be doing well with his A-fib and his heart disease.  He is euvolemic today.  Seems to be in regular rhythm today  [name] syndrome Doing extremely well on current medication regimen we will continue.  Chronic  systolic heart failure (HCC) euvolemic   Denny Levy MD

## 2022-07-16 NOTE — Assessment & Plan Note (Signed)
He seems to be doing well with his A-fib and his heart disease.  He is euvolemic today.  Seems to be in regular rhythm today

## 2022-07-16 NOTE — Assessment & Plan Note (Signed)
Congratulated on his excellent control today.  Will continue current medication regimen and recheck A1c in 3 to 4 months.

## 2022-07-17 ENCOUNTER — Other Ambulatory Visit (HOSPITAL_COMMUNITY): Payer: Self-pay

## 2022-08-05 ENCOUNTER — Other Ambulatory Visit: Payer: Self-pay | Admitting: Family Medicine

## 2022-08-05 DIAGNOSIS — G8929 Other chronic pain: Secondary | ICD-10-CM

## 2022-08-05 DIAGNOSIS — M170 Bilateral primary osteoarthritis of knee: Secondary | ICD-10-CM

## 2022-08-06 ENCOUNTER — Telehealth: Payer: Self-pay | Admitting: Internal Medicine

## 2022-08-06 ENCOUNTER — Telehealth: Payer: Self-pay

## 2022-08-06 NOTE — Telephone Encounter (Signed)
[name] calls nurse line in regards to patients medications.   She reports their son just got to his father house yesterday, however has concerns his father has not been taking his medication correctly. She reports he can not see and their son "found bags of medicine" around the house. He is unsure what is his father should be taking.  They have reached out to all of his doctors for a detailed list for their son. She asks we do the same.  Will forward to PCP for a basic list of medications to relay to son.

## 2022-08-06 NOTE — Telephone Encounter (Signed)
Returned call no answer the phone just rang. Unable to leave voicemail

## 2022-08-06 NOTE — Telephone Encounter (Signed)
Patient's wife [name] called stating she would like to go over her husband's medications with nurse.  She would like to know what her husband should be taking, and how many times a day he should be taking the medication that Dr. Graciela Husbands has prescribed to him.

## 2022-08-06 NOTE — Telephone Encounter (Signed)
Please see other encounter.

## 2022-08-06 NOTE — Telephone Encounter (Signed)
Pt' spouse would like a call back regarding pt's current medication list. Please advise

## 2022-08-06 NOTE — Telephone Encounter (Signed)
Spoke with wife and se wanted to know what medications on patient medication list were prescribed by Dr. Graciela Husbands.  Currently on med list Eliquis 5mg  bid and Farxiga 10mg  daily are prescribed by Dr. Graciela Husbands.  Wife verbalized understanding.

## 2022-08-07 NOTE — Telephone Encounter (Signed)
Patients wife calls nurse line again in regards to medication list.

## 2022-08-07 NOTE — Addendum Note (Signed)
Addended byDenny Levy L on: 08/07/2022 11:27 AM   Modules accepted: Orders

## 2022-08-07 NOTE — Telephone Encounter (Signed)
I have reviewed an updated his current list in EPIC so you can send him a copy or he can pick it up. He should dicard anything not on the list. Alternatively, you could set him and his son up to see Dr. Raymondo Band in pharmacy cliinic---they could bring all of those old meds in and we can sort through them and dispose of what is not needed.  That would be my preference THANKS! Denny Levy

## 2022-08-07 NOTE — Telephone Encounter (Addendum)
Spoke to wife. I will leave a copy of the med list up front for her to pick up. Wife will also call and schedule an appt for pt to see Dr. Raymondo Band with his son.  Sunday Spillers, CMA

## 2022-08-24 NOTE — Progress Notes (Unsigned)
Cardiology Office Note Date:  08/25/2022  Patient ID:  [name] 1960/10/14, MRN [id number] PCP:  Nestor Ramp, MD  Cardiologist:  Dr. Sharyn Lull Electrophysiologist: Dr. Graciela Husbands    Chief Complaint:     annual visit  History of Present Illness: [name] is a 62 y.o. male with history of HTN, HLD, DM, TIA, NICM, chronic CHF (systolic), ICD, blindness 2/2 glaucoma, CKD (III), AFib  I saw him Oct 2020 He feels well.  Denies any CP, palpitations or cardiac awareness.  No SOB, does not exercise, but denies difficulties with ADLs.  No dizzy spells, no near syncope or syncope, no shocks He saw Dr. Sharyn Lull recently, states he does his labs as well as his PMD. He tells me he can see shadows, no detail Device function was intact, no changes were made.  Hospitalization June 2021, progressive SOB, admitted for acute hypoxic respiratory failure initially thought secondary to acute on chronic systolic heart failure complicated by paroxysmal atrial fibrillation, more likely bronchitis after further evaluation.  Also noted to have AKI. Discharged  09/17/19   I saw him 05/28/20 He c/w Dr. Sharyn Lull, had an echo done a couple weeks ago, reported to him that his heart remains "stiff" had a leaky valve of no concern. L. Short RN recently had him take his lasix daily for 4 days and resume PRN afterwards for elevated OptiVol. The patient denies SOB but does have a intermittent productive cough, no fever symptoms of illness. Denies noctural or positional SOB, no DOE> No CP, palpitations or cardiac awareness, no dizzy spells, near syncope or syncope. He is blind, so uncertain on stool/urine, denies bleeding gums with brushing/nose bleeds etc. OptiVol still abnormal and lasix pulsed again and planned f/u with Dr. Sharyn Lull, EP in a year  He saw Dr. Graciela Husbands 05/20/21 Doing well, continued meds, euvolemic, lasix being used PRN, no changes were made.   I saw him May 2023 He feels well, says his  heart has not been troubling him. No CP, palpitations or SOB No cardiac awareness He continues to follow closely with Dr. Sharyn Lull, Q 3 mo, see his PMD next month and Dr. Sharyn Lull in July No near syncope or syncope. No bleeding His sister helps him Felt to have some volume on board, advised to take his lasix for a couple days and follow up with Dr. Sharyn Lull  TODAY He c/w Dr. Sharyn Lull, sees him a couple times a year, last was about 2 mo ago, no changes were made. He does not think he is retaining fluid, when he is swollen/bloated he doubles up on his lasix a couple days, he and Dr. Sharyn Lull have discussed this. His son lives with him and his Ex-wife sister help, they have a good system with his meds He denies concerns about possibility of medication errors (given his blindness) He denies any CP, no SOB Denies DOE, no nocturnal; SOB No near syncope or syncope. No bleeding or signs of bleeding   Device History: MDT single chamber ICD implanted 2008 for NICM; gen change 2017 History of appropriate therapy: No History of AAD therapy: No  Past Medical History:  Diagnosis Date   Arthritis    Arthritis of knee, degenerative 10/03/2011   Right > LEFT KNEE oa X-rays were not standing but showed some mild sclerosis and joint space loss of the medial compartment of the right knee. History of arthroscopy right knee. Report of injury as a child but unclear exactly what that was.  :I am  aware of 12/01/18 Rx by Dr. Jennette Kettle.  While wife was hospitalized, narcotic Rx went missing when in-home sitter left.  To my knowledge, this is the first mi   Automatic implantable cardioverter-defibrillator in situ 07/15/2013   Blind in both eyes 06/15/2013   Right Sees Dr Dione Booze   Cataracts, bilateral    right   Chronic gout due to renal impairment of multiple sites without tophus 04/12/2020   Chronic systolic heart failure (HCC) 02/18/2007   Qualifier: Diagnosis of  By: Bryson Ha MD, Gwendalyn Ege    Congestive heart failure  (HCC)    a. s/p MDT single chamber ICD    Diabetes mellitus    does not check blood sugars at home   Diverticulosis    Encounter for chronic pain management 05/17/2014   Indication for chronic opioid: severe arthritis RIGHTknee / low back Medication and dose: hydrocodone---uses prn # pills per month: gets #120 pills to last TWO months Last UDS date: 05/17/2014 Pain contract signed (Y/N): Y 05/17/2014 Date narcotic database last reviewed (include red flags): 05/17/2014    Glaucoma    bilateral   Glaucoma 11/07/2015   Severe See Dr. Dione Booze   H/O TIA (transient ischemic attack) and stroke    Hyperlipidemia    Hyperlipidemia with target low density lipoprotein (LDL) cholesterol less than 100 mg/dL 1/61/0960   Hypertension    HYPERTENSION, BENIGN 02/18/2007   Qualifier: Diagnosis of  By: Jennette Kettle MD, Ronald Lobo of both feet 04/12/2020   NICM (nonischemic cardiomyopathy) (HCC) 05/30/2015   Obesity    OBESITY, MORBID 05/21/2010   Qualifier: Diagnosis of  By: Graciela Husbands, MD, Susie Cassette    Obstructive sleep apnea 03/27/2009   Qualifier: Diagnosis of  By: Sherral Hammers, RN, BSN, Melanie     Paroxysmal atrial fibrillation (HCC) 09/21/2019   Paroxysmal VT (HCC) 06/22/2019   RENAL DISEASE, CHRONIC, STAGE III 02/18/2007   SHOULDER PAIN, LEFT 08/01/2009   History of left shoulder surgery.     Single implantable cardioverter-defibrillator (ICD) in situ 06/17/2011   Type 2 diabetes mellitus with stage 4 chronic kidney disease, without long-term current use of insulin (HCC) 08/25/2007    Past Surgical History:  Procedure Laterality Date   CARDIAC CATHETERIZATION  10/24/2003   EF of 45-50%   CARDIAC DEFIBRILLATOR PLACEMENT  2008   CATARACT EXTRACTION  2012   right   EP IMPLANTABLE DEVICE N/A 05/30/2015   Procedure: ICD Generator Changeout;  Surgeon: Duke Salvia, MD;  Location: [facility] INVASIVE CV LAB;  Service: Cardiovascular;  Laterality: N/A;   KNEE SURGERY Right 2000   SHOULDER SURGERY  2012   left     Current Outpatient Medications  Medication Sig Dispense Refill   allopurinol (ZYLOPRIM) 100 MG tablet TAKE 1/2 TABLET BY MOUTH DAILY 45 tablet 3   brimonidine (ALPHAGAN) 0.2 % ophthalmic solution Place 1 drop into both eyes 2 (two) times daily.     carvedilol (COREG) 25 MG tablet Take 1 tablet (25 mg total) by mouth 2 (two) times daily with a meal.     cetirizine (ZYRTEC ALLERGY) 10 MG tablet Take 1 tablet (10 mg total) by mouth daily. (Patient taking differently: Take 10 mg by mouth daily as needed for allergies.) 30 tablet 6   cyclobenzaprine (FLEXERIL) 5 MG tablet TAKE ONE TABLET BY MOUTH AT BEDTIME AS NEEDED muscle SPASMS 50 tablet 1   dapagliflozin propanediol (FARXIGA) 10 MG TABS tablet Take 1 tablet (10 mg total) by mouth daily. Pt needs to  keep upcoming appt in May for further refills 30 tablet 1   diclofenac Sodium (VOLTAREN) 1 % GEL Apply 2 g topically 4 (four) times daily as needed (right knee pain). 150 g 2   dorzolamide-timolol (COSOPT) 22.3-6.8 MG/ML ophthalmic solution Place 1 drop into both eyes 2 (two) times daily.     ELIQUIS 5 MG TABS tablet TAKE ONE TABLET BY MOUTH TWICE DAILY 180 tablet 1   EPINEPHRINE 0.3 mg/0.3 mL IJ SOAJ injection Inject 0.3 mg into the muscle as needed for anaphylaxis. 2 each 1   FLOVENT HFA 110 MCG/ACT inhaler Inhale 2 puffs into the lungs 2 (two) times daily. 12 g 0   furosemide (LASIX) 40 MG tablet Take 1 tablet (40 mg total) by mouth as directed. Strength: 40 mg 90 tablet 3   glucose blood (ONETOUCH VERIO) test strip Use as instructed. Use up to four times daily as directed. (FOR ICD-10 E10.9, E11.9). 100 each 12   HYDROcodone-acetaminophen (NORCO/VICODIN) 5-325 MG tablet TAKE ONE TABLET BY MOUTH UP TO FOUR TIMES DAILY AND MAY take ONE tablet intermittently FOR a max of FIVE tablets PER DAY FOR FOR PAIN 150 tablet 0   insulin glargine-yfgn (SEMGLEE) 100 UNIT/ML Pen Inject 25 Units into the skin daily. 9 mL 11   ipratropium (ATROVENT) 0.03 % nasal  spray Place 2 sprays into both nostrils every 12 (twelve) hours. 30 mL 12   isosorbide mononitrate (ISMO) 20 MG tablet TAKE ONE TABLET BY MOUTH TWICE DAILY 180 tablet 3   latanoprost (XALATAN) 0.005 % ophthalmic solution Place 1 drop into both eyes every evening.     NARCAN 4 MG/0.1ML LIQD nasal spray kit Place 0.4 mg into the nose as needed (accidental overdose).     nitroGLYCERIN (NITROSTAT) 0.4 MG SL tablet Place 0.4 mg under the tongue every 5 (five) minutes as needed for chest pain.     QUEtiapine (SEROQUEL) 50 MG tablet TAKE THREE TABLETS BY MOUTH AT BEDTIME 90 tablet 3   rosuvastatin (CRESTOR) 10 MG tablet TAKE ONE TABLET BY MOUTH EVERY DAY 90 tablet 3   sacubitril-valsartan (ENTRESTO) 49-51 MG Take 1 tablet by mouth 2 (two) times daily.     SURE COMFORT PEN NEEDLES 32G X 6 MM MISC USE ONE UNITS EVERY MORNING, AT NOON evening, AND AT BEDTIME 100 each 1   TRUEPLUS INSULIN SYRINGE 31G X 5/16" 0.3 ML MISC      No current facility-administered medications for this visit.    Allergies:   Bee pollen, Shellfish allergy, and Nsaids   Social History:  The patient  reports that he has never smoked. He has never used smokeless tobacco. He reports that he does not drink alcohol and does not use drugs.   Family History:  The patient's family history includes Colon cancer in his father.  ROS:  Please see the history of present illness.  All other systems are reviewed and otherwise negative.   PHYSICAL EXAM:  VS:  BP (!) 138/95   Pulse 76   Ht 5\' 2"  (1.575 m)   Wt (!) 303 lb 9.6 oz (137.7 kg)   SpO2 97%   BMI 55.53 kg/m  BMI: Body mass index is 55.53 kg/m. Well nourished, well developed, in no acute distress  HEENT: normocephalic, atraumatic  Neck: no JVD, carotid bruits or masses Cardiac:  RRR; 1-2/6 SM, no rubs, or gallops Lungs:   CTA b/l, no wheezing, rhonchi or rales  Abd: soft, nontender, obese MS: no deformity or atrophy Ext:  trace-1+  edema to mid shin edema  Skin: warm and  dry, no rash Neuro:  No gross deficits appreciated Psych: euthymic mood, full affect  ICD site is stable, no tethering or discomfort   EKG:  Not done today  ICD interrogation done today and reviewed by myself:  Battery and leda measurements are good One NSVT, 5 beats OptiVol looks good   05/01/15: TTE Study Conclusions - Left ventricle: The cavity size was normal. There was moderate   focal basal hypertrophy of the septum. Systolic function was   mildly to moderately reduced. The estimated ejection fraction was   in the range of 40% to 45%. Diffuse hypokinesis. Doppler   parameters are consistent with abnormal left ventricular   relaxation (grade 1 diastolic dysfunction). - Aortic valve: Transvalvular velocity was within the normal range.   There was no stenosis. There was no regurgitation. - Mitral valve: Transvalvular velocity was within the normal range.   There was no evidence for stenosis. There was no regurgitation. - Right ventricle: The cavity size was moderately dilated. Wall   thickness was normal. Systolic function was normal. - Tricuspid valve: There was no regurgitation.    Recent Labs: 09/13/2021: Hemoglobin 16.2; Platelets 213 12/11/2021: ALT 9 04/23/2022: BUN 12; Creatinine, Ser 1.44; Potassium 4.7; Sodium 146  No results found for requested labs within last 365 days.   CrCl cannot be calculated (Patient's most recent lab result is older than the maximum 21 days allowed.).   Wt Readings from Last 3 Encounters:  08/25/22 (!) 303 lb 9.6 oz (137.7 kg)  07/16/22 295 lb 6.4 oz (134 kg)  04/23/22 294 lb 3.2 oz (133.4 kg)     Other studies reviewed: Additional studies/records reviewed today include: summarized above  ASSESSMENT AND PLAN:  1. ICD     Intact function     no programming changes made  2. NICM 3. Chronic CHF     OptiVol, looks good, no symptoms or clear evidence of volume OL     On BB, entresto, nitrate, aldactone, lasix (and xtra as needed as  needed)     C/w Dr. Sharyn Lull  4. HTN     Recheck is 136/80, unusual for him     Will f/u with his PMD or Dr. Sharyn Lull  5. HLD     Monitored by Dr. Sharyn Lull      not addressed today  6. Paroxysmal Afib     CHA2DS2Vasc is 5, on eliquis, appropriately dosed for his age/weight    No burden by symptoms   7. Secondary hypercoagulable state    Disposition: remotes as usual, in clinic with EP again in a year, sooner if needed     Current medicines are reviewed at length with the patient today.  The patient did not have any concerns regarding medicines.  Norma Fredrickson, PA-C 08/25/2022 9:39 AM     [facility] [address] [place] [address] [phone] (office)  [phone] (fax)

## 2022-08-25 ENCOUNTER — Ambulatory Visit: Payer: Medicare Other | Attending: Physician Assistant | Admitting: Physician Assistant

## 2022-08-25 ENCOUNTER — Other Ambulatory Visit: Payer: Self-pay | Admitting: Family Medicine

## 2022-08-25 ENCOUNTER — Encounter: Payer: Self-pay | Admitting: Physician Assistant

## 2022-08-25 VITALS — BP 138/95 | HR 76 | Ht 62.0 in | Wt 303.6 lb

## 2022-08-25 DIAGNOSIS — I428 Other cardiomyopathies: Secondary | ICD-10-CM | POA: Diagnosis not present

## 2022-08-25 DIAGNOSIS — Z95810 Presence of automatic (implantable) cardiac defibrillator: Secondary | ICD-10-CM | POA: Diagnosis not present

## 2022-08-25 DIAGNOSIS — I480 Paroxysmal atrial fibrillation: Secondary | ICD-10-CM | POA: Diagnosis present

## 2022-08-25 DIAGNOSIS — I10 Essential (primary) hypertension: Secondary | ICD-10-CM

## 2022-08-25 DIAGNOSIS — S39012A Strain of muscle, fascia and tendon of lower back, initial encounter: Secondary | ICD-10-CM

## 2022-08-25 DIAGNOSIS — D6869 Other thrombophilia: Secondary | ICD-10-CM | POA: Diagnosis not present

## 2022-08-25 DIAGNOSIS — I5022 Chronic systolic (congestive) heart failure: Secondary | ICD-10-CM | POA: Diagnosis not present

## 2022-08-25 LAB — CUP PACEART INCLINIC DEVICE CHECK
Battery Remaining Longevity: 52 mo
Battery Voltage: 2.99 V
Brady Statistic RV Percent Paced: 0.02 %
Date Time Interrogation Session: 20240520102545
HighPow Impedance: 47 Ohm
HighPow Impedance: 61 Ohm
Implantable Lead Connection Status: 753985
Implantable Lead Implant Date: 20081114
Implantable Lead Location: 753860
Implantable Lead Model: 6947
Implantable Pulse Generator Implant Date: 20170222
Lead Channel Impedance Value: 342 Ohm
Lead Channel Impedance Value: 399 Ohm
Lead Channel Pacing Threshold Amplitude: 0.625 V
Lead Channel Pacing Threshold Pulse Width: 0.4 ms
Lead Channel Sensing Intrinsic Amplitude: 5.875 mV
Lead Channel Sensing Intrinsic Amplitude: 8.125 mV
Lead Channel Setting Pacing Amplitude: 2.5 V
Lead Channel Setting Pacing Pulse Width: 0.4 ms
Lead Channel Setting Sensing Sensitivity: 0.3 mV
Zone Setting Status: 755011

## 2022-08-25 NOTE — Patient Instructions (Signed)
Medication Instructions:    Your physician recommends that you continue on your current medications as directed. Please refer to the Current Medication list given to you today.  *If you need a refill on your cardiac medications before your next appointment, please call your pharmacy*   Lab Work:  NONE ORDERED  TODAY   If you have labs (blood work) drawn today and your tests are completely normal, you will receive your results only by: MyChart Message (if you have MyChart) OR A paper copy in the mail If you have any lab test that is abnormal or we need to change your treatment, we will call you to review the results.   Testing/Procedures: NONE ORDERED  TODAY     Follow-Up: At [facility], you and your health needs are our priority.  As part of our continuing mission to provide you with exceptional heart care, we have created designated Provider Care Teams.  These Care Teams include your primary Cardiologist (physician) and Advanced Practice Providers (APPs -  Physician Assistants and Nurse Practitioners) who all work together to provide you with the care you need, when you need it.  We recommend signing up for the patient portal called "MyChart".  Sign up information is provided on this After Visit Summary.  MyChart is used to connect with patients for Virtual Visits (Telemedicine).  Patients are able to view lab/test results, encounter notes, upcoming appointments, etc.  Non-urgent messages can be sent to your provider as well.   To learn more about what you can do with MyChart, go to [email or web address].    Your next appointment:   1 year(s)  Provider:   You may see Steven Klein, MD or one of the following Advanced Practice Providers on your designated Care Team:   Renee Ursuy, PA-C  Other Instructions  

## 2022-08-29 ENCOUNTER — Ambulatory Visit (INDEPENDENT_AMBULATORY_CARE_PROVIDER_SITE_OTHER): Payer: Medicare Other

## 2022-08-29 DIAGNOSIS — I428 Other cardiomyopathies: Secondary | ICD-10-CM | POA: Diagnosis not present

## 2022-08-29 LAB — CUP PACEART REMOTE DEVICE CHECK
Battery Remaining Longevity: 52 mo
Battery Voltage: 2.99 V
Brady Statistic RV Percent Paced: 0.01 %
Date Time Interrogation Session: 20240524065022
HighPow Impedance: 42 Ohm
HighPow Impedance: 54 Ohm
Implantable Lead Connection Status: 753985
Implantable Lead Implant Date: 20081114
Implantable Lead Location: 753860
Implantable Lead Model: 6947
Implantable Pulse Generator Implant Date: 20170222
Lead Channel Impedance Value: 342 Ohm
Lead Channel Impedance Value: 399 Ohm
Lead Channel Pacing Threshold Amplitude: 0.625 V
Lead Channel Pacing Threshold Pulse Width: 0.4 ms
Lead Channel Sensing Intrinsic Amplitude: 6 mV
Lead Channel Sensing Intrinsic Amplitude: 6 mV
Lead Channel Setting Pacing Amplitude: 2.5 V
Lead Channel Setting Pacing Pulse Width: 0.4 ms
Lead Channel Setting Sensing Sensitivity: 0.3 mV
Zone Setting Status: 755011

## 2022-09-03 ENCOUNTER — Other Ambulatory Visit: Payer: Self-pay | Admitting: Family Medicine

## 2022-09-03 DIAGNOSIS — G8929 Other chronic pain: Secondary | ICD-10-CM

## 2022-09-03 DIAGNOSIS — R059 Cough, unspecified: Secondary | ICD-10-CM

## 2022-09-03 DIAGNOSIS — M170 Bilateral primary osteoarthritis of knee: Secondary | ICD-10-CM

## 2022-09-03 MED ORDER — NARCAN 4 MG/0.1ML NA LIQD: 0.4000 mg | NASAL | 1 refills | Status: DC | PRN

## 2022-09-03 MED ORDER — CETIRIZINE HCL 10 MG PO TABS
10.0000 mg | ORAL_TABLET | Freq: Every day | ORAL | 6 refills | Status: DC
Start: 2022-09-03 — End: 2024-02-17

## 2022-09-09 ENCOUNTER — Telehealth: Payer: Self-pay

## 2022-09-09 NOTE — Telephone Encounter (Signed)
Please refer to other phone note today.  This has been handled. Thanks.

## 2022-09-09 NOTE — Telephone Encounter (Signed)
Reviewed patients last remote transmission with wife.   As of 5/24 transmission: No episodes/therapies Normal Device function Optivol WNL.  Wife questioning whether patient should still be taking bid diuretics. I have referred wife to call general cardiologist to discuss this question.    Wife thanks me for the follow up.

## 2022-09-09 NOTE — Telephone Encounter (Signed)
The patient wife calling to get the results of his remote transmission. I let her speak with Mandy, rn.

## 2022-09-09 NOTE — Telephone Encounter (Signed)
Received voice mail message from patients wife, [name].  She stated patient has received a new monitor and a new remote transmission was sent.  She would like a call back at [phone] to confirm the transmission was received.     Sent to device clinic triage for follow up since patient is no longer participating in [facility] clinic.

## 2022-09-16 NOTE — Progress Notes (Signed)
Remote ICD transmission.   

## 2022-09-29 ENCOUNTER — Telehealth: Payer: Self-pay

## 2022-09-29 ENCOUNTER — Other Ambulatory Visit: Payer: Self-pay

## 2022-09-29 DIAGNOSIS — M170 Bilateral primary osteoarthritis of knee: Secondary | ICD-10-CM

## 2022-09-29 DIAGNOSIS — G8929 Other chronic pain: Secondary | ICD-10-CM

## 2022-09-29 NOTE — Telephone Encounter (Signed)
Thx

## 2022-09-29 NOTE — Telephone Encounter (Signed)
Returned call to wife as requested by voice mail message regarding remote transmission sent today. She would like to resume monthly ICM follow up for patient.  She wanted to confirm that the remote transmission was received today and if showing any fluid.    She reports patient has swollen feet and hanging over his shoes.  She stated Dr Sharyn Lull changed Furosemide to 20 mg 2 tablets (40 mg total) as needed.  She stated he took Furosemide 40 mg on 6/20 x 1 day with some relief of swelling in the feet but returned after a day.    09/29/2022 Optivol Impedance thoracic impedance suggesting possible fluid accumulation starting 6/1 and back to normal 6/21 but suggesting fluid possibly returned on 6/22.    Recommended to take Furosemide 20 mg x 5 days.  Advised to drink 64 oz fluid daily.  Recommended to call back if condition changes or has any dizziness or lightheadedness.  She agreed with plan and verbalized understanding.   Will recheck fluid levels on 7/1.    Copy sent to Dr Graciela Husbands as Lorain Childes.

## 2022-09-30 ENCOUNTER — Other Ambulatory Visit: Payer: Self-pay | Admitting: Family Medicine

## 2022-09-30 DIAGNOSIS — G8929 Other chronic pain: Secondary | ICD-10-CM

## 2022-09-30 DIAGNOSIS — M170 Bilateral primary osteoarthritis of knee: Secondary | ICD-10-CM

## 2022-10-06 ENCOUNTER — Telehealth: Payer: Self-pay

## 2022-10-06 ENCOUNTER — Ambulatory Visit: Payer: Medicare Other | Attending: Internal Medicine

## 2022-10-06 DIAGNOSIS — Z95810 Presence of automatic (implantable) cardiac defibrillator: Secondary | ICD-10-CM

## 2022-10-06 DIAGNOSIS — I5022 Chronic systolic (congestive) heart failure: Secondary | ICD-10-CM

## 2022-10-06 NOTE — Progress Notes (Signed)
EPIC Encounter for ICM Monitoring  Patient Name: [name] is a 62 y.o. male Date: 10/06/2022 Primary Care Physican: Nestor Ramp, MD Primary Cardiologist: Sharyn Lull  Electrophysiologist: Graciela Husbands 08/25/2022 Office Weight: 303 lbs        Attempted call to wife per DPR and unable to reach.  Left detailed message per DPR regarding transmission. Transmission reviewed.    Optivol thoracic impedance suggesting fluid levels returned to normal after taking PRN 20 mg Furosemide x 5 days.   Impedance was suggesting possible fluid accumulation for the month of June.   Prescribed:  Furosemide 40 mg take 1 tablet(s) (40 mg total) by mouth daily as directed.  Per wife, Dr Sharyn Lull instructed him to take Furosemide 20 mg take 2 tablets (40 mg total) as needed.    Labs: 04/23/2022 Creatinine 1.44, BUN 12, Potassium 4.7, Sodium 146, GFR 55  A complete set of results can be found in Results Review.  Recommendations: Left voice mail with ICM number and encouraged to call if experiencing any fluid symptoms.  Follow-up plan: ICM clinic phone appointment on 11/10/2022.   91 day device clinic remote transmission 11/28/2022.    EP/Cardiology Office Visits:  Recall 08/20/2023 with Dr. Graciela Husbands.    Copy of ICM check sent to Dr. Graciela Husbands.   3 month ICM trend: 10/06/2022.    12-14 Month ICM trend:     Karie Soda, RN 10/06/2022 1:03 PM

## 2022-10-06 NOTE — Telephone Encounter (Signed)
Remote ICM transmission received.  Attempted call to wife per DPR regarding ICM remote transmission and left detailed message per DPR.  Left ICM phone number and advised to return call for any fluid symptoms or questions. Next ICM remote transmission scheduled 11/10/2022.

## 2022-10-13 ENCOUNTER — Other Ambulatory Visit: Payer: Self-pay | Admitting: Family Medicine

## 2022-10-13 DIAGNOSIS — M79672 Pain in left foot: Secondary | ICD-10-CM

## 2022-10-13 DIAGNOSIS — M109 Gout, unspecified: Secondary | ICD-10-CM

## 2022-10-22 ENCOUNTER — Other Ambulatory Visit: Payer: Self-pay | Admitting: Internal Medicine

## 2022-10-22 ENCOUNTER — Other Ambulatory Visit: Payer: Self-pay | Admitting: Family Medicine

## 2022-10-22 DIAGNOSIS — S39012A Strain of muscle, fascia and tendon of lower back, initial encounter: Secondary | ICD-10-CM

## 2022-10-22 NOTE — Telephone Encounter (Signed)
Prescription refill request for Eliquis received. Indication: PAF Last office visit: 08/25/22  R Keitha Butte PA-C Scr: 1.44 on 04/23/22  Epic Age: 62 Weight: 137.7kg  Based on above findings Eliquis 5mg  twice daily is the appropriate dose.  Refill approved.

## 2022-10-29 ENCOUNTER — Ambulatory Visit (INDEPENDENT_AMBULATORY_CARE_PROVIDER_SITE_OTHER): Payer: Medicare Other | Admitting: Family Medicine

## 2022-10-29 ENCOUNTER — Encounter: Payer: Self-pay | Admitting: Family Medicine

## 2022-10-29 VITALS — BP 147/90 | HR 72 | Ht 62.0 in | Wt 311.0 lb

## 2022-10-29 DIAGNOSIS — I10 Essential (primary) hypertension: Secondary | ICD-10-CM

## 2022-10-29 DIAGNOSIS — E1122 Type 2 diabetes mellitus with diabetic chronic kidney disease: Secondary | ICD-10-CM

## 2022-10-29 DIAGNOSIS — N184 Chronic kidney disease, stage 4 (severe): Secondary | ICD-10-CM

## 2022-10-29 DIAGNOSIS — E785 Hyperlipidemia, unspecified: Secondary | ICD-10-CM | POA: Diagnosis not present

## 2022-10-29 DIAGNOSIS — N1831 Chronic kidney disease, stage 3a: Secondary | ICD-10-CM | POA: Diagnosis not present

## 2022-10-29 DIAGNOSIS — M170 Bilateral primary osteoarthritis of knee: Secondary | ICD-10-CM | POA: Diagnosis not present

## 2022-10-29 DIAGNOSIS — M25569 Pain in unspecified knee: Secondary | ICD-10-CM

## 2022-10-29 DIAGNOSIS — H5316 Psychophysical visual disturbances: Secondary | ICD-10-CM

## 2022-10-29 DIAGNOSIS — I5022 Chronic systolic (congestive) heart failure: Secondary | ICD-10-CM

## 2022-10-29 DIAGNOSIS — G8929 Other chronic pain: Secondary | ICD-10-CM

## 2022-10-29 LAB — POCT GLYCOSYLATED HEMOGLOBIN (HGB A1C): HbA1c, POC (controlled diabetic range): 6.8 % (ref 0.0–7.0)

## 2022-10-29 MED ORDER — FUROSEMIDE 40 MG PO TABS: ORAL_TABLET | ORAL | Status: DC

## 2022-10-29 MED ORDER — HYDROCODONE-ACETAMINOPHEN 5-325 MG PO TABS
ORAL_TABLET | ORAL | 0 refills | Status: DC
Start: 2022-10-29 — End: 2022-11-24

## 2022-10-29 NOTE — Assessment & Plan Note (Signed)
Continue current medications.  Will check some labs today.  His A1c shows excellent control at 6.8.

## 2022-10-29 NOTE — Assessment & Plan Note (Signed)
This cardiologist wanted him to see a nephrologist so I will set that up.

## 2022-10-29 NOTE — Progress Notes (Signed)
    CHIEF COMPLAINT / HPI: Follow-up diabetes mellitus: He has been eating a little bit better since his son is now staying with him again. 2.  Doing much better since his son has started living with them.  Not having any problems with visual hallucinations at night.  He continues on medication says he is sleeping well. 3.  His cardiologist was a little concerned that his legs were too edematous and cardiology wanted me to send him to a kidney doctor. 4.  Continues to have pretty severe knee pain.  Pain medication helps.  Even with pain medication he is pretty limited on his ability to get out and about.  Ends up spending much of his time in the apartment. 5.  Lower extremity edema: He is using compression stockings.  He does sleep in a recliner but does not actually elevate the foot portion.  Says he is just not comfortable that way but is not comfortable sleeping in the bed either.  His discomfort is mostly from habit rather than any shortness of breath, as he got in the habit of sleeping in the chair when he felt unsafe while living by himself.   PERTINENT  PMH / PSH: I have reviewed the patient's medications, allergies, past medical and surgical history, smoking status and updated in the EMR as appropriate.   OBJECTIVE:  BP (!) 147/90 (BP Location: Left Arm, Patient Position: Sitting, Cuff Size: Normal)   Pulse 72   Ht 5\' 2"  (1.575 m)   Wt (!) 311 lb (141.1 kg)   SpO2 99%   BMI 56.88 kg/m    ASSESSMENT / PLAN:   RENAL DISEASE, CHRONIC, STAGE III This cardiologist wanted him to see a nephrologist so I will set that up.  Type 2 diabetes mellitus with stage 4 chronic kidney disease, without long-term current use of insulin (HCC) Continue current medications.  Will check some labs today.  His A1c shows excellent control at 6.8.  Chronic systolic heart failure [facility]) Reviewed most recent cardiology notes.  They are little concerned that things fluid overloaded.  I discussed with him  and he is amenable to taking 1 or 2 Lasix daily.  He may alternate and he may see how much fluid he gains when he stays on the 1 pill a day.  I would like to see him back in 4 to 6 weeks so we can follow this up.  Check kidney function today.  [name] syndrome He seems to be doing really well on the current Seroquel level we will not change that.  Encounter for chronic pain management Continue current pain medicines and refill given.  Denny Levy MD

## 2022-10-29 NOTE — Assessment & Plan Note (Signed)
Continue current pain medicines and refill given.

## 2022-10-29 NOTE — Assessment & Plan Note (Signed)
Reviewed most recent cardiology notes.  They are little concerned that things fluid overloaded.  I discussed with him and he is amenable to taking 1 or 2 Lasix daily.  He may alternate and he may see how much fluid he gains when he stays on the 1 pill a day.  I would like to see him back in 4 to 6 weeks so we can follow this up.  Check kidney function today.

## 2022-10-29 NOTE — Patient Instructions (Addendum)
I have sent in a refill for your pain medicine and I asked them to make it available for refill this Friday, the 26.  I will send you a note about your labs.  I will set you up to see the kidney doctor and you should hear from them directly in the next couple of weeks.  They will call you on the phone to set up an appointment.  I do think it is probably a good idea to get them on board although your kidney function right now is stable, we may need their advice in the future.  Regarding your Lasix which is your water pill, I think you can take 1 or 2 a day depending on your lower extremity swelling.  I would like to see you back in about 6 weeks so we can reassess this.  For your convenience, my nurses already made an appointment for you in 6 weeks.  You will find that on the sheet above.  I also would like you to try to elevate your legs when seated if at all possible, even has only 15 minutes out of every hour.  I think that will help.  Great to see you!  Please call in the interim if you have any questions or concerns.

## 2022-10-29 NOTE — Assessment & Plan Note (Signed)
He seems to be doing really well on the current Seroquel level we will not change that.

## 2022-10-30 ENCOUNTER — Other Ambulatory Visit: Payer: Self-pay | Admitting: Family Medicine

## 2022-10-30 ENCOUNTER — Encounter: Payer: Self-pay | Admitting: Family Medicine

## 2022-10-30 LAB — COMPREHENSIVE METABOLIC PANEL
ALT: 9 IU/L (ref 0–44)
AST: 12 IU/L (ref 0–40)
Albumin: 3.9 g/dL (ref 3.9–4.9)
Alkaline Phosphatase: 63 IU/L (ref 44–121)
BUN/Creatinine Ratio: 13 (ref 10–24)
BUN: 21 mg/dL (ref 8–27)
Bilirubin Total: 0.5 mg/dL (ref 0.0–1.2)
CO2: 24 mmol/L (ref 20–29)
Calcium: 8.9 mg/dL (ref 8.6–10.2)
Glucose: 97 mg/dL (ref 70–99)
Potassium: 4.2 mmol/L (ref 3.5–5.2)
Sodium: 144 mmol/L (ref 134–144)
Total Protein: 6.9 g/dL (ref 6.0–8.5)
eGFR: 46 mL/min/{1.73_m2} — ABNORMAL LOW (ref >59)

## 2022-10-30 LAB — LIPID PANEL
Chol/HDL Ratio: 4.5 ratio (ref 0.0–5.0)
Cholesterol, Total: 154 mg/dL (ref 100–199)
Triglycerides: 124 mg/dL (ref 0–149)
VLDL Cholesterol Cal: 22 mg/dL (ref 5–40)

## 2022-11-10 ENCOUNTER — Ambulatory Visit: Payer: Medicare Other | Attending: Internal Medicine

## 2022-11-10 ENCOUNTER — Telehealth: Payer: Self-pay

## 2022-11-10 DIAGNOSIS — Z95810 Presence of automatic (implantable) cardiac defibrillator: Secondary | ICD-10-CM | POA: Diagnosis not present

## 2022-11-10 DIAGNOSIS — I5022 Chronic systolic (congestive) heart failure: Secondary | ICD-10-CM | POA: Diagnosis not present

## 2022-11-10 NOTE — Telephone Encounter (Signed)
Error

## 2022-11-11 ENCOUNTER — Other Ambulatory Visit: Payer: Self-pay | Admitting: Internal Medicine

## 2022-11-11 ENCOUNTER — Telehealth: Payer: Self-pay

## 2022-11-11 DIAGNOSIS — N183 Chronic kidney disease, stage 3 unspecified: Secondary | ICD-10-CM

## 2022-11-11 NOTE — Telephone Encounter (Signed)
Patient's wife calls nurse line regarding referral for Nephrology.   Spoke with Melvenia Beam. Referral diagnosis needs to be updated, as current dx is for chronic knee pain.   Advised patient's wife that we would keep her updated.   Veronda Prude, RN

## 2022-11-12 NOTE — Telephone Encounter (Signed)
Melvenia Beam I have updated this referral Sorry! Thanks Huntley Dec

## 2022-11-12 NOTE — Progress Notes (Signed)
EPIC Encounter for ICM Monitoring  Patient Name: [name] is a 62 y.o. male Date: 11/12/2022 Primary Care Physican: Nestor Ramp, MD Primary Cardiologist: Sharyn Lull  Electrophysiologist: Graciela Husbands 08/25/2022 Office Weight: 303 lbs                                                             Spoke with wife and heart failure questions reviewed.  Transmission results reviewed.  Pt asymptomatic for fluid accumulation.  Reports feeling well at this time and voices no complaints.     Optivol thoracic impedance suggesting normal fluid levels with the exception of possible fluid accumulation from 7/23-7/29.    Prescribed:  Furosemide 40 mg take 1 - 2 tablet(s) (40 mg total) as needed for fluid retention ( prescribed by Dr Jennette Kettle 7/24)   Labs: 10/29/2022 Creatinine 1.65, BUN 21, Potassium 4.2, Sodium 144, GFR 46 04/23/2022 Creatinine 1.44, BUN 12, Potassium 4.7, Sodium 146, GFR 55  A complete set of results can be found in Results Review.   Recommendations: No changes and encouraged to call if experiencing any fluid symptoms.  Pt has referral to [facility]   Follow-up plan: ICM clinic phone appointment on 12/15/2022.   91 day device clinic remote transmission 11/28/2022.     EP/Cardiology Office Visits:  Recall 08/20/2023 with Dr. Graciela Husbands.     Copy of ICM check sent to Dr. Graciela Husbands.   3 month ICM trend: 11/10/2022.    12-14 Month ICM trend:     Karie Soda, RN 11/12/2022 3:18 PM

## 2022-11-14 NOTE — Telephone Encounter (Signed)
I have sent the referral to:  [facility] [address] [place], [place] [address] Phone: [phone]  Dierdre Harness

## 2022-11-16 NOTE — Progress Notes (Deleted)
Subjective:   [name] is a 62 y.o. male who presents for an Initial Medicare Annual Wellness Visit.  Visit Complete: {VISITMETHOD:[phone]}  Patient Medicare AWV questionnaire was completed by the patient on ***; I have confirmed that all information answered by patient is correct and no changes since this date.  Review of Systems    ***       Objective:    There were no vitals filed for this visit. There is no height or weight on file to calculate BMI.     07/16/2022    8:40 AM 02/12/2022    9:38 AM 12/11/2021    8:40 AM 11/27/2021    9:57 AM 10/23/2021   10:46 AM 09/18/2021    9:10 AM 05/22/2021    9:04 AM  Advanced Directives  Does Patient Have a Medical Advance Directive? No No No No No No No  Would patient like information on creating a medical advance directive? No - Patient declined No - Patient declined  No - Patient declined   No - Patient declined    Current Medications (verified) Outpatient Encounter Medications as of 11/17/2022  Medication Sig   allopurinol (ZYLOPRIM) 100 MG tablet TAKE 1/2 TABLET BY MOUTH DAILY   brimonidine (ALPHAGAN) 0.2 % ophthalmic solution Place 1 drop into both eyes 2 (two) times daily.   carvedilol (COREG) 25 MG tablet Take 1 tablet (25 mg total) by mouth 2 (two) times daily with a meal.   cetirizine (ZYRTEC ALLERGY) 10 MG tablet Take 1 tablet (10 mg total) by mouth daily.   cyclobenzaprine (FLEXERIL) 5 MG tablet TAKE ONE TABLET BY MOUTH AT BEDTIME AS NEEDED muscle SPASMS   dapagliflozin propanediol (FARXIGA) 10 MG TABS tablet Take 1 tablet (10 mg total) by mouth daily.   diclofenac Sodium (VOLTAREN) 1 % GEL Apply 2 g topically 4 (four) times daily as needed (right knee pain).   dorzolamide-timolol (COSOPT) 22.3-6.8 MG/ML ophthalmic solution Place 1 drop into both eyes 2 (two) times daily.   ELIQUIS 5 MG TABS tablet TAKE ONE TABLET BY MOUTH TWICE DAILY   EPINEPHRINE 0.3 mg/0.3 mL IJ SOAJ injection Inject 0.3 mg into the muscle as  needed for anaphylaxis.   FLOVENT HFA 110 MCG/ACT inhaler Inhale 2 puffs into the lungs 2 (two) times daily.   furosemide (LASIX) 40 MG tablet Take 1 or 2 tablets daily as we have discussed for fluid retention.   glucose blood (ONETOUCH VERIO) test strip Use as instructed. Use up to four times daily as directed. (FOR ICD-10 E10.9, E11.9).   HYDROcodone-acetaminophen (NORCO/VICODIN) 5-325 MG tablet TAKE ONE TABLET BY MOUTH FOUR TIMES DAILY. MAY take ONE additional FOR a total of FIVE PER DAY   insulin glargine-yfgn (SEMGLEE) 100 UNIT/ML Pen Inject 25 Units into the skin daily.   ipratropium (ATROVENT) 0.03 % nasal spray Place 2 sprays into both nostrils every 12 (twelve) hours.   isosorbide mononitrate (ISMO) 20 MG tablet TAKE ONE TABLET BY MOUTH TWICE DAILY   latanoprost (XALATAN) 0.005 % ophthalmic solution Place 1 drop into both eyes every evening.   naloxone (NARCAN) nasal spray 4 mg/0.1 mL Place 1 sprays (0.4 mg total) into the nose as needed (accidental overdose).   nitroGLYCERIN (NITROSTAT) 0.4 MG SL tablet Place 0.4 mg under the tongue every 5 (five) minutes as needed for chest pain.   QUEtiapine (SEROQUEL) 50 MG tablet TAKE THREE TABLETS BY MOUTH AT BEDTIME   rosuvastatin (CRESTOR) 10 MG tablet TAKE ONE TABLET BY MOUTH EVERY  DAY   sacubitril-valsartan (ENTRESTO) 49-51 MG Take 1 tablet by mouth 2 (two) times daily.   SURE COMFORT PEN NEEDLES 32G X 6 MM MISC USE ONE UNITS EVERY MORNING, AT NOON evening, AND AT BEDTIME   TRUEPLUS INSULIN SYRINGE 31G X 5/16" 0.3 ML MISC    [DISCONTINUED] lisinopril (PRINIVIL,ZESTRIL) 40 MG tablet Take 40 mg by mouth 2 (two) times daily.     No facility-administered encounter medications on file as of 11/17/2022.    Allergies (verified) Bee pollen, Shellfish allergy, and Nsaids   History: Past Medical History:  Diagnosis Date   Arthritis    Arthritis of knee, degenerative 10/03/2011   Right > LEFT KNEE oa X-rays were not standing but showed some mild  sclerosis and joint space loss of the medial compartment of the right knee. History of arthroscopy right knee. Report of injury as a child but unclear exactly what that was.  :I am aware of 12/01/18 Rx by Dr. Jennette Kettle.  While wife was hospitalized, narcotic Rx went missing when in-home sitter left.  To my knowledge, this is the first mi   Automatic implantable cardioverter-defibrillator in situ 07/15/2013   Blind in both eyes 06/15/2013   Right Sees Dr Dione Booze   Cataracts, bilateral    right   Chronic gout due to renal impairment of multiple sites without tophus 04/12/2020   Chronic systolic heart failure (HCC) 02/18/2007   Qualifier: Diagnosis of  By: Bryson Ha MD, Gwendalyn Ege    Congestive heart failure (HCC)    a. s/p MDT single chamber ICD    Diabetes mellitus    does not check blood sugars at home   Diverticulosis    Encounter for chronic pain management 05/17/2014   Indication for chronic opioid: severe arthritis RIGHTknee / low back Medication and dose: hydrocodone---uses prn # pills per month: gets #120 pills to last TWO months Last UDS date: 05/17/2014 Pain contract signed (Y/N): Y 05/17/2014 Date narcotic database last reviewed (include red flags): 05/17/2014    Glaucoma    bilateral   Glaucoma 11/07/2015   Severe See Dr. Dione Booze   H/O TIA (transient ischemic attack) and stroke    Hyperlipidemia    Hyperlipidemia with target low density lipoprotein (LDL) cholesterol less than 100 mg/dL 07/14/8117   Hypertension    HYPERTENSION, BENIGN 02/18/2007   Qualifier: Diagnosis of  By: Jennette Kettle MD, Ronald Lobo of both feet 04/12/2020   NICM (nonischemic cardiomyopathy) (HCC) 05/30/2015   Obesity    OBESITY, MORBID 05/21/2010   Qualifier: Diagnosis of  By: Graciela Husbands, MD, Susie Cassette    Obstructive sleep apnea 03/27/2009   Qualifier: Diagnosis of  By: Sherral Hammers, RN, BSN, Melanie     Paroxysmal atrial fibrillation (HCC) 09/21/2019   Paroxysmal VT (HCC) 06/22/2019   RENAL DISEASE, CHRONIC, STAGE III  02/18/2007   SHOULDER PAIN, LEFT 08/01/2009   History of left shoulder surgery.     Single implantable cardioverter-defibrillator (ICD) in situ 06/17/2011   Type 2 diabetes mellitus with stage 4 chronic kidney disease, without long-term current use of insulin (HCC) 08/25/2007   Past Surgical History:  Procedure Laterality Date   CARDIAC CATHETERIZATION  10/24/2003   EF of 45-50%   CARDIAC DEFIBRILLATOR PLACEMENT  2008   CATARACT EXTRACTION  2012   right   EP IMPLANTABLE DEVICE N/A 05/30/2015   Procedure: ICD Generator Changeout;  Surgeon: Duke Salvia, MD;  Location: [facility] INVASIVE CV LAB;  Service: Cardiovascular;  Laterality: N/A;   KNEE  SURGERY Right 2000   SHOULDER SURGERY  2012   left   Family History  Problem Relation Age of Onset   Colon cancer Father    Esophageal cancer Neg Hx    Rectal cancer Neg Hx    Stomach cancer Neg Hx    Social History   Socioeconomic History   Marital status: Legally Separated    Spouse name: Not on file   Number of children: Not on file   Years of education: Not on file   Highest education level: Not on file  Occupational History   Not on file  Tobacco Use   Smoking status: Never   Smokeless tobacco: Never  Vaping Use   Vaping status: Never Used  Substance and Sexual Activity   Alcohol use: No   Drug use: No   Sexual activity: Not on file  Other Topics Concern   Not on file  Social History Narrative   Not on file   Social Determinants of Health   Financial Resource Strain: Not on file  Food Insecurity: Not on file  Transportation Needs: No Transportation Needs (12/26/2021)   PRAPARE - Administrator, Civil Service (Medical): No    Lack of Transportation (Non-Medical): No  Physical Activity: Not on file  Stress: Not on file  Social Connections: Not on file    Tobacco Counseling Counseling given: Not Answered   Clinical Intake:                        Activities of Daily Living     No data to  display          Patient Care Team: Nestor Ramp, MD as PCP - General Duke Salvia, MD as PCP - Electrophysiology (Cardiology) Ernesto Rutherford, MD as Consulting Physician (Ophthalmology)  Indicate any recent Medical Services you may have received from other than [facility] providers in the past year (date may be approximate).     Assessment:   This is a routine wellness examination for Awan.  Hearing/Vision screen No results found.  Dietary issues and exercise activities discussed:     Goals Addressed   None    Depression Screen    10/29/2022    9:56 AM 07/16/2022    8:48 AM 04/23/2022    8:38 AM 02/12/2022    9:39 AM 12/26/2021   10:50 AM 12/11/2021    8:40 AM 11/27/2021    9:57 AM  PHQ 2/9 Scores  PHQ - 2 Score 0 0 0 0 0 0 1  PHQ- 9 Score 0 0 0 0  0 4    Fall Risk    10/29/2022    9:56 AM 11/14/2020    8:36 AM 10/19/2019    9:30 AM 06/22/2019    8:39 AM 02/23/2019    8:39 AM  Fall Risk   Falls in the past year? 0 0 0 0 0  Number falls in past yr: 0   0 0  Injury with Fall? 0      Risk for fall due to : No Fall Risks      Follow up Falls evaluation completed   Falls evaluation completed Falls evaluation completed    MEDICARE RISK AT HOME:   TIMED UP AND GO:  Was the test performed? No    Cognitive Function:        Immunizations Immunization History  Administered Date(s) Administered   COVID-19, mRNA, vaccine(Comirnaty)12 years and older 02/12/2022  Influenza Split 02/11/2012   Influenza Whole 01/10/2008   Influenza,inj,Quad PF,6+ Mos 01/26/2013, 12/14/2013, 01/17/2015, 02/13/2016, 12/10/2016, 02/03/2018, 12/01/2018, 01/18/2020, 02/13/2021, 02/12/2022   PFIZER(Purple Top)SARS-COV-2 Vaccination 07/08/2019, 07/29/2019, 01/18/2020   Pfizer Covid-19 Vaccine Bivalent Booster 59yrs & up 02/13/2021   Pneumococcal Polysaccharide-23 05/17/2014   Td 11/17/2007   Tdap 12/01/2018    TDAP status: Up to date  Flu Vaccine status: Due, Education has been  provided regarding the importance of this vaccine. Advised may receive this vaccine at local pharmacy or Health Dept. Aware to provide a copy of the vaccination record if obtained from local pharmacy or Health Dept. Verbalized acceptance and understanding.  Pneumococcal vaccine status: Up to date  Covid-19 vaccine status: Information provided on how to obtain vaccines.   Qualifies for Shingles Vaccine? Yes   Zostavax completed No   Shingrix Completed?: No.    Education has been provided regarding the importance of this vaccine. Patient has been advised to call insurance company to determine out of pocket expense if they have not yet received this vaccine. Advised may also receive vaccine at local pharmacy or Health Dept. Verbalized acceptance and understanding.  Screening Tests Health Maintenance  Topic Date Due   Medicare Annual Wellness (AWV)  Never done   Diabetic kidney evaluation - Urine ACR  Never done   Zoster Vaccines- Shingrix (1 of 2) Never done   Colonoscopy  07/22/2016   FOOT EXAM  05/07/2021   COVID-19 Vaccine (6 - 2023-24 season) 04/09/2022   INFLUENZA VACCINE  11/06/2022   HEMOGLOBIN A1C  05/01/2023   Diabetic kidney evaluation - eGFR measurement  10/29/2023   DTaP/Tdap/Td (3 - Td or Tdap) 11/30/2028   Hepatitis C Screening  Completed   HIV Screening  Completed   HPV VACCINES  Aged Out    Health Maintenance  Health Maintenance Due  Topic Date Due   Medicare Annual Wellness (AWV)  Never done   Diabetic kidney evaluation - Urine ACR  Never done   Zoster Vaccines- Shingrix (1 of 2) Never done   Colonoscopy  07/22/2016   FOOT EXAM  05/07/2021   COVID-19 Vaccine (6 - 2023-24 season) 04/09/2022   INFLUENZA VACCINE  11/06/2022    {Colorectal cancer screening:[id number]}  Lung Cancer Screening: (Low Dose CT Chest recommended if Age 51-80 years, 20 pack-year currently smoking OR have quit w/in 15years.) does not qualify.   Lung Cancer Screening Referral:  n/a  Additional Screening:  Hepatitis C Screening: does qualify; Completed 11/07/15  Vision Screening: Recommended annual ophthalmology exams for early detection of glaucoma and other disorders of the eye. Is the patient up to date with their annual eye exam?  {YES/NO:21197} Who is the provider or what is the name of the office in which the patient attends annual eye exams? *** If pt is not established with a provider, would they like to be referred to a provider to establish care? {YES/NO:21197}.   Dental Screening: Recommended annual dental exams for proper oral hygiene  Diabetic Foot Exam: Diabetic Foot Exam: Overdue, Pt has been advised about the importance in completing this exam. Pt is scheduled for diabetic foot exam on at next office visit .  Community Resource Referral / Chronic Care Management: CRR required this visit?  {YES/NO:21197}  CCM required this visit?  {CCM Required choices:[phone]}    Plan:     I have personally reviewed and noted the following in the patient's chart:   Medical and social history Use of alcohol, tobacco or illicit drugs  Current  medications and supplements including opioid prescriptions. {Opioid Prescriptions:[phone]} Functional ability and status Nutritional status Physical activity Advanced directives List of other physicians Hospitalizations, surgeries, and ER visits in previous 12 months Vitals Screenings to include cognitive, depression, and falls Referrals and appointments  In addition, I have reviewed and discussed with patient certain preventive protocols, quality metrics, and best practice recommendations. A written personalized care plan for preventive services as well as general preventive health recommendations were provided to patient.     Kandis Fantasia [place], [place]   1/61/0960   After Visit Summary: {CHL AMB AWV After Visit Summary:[phone]}  Nurse Notes: ***

## 2022-11-17 ENCOUNTER — Ambulatory Visit: Payer: Medicare Other

## 2022-11-23 NOTE — Patient Instructions (Signed)
 [name] , Thank you for taking time to come for your Medicare Wellness Visit. I appreciate your ongoing commitment to your health goals. Please review the following plan we discussed and let me know if I can assist you in the future.   Referrals/Orders/Follow-Ups/Clinician Recommendations: Aim for 30 minutes of exercise or brisk walking, 6-8 glasses of water, and 5 servings of fruits and vegetables each day.  This is a list of the screening recommended for you and due dates:  Health Maintenance  Topic Date Due   Medicare Annual Wellness Visit  Never done   Yearly kidney health urinalysis for diabetes  Never done   Zoster (Shingles) Vaccine (1 of 2) Never done   Colon Cancer Screening  07/22/2016   Complete foot exam   05/07/2021   COVID-19 Vaccine (6 - 2023-24 season) 04/09/2022   Flu Shot  11/06/2022   Hemoglobin A1C  05/01/2023   Yearly kidney function blood test for diabetes  10/29/2023   DTaP/Tdap/Td vaccine (3 - Td or Tdap) 11/30/2028   Hepatitis C Screening  Completed   HIV Screening  Completed   HPV Vaccine  Aged Out    Advanced directives: (ACP Link)Information on Advanced Care Planning can be found at [facility] of [place] Advance Health Care Directives Advance Health Care Directives ([email or web address]   Next Medicare Annual Wellness Visit scheduled for next year: Yes  Preventive Care 40-64 Years, Male Preventive care refers to lifestyle choices and visits with your health care provider that can promote health and wellness. What does preventive care include? A yearly physical exam. This is also called an annual well check. Dental exams once or twice a year. Routine eye exams. Ask your health care provider how often you should have your eyes checked. Personal lifestyle choices, including: Daily care of your teeth and gums. Regular physical activity. Eating a healthy diet. Avoiding tobacco and drug use. Limiting alcohol use. Practicing safe sex. Taking  low-dose aspirin every day starting at age 66. What happens during an annual well check? The services and screenings done by your health care provider during your annual well check will depend on your age, overall health, lifestyle risk factors, and family history of disease. Counseling  Your health care provider may ask you questions about your: Alcohol use. Tobacco use. Drug use. Emotional well-being. Home and relationship well-being. Sexual activity. Eating habits. Work and work Astronomer. Screening  You may have the following tests or measurements: Height, weight, and BMI. Blood pressure. Lipid and cholesterol levels. These may be checked every 5 years, or more frequently if you are over 57 years old. Skin check. Lung cancer screening. You may have this screening every year starting at age 3 if you have a 30-pack-year history of smoking and currently smoke or have quit within the past 15 years. Fecal occult blood test (FOBT) of the stool. You may have this test every year starting at age 16. Flexible sigmoidoscopy or colonoscopy. You may have a sigmoidoscopy every 5 years or a colonoscopy every 10 years starting at age 49. Prostate cancer screening. Recommendations will vary depending on your family history and other risks. Hepatitis C blood test. Hepatitis B blood test. Sexually transmitted disease (STD) testing. Diabetes screening. This is done by checking your blood sugar (glucose) after you have not eaten for a while (fasting). You may have this done every 1-3 years. Discuss your test results, treatment options, and if necessary, the need for more tests with your health care provider. Vaccines  Your health care provider may recommend certain vaccines, such as: Influenza vaccine. This is recommended every year. Tetanus, diphtheria, and acellular pertussis (Tdap, Td) vaccine. You may need a Td booster every 10 years. Zoster vaccine. You may need this after age  18. Pneumococcal 13-valent conjugate (PCV13) vaccine. You may need this if you have certain conditions and have not been vaccinated. Pneumococcal polysaccharide (PPSV23) vaccine. You may need one or two doses if you smoke cigarettes or if you have certain conditions. Talk to your health care provider about which screenings and vaccines you need and how often you need them. This information is not intended to replace advice given to you by your health care provider. Make sure you discuss any questions you have with your health care provider. Document Released: 04/20/2015 Document Revised: 12/12/2015 Document Reviewed: 01/23/2015 Elsevier Interactive Patient Education  2017 [employer].  Fall Prevention in the Home Falls can cause injuries. They can happen to people of all ages. There are many things you can do to make your home safe and to help prevent falls. What can I do on the outside of my home? Regularly fix the edges of walkways and driveways and fix any cracks. Remove anything that might make you trip as you walk through a door, such as a raised step or threshold. Trim any bushes or trees on the path to your home. Use bright outdoor lighting. Clear any walking paths of anything that might make someone trip, such as rocks or tools. Regularly check to see if handrails are loose or broken. Make sure that both sides of any steps have handrails. Any raised decks and porches should have guardrails on the edges. Have any leaves, snow, or ice cleared regularly. Use sand or salt on walking paths during winter. Clean up any spills in your garage right away. This includes oil or grease spills. What can I do in the bathroom? Use night lights. Install grab bars by the toilet and in the tub and shower. Do not use towel bars as grab bars. Use non-skid mats or decals in the tub or shower. If you need to sit down in the shower, use a plastic, non-slip stool. Keep the floor dry. Clean up any water  that spills on the floor as soon as it happens. Remove soap buildup in the tub or shower regularly. Attach bath mats securely with double-sided non-slip rug tape. Do not have throw rugs and other things on the floor that can make you trip. What can I do in the bedroom? Use night lights. Make sure that you have a light by your bed that is easy to reach. Do not use any sheets or blankets that are too big for your bed. They should not hang down onto the floor. Have a firm chair that has side arms. You can use this for support while you get dressed. Do not have throw rugs and other things on the floor that can make you trip. What can I do in the kitchen? Clean up any spills right away. Avoid walking on wet floors. Keep items that you use a lot in easy-to-reach places. If you need to reach something above you, use a strong step stool that has a grab bar. Keep electrical cords out of the way. Do not use floor polish or wax that makes floors slippery. If you must use wax, use non-skid floor wax. Do not have throw rugs and other things on the floor that can make you trip. What can I  do with my stairs? Do not leave any items on the stairs. Make sure that there are handrails on both sides of the stairs and use them. Fix handrails that are broken or loose. Make sure that handrails are as long as the stairways. Check any carpeting to make sure that it is firmly attached to the stairs. Fix any carpet that is loose or worn. Avoid having throw rugs at the top or bottom of the stairs. If you do have throw rugs, attach them to the floor with carpet tape. Make sure that you have a light switch at the top of the stairs and the bottom of the stairs. If you do not have them, ask someone to add them for you. What else can I do to help prevent falls? Wear shoes that: Do not have high heels. Have rubber bottoms. Are comfortable and fit you well. Are closed at the toe. Do not wear sandals. If you use a  stepladder: Make sure that it is fully opened. Do not climb a closed stepladder. Make sure that both sides of the stepladder are locked into place. Ask someone to hold it for you, if possible. Clearly mark and make sure that you can see: Any grab bars or handrails. First and last steps. Where the edge of each step is. Use tools that help you move around (mobility aids) if they are needed. These include: Canes. Walkers. Scooters. Crutches. Turn on the lights when you go into a dark area. Replace any light bulbs as soon as they burn out. Set up your furniture so you have a clear path. Avoid moving your furniture around. If any of your floors are uneven, fix them. If there are any pets around you, be aware of where they are. Review your medicines with your doctor. Some medicines can make you feel dizzy. This can increase your chance of falling. Ask your doctor what other things that you can do to help prevent falls. This information is not intended to replace advice given to you by your health care provider. Make sure you discuss any questions you have with your health care provider. Document Released: 01/18/2009 Document Revised: 08/30/2015 Document Reviewed: 04/28/2014 Elsevier Interactive Patient Education  2017 [employer].

## 2022-11-23 NOTE — Progress Notes (Unsigned)
Subjective:   [name] is a 62 y.o. male who presents for an Initial Medicare Annual Wellness Visit.  Visit Complete: {VISITMETHOD:[phone]}  Patient Medicare AWV questionnaire was completed by the patient on ***; I have confirmed that all information answered by patient is correct and no changes since this date.  Vital Signs: {telehealth vitals:30100}  Review of Systems    ***       Objective:    There were no vitals filed for this visit. There is no height or weight on file to calculate BMI.     07/16/2022    8:40 AM 02/12/2022    9:38 AM 12/11/2021    8:40 AM 11/27/2021    9:57 AM 10/23/2021   10:46 AM 09/18/2021    9:10 AM 05/22/2021    9:04 AM  Advanced Directives  Does Patient Have a Medical Advance Directive? No No No No No No No  Would patient like information on creating a medical advance directive? No - Patient declined No - Patient declined  No - Patient declined   No - Patient declined    Current Medications (verified) Outpatient Encounter Medications as of 11/24/2022  Medication Sig   allopurinol (ZYLOPRIM) 100 MG tablet TAKE 1/2 TABLET BY MOUTH DAILY   brimonidine (ALPHAGAN) 0.2 % ophthalmic solution Place 1 drop into both eyes 2 (two) times daily.   carvedilol (COREG) 25 MG tablet Take 1 tablet (25 mg total) by mouth 2 (two) times daily with a meal.   cetirizine (ZYRTEC ALLERGY) 10 MG tablet Take 1 tablet (10 mg total) by mouth daily.   cyclobenzaprine (FLEXERIL) 5 MG tablet TAKE ONE TABLET BY MOUTH AT BEDTIME AS NEEDED muscle SPASMS   dapagliflozin propanediol (FARXIGA) 10 MG TABS tablet Take 1 tablet (10 mg total) by mouth daily.   diclofenac Sodium (VOLTAREN) 1 % GEL Apply 2 g topically 4 (four) times daily as needed (right knee pain).   dorzolamide-timolol (COSOPT) 22.3-6.8 MG/ML ophthalmic solution Place 1 drop into both eyes 2 (two) times daily.   ELIQUIS 5 MG TABS tablet TAKE ONE TABLET BY MOUTH TWICE DAILY   EPINEPHRINE 0.3 mg/0.3 mL IJ SOAJ  injection Inject 0.3 mg into the muscle as needed for anaphylaxis.   FLOVENT HFA 110 MCG/ACT inhaler Inhale 2 puffs into the lungs 2 (two) times daily.   furosemide (LASIX) 40 MG tablet Take 1 or 2 tablets daily as we have discussed for fluid retention.   glucose blood (ONETOUCH VERIO) test strip Use as instructed. Use up to four times daily as directed. (FOR ICD-10 E10.9, E11.9).   HYDROcodone-acetaminophen (NORCO/VICODIN) 5-325 MG tablet TAKE ONE TABLET BY MOUTH FOUR TIMES DAILY. MAY take ONE additional FOR a total of FIVE PER DAY   insulin glargine-yfgn (SEMGLEE) 100 UNIT/ML Pen Inject 25 Units into the skin daily.   ipratropium (ATROVENT) 0.03 % nasal spray Place 2 sprays into both nostrils every 12 (twelve) hours.   isosorbide mononitrate (ISMO) 20 MG tablet TAKE ONE TABLET BY MOUTH TWICE DAILY   latanoprost (XALATAN) 0.005 % ophthalmic solution Place 1 drop into both eyes every evening.   naloxone (NARCAN) nasal spray 4 mg/0.1 mL Place 1 sprays (0.4 mg total) into the nose as needed (accidental overdose).   nitroGLYCERIN (NITROSTAT) 0.4 MG SL tablet Place 0.4 mg under the tongue every 5 (five) minutes as needed for chest pain.   QUEtiapine (SEROQUEL) 50 MG tablet TAKE THREE TABLETS BY MOUTH AT BEDTIME   rosuvastatin (CRESTOR) 10 MG tablet TAKE  ONE TABLET BY MOUTH EVERY DAY   sacubitril-valsartan (ENTRESTO) 49-51 MG Take 1 tablet by mouth 2 (two) times daily.   SURE COMFORT PEN NEEDLES 32G X 6 MM MISC USE ONE UNITS EVERY MORNING, AT NOON evening, AND AT BEDTIME   TRUEPLUS INSULIN SYRINGE 31G X 5/16" 0.3 ML MISC    [DISCONTINUED] lisinopril (PRINIVIL,ZESTRIL) 40 MG tablet Take 40 mg by mouth 2 (two) times daily.     No facility-administered encounter medications on file as of 11/24/2022.    Allergies (verified) Bee pollen, Shellfish allergy, and Nsaids   History: Past Medical History:  Diagnosis Date   Arthritis    Arthritis of knee, degenerative 10/03/2011   Right > LEFT KNEE oa  X-rays were not standing but showed some mild sclerosis and joint space loss of the medial compartment of the right knee. History of arthroscopy right knee. Report of injury as a child but unclear exactly what that was.  :I am aware of 12/01/18 Rx by Dr. Jennette Kettle.  While wife was hospitalized, narcotic Rx went missing when in-home sitter left.  To my knowledge, this is the first mi   Automatic implantable cardioverter-defibrillator in situ 07/15/2013   Blind in both eyes 06/15/2013   Right Sees Dr Dione Booze   Cataracts, bilateral    right   Chronic gout due to renal impairment of multiple sites without tophus 04/12/2020   Chronic systolic heart failure (HCC) 02/18/2007   Qualifier: Diagnosis of  By: Bryson Ha MD, Gwendalyn Ege    Congestive heart failure (HCC)    a. s/p MDT single chamber ICD    Diabetes mellitus    does not check blood sugars at home   Diverticulosis    Encounter for chronic pain management 05/17/2014   Indication for chronic opioid: severe arthritis RIGHTknee / low back Medication and dose: hydrocodone---uses prn # pills per month: gets #120 pills to last TWO months Last UDS date: 05/17/2014 Pain contract signed (Y/N): Y 05/17/2014 Date narcotic database last reviewed (include red flags): 05/17/2014    Glaucoma    bilateral   Glaucoma 11/07/2015   Severe See Dr. Dione Booze   H/O TIA (transient ischemic attack) and stroke    Hyperlipidemia    Hyperlipidemia with target low density lipoprotein (LDL) cholesterol less than 100 mg/dL 8/65/7846   Hypertension    HYPERTENSION, BENIGN 02/18/2007   Qualifier: Diagnosis of  By: Jennette Kettle MD, Ronald Lobo of both feet 04/12/2020   NICM (nonischemic cardiomyopathy) (HCC) 05/30/2015   Obesity    OBESITY, MORBID 05/21/2010   Qualifier: Diagnosis of  By: Graciela Husbands, MD, Susie Cassette    Obstructive sleep apnea 03/27/2009   Qualifier: Diagnosis of  By: Sherral Hammers, RN, BSN, Melanie     Paroxysmal atrial fibrillation (HCC) 09/21/2019   Paroxysmal VT (HCC)  06/22/2019   RENAL DISEASE, CHRONIC, STAGE III 02/18/2007   SHOULDER PAIN, LEFT 08/01/2009   History of left shoulder surgery.     Single implantable cardioverter-defibrillator (ICD) in situ 06/17/2011   Type 2 diabetes mellitus with stage 4 chronic kidney disease, without long-term current use of insulin (HCC) 08/25/2007   Past Surgical History:  Procedure Laterality Date   CARDIAC CATHETERIZATION  10/24/2003   EF of 45-50%   CARDIAC DEFIBRILLATOR PLACEMENT  2008   CATARACT EXTRACTION  2012   right   EP IMPLANTABLE DEVICE N/A 05/30/2015   Procedure: ICD Generator Changeout;  Surgeon: Duke Salvia, MD;  Location: [facility] INVASIVE CV LAB;  Service: Cardiovascular;  Laterality: N/A;   KNEE SURGERY Right 2000   SHOULDER SURGERY  2012   left   Family History  Problem Relation Age of Onset   Colon cancer Father    Esophageal cancer Neg Hx    Rectal cancer Neg Hx    Stomach cancer Neg Hx    Social History   Socioeconomic History   Marital status: Legally Separated    Spouse name: Not on file   Number of children: Not on file   Years of education: Not on file   Highest education level: Not on file  Occupational History   Not on file  Tobacco Use   Smoking status: Never   Smokeless tobacco: Never  Vaping Use   Vaping status: Never Used  Substance and Sexual Activity   Alcohol use: No   Drug use: No   Sexual activity: Not on file  Other Topics Concern   Not on file  Social History Narrative   Not on file   Social Determinants of Health   Financial Resource Strain: Not on file  Food Insecurity: Not on file  Transportation Needs: No Transportation Needs (12/26/2021)   PRAPARE - Administrator, Civil Service (Medical): No    Lack of Transportation (Non-Medical): No  Physical Activity: Not on file  Stress: Not on file  Social Connections: Not on file    Tobacco Counseling Counseling given: Not Answered   Clinical Intake:                         Activities of Daily Living     No data to display          Patient Care Team: Nestor Ramp, MD as PCP - General Duke Salvia, MD as PCP - Electrophysiology (Cardiology) Ernesto Rutherford, MD as Consulting Physician (Ophthalmology)  Indicate any recent Medical Services you may have received from other than [facility] providers in the past year (date may be approximate).     Assessment:   This is a routine wellness examination for Taras.  Hearing/Vision screen No results found.  Dietary issues and exercise activities discussed:     Goals Addressed   None    Depression Screen    10/29/2022    9:56 AM 07/16/2022    8:48 AM 04/23/2022    8:38 AM 02/12/2022    9:39 AM 12/26/2021   10:50 AM 12/11/2021    8:40 AM 11/27/2021    9:57 AM  PHQ 2/9 Scores  PHQ - 2 Score 0 0 0 0 0 0 1  PHQ- 9 Score 0 0 0 0  0 4    Fall Risk    10/29/2022    9:56 AM 11/14/2020    8:36 AM 10/19/2019    9:30 AM 06/22/2019    8:39 AM 02/23/2019    8:39 AM  Fall Risk   Falls in the past year? 0 0 0 0 0  Number falls in past yr: 0   0 0  Injury with Fall? 0      Risk for fall due to : No Fall Risks      Follow up Falls evaluation completed   Falls evaluation completed Falls evaluation completed    MEDICARE RISK AT HOME:   TIMED UP AND GO:  Was the test performed? No    Cognitive Function:        Immunizations Immunization History  Administered Date(s) Administered   COVID-19, mRNA, vaccine(Comirnaty)12  years and older 02/12/2022   Influenza Split 02/11/2012   Influenza Whole 01/10/2008   Influenza,inj,Quad PF,6+ Mos 01/26/2013, 12/14/2013, 01/17/2015, 02/13/2016, 12/10/2016, 02/03/2018, 12/01/2018, 01/18/2020, 02/13/2021, 02/12/2022   PFIZER(Purple Top)SARS-COV-2 Vaccination 07/08/2019, 07/29/2019, 01/18/2020   Pfizer Covid-19 Vaccine Bivalent Booster 7yrs & up 02/13/2021   Pneumococcal Polysaccharide-23 05/17/2014   Td 11/17/2007   Tdap 12/01/2018    TDAP status: Up to  date  Flu Vaccine status: Due, Education has been provided regarding the importance of this vaccine. Advised may receive this vaccine at local pharmacy or Health Dept. Aware to provide a copy of the vaccination record if obtained from local pharmacy or Health Dept. Verbalized acceptance and understanding.  Pneumococcal vaccine status: Up to date  Covid-19 vaccine status: Information provided on how to obtain vaccines.   Qualifies for Shingles Vaccine? Yes   Zostavax completed No   Shingrix Completed?: No.    Education has been provided regarding the importance of this vaccine. Patient has been advised to call insurance company to determine out of pocket expense if they have not yet received this vaccine. Advised may also receive vaccine at local pharmacy or Health Dept. Verbalized acceptance and understanding.  Screening Tests Health Maintenance  Topic Date Due   Medicare Annual Wellness (AWV)  Never done   Diabetic kidney evaluation - Urine ACR  Never done   Zoster Vaccines- Shingrix (1 of 2) Never done   Colonoscopy  07/22/2016   FOOT EXAM  05/07/2021   COVID-19 Vaccine (6 - 2023-24 season) 04/09/2022   INFLUENZA VACCINE  11/06/2022   HEMOGLOBIN A1C  05/01/2023   Diabetic kidney evaluation - eGFR measurement  10/29/2023   DTaP/Tdap/Td (3 - Td or Tdap) 11/30/2028   Hepatitis C Screening  Completed   HIV Screening  Completed   HPV VACCINES  Aged Out    Health Maintenance  Health Maintenance Due  Topic Date Due   Medicare Annual Wellness (AWV)  Never done   Diabetic kidney evaluation - Urine ACR  Never done   Zoster Vaccines- Shingrix (1 of 2) Never done   Colonoscopy  07/22/2016   FOOT EXAM  05/07/2021   COVID-19 Vaccine (6 - 2023-24 season) 04/09/2022   INFLUENZA VACCINE  11/06/2022    {Colorectal cancer screening:[id number]}  Lung Cancer Screening: (Low Dose CT Chest recommended if Age 19-80 years, 20 pack-year currently smoking OR have quit w/in 15years.) does not  qualify.   Lung Cancer Screening Referral: n/a  Additional Screening:  Hepatitis C Screening: does qualify; Completed 11/07/15  Vision Screening: Recommended annual ophthalmology exams for early detection of glaucoma and other disorders of the eye. Is the patient up to date with their annual eye exam?  {YES/NO:21197} Who is the provider or what is the name of the office in which the patient attends annual eye exams? *** If pt is not established with a provider, would they like to be referred to a provider to establish care? {YES/NO:21197}.   Dental Screening: Recommended annual dental exams for proper oral hygiene  Diabetic Foot Exam: Diabetic Foot Exam: Overdue, Pt has been advised about the importance in completing this exam. Pt is scheduled for diabetic foot exam on at next office visit .  Community Resource Referral / Chronic Care Management: CRR required this visit?  {YES/NO:21197}  CCM required this visit?  {CCM Required choices:[phone]}    Plan:     I have personally reviewed and noted the following in the patient's chart:   Medical and social history Use of alcohol,  tobacco or illicit drugs  Current medications and supplements including opioid prescriptions. {Opioid Prescriptions:[phone]} Functional ability and status Nutritional status Physical activity Advanced directives List of other physicians Hospitalizations, surgeries, and ER visits in previous 12 months Vitals Screenings to include cognitive, depression, and falls Referrals and appointments  In addition, I have reviewed and discussed with patient certain preventive protocols, quality metrics, and best practice recommendations. A written personalized care plan for preventive services as well as general preventive health recommendations were provided to patient.     Kandis Fantasia [place], [place]   11/15/9145   After Visit Summary: {CHL AMB AWV After Visit Summary:[phone]}  Nurse Notes:  ***

## 2022-11-24 ENCOUNTER — Other Ambulatory Visit: Payer: Self-pay

## 2022-11-24 ENCOUNTER — Ambulatory Visit: Payer: Medicare Other

## 2022-11-24 VITALS — Ht 62.0 in | Wt 311.0 lb

## 2022-11-24 DIAGNOSIS — G8929 Other chronic pain: Secondary | ICD-10-CM

## 2022-11-24 DIAGNOSIS — M170 Bilateral primary osteoarthritis of knee: Secondary | ICD-10-CM

## 2022-11-24 DIAGNOSIS — Z0000 Encounter for general adult medical examination without abnormal findings: Secondary | ICD-10-CM

## 2022-11-25 MED ORDER — HYDROCODONE-ACETAMINOPHEN 5-325 MG PO TABS
ORAL_TABLET | ORAL | 0 refills | Status: DC
Start: 2022-11-25 — End: 2022-12-23

## 2022-11-28 ENCOUNTER — Ambulatory Visit (INDEPENDENT_AMBULATORY_CARE_PROVIDER_SITE_OTHER): Payer: Medicare Other

## 2022-11-28 DIAGNOSIS — I428 Other cardiomyopathies: Secondary | ICD-10-CM

## 2022-11-28 LAB — CUP PACEART REMOTE DEVICE CHECK
Battery Remaining Longevity: 47 mo
Battery Voltage: 2.98 V
Brady Statistic RV Percent Paced: 0.32 %
Date Time Interrogation Session: 20240823022606
HighPow Impedance: 40 Ohm
HighPow Impedance: 49 Ohm
Implantable Lead Connection Status: 753985
Implantable Lead Implant Date: 20081114
Implantable Lead Location: 753860
Implantable Lead Model: 6947
Implantable Pulse Generator Implant Date: 20170222
Lead Channel Impedance Value: 342 Ohm
Lead Channel Impedance Value: 399 Ohm
Lead Channel Pacing Threshold Amplitude: 0.625 V
Lead Channel Pacing Threshold Pulse Width: 0.4 ms
Lead Channel Sensing Intrinsic Amplitude: 6.125 mV
Lead Channel Sensing Intrinsic Amplitude: 6.125 mV
Lead Channel Setting Pacing Amplitude: 2.5 V
Lead Channel Setting Pacing Pulse Width: 0.4 ms
Lead Channel Setting Sensing Sensitivity: 0.3 mV
Zone Setting Status: 755011

## 2022-12-03 NOTE — Progress Notes (Signed)
Remote ICD transmission.   

## 2022-12-09 ENCOUNTER — Other Ambulatory Visit: Payer: Self-pay | Admitting: Family Medicine

## 2022-12-09 DIAGNOSIS — S39012A Strain of muscle, fascia and tendon of lower back, initial encounter: Secondary | ICD-10-CM

## 2022-12-10 ENCOUNTER — Encounter: Payer: Self-pay | Admitting: Family Medicine

## 2022-12-10 ENCOUNTER — Ambulatory Visit (INDEPENDENT_AMBULATORY_CARE_PROVIDER_SITE_OTHER): Payer: Medicare Other | Admitting: Family Medicine

## 2022-12-10 VITALS — BP 128/80 | HR 71 | Ht 62.0 in | Wt 313.0 lb

## 2022-12-10 DIAGNOSIS — N1831 Chronic kidney disease, stage 3a: Secondary | ICD-10-CM | POA: Diagnosis not present

## 2022-12-10 DIAGNOSIS — N184 Chronic kidney disease, stage 4 (severe): Secondary | ICD-10-CM

## 2022-12-10 DIAGNOSIS — E1122 Type 2 diabetes mellitus with diabetic chronic kidney disease: Secondary | ICD-10-CM

## 2022-12-10 DIAGNOSIS — Z23 Encounter for immunization: Secondary | ICD-10-CM

## 2022-12-10 DIAGNOSIS — H5316 Psychophysical visual disturbances: Secondary | ICD-10-CM

## 2022-12-10 NOTE — Patient Instructions (Signed)
I am doing a couple of additional test for your kidney function today.  It had bumped up just a little bit last time we checked it.  I would also sent you a referral for the kidney doctors, [facility], and you said they cannot see you until November.  That is fine, I just want to get you established with them.  Anybody that has cardiovascular disease, diabetes and mild to moderate impairment of the kidney function I like to get them set up with the nephrologist early.  They will probably follow you yearly.  I will send the test I am doing today to them as well.  I would like to see you in 3 months, sooner if something comes up.  It was great to see you.  I will try to get that letter for your son done so that [name] can pick it up on Thursday.  Have a great week!

## 2022-12-11 LAB — BASIC METABOLIC PANEL
BUN/Creatinine Ratio: 13 (ref 10–24)
BUN: 21 mg/dL (ref 8–27)
CO2: 25 mmol/L (ref 20–29)
Calcium: 8.5 mg/dL — ABNORMAL LOW (ref 8.6–10.2)
Chloride: 106 mmol/L (ref 96–106)
Creatinine, Ser: 1.66 mg/dL — ABNORMAL HIGH (ref 0.76–1.27)
Glucose: 128 mg/dL — ABNORMAL HIGH (ref 70–99)
Potassium: 4 mmol/L (ref 3.5–5.2)
Sodium: 143 mmol/L (ref 134–144)
eGFR: 46 mL/min/{1.73_m2} — ABNORMAL LOW (ref >59)

## 2022-12-11 LAB — MICROALBUMIN / CREATININE URINE RATIO
Creatinine, Urine: 57.9 mg/dL
Microalb/Creat Ratio: 39 mg/g{creat} — ABNORMAL HIGH (ref 0–29)
Microalbumin, Urine: 22.7 ug/mL

## 2022-12-11 NOTE — Assessment & Plan Note (Signed)
He had significant improvement in his diabetic numbers at last visit and checked in with him to make sure he is still following the plan.  Follow-up in 2 months and we will recheck A1c at that time.  No diabetic medication changes today.

## 2022-12-11 NOTE — Assessment & Plan Note (Signed)
Brief discussion about his nighttime visual hallucinations.  They have been stable.  He is much happier having his son live at home but he does not want to consider tapering back off the medicine at this time.  Not sure whether that would even be an option or not.  Will follow-up in 2 to 3 months

## 2022-12-11 NOTE — Progress Notes (Signed)
    CHIEF COMPLAINT / HPI:   Here for follow-up of abnormal lab work.  I saw him a month ago and he had some elevated kidney function numbers.  Since then he has been followed up by his cardiologist and his medication regimen has not changed.[name]  He has otherwise been doing pretty well.  He is really happy that his son is living with him and functioning as his caretaker.  He needs a letter to that effect so his son can apply for some food stamps.  PERTINENT  PMH / PSH: I have reviewed the patient's medications, allergies, past medical and surgical history, smoking status and updated in the EMR as appropriate.   OBJECTIVE:  BP 128/80   Pulse 71   Ht 5\' 2"  (1.575 m)   Wt (!) 313 lb (142 kg)   SpO2 97%   BMI 57.25 kg/m   GENERAL: Well-developed male no acute distress HEENT: ENT: Blind in both eyes. ASSESSMENT / PLAN:   RENAL DISEASE, CHRONIC, STAGE III I had referred him to nephrology but they will not be able to see him until November.  I discussed that with him and I think it is fine and I think it is fine to wait until then.    Mostly I want him to be on board to follow him as his creatinine has been slowly climbing.  Creatinine has been slowly climbing.  Reviewed meds with him.Labs today.  Follow-up 3 months  Type 2 diabetes mellitus with stage 4 chronic kidney disease, without long-term current use of insulin (HCC) He had significant improvement in his diabetic numbers at last visit and checked in with him to make sure he is still following the plan.  Follow-up in 2 months and we will recheck A1c at that time.  No diabetic medication changes today.  [name] syndrome Brief discussion about his nighttime visual hallucinations.  They have been stable.  He is much happier having his son live at home but he does not want to consider tapering back off the medicine at this time.  Not sure whether that would even be an option or not.  Will follow-up in 2 to 3 months   Denny Levy MD

## 2022-12-11 NOTE — Assessment & Plan Note (Addendum)
I had referred him to nephrology but they will not be able to see him until November.  I discussed that with him and I think it is fine and I think it is fine to wait until then.    Mostly I want him to be on board to follow him as his creatinine has been slowly climbing.  Creatinine has been slowly climbing.  Reviewed meds with him.Labs today.  Follow-up 3 months

## 2022-12-12 ENCOUNTER — Telehealth: Payer: Self-pay

## 2022-12-12 NOTE — Telephone Encounter (Signed)
[name] leaves voicemail on nurse line in regards to a letter.   She reports she needs PCP to write a letter stating, [name] is the patients primary caregiver.  She reports this can be mailed to his home address.   Address confirmed in patients chart.   Will forward to PCP.

## 2022-12-13 ENCOUNTER — Encounter: Payer: Self-pay | Admitting: Family Medicine

## 2022-12-15 ENCOUNTER — Ambulatory Visit: Payer: Medicare Other | Attending: Internal Medicine

## 2022-12-15 DIAGNOSIS — Z95810 Presence of automatic (implantable) cardiac defibrillator: Secondary | ICD-10-CM

## 2022-12-15 DIAGNOSIS — I5022 Chronic systolic (congestive) heart failure: Secondary | ICD-10-CM

## 2022-12-16 ENCOUNTER — Encounter: Payer: Self-pay | Admitting: Family Medicine

## 2022-12-17 ENCOUNTER — Other Ambulatory Visit (HOSPITAL_COMMUNITY): Payer: Self-pay

## 2022-12-17 NOTE — Telephone Encounter (Signed)
The address is patients chart is correct.   I confirmed this with [name].   [name] aware letter has been written and will be mailed.

## 2022-12-19 ENCOUNTER — Other Ambulatory Visit: Payer: Self-pay | Admitting: Family Medicine

## 2022-12-19 NOTE — Progress Notes (Signed)
EPIC Encounter for ICM Monitoring  Patient Name: [name] is a 62 y.o. male Date: 12/19/2022 Primary Care Physican: Nestor Ramp, MD Primary Cardiologist: Sharyn Lull  Electrophysiologist: Graciela Husbands Nephrologist: [facility] 08/25/2022 Office Weight: 303 lbs  12/10/2022 Office Weight: 313 lbs                                                            Spoke with wife and heart failure questions reviewed.  Transmission results reviewed.  Pt asymptomatic for fluid accumulation.  Reports feeling well at this time and voices no complaints.     Optivol thoracic impedance suggesting normal fluid levels within the last month.    Prescribed:  Furosemide 40 mg take 1 - 2 tablet(s) (40 mg total) as needed for fluid retention ( prescribed by Dr Jennette Kettle 7/24)   Labs: 10/29/2022 Creatinine 1.65, BUN 21, Potassium 4.2, Sodium 144, GFR 46 04/23/2022 Creatinine 1.44, BUN 12, Potassium 4.7, Sodium 146, GFR 55  A complete set of results can be found in Results Review.   Recommendations: No changes and encouraged to call if experiencing any fluid symptoms.  Pt has referral to [facility]   Follow-up plan: ICM clinic phone appointment on 01/19/2023.   91 day device clinic remote transmission 02/27/2023.     EP/Cardiology Office Visits:  Recall 08/20/2023 with Dr. Graciela Husbands.     Copy of ICM check sent to Dr. Graciela Husbands.   3 month ICM trend: 12/15/2022.    12-14 Month ICM trend:     Karie Soda, RN 12/19/2022 10:19 AM

## 2022-12-23 ENCOUNTER — Other Ambulatory Visit: Payer: Self-pay

## 2022-12-23 DIAGNOSIS — M170 Bilateral primary osteoarthritis of knee: Secondary | ICD-10-CM

## 2022-12-23 DIAGNOSIS — G8929 Other chronic pain: Secondary | ICD-10-CM

## 2022-12-23 MED ORDER — HYDROCODONE-ACETAMINOPHEN 5-325 MG PO TABS
ORAL_TABLET | ORAL | 0 refills | Status: DC
Start: 2022-12-23 — End: 2022-12-25

## 2022-12-25 NOTE — Telephone Encounter (Signed)
Patient's wife calls nurse line regarding status of pain medication.   Per chart review, medication was approved on 9/17, however, transmission to pharmacy failed.   Pended medication refill to this encounter.   Will forward to PCP to resend.   Veronda Prude, RN

## 2022-12-25 NOTE — Addendum Note (Signed)
Addended by: Veronda Prude on: 12/25/2022 11:58 AM   Modules accepted: Orders

## 2022-12-26 MED ORDER — HYDROCODONE-ACETAMINOPHEN 5-325 MG PO TABS
ORAL_TABLET | ORAL | 0 refills | Status: DC
Start: 2022-12-26 — End: 2023-01-22

## 2022-12-26 NOTE — Telephone Encounter (Signed)
Called wife, LVM advising that Dr. Jennette Kettle had resent medication.   Veronda Prude, RN

## 2022-12-26 NOTE — Telephone Encounter (Signed)
Patient wife calling again regarding this refill. Please advise.

## 2023-01-02 ENCOUNTER — Telehealth: Payer: Self-pay

## 2023-01-02 NOTE — Telephone Encounter (Signed)
Received 2 Vms from patient's wife regarding request for Rollator walker with transport chair.   Attempted to return call to [facility]. She did not answer, LVM asking her to return call to office.   Patient will need an office visit to discuss need for medical equipment per insurance guidelines.   Veronda Prude, RN

## 2023-01-09 ENCOUNTER — Other Ambulatory Visit: Payer: Self-pay | Admitting: Family Medicine

## 2023-01-19 ENCOUNTER — Ambulatory Visit: Payer: Medicare Other | Attending: Internal Medicine

## 2023-01-19 DIAGNOSIS — I5022 Chronic systolic (congestive) heart failure: Secondary | ICD-10-CM

## 2023-01-19 DIAGNOSIS — Z95810 Presence of automatic (implantable) cardiac defibrillator: Secondary | ICD-10-CM | POA: Diagnosis not present

## 2023-01-21 ENCOUNTER — Ambulatory Visit: Payer: Medicare Other | Admitting: Family Medicine

## 2023-01-21 ENCOUNTER — Encounter: Payer: Self-pay | Admitting: Family Medicine

## 2023-01-21 VITALS — BP 152/102 | HR 73 | Ht 62.0 in | Wt 321.8 lb

## 2023-01-21 DIAGNOSIS — H543 Unqualified visual loss, both eyes: Secondary | ICD-10-CM

## 2023-01-21 DIAGNOSIS — Z59819 Housing instability, housed unspecified: Secondary | ICD-10-CM

## 2023-01-21 DIAGNOSIS — N3942 Incontinence without sensory awareness: Secondary | ICD-10-CM | POA: Diagnosis not present

## 2023-01-21 NOTE — Progress Notes (Signed)
    CHIEF COMPLAINT / HPI: Discussion regarding urinary incontinence.  Has been seen by urology with full workup.  Continues to need incontinence supplies due to urinary urgency, #2.  Housing issues: His son is now living with him full-time which is very helpful as his son has become his primary caretaker.  He is concerned about upcoming winter as his house/apartment last year was very cold.  Only has baseboard heaters.  States he has had difficulty applying for housing in the past because they want him to bring in new paperwork every 4 months.  Given his physical disabilities, that is blindness and he has transportation issues, that has been an overwhelming barrier for him.   PERTINENT  PMH / PSH: I have reviewed the patient's medications, allergies, past medical and surgical history, smoking status and updated in the EMR as appropriate.   OBJECTIVE:  BP (!) 152/102   Pulse 73   Ht 5\' 2"  (1.575 m)   Wt (!) 321 lb 12.8 oz (146 kg)   SpO2 96%   BMI 58.86 kg/m    ASSESSMENT / PLAN: #2.  Housing instability: Does not sound like he has adequate heat in his home for upcoming winter.  Now that his son is living with him, I think he will have some assistance in filling out paperwork etc.  Will make referral to social work to see if they can assist him.  Urinary incontinence without sensory awareness Likely related to his diabetes.  Will need lifelong incontinence supplies.  Will fill out paperwork for him.  Copy of the chart.   Denny Levy MD

## 2023-01-22 ENCOUNTER — Other Ambulatory Visit: Payer: Self-pay

## 2023-01-22 ENCOUNTER — Telehealth: Payer: Self-pay | Admitting: *Deleted

## 2023-01-22 DIAGNOSIS — G8929 Other chronic pain: Secondary | ICD-10-CM

## 2023-01-22 DIAGNOSIS — M170 Bilateral primary osteoarthritis of knee: Secondary | ICD-10-CM

## 2023-01-22 MED ORDER — HYDROCODONE-ACETAMINOPHEN 5-325 MG PO TABS
ORAL_TABLET | ORAL | 0 refills | Status: DC
Start: 2023-01-22 — End: 2023-02-19

## 2023-01-22 NOTE — Progress Notes (Signed)
Care Coordination   Note   01/22/2023 Name: [name] MRN: [id number] DOB: 1961/01/14  [name] is a 62 y.o. year old male who sees Nestor Ramp, MD for primary care. I reached out to [name] by phone today to offer care coordination services.  [name] was given information about Care Coordination services today including:   The Care Coordination services include support from the care team which includes your Nurse Coordinator, Clinical Social Worker, or Pharmacist.  The Care Coordination team is here to help remove barriers to the health concerns and goals most important to you. Care Coordination services are voluntary, and the patient may decline or stop services at any time by request to their care team member.   Care Coordination Consent Status: Patient agreed to services and verbal consent obtained.   Follow up plan:  Telephone appointment with care coordination team member scheduled for:  01/29/23  Encounter Outcome:  Patient Scheduled  [facility] Coordination Care Guide  Direct Dial: [phone]

## 2023-01-22 NOTE — Telephone Encounter (Signed)
[name] calls nurse line requesting a refill on patients Norco prescription.   Last fill date 9/20.  Will forward to PCP.

## 2023-01-23 DIAGNOSIS — N3942 Incontinence without sensory awareness: Secondary | ICD-10-CM | POA: Insufficient documentation

## 2023-01-23 NOTE — Assessment & Plan Note (Signed)
Likely related to his diabetes.  Will need lifelong incontinence supplies.  Will fill out paperwork for him.  Copy of the chart.

## 2023-01-23 NOTE — Progress Notes (Signed)
EPIC Encounter for ICM Monitoring  Patient Name: [name] is a 62 y.o. male Date: 01/23/2023 Primary Care Physican: Nestor Ramp, MD Primary Cardiologist: Sharyn Lull  Electrophysiologist: Graciela Husbands Nephrologist: [facility] 08/25/2022 Office Weight: 303 lbs  12/10/2022 Office Weight: 313 lbs                                                            Spoke with patient and heart failure questions reviewed.  Transmission results reviewed.  Pt asymptomatic for fluid accumulation.  Reports feeling well at this time and voices no complaints.     Optivol thoracic impedance suggesting normal fluid levels within the last month. 9/16-9/25 and 9/30-10/5   Prescribed:  Furosemide 40 mg take 1 tablet(s) (40 mg total) by mouth as directed ( prescribed by Dr Jennette Kettle 7/24)   Labs: 12/10/2022 Creatinine 1.66, BUN 21, Potassium 4.0, Sodium 143, GFR 46 10/29/2022 Creatinine 1.65, BUN 21, Potassium 4.2, Sodium 144, GFR 46 04/23/2022 Creatinine 1.44, BUN 12, Potassium 4.7, Sodium 146, GFR 55  A complete set of results can be found in Results Review.   Recommendations: No changes and encouraged to call if experiencing any fluid symptoms.     Follow-up plan: ICM clinic phone appointment on 02/23/2023.   91 day device clinic remote transmission 02/27/2023.     EP/Cardiology Office Visits:  Recall 08/20/2023 with Dr. Graciela Husbands.     Copy of ICM check sent to Dr. Graciela Husbands.   3 month ICM trend: 01/19/2023.    12-14 Month ICM trend:     Karie Soda, RN 01/23/2023 3:10 PM

## 2023-01-29 ENCOUNTER — Ambulatory Visit: Payer: Self-pay | Admitting: Licensed Clinical Social Worker

## 2023-01-29 NOTE — Patient Instructions (Signed)
Visit Information  Thank you for taking time to visit with me today. Please don't hesitate to contact me if I can be of assistance to you.   Following are the goals we discussed today:   Goals Addressed             This Visit's Progress    Care Coordination Activities       Care Coordination Interventions: The patient wants to move to a 2 bedroom due to issues in the home that the landlord has not fixed and also due to his son living with him now. The patient may complete another housing application with [facility] The Sw spoke with the ex wife and it was state that the new Investment banker, corporate completed a walk through on last week and that the issues will be corrected. The Sw will follow up with the patient in about a week.         Our next appointment is by telephone on 02/12/2023 at 1:30pm  Please call the care guide team at [phone] if you need to cancel or reschedule your appointment.   If you are experiencing a Mental Health or Behavioral Health Crisis or need someone to talk to, please go to [facility] [address] [place] [phone]) call 911  The patient verbalized understanding of instructions, educational materials, and care plan provided today and DECLINED offer to receive copy of patient instructions, educational materials, and care plan.   Jeanie Cooks, PhD [facility], [facility] Social Worker Direct Dial: [phone]  Fax: [phone]

## 2023-01-29 NOTE — Patient Outreach (Signed)
Care Coordination   Initial Visit Note   01/29/2023 Name: [name] MRN: [id number] DOB: 27-Oct-1960  [name] is a 62 y.o. year old male who sees Nestor Ramp, MD for primary care. I spoke with  [name] by phone today.  What matters to the patients health and wellness today?  Housing     Goals Addressed             This Visit's Progress    Care Coordination Activities       Care Coordination Interventions: The patient wants to move to a 2 bedroom due to issues in the home that the landlord has not fixed and also due to his son living with him now. The patient may complete another housing application with [facility]. The Sw spoke with the ex wife and it was state that the new Investment banker, corporate completed a walk through on last week and that the issues will be corrected. The Sw will follow up with the patient in about a week.         SDOH assessments and interventions completed:  Yes  SDOH Interventions Today    Flowsheet Row Most Recent Value  SDOH Interventions   Food Insecurity Interventions Intervention Not Indicated  Housing Interventions --  [The heat is not working as hard as it neeeds to, Sw spoke with the ex wife and she stated that the property mangement will be addressing the issues in the apartment.]  Transportation Interventions Intervention Not Indicated  Utilities Interventions Intervention Not Indicated        Care Coordination Interventions:  Yes, provided   Follow up plan: Follow up call scheduled for 02/12/2023 at 1:30pm    Encounter Outcome:  Patient Visit Completed   Jeanie Cooks, PhD [facility], [facility] Social Worker Direct Dial: [phone]  Fax: [phone]

## 2023-01-30 ENCOUNTER — Telehealth: Payer: Self-pay

## 2023-01-30 DIAGNOSIS — R269 Unspecified abnormalities of gait and mobility: Secondary | ICD-10-CM

## 2023-01-30 DIAGNOSIS — H543 Unqualified visual loss, both eyes: Secondary | ICD-10-CM

## 2023-01-30 DIAGNOSIS — I5022 Chronic systolic (congestive) heart failure: Secondary | ICD-10-CM

## 2023-01-30 NOTE — Telephone Encounter (Signed)
Patient's wife, [name], calls nurse line regarding request for medical equipment.   They are requesting rollating/transforming walker.   Patient was seen on 01/21/23. Wife reports that this was addressed at this visit.   I am not seeing an order for this DME. OV note will also need to include the need for this device.   Will forward to PCP for further advisement.   Veronda Prude, RN

## 2023-02-03 NOTE — Telephone Encounter (Signed)
[name] calls nurse line requesting an update on DME order.   Advised will let her know once the order is placed by PCP.

## 2023-02-03 NOTE — Addendum Note (Signed)
Addended byDenny Levy L on: 02/03/2023 04:18 PM   Modules accepted: Orders

## 2023-02-10 NOTE — Telephone Encounter (Signed)
Community message sent to [employer] for processing.

## 2023-02-12 ENCOUNTER — Telehealth: Payer: Self-pay | Admitting: Family Medicine

## 2023-02-12 ENCOUNTER — Ambulatory Visit: Payer: Self-pay | Admitting: Licensed Clinical Social Worker

## 2023-02-12 NOTE — Telephone Encounter (Signed)
Opened in error

## 2023-02-12 NOTE — Telephone Encounter (Signed)
Patients wife is calling very emotional and upset concerning this order. [employer] called her yesterday explaining that the order was still incorrect because there was no documentation stating why he needs specific walker. She is also stating she believes it is the wrong one.   She only wants to speak with Dr. Jennette Kettle concerning this matter. Please return call to wife.

## 2023-02-12 NOTE — Patient Outreach (Signed)
  Care Coordination   Follow Up Visit Note   02/12/2023 Name: [name] MRN: [id number] DOB: 1960/09/07  [name] is a 62 y.o. year old male who sees Nestor Ramp, MD for primary care. I spoke with  [name] by phone today.  What matters to the patients health and wellness today?  [employer] complaints, Patient stated that the [facility] has been working on his apartment and has fixed alot of thing, he will be getting a new refrigerator and a new screen for his front door. Patient is going to stay in his apartment.     Goals Addressed             This Visit's Progress    COMPLETED: Care Coordination Activities       Care Coordination Interventions: The patient wants to move to a 2 bedroom due to issues in the home that the landlord has not fixed and also due to his son living with him now. The patient may complete another housing application with [facility]. The Sw spoke with the ex wife and it was state that the new Investment banker, corporate completed a walk through on last week and that the issues will be corrected. The Sw will follow up with the patient in about a week.  Patient stated that the [facility] has been working on his apartment and has fixed alot of thing, he will be getting a new refrigerator and a new screen for his front door.         SDOH assessments and interventions completed:  Yes     Care Coordination Interventions:  Yes, provided  Interventions Today    Flowsheet Row Most Recent Value  General Interventions   General Interventions Discussed/Reviewed General Interventions Discussed, General Interventions Reviewed, [employer] stated that the [facility] has been working on his apartment and has fixed  alot of thing, he will be getting a new refrigerator and a new screen for his front door.]        Follow up plan: No further intervention required.   Encounter Outcome:  Patient Visit Completed   Jeanie Cooks, PhD [facility], [facility] Social Worker Direct Dial: [phone]  Fax: [phone]

## 2023-02-12 NOTE — Telephone Encounter (Signed)
Patient's wife returns call to nurse line regarding concern.   She states that she was told that order needs to be changed to "transforming/duet walker."  Also, there needs to be specific documentation regarding the need for this equipment.   Dr. Jennette Kettle- are you able to edit the note from 01/21/23 or does patient need to be scheduled for a virtual visit in order to get this documentation.   Please advise.   Veronda Prude, RN

## 2023-02-12 NOTE — Telephone Encounter (Signed)
I can addend the note but I am curious who told her that. Was it the supplier? Let me know and I can append note but I will have to out in a new order. THANKS! Denny Levy

## 2023-02-12 NOTE — Patient Instructions (Signed)
Visit Information  Thank you for taking time to visit with me today. Please don't hesitate to contact me if I can be of assistance to you.   Following are the goals we discussed today:   Goals Addressed             This Visit's Progress    COMPLETED: Care Coordination Activities       Care Coordination Interventions: The patient wants to move to a 2 bedroom due to issues in the home that the landlord has not fixed and also due to his son living with him now. The patient may complete another housing application with [facility]. The Sw spoke with the ex wife and it was state that the new Investment banker, corporate completed a walk through on last week and that the issues will be corrected. The Sw will follow up with the patient in about a week.  Patient stated that the [facility] has been working on his apartment and has fixed alot of thing, he will be getting a new refrigerator and a new screen for his front door.         Goal Completed no further follow-up   Please call the care guide team at [phone] if you need to cancel or reschedule your appointment.   If you are experiencing a Mental Health or Behavioral Health Crisis or need someone to talk to, please call the Suicide and Crisis Lifeline: 988 go to [facility] [address] [place] [phone]) call 911  The patient verbalized understanding of instructions, educational materials, and care plan provided today and DECLINED offer to receive copy of patient instructions, educational materials, and care plan.   Jeanie Cooks, PhD [facility], [facility] Social Worker Direct Dial: [phone]  Fax: [phone]

## 2023-02-19 ENCOUNTER — Other Ambulatory Visit: Payer: Self-pay | Admitting: Family Medicine

## 2023-02-19 DIAGNOSIS — M170 Bilateral primary osteoarthritis of knee: Secondary | ICD-10-CM

## 2023-02-19 DIAGNOSIS — G8929 Other chronic pain: Secondary | ICD-10-CM

## 2023-02-23 ENCOUNTER — Telehealth: Payer: Self-pay

## 2023-02-23 ENCOUNTER — Ambulatory Visit: Payer: Medicare Other | Attending: Internal Medicine

## 2023-02-23 DIAGNOSIS — Z95810 Presence of automatic (implantable) cardiac defibrillator: Secondary | ICD-10-CM

## 2023-02-23 DIAGNOSIS — I5022 Chronic systolic (congestive) heart failure: Secondary | ICD-10-CM | POA: Diagnosis not present

## 2023-02-23 NOTE — Progress Notes (Signed)
EPIC Encounter for ICM Monitoring  Patient Name: [name] is a 62 y.o. male Date: 02/23/2023 Primary Care Physican: Nestor Ramp, MD Primary Cardiologist: Sharyn Lull  Electrophysiologist: Graciela Husbands Nephrologist: [facility] 08/25/2022 Office Weight: 303 lbs  12/10/2022 Office Weight: 313 lbs                                                            Attempted call to wife and unable to reach.  Left message to return call. Transmission reviewed.     Optivol thoracic impedance suggesting possible fluid accumulation starting 11/11.   Prescribed:  Furosemide 40 mg take 1 tablet(s) (40 mg total) by mouth as directed ( prescribed by Dr Jennette Kettle 7/24)   Labs: 12/10/2022 Creatinine 1.66, BUN 21, Potassium 4.0, Sodium 143, GFR 46 10/29/2022 Creatinine 1.65, BUN 21, Potassium 4.2, Sodium 144, GFR 46 04/23/2022 Creatinine 1.44, BUN 12, Potassium 4.7, Sodium 146, GFR 55  A complete set of results can be found in Results Review.   Recommendations:   Unable to reach.     Follow-up plan: ICM clinic phone appointment on 03/02/2023 to recheck fluid levels.   91 day device clinic remote transmission 02/27/2023.     EP/Cardiology Office Visits:  Recall 08/20/2023 with Dr. Graciela Husbands.     Copy of ICM check sent to Dr. Graciela Husbands.   3 month ICM trend: 02/23/2023.    12-14 Month ICM trend:     Karie Soda, RN 02/23/2023 11:13 AM

## 2023-02-23 NOTE — Telephone Encounter (Signed)
Remote ICM transmission received.  Attempted call to wife, per DPR regarding ICM remote transmission and left detailed message per DPR with ICM phone number to return call.

## 2023-02-25 ENCOUNTER — Ambulatory Visit: Payer: Medicare Other | Admitting: Family Medicine

## 2023-02-27 ENCOUNTER — Ambulatory Visit: Payer: Medicare Other

## 2023-02-27 ENCOUNTER — Other Ambulatory Visit: Payer: Self-pay

## 2023-02-27 DIAGNOSIS — I428 Other cardiomyopathies: Secondary | ICD-10-CM | POA: Diagnosis not present

## 2023-02-27 LAB — CUP PACEART REMOTE DEVICE CHECK
Battery Remaining Longevity: 44 mo
Battery Voltage: 2.97 V
Brady Statistic RV Percent Paced: 0.01 %
Date Time Interrogation Session: 20241122044223
HighPow Impedance: 42 Ohm
HighPow Impedance: 51 Ohm
Implantable Lead Connection Status: 753985
Implantable Lead Implant Date: 20081114
Implantable Lead Location: 753860
Implantable Lead Model: 6947
Implantable Pulse Generator Implant Date: 20170222
Lead Channel Impedance Value: 323 Ohm
Lead Channel Impedance Value: 399 Ohm
Lead Channel Pacing Threshold Amplitude: 0.625 V
Lead Channel Pacing Threshold Pulse Width: 0.4 ms
Lead Channel Sensing Intrinsic Amplitude: 6.625 mV
Lead Channel Sensing Intrinsic Amplitude: 6.625 mV
Lead Channel Setting Pacing Amplitude: 2.5 V
Lead Channel Setting Pacing Pulse Width: 0.4 ms
Lead Channel Setting Sensing Sensitivity: 0.3 mV
Zone Setting Status: 755011

## 2023-03-01 MED ORDER — INSULIN GLARGINE-YFGN 100 UNIT/ML ~~LOC~~ SOPN: 25.0000 [IU] | PEN_INJECTOR | Freq: Every day | SUBCUTANEOUS | 11 refills | Status: DC

## 2023-03-02 ENCOUNTER — Ambulatory Visit: Payer: Medicare Other | Attending: Internal Medicine

## 2023-03-02 DIAGNOSIS — I5022 Chronic systolic (congestive) heart failure: Secondary | ICD-10-CM

## 2023-03-02 DIAGNOSIS — Z95810 Presence of automatic (implantable) cardiac defibrillator: Secondary | ICD-10-CM

## 2023-03-03 DIAGNOSIS — H401133 Primary open-angle glaucoma, bilateral, severe stage: Secondary | ICD-10-CM | POA: Diagnosis not present

## 2023-03-03 NOTE — Progress Notes (Signed)
EPIC Encounter for ICM Monitoring  Patient Name: [name] is a 62 y.o. male Date: 03/03/2023 Primary Care Physican: Nestor Ramp, MD Primary Cardiologist: Sharyn Lull  Electrophysiologist: Graciela Husbands Nephrologist: [facility] 08/25/2022 Office Weight: 303 lbs  12/10/2022 Office Weight: 313 lbs                                                            Spoke with wife and heart failure questions reviewed.  Transmission results reviewed.  Pt asymptomatic for fluid accumulation.  Reports feeling well at this time and voices no complaints.  Patient is having terrible eye pain and will be having it removed.    Optivol thoracic impedance suggesting fluid levels returned to normal.   Prescribed:  Furosemide 40 mg take 1 tablet(s) (40 mg total) by mouth as directed ( prescribed by Dr Jennette Kettle 7/24)   Labs: 12/10/2022 Creatinine 1.66, BUN 21, Potassium 4.0, Sodium 143, GFR 46 10/29/2022 Creatinine 1.65, BUN 21, Potassium 4.2, Sodium 144, GFR 46 04/23/2022 Creatinine 1.44, BUN 12, Potassium 4.7, Sodium 146, GFR 55  A complete set of results can be found in Results Review.   Recommendations:   No changes and encouraged to call if experiencing any fluid symptoms.   Follow-up plan: ICM clinic phone appointment on 03/30/2023.   91 day device clinic remote transmission 05/29/2023.     EP/Cardiology Office Visits:  Recall 08/20/2023 with Dr. Graciela Husbands.     Copy of ICM check sent to Dr. Graciela Husbands.   3 month ICM trend: 03/02/2023.    12-14 Month ICM trend:     Karie Soda, RN 03/03/2023 11:12 AM

## 2023-03-12 ENCOUNTER — Telehealth: Payer: Self-pay

## 2023-03-12 NOTE — Telephone Encounter (Signed)
[name] calls nurse line requesting an antibiotic.   She reports she has been wrapping his legs to protect blisters. She reports he has blisters that bust open and stick to his pants. She reports PCP told them to wrap his legs with ace bandages. She reports they have been using those and white guaze purchased at [facility].   She reports they pulled off the bandage ~ 3 weeks ago and "tore" his skin. She reports the wound is not healing. She reports clear drainage coming from the wound, she denies any odors. She denies any fevers. She reports they have been using neosporin on the wound. The wound has not worsened nor improved.   Apt offered for this week, however he is unable to come in due to transportation.   Patient scheduled for 12/10 with Mabe.   Precautions discussed for earlier evaluation.

## 2023-03-16 DIAGNOSIS — N1832 Chronic kidney disease, stage 3b: Secondary | ICD-10-CM | POA: Diagnosis not present

## 2023-03-16 DIAGNOSIS — I129 Hypertensive chronic kidney disease with stage 1 through stage 4 chronic kidney disease, or unspecified chronic kidney disease: Secondary | ICD-10-CM | POA: Diagnosis not present

## 2023-03-16 DIAGNOSIS — R809 Proteinuria, unspecified: Secondary | ICD-10-CM | POA: Diagnosis not present

## 2023-03-16 DIAGNOSIS — M1A9XX0 Chronic gout, unspecified, without tophus (tophi): Secondary | ICD-10-CM | POA: Diagnosis not present

## 2023-03-16 DIAGNOSIS — I5022 Chronic systolic (congestive) heart failure: Secondary | ICD-10-CM | POA: Diagnosis not present

## 2023-03-16 DIAGNOSIS — E1122 Type 2 diabetes mellitus with diabetic chronic kidney disease: Secondary | ICD-10-CM | POA: Diagnosis not present

## 2023-03-16 DIAGNOSIS — E1129 Type 2 diabetes mellitus with other diabetic kidney complication: Secondary | ICD-10-CM | POA: Diagnosis not present

## 2023-03-17 ENCOUNTER — Telehealth (INDEPENDENT_AMBULATORY_CARE_PROVIDER_SITE_OTHER): Payer: Medicare Other | Admitting: Family Medicine

## 2023-03-17 ENCOUNTER — Other Ambulatory Visit: Payer: Self-pay

## 2023-03-17 ENCOUNTER — Encounter: Payer: Self-pay | Admitting: Family Medicine

## 2023-03-17 ENCOUNTER — Other Ambulatory Visit: Payer: Self-pay | Admitting: Family Medicine

## 2023-03-17 ENCOUNTER — Ambulatory Visit: Payer: Medicare Other | Admitting: Family Medicine

## 2023-03-17 VITALS — BP 143/98 | HR 81 | Wt 319.2 lb

## 2023-03-17 DIAGNOSIS — L97221 Non-pressure chronic ulcer of left calf limited to breakdown of skin: Secondary | ICD-10-CM | POA: Diagnosis not present

## 2023-03-17 DIAGNOSIS — I872 Venous insufficiency (chronic) (peripheral): Secondary | ICD-10-CM | POA: Diagnosis not present

## 2023-03-17 DIAGNOSIS — L97909 Non-pressure chronic ulcer of unspecified part of unspecified lower leg with unspecified severity: Secondary | ICD-10-CM | POA: Insufficient documentation

## 2023-03-17 DIAGNOSIS — G8929 Other chronic pain: Secondary | ICD-10-CM

## 2023-03-17 DIAGNOSIS — M170 Bilateral primary osteoarthritis of knee: Secondary | ICD-10-CM

## 2023-03-17 DIAGNOSIS — M109 Gout, unspecified: Secondary | ICD-10-CM

## 2023-03-17 DIAGNOSIS — M79672 Pain in left foot: Secondary | ICD-10-CM

## 2023-03-17 NOTE — Patient Instructions (Signed)
Keep the hydrocolloid dressing on the area for at least a week.  If it falls off, you can use Vaseline in the bandage.  Keep the area clean and moist.  I have sent in an order for an ultrasound of your arteries.  The team will call to get this scheduled for you.  Do not use compression stockings until we get these results back.  Try to elevate the legs is much as possible.  We will see you back in 1 week or sooner if symptoms worsen.

## 2023-03-17 NOTE — Progress Notes (Signed)
    SUBJECTIVE:   CHIEF COMPLAINT / HPI:   Wound on foot Has history of CHF with lower extremity edema for which he uses compression stockings.  Had seen Dr. Jennette Kettle for this and was also given Lasix back in July 2024.  They have also been wrapping his legs with Ace bandages to protect blisters that busted open and stick to his pants. About a month ago, they pulled the bandage off which "tore his skin."  No drainage, odor, fevers. Has been using neosporin on the wound without change. Some pain with stretching the leg but otherwise none.  Would like to have this further evaluated.  PERTINENT  PMH / PSH: PAF on chronic anticoagulation, CHF, OSA, T2DM, Charles Bonnet syndrome, blind in both eyes  OBJECTIVE:   BP (!) 143/98   Pulse 81   Wt (!) 319 lb 3.2 oz (144.8 kg)   SpO2 97%   BMI 58.38 kg/m   General: Alert and oriented, in NAD Skin: Warm, dry; small ulcerated lesion with serous drainage and mild surrounding erythema located on left lower lateral leg HEENT: NCAT, midline nasal septum Cardiac: Regular rate Respiratory: Breathing and speaking comfortably on RA Psychiatric: Appropriate mood and affect     ASSESSMENT/PLAN:   Venous stasis ulcer (HCC) Reassuringly does not appear infected.  No systemic signs of illness.  Applied hydrocolloid dressing today in office to help promote moist environment for wound healing.  Would benefit from compression stockings given difficult for patient to keep legs elevated; however, dorsalis pedis and posterior tibialis pulses difficult to palpate due to pitting edema.  Will order ABIs to further assess before use of compression. Will follow-up in 1 week to assess healing and change hydrocolloid dressing as needed.   Health maintenance Consider vaccines at next visit.  Janeal Holmes, MD [facility]

## 2023-03-17 NOTE — Assessment & Plan Note (Signed)
Reassuringly does not appear infected.  No systemic signs of illness.  Applied hydrocolloid dressing today in office to help promote moist environment for wound healing.  Would benefit from compression stockings given difficult for patient to keep legs elevated; however, dorsalis pedis and posterior tibialis pulses difficult to palpate due to pitting edema.  Will order ABIs to further assess before use of compression. Will follow-up in 1 week to assess healing and change hydrocolloid dressing as needed.

## 2023-03-17 NOTE — Progress Notes (Signed)
error 

## 2023-03-18 DIAGNOSIS — N1832 Chronic kidney disease, stage 3b: Secondary | ICD-10-CM | POA: Diagnosis not present

## 2023-03-18 MED ORDER — HYDROCODONE-ACETAMINOPHEN 5-325 MG PO TABS: ORAL_TABLET | ORAL | 0 refills | Status: DC

## 2023-03-19 NOTE — Progress Notes (Signed)
Remote ICD transmission.   

## 2023-03-20 ENCOUNTER — Other Ambulatory Visit: Payer: Self-pay

## 2023-03-20 MED ORDER — SURE COMFORT PEN NEEDLES 32G X 6 MM MISC: 1 refills | Status: DC

## 2023-03-23 ENCOUNTER — Other Ambulatory Visit: Payer: Self-pay | Admitting: Family Medicine

## 2023-03-23 DIAGNOSIS — S39012A Strain of muscle, fascia and tendon of lower back, initial encounter: Secondary | ICD-10-CM

## 2023-03-24 ENCOUNTER — Ambulatory Visit: Payer: Self-pay | Admitting: Student

## 2023-03-24 NOTE — Progress Notes (Deleted)
  SUBJECTIVE:   CHIEF COMPLAINT / HPI:   Venous stasis ulcer: Last seen by Dr. Phineas Dalton on 12/10 in which hydrocolloid dressing was applied.  ABIs ordered although not yet scheduled.  PERTINENT  PMH / PSH: PAF on chronic anticoagulation, CHF, OSA, T2DM, [name] syndrome, blind in both eyes   OBJECTIVE:  There were no vitals taken for this visit. Physical Exam   ASSESSMENT/PLAN:   Assessment & Plan  No follow-ups on file. Adam Mattocks, DO 03/24/2023, 7:48 AM PGY-3, [facility] {    This will disappear when note is signed, click to select method of visit    :1}

## 2023-03-30 ENCOUNTER — Ambulatory Visit (INDEPENDENT_AMBULATORY_CARE_PROVIDER_SITE_OTHER): Payer: Medicare Other | Admitting: Physician Assistant

## 2023-03-30 ENCOUNTER — Ambulatory Visit: Payer: Medicare Other | Attending: Internal Medicine

## 2023-03-30 ENCOUNTER — Ambulatory Visit (HOSPITAL_COMMUNITY)
Admission: RE | Admit: 2023-03-30 | Discharge: 2023-03-30 | Disposition: A | Discharge status: Home / Self Care | Payer: Medicare Other | Source: Ambulatory Visit | Attending: Surgery | Admitting: Surgery

## 2023-03-30 ENCOUNTER — Encounter: Payer: Self-pay | Admitting: Physician Assistant

## 2023-03-30 ENCOUNTER — Other Ambulatory Visit: Payer: Self-pay | Admitting: *Deleted

## 2023-03-30 DIAGNOSIS — Z95810 Presence of automatic (implantable) cardiac defibrillator: Secondary | ICD-10-CM | POA: Diagnosis not present

## 2023-03-30 DIAGNOSIS — I5022 Chronic systolic (congestive) heart failure: Secondary | ICD-10-CM

## 2023-03-30 DIAGNOSIS — I83008 Varicose veins of unspecified lower extremity with ulcer other part of lower leg: Secondary | ICD-10-CM | POA: Diagnosis not present

## 2023-03-30 DIAGNOSIS — L97221 Non-pressure chronic ulcer of left calf limited to breakdown of skin: Secondary | ICD-10-CM | POA: Diagnosis not present

## 2023-03-30 DIAGNOSIS — I83028 Varicose veins of left lower extremity with ulcer other part of lower leg: Secondary | ICD-10-CM | POA: Diagnosis not present

## 2023-03-30 DIAGNOSIS — L97801 Non-pressure chronic ulcer of other part of unspecified lower leg limited to breakdown of skin: Secondary | ICD-10-CM | POA: Insufficient documentation

## 2023-03-30 DIAGNOSIS — I872 Venous insufficiency (chronic) (peripheral): Secondary | ICD-10-CM

## 2023-03-30 NOTE — Progress Notes (Signed)
[facility]           OF [place]  History and Physical   [name] is a 62 y.o. male who presents with BLE swelling.  He has hx of CHF with lower extremity edema and an ulceration.  He states that he was sleeping in the recliner with his legs in the dependent position and they would continue to swell.  He states his son encouraged him to sleep in the bed and he states the swelling was better in the mornings after being in bed.  He has never had any vein procedures on his legs.  He has hx of right knee surgery and has arthritis in his right knee, which holds him up from walking regularly.  He is blind from glaucoma, which runs in his family.  He does have skin changes in his legs. He does not have any rest pain or non healing wounds on his feet.  He has hx of DM.  He does not know of any family hx of leg swelling.     He has hx of AICD, blindness, CHF, PAF, TIA, NICM, CKD, DM.  The pt is on a statin for cholesterol management.  The pt is not on a daily aspirin.   Other AC:  Eliquis The pt is on diuretic  The pt is  on medication for diabetes.   Tobacco hx:  never  Pt does not have family hx of AAA.  Past Medical History:  Diagnosis Date   Arthritis    Arthritis of knee, degenerative 10/03/2011   Right > LEFT KNEE oa X-rays were not standing but showed some mild sclerosis and joint space loss of the medial compartment of the right knee. History of arthroscopy right knee. Report of injury as a child but unclear exactly what that was.  :I am aware of 12/01/18 Rx by Dr. Jennette Kettle.  While wife was hospitalized, narcotic Rx went missing when in-home sitter left.  To my knowledge, this is the first mi   Automatic implantable cardioverter-defibrillator in situ 07/15/2013   Blind in both eyes 06/15/2013   Right Sees Dr Dione Booze   Cataracts, bilateral    right   Chronic gout due to renal impairment of multiple sites without tophus 04/12/2020   Chronic systolic heart  failure (HCC) 86/57/8469   Qualifier: Diagnosis of  By: Bryson Ha MD, Gwendalyn Ege    Congestive heart failure (HCC)    a. s/p MDT single chamber ICD    Diabetes mellitus    does not check blood sugars at home   Diverticulosis    Encounter for chronic pain management 05/17/2014   Indication for chronic opioid: severe arthritis RIGHTknee / low back Medication and dose: hydrocodone---uses prn # pills per month: gets #120 pills to last TWO months Last UDS date: 05/17/2014 Pain contract signed (Y/N): Y 05/17/2014 Date narcotic database last reviewed (include red flags): 05/17/2014    Glaucoma    bilateral   Glaucoma 11/07/2015   Severe See Dr. Dione Booze   H/O TIA (transient ischemic attack) and stroke    Hyperlipidemia    Hyperlipidemia with target low density lipoprotein (LDL) cholesterol less than 100 mg/dL 10/04/5282   Hypertension    HYPERTENSION, BENIGN 02/18/2007   Qualifier: Diagnosis of  By: Jennette Kettle MD, Ronald Lobo of both feet 04/12/2020   NICM (nonischemic cardiomyopathy) (HCC) 05/30/2015   Obesity    OBESITY, MORBID 05/21/2010  Qualifier: Diagnosis of  By: Graciela Husbands, MD, Susie Cassette    Obstructive sleep apnea 03/27/2009   Qualifier: Diagnosis of  By: Sherral Hammers, RN, BSN, Melanie     Paroxysmal atrial fibrillation (HCC) 09/21/2019   Paroxysmal VT (HCC) 06/22/2019   RENAL DISEASE, CHRONIC, STAGE III 02/18/2007   SHOULDER PAIN, LEFT 08/01/2009   History of left shoulder surgery.     Single implantable cardioverter-defibrillator (ICD) in situ 06/17/2011   Type 2 diabetes mellitus with stage 4 chronic kidney disease, without long-term current use of insulin (HCC) 08/25/2007    Past Surgical History:  Procedure Laterality Date   CARDIAC CATHETERIZATION  10/24/2003   EF of 45-50%   CARDIAC DEFIBRILLATOR PLACEMENT  2008   CATARACT EXTRACTION  2012   right   EP IMPLANTABLE DEVICE N/A 05/30/2015   Procedure: ICD Generator Changeout;  Surgeon: Duke Salvia, MD;  Location: [facility] INVASIVE CV LAB;   Service: Cardiovascular;  Laterality: N/A;   KNEE SURGERY Right 2000   SHOULDER SURGERY  2012   left    Social History   Socioeconomic History   Marital status: Legally Separated    Spouse name: Not on file   Number of children: Not on file   Years of education: Not on file   Highest education level: Not on file  Occupational History   Not on file  Tobacco Use   Smoking status: Never   Smokeless tobacco: Never  Vaping Use   Vaping status: Never Used  Substance and Sexual Activity   Alcohol use: No   Drug use: No   Sexual activity: Not on file  Other Topics Concern   Not on file  Social History Narrative   Not on file   Social Drivers of Health   Financial Resource Strain: Low Risk  (11/24/2022)   Overall Financial Resource Strain (CARDIA)    Difficulty of Paying Living Expenses: Not hard at all  Food Insecurity: No Food Insecurity (01/29/2023)   Hunger Vital Sign    Worried About Running Out of Food in the Last Year: Never true    Ran Out of Food in the Last Year: Never true  Transportation Needs: No Transportation Needs (01/29/2023)   PRAPARE - Administrator, Civil Service (Medical): No    Lack of Transportation (Non-Medical): No  Physical Activity: Inactive (11/24/2022)   Exercise Vital Sign    Days of Exercise per Week: 0 days    Minutes of Exercise per Session: 0 min  Stress: No Stress Concern Present (11/24/2022)   [employer] of Occupational Health - Occupational Stress Questionnaire    Feeling of Stress : Not at all  Social Connections: Socially Isolated (11/24/2022)   Social Connection and Isolation Panel [NHANES]    Frequency of Communication with Friends and Family: More than three times a week    Frequency of Social Gatherings with Friends and Family: Three times a week    Attends Religious Services: Never    Active Member of Clubs or Organizations: No    Attends Banker Meetings: Never    Marital Status: Divorced   Catering manager Violence: Not At Risk (11/24/2022)   Humiliation, Afraid, Rape, and Kick questionnaire    Fear of Current or Ex-Partner: No    Emotionally Abused: No    Physically Abused: No    Sexually Abused: No     Family History  Problem Relation Age of Onset   Colon cancer Father    Esophageal cancer Neg  Hx    Rectal cancer Neg Hx    Stomach cancer Neg Hx     Current Outpatient Medications  Medication Sig Dispense Refill   allopurinol (ZYLOPRIM) 100 MG tablet TAKE 1/2 TABLET BY MOUTH DAILY 45 tablet 3   brimonidine (ALPHAGAN) 0.2 % ophthalmic solution Place 1 drop into both eyes 2 (two) times daily.     carvedilol (COREG) 25 MG tablet Take 1 tablet (25 mg total) by mouth 2 (two) times daily with a meal.     cetirizine (ZYRTEC ALLERGY) 10 MG tablet Take 1 tablet (10 mg total) by mouth daily. 30 tablet 6   cyclobenzaprine (FLEXERIL) 5 MG tablet TAKE ONE TABLET BY MOUTH AT BEDTIME AS NEEDED muscle SPASMS 30 tablet 1   dapagliflozin propanediol (FARXIGA) 10 MG TABS tablet Take 1 tablet (10 mg total) by mouth daily. 90 tablet 3   diclofenac Sodium (VOLTAREN) 1 % GEL Apply 2 g topically 4 (four) times daily as needed (right knee pain). 150 g 2   dorzolamide-timolol (COSOPT) 22.3-6.8 MG/ML ophthalmic solution Place 1 drop into both eyes 2 (two) times daily.     ELIQUIS 5 MG TABS tablet TAKE ONE TABLET BY MOUTH TWICE DAILY 180 tablet 1   EPINEPHRINE 0.3 mg/0.3 mL IJ SOAJ injection Inject 0.3 mg into the muscle as needed for anaphylaxis. 2 each 1   FLOVENT HFA 110 MCG/ACT inhaler Inhale 2 puffs into the lungs 2 (two) times daily. 12 g 0   furosemide (LASIX) 40 MG tablet Take 1 tablet (40 mg total) by mouth as directed. Strength: 40 mg 90 tablet 3   glucose blood (ONETOUCH VERIO) test strip Use as instructed. Use up to four times daily as directed. (FOR ICD-10 E10.9, E11.9). 100 each 12   HYDROcodone-acetaminophen (NORCO/VICODIN) 5-325 MG tablet TAKE ONE TABLET BY MOUTH FOUR TIMES DAILY.  MAY take ONE additional FOR a total of FIVE PER DAY 150 tablet 0   insulin glargine-yfgn (SEMGLEE) 100 UNIT/ML Pen Inject 25 Units into the skin daily. 9 mL 11   Insulin Pen Needle (SURE COMFORT PEN NEEDLES) 32G X 6 MM MISC Use as directed to administer insulin 100 each 1   ipratropium (ATROVENT) 0.03 % nasal spray Place 2 sprays into both nostrils every 12 (twelve) hours. 30 mL 12   isosorbide mononitrate (ISMO) 20 MG tablet TAKE ONE TABLET BY MOUTH TWICE DAILY 180 tablet 3   latanoprost (XALATAN) 0.005 % ophthalmic solution Place 1 drop into both eyes every evening.     naloxone (NARCAN) nasal spray 4 mg/0.1 mL Place 1 sprays (0.4 mg total) into the nose as needed (accidental overdose). 2 each 1   nitroGLYCERIN (NITROSTAT) 0.4 MG SL tablet Place 0.4 mg under the tongue every 5 (five) minutes as needed for chest pain.     QUEtiapine (SEROQUEL) 50 MG tablet TAKE THREE TABLETS BY MOUTH AT BEDTIME 90 tablet 3   rosuvastatin (CRESTOR) 10 MG tablet TAKE ONE TABLET BY MOUTH EVERY DAY 90 tablet 3   sacubitril-valsartan (ENTRESTO) 49-51 MG Take 1 tablet by mouth 2 (two) times daily.     TRUEPLUS INSULIN SYRINGE 31G X 5/16" 0.3 ML MISC      No current facility-administered medications for this visit.    Allergies  Allergen Reactions   Bee Pollen Anaphylaxis, Itching and Swelling   Shellfish Allergy     gout   Nsaids     CKD and CHF    REVIEW OF SYSTEMS:   [X]  denotes positive finding, [ ]   denotes negative finding Cardiac  Comments:  Chest pain or chest pressure:    Shortness of breath upon exertion:    Short of breath when lying flat:    Irregular heart rhythm: x       Vascular    Pain in calf, thigh, or hip brought on by ambulation:    Pain in feet at night that wakes you up from your sleep:     Blood clot in your veins:    Leg swelling:  x       Pulmonary    Oxygen at home:    Productive cough:     Wheezing:         Neurologic    Sudden weakness in arms or legs:     Sudden  numbness in arms or legs:     Sudden onset of difficulty speaking or slurred speech:    Temporary loss of vision in one eye:     Problems with dizziness:         Gastrointestinal    Blood in stool:     Vomited blood:         Genitourinary    Burning when urinating:     Blood in urine:        Psychiatric    Major depression:         Hematologic    Bleeding problems:    Problems with blood clotting too easily:        Skin    Rashes or ulcers:        Constitutional    Fever or chills:      PHYSICAL EXAMINATION:  Today's Vitals   03/30/23 1351  BP: (!) 149/103  Pulse: 70  Resp: (!) 24  Temp: 98 F (36.7 C)  TempSrc: Temporal  SpO2: 98%  Weight: (!) 309 lb 11.2 oz (140.5 kg)  Height: 5\' 2"  (1.575 m)   Body mass index is 56.64 kg/m.   General:  WDWN in NAD; vital signs documented above Gait: Not observed HENT: WNL, normocephalic Pulmonary: normal non-labored breathing without wheezing Cardiac: regular HR; without carotid bruits Abdomen: soft, obese, NT, aortic pulse is not palpable Skin: without rashes Vascular Exam/Pulses:  Right Left  Radial 2+ (normal) 2+ (normal)  DP Multiphasic doppler flow Multiphasic doppler flow  PT Multiphasic doppler flow Multiphasic doppler flow  Peroneal Multiphasic doppler flow Multiphasic doppler flow   Extremities:   Left lateral leg  Left leg  Right leg   Neurologic: A&O X 3;  moving all extremities equally Psychiatric:  The pt has Normal affect.   Non-Invasive Vascular Imaging:   Venous duplex on 03/30/2023: +--------------+---------+------+-----------+------------+--------+  LEFT         Reflux NoRefluxReflux TimeDiameter cmsComments                          Yes                                   +--------------+---------+------+-----------+------------+--------+  CFV                    yes   >1 second                       +--------------+---------+------+-----------+------------+--------+   FV mid        no                                              +--------------+---------+------+-----------+------------+--------+  Popliteal    no                                              +--------------+---------+------+-----------+------------+--------+  GSV at SFJ              yes    >500 ms      .491              +--------------+---------+------+-----------+------------+--------+  GSV prox thigh          yes    >500 ms      .410              +--------------+---------+------+-----------+------------+--------+  GSV mid thigh no                            .274              +--------------+---------+------+-----------+------------+--------+  GSV dist thighno                            .268              +--------------+---------+------+-----------+------------+--------+  GSV at knee   no                            .177              +--------------+---------+------+-----------+------------+--------+  GSV prox calf no                            .152              +--------------+---------+------+-----------+------------+--------+  SSV Pop Fossa no                            .323              +--------------+---------+------+-----------+------------+--------+  SSV prox calf no                            .243              +--------------+---------+------+-----------+------------+--------+   Summary:  Left:  - No evidence of deep vein thrombosis seen in the left lower extremity, from the common femoral through the popliteal veins.  - No evidence of superficial venous thrombosis in the left lower extremity.  - Venous reflux is noted in the left common femoral vein.  - Venous reflux is noted in the left sapheno-femoral junction.  - Venous reflux is noted in the left greater saphenous vein in the thigh.     [name] is a 62 y.o. male who presents with: BLE swelling    -pt does not have palpable pedal pulses  but he does have brisk multiphasic doppler flow in both feet.   -pt does not have evidence of DVT.  Pt does have venous reflux in the deep left venous system as well as the GSV at the [facility] and proximal thigh measuring 0.41cm.  He is not a candidate for laser ablation.  He has hx of CHF and CKD, which is contributing to his lower extremity edema -discussed with pt about wearing knee high 15-20  mmHg compression stockings and pt was measured for these today.   Advised him to put them on in the am and take them off at night.   -discussed the importance of leg elevation and how to elevate properly - pt is advised to elevate their legs and a diagram is given to them to demonstrate for pt to lay flat on their back with knees elevated and slightly bent with their feet higher than their knees, which puts their feet higher than their heart for 15-20 minutes per day.  If pt cannot lay flat, advised to lay as flat as possible.  -pt is advised to continue as much walking as possible and avoid sitting or standing for long periods of time.  This will be difficult for him given he is blind and unable to walk long distance due to arthritis in the right knee.   -discussed importance of weight loss and exercise and that water aerobics would also be beneficial.  -handout with recommendations given -pt will f/u as needed   Doreatha Massed, [facility] [phone]  Clinic MD:  Myra Gianotti

## 2023-04-03 NOTE — Progress Notes (Signed)
EPIC Encounter for ICM Monitoring  Patient Name: [name] is a 62 y.o. male Date: 04/03/2023 Primary Care Physican: Nestor Ramp, MD Primary Cardiologist: Sharyn Lull  Electrophysiologist: Graciela Husbands Nephrologist: [facility] 08/25/2022 Office Weight: 303 lbs  12/10/2022 Office Weight: 313 lbs                                                           Spoke with patient and heart failure questions reviewed.  Transmission results reviewed.  Pt asymptomatic for fluid accumulation.  Reports feeling well at this time and voices no complaints.  No fluid symptoms during decreased impedance.   Optivol thoracic impedance suggesting possible fluid accumulation starting 12/10 and returned to normal 12/22.   Prescribed:  Furosemide 40 mg take 1 tablet(s) (40 mg total) by mouth as directed ( prescribed by Dr Jennette Kettle 7/24)   Labs: 12/10/2022 Creatinine 1.66, BUN 21, Potassium 4.0, Sodium 143, GFR 46 10/29/2022 Creatinine 1.65, BUN 21, Potassium 4.2, Sodium 144, GFR 46 04/23/2022 Creatinine 1.44, BUN 12, Potassium 4.7, Sodium 146, GFR 55  A complete set of results can be found in Results Review.   Recommendations:  Encouraged to limit salt intake.  No changes and encouraged to call if experiencing any fluid symptoms.   Follow-up plan: ICM clinic phone appointment on 05/04/2023.   91 day device clinic remote transmission 05/29/2023.     EP/Cardiology Office Visits:  Recall 08/20/2023 with Dr. Graciela Husbands.     Copy of ICM check sent to Dr. Graciela Husbands.   3 month ICM trend: 03/30/2023.    12-14 Month ICM trend:     Karie Soda, RN 04/03/2023 3:37 PM

## 2023-04-09 ENCOUNTER — Other Ambulatory Visit: Payer: Self-pay

## 2023-04-10 MED ORDER — QUETIAPINE FUMARATE 50 MG PO TABS: 150.0000 mg | ORAL_TABLET | Freq: Every day | ORAL | 3 refills | Status: DC

## 2023-04-13 ENCOUNTER — Other Ambulatory Visit: Payer: Self-pay

## 2023-04-13 NOTE — Telephone Encounter (Signed)
 [name] calls nurse line requesting a refill on Flovent .   She reports she has heard some wheezing over the weekend. She denies any distress and he has been speaking in full sentences.   She reports he has an apt with PCP on 1/15.  ED precautions discussed.   Will forward to PCP.

## 2023-04-14 ENCOUNTER — Other Ambulatory Visit: Payer: Self-pay

## 2023-04-14 ENCOUNTER — Telehealth: Payer: Self-pay

## 2023-04-14 DIAGNOSIS — G8929 Other chronic pain: Secondary | ICD-10-CM

## 2023-04-14 DIAGNOSIS — M170 Bilateral primary osteoarthritis of knee: Secondary | ICD-10-CM

## 2023-04-14 MED ORDER — FLUTICASONE PROPIONATE HFA 110 MCG/ACT IN AERO: 2.0000 | INHALATION_SPRAY | Freq: Two times a day (BID) | RESPIRATORY_TRACT | 0 refills | Status: DC

## 2023-04-14 NOTE — Telephone Encounter (Signed)
 Received call from [name], regarding issues with Flovent  inhaler. She reports that medication needs prior authorization.   Called and spoke with pharmacist regarding issue.   He reports that Arnuity Ellipta  is preferred by insurance. If appropriate, please send insurance preferred alternative.   [name] English, RN

## 2023-04-14 NOTE — Telephone Encounter (Signed)
 Patient's wife calls nurse line requesting refills on pain medication and insulin .   She is asking if Dr. Rosalynn can send 15 mL (5 pens), so that patient is not having to pay $4 copay as often.   Pended medications to this encounter.   Forwarding to PCP.   [name] English, RN

## 2023-04-16 ENCOUNTER — Other Ambulatory Visit: Payer: Self-pay | Admitting: Internal Medicine

## 2023-04-16 DIAGNOSIS — I480 Paroxysmal atrial fibrillation: Secondary | ICD-10-CM

## 2023-04-16 MED ORDER — INSULIN GLARGINE-YFGN 100 UNIT/ML ~~LOC~~ SOPN: 25.0000 [IU] | PEN_INJECTOR | Freq: Every day | SUBCUTANEOUS | 0 refills | Status: DC

## 2023-04-16 MED ORDER — HYDROCODONE-ACETAMINOPHEN 5-325 MG PO TABS: ORAL_TABLET | ORAL | 0 refills | Status: DC

## 2023-04-16 MED ORDER — ARNUITY ELLIPTA 100 MCG/ACT IN AEPB: INHALATION_SPRAY | RESPIRATORY_TRACT | 12 refills | Status: DC

## 2023-04-16 NOTE — Telephone Encounter (Signed)
 Eliquis 5mg  refill request received. Patient is 63 years old, weight-140.5kg, Crea-1.66 on 12/10/22, Diagnosis-Afib, and last seen by Francis Dowse on 08/25/22. Dose is appropriate based on dosing criteria. Will send in refill to requested pharmacy.

## 2023-04-22 ENCOUNTER — Ambulatory Visit (INDEPENDENT_AMBULATORY_CARE_PROVIDER_SITE_OTHER): Payer: Medicare Other | Admitting: Family Medicine

## 2023-04-22 ENCOUNTER — Encounter: Payer: Self-pay | Admitting: Family Medicine

## 2023-04-22 VITALS — BP 110/72 | HR 95 | Wt 322.0 lb

## 2023-04-22 DIAGNOSIS — E1122 Type 2 diabetes mellitus with diabetic chronic kidney disease: Secondary | ICD-10-CM | POA: Diagnosis not present

## 2023-04-22 DIAGNOSIS — I10 Essential (primary) hypertension: Secondary | ICD-10-CM | POA: Diagnosis present

## 2023-04-22 DIAGNOSIS — Z23 Encounter for immunization: Secondary | ICD-10-CM | POA: Diagnosis not present

## 2023-04-22 DIAGNOSIS — N184 Chronic kidney disease, stage 4 (severe): Secondary | ICD-10-CM

## 2023-04-22 DIAGNOSIS — N1831 Chronic kidney disease, stage 3a: Secondary | ICD-10-CM | POA: Diagnosis not present

## 2023-04-22 DIAGNOSIS — H5316 Psychophysical visual disturbances: Secondary | ICD-10-CM | POA: Diagnosis not present

## 2023-04-22 DIAGNOSIS — Z91148 Patient's other noncompliance with medication regimen for other reason: Secondary | ICD-10-CM | POA: Diagnosis not present

## 2023-04-22 DIAGNOSIS — Z758 Other problems related to medical facilities and other health care: Secondary | ICD-10-CM

## 2023-04-22 LAB — POCT GLYCOSYLATED HEMOGLOBIN (HGB A1C): HbA1c, POC (controlled diabetic range): 7.1 % — AB (ref 0.0–7.0)

## 2023-04-22 NOTE — Patient Instructions (Signed)
 I will send you a note about your labs.  If you have any trouble with your medications running out, please call the office and leave me that message.  I will have one of the social workers call your wife see if there is something we can do to get this speeded up.  Let me see you back in 3 months.  Great to see you!

## 2023-04-23 ENCOUNTER — Telehealth: Payer: Self-pay

## 2023-04-23 DIAGNOSIS — Z91148 Patient's other noncompliance with medication regimen for other reason: Secondary | ICD-10-CM | POA: Insufficient documentation

## 2023-04-23 LAB — COMPREHENSIVE METABOLIC PANEL
ALT: 23 [IU]/L (ref 0–44)
AST: 24 [IU]/L (ref 0–40)
Albumin: 4 g/dL (ref 3.9–4.9)
Alkaline Phosphatase: 79 [IU]/L (ref 44–121)
BUN/Creatinine Ratio: 11 (ref 10–24)
BUN: 19 mg/dL (ref 8–27)
Bilirubin Total: 0.6 mg/dL (ref 0.0–1.2)
CO2: 24 mmol/L (ref 20–29)
Calcium: 9 mg/dL (ref 8.6–10.2)
Chloride: 107 mmol/L — ABNORMAL HIGH (ref 96–106)
Creatinine, Ser: 1.72 mg/dL — ABNORMAL HIGH (ref 0.76–1.27)
Globulin, Total: 3.1 g/dL (ref 1.5–4.5)
Glucose: 161 mg/dL — ABNORMAL HIGH (ref 70–99)
Potassium: 4.5 mmol/L (ref 3.5–5.2)
Sodium: 145 mmol/L — ABNORMAL HIGH (ref 134–144)
Total Protein: 7.1 g/dL (ref 6.0–8.5)
eGFR: 44 mL/min/{1.73_m2} — ABNORMAL LOW (ref >59)

## 2023-04-23 NOTE — Telephone Encounter (Signed)
Patient's wife calls nurse line regarding allopurinol prescription.   She reports that when they attempt to break pill in half, to equal 50 mg, the pills turns into "dust."   She is asking if there is an alternative brand that may not break as easily. She reports that they do not have a pill cutter at home so they are breaking in half by hand.   Advised that I would reach out to PCP regarding concern, however, pill cutter would likely be the best option, as the blade allows for a more direct cut of the medication.   Veronda Prude, RN

## 2023-04-23 NOTE — Assessment & Plan Note (Signed)
He has been followed by nephrology.  Will continue current medication regimen and check labs today.

## 2023-04-23 NOTE — Assessment & Plan Note (Signed)
Not totally sure understand his difficulties with his insurance but will ask social work to contact him for further evaluation as I do not want him to be without any of his medicines. He asks me to have them contact his wife, [name], as she handles his meds and finances etc. Kamsiyochukwu Macvicar :[phone] Judie Petit)

## 2023-04-23 NOTE — Assessment & Plan Note (Signed)
Blood pressure looks good today.  No medication changes.  Will check some labs today including BMP.

## 2023-04-23 NOTE — Progress Notes (Signed)
    CHIEF COMPLAINT / HPI: #1.  Follow-up on leg wound: Doing much better.  He is being diligent about elevating his legs.  He has started sleeping in his bed again instead of sitting in the recliner.  He is also applying Vaseline to his legs bilaterally every night and wearing his support hose. #2.  Diabetes mellitus: He is not checking his blood sugars but he feels like he has been doing pretty well with his diet. #3.  Continues on his medication for sleep/hallucinations related to his blindness.  Not having any episodes of hallucinations on the current dose and does not wish to change it. #4.  Having some difficulty with some of his insurance.  Evidently he did not do the required paperwork to update his Medicaid so he is having difficulty with some of his prescriptions.  He and his wife are estranged but she helps him manage his medications.   PERTINENT  PMH / PSH: I have reviewed the patient's medications, allergies, past medical and surgical history, smoking status and updated in the EMR as appropriate.   OBJECTIVE:  BP 110/72   Pulse 95   Wt (!) 322 lb (146.1 kg)   SpO2 (!) 88%   BMI 58.89 kg/m  Vital signs reviewed. GENERAL: Well-developed, well-nourished, no acute distress Needs assitance with walking for vision and gait stability. Using cane. CARDIOVASCULAR: Regular rate and rhythm no murmur gallop or rub LUNGS: Clear to auscultation bilaterally, no rales or wheeze. ABDOMEN: Soft positive bowel sounds; obese NEURO: blind. Antalgic unsteady gait MSK: Movement of extremity x 4. Stiff knees bilaterally. PSYCH: AxOx4. Good eye contact.. No psychomotor retardation or agitation. Appropriate speech fluency and content. Asks and answers questions appropriately. Mood is congruent.     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  Will continue current dosing.  He does not need refills today.  Follow-up 3 months for this.  HYPERTENSION,  BENIGN Blood pressure looks good today.  No medication changes.  Will check some labs today including BMP.  RENAL DISEASE, CHRONIC, STAGE III He has been followed by nephrology.  Will continue current medication regimen and check labs today.  Difficulty obtaining medication Not totally sure understand his difficulties with his insurance but will ask social work to contact him for further evaluation as I do not want him to be without any of his medicines. He asks me to have them contact his wife, [name], as she handles his meds and finances etc. Jacky Rockwell :[phone] Judie Petit)    Denny Levy MD

## 2023-04-23 NOTE — Assessment & Plan Note (Signed)
Has done really well on Seroquel.  Will continue current dosing.  He does not need refills today.  Follow-up 3 months for this.

## 2023-04-24 ENCOUNTER — Telehealth: Payer: Self-pay | Admitting: *Deleted

## 2023-04-24 ENCOUNTER — Other Ambulatory Visit: Payer: Self-pay | Admitting: *Deleted

## 2023-04-24 NOTE — Patient Instructions (Addendum)
Care Management   Initial Visit Note  04/24/2023 Name: [name] MRN: [id number] DOB: 1960/10/09  Mal [name] is enrolled in a Managed Medicaid plan: No. Outreach attempt today was successful.   Subjective:   Objective:  Assessment: [name] is a 63 y.o. year old male who sees Nestor Ramp, MD for primary care. The care management team was consulted for assistance with care management and care coordination needs related to Disease Management, Educational Needs, Care Coordination, Medication Management and Education, and Medication Assistance .   Review of patient status, including review of consultants reports, relevant laboratory and other test results, and collaboration with appropriate care team members and the patient's provider was performed as part of comprehensive patient evaluation and provision of care management services.    SDOH (Social Determinants of Health) screening performed today. See Care Plan Entry related to challenges with: Financial Strain   Goals Addressed             This Visit's Progress    RNCM Care Management Expected Outcomes: Monitor, Self-Manage and Reduce Symptoms of: DM, CHF, CKD       Current Barriers:  Knowledge Deficits related to plan of care for management of CHF, DMII, and CKD Stage 3A  Care Coordination needs related to Medicaid application assistance Chronic Disease Management support and education needs related to CHF, DMII, and CKD Stage 3A  Financial Constraints  Literacy barriers  RNCM Clinical Goal(s):  Patient will verbalize basic understanding of  CHF, DMII, and CKD Stage 3A disease process and self health management plan as evidenced by verbal explanation, recognizing symptoms, lifestyle modifications attend all scheduled medical appointments: with primary care provider and specialist as evidenced by keeping all scheduled appointments demonstrate Improved and Ongoing adherence to prescribed treatment plan for  CHF, DMII, and CKD Stage 3A as evidenced by consistent medication compliance, symptom monitoring, continued lifestyle modifications continue to work with RN Care Manager to address care management and care coordination needs related to  CHF, DMII, and CKD Stage 3A as evidenced by adherence to CM Team Scheduled appointments work with pharmacist to address Financial constraints related to medication obtainment related toCHF, DMII, and CKD Stage 3A as evidenced by review or EMR and patient or pharmacist report work with Child psychotherapist to address  related to the management of Financial constraints related to insurance/medicaid application related to the management of CHF, DMII, and CKD Stage 3A as evidenced by review of EMR and patient or Child psychotherapist report through collaboration with Medical illustrator, provider, and care team.   Interventions: Evaluation of current treatment plan related to  self management and patient's adherence to plan as established by provider  RNCM received request for acute call related to patient being out of his medication due to financial constraints. Per [name], DPR/Ex wife they are unable to afford under his [name] plan. She has taken the liberty and switched him to [facility] that does not take effect until 05-09-2023. She reports that he has been out of his insulin, Semglee since 04-11-2023. She states she found a box of insulin in a drawer that the patient assumed was an Epipen. She states she has been giving him 25 units of Novolog daily since 04-11-2023. She reports that he does not check his blood sugar nor does he have a blood glucose meter. RNCM advised that the Novolog that she has been administering is short acting and does not work the same as Soil scientist which is a Risk analyst  insulin. She also reports that the patient has been wheezing and is out of his inhaler. RNCM advised patient to go to the ED and patient refused.  Heart Failure Interventions:  (Status:  New goal. and Goal  on track:  Yes.) Long Term Goal Basic overview and discussion of pathophysiology of Heart Failure reviewed. Per [name], [place] patient does not weigh daily and they are monitored remotely via heart monitor. She reports that the patient has been wheezing today and he was out of his inhaler. RNCM advised that if he is in respiratory distress that he needs to go to the ER, patient refused. RNCM phone [name] and inhaler is $12.15, [name] is aware. Provided education on low sodium diet Reviewed Heart Failure Action Plan in depth and provided written copy Assessed need for readable accurate scales in home Provided education about placing scale on hard, flat surface Advised patient to weigh each morning after emptying bladder Discussed importance of daily weight and advised patient to weigh and record daily Reviewed role of diuretics in prevention of fluid overload and management of heart failure; Discussed the importance of keeping all appointments with provider Provided patient with education about the role of exercise in the management of heart failure Screening for signs and symptoms of depression related to chronic disease state  Assessed social determinant of health barriers    Chronic Kidney Disease Interventions:  (Status:  New goal. and Goal on track:  Yes.) Long Term Goal Evaluation of current treatment plan related to chronic kidney disease self management and patient's adherence to plan as established by provider      Provided education to patient re: stroke prevention, s/s of heart attack and stroke    Reviewed medications with patient and discussed importance of compliance    Counseled on adverse effects of illicit drug and excessive alcohol use in patients with chronic kidney disease    Counseled on the importance of exercise goals with target of 150 minutes per week     Advised patient, providing education and rationale, to monitor blood pressure daily and record, calling PCP  for findings outside established parameters    Discussed complications of poorly controlled blood pressure such as heart disease, stroke, circulatory complications, vision complications, kidney impairment, sexual dysfunction    Reviewed scheduled/upcoming provider appointments including    Advised patient to discuss new changes with provider    Discussed plans with patient for ongoing care management follow up and provided patient with direct contact information for care management team    Screening for signs and symptoms of depression related to chronic disease state      Discussed the impact of chronic kidney disease on daily life and mental health and acknowledged and normalized feelings of disempowerment, fear, and frustration    Assessed social determinant of health barriers    Last practice recorded BP readings:  BP Readings from Last 3 Encounters:  04/22/23 110/72  03/30/23 (!) 149/103  03/17/23 (!) 143/98   Most recent eGFR/CrCl:  Lab Results  Component Value Date   EGFR 44 (L) 04/22/2023    No components found for: "CRCL"    Diabetes Interventions:  (Status:  New goal. and Goal on track:  NO.) Long Term Goal Assessed patient's understanding of A1c goal: <7% Provided education to patient about basic DM disease process Reviewed medications with patient and discussed importance of medication adherence. Per [name], patient currently unable to afford his Semglee and she has been administering Novolog in place of it. RNCM  advised that blood sugar should be checked prior to administration of  insulin. She states the patient does not have a way to check his blood sugar. RNCM phone [facility] to verify medication is ready and its cost of $4.90 Counseled on importance of regular laboratory monitoring as prescribed Discussed plans with patient for ongoing care management follow up and provided patient with direct contact information for care management team Provided patient with  written educational materials related to hypo and hyperglycemia and importance of correct treatment Reviewed scheduled/upcoming provider appointments including:   Advised patient, providing education and rationale, to check cbg as recommended and record, calling provider for findings outside established parameters Referral made to pharmacy team for assistance with medication obtainment. RNCM to have pharmD call and speak with [facility] [place] [facility] regarding patient insulin. Review of patient status, including review of consultants reports, relevant laboratory and other test results, and medications completed Screening for signs and symptoms of depression related to chronic disease state  Assessed social determinant of health barriers Lab Results  Component Value Date   HGBA1C 7.1 (A) 04/22/2023    Patient Goals/Self-Care Activities: Take all medications as prescribed Attend all scheduled provider appointments Call pharmacy for medication refills 3-7 days in advance of running out of medications Attend church or other social activities Perform all self care activities independently  Call provider office for new concerns or questions  Work with the social worker to address care coordination needs and will continue to work with the clinical team to address health care and disease management related needs call office if I gain more than 2 pounds in one day or 5 pounds in one week keep legs up while sitting watch for swelling in feet, ankles and legs every day follow rescue plan if symptoms flare-up schedule appointment with eye doctor check feet daily for cuts, sores or redness enter blood sugar readings and medication or insulin into daily log take the blood sugar log to all doctor visits  Follow Up Plan:  Telephone follow up appointment with care management team member scheduled for:  05-25-2023 at 1:00 pm             Follow up plan:  Telephone follow up appointment with care management team  member scheduled for:05-25-2023 at 1:00 pm  Mr. Bessey was given information about Care Management services today including:  Care Management services include personalized support from designated clinical staff supervised by a physician, including individualized plan of care and coordination with other care providers 24/7 contact phone numbers for assistance for urgent and routine care needs. The patient may stop CCM services at any time (effective at the end of the month) by phone call to the office staff.  Patient agreed to services and verbal consent obtained.  Rosalene Billings, BSN RN [facility], [facility] RN Care Manager Direct Dial: [phone]  Fax: [phone]

## 2023-04-24 NOTE — Progress Notes (Signed)
Complex Care Management Note  Care Guide Note 04/24/2023 Name: BAYLIN SUBA MRN: [id number] DOB: 01/25/1961  [name] is a 63 y.o. year old male who sees Nestor Ramp, MD for primary care. I reached out to [name] by phone today to offer complex care management services.  Mr. Settle was given information about Complex Care Management services today including:   The Complex Care Management services include support from the care team which includes your Nurse Coordinator, Clinical Social Worker, or Pharmacist.  The Complex Care Management team is here to help remove barriers to the health concerns and goals most important to you. Complex Care Management services are voluntary, and the patient may decline or stop services at any time by request to their care team member.   Complex Care Management Consent Status: Patient agreed to services and verbal consent obtained.   Follow up plan:  Telephone appointment with complex care management team member scheduled for:  04/28/23  Encounter Outcome:  Patient Scheduled  Gwenevere Ghazi  [facility]  [facility], [facility] Guide  Direct Dial: [phone]  Fax [phone]

## 2023-04-24 NOTE — Patient Outreach (Signed)
  Care Management   Visit Note  04/24/2023 Name: [name] MRN: [id number] DOB: 05/21/1960  Subjective: [name] is a 63 y.o. year old male who is a primary care patient of Nestor Ramp, MD. The Care Management team was consulted for assistance.      Engaged with patient spoke with the family member (POA, [place], [place]). Collaborated with PharmD  RNCM received secure chat from Nilda Simmer, PharmD with instructions to update patient. Santina Evans advised for patient to start his Semglee as prescribed and to discontinue the Novolog as this is not prescribed. [name], DPR verbalized full understanding and states they have already discarded the Novolog.   Consent to Services:  Patient was given information about care management services, agreed to services, and gave verbal consent to participate.   Plan:  RNCM has requested for PharmD to follow up with patient to ensure compliance and understanding regarding his medications  Rosalene Billings, BSN RN [facility]  [facility], [facility] RN Care Manager Direct Dial: [phone]  Fax: [phone]

## 2023-04-24 NOTE — Patient Outreach (Signed)
Care Management   Visit Note  04/24/2023 Name: [name] MRN: [id number] DOB: [birth year]  Subjective: [name] is a 63 y.o. year old male who is a primary care patient of Adam Ramp, MD. The Care Management Dalton was consulted for assistance.      Engaged with patient spoke with the family member (POA, [name], [place]).    Goals Addressed             This Visit's Progress    RNCM Care Management Expected Outcomes: Monitor, Self-Manage and Reduce Symptoms of: DM, CHF, CKD       Current Barriers:  Knowledge Deficits related to plan of care for management of CHF, DMII, and CKD Stage 3A  Care Coordination needs related to Medicaid application assistance Chronic Disease Management support and education needs related to CHF, DMII, and CKD Stage 3A  Financial Constraints  Literacy barriers  RNCM Clinical Goal(s):  Patient will verbalize basic understanding of  CHF, DMII, and CKD Stage 3A disease process and self health management plan as evidenced by verbal explanation, recognizing symptoms, lifestyle modifications attend all scheduled Adam appointments: with primary care provider and specialist as evidenced by keeping all scheduled appointments demonstrate Improved and Ongoing adherence to prescribed treatment plan for CHF, DMII, and CKD Stage 3A as evidenced by consistent medication compliance, symptom monitoring, continued lifestyle modifications continue to work with RN Care Manager to address care management and care coordination needs related to  CHF, DMII, and CKD Stage 3A as evidenced by adherence to CM Dalton Scheduled appointments work with pharmacist to address Financial constraints related to medication obtainment related toCHF, DMII, and CKD Stage 3A as evidenced by review or EMR and patient or pharmacist report work with [name] to address  related to the management of Financial constraints related to insurance/medicaid application related to the  management of CHF, DMII, and CKD Stage 3A as evidenced by review of EMR and patient or [name] report through collaboration with [name].   Interventions: Evaluation of current treatment plan related to  self management and patient's adherence to plan as established by provider  RNCM received request for acute call related to patient being out of his medication due to financial constraints. Per [name], [name] they are unable to afford under his [facility] plan. She has taken the liberty and switched him to [name] that does not take effect until 05-09-2023. She reports that he has been out of his insulin, [name] since 04-11-2023. She states she found a box of insulin in a drawer that the patient assumed was an [name]. She states she has been giving him 25 units of [name] daily since 04-11-2023. She reports that he does not check his blood sugar nor does he have a blood glucose meter. RNCM advised that the [name] that she has been administering is short acting and does not work the same as [name] which is a long acting insulin. She also reports that the patient has been wheezing and is out of his inhaler. RNCM advised patient to go to the ED and patient refused.  Heart Failure Interventions:  (Status:  New goal. and Goal on track:  Yes.) Long Term Goal Basic overview and discussion of pathophysiology of Heart Failure reviewed. Per [name], [place] patient does not weigh daily and they are monitored remotely via heart monitor. She reports that the patient has been wheezing today and he was out of his inhaler. RNCM advised that if  he is in respiratory distress that he needs to go to the ER, patient refused. [name] and inhaler is $12.15, [name] is aware. Provided education on low sodium diet Reviewed Heart Failure Action Plan in depth and provided written copy Assessed need for readable accurate scales in home Provided education about placing scale  on hard, flat surface Advised patient to weigh each morning after emptying bladder Discussed importance of daily weight and advised patient to weigh and record daily Reviewed role of diuretics in prevention of fluid overload and management of heart failure; Discussed the importance of keeping all appointments with provider Provided patient with education about the role of exercise in the management of heart failure Screening for signs and symptoms of depression related to chronic disease state  Assessed social determinant of health barriers    Chronic Kidney Disease Interventions:  (Status:  New goal. and Goal on track:  Yes.) Long Term Goal Evaluation of current treatment plan related to chronic kidney disease self management and patient's adherence to plan as established by provider      Provided education to patient re: stroke prevention, s/s of heart attack and stroke    Reviewed medications with patient and discussed importance of compliance    Counseled on adverse effects of illicit drug and excessive alcohol use in patients with chronic kidney disease    Counseled on the importance of exercise goals with target of 150 minutes per week     Advised patient, providing education and rationale, to monitor blood pressure daily and record, calling PCP for findings outside established parameters    Discussed complications of poorly controlled blood pressure such as heart disease, stroke, circulatory complications, vision complications, kidney impairment, sexual dysfunction    Reviewed scheduled/upcoming provider appointments including    Advised patient to discuss new changes with provider    Discussed plans with patient for ongoing care management follow up and provided patient with direct contact information for care management Dalton    Screening for signs and symptoms of depression related to chronic disease state      Discussed the impact of chronic kidney disease on daily life and mental  health and acknowledged and normalized feelings of disempowerment, fear, and frustration    Assessed social determinant of health barriers    Last practice recorded BP readings:  BP Readings from Last 3 Encounters:  04/22/23 110/72  03/30/23 (!) 149/103  03/17/23 (!) 143/98   Most recent eGFR/CrCl:  Lab Results  Component Value Date   EGFR 44 (L) 04/22/2023    No components found for: "CRCL"    Diabetes Interventions:  (Status:  New goal. and Goal on track:  NO.) Long Term Goal Assessed patient's understanding of A1c goal: <7% Provided education to patient about basic DM disease process Reviewed medications with patient and discussed importance of medication adherence. Per [name], patient currently unable to afford his [name] and she has been administering [name] in place of it. RNCM advised that blood sugar should be checked prior to administration of  insulin. She states the patient does not have a way to check his blood sugar. RNCM phone [facility] to verify medication is ready and its cost of $4.90 Counseled on importance of regular laboratory monitoring as prescribed Discussed plans with patient for ongoing care management follow up and provided patient with direct contact information for care management Dalton Provided patient with written educational materials related to hypo and hyperglycemia and importance of correct treatment Reviewed scheduled/upcoming provider appointments  including:   Advised patient, providing education and rationale, to check cbg as recommended and record, calling provider for findings outside established parameters Referral made to pharmacy Dalton for assistance with medication obtainment. RNCM to have pharmD call and speak with [facility] regarding patient insulin. Review of patient status, including review of consultants reports, relevant laboratory and other test results, and medications completed Screening for signs and symptoms of depression related  to chronic disease state  Assessed social determinant of health barriers Lab Results  Component Value Date   HGBA1C 7.1 (A) 04/22/2023    Patient Goals/Self-Care Activities: Take all medications as prescribed Attend all scheduled provider appointments Call pharmacy for medication refills 3-7 days in advance of running out of medications Attend church or other social activities Perform all self care activities independently  Call provider office for new concerns or questions  Work with the social worker to address care coordination needs and will continue to work with the clinical Dalton to address health care and disease management related needs call office if I gain more than 2 pounds in one day or 5 pounds in one week keep legs up while sitting watch for swelling in feet, ankles and legs every day follow rescue plan if symptoms flare-up schedule appointment with eye doctor check feet daily for cuts, sores or redness enter blood sugar readings and medication or insulin into daily log take the blood sugar log to all doctor visits  Follow Up Plan:  Telephone follow up appointment with care management Dalton member scheduled for:  05-25-2023 at 1:00 pm             Consent to Services:  Patient was given information about care management services, agreed to services, and gave verbal consent to participate.   Plan: Telephone follow up appointment with care management Dalton member scheduled for:05-25-2023 at 1:00 pm  Rosalene Billings, BSN RN [facility], [facility] RN Care Manager Direct Dial: [phone]  Fax: [phone]

## 2023-04-28 ENCOUNTER — Ambulatory Visit: Payer: Self-pay | Admitting: Licensed Clinical Social Worker

## 2023-04-28 NOTE — Patient Instructions (Addendum)
Visit Information  Thank you for taking time to visit with me today. Please don't hesitate to contact me if I can be of assistance to you.   Following are the goals we discussed today:   Goals Addressed             This Visit's Progress    Care Coordination       Pt will contact [employer] and [facility] Dept to apply to prescription assistance.  Ms. Meloy will engage in self care activities to reduce stress        Our next appointment is by telephone on 05/12/2023 at 2pm  Please call the care guide team at [phone] if you need to cancel or reschedule your appointment.   If you are experiencing a Mental Health or Behavioral Health Crisis or need someone to talk to, please call 911   Patient verbalizes understanding of instructions and care plan provided today and agrees to view in MyChart. Active MyChart status and patient understanding of how to access instructions and care plan via MyChart confirmed with patient.     Telephone follow up appointment with care management team member scheduled for:05/12/2023  Gwyndolyn Saxon MSW, LCSW Licensed Clinical Social Worker  [facility], Population Health Direct Dial: [phone]  Fax: [phone]

## 2023-04-28 NOTE — Patient Outreach (Signed)
  Care Coordination   Initial Visit Note   04/28/2023 Name: TRENTEN TWOHIG MRN: [id number] DOB: Sep 03, 1960  [name] is a 63 y.o. year old male who sees Nestor Ramp, MD for primary care. I spoke with  [name] ex spouse S Minetti by phone today.  What matters to the patients health and wellness today?  Pt requested assistance with purchasing medication. Provided caregiver emotional support.    Goals Addressed             This Visit's Progress    Care Coordination       Pt will contact [employer] and [facility] Dept to apply to prescription assistance.  Ms. Grudzinski will engage in self care activities to reduce stress        SDOH assessments and interventions completed:  Yes  SDOH Interventions Today    Flowsheet Row Most Recent Value  SDOH Interventions   Food Insecurity Interventions Intervention Not Indicated  Housing Interventions Intervention Not Indicated  Transportation Interventions Patient Resources (Friends/Family)  Utilities Interventions Intervention Not Indicated        Care Coordination Interventions:  Yes, provided  Interventions Today    Flowsheet Row Most Recent Value  Education Interventions   Education Provided Provided Education  Provided Verbal Education On [employer], Insurance Plans  [place] spouse of [employer]ist, [place] [facility] prescription assistance program.]  Mental Health Interventions   Mental Health Discussed/Reviewed Mental Health Reviewed, Coping Strategies  [provided emotional support for caregiver.]       Follow up plan: Follow up call scheduled for 05/19/2023    Encounter Outcome:  Patient Visit Completed   Gwyndolyn Saxon MSW, LCSW Licensed Clinical Social Worker  [facility], Population Health Direct Dial: [phone]  Fax: [phone]

## 2023-04-29 ENCOUNTER — Telehealth: Payer: Self-pay | Admitting: Pharmacist

## 2023-04-29 NOTE — Telephone Encounter (Signed)
-----   Message from [employer] sent at 04/24/2023  2:39 PM EST ----- Hi Pete,   I was looped in on questions about this patient today - I knew you were with the learners so I didn't bother you. Please see the RN Case Manager, Ashley's, note.   My understanding is the patient's concerns about the cost of the medications has been resolved. Morrie Sheldon did not know whether she should ask the patient to restart Semglee, given he had been taking Novolog. I advised her to tell the patient to pick up and start Semglee as soon as he could and stop Novolog.   There were also concerns about wheezing since he had been out of Arnuity. It looks like the PFTs you did in 2016 were normal, but we all know things could change (or it could be fluid related).   Could you please try to contact the patient's wife, [name], next week to check in on how the patient is doing and ensure these access/adherence barriers have been resolved? Looking at the copays Morrie Sheldon put in her note, I expect this patient has Medicare Extra Help, so nothing further that can be done about the cost of the meds. He is scheduled to talk to social work later this month so hopefully there could be some other SDOH/financial support options they could offer.   THANKS!  Catie

## 2023-04-29 NOTE — Telephone Encounter (Signed)
Patient contacted for follow-up of medication access issue identified by case manager, last week.   Talked with patient's spouse - [name].  Since last contact spouse reports that access issues have been resolved.  [name] has adequate supply of all medication until February when new insurance goes into effect.  Medication issues have been resolved.   She thanked me for the call.   Total time with patient call and documentation of interaction: 12 minutes.  F/U Phone call planned: None

## 2023-04-30 NOTE — Telephone Encounter (Signed)
Reviewed and agree with Dr Koval's plan.   

## 2023-05-04 ENCOUNTER — Ambulatory Visit: Payer: Medicare Other | Attending: Internal Medicine

## 2023-05-04 DIAGNOSIS — Z95810 Presence of automatic (implantable) cardiac defibrillator: Secondary | ICD-10-CM | POA: Diagnosis not present

## 2023-05-04 DIAGNOSIS — I5022 Chronic systolic (congestive) heart failure: Secondary | ICD-10-CM

## 2023-05-08 NOTE — Progress Notes (Signed)
EPIC Encounter for ICM Monitoring  Patient Name: [name] is a 63 y.o. male Date: 05/08/2023 Primary Care Physican: Nestor Ramp, MD Primary Cardiologist: Sharyn Lull  Electrophysiologist: Graciela Husbands Nephrologist: [facility] 08/25/2022 Office Weight: 303 lbs  12/10/2022 Office Weight: 313 lbs                                                           Spoke with patient and heart failure questions reviewed.  Transmission results reviewed.  Pt asymptomatic for fluid accumulation.  Reports feeling well at this time and voices no complaints.  No fluid symptoms during decreased impedance.  He reports compliance with lasix but may be drinking more than recommended 64 oz fluid daily.  Son cooks for him.  Unsure of dietary salt intake.   Optivol thoracic impedance suggesting possible fluid accumulation starting 12/23-1/22 and back to normal on 1/23.   Prescribed:  Furosemide 40 mg take 1 tablet(s) (40 mg total) by mouth as directed ( prescribed by Dr Jennette Kettle 7/24)   Labs: 12/10/2022 Creatinine 1.66, BUN 21, Potassium 4.0, Sodium 143, GFR 46 10/29/2022 Creatinine 1.65, BUN 21, Potassium 4.2, Sodium 144, GFR 46 04/23/2022 Creatinine 1.44, BUN 12, Potassium 4.7, Sodium 146, GFR 55  A complete set of results can be found in Results Review.   Recommendations:  Encouraged to limit salt intake and fluid to 64 oz daily.  No changes and encouraged to call if experiencing any fluid symptoms.   Follow-up plan: ICM clinic phone appointment on 06/08/2023.   91 day device clinic remote transmission 05/29/2023.     EP/Cardiology Office Visits:  Recall 08/20/2023 with Dr. Graciela Husbands.     Copy of ICM check sent to Dr. Graciela Husbands.   3 month ICM trend: 05/04/2023.    12-14 Month ICM trend:     Karie Soda, RN 05/08/2023 4:19 PM

## 2023-05-11 ENCOUNTER — Other Ambulatory Visit: Payer: Self-pay | Admitting: Family Medicine

## 2023-05-12 ENCOUNTER — Ambulatory Visit: Payer: Self-pay | Admitting: Licensed Clinical Social Worker

## 2023-05-12 NOTE — Patient Instructions (Signed)
Visit Information  Thank you for taking time to visit with me today. Please don't hesitate to contact me if I can be of assistance to you.   Following are the goals we discussed today:   Goals Addressed             This Visit's Progress    Care Coordination       Pt will contact [employer] and [facility] Dept to apply to prescription assistance.  Ms. Azer will engage in self care activities to reduce stress LCSW A Felton Clinton mailed second medassist application to pt.          Our next appointment is by telephone on 05/26/2023 at 2pm  Please call the care guide team at [phone] if you need to cancel or reschedule your appointment.   If you are experiencing a Mental Health or Behavioral Health Crisis or need someone to talk to, please call 911   Patient verbalizes understanding of instructions and care plan provided today and agrees to view in MyChart. Active MyChart status and patient understanding of how to access instructions and care plan via MyChart confirmed with patient.     Telephone follow up appointment with care management team member scheduled for:05/26/2023  Gwyndolyn Saxon MSW, LCSW Licensed Clinical Social Worker  [facility], Population Health Direct Dial: [phone]  Fax: [phone]

## 2023-05-12 NOTE — Patient Outreach (Signed)
  Care Coordination   Follow Up Visit Note   05/12/2023 Name: [name] MRN: [id number] DOB: May 24, 1960  [name] [name] is a 63 y.o. year old male who sees Rosalynn Camie LITTIE, MD for primary care. I spoke with  [name] [name] by phone today.  What matters to the patients health and wellness today?  Completed follow up with pt regarding med assist application and advised about [place] prescription program.     Goals Addressed             This Visit's Progress    Care Coordination       Pt will contact [employer] and [facility] Dept to apply to prescription assistance.  Ms. Ramseyer will engage in self care activities to reduce stress LCSW A Neena Beecham mailed second medassist application to pt.          SDOH assessments and interventions completed:  Yes  SDOH Interventions Today    Flowsheet Row Most Recent Value  SDOH Interventions   Food Insecurity Interventions Intervention Not Indicated  Housing Interventions Intervention Not Indicated  Transportation Interventions Intervention Not Indicated  Utilities Interventions Intervention Not Indicated        Care Coordination Interventions:  Yes, provided  Interventions Today    Flowsheet Row Most Recent Value  Pharmacy Interventions   Pharmacy Dicussed/Reviewed Pharmacy Topics Discussed, Affording Medications  [Informed Mr. Wehrenberg about principal financial program and [employer] prescription program. Applicated mailed to patient.]       Follow up plan: Follow up call scheduled for 05/26/2023    Encounter Outcome:  Patient Visit Completed   Alfonso Rummer MSW, LCSW Licensed Clinical Social Worker  [facility], Population Health Direct Dial: [phone]  Fax: [phone]

## 2023-05-13 ENCOUNTER — Other Ambulatory Visit: Payer: Self-pay

## 2023-05-13 DIAGNOSIS — M170 Bilateral primary osteoarthritis of knee: Secondary | ICD-10-CM

## 2023-05-13 DIAGNOSIS — G8929 Other chronic pain: Secondary | ICD-10-CM

## 2023-05-13 MED ORDER — HYDROCODONE-ACETAMINOPHEN 5-325 MG PO TABS: ORAL_TABLET | ORAL | 0 refills | Status: DC

## 2023-05-15 ENCOUNTER — Other Ambulatory Visit: Payer: Self-pay | Admitting: Family Medicine

## 2023-05-18 ENCOUNTER — Other Ambulatory Visit: Payer: Self-pay | Admitting: Family Medicine

## 2023-05-18 DIAGNOSIS — S39012A Strain of muscle, fascia and tendon of lower back, initial encounter: Secondary | ICD-10-CM

## 2023-05-25 ENCOUNTER — Encounter: Payer: Self-pay | Admitting: *Deleted

## 2023-05-25 ENCOUNTER — Other Ambulatory Visit: Payer: Self-pay | Admitting: *Deleted

## 2023-05-25 NOTE — Patient Instructions (Signed)
Visit Information  Thank you for taking time to visit with me today. Please don't hesitate to contact me if I can be of assistance to you before our next scheduled telephone appointment.  Following are the goals we discussed today:   Goals Addressed             This Visit's Progress    RNCM Care Management Expected Outcomes: Monitor, Self-Manage and Reduce Symptoms of: DM, CHF, CKD       Current Barriers:  Knowledge Deficits related to plan of care for management of CHF, DMII, and CKD Stage 3A  Care Coordination needs related to Medicaid application assistance Chronic Disease Management support and education needs related to CHF, DMII, and CKD Stage 3A  Financial Constraints  Literacy barriers  RNCM Clinical Goal(s):  Patient will verbalize basic understanding of  CHF, DMII, and CKD Stage 3A disease process and self health management plan as evidenced by verbal explanation, recognizing symptoms, lifestyle modifications attend all scheduled medical appointments: with primary care provider and specialist as evidenced by keeping all scheduled appointments demonstrate Improved and Ongoing adherence to prescribed treatment plan for CHF, DMII, and CKD Stage 3A as evidenced by consistent medication compliance, symptom monitoring, continued lifestyle modifications continue to work with RN Care Manager to address care management and care coordination needs related to  CHF, DMII, and CKD Stage 3A as evidenced by adherence to CM Team Scheduled appointments work with pharmacist to address Financial constraints related to medication obtainment related toCHF, DMII, and CKD Stage 3A as evidenced by review or EMR and patient or pharmacist report work with Child psychotherapist to address  related to the management of Financial constraints related to insurance/medicaid application related to the management of CHF, DMII, and CKD Stage 3A as evidenced by review of EMR and patient or Child psychotherapist report through  collaboration with Medical illustrator, provider, and care team.   Interventions: Evaluation of current treatment plan related to  self management and patient's adherence to plan as established by provider   Heart Failure Interventions:  (Status:  Goal on track:  Yes.) Long Term Goal Basic overview and discussion of pathophysiology of Heart Failure reviewed. Denies any acute changes and reports that he is monitored on a regular basis. He states that when he becomes fluid overloaded he will take his lasix. Denies any swelling or shortness of breath at this time. Provided education on low sodium diet. Reports compliance with low sodium diet Reviewed Heart Failure Action Plan in depth and provided written copy Assessed need for readable accurate scales in home Provided education about placing scale on hard, flat surface Advised patient to weigh each morning after emptying bladder Discussed importance of daily weight and advised patient to weigh and record daily. Does not weigh daily, monitored remotely Reviewed role of diuretics in prevention of fluid overload and management of heart failure; Discussed the importance of keeping all appointments with provider Provided patient with education about the role of exercise in the management of heart failure Screening for signs and symptoms of depression related to chronic disease state  Assessed social determinant of health barriers    Chronic Kidney Disease Interventions:  (Status:  Goal on track:  Yes.) Long Term Goal Evaluation of current treatment plan related to chronic kidney disease self management and patient's adherence to plan as established by provider. Reports upcoming appointment with nephrologist soon. He states he was started on Vitamin D and will likely have lab work when he returns  Provided education to patient re: stroke prevention, s/s of heart attack and stroke. Education and support provided Reviewed medications with patient and  discussed importance of compliance. Reports compliance with all medications Counseled on adverse effects of illicit drug and excessive alcohol use in patients with chronic kidney disease. Denies any illicit drug or excessive alcohol use    Counseled on the importance of exercise goals with target of 150 minutes per week     Advised patient, providing education and rationale, to monitor blood pressure daily and record, calling PCP for findings outside established parameters    Discussed complications of poorly controlled blood pressure such as heart disease, stroke, circulatory complications, vision complications, kidney impairment, sexual dysfunction    Reviewed scheduled/upcoming provider appointments including    Advised patient to discuss new changes with provider    Discussed plans with patient for ongoing care management follow up and provided patient with direct contact information for care management team    Screening for signs and symptoms of depression related to chronic disease state      Discussed the impact of chronic kidney disease on daily life and mental health and acknowledged and normalized feelings of disempowerment, fear, and frustration    Assessed social determinant of health barriers    Last practice recorded BP readings:  BP Readings from Last 3 Encounters:  04/22/23 110/72  03/30/23 (!) 149/103  03/17/23 (!) 143/98   Most recent eGFR/CrCl:  Lab Results  Component Value Date   EGFR 44 (L) 04/22/2023    No components found for: "CRCL"    Diabetes Interventions:  (Status:  Goal on track:  Yes.) Long Term Goal Assessed patient's understanding of A1c goal: <7% Provided education to patient about basic DM disease process. Reports not checking his blood sugar because he is unable to see his number. A1C remains above goal. He does report that some days his appetite is not as good as other days. Reviewed medications with patient and discussed importance of medication  adherence. Reports compliance with all medications Counseled on importance of regular laboratory monitoring as prescribed Discussed plans with patient for ongoing care management follow up and provided patient with direct contact information for care management team Provided patient with written educational materials related to hypo and hyperglycemia and importance of correct treatment. Reports no signs or symptoms of hyper/hypoglycemia. Reviewed scheduled/upcoming provider appointments including:   Advised patient, providing education and rationale, to check cbg as recommended and record, calling provider for findings outside established parameters Referral made to pharmacy team for assistance with medication obtainment. Reports no current pharmacy needs Review of patient status, including review of consultants reports, relevant laboratory and other test results, and medications completed Screening for signs and symptoms of depression related to chronic disease state  Assessed social determinant of health barriers Lab Results  Component Value Date   HGBA1C 7.1 (A) 04/22/2023    Patient Goals/Self-Care Activities: Take all medications as prescribed Attend all scheduled provider appointments Call pharmacy for medication refills 3-7 days in advance of running out of medications Attend church or other social activities Perform all self care activities independently  Call provider office for new concerns or questions  Work with the social worker to address care coordination needs and will continue to work with the clinical team to address health care and disease management related needs call office if I gain more than 2 pounds in one day or 5 pounds in one week keep legs up while sitting watch for swelling in  feet, ankles and legs every day follow rescue plan if symptoms flare-up schedule appointment with eye doctor check feet daily for cuts, sores or redness enter blood sugar readings and  medication or insulin into daily log take the blood sugar log to all doctor visits  Follow Up Plan:  Telephone follow up appointment with care management team member scheduled for:  06-22-2023 at 2:30 pm           Our next appointment is by telephone on 06-22-2023 at 2:30 pm  Please call the care guide team at [phone] if you need to cancel or reschedule your appointment.   If you are experiencing a Mental Health or Behavioral Health Crisis or need someone to talk to, please call the Suicide and Crisis Lifeline: 988 call the [place] National Suicide Prevention Lifeline: (269) 837-8888 or TTY: 219-375-0363 TTY 423 844 1183) to talk to a trained counselor call 1-800-273-TALK (toll free, 24 hour hotline) go to [facility] [address], [place] [phone])   The patient verbalized understanding of instructions, educational materials, and care plan provided today and DECLINED offer to receive copy of patient instructions, educational materials, and care plan.   Telephone follow up appointment with care management team member scheduled for:06-22-2023 at 2:30 pm  Larey Brick, BSN RN [facility], [facility] Health RN Care Manager Direct Dial: [phone]  Fax: [phone]

## 2023-05-25 NOTE — Patient Outreach (Signed)
Care Management   Visit Note  05/25/2023 Name: [name] MRN: [id number] DOB: [birth year]  Subjective: [name] is a 63 y.o. year old male who is a primary care patient of Nestor Ramp, MD. The Care Management team was consulted for assistance.      Engaged with patient spoke with patient by telephone.    Goals Addressed             This Visit's Progress    RNCM Care Management Expected Outcomes: Monitor, Self-Manage and Reduce Symptoms of: DM, CHF, CKD       Current Barriers:  Knowledge Deficits related to plan of care for management of CHF, DMII, and CKD Stage 3A  Care Coordination needs related to Medicaid application assistance Chronic Disease Management support and education needs related to CHF, DMII, and CKD Stage 3A  Financial Constraints  Literacy barriers  RNCM Clinical Goal(s):  Patient will verbalize basic understanding of  CHF, DMII, and CKD Stage 3A disease process and self health management plan as evidenced by verbal explanation, recognizing symptoms, lifestyle modifications attend all scheduled medical appointments: with primary care provider and specialist as evidenced by keeping all scheduled appointments demonstrate Improved and Ongoing adherence to prescribed treatment plan for CHF, DMII, and CKD Stage 3A as evidenced by consistent medication compliance, symptom monitoring, continued lifestyle modifications continue to work with RN Care Manager to address care management and care coordination needs related to  CHF, DMII, and CKD Stage 3A as evidenced by adherence to CM Team Scheduled appointments work with pharmacist to address Financial constraints related to medication obtainment related toCHF, DMII, and CKD Stage 3A as evidenced by review or EMR and patient or pharmacist report work with Child psychotherapist to address  related to the management of Financial constraints related to insurance/medicaid application related to the management of CHF, DMII,  and CKD Stage 3A as evidenced by review of EMR and patient or Child psychotherapist report through collaboration with Medical illustrator, provider, and care team.   Interventions: Evaluation of current treatment plan related to  self management and patient's adherence to plan as established by provider   Heart Failure Interventions:  (Status:  Goal on track:  Yes.) Long Term Goal Basic overview and discussion of pathophysiology of Heart Failure reviewed. Denies any acute changes and reports that he is monitored on a regular basis. He states that when he becomes fluid overloaded he will take his lasix. Denies any swelling or shortness of breath at this time. Provided education on low sodium diet. Reports compliance with low sodium diet Reviewed Heart Failure Action Plan in depth and provided written copy Assessed need for readable accurate scales in home Provided education about placing scale on hard, flat surface Advised patient to weigh each morning after emptying bladder Discussed importance of daily weight and advised patient to weigh and record daily. Does not weigh daily, monitored remotely Reviewed role of diuretics in prevention of fluid overload and management of heart failure; Discussed the importance of keeping all appointments with provider Provided patient with education about the role of exercise in the management of heart failure Screening for signs and symptoms of depression related to chronic disease state  Assessed social determinant of health barriers    Chronic Kidney Disease Interventions:  (Status:  Goal on track:  Yes.) Long Term Goal Evaluation of current treatment plan related to chronic kidney disease self management and patient's adherence to plan as established by provider. Reports upcoming appointment with nephrologist soon.  He states he was started on Vitamin D and will likely have lab work when he returns      Provided education to patient re: stroke prevention, s/s of heart  attack and stroke. Education and support provided Reviewed medications with patient and discussed importance of compliance. Reports compliance with all medications Counseled on adverse effects of illicit drug and excessive alcohol use in patients with chronic kidney disease. Denies any illicit drug or excessive alcohol use    Counseled on the importance of exercise goals with target of 150 minutes per week     Advised patient, providing education and rationale, to monitor blood pressure daily and record, calling PCP for findings outside established parameters    Discussed complications of poorly controlled blood pressure such as heart disease, stroke, circulatory complications, vision complications, kidney impairment, sexual dysfunction    Reviewed scheduled/upcoming provider appointments including    Advised patient to discuss new changes with provider    Discussed plans with patient for ongoing care management follow up and provided patient with direct contact information for care management team    Screening for signs and symptoms of depression related to chronic disease state      Discussed the impact of chronic kidney disease on daily life and mental health and acknowledged and normalized feelings of disempowerment, fear, and frustration    Assessed social determinant of health barriers    Last practice recorded BP readings:  BP Readings from Last 3 Encounters:  04/22/23 110/72  03/30/23 (!) 149/103  03/17/23 (!) 143/98   Most recent eGFR/CrCl:  Lab Results  Component Value Date   EGFR 44 (L) 04/22/2023    No components found for: "CRCL"    Diabetes Interventions:  (Status:  Goal on track:  Yes.) Long Term Goal Assessed patient's understanding of A1c goal: <7% Provided education to patient about basic DM disease process. Reports not checking his blood sugar because he is unable to see his number. A1C remains above goal. He does report that some days his appetite is not as good as  other days. Reviewed medications with patient and discussed importance of medication adherence. Reports compliance with all medications Counseled on importance of regular laboratory monitoring as prescribed Discussed plans with patient for ongoing care management follow up and provided patient with direct contact information for care management team Provided patient with written educational materials related to hypo and hyperglycemia and importance of correct treatment. Reports no signs or symptoms of hyper/hypoglycemia. Reviewed scheduled/upcoming provider appointments including:   Advised patient, providing education and rationale, to check cbg as recommended and record, calling provider for findings outside established parameters Referral made to pharmacy team for assistance with medication obtainment. Reports no current pharmacy needs Review of patient status, including review of consultants reports, relevant laboratory and other test results, and medications completed Screening for signs and symptoms of depression related to chronic disease state  Assessed social determinant of health barriers Lab Results  Component Value Date   HGBA1C 7.1 (A) 04/22/2023    Patient Goals/Self-Care Activities: Take all medications as prescribed Attend all scheduled provider appointments Call pharmacy for medication refills 3-7 days in advance of running out of medications Attend church or other social activities Perform all self care activities independently  Call provider office for new concerns or questions  Work with the social worker to address care coordination needs and will continue to work with the clinical team to address health care and disease management related needs call office if I gain  more than 2 pounds in one day or 5 pounds in one week keep legs up while sitting watch for swelling in feet, ankles and legs every day follow rescue plan if symptoms flare-up schedule appointment with eye  doctor check feet daily for cuts, sores or redness enter blood sugar readings and medication or insulin into daily log take the blood sugar log to all doctor visits  Follow Up Plan:  Telephone follow up appointment with care management team member scheduled for:  06-22-2023 at 2:30 pm             Consent to Services:  Patient was given information about care management services, agreed to services, and gave verbal consent to participate.   Plan: Telephone follow up appointment with care management team member scheduled for:06-22-2023 at 2:30 pm  Larey Brick, BSN RN [facility], [facility] RN Care Manager Direct Dial: [phone]  Fax: [phone]

## 2023-05-26 ENCOUNTER — Ambulatory Visit: Payer: Self-pay | Admitting: Licensed Clinical Social Worker

## 2023-05-26 NOTE — Patient Outreach (Signed)
  Care Coordination   Follow Up Visit Note   05/26/2023 Name: [name] MRN: [id number] DOB: Jan 17, 1961  [name] is a 63 y.o. year old male who sees Nestor Ramp, MD for primary care. I spoke with  [name] by phone today.  What matters to the patients health and wellness today?  Pt reports switching to new insurance that assist with medication becoming more affordable     Goals Addressed             This Visit's Progress    COMPLETED: Care Coordination       Pt will contact [employer] and [facility] Dept to apply to prescription assistance.  Ms. Londo will engage in self care activities to reduce stress LCSW A Felton Clinton mailed second medassist application to pt.           SDOH assessments and interventions completed:  Yes  SDOH Interventions Today    Flowsheet Row Most Recent Value  SDOH Interventions   Food Insecurity Interventions Intervention Not Indicated  Housing Interventions Intervention Not Indicated  Transportation Interventions Intervention Not Indicated  Utilities Interventions Intervention Not Indicated        Care Coordination Interventions:  Yes, provided  Interventions Today    Flowsheet Row Most Recent Value  General Interventions   General Interventions Discussed/Reviewed Community Resources  [facility] Medicaid application mailed]       Follow up plan: No further intervention required.   Encounter Outcome:  Patient Visit Completed   Gwyndolyn Saxon MSW, LCSW Licensed Clinical Social Worker  [facility], Population Health Direct Dial: [phone]  Fax: [phone]

## 2023-05-26 NOTE — Patient Instructions (Signed)
Visit Information  Thank you for taking time to visit with me today. Please don't hesitate to contact me if I can be of assistance to you.   Following are the goals we discussed today:   Goals Addressed             This Visit's Progress    COMPLETED: Care Coordination       Pt will contact [employer] and [facility] Dept to apply to prescription assistance.  Ms. Staten will engage in self care activities to reduce stress LCSW A Felton Clinton mailed second medassist application to pt.            Please call the care guide team at [phone] if you need to cancel or reschedule your appointment.   If you are experiencing a Mental Health or Behavioral Health Crisis or need someone to talk to, please call 911   Patient verbalizes understanding of instructions and care plan provided today and agrees to view in MyChart. Active MyChart status and patient understanding of how to access instructions and care plan via MyChart confirmed with patient.     The patient has been provided with contact information for the care management team and has been advised to call with any health related questions or concerns.   [name] MSW, LCSW Licensed Clinical Social Worker  [facility], Population Health Direct Dial: [phone]  Fax: [phone]

## 2023-05-29 ENCOUNTER — Ambulatory Visit (INDEPENDENT_AMBULATORY_CARE_PROVIDER_SITE_OTHER): Payer: Medicare Other

## 2023-05-29 DIAGNOSIS — I428 Other cardiomyopathies: Secondary | ICD-10-CM

## 2023-05-29 LAB — CUP PACEART REMOTE DEVICE CHECK
Battery Remaining Longevity: 39 mo
Battery Voltage: 2.97 V
Brady Statistic RV Percent Paced: 0.05 %
Date Time Interrogation Session: 20250221091804
HighPow Impedance: 45 Ohm
HighPow Impedance: 53 Ohm
Implantable Lead Connection Status: 753985
Implantable Lead Implant Date: 20081114
Implantable Lead Location: 753860
Implantable Lead Model: 6947
Implantable Pulse Generator Implant Date: 20170222
Lead Channel Impedance Value: 342 Ohm
Lead Channel Impedance Value: 399 Ohm
Lead Channel Pacing Threshold Amplitude: 0.625 V
Lead Channel Pacing Threshold Pulse Width: 0.4 ms
Lead Channel Sensing Intrinsic Amplitude: 7 mV
Lead Channel Sensing Intrinsic Amplitude: 7 mV
Lead Channel Setting Pacing Amplitude: 2.5 V
Lead Channel Setting Pacing Pulse Width: 0.4 ms
Lead Channel Setting Sensing Sensitivity: 0.3 mV
Zone Setting Status: 755011

## 2023-06-02 ENCOUNTER — Other Ambulatory Visit: Payer: Self-pay

## 2023-06-02 MED ORDER — INSULIN GLARGINE-YFGN 100 UNIT/ML ~~LOC~~ SOPN: 25.0000 [IU] | PEN_INJECTOR | Freq: Every day | SUBCUTANEOUS | 0 refills | Status: DC

## 2023-06-05 ENCOUNTER — Other Ambulatory Visit: Payer: Self-pay

## 2023-06-05 DIAGNOSIS — G8929 Other chronic pain: Secondary | ICD-10-CM

## 2023-06-05 DIAGNOSIS — M170 Bilateral primary osteoarthritis of knee: Secondary | ICD-10-CM

## 2023-06-08 ENCOUNTER — Ambulatory Visit: Payer: Medicare Other | Attending: Internal Medicine

## 2023-06-08 DIAGNOSIS — I5022 Chronic systolic (congestive) heart failure: Secondary | ICD-10-CM

## 2023-06-08 DIAGNOSIS — Z95810 Presence of automatic (implantable) cardiac defibrillator: Secondary | ICD-10-CM | POA: Diagnosis not present

## 2023-06-08 MED ORDER — HYDROCODONE-ACETAMINOPHEN 5-325 MG PO TABS: ORAL_TABLET | ORAL | 0 refills | Status: DC

## 2023-06-08 NOTE — Telephone Encounter (Signed)
 Patient's wife returns call to nurse line regarding rx refill.   She states that rx is due by 06/10/23.  Veronda Prude, RN

## 2023-06-11 DIAGNOSIS — E1129 Type 2 diabetes mellitus with other diabetic kidney complication: Secondary | ICD-10-CM | POA: Diagnosis not present

## 2023-06-11 DIAGNOSIS — R809 Proteinuria, unspecified: Secondary | ICD-10-CM | POA: Diagnosis not present

## 2023-06-11 DIAGNOSIS — N2581 Secondary hyperparathyroidism of renal origin: Secondary | ICD-10-CM | POA: Diagnosis not present

## 2023-06-11 DIAGNOSIS — M1A9XX0 Chronic gout, unspecified, without tophus (tophi): Secondary | ICD-10-CM | POA: Diagnosis not present

## 2023-06-11 DIAGNOSIS — N1832 Chronic kidney disease, stage 3b: Secondary | ICD-10-CM | POA: Diagnosis not present

## 2023-06-11 DIAGNOSIS — E1122 Type 2 diabetes mellitus with diabetic chronic kidney disease: Secondary | ICD-10-CM | POA: Diagnosis not present

## 2023-06-11 DIAGNOSIS — I129 Hypertensive chronic kidney disease with stage 1 through stage 4 chronic kidney disease, or unspecified chronic kidney disease: Secondary | ICD-10-CM | POA: Diagnosis not present

## 2023-06-11 DIAGNOSIS — I5022 Chronic systolic (congestive) heart failure: Secondary | ICD-10-CM | POA: Diagnosis not present

## 2023-06-12 LAB — LAB REPORT - SCANNED
Creatinine, POC: 99.2 mg/dL
EGFR: 47

## 2023-06-12 NOTE — Progress Notes (Signed)
 EPIC Encounter for ICM Monitoring  Patient Name: [name] is a 63 y.o. male Date: 06/12/2023 Primary Care Physican: Nestor Ramp, MD Primary Cardiologist: Sharyn Lull  Electrophysiologist: Graciela Husbands Nephrologist: [facility] 04/22/2023 Office Weight: 322 lbs                                                           Spoke with patient and heart failure questions reviewed.  Transmission results reviewed.  Pt asymptomatic for fluid accumulation.  He visited [facility] 3/6 and was told he was holding fluid in stomach area and Lasix adjusted.    Optivol thoracic impedance suggesting intermittent days with possible fluid accumulation within the last month.   Prescribed:  Furosemide 20 mg take 2 tablet(s) (40 mg total) by mouth every Mon, Wed, and Friday (prescribed [facility] 06/11/2023)   Labs: 04/22/2023 Creatinine 1.72, BUN 19, Potassium 4.5, Sodium 145, GFR 44 A complete set of results can be found in Results Review.   Recommendations:  Encouraged to limit salt intake and fluid to 64 oz daily.  No changes and encouraged to call if experiencing any fluid symptoms.   Follow-up plan: ICM clinic phone appointment on 07/13/2023.   91 day device clinic remote transmission 08/28/2023.     EP/Cardiology Office Visits:  Recall 08/20/2023 with Dr. Graciela Husbands.     Copy of ICM check sent to Dr. Graciela Husbands.   3 month ICM trend: 06/08/2023.    12-14 Month ICM trend:     Karie Soda, RN 06/12/2023 4:00 PM

## 2023-06-15 ENCOUNTER — Other Ambulatory Visit: Payer: Self-pay

## 2023-06-15 DIAGNOSIS — S39012A Strain of muscle, fascia and tendon of lower back, initial encounter: Secondary | ICD-10-CM

## 2023-06-17 ENCOUNTER — Ambulatory Visit (INDEPENDENT_AMBULATORY_CARE_PROVIDER_SITE_OTHER): Payer: 59 | Admitting: Family Medicine

## 2023-06-17 ENCOUNTER — Ambulatory Visit: Payer: 59 | Admitting: Family Medicine

## 2023-06-17 ENCOUNTER — Encounter: Payer: Self-pay | Admitting: Family Medicine

## 2023-06-17 VITALS — BP 123/82 | HR 95 | Ht 62.0 in | Wt 316.0 lb

## 2023-06-17 DIAGNOSIS — S39012A Strain of muscle, fascia and tendon of lower back, initial encounter: Secondary | ICD-10-CM

## 2023-06-17 DIAGNOSIS — E1122 Type 2 diabetes mellitus with diabetic chronic kidney disease: Secondary | ICD-10-CM | POA: Diagnosis not present

## 2023-06-17 DIAGNOSIS — N184 Chronic kidney disease, stage 4 (severe): Secondary | ICD-10-CM | POA: Diagnosis not present

## 2023-06-17 DIAGNOSIS — R059 Cough, unspecified: Secondary | ICD-10-CM

## 2023-06-17 DIAGNOSIS — I10 Essential (primary) hypertension: Secondary | ICD-10-CM

## 2023-06-17 MED ORDER — IPRATROPIUM BROMIDE 0.03 % NA SOLN: 2.0000 | Freq: Two times a day (BID) | NASAL | 12 refills | Status: DC

## 2023-06-17 MED ORDER — CYCLOBENZAPRINE HCL 5 MG PO TABS: 5.0000 mg | ORAL_TABLET | Freq: Every evening | ORAL | 1 refills | Status: DC | PRN

## 2023-06-17 NOTE — Progress Notes (Signed)
    SUBJECTIVE:   CHIEF COMPLAINT / HPI:   HTN On Coreg, Entresto  Requesting refill on atrovent nasal spray as well as flexeril Using flexeril for occasional back pain  Taking insulin 25 u daily but has not been checking sugars Pt is blind so has difficulty with this His son is sometimes able to help No dizziness, lightheadedness, symptoms of hypoglycemia   PERTINENT  PMH / PSH: Blindness, PAF, hypertension, CHF, diabetes, CKD  OBJECTIVE:   BP 123/82   Pulse 95   Ht 5\' 2"  (1.575 m)   Wt (!) 316 lb (143.3 kg)   SpO2 95%   BMI 57.80 kg/m   General: NAD, pleasant Respiratory: No respiratory distress Skin: warm and dry, no rashes noted Psych: Normal affect and mood  ASSESSMENT/PLAN:   Assessment & Plan Type 2 diabetes mellitus with stage 4 chronic kidney disease, unspecified whether long term insulin use (HCC) Would like for patient to set up an appointment with Dr. Raymondo Band for CGM as patient is blind and unable to check his sugars at home.  Continue current regimen for now.  Patient stated he would call the office to schedule this appointment. HYPERTENSION, BENIGN Continue current regimen, well-controlled.   Vonna Drafts, MD [facility]

## 2023-06-17 NOTE — Assessment & Plan Note (Signed)
 Would like for patient to set up an appointment with Dr. Raymondo Band for CGM as patient is blind and unable to check his sugars at home.  Continue current regimen for now.  Patient stated he would call the office to schedule this appointment.

## 2023-06-17 NOTE — Assessment & Plan Note (Signed)
Continue current regimen , well controlled

## 2023-06-22 ENCOUNTER — Other Ambulatory Visit: Payer: Self-pay | Admitting: *Deleted

## 2023-06-22 NOTE — Patient Instructions (Signed)
 Visit Information  Thank you for taking time to visit with me today. Please don't hesitate to contact me if I can be of assistance to you before our next scheduled telephone appointment.  Following are the goals we discussed today:   Goals Addressed             This Visit's Progress    RNCM Care Management Expected Outcomes: Monitor, Self-Manage and Reduce Symptoms of: DM, CHF, CKD       Current Barriers:  Knowledge Deficits related to plan of care for management of CHF, DMII, and CKD Stage 3A  Care Coordination needs related to Medicaid application assistance Chronic Disease Management support and education needs related to CHF, DMII, and CKD Stage 3A  Financial Constraints  Literacy barriers  RNCM Clinical Goal(s):  Patient will verbalize basic understanding of  CHF, DMII, and CKD Stage 3A disease process and self health management plan as evidenced by verbal explanation, recognizing symptoms, lifestyle modifications attend all scheduled medical appointments: with primary care provider and specialist as evidenced by keeping all scheduled appointments demonstrate Improved and Ongoing adherence to prescribed treatment plan for CHF, DMII, and CKD Stage 3A as evidenced by consistent medication compliance, symptom monitoring, continued lifestyle modifications continue to work with RN Care Manager to address care management and care coordination needs related to  CHF, DMII, and CKD Stage 3A as evidenced by adherence to CM Team Scheduled appointments work with pharmacist to address Financial constraints related to medication obtainment related toCHF, DMII, and CKD Stage 3A as evidenced by review or EMR and patient or pharmacist report work with Child psychotherapist to address  related to the management of Financial constraints related to insurance/medicaid application related to the management of CHF, DMII, and CKD Stage 3A as evidenced by review of EMR and patient or Child psychotherapist report through  collaboration with Medical illustrator, provider, and care team.   Interventions: Evaluation of current treatment plan related to  self management and patient's adherence to plan as established by provider   Heart Failure Interventions:  (Status:  Goal on track:  Yes.) Long Term Goal Basic overview and discussion of pathophysiology of Heart Failure reviewed. Denies any acute changes and reports that he is monitored on a regular basis.  Denies any swelling or shortness of breath at this time. Provided education on low sodium diet. Reports compliance with low sodium diet Reviewed Heart Failure Action Plan in depth and provided written copy Assessed need for readable accurate scales in home Provided education about placing scale on hard, flat surface Advised patient to weigh each morning after emptying bladder Discussed importance of daily weight and advised patient to weigh and record daily. Does not weigh daily, monitored remotely Reviewed role of diuretics in prevention of fluid overload and management of heart failure; Discussed the importance of keeping all appointments with provider Provided patient with education about the role of exercise in the management of heart failure Screening for signs and symptoms of depression related to chronic disease state  Assessed social determinant of health barriers    Chronic Kidney Disease Interventions:  (Status:  Goal on track:  Yes.) Long Term Goal Evaluation of current treatment plan related to chronic kidney disease self management and patient's adherence to plan as established by provider. Reports that he was started on Vit D but unable to very dosage. Nephrology dosage in June Provided education to patient re: stroke prevention, s/s of heart attack and stroke. Education and support provided Reviewed medications with patient  and discussed importance of compliance. Reports compliance with all medications Counseled on adverse effects of illicit drug and  excessive alcohol use in patients with chronic kidney disease. Denies any illicit drug or excessive alcohol use    Counseled on the importance of exercise goals with target of 150 minutes per week     Advised patient, providing education and rationale, to monitor blood pressure daily and record, calling PCP for findings outside established parameters    Discussed complications of poorly controlled blood pressure such as heart disease, stroke, circulatory complications, vision complications, kidney impairment, sexual dysfunction    Reviewed scheduled/upcoming provider appointments including   June 2025 Advised patient to discuss new changes with provider    Discussed plans with patient for ongoing care management follow up and provided patient with direct contact information for care management team    Screening for signs and symptoms of depression related to chronic disease state      Discussed the impact of chronic kidney disease on daily life and mental health and acknowledged and normalized feelings of disempowerment, fear, and frustration    Assessed social determinant of health barriers    Last practice recorded BP readings:  BP Readings from Last 3 Encounters:  06/17/23 123/82  04/22/23 110/72  03/30/23 (!) 149/103   Most recent eGFR/CrCl:  Lab Results  Component Value Date   EGFR 47.0 06/12/2023    No components found for: "CRCL"    Diabetes Interventions:  (Status:  Goal on track:  Yes.) Long Term Goal Assessed patient's understanding of A1c goal: <7% Provided education to patient about basic DM disease process. Reports not checking his blood sugar because he is unable to see his number.  Reviewed medications with patient and discussed importance of medication adherence. Reports compliance with all medications Counseled on importance of regular laboratory monitoring as prescribed Discussed plans with patient for ongoing care management follow up and provided patient with direct  contact information for care management team Provided patient with written educational materials related to hypo and hyperglycemia and importance of correct treatment. Reports no signs or symptoms of hyper/hypoglycemia. Reviewed scheduled/upcoming provider appointments including:   Advised patient, providing education and rationale, to check cbg as recommended and record, calling provider for findings outside established parameters Referral made to pharmacy team for assistance with medication obtainment. Reports no current pharmacy needs Review of patient status, including review of consultants reports, relevant laboratory and other test results, and medications completed Screening for signs and symptoms of depression related to chronic disease state  Assessed social determinant of health barriers Lab Results  Component Value Date   HGBA1C 7.1 (A) 04/22/2023    Patient Goals/Self-Care Activities: Take all medications as prescribed Attend all scheduled provider appointments Call pharmacy for medication refills 3-7 days in advance of running out of medications Attend church or other social activities Perform all self care activities independently  Call provider office for new concerns or questions  Work with the social worker to address care coordination needs and will continue to work with the clinical team to address health care and disease management related needs call office if I gain more than 2 pounds in one day or 5 pounds in one week keep legs up while sitting watch for swelling in feet, ankles and legs every day follow rescue plan if symptoms flare-up schedule appointment with eye doctor check feet daily for cuts, sores or redness enter blood sugar readings and medication or insulin into daily log take the blood sugar  log to all doctor visits  Follow Up Plan:  Telephone follow up appointment with care management team member scheduled for:  07-23-2023 at 1:00 pm           Our  next appointment is by telephone on 07-23-2023 at 1:00 pm  Please call the care guide team at [phone] if you need to cancel or reschedule your appointment.   If you are experiencing a Mental Health or Behavioral Health Crisis or need someone to talk to, please call the Suicide and Crisis Lifeline: 988 call the [place] National Suicide Prevention Lifeline: 757-454-3707 or TTY: 706-748-8739 TTY (640) 867-5302) to talk to a trained counselor call 1-800-273-TALK (toll free, 24 hour hotline) go to [facility] [address] [place] [phone])   Patient verbalizes understanding of instructions and care plan provided today and agrees to view in MyChart. Active MyChart status and patient understanding of how to access instructions and care plan via MyChart confirmed with patient.     Telephone follow up appointment with care management team member scheduled for:07-23-2023 at 1:00 pm  Larey Brick, BSN RN [facility], [facility] RN Care Manager Direct Dial: [phone]  Fax: [phone]

## 2023-06-22 NOTE — Patient Outreach (Signed)
 Care Management   Visit Note  06/22/2023 Name: [name] MRN: [id number] DOB: 1960/11/07  Subjective: [name] is a 63 y.o. year old male who is a primary care patient of Nestor Ramp, MD. The Care Management team was consulted for assistance.      Engaged with patient spoke with patient by telephone.    Goals Addressed             This Visit's Progress    RNCM Care Management Expected Outcomes: Monitor, Self-Manage and Reduce Symptoms of: DM, CHF, CKD       Current Barriers:  Knowledge Deficits related to plan of care for management of CHF, DMII, and CKD Stage 3A  Care Coordination needs related to Medicaid application assistance Chronic Disease Management support and education needs related to CHF, DMII, and CKD Stage 3A  Financial Constraints  Literacy barriers  RNCM Clinical Goal(s):  Patient will verbalize basic understanding of  CHF, DMII, and CKD Stage 3A disease process and self health management plan as evidenced by verbal explanation, recognizing symptoms, lifestyle modifications attend all scheduled medical appointments: with primary care provider and specialist as evidenced by keeping all scheduled appointments demonstrate Improved and Ongoing adherence to prescribed treatment plan for CHF, DMII, and CKD Stage 3A as evidenced by consistent medication compliance, symptom monitoring, continued lifestyle modifications continue to work with RN Care Manager to address care management and care coordination needs related to  CHF, DMII, and CKD Stage 3A as evidenced by adherence to CM Team Scheduled appointments work with pharmacist to address Financial constraints related to medication obtainment related toCHF, DMII, and CKD Stage 3A as evidenced by review or EMR and patient or pharmacist report work with Child psychotherapist to address  related to the management of Financial constraints related to insurance/medicaid application related to the management of CHF, DMII,  and CKD Stage 3A as evidenced by review of EMR and patient or Child psychotherapist report through collaboration with Medical illustrator, provider, and care team.   Interventions: Evaluation of current treatment plan related to  self management and patient's adherence to plan as established by provider   Heart Failure Interventions:  (Status:  Goal on track:  Yes.) Long Term Goal Basic overview and discussion of pathophysiology of Heart Failure reviewed. Denies any acute changes and reports that he is monitored on a regular basis.  Denies any swelling or shortness of breath at this time. Provided education on low sodium diet. Reports compliance with low sodium diet Reviewed Heart Failure Action Plan in depth and provided written copy Assessed need for readable accurate scales in home Provided education about placing scale on hard, flat surface Advised patient to weigh each morning after emptying bladder Discussed importance of daily weight and advised patient to weigh and record daily. Does not weigh daily, monitored remotely Reviewed role of diuretics in prevention of fluid overload and management of heart failure; Discussed the importance of keeping all appointments with provider Provided patient with education about the role of exercise in the management of heart failure Screening for signs and symptoms of depression related to chronic disease state  Assessed social determinant of health barriers    Chronic Kidney Disease Interventions:  (Status:  Goal on track:  Yes.) Long Term Goal Evaluation of current treatment plan related to chronic kidney disease self management and patient's adherence to plan as established by provider. Reports that he was started on Vit D but unable to very dosage. Nephrology dosage in June Provided  education to patient re: stroke prevention, s/s of heart attack and stroke. Education and support provided Reviewed medications with patient and discussed importance of  compliance. Reports compliance with all medications Counseled on adverse effects of illicit drug and excessive alcohol use in patients with chronic kidney disease. Denies any illicit drug or excessive alcohol use    Counseled on the importance of exercise goals with target of 150 minutes per week     Advised patient, providing education and rationale, to monitor blood pressure daily and record, calling PCP for findings outside established parameters    Discussed complications of poorly controlled blood pressure such as heart disease, stroke, circulatory complications, vision complications, kidney impairment, sexual dysfunction    Reviewed scheduled/upcoming provider appointments including   June 2025 Advised patient to discuss new changes with provider    Discussed plans with patient for ongoing care management follow up and provided patient with direct contact information for care management team    Screening for signs and symptoms of depression related to chronic disease state      Discussed the impact of chronic kidney disease on daily life and mental health and acknowledged and normalized feelings of disempowerment, fear, and frustration    Assessed social determinant of health barriers    Last practice recorded BP readings:  BP Readings from Last 3 Encounters:  06/17/23 123/82  04/22/23 110/72  03/30/23 (!) 149/103   Most recent eGFR/CrCl:  Lab Results  Component Value Date   EGFR 47.0 06/12/2023    No components found for: "CRCL"    Diabetes Interventions:  (Status:  Goal on track:  Yes.) Long Term Goal Assessed patient's understanding of A1c goal: <7% Provided education to patient about basic DM disease process. Reports not checking his blood sugar because he is unable to see his number.  Reviewed medications with patient and discussed importance of medication adherence. Reports compliance with all medications Counseled on importance of regular laboratory monitoring as  prescribed Discussed plans with patient for ongoing care management follow up and provided patient with direct contact information for care management team Provided patient with written educational materials related to hypo and hyperglycemia and importance of correct treatment. Reports no signs or symptoms of hyper/hypoglycemia. Reviewed scheduled/upcoming provider appointments including:   Advised patient, providing education and rationale, to check cbg as recommended and record, calling provider for findings outside established parameters Referral made to pharmacy team for assistance with medication obtainment. Reports no current pharmacy needs Review of patient status, including review of consultants reports, relevant laboratory and other test results, and medications completed Screening for signs and symptoms of depression related to chronic disease state  Assessed social determinant of health barriers Lab Results  Component Value Date   HGBA1C 7.1 (A) 04/22/2023    Patient Goals/Self-Care Activities: Take all medications as prescribed Attend all scheduled provider appointments Call pharmacy for medication refills 3-7 days in advance of running out of medications Attend church or other social activities Perform all self care activities independently  Call provider office for new concerns or questions  Work with the social worker to address care coordination needs and will continue to work with the clinical team to address health care and disease management related needs call office if I gain more than 2 pounds in one day or 5 pounds in one week keep legs up while sitting watch for swelling in feet, ankles and legs every day follow rescue plan if symptoms flare-up schedule appointment with eye doctor check feet daily  for cuts, sores or redness enter blood sugar readings and medication or insulin into daily log take the blood sugar log to all doctor visits  Follow Up Plan:  Telephone  follow up appointment with care management team member scheduled for:  07-23-2023 at 1:00 pm           Consent to Services:  Patient was given information about care management services, agreed to services, and gave verbal consent to participate.   Plan: Telephone follow up appointment with care management team member scheduled for:07-23-2023 at 1:00 pm Larey Brick, BSN RN [facility], [facility] RN Care Manager Direct Dial: [phone]  Fax: [phone]

## 2023-06-23 ENCOUNTER — Emergency Department (HOSPITAL_COMMUNITY)

## 2023-06-23 ENCOUNTER — Emergency Department (HOSPITAL_COMMUNITY)
Admission: EM | Admit: 2023-06-23 | Discharge: 2023-06-23 | Disposition: A | Discharge status: Home / Self Care | Attending: Emergency Medicine | Admitting: Emergency Medicine

## 2023-06-23 ENCOUNTER — Encounter (HOSPITAL_COMMUNITY): Payer: Self-pay

## 2023-06-23 ENCOUNTER — Other Ambulatory Visit: Payer: Self-pay

## 2023-06-23 DIAGNOSIS — H5316 Psychophysical visual disturbances: Secondary | ICD-10-CM | POA: Diagnosis not present

## 2023-06-23 DIAGNOSIS — R4182 Altered mental status, unspecified: Secondary | ICD-10-CM | POA: Diagnosis present

## 2023-06-23 DIAGNOSIS — Z79899 Other long term (current) drug therapy: Secondary | ICD-10-CM | POA: Diagnosis not present

## 2023-06-23 DIAGNOSIS — N189 Chronic kidney disease, unspecified: Secondary | ICD-10-CM | POA: Insufficient documentation

## 2023-06-23 DIAGNOSIS — R441 Visual hallucinations: Secondary | ICD-10-CM

## 2023-06-23 DIAGNOSIS — Z794 Long term (current) use of insulin: Secondary | ICD-10-CM | POA: Diagnosis not present

## 2023-06-23 DIAGNOSIS — I129 Hypertensive chronic kidney disease with stage 1 through stage 4 chronic kidney disease, or unspecified chronic kidney disease: Secondary | ICD-10-CM | POA: Insufficient documentation

## 2023-06-23 DIAGNOSIS — R531 Weakness: Secondary | ICD-10-CM | POA: Diagnosis not present

## 2023-06-23 DIAGNOSIS — Z7901 Long term (current) use of anticoagulants: Secondary | ICD-10-CM | POA: Diagnosis not present

## 2023-06-23 DIAGNOSIS — E1122 Type 2 diabetes mellitus with diabetic chronic kidney disease: Secondary | ICD-10-CM | POA: Diagnosis not present

## 2023-06-23 DIAGNOSIS — Z7984 Long term (current) use of oral hypoglycemic drugs: Secondary | ICD-10-CM | POA: Diagnosis not present

## 2023-06-23 LAB — COMPREHENSIVE METABOLIC PANEL
ALT: 9 U/L (ref 0–44)
AST: 13 U/L — ABNORMAL LOW (ref 15–41)
Albumin: 3.4 g/dL — ABNORMAL LOW (ref 3.5–5.0)
Alkaline Phosphatase: 48 U/L (ref 38–126)
Anion gap: 10 (ref 5–15)
BUN: 12 mg/dL (ref 8–23)
CO2: 23 mmol/L (ref 22–32)
Calcium: 9.1 mg/dL (ref 8.9–10.3)
Chloride: 109 mmol/L (ref 98–111)
Creatinine, Ser: 1.54 mg/dL — ABNORMAL HIGH (ref 0.61–1.24)
GFR, Estimated: 50 mL/min — ABNORMAL LOW (ref >60)
Glucose, Bld: 157 mg/dL — ABNORMAL HIGH (ref 70–99)
Potassium: 4 mmol/L (ref 3.5–5.1)
Sodium: 142 mmol/L (ref 135–145)
Total Bilirubin: 0.7 mg/dL (ref 0.0–1.2)
Total Protein: 6.9 g/dL (ref 6.5–8.1)

## 2023-06-23 LAB — CBC WITH DIFFERENTIAL/PLATELET
Abs Immature Granulocytes: 0.08 10*3/uL — ABNORMAL HIGH (ref 0.00–0.07)
Basophils Absolute: 0 10*3/uL (ref 0.0–0.1)
Basophils Relative: 0 %
Eosinophils Absolute: 0.1 10*3/uL (ref 0.0–0.5)
Eosinophils Relative: 1 %
HCT: 44.5 % (ref 39.0–52.0)
Hemoglobin: 15 g/dL (ref 13.0–17.0)
Immature Granulocytes: 1 %
Lymphocytes Relative: 19 %
Lymphs Abs: 1.8 10*3/uL (ref 0.7–4.0)
MCH: 29.7 pg (ref 26.0–34.0)
MCHC: 33.7 g/dL (ref 30.0–36.0)
MCV: 88.1 fL (ref 80.0–100.0)
Monocytes Absolute: 0.7 10*3/uL (ref 0.1–1.0)
Monocytes Relative: 8 %
Neutro Abs: 6.8 10*3/uL (ref 1.7–7.7)
Neutrophils Relative %: 71 %
Platelets: 203 10*3/uL (ref 150–400)
RBC: 5.05 MIL/uL (ref 4.22–5.81)
RDW: 14.4 % (ref 11.5–15.5)
WBC: 9.5 10*3/uL (ref 4.0–10.5)
nRBC: 0 % (ref 0.0–0.2)

## 2023-06-23 LAB — ETHANOL: Alcohol, Ethyl (B): <10 mg/dL (ref <10)

## 2023-06-23 MED ORDER — QUETIAPINE FUMARATE 300 MG PO TABS: 300.0000 mg | ORAL_TABLET | Freq: Every day | ORAL | 0 refills | Status: DC

## 2023-06-23 NOTE — ED Notes (Signed)
 Medications and belongings returned to pt. D/c instructions reviewed with pt and daughter.

## 2023-06-23 NOTE — ED Triage Notes (Signed)
 Pt arrived from home via [facility] c/o visual hallucinations. Pt has hx. Pt states that he thinks his medication needs to be adjusted again

## 2023-06-23 NOTE — ED Notes (Addendum)
 Blind, amiable, pleasant, NAD, calm, interactive, resps e/u, speaking in clearly. Denies substance abuse. Denies pain or nausea. States, "feel pretty good". Endorses visual hallucinations. Denies AH. Taking meds as prescribed. Has not had today's morning meds. "Believes med no longer working at current dose, and that he needs a med adjustment to prevent VH. Belongings in [facility] [address], med in pharmacy. See med receipt sheet.

## 2023-06-23 NOTE — ED Notes (Signed)
 Out in w/c with EMT, and daughter

## 2023-06-23 NOTE — ED Provider Notes (Signed)
 [place] EMERGENCY DEPARTMENT AT [facility] Provider Note   CSN: [id number] Arrival date & time: 06/23/23  0630     History  Chief Complaint  Patient presents with   Psychiatric Evaluation    [name] is a 63 y.o. male.  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  The patient reports that since going blind he has had visual hallucinations where he sees flashes of lights.  He states that this has been controlled with Seroquel and he had not had any hallucinations for about 3 years.  He states that over the last few days the hallucinations have started to increase and mostly occur at night preventing him from being able to get good sleep.  He states that he has still been taking his medications as prescribed.  He denies any eye pain.  He denies any headaches, numbness or weakness.  He denies any recent fevers or illnesses, nausea, vomiting or diarrhea.  He states that his primary doctor told him that they could increase his Seroquel if his hallucinations increased.  He states that he does have follow-up with ophthalmology next month.  He denies any audible hallucinations, command hallucinations, SI or HI.  The history is provided by the patient.       Home Medications Prior to Admission medications   Medication Sig Start Date End Date Taking? Authorizing Provider  QUEtiapine (SEROQUEL) 300 MG tablet Take 1 tablet (300 mg total) by mouth at bedtime. 06/23/23 07/23/23 Yes Kingsley, Turkey K, DO  allopurinol (ZYLOPRIM) 100 MG tablet TAKE 1/2 TABLET BY MOUTH EVERY DAY 05/13/23   Nestor Ramp, MD  brimonidine [facility]) 0.2 % ophthalmic solution Place 1 drop into both eyes 2 (two) times daily. 07/09/19   [provider]  carvedilol (COREG) 25 MG  tablet Take 1 tablet (25 mg total) by mouth 2 (two) times daily with a meal. 02/11/12   Nestor Ramp, MD  cetirizine (ZYRTEC ALLERGY) 10 MG tablet Take 1 tablet (10 mg total) by mouth daily. 09/03/22   Nestor Ramp, MD  cyclobenzaprine (FLEXERIL) 5 MG tablet Take 1 tablet (5 mg total) by mouth at bedtime as needed for muscle spasms. 06/17/23   Vonna Drafts, MD  dapagliflozin propanediol (FARXIGA) 10 MG TABS tablet Take 1 tablet (10 mg total) by mouth daily. 11/11/22   Duke Salvia, MD  diclofenac Sodium (VOLTAREN) 1 % GEL Apply 2 g topically 4 (four) times daily as needed (right knee pain). 03/17/23   Nestor Ramp, MD  dorzolamide-timolol (COSOPT) 22.3-6.8 MG/ML ophthalmic solution Place 1 drop into both eyes 2 (two) times daily. 08/12/19   [provider]  ELIQUIS 5 MG TABS tablet TAKE ONE TABLET BY MOUTH TWICE DAILY 04/16/23   Duke Salvia, MD  EPINEPHRINE 0.3 mg/0.3 mL IJ SOAJ injection Inject 0.3 mg into the muscle as needed for anaphylaxis. 10/10/20   Nestor Ramp, MD  Fluticasone Furoate (ARNUITY ELLIPTA) 100 MCG/ACT AEPB Inhale into lungs one per day Patient taking differently: Inhale 1 puff into the lungs daily. 04/16/23   Nestor Ramp, MD  furosemide (LASIX) 40 MG tablet Take 1 tablet (40 mg total) by mouth as directed. Strength: 40 mg Patient taking differently: Take 40 mg by mouth daily. 12/19/22   Nestor Ramp, MD  glimepiride (AMARYL) 4 MG tablet Take 4 mg by mouth daily. 04/16/23   [provider]  HYDROcodone-acetaminophen (NORCO/VICODIN) 5-325 MG tablet TAKE ONE TABLET BY MOUTH FOUR TIMES DAILY. MAY take ONE additional FOR a total of FIVE PER DAY Patient taking differently: Take 1 tablet by mouth every 6 (six) hours as needed for moderate pain (pain score 4-6) or severe pain (pain score 7-10). 06/08/23   Nestor Ramp, MD  insulin glargine-yfgn (SEMGLEE) 100 UNIT/ML Pen Inject 25 Units into the skin daily. 06/02/23 08/01/23  Nestor Ramp, MD  ipratropium (ATROVENT) 0.03 % nasal  spray Place 2 sprays into both nostrils every 12 (twelve) hours. 06/17/23   Vonna Drafts, MD  isosorbide mononitrate (ISMO) 20 MG tablet TAKE ONE TABLET BY MOUTH TWICE DAILY Patient taking differently: Take 20 mg by mouth 2 (two) times daily at 10 AM and 5 PM. 02/20/23   Nestor Ramp, MD  latanoprost (XALATAN) 0.005 % ophthalmic solution Place 1 drop into both eyes every evening. 08/12/19   [provider]  naloxone (NARCAN) nasal spray 4 mg/0.1 mL place ONE SPRAY into THE nostril AS NEEDED (accidential overdose) Patient taking differently: Place 1 spray into the nose once as needed (Accidental overdose). 05/15/23   Nestor Ramp, MD  neomycin-polymyxin b-dexamethasone (MAXITROL) 3.5-10000-0.1 OINT Place 1 Application into both eyes at bedtime. 06/15/23   [provider]  nitroGLYCERIN (NITROSTAT) 0.4 MG SL tablet Place 0.4 mg under the tongue every 5 (five) minutes as needed for chest pain. 01/24/19   [provider]  rosuvastatin (CRESTOR) 10 MG tablet TAKE ONE TABLET BY MOUTH EVERY DAY 01/09/23   Nestor Ramp, MD  sacubitril-valsartan (ENTRESTO) 49-51 MG Take 1 tablet by mouth 2 (two) times daily.    [provider]  Vitamin D, Ergocalciferol, (DRISDOL) 1.25 MG (50000 UNIT) CAPS capsule Take 50,000 Units by mouth once a week. 03/31/23   [provider]  lisinopril (PRINIVIL,ZESTRIL) 40 MG tablet Take 40 mg by mouth 2 (two) times daily.    06/17/11  [provider]      Allergies    Bee pollen, Shellfish allergy, and Nsaids    Review of Systems   Review of Systems  Physical Exam Updated Vital Signs BP (!) 161/125 (BP Location: Right Arm)   Pulse 90   Temp 97.9 F (36.6 C) (Oral)   Resp (!) 22   Ht 5\' 2"  (1.575 m)   Wt (!) 143.3 kg   SpO2 92%   BMI 57.78 kg/m  Physical Exam Vitals and nursing note reviewed.  Constitutional:      General: He is not in acute distress.    Appearance: Normal appearance. He is obese.  HENT:     Head:  Normocephalic and atraumatic.     Nose: Nose normal.     Mouth/Throat:     Mouth: Mucous membranes are moist.  Eyes:     Extraocular Movements: Extraocular movements intact.     Conjunctiva/sclera: Conjunctivae normal.     Comments: Chronic cloudy L pupil No vision bilaterally at baseline  Cardiovascular:     Rate and Rhythm: Normal rate and regular rhythm.     Heart sounds: Normal heart sounds.  Pulmonary:     Effort: Pulmonary effort is normal.     Breath sounds: Normal breath sounds.  Abdominal:     General: Abdomen is flat.     Palpations: Abdomen is soft.     Tenderness: There is no abdominal tenderness.  Musculoskeletal:        General: Normal range of motion.     Cervical back: Normal range of motion and neck supple.  Skin:    General: Skin is warm and dry.  Neurological:     Mental Status: He is alert and oriented to person, place, and time. Mental status is at baseline.     Sensory: No sensory deficit.     Motor: No weakness.  Psychiatric:        Mood and Affect: Mood normal.        Behavior: Behavior normal.        Thought Content: Thought content normal.        Judgment: Judgment normal.     ED Results / Procedures / Treatments   Labs (all labs ordered are listed, but only abnormal results are displayed) Labs Reviewed  COMPREHENSIVE METABOLIC PANEL - Abnormal; Notable for the following components:      Result Value   Glucose, Bld 157 (*)    Creatinine, Ser 1.54 (*)    Albumin 3.4 (*)    AST 13 (*)    GFR, Estimated 50 (*)    All other components within normal limits  CBC WITH DIFFERENTIAL/PLATELET - Abnormal; Notable for the following components:   Abs Immature Granulocytes 0.08 (*)    All other components within normal limits  ETHANOL    EKG None  Radiology CT Head Wo Contrast Result Date: 06/23/2023 CLINICAL DATA:  63 year old male with altered mental status. Visual hallucinations. EXAM: CT HEAD WITHOUT CONTRAST TECHNIQUE: Contiguous axial  images were obtained from the base of the skull through the vertex without intravenous contrast. RADIATION DOSE REDUCTION: This exam was performed according to the departmental dose-optimization program which includes automated exposure control, adjustment of the mA and/or kV according to patient size and/or use of iterative reconstruction technique. COMPARISON:  None Available. FINDINGS: Brain: No midline shift, ventriculomegaly, mass effect, evidence of mass lesion, intracranial hemorrhage or evidence of cortically based acute infarction. Patchy and confluent bilateral periventricular white matter hypodensity, moderate. Otherwise maintained gray-white differentiation throughout the brain. Vascular: Calcified atherosclerosis at the skull base. No suspicious intracranial vascular hyperdensity. Skull: Intact.  No acute osseous abnormality identified. Sinuses/Orbits: Visualized paranasal sinuses and mastoids are clear. Other: Postoperative changes to the right globe. Otherwise negative visualized orbit soft tissues. Visualized scalp soft tissues are within normal limits. IMPRESSION: 1. No acute intracranial abnormality. 2. Moderate cerebral white matter changes, nonspecific but most commonly due to chronic small vessel disease. Electronically Signed   By: Odessa Fleming M.D.   On: 06/23/2023 08:24    Procedures Procedures    Medications Ordered in ED Medications - No data to display  ED Course/ Medical Decision Making/ A&P Clinical Course as of 06/23/23 0919  Tue Jun 23, 2023  0829 No acute abnormality on [facility]. [VK]  0851 Cr at baseline, electrolytes within normal range. Normal WBC count and no infectious symptoms making infectious etiology unlikely and no history of substance abuse making this less likely. Patient will be given increase of his home seroquel and is otherwise stable for discharge home. Recommended close PCP follow up and to follow up with ophthalmology as scheduled. [VK]    Clinical Course User  Index [VK] Rexford Maus, DO                                 Medical Decision Making This patient presents to  the ED with chief complaint(s) of visual hallucinations with pertinent past medical history of glaucoma, legally blind with [name] syndrome, a fib on Eliquis, DM, HTN, CKD which further complicates the presenting complaint. The complaint involves an extensive differential diagnosis and also carries with it a high risk of complications and morbidity.    The differential diagnosis includes worsening hallucinations related to his [name] syndrome, electrolyte derangement, infection, no headaches or other focal neurologic deficits making ICH or mass effect unlikely, no audible hallucinations, SI or HI making psychosis unlikely, intoxication, substance use  Additional history obtained: Additional history obtained from N/A Records reviewed Primary Care Documents  ED Course and Reassessment: On patient's arrival he is hemodynamically stable in no acute distress without focal neurologic deficits.  Based on patient's history suspect worsening hallucinations in the setting of his visual impairment however with his significant comorbidities, will have labs and head CT performed to evaluate for alternative etiology.  No evidence of psychosis on exam.  Likely will need increase of his Seroquel.  Independent labs interpretation:  The following labs were independently interpreted: within normal range/at baseline  Independent visualization of imaging: - I independently visualized the following imaging with scope of interpretation limited to determining acute life threatening conditions related to emergency care: CTH, which revealed no acute disease  Consultation: - Consulted or discussed management/test interpretation w/ external professional: N/A  Consideration for admission or further workup: Patient has no emergent conditions requiring admission or further work-up at this  time and is stable for discharge home with primary care and ophtho follow-up  Social Determinants of health: N/A    Amount and/or Complexity of Data Reviewed Labs: ordered. Radiology: ordered.  Risk Prescription drug management.          Final Clinical Impression(s) / ED Diagnoses Final diagnoses:  Visual hallucinations  [name] syndrome    Rx / DC Orders ED Discharge Orders          Ordered    QUEtiapine (SEROQUEL) 300 MG tablet  Daily at bedtime        06/23/23 0918              Rexford Maus, DO 06/23/23 [phone]

## 2023-06-23 NOTE — ED Notes (Addendum)
 Pt. Belongings placed in locker #5. Pt. Changed into purple pants and large gown. Pt. Has cane at bedside for assistance ambulating due to him being blind.

## 2023-06-23 NOTE — ED Notes (Signed)
Pt. Wanded by security 

## 2023-06-23 NOTE — Discharge Instructions (Addendum)
 You were seen in the emergency department for your increased visual hallucinations.  Your CT scan and lab work were normal and is likely related to your known visual impairment.  I have sent a new prescription to increase the dose of your Seroquel and you can start taking the new dose to help with your hallucinations.  You should follow-up with your primary doctor in the next few days to make sure that your symptoms are improving with your dose adjustment and should follow-up with your eye doctor as scheduled.  You should return to the emergency department if you have auditory hallucinations and or any other new or concerning symptoms.

## 2023-06-29 ENCOUNTER — Telehealth: Payer: Self-pay

## 2023-06-29 ENCOUNTER — Telehealth: Payer: Self-pay | Admitting: Pharmacist

## 2023-06-29 NOTE — Telephone Encounter (Signed)
 Patient contacted for follow-up of complaint of difficult to use inhaler.   Since last contact patient reports throat symptoms with dry powder.  (Arnuity ellipta) States he would prefer a propellant type product.   Shared with him that I would contact PCP, Dr. Jennette Kettle and discuss options.    Total time with patient call and documentation of interaction: 9 minutes.

## 2023-06-29 NOTE — Telephone Encounter (Signed)
 Reviewed and agree with Dr Macky Lower plan.

## 2023-06-29 NOTE — Telephone Encounter (Signed)
 Patient's wife calls nurse line regarding issues with inhaler.   She reports that due to blindness, patient is unable to self administer this inhaler. When patient attempts to administer, the medication is "poofing into the air."   They are requesting that patient be prescribed a "generic type" inhaler.   Will forward to both Dr. Jennette Kettle and Dr. Raymondo Band for further advisement.   Veronda Prude, RN

## 2023-06-30 ENCOUNTER — Other Ambulatory Visit: Payer: Self-pay | Admitting: Family Medicine

## 2023-06-30 DIAGNOSIS — M25569 Pain in unspecified knee: Secondary | ICD-10-CM

## 2023-06-30 DIAGNOSIS — M170 Bilateral primary osteoarthritis of knee: Secondary | ICD-10-CM

## 2023-06-30 MED ORDER — MOMETASONE FURO-FORMOTEROL FUM 200-5 MCG/ACT IN AERO: 2.0000 | INHALATION_SPRAY | Freq: Every day | RESPIRATORY_TRACT | 12 refills | Status: AC

## 2023-06-30 MED ORDER — HYDROCODONE-ACETAMINOPHEN 5-325 MG PO TABS: ORAL_TABLET | ORAL | 0 refills | Status: DC

## 2023-06-30 NOTE — Progress Notes (Signed)
 Remote ICD transmission.

## 2023-06-30 NOTE — Telephone Encounter (Signed)
 Okay I checked on his formulary for 1 that is not a powder.  I will send in a new 1 and lets see if that works for him.  This 1 should be on his formulary as well.  Let me know if there are issues.  P_lease let his wife know. THANKS!  Denny Levy

## 2023-07-02 ENCOUNTER — Telehealth: Payer: Self-pay

## 2023-07-02 DIAGNOSIS — G8929 Other chronic pain: Secondary | ICD-10-CM

## 2023-07-02 DIAGNOSIS — M170 Bilateral primary osteoarthritis of knee: Secondary | ICD-10-CM

## 2023-07-02 MED ORDER — HYDROCODONE-ACETAMINOPHEN 5-325 MG PO TABS
ORAL_TABLET | ORAL | 0 refills | Status: DC
Start: 2023-07-02 — End: 2023-07-06

## 2023-07-02 NOTE — Telephone Encounter (Signed)
 Spoke to patient's wife and she states that the new RX will be delivered today at 1400.  She will let Dr. Jennette Kettle know if there are any issues.   Glennie Hawk, CMA

## 2023-07-02 NOTE — Telephone Encounter (Signed)
 [name] calls nurse line in regards to medications.   She reports they are no longer using [name] and have switched [name] to [name].   I called [name] and they are in the process of transferring all prescription to [name]. However, they can not transfer the [name].   I have cancelled this at [name]. Please resend to [name].   I have updated her pharmacy in epic.

## 2023-07-03 ENCOUNTER — Other Ambulatory Visit: Payer: Self-pay

## 2023-07-03 ENCOUNTER — Other Ambulatory Visit: Payer: Self-pay | Admitting: *Deleted

## 2023-07-03 DIAGNOSIS — M79672 Pain in left foot: Secondary | ICD-10-CM

## 2023-07-03 DIAGNOSIS — M109 Gout, unspecified: Secondary | ICD-10-CM

## 2023-07-03 DIAGNOSIS — I480 Paroxysmal atrial fibrillation: Secondary | ICD-10-CM

## 2023-07-03 MED ORDER — FUROSEMIDE 40 MG PO TABS: ORAL_TABLET | ORAL | 3 refills | Status: AC

## 2023-07-03 MED ORDER — EPINEPHRINE 0.3 MG/0.3ML IJ SOAJ: 0.3000 mg | INTRAMUSCULAR | 1 refills | Status: AC | PRN

## 2023-07-03 MED ORDER — BD PEN NEEDLE MICRO U/F 32G X 6 MM MISC: 1 refills | Status: DC

## 2023-07-03 MED ORDER — DICLOFENAC SODIUM 1 % EX GEL: 2.0000 g | Freq: Four times a day (QID) | CUTANEOUS | 2 refills | Status: DC | PRN

## 2023-07-03 MED ORDER — ALLOPURINOL 100 MG PO TABS: 50.0000 mg | ORAL_TABLET | Freq: Every day | ORAL | 3 refills | Status: DC

## 2023-07-03 MED ORDER — INSULIN GLARGINE-YFGN 100 UNIT/ML ~~LOC~~ SOPN: 25.0000 [IU] | PEN_INJECTOR | Freq: Every day | SUBCUTANEOUS | 0 refills | Status: DC

## 2023-07-03 MED ORDER — APIXABAN 5 MG PO TABS: 5.0000 mg | ORAL_TABLET | Freq: Two times a day (BID) | ORAL | 1 refills | Status: DC

## 2023-07-03 NOTE — Telephone Encounter (Signed)
 Eliquis 5mg  refill request received. Patient is 63 years old, weight-143.3kg, Crea-1.54 on 06/23/23, Diagnosis-Afib, and last seen by Francis Dowse on 08/25/22. Dose is appropriate based on dosing criteria. Will send in refill to requested pharmacy.

## 2023-07-06 ENCOUNTER — Telehealth: Payer: Self-pay

## 2023-07-06 DIAGNOSIS — G8929 Other chronic pain: Secondary | ICD-10-CM

## 2023-07-06 DIAGNOSIS — M170 Bilateral primary osteoarthritis of knee: Secondary | ICD-10-CM

## 2023-07-06 NOTE — Telephone Encounter (Signed)
 [name] calls nurse line regarding concerns for patient's mental state.   He has been having increased issues for the last several weeks with "his mind flipping out on him." Was seen in the ED on 06/23/23 and Seroquel was increased.   [name] reports that the police have been involved twice with patient due to him screaming and walking around outside and talking to himself. [name] reports that he lives with his son. States that patient is not making threats of harming himself or others. He will "just start saying thinks that do not make sense and is having issues with his memory." She says that he will "act right in front of the doctors" and then once back at home "will start acting out of it."   He refuses to go to [facility]. There are not guns in the home, however, patient is wearing a knife in a pouch that he "always has on him."  Patient reports that law enforcement advised that she reach out to PCP regarding these concerns for further advice or possibly starting process for IVC.   Advised patient that I would forward message to Dr. Jennette Kettle.   Precautions discussed.   Veronda Prude, RN

## 2023-07-06 NOTE — Telephone Encounter (Signed)
 Spoke with Dr. Jennette Kettle regarding patient.   Recommended that family contact local sheriff's department to begin IVC process.   [name] also reports issues with [facility] being out of stock on multiple prescriptions. They are changing pharmacy to [facility] on [address]. Called [facility] to cancel hydrocodone- acetaminophen prescription. Pharmacist reports that this had already been cancelled by family.   New rx will need to be sent to [facility]. Pended medication to this encounter.   I called [name] and advised to contact local sheriff's department to begin process for IVC. [name] voices understanding of plan.   Veronda Prude, RN

## 2023-07-07 MED ORDER — HYDROCODONE-ACETAMINOPHEN 5-325 MG PO TABS: ORAL_TABLET | ORAL | 0 refills | Status: DC

## 2023-07-08 ENCOUNTER — Other Ambulatory Visit: Payer: Self-pay
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
## 2023-07-09 ENCOUNTER — Observation Stay (HOSPITAL_COMMUNITY)
Admission: EM | Admit: 2023-07-09 | Discharge: 2023-07-15 | Disposition: A | Discharge status: Other Acute Inpatient Hospital | Attending: Family Medicine | Admitting: Family Medicine

## 2023-07-09 ENCOUNTER — Encounter (HOSPITAL_COMMUNITY): Payer: Self-pay

## 2023-07-09 ENCOUNTER — Ambulatory Visit (INDEPENDENT_AMBULATORY_CARE_PROVIDER_SITE_OTHER)
Admission: EM | Admit: 2023-07-09 | Discharge: 2023-07-09 | Disposition: A | Discharge status: Other Acute Inpatient Hospital | Source: Home / Self Care

## 2023-07-09 ENCOUNTER — Emergency Department (HOSPITAL_COMMUNITY)

## 2023-07-09 DIAGNOSIS — G8929 Other chronic pain: Secondary | ICD-10-CM | POA: Insufficient documentation

## 2023-07-09 DIAGNOSIS — R45851 Suicidal ideations: Secondary | ICD-10-CM | POA: Insufficient documentation

## 2023-07-09 DIAGNOSIS — I5022 Chronic systolic (congestive) heart failure: Secondary | ICD-10-CM | POA: Insufficient documentation

## 2023-07-09 DIAGNOSIS — Z79899 Other long term (current) drug therapy: Secondary | ICD-10-CM | POA: Insufficient documentation

## 2023-07-09 DIAGNOSIS — R441 Visual hallucinations: Secondary | ICD-10-CM | POA: Diagnosis not present

## 2023-07-09 DIAGNOSIS — F333 Major depressive disorder, recurrent, severe with psychotic symptoms: Secondary | ICD-10-CM | POA: Insufficient documentation

## 2023-07-09 DIAGNOSIS — Z7901 Long term (current) use of anticoagulants: Secondary | ICD-10-CM | POA: Diagnosis not present

## 2023-07-09 DIAGNOSIS — I517 Cardiomegaly: Secondary | ICD-10-CM | POA: Diagnosis not present

## 2023-07-09 DIAGNOSIS — I428 Other cardiomyopathies: Secondary | ICD-10-CM | POA: Diagnosis not present

## 2023-07-09 DIAGNOSIS — I509 Heart failure, unspecified: Secondary | ICD-10-CM | POA: Insufficient documentation

## 2023-07-09 DIAGNOSIS — I480 Paroxysmal atrial fibrillation: Secondary | ICD-10-CM | POA: Diagnosis present

## 2023-07-09 DIAGNOSIS — N184 Chronic kidney disease, stage 4 (severe): Secondary | ICD-10-CM | POA: Insufficient documentation

## 2023-07-09 DIAGNOSIS — E1122 Type 2 diabetes mellitus with diabetic chronic kidney disease: Secondary | ICD-10-CM

## 2023-07-09 DIAGNOSIS — G4733 Obstructive sleep apnea (adult) (pediatric): Secondary | ICD-10-CM | POA: Diagnosis present

## 2023-07-09 DIAGNOSIS — I130 Hypertensive heart and chronic kidney disease with heart failure and stage 1 through stage 4 chronic kidney disease, or unspecified chronic kidney disease: Secondary | ICD-10-CM | POA: Insufficient documentation

## 2023-07-09 DIAGNOSIS — Z6841 Body Mass Index (BMI) 40.0 and over, adult: Secondary | ICD-10-CM | POA: Diagnosis not present

## 2023-07-09 DIAGNOSIS — F341 Dysthymic disorder: Secondary | ICD-10-CM | POA: Insufficient documentation

## 2023-07-09 DIAGNOSIS — R079 Chest pain, unspecified: Secondary | ICD-10-CM | POA: Diagnosis present

## 2023-07-09 DIAGNOSIS — H540X33 Blindness right eye category 3, blindness left eye category 3: Secondary | ICD-10-CM | POA: Diagnosis not present

## 2023-07-09 DIAGNOSIS — Z8673 Personal history of transient ischemic attack (TIA), and cerebral infarction without residual deficits: Secondary | ICD-10-CM | POA: Insufficient documentation

## 2023-07-09 DIAGNOSIS — R778 Other specified abnormalities of plasma proteins: Secondary | ICD-10-CM | POA: Diagnosis not present

## 2023-07-09 DIAGNOSIS — R7989 Other specified abnormal findings of blood chemistry: Secondary | ICD-10-CM

## 2023-07-09 DIAGNOSIS — Z95810 Presence of automatic (implantable) cardiac defibrillator: Secondary | ICD-10-CM | POA: Diagnosis not present

## 2023-07-09 DIAGNOSIS — R072 Precordial pain: Secondary | ICD-10-CM

## 2023-07-09 DIAGNOSIS — Z794 Long term (current) use of insulin: Secondary | ICD-10-CM | POA: Insufficient documentation

## 2023-07-09 DIAGNOSIS — Z7984 Long term (current) use of oral hypoglycemic drugs: Secondary | ICD-10-CM | POA: Diagnosis not present

## 2023-07-09 DIAGNOSIS — H543 Unqualified visual loss, both eyes: Secondary | ICD-10-CM | POA: Diagnosis present

## 2023-07-09 DIAGNOSIS — D751 Secondary polycythemia: Secondary | ICD-10-CM | POA: Insufficient documentation

## 2023-07-09 DIAGNOSIS — H548 Legal blindness, as defined in USA: Secondary | ICD-10-CM | POA: Insufficient documentation

## 2023-07-09 DIAGNOSIS — F32A Depression, unspecified: Secondary | ICD-10-CM

## 2023-07-09 DIAGNOSIS — N1831 Chronic kidney disease, stage 3a: Secondary | ICD-10-CM | POA: Diagnosis present

## 2023-07-09 DIAGNOSIS — I10 Essential (primary) hypertension: Secondary | ICD-10-CM | POA: Diagnosis present

## 2023-07-09 DIAGNOSIS — R0789 Other chest pain: Secondary | ICD-10-CM | POA: Diagnosis not present

## 2023-07-09 LAB — CBC
HCT: 51.2 % (ref 39.0–52.0)
Hemoglobin: 17.4 g/dL — ABNORMAL HIGH (ref 13.0–17.0)
MCH: 30.1 pg (ref 26.0–34.0)
MCHC: 34 g/dL (ref 30.0–36.0)
MCV: 88.6 fL (ref 80.0–100.0)
Platelets: 230 10*3/uL (ref 150–400)
RBC: 5.78 MIL/uL (ref 4.22–5.81)
RDW: 14.6 % (ref 11.5–15.5)
WBC: 11.7 10*3/uL — ABNORMAL HIGH (ref 4.0–10.5)
nRBC: 0 % (ref 0.0–0.2)

## 2023-07-09 LAB — ACETAMINOPHEN LEVEL: Acetaminophen (Tylenol), Serum: <10 ug/mL — ABNORMAL LOW (ref 10–30)

## 2023-07-09 LAB — ETHANOL: Alcohol, Ethyl (B): <10 mg/dL (ref <10)

## 2023-07-09 LAB — SALICYLATE LEVEL: Salicylate Lvl: <7 mg/dL — ABNORMAL LOW (ref 7.0–30.0)

## 2023-07-09 MED ORDER — ROSUVASTATIN CALCIUM 10 MG PO TABS: 10.0000 mg | ORAL_TABLET | Freq: Every day | ORAL | 3 refills | Status: AC

## 2023-07-09 MED ORDER — ISOSORBIDE MONONITRATE 20 MG PO TABS: 20.0000 mg | ORAL_TABLET | Freq: Two times a day (BID) | ORAL | 3 refills | Status: AC

## 2023-07-09 NOTE — ED Provider Notes (Signed)
 [place] EMERGENCY DEPARTMENT AT [facility] Provider Note   CSN: [id number] Arrival date & time: 07/09/23  2252     History {Add pertinent medical, surgical, social history, OB history to HPI:1} Chief Complaint  Patient presents with   Hallucinations   Suicidal    KEEVAN WOLZ is a 63 y.o. male.  The history is provided by the patient.   RAHSAAN WEAKLAND is a 63 y.o. male who presents to the Emergency Department complaining of SI.  He presents to the emergency department voluntarily for evaluation of suicidal ideation.  He states that a few days ago he started having increasing visual hallucinations that he finds disturbing.  He keeps finding a man trying to bother him while he is sitting on the porch.  He feels that this is distressing and he wants to take pills to kill himself.  He did not do anything in an attempt to self-harm.  He has experienced recurrent issues with hallucinations over the last several years.  No auditory hallucinations.  No tobacco, alcohol, drug use.  He has not taken his medication for the last 3 days because he has been so distressed.  He also states that he has had chest pain intermittently but this is a chronic and ongoing problem, unchanged from baseline.    Home Medications Prior to Admission medications   Medication Sig Start Date End Date Taking? Authorizing Provider  allopurinol (ZYLOPRIM) 100 MG tablet Take 0.5 tablets (50 mg total) by mouth daily. 07/03/23   Nestor Ramp, MD  apixaban (ELIQUIS) 5 MG TABS tablet Take 1 tablet (5 mg total) by mouth 2 (two) times daily. 07/03/23   Duke Salvia, MD  brimonidine (ALPHAGAN) 0.2 % ophthalmic solution Place 1 drop into both eyes 2 (two) times daily. 07/09/19   [provider]  carvedilol (COREG) 25 MG tablet Take 1 tablet (25 mg total) by mouth 2 (two) times daily with a meal. 02/11/12   Nestor Ramp, MD  cetirizine (ZYRTEC ALLERGY) 10 MG tablet Take 1 tablet (10 mg total) by mouth  daily. 09/03/22   Nestor Ramp, MD  cyclobenzaprine (FLEXERIL) 5 MG tablet Take 1 tablet (5 mg total) by mouth at bedtime as needed for muscle spasms. 06/17/23   Vonna Drafts, MD  dapagliflozin propanediol (FARXIGA) 10 MG TABS tablet Take 1 tablet (10 mg total) by mouth daily. 11/11/22   Duke Salvia, MD  diclofenac Sodium (VOLTAREN) 1 % GEL Apply 2 g topically 4 (four) times daily as needed (right knee pain). 07/03/23   Nestor Ramp, MD  dorzolamide-timolol (COSOPT) 22.3-6.8 MG/ML ophthalmic solution Place 1 drop into both eyes 2 (two) times daily. 08/12/19   [provider]  EPINEPHrine 0.3 mg/0.3 mL IJ SOAJ injection Inject 0.3 mg into the muscle as needed for anaphylaxis. 07/03/23   Nestor Ramp, MD  furosemide (LASIX) 40 MG tablet Take 1 tablet (40 mg total) by mouth as directed. Strength: 40 mg 07/03/23   Nestor Ramp, MD  glimepiride (AMARYL) 4 MG tablet Take 4 mg by mouth daily. 04/16/23   [provider]  HYDROcodone-acetaminophen (NORCO/VICODIN) 5-325 MG tablet TAKE ONE TABLET BY MOUTH FOUR TIMES DAILY. MAY take ONE additional for a total of FIVE PER DAY 07/07/23   Nestor Ramp, MD  insulin glargine-yfgn (SEMGLEE) 100 UNIT/ML Pen Inject 25 Units into the skin daily. 07/03/23 09/01/23  Nestor Ramp, MD  Insulin Pen Needle (BD PEN NEEDLE MICRO U/F) 32G  X 6 MM MISC Use as directed to administer insulin once daily 07/03/23   Nestor Ramp, MD  ipratropium (ATROVENT) 0.03 % nasal spray Place 2 sprays into both nostrils every 12 (twelve) hours. 06/17/23   Vonna Drafts, MD  isosorbide mononitrate (ISMO) 20 MG tablet Take 1 tablet (20 mg total) by mouth 2 (two) times daily. 07/09/23   Nestor Ramp, MD  latanoprost (XALATAN) 0.005 % ophthalmic solution Place 1 drop into both eyes every evening. 08/12/19   [provider]  mometasone-formoterol (DULERA) 200-5 MCG/ACT AERO Inhale 2 puffs into the lungs daily. 06/30/23   Nestor Ramp, MD  naloxone [facility]) nasal spray 4 mg/0.1 mL place ONE  SPRAY into THE nostril AS NEEDED (accidential overdose) Patient taking differently: Place 1 spray into the nose once as needed (Accidental overdose). 05/15/23   Nestor Ramp, MD  neomycin-polymyxin b-dexamethasone (MAXITROL) 3.5-10000-0.1 OINT Place 1 Application into both eyes at bedtime. 06/15/23   [provider]  nitroGLYCERIN (NITROSTAT) 0.4 MG SL tablet Place 0.4 mg under the tongue every 5 (five) minutes as needed for chest pain. 01/24/19   [provider]  QUEtiapine (SEROQUEL) 300 MG tablet Take 1 tablet (300 mg total) by mouth at bedtime. 06/23/23 07/23/23  Elayne Snare K, DO  QUEtiapine (SEROQUEL) 50 MG tablet Take 3 tablets (150 mg total) by mouth at bedtime. 06/30/23   Nestor Ramp, MD  rosuvastatin (CRESTOR) 10 MG tablet Take 1 tablet (10 mg total) by mouth daily. 07/09/23   Nestor Ramp, MD  sacubitril-valsartan (ENTRESTO) 49-51 MG Take 1 tablet by mouth 2 (two) times daily.    [provider]  Vitamin D, Ergocalciferol, (DRISDOL) 1.25 MG (50000 UNIT) CAPS capsule Take 50,000 Units by mouth once a week. 03/31/23   [provider]  lisinopril (PRINIVIL,ZESTRIL) 40 MG tablet Take 40 mg by mouth 2 (two) times daily.    06/17/11  [provider]      Allergies    Bee pollen, Bee venom, Shellfish allergy, and Nsaids    Review of Systems   Review of Systems  All other systems reviewed and are negative.   Physical Exam Updated Vital Signs BP (!) 136/116 (BP Location: Left Arm)   Pulse 98   Temp 99.3 F (37.4 C) (Oral)   Resp 18   SpO2 96%  Physical Exam Vitals and nursing note reviewed.  Constitutional:      Appearance: He is well-developed.  HENT:     Head: Normocephalic and atraumatic.  Cardiovascular:     Rate and Rhythm: Normal rate and regular rhythm.     Heart sounds: No murmur heard. Pulmonary:     Effort: Pulmonary effort is normal. No respiratory distress.     Breath sounds: Normal breath sounds.  Abdominal:      Palpations: Abdomen is soft.     Tenderness: There is no abdominal tenderness. There is no guarding or rebound.  Musculoskeletal:        General: No tenderness.     Comments: Trace pitting edema to bilateral lower extremities  Skin:    General: Skin is warm and dry.  Neurological:     Mental Status: He is alert and oriented to person, place, and time.  Psychiatric:        Behavior: Behavior normal.     ED Results / Procedures / Treatments   Labs (all labs ordered are listed, but only abnormal results are displayed) Labs Reviewed  CBC - Abnormal; Notable  for the following components:      Result Value   WBC 11.7 (*)    Hemoglobin 17.4 (*)    All other components within normal limits  COMPREHENSIVE METABOLIC PANEL WITH GFR  ETHANOL  SALICYLATE LEVEL  ACETAMINOPHEN LEVEL  RAPID URINE DRUG SCREEN, HOSP PERFORMED  TROPONIN I (HIGH SENSITIVITY)    EKG EKG Interpretation Date/Time:  Thursday July 09 2023 23:15:37 EDT Ventricular Rate:  95 PR Interval:  162 QRS Duration:  108 QT Interval:  391 QTC Calculation: 492 R Axis:   -17  Text Interpretation: Sinus rhythm Sinus pause Borderline left axis deviation Low voltage, precordial leads Consider anterior infarct Artifact in lead(s) I II aVR aVL aVF Poor data quality Confirmed by Tilden Fossa [phone]) on 07/09/2023 11:27:14 PM  Radiology No results found.  Procedures Procedures  {Document cardiac monitor, telemetry assessment procedure when appropriate:1}  Medications Ordered in ED Medications - No data to display  ED Course/ Medical Decision Making/ A&P   {   Click here for ABCD2, HEART and other calculatorsREFRESH Note before signing :1}                              Medical Decision Making Amount and/or Complexity of Data Reviewed Labs: ordered. Radiology: ordered.   ***  {Document critical care time when appropriate:1} {Document review of labs and clinical decision tools ie heart score, Chads2Vasc2 etc:1}   {Document your independent review of radiology images, and any outside records:1} {Document your discussion with family members, caretakers, and with consultants:1} {Document social determinants of health affecting pt's care:1} {Document your decision making why or why not admission, treatments were needed:1} Final Clinical Impression(s) / ED Diagnoses Final diagnoses:  None    Rx / DC Orders ED Discharge Orders     None

## 2023-07-09 NOTE — ED Triage Notes (Signed)
 Pt is legally blind and states that he has been having visual hallucinations for the past 3 years. Pt states he sees people and bright lights, and colors. Denies auditory hallucinations. Pt states that he was overwhelmed today and thoughts of taking a bunch of pills to end his life, denies HI, pt also states he has intermittent CP that radiates to his neck.

## 2023-07-09 NOTE — ED Notes (Signed)
 Report given to [email or web address]  long ed

## 2023-07-09 NOTE — Telephone Encounter (Signed)
 [name] returns call to nurse line. [facility] is with patient now. She states that he is voluntarily going to [facility]. She reports never filing IVC with local sheriff's department, as she is still recovering herself and was unable to make the trip.   She reports that she was told to contact Dr. Jennette Kettle to see if she could start the process for filling out a FL2 for patient.   Advised that I would send message to Dr. Jennette Kettle, however, social work or VBCI referral may be appropriate in this situation.   Forwarding to PCP.   Veronda Prude, RN

## 2023-07-09 NOTE — Progress Notes (Signed)
   07/09/23 1520  [facility] Triage Screening (Walk-ins at [facility] only)  What Is the Reason for Your Visit/Call Today? Pt presents to [facility] voluntarily accompanied by family. Pt states that he has been seeing things, especially today. "A lot of guys like theyre gonna jump me". VH got worse three weeks ago. "im gonna hurt them tho" Pt shares he wants to hurt the people in his hallucinations. Pt states he has come to stay for days or weeks. Pt denies SI, HI, AVH, Abuse, Alcohol/drug Use.  How Long Has This Been Causing You Problems? 1 wk - 1 month  Have You Recently Had Any Thoughts About Hurting Yourself? No  Are You Planning to Commit Suicide/Harm Yourself At This time? No  Have you Recently Had Thoughts About Hurting Someone Karolee Ohs? Yes  How long ago did you have thoughts of harming others? "them" - the hallucinations  Are You Planning To Harm Someone At This Time? No ("because nobody is messing with me")  Physical Abuse Denies  Verbal Abuse Denies  Sexual Abuse Denies  Exploitation of patient/patient's resources Denies  Self-Neglect Denies  Are you currently experiencing any auditory, visual or other hallucinations? Yes  Please explain the hallucinations you are currently experiencing: Endorsing VH at this time  Have You Used Any Alcohol or Drugs in the Past 24 Hours? No  Do you have any current medical co-morbidities that require immediate attention? Yes  Please describe current medical co-morbidities that require immediate attention: Heart problems - (has a difibulator), high BP, diabetes, glocoma, total blindness  Clinician description of patient physical appearance/behavior: Pt is calm but looking around and above heads for [facility]  What Do You Feel Would Help You the Most Today? Treatment for Depression or other mood problem  If [facility] to [facility] was not available, would you have sought care in the Emergency Department? No  Determination of Need Routine (7 days)  Options For Referral Inpatient  Hospitalization;Intensive Outpatient Therapy;Outpatient Therapy

## 2023-07-09 NOTE — Discharge Instructions (Addendum)

## 2023-07-09 NOTE — ED Provider Notes (Incomplete)
 Behavioral Health Urgent Care Medical Screening Exam  Patient Name: [name] MRN: [id number] Date of Evaluation: 07/10/23 Chief Complaint: "I feel like killing myself".  Diagnosis:  Final diagnoses:  Suicidal ideation  Severe episode of recurrent major depressive disorder, with psychotic features (HCC)  Hallucinations, visual    History of Present illness: [name] is a 63 y.o. male.  With no pertinent psychiatric history and multiple medical histories including blindness in both eyes, congestive heart failure, OSA, and chronic pain, who presented voluntarily to [facility] accompanied by his Oldest daughter [name] [phone] with complaints of visual hallucinations and passive SI.  Patient has a cane and is in a wheelchair, assisted by his daughter.  Patient was seen face-to-face by this provider and chart reviewed.  Patient reports "I feel like killing myself, sometimes I'm seeing things and that's what's going on".  Patient endorses suicidal ideation, denies a plan, and states "but I never know what time I might do it, I just need to go be committed, that's all, I'm tired of living like this, I've been feeling suicidal since last week, last week I felt like taking a whole bunch of medicines, pills, I just need to be committed to see what I'm doing".  Patient endorses visual hallucinations for the past three years, and states "lights, different colored lights, white lights, bright lights, people, other people don't see them, but I see them, I'm just tired".  Patient reports he is not established with outpatient psychiatric services for medication management or therapy, but sees his primary care provider for heart, blood pressure and insulin medications.  His daughter reports he takes Seroquel for sleep.  The patient lives with his youngest son.  On evaluation, patient is alert, oriented x 3, and cooperative. Speech is clear, normal rate and coherent. Pt appears casually  dressed. There is no eye contact, pt is blind in both eyes. Mood is anxious and depressed, affect is congruent with mood. Thought process is coherent and goal directed, and thought content is WDL. Pt endorses passive SI, denies HI, endorses AVH. There is no objective indication that the patient is responding to internal stimuli. No delusions elicited during this assessment.    Support, encouragement, reassurance provided about ongoing stressors.   Discussed recommendation for inpatient psychiatric admission for stabilization and treatment. Discussed recommendation for transfer to [facility] for safety monitoring pending inpatient psychiatric hospitalization as there are no available beds spaces at the [facility] at the time of this evaluation. Patient and his daughter are provided with opportunities for questions.  They both verbalized understanding and are in agreement.  Flowsheet Row ED from 07/09/2023 in [facility] Emergency Department at [facility] Most recent reading at 07/09/2023 11:04 PM ED from 07/09/2023 in [facility] Most recent reading at 07/09/2023  3:56 PM ED from 06/23/2023 in [facility] Emergency Department at [facility] Most recent reading at 06/23/2023  6:33 AM  C-SSRS RISK CATEGORY High Risk No Risk No Risk       Psychiatric Specialty Exam  Presentation  General Appearance:Casual  Eye Contact:None (Pt is blind)  Speech:Clear and Coherent  Speech Volume:Normal  Handedness:Right   Mood and Affect  Mood: Depressed; Anxious  Affect: Congruent; Blunt   Thought Process  Thought Processes: Coherent; Goal Directed  Descriptions of Associations:Intact  Orientation:Full (Time, Place and Person)  Thought Content:WDL  Diagnosis of Schizophrenia or Schizoaffective disorder in past: No data recorded  Hallucinations:Visual Pt reports seeing different  lights and  people.  Ideas of Reference:None  Suicidal Thoughts:Yes,  Active Without Plan  Homicidal Thoughts:No   Sensorium  Memory: Immediate Fair  Judgment: Poor  Insight: Shallow   Executive Functions  Concentration: Good  Attention Span: Good  Recall: Good  Fund of Knowledge: Good  Language: Good   Psychomotor Activity  Psychomotor Activity: Normal   Assets  Assets: Communication Skills; Desire for Improvement; Social Support   Sleep  Sleep: Good  Number of hours: No data recorded  Physical Exam: Physical Exam Constitutional:      General: He is not in acute distress.    Appearance: He is obese. He is not diaphoretic.  HENT:     Head: Normocephalic.     Right Ear: External ear normal.     Left Ear: External ear normal.     Nose: No congestion.  Pulmonary:     Effort: No respiratory distress.  Neurological:     Mental Status: He is alert and oriented to person, place, and time.  Psychiatric:        Attention and Perception: He perceives visual hallucinations.        Mood and Affect: Mood is anxious and depressed. Affect is flat.        Speech: Speech normal.        Behavior: Behavior is cooperative.        Thought Content: Thought content includes suicidal ideation. Thought content does not include suicidal plan.    Review of Systems  Constitutional:  Negative for chills, diaphoresis and fever.  HENT:  Negative for congestion.   Eyes:  Negative for discharge.  Respiratory:  Negative for cough, shortness of breath and wheezing.   Cardiovascular:  Negative for chest pain and palpitations.  Gastrointestinal:  Negative for diarrhea, nausea and vomiting.  Neurological:  Negative for dizziness and weakness.  Psychiatric/Behavioral:  Positive for depression, hallucinations and suicidal ideas.    Blood pressure 135/73, pulse (!) 107, temperature 98.2 F (36.8 C), resp. rate 18, SpO2 93%. There is no height or weight on file to calculate BMI.  Musculoskeletal: Strength & Muscle Tone: decreased Gait &  Station:  Weak Patient leans:  In a wheelchair   [facility] MSE Discharge Disposition for Follow up and Recommendations: Based on my evaluation, the patient does not have an emergency  condition.  However there are no available bed space at the [facility] at the time of this evaluation and patient is recommended for transfer to [facility] for safety monitoring pending inpatient psychiatric hospitalization.  Recommend inpatient psychiatric admission for stabilization and treatment.  I spoke with Dr. Renaye Rakers at [facility] and the provider has agreed to accept the patient. EMTALA completed.   Mancel Bale, NP 07/10/2023, 12:19 AM

## 2023-07-10 ENCOUNTER — Other Ambulatory Visit: Payer: Self-pay

## 2023-07-10 ENCOUNTER — Encounter (HOSPITAL_COMMUNITY): Payer: Self-pay | Admitting: Internal Medicine

## 2023-07-10 DIAGNOSIS — Z794 Long term (current) use of insulin: Secondary | ICD-10-CM

## 2023-07-10 DIAGNOSIS — I428 Other cardiomyopathies: Secondary | ICD-10-CM

## 2023-07-10 DIAGNOSIS — R079 Chest pain, unspecified: Secondary | ICD-10-CM

## 2023-07-10 DIAGNOSIS — I480 Paroxysmal atrial fibrillation: Secondary | ICD-10-CM

## 2023-07-10 DIAGNOSIS — R45851 Suicidal ideations: Secondary | ICD-10-CM | POA: Diagnosis not present

## 2023-07-10 DIAGNOSIS — I2510 Atherosclerotic heart disease of native coronary artery without angina pectoris: Secondary | ICD-10-CM | POA: Diagnosis not present

## 2023-07-10 DIAGNOSIS — R7989 Other specified abnormal findings of blood chemistry: Secondary | ICD-10-CM | POA: Diagnosis not present

## 2023-07-10 DIAGNOSIS — F32A Depression, unspecified: Secondary | ICD-10-CM

## 2023-07-10 DIAGNOSIS — E1122 Type 2 diabetes mellitus with diabetic chronic kidney disease: Secondary | ICD-10-CM | POA: Diagnosis not present

## 2023-07-10 DIAGNOSIS — N184 Chronic kidney disease, stage 4 (severe): Secondary | ICD-10-CM | POA: Diagnosis not present

## 2023-07-10 DIAGNOSIS — I517 Cardiomegaly: Secondary | ICD-10-CM | POA: Diagnosis not present

## 2023-07-10 DIAGNOSIS — N1832 Chronic kidney disease, stage 3b: Secondary | ICD-10-CM

## 2023-07-10 DIAGNOSIS — I10 Essential (primary) hypertension: Secondary | ICD-10-CM

## 2023-07-10 DIAGNOSIS — R0789 Other chest pain: Secondary | ICD-10-CM | POA: Diagnosis not present

## 2023-07-10 DIAGNOSIS — N1831 Chronic kidney disease, stage 3a: Secondary | ICD-10-CM

## 2023-07-10 DIAGNOSIS — R748 Abnormal levels of other serum enzymes: Secondary | ICD-10-CM | POA: Diagnosis not present

## 2023-07-10 DIAGNOSIS — R441 Visual hallucinations: Secondary | ICD-10-CM | POA: Diagnosis not present

## 2023-07-10 LAB — RAPID URINE DRUG SCREEN, HOSP PERFORMED
Amphetamines: NOT DETECTED
Barbiturates: NOT DETECTED
Benzodiazepines: NOT DETECTED
Cocaine: NOT DETECTED
Opiates: NOT DETECTED
Tetrahydrocannabinol: NOT DETECTED

## 2023-07-10 LAB — COMPREHENSIVE METABOLIC PANEL WITH GFR
ALT: 10 U/L (ref 0–44)
AST: 17 U/L (ref 15–41)
Albumin: 3.8 g/dL (ref 3.5–5.0)
Alkaline Phosphatase: 59 U/L (ref 38–126)
Anion gap: 10 (ref 5–15)
BUN: 20 mg/dL (ref 8–23)
CO2: 21 mmol/L — ABNORMAL LOW (ref 22–32)
Calcium: 8.8 mg/dL — ABNORMAL LOW (ref 8.9–10.3)
Chloride: 110 mmol/L (ref 98–111)
Creatinine, Ser: 1.66 mg/dL — ABNORMAL HIGH (ref 0.61–1.24)
GFR, Estimated: 46 mL/min — ABNORMAL LOW (ref >60)
Glucose, Bld: 163 mg/dL — ABNORMAL HIGH (ref 70–99)
Potassium: 3.2 mmol/L — ABNORMAL LOW (ref 3.5–5.1)
Sodium: 141 mmol/L (ref 135–145)
Total Bilirubin: 1 mg/dL (ref 0.0–1.2)
Total Protein: 8.1 g/dL (ref 6.5–8.1)

## 2023-07-10 LAB — HEPATIC FUNCTION PANEL
ALT: 11 U/L (ref 0–44)
AST: 14 U/L — ABNORMAL LOW (ref 15–41)
Albumin: 3.1 g/dL — ABNORMAL LOW (ref 3.5–5.0)
Alkaline Phosphatase: 51 U/L (ref 38–126)
Bilirubin, Direct: 0.2 mg/dL (ref 0.0–0.2)
Indirect Bilirubin: 1 mg/dL — ABNORMAL HIGH (ref 0.3–0.9)
Total Bilirubin: 1.2 mg/dL (ref 0.0–1.2)
Total Protein: 7.1 g/dL (ref 6.5–8.1)

## 2023-07-10 LAB — BASIC METABOLIC PANEL WITH GFR
Anion gap: 10 (ref 5–15)
BUN: 19 mg/dL (ref 8–23)
CO2: 23 mmol/L (ref 22–32)
Calcium: 8.1 mg/dL — ABNORMAL LOW (ref 8.9–10.3)
Chloride: 110 mmol/L (ref 98–111)
Creatinine, Ser: 1.65 mg/dL — ABNORMAL HIGH (ref 0.61–1.24)
GFR, Estimated: 46 mL/min — ABNORMAL LOW (ref >60)
Glucose, Bld: 143 mg/dL — ABNORMAL HIGH (ref 70–99)
Potassium: 3.2 mmol/L — ABNORMAL LOW (ref 3.5–5.1)
Sodium: 143 mmol/L (ref 135–145)

## 2023-07-10 LAB — TROPONIN I (HIGH SENSITIVITY)
Troponin I (High Sensitivity): 82 ng/L — ABNORMAL HIGH (ref <18)
Troponin I (High Sensitivity): 89 ng/L — ABNORMAL HIGH (ref <18)
Troponin I (High Sensitivity): 79 ng/L — ABNORMAL HIGH (ref <18)
Troponin I (High Sensitivity): 67 ng/L — ABNORMAL HIGH (ref <18)

## 2023-07-10 LAB — CBC WITH DIFFERENTIAL/PLATELET
Abs Immature Granulocytes: 0.04 10*3/uL (ref 0.00–0.07)
Basophils Absolute: 0 10*3/uL (ref 0.0–0.1)
Basophils Relative: 0 %
Eosinophils Absolute: 0.1 10*3/uL (ref 0.0–0.5)
Eosinophils Relative: 1 %
HCT: 39.8 % (ref 39.0–52.0)
Hemoglobin: 13.1 g/dL (ref 13.0–17.0)
Immature Granulocytes: 0 %
Lymphocytes Relative: 24 %
Lymphs Abs: 2.3 10*3/uL (ref 0.7–4.0)
MCH: 29.2 pg (ref 26.0–34.0)
MCHC: 32.9 g/dL (ref 30.0–36.0)
MCV: 88.6 fL (ref 80.0–100.0)
Monocytes Absolute: 1.1 10*3/uL — ABNORMAL HIGH (ref 0.1–1.0)
Monocytes Relative: 11 %
Neutro Abs: 6 10*3/uL (ref 1.7–7.7)
Neutrophils Relative %: 64 %
Platelets: 184 10*3/uL (ref 150–400)
RBC: 4.49 MIL/uL (ref 4.22–5.81)
RDW: 14.4 % (ref 11.5–15.5)
WBC: 9.5 10*3/uL (ref 4.0–10.5)
nRBC: 0 % (ref 0.0–0.2)

## 2023-07-10 LAB — GLUCOSE, CAPILLARY: Glucose-Capillary: 191 mg/dL — ABNORMAL HIGH (ref 70–99)

## 2023-07-10 LAB — HIV ANTIBODY (ROUTINE TESTING W REFLEX): HIV Screen 4th Generation wRfx: NONREACTIVE

## 2023-07-10 LAB — D-DIMER, QUANTITATIVE: D-Dimer, Quant: 0.28 ug{FEU}/mL (ref 0.00–0.50)

## 2023-07-10 MED ORDER — APIXABAN 5 MG PO TABS
5.0000 mg | ORAL_TABLET | Freq: Two times a day (BID) | ORAL | Status: DC
  Administered 2023-07-10 – 2023-07-15 (×11): 5 mg via ORAL
  Filled 2023-07-10 (×11): qty 1

## 2023-07-10 MED ORDER — NITROGLYCERIN 0.4 MG SL SUBL: 0.4000 mg | SUBLINGUAL_TABLET | SUBLINGUAL | Status: DC | PRN

## 2023-07-10 MED ORDER — POTASSIUM CHLORIDE CRYS ER 20 MEQ PO TBCR
20.0000 meq | EXTENDED_RELEASE_TABLET | Freq: Once | ORAL | Status: AC
  Administered 2023-07-10: 20 meq via ORAL
  Filled 2023-07-10: qty 1

## 2023-07-10 MED ORDER — ALLOPURINOL 100 MG PO TABS
50.0000 mg | ORAL_TABLET | Freq: Every day | ORAL | Status: DC
  Administered 2023-07-10 – 2023-07-15 (×6): 50 mg via ORAL
  Filled 2023-07-10 (×6): qty 1

## 2023-07-10 MED ORDER — INSULIN GLARGINE-YFGN 100 UNIT/ML ~~LOC~~ SOLN
25.0000 [IU] | Freq: Every day | SUBCUTANEOUS | Status: DC
  Administered 2023-07-10 – 2023-07-15 (×6): 25 [IU] via SUBCUTANEOUS
  Filled 2023-07-10 (×6): qty 0.25

## 2023-07-10 MED ORDER — QUETIAPINE FUMARATE 50 MG PO TABS: 150.0000 mg | ORAL_TABLET | Freq: Every day | ORAL | Status: DC

## 2023-07-10 MED ORDER — CARVEDILOL 12.5 MG PO TABS
25.0000 mg | ORAL_TABLET | Freq: Two times a day (BID) | ORAL | Status: DC
  Administered 2023-07-10 – 2023-07-15 (×11): 25 mg via ORAL
  Filled 2023-07-10 (×11): qty 2

## 2023-07-10 MED ORDER — ROSUVASTATIN CALCIUM 20 MG PO TABS
10.0000 mg | ORAL_TABLET | Freq: Every day | ORAL | Status: DC
  Administered 2023-07-10 – 2023-07-15 (×6): 10 mg via ORAL
  Filled 2023-07-10 (×6): qty 1

## 2023-07-10 MED ORDER — RISPERIDONE 1 MG PO TABS
0.5000 mg | ORAL_TABLET | Freq: Two times a day (BID) | ORAL | Status: DC
  Administered 2023-07-10 – 2023-07-15 (×11): 0.5 mg via ORAL
  Filled 2023-07-10 (×11): qty 1

## 2023-07-10 MED ORDER — ISOSORBIDE MONONITRATE 10 MG PO TABS
20.0000 mg | ORAL_TABLET | Freq: Two times a day (BID) | ORAL | Status: DC
  Administered 2023-07-10 – 2023-07-15 (×11): 20 mg via ORAL
  Filled 2023-07-10 (×12): qty 2

## 2023-07-10 MED ORDER — MOMETASONE FURO-FORMOTEROL FUM 200-5 MCG/ACT IN AERO
2.0000 | INHALATION_SPRAY | Freq: Every day | RESPIRATORY_TRACT | Status: DC
  Administered 2023-07-11 – 2023-07-15 (×5): 2 via RESPIRATORY_TRACT
  Filled 2023-07-10: qty 8.8

## 2023-07-10 MED ORDER — LATANOPROST 0.005 % OP SOLN
1.0000 [drp] | Freq: Every evening | OPHTHALMIC | Status: DC
  Administered 2023-07-10 – 2023-07-14 (×5): 1 [drp] via OPHTHALMIC
  Filled 2023-07-10: qty 2.5

## 2023-07-10 MED ORDER — POTASSIUM CHLORIDE CRYS ER 20 MEQ PO TBCR
40.0000 meq | EXTENDED_RELEASE_TABLET | Freq: Once | ORAL | Status: AC
  Administered 2023-07-10: 40 meq via ORAL
  Filled 2023-07-10: qty 2

## 2023-07-10 MED ORDER — ASPIRIN 81 MG PO CHEW
324.0000 mg | CHEWABLE_TABLET | Freq: Once | ORAL | Status: AC
  Administered 2023-07-10: 324 mg via ORAL
  Filled 2023-07-10: qty 4

## 2023-07-10 MED ORDER — BRIMONIDINE TARTRATE 0.2 % OP SOLN
1.0000 [drp] | Freq: Two times a day (BID) | OPHTHALMIC | Status: DC
  Administered 2023-07-10 – 2023-07-15 (×10): 1 [drp] via OPHTHALMIC
  Filled 2023-07-10: qty 5

## 2023-07-10 MED ORDER — CARVEDILOL 12.5 MG PO TABS
25.0000 mg | ORAL_TABLET | Freq: Once | ORAL | Status: AC
  Administered 2023-07-10: 25 mg via ORAL
  Filled 2023-07-10: qty 2

## 2023-07-10 MED ORDER — SACUBITRIL-VALSARTAN 49-51 MG PO TABS
1.0000 | ORAL_TABLET | Freq: Two times a day (BID) | ORAL | Status: DC
  Administered 2023-07-10 – 2023-07-15 (×11): 1 via ORAL
  Filled 2023-07-10 (×11): qty 1

## 2023-07-10 MED ORDER — INSULIN ASPART 100 UNIT/ML IJ SOLN
0.0000 [IU] | Freq: Three times a day (TID) | INTRAMUSCULAR | Status: DC
  Administered 2023-07-11 – 2023-07-13 (×4): 2 [IU] via SUBCUTANEOUS

## 2023-07-10 MED ORDER — DAPAGLIFLOZIN PROPANEDIOL 10 MG PO TABS
10.0000 mg | ORAL_TABLET | Freq: Every day | ORAL | Status: DC
  Administered 2023-07-10 – 2023-07-15 (×6): 10 mg via ORAL
  Filled 2023-07-10 (×6): qty 1

## 2023-07-10 MED ORDER — DORZOLAMIDE HCL-TIMOLOL MAL 2-0.5 % OP SOLN
1.0000 [drp] | Freq: Two times a day (BID) | OPHTHALMIC | Status: DC
  Administered 2023-07-10 – 2023-07-15 (×10): 1 [drp] via OPHTHALMIC
  Filled 2023-07-10: qty 10

## 2023-07-10 MED ORDER — ACETAMINOPHEN 325 MG PO TABS
650.0000 mg | ORAL_TABLET | ORAL | Status: DC | PRN
  Administered 2023-07-10: 650 mg via ORAL
  Filled 2023-07-10 (×2): qty 2

## 2023-07-10 NOTE — H&P (Addendum)
 History and Physical    [name] UJW:[id number] DOB: 1960-06-04 DOA: 07/09/2023  Patient coming from: Home.  Chief Complaint: Suicidal thoughts.  Chest pain.  HPI: [name] is a 63 y.o. male with history of nonischemic cardiomyopathy, diabetes mellitus type 2, chronic kidney disease stage III, hypertension, paroxysmal atrial fibrillation, blindness in both eyes had originally come to behavioral health after patient's family noticed that patient has been complaining of suicidal thoughts and also has been recently having visual hallucinations.  This has been ongoing for last almost a month.  Patient was recently placed on Seroquel despite taking which symptoms have not improved.  At times patient walks around with a knife threatening to kill himself.  Yesterday patient was brought to the ER at behavioral health for suicidal thoughts but started complaining of chest pain and was brought to [facility] ER.  Patient states he has been chest pain off and on last 2 to 3 days it is retrosternal burning in sensation with no associated shortness of breath.  ED Course: In the ER EKG shows A-fib rate controlled and troponins were 82 and 89.  Chest x-ray unremarkable.  Potassium was 3.2 for which potassium replacement was given creatinine is at baseline around 1.6 hemoglobin 17.4 WBC 11.7.  I discussed with patient's cardiologist Dr. Sharyn Lull who will be seeing patient in consult.  At the time of my exam patient is chest pain-free.  Review of Systems: As per HPI, rest all negative.   Past Medical History:  Diagnosis Date   Arthritis    Arthritis of knee, degenerative 10/03/2011   Right > LEFT KNEE oa X-rays were not standing but showed some mild sclerosis and joint space loss of the medial compartment of the right knee. History of arthroscopy right knee. Report of injury as a child but unclear exactly what that was.  :I am aware of 12/01/18 Rx by Dr. Jennette Kettle.  While wife was hospitalized, narcotic  Rx went missing when in-home sitter left.  To my knowledge, this is the first mi   Automatic implantable cardioverter-defibrillator in situ 07/15/2013   Blind in both eyes 06/15/2013   Right Sees Dr Dione Booze   Cataracts, bilateral    right   Chronic gout due to renal impairment of multiple sites without tophus 04/12/2020   Chronic systolic heart failure (HCC) 02/18/2007   Qualifier: Diagnosis of  By: Bryson Ha MD, Gwendalyn Ege    Congestive heart failure (HCC)    a. s/p MDT single chamber ICD    Diabetes mellitus    does not check blood sugars at home   Diverticulosis    Encounter for chronic pain management 05/17/2014   Indication for chronic opioid: severe arthritis RIGHTknee / low back Medication and dose: hydrocodone---uses prn # pills per month: gets #120 pills to last TWO months Last UDS date: 05/17/2014 Pain contract signed (Y/N): Y 05/17/2014 Date narcotic database last reviewed (include red flags): 05/17/2014    Glaucoma    bilateral   Glaucoma 11/07/2015   Severe See Dr. Dione Booze   H/O TIA (transient ischemic attack) and stroke    Hyperlipidemia    Hyperlipidemia with target low density lipoprotein (LDL) cholesterol less than 100 mg/dL 5/62/1308   Hypertension    HYPERTENSION, BENIGN 02/18/2007   Qualifier: Diagnosis of  By: Jennette Kettle MD, Ronald Lobo of both feet 04/12/2020   NICM (nonischemic cardiomyopathy) (HCC) 05/30/2015   Obesity    OBESITY, MORBID 05/21/2010   Qualifier: Diagnosis of  ByGraciela Husbands, MD, Susie Cassette    Obstructive sleep apnea 03/27/2009   Qualifier: Diagnosis of  By: Sherral Hammers, RN, BSN, Melanie     Paroxysmal atrial fibrillation (HCC) 09/21/2019   Paroxysmal VT (HCC) 06/22/2019   RENAL DISEASE, CHRONIC, STAGE III 02/18/2007   SHOULDER PAIN, LEFT 08/01/2009   History of left shoulder surgery.     Single implantable cardioverter-defibrillator (ICD) in situ 06/17/2011   Type 2 diabetes mellitus with stage 4 chronic kidney disease, without long-term current use of  insulin (HCC) 08/25/2007    Past Surgical History:  Procedure Laterality Date   CARDIAC CATHETERIZATION  10/24/2003   EF of 45-50%   CARDIAC DEFIBRILLATOR PLACEMENT  2008   CATARACT EXTRACTION  2012   right   EP IMPLANTABLE DEVICE N/A 05/30/2015   Procedure: ICD Generator Changeout;  Surgeon: Duke Salvia, MD;  Location: [facility] INVASIVE CV LAB;  Service: Cardiovascular;  Laterality: N/A;   KNEE SURGERY Right 2000   SHOULDER SURGERY  2012   left     reports that he has never smoked. He has never used smokeless tobacco. He reports that he does not drink alcohol and does not use drugs.  Allergies  Allergen Reactions   Bee Pollen Anaphylaxis, Itching and Swelling   Bee Venom Anaphylaxis   Shellfish Allergy Anaphylaxis   Nsaids Other (See Comments)    CKD and CHF    Family History  Problem Relation Age of Onset   Colon cancer Father    Esophageal cancer Neg Hx    Rectal cancer Neg Hx    Stomach cancer Neg Hx     Prior to Admission medications   Medication Sig Start Date End Date Taking? Authorizing Provider  allopurinol (ZYLOPRIM) 100 MG tablet Take 0.5 tablets (50 mg total) by mouth daily. 07/03/23   Nestor Ramp, MD  apixaban (ELIQUIS) 5 MG TABS tablet Take 1 tablet (5 mg total) by mouth 2 (two) times daily. 07/03/23   Duke Salvia, MD  brimonidine (ALPHAGAN) 0.2 % ophthalmic solution Place 1 drop into both eyes 2 (two) times daily. 07/09/19   [provider]  carvedilol (COREG) 25 MG tablet Take 1 tablet (25 mg total) by mouth 2 (two) times daily with a meal. 02/11/12   Nestor Ramp, MD  cetirizine (ZYRTEC ALLERGY) 10 MG tablet Take 1 tablet (10 mg total) by mouth daily. 09/03/22   Nestor Ramp, MD  cyclobenzaprine (FLEXERIL) 5 MG tablet Take 1 tablet (5 mg total) by mouth at bedtime as needed for muscle spasms. 06/17/23   Vonna Drafts, MD  dapagliflozin propanediol (FARXIGA) 10 MG TABS tablet Take 1 tablet (10 mg total) by mouth daily. 11/11/22   Duke Salvia, MD   diclofenac Sodium (VOLTAREN) 1 % GEL Apply 2 g topically 4 (four) times daily as needed (right knee pain). 07/03/23   Nestor Ramp, MD  dorzolamide-timolol (COSOPT) 22.3-6.8 MG/ML ophthalmic solution Place 1 drop into both eyes 2 (two) times daily. 08/12/19   [provider]  EPINEPHrine 0.3 mg/0.3 mL IJ SOAJ injection Inject 0.3 mg into the muscle as needed for anaphylaxis. 07/03/23   Nestor Ramp, MD  furosemide (LASIX) 40 MG tablet Take 1 tablet (40 mg total) by mouth as directed. Strength: 40 mg 07/03/23   Nestor Ramp, MD  glimepiride (AMARYL) 4 MG tablet Take 4 mg by mouth daily. 04/16/23   [provider]  HYDROcodone-acetaminophen (NORCO/VICODIN) 5-325 MG tablet TAKE ONE TABLET BY MOUTH FOUR  TIMES DAILY. MAY take ONE additional for a total of FIVE PER DAY 07/07/23   Nestor Ramp, MD  insulin glargine-yfgn (SEMGLEE) 100 UNIT/ML Pen Inject 25 Units into the skin daily. 07/03/23 09/01/23  Nestor Ramp, MD  Insulin Pen Needle (BD PEN NEEDLE MICRO U/F) 32G X 6 MM MISC Use as directed to administer insulin once daily 07/03/23   Nestor Ramp, MD  ipratropium (ATROVENT) 0.03 % nasal spray Place 2 sprays into both nostrils every 12 (twelve) hours. 06/17/23   Vonna Drafts, MD  isosorbide mononitrate (ISMO) 20 MG tablet Take 1 tablet (20 mg total) by mouth 2 (two) times daily. 07/09/23   Nestor Ramp, MD  latanoprost (XALATAN) 0.005 % ophthalmic solution Place 1 drop into both eyes every evening. 08/12/19   [provider]  mometasone-formoterol (DULERA) 200-5 MCG/ACT AERO Inhale 2 puffs into the lungs daily. 06/30/23   Nestor Ramp, MD  naloxone [facility]) nasal spray 4 mg/0.1 mL place ONE SPRAY into THE nostril AS NEEDED (accidential overdose) Patient taking differently: Place 1 spray into the nose once as needed (Accidental overdose). 05/15/23   Nestor Ramp, MD  neomycin-polymyxin b-dexamethasone (MAXITROL) 3.5-10000-0.1 OINT Place 1 Application into both eyes at bedtime. 06/15/23   [provider]  nitroGLYCERIN (NITROSTAT) 0.4 MG SL tablet Place 0.4 mg under the tongue every 5 (five) minutes as needed for chest pain. 01/24/19   [provider]  QUEtiapine (SEROQUEL) 300 MG tablet Take 1 tablet (300 mg total) by mouth at bedtime. 06/23/23 07/23/23  Elayne Snare K, DO  QUEtiapine (SEROQUEL) 50 MG tablet Take 3 tablets (150 mg total) by mouth at bedtime. 06/30/23   Nestor Ramp, MD  rosuvastatin (CRESTOR) 10 MG tablet Take 1 tablet (10 mg total) by mouth daily. 07/09/23   Nestor Ramp, MD  sacubitril-valsartan (ENTRESTO) 49-51 MG Take 1 tablet by mouth 2 (two) times daily.    [provider]  Vitamin D, Ergocalciferol, (DRISDOL) 1.25 MG (50000 UNIT) CAPS capsule Take 50,000 Units by mouth once a week. 03/31/23   [provider]  lisinopril (PRINIVIL,ZESTRIL) 40 MG tablet Take 40 mg by mouth 2 (two) times daily.    06/17/11  [provider]    Physical Exam: Constitutional: Moderately built and nourished. Vitals:   07/09/23 2300 07/10/23 0155 07/10/23 0300  BP: (!) 136/116 (!) 163/114   Pulse: 98 69   Resp: 18 (!) 22   Temp: 99.3 F (37.4 C)  98.2 F (36.8 C)  TempSrc: Oral  Oral  SpO2: 96% 94%    Eyes: Anicteric no pallor. ENMT: No discharge from the ears eyes nose or mouth. Neck: No mass felt.  No neck rigidity. Respiratory: No rhonchi or crepitations. Cardiovascular: S1-S2 heard. Abdomen: Soft nontender bowel sounds present. Musculoskeletal: No edema. Skin: No rash. Neurologic: Alert awake oriented to time place and person.  Moves all extremities. Psychiatric: Appears normal.  Normal affect.   Labs on Admission: I have personally reviewed following labs and imaging studies  CBC: Recent Labs  Lab 07/09/23 2325  WBC 11.7*  HGB 17.4*  HCT 51.2  MCV 88.6  PLT 230   Basic Metabolic Panel: Recent Labs  Lab 07/09/23 2325  NA 141  K 3.2*  CL 110  CO2 21*  GLUCOSE 163*  BUN 20  CREATININE 1.66*  CALCIUM 8.8*    GFR: CrCl cannot be calculated (Unknown ideal weight.). Liver Function Tests: Recent Labs  Lab 07/09/23 2325  AST 17  ALT 10  ALKPHOS 59  BILITOT 1.0  PROT 8.1  ALBUMIN 3.8   No results for input(s): "LIPASE", "AMYLASE" in the last 168 hours. No results for input(s): "AMMONIA" in the last 168 hours. Coagulation Profile: No results for input(s): "INR", "PROTIME" in the last 168 hours. Cardiac Enzymes: No results for input(s): "CKTOTAL", "CKMB", "CKMBINDEX", "TROPONINI" in the last 168 hours. BNP (last 3 results) No results for input(s): "PROBNP" in the last 8760 hours. HbA1C: No results for input(s): "HGBA1C" in the last 72 hours. CBG: No results for input(s): "GLUCAP" in the last 168 hours. Lipid Profile: No results for input(s): "CHOL", "HDL", "LDLCALC", "TRIG", "CHOLHDL", "LDLDIRECT" in the last 72 hours. Thyroid Function Tests: No results for input(s): "TSH", "T4TOTAL", "FREET4", "T3FREE", "THYROIDAB" in the last 72 hours. Anemia Panel: No results for input(s): "VITAMINB12", "FOLATE", "FERRITIN", "TIBC", "IRON", "RETICCTPCT" in the last 72 hours. Urine analysis:    Component Value Date/Time   COLORURINE YELLOW 07/03/2010 1022   APPEARANCEUR CLEAR 07/03/2010 1022   LABSPEC 1.011 07/03/2010 1022   PHURINE 5.5 07/03/2010 1022   GLUCOSEU NEGATIVE 07/03/2010 1022   HGBUR NEGATIVE 07/03/2010 1022   BILIRUBINUR NEG 04/01/2012 1020   KETONESUR NEGATIVE 07/03/2010 1022   PROTEINUR NEG 04/01/2012 1020   PROTEINUR NEGATIVE 07/03/2010 1022   UROBILINOGEN 0.2 04/01/2012 1020   UROBILINOGEN 0.2 07/03/2010 1022   NITRITE NEG 04/01/2012 1020   NITRITE NEGATIVE 07/03/2010 1022   LEUKOCYTESUR Negative 04/01/2012 1020   Sepsis Labs: @LABRCNTIP (procalcitonin:4,lacticidven:4) )No results found for this or any previous visit (from the past 240 hours).   Radiological Exams on Admission: DG Chest 2 View Result Date: 07/10/2023 CLINICAL DATA:  Chest pain EXAM: CHEST - 2 VIEW  COMPARISON:  06/24/2021 FINDINGS: Stable cardiomegaly. Left chest wall ICD. No focal consolidation, pleural effusion, or pneumothorax. No displaced rib fractures. IMPRESSION: No active cardiopulmonary disease. Electronically Signed   By: Minerva Fester M.D.   On: 07/10/2023 00:05    EKG: Independently reviewed.  A-fib rate controlled.  Assessment/Plan Principal Problem:   Chest pain Active Problems:   Obstructive sleep apnea   HYPERTENSION, BENIGN   RENAL DISEASE, CHRONIC, STAGE III   Type 2 diabetes mellitus with stage 4 chronic kidney disease, without long-term current use of insulin (HCC)   Blind in both eyes   Paroxysmal atrial fibrillation (HCC)   NICM (nonischemic cardiomyopathy) (HCC)   OBESITY, MORBID   Depression   Suicide ideation    Chest pain -   patient states his chest pain is off and on last few days present even at rest burning in sensation.  Presently chest pain-free.  Troponin is elevated but flat.  I have consulted patient's cardiologist Dr. Sharyn Lull will be seeing patient in consult.  Will continue with carvedilol, isosorbide mononitrate, as needed nitroglycerin statins patient did receive aspirin.  Check 2D echo and D-dimer. Nonischemic cardiomyopathy appears compensated.  Takes Entresto, isosorbide mononitrate, Farxiga, carvedilol. Depression with suicidal thoughts will consult psychiatry.  Suicide precautions.  Takes Seroquel. Diabetes mellitus type 2 on glargine insulin 25 units in the morning last hemoglobin A1c on January 2025 was 7.1. Paroxysmal atrial fibrillation on Eliquis and beta-blockers. Chronic kidney disease stage III creatinine is around baseline. Hypertension will continue with Entresto carvedilol isosorbide mononitrate. Blindness in both eyes. Polycythemia appears to be new.  Recheck CBC.  If still persistently elevated will have further workup.  Likely from cardiac cause. History of gout on allopurinol. Possible asthma history on  Dulera.  Patient has chest pain will  need further management and more than 2 midnight stay.   DVT prophylaxis: Eliquis. Code Status: Full code. Family Communication: Patient's wife. Disposition Plan: Telemetry. Consults called: Dr. Sharyn Lull cardiologist. Admission status: Observation.

## 2023-07-10 NOTE — Assessment & Plan Note (Signed)
 See notes above, flat trajectory of elevation, atypical chest discomfort all make plaque rupture extremely unlikely.

## 2023-07-10 NOTE — Assessment & Plan Note (Signed)
 Strict intake and output monitoring Creatinine near baseline Minimizing nephrotoxic agents as much as possible Serial chemistries to monitor renal function and electrolytes

## 2023-07-10 NOTE — Consult Note (Signed)
 Reason for Consult: Chest pain/minimally elevated high-sensitivity troponin I Referring Physician: [facility] hospitalist  [name] is an 63 y.o. male.  HPI: Patient is a 63 year old male with past medical history significant for multiple medical problems i.e. nonischemic dilated cardiomyopathy, hypertensive heart disease with systolic dysfunction, history of ventricular tachycardia in the past status post ICD, hypertension, hyperlipidemia, paroxysmal atrial fibrillation CHA2DS2-VASc score of 5 on chronic anticoagulation morbid obesity, type 2 diabetes mellitus, chronic kidney disease stage IIIb, obstructive sleep apnea, degenerative joint disease, glaucoma of the eyes, blindness both eyes, history of cataracts in the past, went to behavioral health because of hallucination and suicidal ideations and was transferred to [facility], ED because of vague chest discomfort off-and-on for last few days lasting few minutes without associated symptoms.  States pain lasted for few minutes it comes and goes denies any nausea and vomiting denies palpitation lightheadedness or syncope.  Denies PND orthopnea leg swelling presently denies any chest pain EKG done in the ED showed normal sinus rhythm with poor R wave progression no acute ischemic changes were noted however patient was noted to have minimally elevated high-sensitivity troponin I which is trending down.  Patient presently denies any suicidal ideations but complains of somebody trying to hurt him and could see through his peripheral vision.  Although he is mostly blind.  Patient had cardiac catheterization in the past which showed no significant coronary artery disease had nuclear stress test approximately 3 years ago which showed also no evidence of reversible ischemia with EF of 42%.   Past Medical History:  Diagnosis Date   Arthritis    Arthritis of knee, degenerative 10/03/2011   Right > LEFT KNEE oa X-rays were not standing but showed some mild  sclerosis and joint space loss of the medial compartment of the right knee. History of arthroscopy right knee. Report of injury as a child but unclear exactly what that was.  :I am aware of 12/01/18 Rx by Dr. Jennette Kettle.  While wife was hospitalized, narcotic Rx went missing when in-home sitter left.  To my knowledge, this is the first mi   Automatic implantable cardioverter-defibrillator in situ 07/15/2013   Blind in both eyes 06/15/2013   Right Sees Dr Dione Booze   Cataracts, bilateral    right   Chronic gout due to renal impairment of multiple sites without tophus 04/12/2020   Chronic systolic heart failure (HCC) 02/18/2007   Qualifier: Diagnosis of  By: Bryson Ha MD, Gwendalyn Ege    Congestive heart failure (HCC)    a. s/p MDT single chamber ICD    Diabetes mellitus    does not check blood sugars at home   Diverticulosis    Encounter for chronic pain management 05/17/2014   Indication for chronic opioid: severe arthritis RIGHTknee / low back Medication and dose: hydrocodone---uses prn # pills per month: gets #120 pills to last TWO months Last UDS date: 05/17/2014 Pain contract signed (Y/N): Y 05/17/2014 Date narcotic database last reviewed (include red flags): 05/17/2014    Glaucoma    bilateral   Glaucoma 11/07/2015   Severe See Dr. Dione Booze   H/O TIA (transient ischemic attack) and stroke    Hyperlipidemia    Hyperlipidemia with target low density lipoprotein (LDL) cholesterol less than 100 mg/dL 6/57/8469   Hypertension    HYPERTENSION, BENIGN 02/18/2007   Qualifier: Diagnosis of  By: Jennette Kettle MD, Ronald Lobo of both feet 04/12/2020   NICM (nonischemic cardiomyopathy) (HCC) 05/30/2015   Obesity  OBESITY, MORBID 05/21/2010   Qualifier: Diagnosis of  By: Graciela Husbands, MD, Susie Cassette    Obstructive sleep apnea 03/27/2009   Qualifier: Diagnosis of  By: Sherral Hammers, RN, BSN, Melanie     Paroxysmal atrial fibrillation (HCC) 09/21/2019   Paroxysmal VT (HCC) 06/22/2019   RENAL DISEASE, CHRONIC, STAGE III  02/18/2007   SHOULDER PAIN, LEFT 08/01/2009   History of left shoulder surgery.     Single implantable cardioverter-defibrillator (ICD) in situ 06/17/2011   Type 2 diabetes mellitus with stage 4 chronic kidney disease, without long-term current use of insulin (HCC) 08/25/2007    Past Surgical History:  Procedure Laterality Date   CARDIAC CATHETERIZATION  10/24/2003   EF of 45-50%   CARDIAC DEFIBRILLATOR PLACEMENT  2008   CATARACT EXTRACTION  2012   right   EP IMPLANTABLE DEVICE N/A 05/30/2015   Procedure: ICD Generator Changeout;  Surgeon: Duke Salvia, MD;  Location: [facility] INVASIVE CV LAB;  Service: Cardiovascular;  Laterality: N/A;   KNEE SURGERY Right 2000   SHOULDER SURGERY  2012   left    Family History  Problem Relation Age of Onset   Colon cancer Father    Esophageal cancer Neg Hx    Rectal cancer Neg Hx    Stomach cancer Neg Hx     Social History:  reports that he has never smoked. He has never used smokeless tobacco. He reports that he does not drink alcohol and does not use drugs.  Allergies:  Allergies  Allergen Reactions   Bee Pollen Anaphylaxis, Itching and Swelling   Bee Venom Anaphylaxis   Shellfish Allergy Anaphylaxis   Nsaids Other (See Comments)    CKD and CHF    Medications: I have reviewed the patient's current medications.  Results for orders placed or performed during the hospital encounter of 07/09/23 (from the past 48 hours)  Comprehensive metabolic panel     Status: Abnormal   Collection Time: 07/09/23 11:25 PM  Result Value Ref Range   Sodium 141 135 - 145 mmol/L   Potassium 3.2 (L) 3.5 - 5.1 mmol/L   Chloride 110 98 - 111 mmol/L   CO2 21 (L) 22 - 32 mmol/L   Glucose, Bld 163 (H) 70 - 99 mg/dL    Comment: Glucose reference range applies only to samples taken after fasting for at least 8 hours.   BUN 20 8 - 23 mg/dL   Creatinine, Ser 0.98 (H) 0.61 - 1.24 mg/dL   Calcium 8.8 (L) 8.9 - 10.3 mg/dL   Total Protein 8.1 6.5 - 8.1 g/dL   Albumin  3.8 3.5 - 5.0 g/dL   AST 17 15 - 41 U/L   ALT 10 0 - 44 U/L   Alkaline Phosphatase 59 38 - 126 U/L   Total Bilirubin 1.0 0.0 - 1.2 mg/dL   GFR, Estimated 46 (L) >60 mL/min    Comment: (NOTE) Calculated using the CKD-EPI Creatinine Equation (2021)    Anion gap 10 5 - 15    Comment: Performed at [facility], [address], [place], [place] [address]  Ethanol     Status: None   Collection Time: 07/09/23 11:25 PM  Result Value Ref Range   Alcohol, Ethyl (B) <10 <10 mg/dL    Comment: (NOTE) Lowest detectable limit for serum alcohol is 10 mg/dL.  For medical purposes only. Performed at [facility], [address] [place], [place] [address]   Salicylate level     Status: Abnormal  Collection Time: 07/09/23 11:25 PM  Result Value Ref Range   Salicylate Lvl <7.0 (L) 7.0 - 30.0 mg/dL    Comment: Performed at [facility], [address] [place], [place] [address]  Acetaminophen level     Status: Abnormal   Collection Time: 07/09/23 11:25 PM  Result Value Ref Range   Acetaminophen (Tylenol), Serum <10 (L) 10 - 30 ug/mL    Comment: (NOTE) Therapeutic concentrations vary significantly. A range of 10-30 ug/mL  may be an effective concentration for many patients. However, some  are best treated at concentrations outside of this range. Acetaminophen concentrations >150 ug/mL at 4 hours after ingestion  and >50 ug/mL at 12 hours after ingestion are often associated with  toxic reactions.  Performed at [facility], [address], [place], [place] [address]   cbc     Status: Abnormal   Collection Time: 07/09/23 11:25 PM  Result Value Ref Range   WBC 11.7 (H) 4.0 - 10.5 K/uL   RBC 5.78 4.22 - 5.81 MIL/uL   Hemoglobin 17.4 (H) 13.0 - 17.0 g/dL   HCT 03.4 74.2 - 59.5 %   MCV 88.6 80.0 - 100.0 fL   MCH 30.1 26.0 - 34.0 pg   MCHC 34.0 30.0 - 36.0 g/dL   RDW 63.8 75.6 - 43.3 %   Platelets 230 150 - 400  K/uL   nRBC 0.0 0.0 - 0.2 %    Comment: Performed at [facility], [address], [place], [place] [address]  Troponin I (High Sensitivity)     Status: Abnormal   Collection Time: 07/09/23 11:25 PM  Result Value Ref Range   Troponin I (High Sensitivity) 82 (H) <18 ng/L    Comment: (NOTE) Elevated high sensitivity troponin I (hsTnI) values and significant  changes across serial measurements may suggest ACS but many other  chronic and acute conditions are known to elevate hsTnI results.  Refer to the "Links" section for chest pain algorithms and additional  guidance. Performed at [facility], [address], [place], [place] [address]   Troponin I (High Sensitivity)     Status: Abnormal   Collection Time: 07/10/23  1:09 AM  Result Value Ref Range   Troponin I (High Sensitivity) 89 (H) <18 ng/L    Comment: (NOTE) Elevated high sensitivity troponin I (hsTnI) values and significant  changes across serial measurements may suggest ACS but many other  chronic and acute conditions are known to elevate hsTnI results.  Refer to the "Links" section for chest pain algorithms and additional  guidance. Performed at [facility], [address] [place], [place] [address]   Basic metabolic panel     Status: Abnormal   Collection Time: 07/10/23  5:26 AM  Result Value Ref Range   Sodium 143 135 - 145 mmol/L   Potassium 3.2 (L) 3.5 - 5.1 mmol/L   Chloride 110 98 - 111 mmol/L   CO2 23 22 - 32 mmol/L   Glucose, Bld 143 (H) 70 - 99 mg/dL    Comment: Glucose reference range applies only to samples taken after fasting for at least 8 hours.   BUN 19 8 - 23 mg/dL   Creatinine, Ser 6.01 (H) 0.61 - 1.24 mg/dL   Calcium 8.1 (L) 8.9 - 10.3 mg/dL   GFR, Estimated 46 (L) >60 mL/min    Comment: (NOTE) Calculated using the CKD-EPI Creatinine Equation (2021)    Anion gap 10 5 - 15  Comment: Performed at [facility], [address] [place], [place] [address]  Hepatic function panel     Status: Abnormal   Collection Time: 07/10/23  5:26 AM  Result Value Ref Range   Total Protein 7.1 6.5 - 8.1 g/dL   Albumin 3.1 (L) 3.5 - 5.0 g/dL   AST 14 (L) 15 - 41 U/L   ALT 11 0 - 44 U/L   Alkaline Phosphatase 51 38 - 126 U/L   Total Bilirubin 1.2 0.0 - 1.2 mg/dL   Bilirubin, Direct 0.2 0.0 - 0.2 mg/dL   Indirect Bilirubin 1.0 (H) 0.3 - 0.9 mg/dL    Comment: Performed at [facility], [address] [place], [place] [address]  CBC with Differential/Platelet     Status: Abnormal   Collection Time: 07/10/23  5:26 AM  Result Value Ref Range   WBC 9.5 4.0 - 10.5 K/uL   RBC 4.49 4.22 - 5.81 MIL/uL   Hemoglobin 13.1 13.0 - 17.0 g/dL   HCT 51.8 84.1 - 66.0 %   MCV 88.6 80.0 - 100.0 fL   MCH 29.2 26.0 - 34.0 pg   MCHC 32.9 30.0 - 36.0 g/dL   RDW 63.0 16.0 - 10.9 %   Platelets 184 150 - 400 K/uL   nRBC 0.0 0.0 - 0.2 %   Neutrophils Relative % 64 %   Neutro Abs 6.0 1.7 - 7.7 K/uL   Lymphocytes Relative 24 %   Lymphs Abs 2.3 0.7 - 4.0 K/uL   Monocytes Relative 11 %   Monocytes Absolute 1.1 (H) 0.1 - 1.0 K/uL   Eosinophils Relative 1 %   Eosinophils Absolute 0.1 0.0 - 0.5 K/uL   Basophils Relative 0 %   Basophils Absolute 0.0 0.0 - 0.1 K/uL   Immature Granulocytes 0 %   Abs Immature Granulocytes 0.04 0.00 - 0.07 K/uL    Comment: Performed at [facility], [address], [place], [place] [address]  Troponin I (High Sensitivity)     Status: Abnormal   Collection Time: 07/10/23  5:26 AM  Result Value Ref Range   Troponin I (High Sensitivity) 79 (H) <18 ng/L    Comment: (NOTE) Elevated high sensitivity troponin I (hsTnI) values and significant  changes across serial measurements may suggest ACS but many other  chronic and acute conditions are known to elevate hsTnI results.  Refer to the "Links" section for chest pain algorithms and additional  guidance. Performed at [facility], [address] [place], [place] [address]   D-dimer, quantitative     Status: None   Collection Time: 07/10/23  5:26 AM  Result Value Ref Range   D-Dimer, Quant 0.28 0.00 - 0.50 ug/mL-FEU    Comment: (NOTE) At the manufacturer cut-off value of 0.5 g/mL FEU, this assay has a negative predictive value of 95-100%.This assay is intended for use in conjunction with a clinical pretest probability (PTP) assessment model to exclude pulmonary embolism (PE) and deep venous thrombosis (DVT) in outpatients suspected of PE or DVT. Results should be correlated with clinical presentation. Performed at [facility], [address], [place], [place] [address]    *Note: Due to a large number of results and/or encounters for the requested time period, some results have not been displayed. A complete set of results can be found in Results Review.    DG Chest 2 View Result Date: 07/10/2023 CLINICAL DATA:  Chest pain EXAM: CHEST - 2 VIEW  COMPARISON:  06/24/2021 FINDINGS: Stable cardiomegaly. Left chest wall ICD. No focal consolidation, pleural effusion, or pneumothorax. No displaced rib fractures. IMPRESSION: No active cardiopulmonary disease. Electronically Signed   By: Minerva Fester M.D.   On: 07/10/2023 00:05    Review of Systems  Constitutional:  Negative for chills, fatigue and fever.  HENT:  Negative for sore throat.   Eyes:  Negative for pain.  Respiratory:  Negative for cough and chest tightness.   Cardiovascular:  Negative for chest pain, palpitations and leg swelling.  Gastrointestinal:  Negative for abdominal distention and abdominal pain.  Neurological:  Negative for dizziness.  Psychiatric/Behavioral:  Positive for hallucinations.    Blood pressure (!) 140/91, pulse 72, temperature 98.1 F (36.7 C), temperature source Oral, resp. rate 20, height 5\' 2"  (1.575 m), weight (!) 143 kg, SpO2 95%. Physical Exam Constitutional:      Appearance:  Normal appearance.  HENT:     Head: Normocephalic and atraumatic.  Eyes:     Conjunctiva/sclera: Conjunctivae normal.  Cardiovascular:     Rate and Rhythm: Normal rate and regular rhythm.     Heart sounds: Murmur (2/6 systolic murmur noted) heard.  Pulmonary:     Effort: Pulmonary effort is normal. No respiratory distress.     Breath sounds: Normal breath sounds. No wheezing or rhonchi.  Abdominal:     General: There is distension.     Palpations: Abdomen is soft. There is no mass.     Tenderness: There is no abdominal tenderness.  Musculoskeletal:     Cervical back: Normal range of motion and neck supple.  Skin:    General: Skin is warm and dry.  Neurological:     General: No focal deficit present.     Mental Status: He is alert and oriented to person, place, and time.     Assessment/Plan: Noncardiac chest pain Minimally elevated high-sensitivity troponin I due to demand ischemia and doubt significant MI Nonischemic cardiomyopathy Hypertensive heart disease with systolic dysfunction History of VT in the past status post ICD Paroxysmal atrial fibrillation CHA2DS2-VASc score of 5 on chronic anticoagulation Uncontrolled hypertension Diabetes mellitus Hyperlipidemia Chronic kidney disease stage IIIb Morbid obesity Obstructive sleep apnea History of glaucoma Blindness in both eyes Depression suicidal ideation/hallucination Plan Continue with home medications Check serial enzymes if trending down as they are okay to transfer to behavioral health as appropriate No further cardiac workup   Rinaldo Cloud 07/10/2023, 8:20 AM

## 2023-07-10 NOTE — Plan of Care (Signed)

## 2023-07-10 NOTE — ED Provider Notes (Incomplete)
 Behavioral Health Urgent Care Medical Screening Exam  Patient Name: [name] MRN: [id number] Date of Evaluation: 07/09/23 Chief Complaint:   Diagnosis:  Final diagnoses:  Suicidal ideation  Severe episode of recurrent major depressive disorder, with psychotic features (HCC)  Hallucinations, visual    History of Present illness: [name] is a 63 y.o. male. ***  Flowsheet Row ED from 07/09/2023 in [facility] ED from 06/23/2023 in [facility] Emergency Department at [facility] ED from 09/13/2021 in [facility] Emergency Department at [facility]  C-SSRS RISK CATEGORY No Risk No Risk No Risk       Psychiatric Specialty Exam  Presentation  General Appearance:Casual  Eye Contact:None (Pt is blind)  Speech:Clear and Coherent  Speech Volume:Normal  Handedness:Right   Mood and Affect  Mood: Depressed; Anxious  Affect: Congruent; Blunt   Thought Process  Thought Processes: Coherent; Goal Directed  Descriptions of Associations:Intact  Orientation:Full (Time, Place and Person)  Thought Content:WDL  Diagnosis of Schizophrenia or Schizoaffective disorder in past: No data recorded  Hallucinations:Visual Pt reports seeing different  lights and people.  Ideas of Reference:None  Suicidal Thoughts:Yes, Active Without Plan  Homicidal Thoughts:No   Sensorium  Memory: Immediate Fair  Judgment: Poor  Insight: Shallow   Executive Functions  Concentration: Good  Attention Span: Good  Recall: Good  Fund of Knowledge: Good  Language: Good   Psychomotor Activity  Psychomotor Activity: Normal   Assets  Assets: Communication Skills; Desire for Improvement; Social Support   Sleep  Sleep: Good  Number of hours: No data recorded  Physical Exam: Physical Exam Constitutional:      General: He is not in acute distress.    Appearance: He is obese. He is not diaphoretic.  HENT:      Head: Normocephalic.     Right Ear: External ear normal.     Left Ear: External ear normal.     Nose: No congestion.  Pulmonary:     Effort: No respiratory distress.  Neurological:     Mental Status: He is alert and oriented to person, place, and time.  Psychiatric:        Attention and Perception: He perceives visual hallucinations.        Mood and Affect: Mood is anxious and depressed. Affect is flat.        Speech: Speech normal.        Behavior: Behavior is cooperative.        Thought Content: Thought content includes suicidal ideation. Thought content does not include suicidal plan.    Review of Systems  Constitutional:  Negative for chills, diaphoresis and fever.  HENT:  Negative for congestion.   Eyes:  Negative for discharge.  Respiratory:  Negative for cough, shortness of breath and wheezing.   Cardiovascular:  Negative for chest pain and palpitations.  Gastrointestinal:  Negative for diarrhea, nausea and vomiting.  Neurological:  Negative for dizziness and weakness.  Psychiatric/Behavioral:  Positive for depression, hallucinations and suicidal ideas.    Blood pressure 135/73, pulse (!) 107, temperature 98.2 F (36.8 C), temperature source Oral, resp. rate 18, SpO2 93%. There is no height or weight on file to calculate BMI.  Musculoskeletal: Strength & Muscle Tone: decreased Gait & Station: {PE GAIT ED NATL:22525} Patient leans: {Patient Leans:[id number]}   [facility] MSE Discharge Disposition for Follow up and Recommendations: {[facility] MSE Recommendations:24277}   Adam Lopresti Royston Bake, NP 07/09/2023, 10:04 PM

## 2023-07-10 NOTE — Assessment & Plan Note (Signed)
 Euvolemic Continue Sherryll Burger

## 2023-07-10 NOTE — Progress Notes (Signed)
 PROGRESS NOTE   [name]  QVZ:[id number] DOB: 1960/10/12 DOA: 07/09/2023 PCP: Nestor Ramp, MD   Date of Service: the patient was seen and examined on 07/10/2023  Brief Narrative:  63 y.o. male with history of nonischemic cardiomyopathy, diabetes mellitus type 2, chronic kidney disease stage III, hypertension, paroxysmal atrial fibrillation, blindness in both eyes had originally come to behavioral health after patient's family noticed that patient has been complaining of suicidal thoughts and also has been recently having visual hallucinations.  Upon evaluation at behavioral health patient was also found to be complaining of chest pain and was brought to [facility] emergency department for further evaluation.  In the emergency department, the hospital was called to see the patient for admission the hospital for chest pain workup.  Patient was placed on suicide precautions.  Admitting provider discussed with patient's cardiologist Dr. Sharyn Lull who agreed to see the patient following the in consultation.     Assessment & Plan Chest pain Atypical chest discomfort that seems to have resolved This has been associated with slight elevation of troponin with flat trajectory of elevation making plaque rupture unlikely No associated dynamic ST segment change based on review of EKG Patient has been evaluated by cardiology who feels that ACS/ischemia is unlikely.  Cardiology is signing off and clearing from a cardiology standpoint. Workup for other etiologies of chest discomfort has been negative with normal D-dimer and chest x-ray. Suicidal ideation Patient continues to report suicidal ideation Patient gets defensive when asking him further questions such as inciting events or detail plan Psychiatry following along, recommending transfer to behavioral health Hospital for continued care once medically cleared.  Will notify psychiatry of patient's medical clearance so we can proceed with transferring  upon bed availability. Elevated troponin level not due myocardial infarction See notes above, flat trajectory of elevation, atypical chest discomfort all make plaque rupture extremely unlikely. Type 2 diabetes mellitus with stage 3b chronic kidney disease, with long-term current use of insulin (HCC) Resume home regimen of insulin Accu-Cheks before every meal and nightly with sliding scale insulin Continue farxiga Hemoglobin A1c 7.1% 04/22/2023  Chronic kidney disease, stage 3a (HCC) Strict intake and output monitoring Creatinine near baseline Minimizing nephrotoxic agents as much as possible Serial chemistries to monitor renal function and electrolytes  Essential Hypertension Continue Coreg, Entresto Paroxysmal atrial fibrillation (HCC) Rate controlled Patient currently in controlled normal sinus rhythm Continue home regimen of Eliquis and Coreg NICM (nonischemic cardiomyopathy) (HCC) Euvolemic Continue Entresto     Subjective:  Patient reports that he is currently chest pain-free.  Patient continues to complain of suicidal ideation saying that he wishes to hurt himself and end his life.  Patient is unwilling to provide additional detail.  Physical Exam:  Vitals:   07/10/23 0529 07/10/23 0600 07/10/23 0601 07/10/23 0646  BP:  123/77    Pulse:  71 65 74  Resp:  18 15 19   Temp:      TempSrc:      SpO2:  92% 90% 95%  Weight: (!) 143 kg     Height: 5\' 2"  (1.575 m)       Constitutional: Awake alert and oriented x3, no associated distress.   Skin: no rashes, no lesions, good skin turgor noted. Eyes: Eyes closed throughout interview. ENMT: Moist mucous membranes noted.  Posterior pharynx clear of any exudate or lesions.   Respiratory: clear to auscultation bilaterally, no wheezing, no crackles. Normal respiratory effort. No accessory muscle use.  Cardiovascular: Regular rate with irregularly  irregular rhythm.  No murmurs / rubs / gallops. No extremity edema. 2+ pedal pulses.  No carotid bruits.  Abdomen: Abdomen is soft and nontender.  No evidence of intra-abdominal masses.  Positive bowel sounds noted in all quadrants.   Musculoskeletal: No joint deformity upper and lower extremities. Good ROM, no contractures. Normal muscle tone.  Psych: Flat affect.  Withdrawn.  Admits to ongoing suicidal ideation.  Unwilling to provide further detail.   Data Reviewed:  I have personally reviewed and interpreted labs, imaging.  Significant findings are   CBC: Recent Labs  Lab 07/09/23 2325 07/10/23 0526  WBC 11.7* 9.5  NEUTROABS  --  6.0  HGB 17.4* 13.1  HCT 51.2 39.8  MCV 88.6 88.6  PLT 230 184   Basic Metabolic Panel: Recent Labs  Lab 07/09/23 2325 07/10/23 0526  NA 141 143  K 3.2* 3.2*  CL 110 110  CO2 21* 23  GLUCOSE 163* 143*  BUN 20 19  CREATININE 1.66* 1.65*  CALCIUM 8.8* 8.1*   GFR: Estimated Creatinine Clearance: 57.6 mL/min (A) (by C-G formula based on SCr of 1.65 mg/dL (H)). Liver Function Tests: Recent Labs  Lab 07/09/23 2325 07/10/23 0526  AST 17 14*  ALT 10 11  ALKPHOS 59 51  BILITOT 1.0 1.2  PROT 8.1 7.1  ALBUMIN 3.8 3.1*    Coagulation Profile: No results for input(s): "INR", "PROTIME" in the last 168 hours.   EKG: Personally reviewed.  Rhythm is atrial fibrillation with heart rate of 85 bpm.  No dynamic ST segment changes appreciated.   Code Status:  Full code.  Code status decision has been confirmed with: patient    Severity of Illness:  The appropriate patient status for this patient is OBSERVATION. Observation status is judged to be reasonable and necessary in order to provide the required intensity of service to ensure the patient's safety. The patient's presenting symptoms, physical exam findings, and initial radiographic and laboratory data in the context of their medical condition is felt to place them at decreased risk for further clinical deterioration. Furthermore, it is anticipated that the patient will be  medically stable for discharge from the hospital within 2 midnights of admission.   Time spent:  50 minutes  Author:  Marinda Elk MD  07/10/2023 7:43 AM

## 2023-07-10 NOTE — Consult Note (Signed)
 [facility] Psychiatric Consult Initial  Patient Name: .[name]  MRN: [id number]  DOB: May 05, 1960  Consult Order details:  Orders (From admission, onward)     Start     Ordered   07/10/23 0540  IP CONSULT TO PSYCHIATRY       Ordering Provider: Eduard Clos, MD  Provider:  (Not yet assigned)  Question Answer Comment  Location [facility]   Reason for Consult? Please consult for suicidal thoughts.      07/10/23 0539             Mode of Visit: In person    Psychiatry Consult Evaluation  Service Date: July 10, 2023 LOS:  LOS: 0 days  Chief Complaint "I was seeing people and bright lights".  Primary Psychiatric Diagnoses  Visual hallucinations   Assessment  [name] is a 63 y.o. male admitted: Medicallyfor 07/09/2023 10:57 PM for chest pain. He carries the psychiatric diagnoses of VH for the past 3-4 years and has a past medical history of  nonischemic cardiomyopathy, diabetes mellitus type 2, chronic kidney disease stage III, hypertension, paroxysmal atrial fibrillation, blindness in both eyes .   His current presentation of seeing people and bright lights despite being blind is most consistent with visual hallucinations. He meets criteria for visual hallucinations based on seeing people and bright lights for the past 3-4 years with paranoia that these people are after him.  Current outpatient psychotropic medications include Serqouel and historically he has had a non response recently to these medications. He was compliant with medications prior to admission as evidenced by self-report. On initial examination, patient was not having hallucinations as "I haven't been outside", positive for hallucinations predominately when outside, sometimes within his home. Please see plan below for detailed recommendations.   Diagnoses:  Active Hospital problems: Principal Problem:   Chest pain Active Problems:   Visual hallucinations    Obstructive sleep apnea   Essential Hypertension   Chronic kidney disease, stage 3a (HCC)   Type 2 diabetes mellitus with stage 3b chronic kidney disease, with long-term current use of insulin (HCC)   OBESITY, MORBID   Blind in both eyes   NICM (nonischemic cardiomyopathy) (HCC)   Paroxysmal atrial fibrillation (HCC)   Suicidal ideation   Elevated troponin level not due myocardial infarction    Plan   ## Psychiatric Medication Recommendations:  -Discontinued the Seroquel 150 mg at bedttime -Started Risperdal 0.5 mg BID  ## Medical Decision Making Capacity: Not specifically addressed in this encounter  ## Further Work-up:  -- most recent EKG on 4/4  had QtC of 458 -- Pertinent labwork reviewed earlier this admission includes: CBC with diff, troponin, chem panel, HIV test, toxicology   ## Disposition:-- We recommend inpatient psychiatric hospitalization when medically cleared. Patient is under voluntary admission status at this time; please IVC if attempts to leave hospital.  ## Behavioral / Environmental: - No specific recommendations at this time.     ## Safety and Observation Level:  - Based on my clinical evaluation, I estimate the patient to be at moderate risk of self harm in the current setting. - At this time, we recommend  1:1 Observation. This decision is based on my review of the chart including patient's history and current presentation, interview of the patient, mental status examination, and consideration of suicide risk including evaluating suicidal ideation, plan, intent, suicidal or self-harm behaviors, risk factors, and protective factors. This judgment is based on our ability to directly  address suicide risk, implement suicide prevention strategies, and develop a safety plan while the patient is in the clinical setting. Please contact our team if there is a concern that risk level has changed.  CSSR Risk Category:C-SSRS RISK CATEGORY: High Risk  Suicide Risk  Assessment: Patient has following modifiable risk factors for suicide: active suicidal ideation and untreated depression, which we are addressing by starting medications and seeing inpatient hospitalization unless symptoms resolve prior to medical clearance. Patient has following non-modifiable or demographic risk factors for suicide: male gender and history of suicide attempt Patient has the following protective factors against suicide: Supportive family and no history of NSSIB  Thank you for this consult request. Recommendations have been communicated to the primary team.  We will continue to follow at this time.   Adam Means, NP       History of Present Illness  Relevant Aspects of [facility] Course:  Admitted on 07/09/2023 for chest pain. They are currently being followed by cardiology.   Patient Report:  63 yo male with nonischemic cardiomyopathy, diabetes mellitus type 2, chronic kidney disease stage III, hypertension, paroxysmal atrial fibrillation, blindness in both eyes, and visual hallucinations admitted for chest pain after transferring from the [facility] where he presented with [facility] and suicidal ideations with a plan to end his life.  On assessment, he reported he has been seeing "people and bright lights" despite being blind except for the ability to see shadows out of the sides of his eyes.  Denies hallucinations since admission as "I haven't been outside".  When inquired about this, he stated he usually has them constantly while outside and sometime inside.  He was seeing people following him on bicycles with these people and bikes being on the porch and yard that his son or daughter saw.  Sometimes he sees the people inside the home.  Prior to admission, "I got fed up with it" and wanted to end his life as these have been occurring for the past 3-4 years.  He fears these people are trying to harm him but they never say anything.  Per the notes, it stated the client was walking around  the house at times carrying a knife threatening to harm himself.  He admitted to ano overdose on sleep pills in 2008 or 2009 after he "got fed up with people not being nice to me when I was always nice to them".  He went to the hospital but was not admitted to inpatient psychiatry, no other attempts or admissions.  Currently, his depression is moderate and anxiety is moderate to high.  His depression gets "real bad" with the hallucinations.  He is currently living with his son, Maxten Shuler., and gave permission for collateral (unable to reach him, will continue to try).  "He think I'm putting on".  Denies trauma, and abuse, past and present.  He was one of 9 children with his father, brother, and sister having substance abuse issues.  This is the reason he does not use any substances.    Psych ROS:  Depression: moderate Anxiety:  moderate to high Mania (lifetime and current): denied Psychosis: (lifetime and current): AH  Collateral information:  Continuing to reach his son who he lives with  Review of Systems  Psychiatric/Behavioral:  Positive for depression.   All other systems reviewed and are negative.    Psychiatric and Social History  Psychiatric History:  Information collected from patient and chart.  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 rate 20, height 5\' 2"  (1.575 m), weight (!) 143 kg, SpO2 97%. Body mass index is 57.66 kg/m.  Physical Exam  Mental Status Exam: General Appearance: Casual  Orientation:  Full (Time, Place, and Person)  Memory:  Immediate;   Good Recent;   Good Remote;   Good  Concentration:  Concentration: Good and Attention Span: Good  Recall:  Good  Attention  Good  Eye Contact:  poor, the client is bline  Speech:  Clear and Coherent  Language:  Good  Volume:  Normal  Mood: depression and anxiety  Affect:  Blunt  Thought Process:  Coherent  Thought Content:  Hallucinations: Visual prior to hospitalization  Suicidal Thoughts:  No  Homicidal Thoughts:  No  Judgement:  Fair  Insight:  Fair  Psychomotor Activity:  Decreased  Akathisia:  No  Fund of Knowledge:  Good      Assets:  Housing Leisure Time Resilience Social Support  Cognition:  WNL  ADL's:  Intact  AIMS (if indicated):        Other History   These have been pulled in through the EMR, reviewed, and updated if appropriate.  Family History:  The patient's family history includes Colon cancer in his father.  Medical History: Past Medical History:  Diagnosis Date   Arthritis    Arthritis of knee, degenerative 10/03/2011   Right > LEFT KNEE oa X-rays were not standing but showed some mild sclerosis and joint space loss of the medial compartment of the right knee. History of arthroscopy right knee. Report of injury as a child but unclear exactly what that was.  :I am aware of 12/01/18 Rx by Dr. Jennette Kettle.  While wife was hospitalized, narcotic Rx went missing when in-home sitter left.  To my knowledge, this is the first mi   Automatic implantable  cardioverter-defibrillator in situ 07/15/2013   Blind in both eyes 06/15/2013   Right Sees Dr Dione Booze   Cataracts, bilateral    right   Chronic gout due to renal impairment of multiple sites without tophus 04/12/2020   Chronic systolic heart failure (HCC) 02/18/2007   Qualifier: Diagnosis of  By: Bryson Ha MD, Gwendalyn Ege    Congestive heart failure (HCC)    a. s/p MDT single chamber ICD    Diabetes mellitus    does not check blood sugars at home   Diverticulosis    Encounter for chronic pain management 05/17/2014   Indication for chronic opioid: severe arthritis RIGHTknee / low back Medication and dose: hydrocodone---uses prn # pills per month: gets #120 pills to last TWO months Last UDS date:  05/17/2014 Pain contract signed (Y/N): Y 05/17/2014 Date narcotic database last reviewed (include red flags): 05/17/2014    Glaucoma    bilateral   Glaucoma 11/07/2015   Severe See Dr. Dione Booze   H/O TIA (transient ischemic attack) and stroke    Hyperlipidemia    Hyperlipidemia with target low density lipoprotein (LDL) cholesterol less than 100 mg/dL 9/56/2130   Hypertension    HYPERTENSION, BENIGN 02/18/2007   Qualifier: Diagnosis of  By: Jennette Kettle MD, Ronald Lobo of both feet 04/12/2020   NICM (nonischemic cardiomyopathy) (HCC) 05/30/2015   Obesity    OBESITY, MORBID 05/21/2010   Qualifier: Diagnosis of  By: Graciela Husbands, MD, Susie Cassette    Obstructive sleep apnea 03/27/2009   Qualifier: Diagnosis of  By: Sherral Hammers, RN, BSN, Melanie     Paroxysmal atrial fibrillation (HCC) 09/21/2019   Paroxysmal VT (HCC) 06/22/2019   RENAL DISEASE, CHRONIC, STAGE III 02/18/2007   SHOULDER PAIN, LEFT 08/01/2009   History of left shoulder surgery.     Single implantable cardioverter-defibrillator (ICD) in situ 06/17/2011   Type 2 diabetes mellitus with stage 4 chronic kidney disease, without long-term current use of insulin (HCC) 08/25/2007    Surgical History: Past Surgical History:  Procedure Laterality Date   CARDIAC  CATHETERIZATION  10/24/2003   EF of 45-50%   CARDIAC DEFIBRILLATOR PLACEMENT  2008   CATARACT EXTRACTION  2012   right   EP IMPLANTABLE DEVICE N/A 05/30/2015   Procedure: ICD Generator Changeout;  Surgeon: Duke Salvia, MD;  Location: [facility] INVASIVE CV LAB;  Service: Cardiovascular;  Laterality: N/A;   KNEE SURGERY Right 2000   SHOULDER SURGERY  2012   left     Medications:   Current Facility-Administered Medications:    acetaminophen (TYLENOL) tablet 650 mg, 650 mg, Oral, Q4H PRN, Shalhoub, Deno Lunger, MD, 650 mg at 07/10/23 1149   allopurinol (ZYLOPRIM) tablet 50 mg, 50 mg, Oral, Daily, Eduard Clos, MD, 50 mg at 07/10/23 1148   apixaban (ELIQUIS) tablet 5 mg, 5 mg, Oral, BID, Eduard Clos, MD, 5 mg at 07/10/23 1149   brimonidine (ALPHAGAN) 0.2 % ophthalmic solution 1 drop, 1 drop, Both Eyes, BID, Eduard Clos, MD   carvedilol (COREG) tablet 25 mg, 25 mg, Oral, BID, Eduard Clos, MD, 25 mg at 07/10/23 1109   dapagliflozin propanediol (FARXIGA) tablet 10 mg, 10 mg, Oral, Daily, Eduard Clos, MD, 10 mg at 07/10/23 1149   dorzolamide-timolol (COSOPT) 2-0.5 % ophthalmic solution 1 drop, 1 drop, Both Eyes, BID, Eduard Clos, MD   insulin glargine-yfgn (SEMGLEE) injection 25 Units, 25 Units, Subcutaneous, Daily, Eduard Clos, MD   isosorbide mononitrate (ISMO) tablet 20 mg, 20 mg, Oral, BID, Kakrakandy, Arshad N, MD   latanoprost (XALATAN) 0.005 % ophthalmic solution 1 drop, 1 drop, Both Eyes, QPM, Kakrakandy, Meryle Ready, MD   mometasone-formoterol (DULERA) 200-5 MCG/ACT inhaler 2 puff, 2 puff, Inhalation, Daily, Eduard Clos, MD   nitroGLYCERIN (NITROSTAT) SL tablet 0.4 mg, 0.4 mg, Sublingual, Q5 min PRN, Eduard Clos, MD   risperiDONE (RISPERDAL) tablet 0.5 mg, 0.5 mg, Oral, BID, Kylynn Street Y, NP   rosuvastatin (CRESTOR) tablet 10 mg, 10 mg, Oral, Daily, Midge Minium N, MD, 10 mg at 07/10/23 1148    sacubitril-valsartan (ENTRESTO) 49-51 mg per tablet, 1 tablet, Oral, BID, Eduard Clos, MD, 1 tablet at 07/10/23 1149  Allergies: Allergies  Allergen Reactions   Bee Pollen Anaphylaxis, Itching and Swelling  Bee Venom Anaphylaxis   Shellfish Allergy Anaphylaxis   Nsaids Other (See Comments)    CKD and CHF    Adam Means, NP

## 2023-07-10 NOTE — ED Notes (Signed)
 Per Floor RN unable to accept patient until sitter is present. Charge RN informed.

## 2023-07-10 NOTE — Care Management Obs Status (Signed)
 MEDICARE OBSERVATION STATUS NOTIFICATION   Patient Details  Name: [name] MRN: [id number] Date of Birth: 1960-11-19   Medicare Observation Status Notification Given:  Yes    Beckie Busing, RN 07/10/2023, 3:48 PM

## 2023-07-10 NOTE — Assessment & Plan Note (Signed)
 Atypical chest discomfort that seems to have resolved This has been associated with slight elevation of troponin with flat trajectory of elevation making plaque rupture unlikely No associated dynamic ST segment change based on review of EKG Patient has been evaluated by cardiology who feels that ACS/ischemia is unlikely.  Cardiology is signing off and clearing from a cardiology standpoint. Workup for other etiologies of chest discomfort has been negative with normal D-dimer and chest x-ray.

## 2023-07-10 NOTE — Progress Notes (Signed)
 Patient's  wife called demanding to be updated on his progress stating patient can't speak for himself, this nurse explained to her what has been done so far.  She demanded to speak to the  doctor, Dr Leafy Half informed, and confirmed he was going to call the Wife. Will continue to follow.

## 2023-07-10 NOTE — Assessment & Plan Note (Signed)
 Patient continues to report suicidal ideation Patient gets defensive when asking him further questions such as inciting events or detail plan Psychiatry following along, recommending transfer to behavioral health Hospital for continued care once medically cleared.  Will notify psychiatry of patient's medical clearance so we can proceed with transferring upon bed availability.

## 2023-07-10 NOTE — Assessment & Plan Note (Signed)
 Rate controlled Patient currently in controlled normal sinus rhythm Continue home regimen of Eliquis and Coreg

## 2023-07-10 NOTE — ED Notes (Signed)
 Spoke to patients wife who originally stated patients medication and wallet were missing, now stating she found out her daughter took them home. Requesting to be periodically updated on patients care.

## 2023-07-10 NOTE — Hospital Course (Addendum)
 63yo with h/o NICM, DM, stage 3 CKD, HTN, afib, and B chronic blindness who presented on 4/3 with depression, SI, visual hallucinations.  Due to concern for chest pain, he was admitted to [facility] on suicide precautions.  Dr. Sharyn Lull (cardiology) consulted and cleared him.  He is medically stable for inpatient [facility] admission, on IVC since 4/5.

## 2023-07-10 NOTE — Assessment & Plan Note (Signed)
 Resume home regimen of insulin Accu-Cheks before every meal and nightly with sliding scale insulin Continue farxiga Hemoglobin A1c 7.1% 04/22/2023

## 2023-07-10 NOTE — Assessment & Plan Note (Signed)
-  Continue Coreg, Delene Loll

## 2023-07-11 DIAGNOSIS — R0789 Other chest pain: Secondary | ICD-10-CM | POA: Diagnosis not present

## 2023-07-11 DIAGNOSIS — I480 Paroxysmal atrial fibrillation: Secondary | ICD-10-CM | POA: Diagnosis not present

## 2023-07-11 DIAGNOSIS — N1831 Chronic kidney disease, stage 3a: Secondary | ICD-10-CM | POA: Diagnosis not present

## 2023-07-11 DIAGNOSIS — R441 Visual hallucinations: Secondary | ICD-10-CM | POA: Diagnosis not present

## 2023-07-11 DIAGNOSIS — I10 Essential (primary) hypertension: Secondary | ICD-10-CM | POA: Diagnosis not present

## 2023-07-11 DIAGNOSIS — R079 Chest pain, unspecified: Secondary | ICD-10-CM | POA: Diagnosis not present

## 2023-07-11 DIAGNOSIS — R45851 Suicidal ideations: Secondary | ICD-10-CM | POA: Diagnosis not present

## 2023-07-11 DIAGNOSIS — I2510 Atherosclerotic heart disease of native coronary artery without angina pectoris: Secondary | ICD-10-CM | POA: Diagnosis not present

## 2023-07-11 DIAGNOSIS — R748 Abnormal levels of other serum enzymes: Secondary | ICD-10-CM | POA: Diagnosis not present

## 2023-07-11 LAB — GLUCOSE, CAPILLARY
Glucose-Capillary: 110 mg/dL — ABNORMAL HIGH (ref 70–99)
Glucose-Capillary: 170 mg/dL — ABNORMAL HIGH (ref 70–99)
Glucose-Capillary: 129 mg/dL — ABNORMAL HIGH (ref 70–99)
Glucose-Capillary: 150 mg/dL — ABNORMAL HIGH (ref 70–99)

## 2023-07-11 MED ORDER — NEOMYCIN-POLYMYXIN-DEXAMETH 3.5-10000-0.1 OP OINT
1.0000 | TOPICAL_OINTMENT | Freq: Every day | OPHTHALMIC | Status: DC
  Administered 2023-07-11 – 2023-07-14 (×4): 1 via OPHTHALMIC
  Filled 2023-07-11: qty 3.5

## 2023-07-11 NOTE — TOC Initial Note (Addendum)
 Transition of Care [facility]) - Initial/Assessment Note   Patient Details  Name: [name] MRN: [id number] Date of Birth: 07-Apr-1961  Transition [facility]) CM/SW Contact:    Ewing Schlein, LCSW Phone Number: 07/11/2023, 11:33 AM  Clinical Narrative: [facility] does not have an inpatient psych bed. CSW met with patient to sign voluntary consent to treat form. Patient reported he cannot sign the form due to his blindness and he does not have any family present to sign for him. Consent form placed on chart. CSW faxed out inpatient psych referral. [facility] awaiting psych bed offers.  Addendum: Patient will now be under IVC. IVC uploaded to eFile. Envelope # is: [id number]. CSW called weekend magistrates' number regarding IVC. TOC awaiting patient to be served.  Expected Discharge Plan: Psychiatric Hospital Barriers to Discharge: Continued Medical Work up  Patient Goals and CMS Choice Patient states their goals for this hospitalization and ongoing recovery are:: Get treatment Choice offered to / list presented to : NA  Expected Discharge Plan and Services In-house Referral: Clinical Social Work Post Acute Care Choice: NA Living arrangements for the past 2 months: Apartment           DME Arranged: N/A DME Agency: NA  Prior Living Arrangements/Services Living arrangements for the past 2 months: Apartment Patient language and need for interpreter reviewed:: Yes Do you feel safe going back to the place where you live?: Yes      Need for Family Participation in Patient Care: Yes (Comment) Care giver support system in place?: Yes (comment) Criminal Activity/Legal Involvement Pertinent to Current Situation/Hospitalization: No - Comment as needed  Activities of Daily Living ADL Screening (condition at time of admission) Independently performs ADLs?: Yes (appropriate for developmental age) Is the patient deaf or have difficulty hearing?: No Does the patient have difficulty seeing, even when wearing  glasses/contacts?: Yes Does the patient have difficulty concentrating, remembering, or making decisions?: No  Permission Sought/Granted Permission sought to share information with : Facility Industrial/product designer granted to share information with : Yes, Verbal Permission Granted Permission granted to share info w AGENCY: Inpatient psych hospitals  Emotional Assessment Appearance:: Appears stated age Attitude/Demeanor/Rapport: Engaged Affect (typically observed): Accepting Orientation: : Oriented to Self, Oriented to Place, Oriented to  Time, Oriented to Situation Alcohol / Substance Use: Not Applicable Psych Involvement: Yes (comment)  Admission diagnosis:  Precordial pain [R07.2] Suicidal ideation [R45.851] Elevated troponin [R79.89] Chest pain [R07.9] Patient Active Problem List   Diagnosis Date Noted   Chest pain 07/10/2023   Suicidal ideation 07/10/2023   Elevated troponin level not due myocardial infarction 07/10/2023   Difficulty obtaining medication 04/23/2023   Venous stasis ulcer (HCC) 03/17/2023   Urinary incontinence without sensory awareness 01/23/2023   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   Visual hallucinations 09/14/2021   Chronic anticoagulation 08/23/2020   Metatarsalgia of both feet 04/12/2020   Chronic gout due to renal impairment of multiple sites without tophus 04/12/2020   Paroxysmal atrial fibrillation (HCC) 09/21/2019   Paroxysmal VT (HCC) 06/22/2019   Glaucoma 11/07/2015   NICM (nonischemic cardiomyopathy) (HCC) 05/30/2015   Encounter for chronic pain management 05/17/2014   Blind in both eyes 06/15/2013   Hyperlipidemia with target low density lipoprotein (LDL) cholesterol less than 100 mg/dL 40/98/1191   Single implantable cardioverter-defibrillator (ICD) in situ 06/17/2011   OBESITY, MORBID 05/21/2010   Obstructive sleep apnea 03/27/2009   Type 2 diabetes mellitus with stage 3b chronic kidney disease, with long-term current use of  insulin (HCC)  08/25/2007   Essential Hypertension 02/18/2007   Chronic systolic heart failure (HCC) 02/18/2007   Chronic kidney disease, stage 3a (HCC) 02/18/2007   PCP:  Nestor Ramp, MD Pharmacy:   [facility] [phone] Ginette Otto, [place] - [address] AT [facility] [address] [place] [address] Phone: [phone] Fax: [phone]  [facility] [address] [place] [address] Phone: [phone] Fax: [phone]  Social Drivers of Health (SDOH) Social History: SDOH Screenings   Food Insecurity: No Food Insecurity (07/10/2023)  Housing: Low Risk  (07/10/2023)  Transportation Needs: No Transportation Needs (07/10/2023)  Utilities: Not At Risk (07/10/2023)  Alcohol Screen: Low Risk  (11/24/2022)  Depression (PHQ2-9): Low Risk  (04/22/2023)  Recent Concern: Depression (PHQ2-9) - High Risk (03/17/2023)  Financial Resource Strain: Low Risk  (11/24/2022)  Physical Activity: Inactive (11/24/2022)  Social Connections: Socially Isolated (07/10/2023)  Stress: No Stress Concern Present (11/24/2022)  Tobacco Use: Low Risk  (07/10/2023)  Health Literacy: Inadequate Health Literacy (11/24/2022)   SDOH Interventions:    Readmission Risk Interventions     No data to display

## 2023-07-11 NOTE — Progress Notes (Signed)
 PROGRESS NOTE   [name]  QMV:[id number] DOB: 07-23-1960 DOA: 07/09/2023 PCP: Nestor Ramp, MD   Date of Service: the patient was seen and examined on 07/11/2023  Brief Narrative:  63 y.o. male with history of nonischemic cardiomyopathy, diabetes mellitus type 2, chronic kidney disease stage III, hypertension, paroxysmal atrial fibrillation, blindness in both eyes had originally come to behavioral health after patient's family noticed that patient has been complaining of suicidal thoughts and also has been recently having visual hallucinations.  Upon evaluation at behavioral health patient was also found to be complaining of chest pain and was brought to [facility] emergency department for further evaluation.  In the emergency department, the hospital was called to see the patient for admission the hospital for chest pain workup.  Patient was placed on suicide precautions.  Admitting provider discussed with patient's cardiologist Dr. Sharyn Lull who agreed to see the patient following the in consultation.     Assessment & Plan Chest pain Atypical chest discomfort that seems to have resolved This has been associated with slight elevation of troponin with flat trajectory of elevation making plaque rupture unlikely No associated dynamic ST segment change based on review of EKG Patient has been evaluated by cardiology who feels that ACS/ischemia is unlikely.  Cardiology is signing off and clearing from a cardiology standpoint. Workup for other etiologies of chest discomfort has been negative with normal D-dimer and chest x-ray. Suicidal ideation Patient continues to report suicidal ideation Patient additionally reporting visual hallucinations although he is essentially blind. Psychiatry following, their assistance is appreciated.  They have discontinued the patient's Seroquel and have started Risperdal 0.5 mg twice daily. Patient is once again getting defensive when asking detailed questions  about his mood and personal life. Psychiatry recommending that this patient be placed in a geriatric psych behavioral health bed whether it be at a [facility] health facility or elsewhere.  Patient has refused to go anywhere but a [facility] facility and therefore has had an IVC initiated since patient is not complying with care plan and is a threat to himself.   TOC additionally assisting, all efforts are appreciated.   Visual hallucinations Patient additionally reporting visual hallucinations although he is essentially blind. Psychiatry following, their assistance is appreciated.  They have discontinued the patient's Seroquel and have started Risperdal 0.5 mg twice daily. Elevated troponin level not due myocardial infarction See notes above, flat trajectory of elevation, atypical chest discomfort all make plaque rupture extremely unlikely. Type 2 diabetes mellitus with stage 3b chronic kidney disease, with long-term current use of insulin (HCC) Resume home regimen of insulin Accu-Cheks before every meal and nightly with sliding scale insulin Continue farxiga Hemoglobin A1c 7.1% 04/22/2023  Chronic kidney disease, stage 3a (HCC) Strict intake and output monitoring Creatinine near baseline Minimizing nephrotoxic agents as much as possible Serial chemistries to monitor renal function and electrolytes  Essential Hypertension Continue Coreg, Entresto Paroxysmal atrial fibrillation (HCC) Rate controlled Patient currently in controlled normal sinus rhythm Continue home regimen of Eliquis and Coreg NICM (nonischemic cardiomyopathy) (HCC) Euvolemic Continue Entresto     Subjective:  Patient reports that he is currently chest pain-free.  Patient continues to complain of suicidal ideation saying that he wishes to hurt himself and end his life.  Patient is unwilling to provide additional detail.  Physical Exam:  Vitals:   07/10/23 2016 07/11/23 0534 07/11/23 0817 07/11/23 0947  BP: 136/87 130/80   138/73  Pulse: 82 64  75  Resp: 20 16  Temp: 99.1 F (37.3 C) 98.4 F (36.9 C)    TempSrc: Oral Oral    SpO2: 97% 96% 97% 93%  Weight:      Height:        Constitutional: Awake alert and oriented x3, no associated distress.   Skin: no rashes, no lesions, good skin turgor noted. Eyes: Eyes closed throughout interview. ENMT: Moist mucous membranes noted.  Posterior pharynx clear of any exudate or lesions.   Respiratory: clear to auscultation bilaterally, no wheezing, no crackles. Normal respiratory effort. No accessory muscle use.  Cardiovascular: Regular rate with irregularly irregular rhythm.  No murmurs / rubs / gallops. No extremity edema. 2+ pedal pulses. No carotid bruits.  Abdomen: Abdomen is soft and nontender.  No evidence of intra-abdominal masses.  Positive bowel sounds noted in all quadrants.   Musculoskeletal: No joint deformity upper and lower extremities. Good ROM, no contractures. Normal muscle tone.  Psych: Somewhat angry mood during today's interview.  Admits to ongoing suicidal ideation.     Data Reviewed:  I have personally reviewed and interpreted labs, imaging.  Significant findings are   CBC: Recent Labs  Lab 07/09/23 2325 07/10/23 0526  WBC 11.7* 9.5  NEUTROABS  --  6.0  HGB 17.4* 13.1  HCT 51.2 39.8  MCV 88.6 88.6  PLT 230 184   Basic Metabolic Panel: Recent Labs  Lab 07/09/23 2325 07/10/23 0526  NA 141 143  K 3.2* 3.2*  CL 110 110  CO2 21* 23  GLUCOSE 163* 143*  BUN 20 19  CREATININE 1.66* 1.65*  CALCIUM 8.8* 8.1*   GFR: Estimated Creatinine Clearance: 57.6 mL/min (A) (by C-G formula based on SCr of 1.65 mg/dL (H)). Liver Function Tests: Recent Labs  Lab 07/09/23 2325 07/10/23 0526  AST 17 14*  ALT 10 11  ALKPHOS 59 51  BILITOT 1.0 1.2  PROT 8.1 7.1  ALBUMIN 3.8 3.1*    Coagulation Profile: No results for input(s): "INR", "PROTIME" in the last 168 hours.   EKG: Personally reviewed.  Rhythm is atrial fibrillation with  heart rate of 85 bpm.  No dynamic ST segment changes appreciated.   Code Status:  Full code.  Code status decision has been confirmed with: patient    Severity of Illness:  The appropriate patient status for this patient is OBSERVATION. Observation status is judged to be reasonable and necessary in order to provide the required intensity of service to ensure the patient's safety. The patient's presenting symptoms, physical exam findings, and initial radiographic and laboratory data in the context of their medical condition is felt to place them at decreased risk for further clinical deterioration. Furthermore, it is anticipated that the patient will be medically stable for discharge from the hospital within 2 midnights of admission.   Time spent:  35 minutes  Author:  Marinda Elk MD  07/11/2023 10:09 AM

## 2023-07-11 NOTE — Plan of Care (Signed)
  Problem: Education: Goal: Knowledge of General Education information will improve Description: Including pain rating scale, medication(s)/side effects and non-pharmacologic comfort measures Outcome: Progressing   Problem: Health Behavior/Discharge Planning: Goal: Ability to manage health-related needs will improve Outcome: Progressing   Problem: Clinical Measurements: Goal: Ability to maintain clinical measurements within normal limits will improve Outcome: Progressing Goal: Will remain free from infection Outcome: Progressing Goal: Diagnostic test results will improve Outcome: Progressing Goal: Respiratory complications will improve Outcome: Progressing Goal: Cardiovascular complication will be avoided Outcome: Progressing   Problem: Activity: Goal: Risk for activity intolerance will decrease Outcome: Progressing   Problem: Nutrition: Goal: Adequate nutrition will be maintained Outcome: Progressing   Problem: Coping: Goal: Level of anxiety will decrease Outcome: Progressing   Problem: Elimination: Goal: Will not experience complications related to bowel motility Outcome: Progressing Goal: Will not experience complications related to urinary retention Outcome: Progressing   Problem: Pain Managment: Goal: General experience of comfort will improve and/or be controlled Outcome: Progressing   Problem: Safety: Goal: Ability to remain free from injury will improve Outcome: Progressing   Problem: Skin Integrity: Goal: Risk for impaired skin integrity will decrease Outcome: Progressing   Problem: Education: Goal: Ability to describe self-care measures that may prevent or decrease complications (Diabetes Survival Skills Education) will improve Outcome: Progressing Goal: Individualized Educational Video(s) Outcome: Progressing   Problem: Coping: Goal: Ability to adjust to condition or change in health will improve Outcome: Progressing   Problem: Fluid  Volume: Goal: Ability to maintain a balanced intake and output will improve Outcome: Progressing   Problem: Health Behavior/Discharge Planning: Goal: Ability to identify and utilize available resources and services will improve Outcome: Progressing Goal: Ability to manage health-related needs will improve Outcome: Progressing   Problem: Metabolic: Goal: Ability to maintain appropriate glucose levels will improve Outcome: Progressing   Problem: Skin Integrity: Goal: Risk for impaired skin integrity will decrease Outcome: Progressing   Problem: Tissue Perfusion: Goal: Adequacy of tissue perfusion will improve Outcome: Progressing

## 2023-07-11 NOTE — Plan of Care (Signed)
  Problem: Activity: Goal: Risk for activity intolerance will decrease Outcome: Progressing   Problem: Elimination: Goal: Will not experience complications related to bowel motility Outcome: Progressing Goal: Will not experience complications related to urinary retention Outcome: Progressing   Problem: Pain Managment: Goal: General experience of comfort will improve and/or be controlled Outcome: Progressing   Problem: Safety: Goal: Ability to remain free from injury will improve Outcome: Progressing   Problem: Skin Integrity: Goal: Risk for impaired skin integrity will decrease Outcome: Progressing   Problem: Fluid Volume: Goal: Ability to maintain a balanced intake and output will improve Outcome: Progressing   Problem: Skin Integrity: Goal: Risk for impaired skin integrity will decrease Outcome: Progressing   Problem: Tissue Perfusion: Goal: Adequacy of tissue perfusion will improve Outcome: Progressing

## 2023-07-11 NOTE — Assessment & Plan Note (Addendum)
 Patient continues to report suicidal ideation Patient additionally reporting visual hallucinations although he is essentially blind. Psychiatry following, their assistance is appreciated.  They have discontinued the patient's Seroquel and have started Risperdal 0.5 mg twice daily. Patient is once again getting defensive when asking detailed questions about his mood and personal life. Psychiatry recommending that this patient be placed in a geriatric psych behavioral health bed whether it be at a [facility] health facility or elsewhere.  Patient has refused to go anywhere but a [facility] facility and therefore has had an IVC initiated since patient is not complying with care plan and is a threat to himself.   TOC additionally assisting, all efforts are appreciated.

## 2023-07-11 NOTE — Assessment & Plan Note (Signed)
 Strict intake and output monitoring Creatinine near baseline Minimizing nephrotoxic agents as much as possible Serial chemistries to monitor renal function and electrolytes

## 2023-07-11 NOTE — Assessment & Plan Note (Signed)
 Rate controlled Patient currently in controlled normal sinus rhythm Continue home regimen of Eliquis and Coreg

## 2023-07-11 NOTE — Assessment & Plan Note (Signed)
-  Continue Coreg, Delene Loll

## 2023-07-11 NOTE — Progress Notes (Signed)
 Subjective:  Patient denies any chest pain or shortness of breath overall feels well.  High-sensitivity troponin are trending down  Objective:  Vital Signs in the last 24 hours: Temp:  [97.7 F (36.5 C)-99.1 F (37.3 C)] 98.4 F (36.9 C) (04/05 0534) Pulse Rate:  [64-82] 64 (04/05 0534) Resp:  [16-20] 16 (04/05 0534) BP: (122-158)/(80-103) 130/80 (04/05 0534) SpO2:  [94 %-97 %] 97 % (04/05 0817)  Intake/Output from previous day: 04/04 0701 - 04/05 0700 In: 1340 [P.O.:1340] Out: 1300 [Urine:1300] Intake/Output from this shift: No intake/output data recorded.  Physical Exam: Exam unchanged  Lab Results: Recent Labs    07/09/23 2325 07/10/23 0526  WBC 11.7* 9.5  HGB 17.4* 13.1  PLT 230 184   Recent Labs    07/09/23 2325 07/10/23 0526  NA 141 143  K 3.2* 3.2*  CL 110 110  CO2 21* 23  GLUCOSE 163* 143*  BUN 20 19  CREATININE 1.66* 1.65*   No results for input(s): "TROPONINI" in the last 72 hours.  Invalid input(s): "CK", "MB" Hepatic Function Panel Recent Labs    07/10/23 0526  PROT 7.1  ALBUMIN 3.1*  AST 14*  ALT 11  ALKPHOS 51  BILITOT 1.2  BILIDIR 0.2  IBILI 1.0*   No results for input(s): "CHOL" in the last 72 hours. No results for input(s): "PROTIME" in the last 72 hours.  Imaging: Imaging results have been reviewed and DG Chest 2 View Result Date: 07/10/2023 CLINICAL DATA:  Chest pain EXAM: CHEST - 2 VIEW COMPARISON:  06/24/2021 FINDINGS: Stable cardiomegaly. Left chest wall ICD. No focal consolidation, pleural effusion, or pneumothorax. No displaced rib fractures. IMPRESSION: No active cardiopulmonary disease. Electronically Signed   By: Minerva Fester M.D.   On: 07/10/2023 00:05    Cardiac Studies:  Assessment/Plan:  Status post noncardiac chest pain Minimally elevated high-sensitivity troponin I due to demand ischemia and doubt significant MI Nonischemic cardiomyopathy Hypertensive heart disease with systolic dysfunction History of VT in  the past status post ICD Paroxysmal atrial fibrillation CHA2DS2-VASc score of 5 on chronic anticoagulation Uncontrolled hypertension Diabetes mellitus Hyperlipidemia Chronic kidney disease stage IIIb Morbid obesity Obstructive sleep apnea History of glaucoma Blindness in both eyes Depression suicidal ideation/hallucination Plan Continue present management I will sign off please call if needed  LOS: 0 days    Rinaldo Cloud 07/11/2023, 9:10 AM

## 2023-07-11 NOTE — Assessment & Plan Note (Signed)
 See notes above, flat trajectory of elevation, atypical chest discomfort all make plaque rupture extremely unlikely.

## 2023-07-11 NOTE — Assessment & Plan Note (Signed)
 Atypical chest discomfort that seems to have resolved This has been associated with slight elevation of troponin with flat trajectory of elevation making plaque rupture unlikely No associated dynamic ST segment change based on review of EKG Patient has been evaluated by cardiology who feels that ACS/ischemia is unlikely.  Cardiology is signing off and clearing from a cardiology standpoint. Workup for other etiologies of chest discomfort has been negative with normal D-dimer and chest x-ray.

## 2023-07-11 NOTE — Assessment & Plan Note (Signed)
 Euvolemic Continue [name]

## 2023-07-11 NOTE — Consult Note (Cosign Needed Addendum)
 [facility] Psychiatric Consult Initial  Patient Name: .[name]  MRN: [id number]  DOB: 08-30-1960  Consult Order details:  Orders (From admission, onward)     Start     Ordered   07/10/23 0540  IP CONSULT TO PSYCHIATRY       Ordering Provider: Eduard Clos, MD  Provider:  (Not yet assigned)  Question Answer Comment  Location [facility]   Reason for Consult? Please consult for suicidal thoughts.      07/10/23 0539             Mode of Visit: In person    Psychiatry Consult Evaluation  Service Date: July 11, 2023 LOS:  LOS: 0 days  Chief Complaint "I was seeing people and bright lights".  Primary Psychiatric Diagnoses  Visual hallucinations   Assessment  [name] is a 63 y.o. male admitted: Medicallyfor 07/09/2023 10:57 PM for chest pain. He carries the psychiatric diagnoses of VH for the past 3-4 years and has a past medical history of  nonischemic cardiomyopathy, diabetes mellitus type 2, chronic kidney disease stage III, hypertension, paroxysmal atrial fibrillation, blindness in both eyes .   His current presentation of seeing people and bright lights despite being blind is most consistent with visual hallucinations. He meets criteria for visual hallucinations based on seeing people and bright lights for the past 3-4 years with paranoia that these people are after him.  Current outpatient psychotropic medications include Serqouel and historically he has had a non response recently to these medications. He was compliant with medications prior to admission as evidenced by self-report. On initial examination, patient was not having hallucinations as "I haven't been outside", positive for hallucinations predominately when outside, sometimes within his home. Please see plan below for detailed recommendations.   07/11/2023: The client reported today on assessment that he started having hallucinations as soon as this provider left yesterday.   Discussed the next step for geriatric psych which he was agreeable.  This provider called his son who he lives with yesterday after permission received, no answer.  Today, he gave permission to speak to his wife who does not live with him as she lives "out of town" in [place].  His wife, [name], was contacted at 9:25 am, no answer with a brief message left.  This provider returned later as the client was preparing for his am bath with the tech.  His nurse was assisting him with his water after her took his medications to which he was irritable that she adjusted the lid which she explained to prevent it from spilling on him.  He continued to be irritable and sarcastic on the assessment with this provider.  He complained of hallucinations yesterday and then last night he didn't see people like he normally does but he saw his room "get dark and bright".  Sleep was "alright", appetite is fair.  He was still agreeable to go inpatient but [place]  Collateral information from his wife, [name], at approximately 11:30 am on 07/11/2023.  She had wanted to be updated on the plan and told the plan was to seek geriatric psych for him.  Then, she stated, "He can't leave [facility] because no one has a valid driver's license or tags"  Explained the closest geriatric psych was at [facility] and she got upset and stated, "No ma'am.  Do I need to get a cab and come get my husband".  Attempted to de-escalate the situation and she reported that he was not having hallucinations that he told "them that so they would treat his medical" issues.  However, the [facility] for hallucinations with an increase in depression and suicidal ideations along with notes saying he was walking around with a knife at times threatening to end his life.    The psychiatric said he needed to go to the first available bed and needed to be IVC'd,  paper work completed.   Diagnoses:  Active Hospital problems: Principal Problem:   Chest pain Active Problems:   Visual hallucinations   Obstructive sleep apnea   Essential Hypertension   Chronic kidney disease, stage 3a (HCC)   Type 2 diabetes mellitus with stage 3b chronic kidney disease, with long-term current use of insulin (HCC)   OBESITY, MORBID   Blind in both eyes   NICM (nonischemic cardiomyopathy) (HCC)   Paroxysmal atrial fibrillation (HCC)   Suicidal ideation   Elevated troponin level not due myocardial infarction    Plan   ## Psychiatric Medication Recommendations:  -Discontinued the Seroquel 150 mg at bedttime -Started Risperdal 0.5 mg BID  ## Medical Decision Making Capacity: Not specifically addressed in this encounter  ## Further Work-up:  -- most recent EKG on 4/4  had QtC of 458 -- Pertinent labwork reviewed earlier this admission includes: CBC with diff, troponin, chem panel, HIV test, toxicology   ## Disposition:-- We recommend inpatient psychiatric hospitalization when medically cleared. Patient is under voluntary admission status at this time; please IVC if attempts to leave hospital.  ## Behavioral / Environmental: - No specific recommendations at this time.     ## Safety and Observation Level:  - Based on my clinical evaluation, I estimate the patient to be at moderate risk of self harm in the current setting. - At this time, we recommend  1:1 Observation. This decision is based on my review of the chart including patient's history and current presentation, interview of the patient, mental status examination, and consideration of suicide risk including evaluating suicidal ideation, plan, intent, suicidal or self-harm behaviors, risk factors, and protective factors. This judgment is based on our ability to directly address suicide risk, implement suicide prevention strategies, and develop a safety plan while the patient is in the clinical setting.  Please contact our team if there is a concern that risk level has changed.  CSSR Risk Category:C-SSRS RISK CATEGORY: High Risk  Suicide Risk Assessment: Patient has following modifiable risk factors for suicide: active suicidal ideation and untreated depression, which we are addressing by starting medications and seeing inpatient hospitalization unless symptoms resolve prior to medical clearance. Patient has following non-modifiable or demographic risk factors for suicide: male gender and history of suicide attempt Patient has the following protective factors against suicide: Supportive family and no history of NSSIB  Thank you for this consult request. Recommendations have been communicated to the primary team.  We will continue to follow at this time.   Nanine Means, NP       History of Present Illness  Relevant Aspects of [facility] Course:  Admitted on 07/09/2023 for chest pain. They are currently being followed by cardiology.   Patient Report:  63 yo male with nonischemic cardiomyopathy, diabetes mellitus type 2, chronic kidney disease stage III, hypertension, paroxysmal atrial fibrillation, blindness in both eyes, and visual hallucinations admitted for chest pain after transferring from the [facility] where he presented with [facility]  and suicidal ideations with a plan to end his life.  On assessment, he reported he has been seeing "people and bright lights" despite being blind except for the ability to see shadows out of the sides of his eyes.  Denies hallucinations since admission as "I haven't been outside".  When inquired about this, he stated he usually has them constantly while outside and sometime inside.  He was seeing people following him on bicycles with these people and bikes being on the porch and yard that his son or daughter saw.  Sometimes he sees the people inside the home.  Prior to admission, "I got fed up with it" and wanted to end his life as these have been occurring for the  past 3-4 years.  He fears these people are trying to harm him but they never say anything.  Per the notes, it stated the client was walking around the house at times carrying a knife threatening to harm himself.  He admitted to ano overdose on sleep pills in 2008 or 2009 after he "got fed up with people not being nice to me when I was always nice to them".  He went to the hospital but was not admitted to inpatient psychiatry, no other attempts or admissions.  Currently, his depression is moderate and anxiety is moderate to high.  His depression gets "real bad" with the hallucinations.  He is currently living with his son, Adam Rockwell., and gave permission for collateral (unable to reach him, will continue to try).  "He think I'm putting on".  Denies trauma, and abuse, past and present.  He was one of 9 children with his father, brother, and sister having substance abuse issues.  This is the reason he does not use any substances.    Psych ROS:  Depression: moderate Anxiety:  moderate to high Mania (lifetime and current): denied Psychosis: (lifetime and current): AH  Collateral information:  Continuing to reach his son who he lives with  Review of Systems  Psychiatric/Behavioral:  Positive for depression and hallucinations. The patient is nervous/anxious.   All other systems reviewed and are negative.    Psychiatric and Social History  Psychiatric History:  Information collected from patient and chart.  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 rate 16, height 5\' 2"  (1.575 m), weight (!) 143 kg, SpO2 97%. Body mass index is 57.66 kg/m.  Physical Exam  Mental Status Exam: General Appearance: Casual  Orientation:  Full (Time, Place, and Person)  Memory:  Immediate;   Good Recent;   Good Remote;   Good  Concentration:  Concentration: Good and Attention Span: Good  Recall:  Good  Attention  Good  Eye Contact:  poor, the client is bline  Speech:  Clear and Coherent  Language:  Good  Volume:  Normal  Mood: depression and anxiety  Affect:  Blunt  Thought Process:  Coherent  Thought Content:  Hallucinations: Visual prior to hospitalization  Suicidal Thoughts:  No  Homicidal Thoughts:  No  Judgement:  Fair  Insight:  Fair  Psychomotor Activity:  Decreased  Akathisia:  No  Fund of Knowledge:  Good      Assets:  Housing Leisure Time Resilience Social Support  Cognition:  WNL  ADL's:  Intact  AIMS (if indicated):        Other History   These have been pulled in through the EMR, reviewed, and updated if appropriate.  Family History:  The patient's family history includes Colon cancer in his father.  Medical History: Past Medical History:  Diagnosis Date   Arthritis    Arthritis of knee, degenerative 10/03/2011   Right > LEFT KNEE oa X-rays were not standing but showed some mild sclerosis and joint space loss of the medial compartment of the right knee. History of arthroscopy right knee. Report of injury as a child but unclear exactly what that was.  :I am aware of 12/01/18 Rx by Dr. Jennette Kettle.   While wife was hospitalized, narcotic Rx went missing when in-home sitter left.  To my knowledge, this is the first mi   Automatic implantable cardioverter-defibrillator in situ 07/15/2013   Blind in both eyes 06/15/2013   Right Sees Dr Dione Booze   Cataracts, bilateral    right   Chronic gout due to renal impairment of multiple sites without tophus 04/12/2020   Chronic systolic heart failure (HCC) 02/18/2007   Qualifier: Diagnosis of  By: Bryson Ha MD, Gwendalyn Ege    Congestive heart failure (HCC)    a. s/p MDT single chamber ICD    Diabetes mellitus    does not check blood sugars at home   Diverticulosis    Encounter for chronic pain management 05/17/2014   Indication for chronic opioid: severe arthritis RIGHTknee / low back Medication and dose: hydrocodone---uses prn # pills per month: gets #120 pills to last TWO months Last UDS date: 05/17/2014 Pain contract signed (Y/N): Y 05/17/2014 Date narcotic database last reviewed (include red flags): 05/17/2014    Glaucoma    bilateral   Glaucoma 11/07/2015   Severe See Dr. Dione Booze   H/O TIA (transient ischemic attack) and stroke    Hyperlipidemia    Hyperlipidemia with target low density lipoprotein (LDL) cholesterol less than 100 mg/dL 1/61/0960   Hypertension    HYPERTENSION, BENIGN 02/18/2007   Qualifier: Diagnosis of  By: Jennette Kettle MD, Ronald Lobo of both feet 04/12/2020   NICM (nonischemic cardiomyopathy) (HCC) 05/30/2015   Obesity    OBESITY, MORBID 05/21/2010   Qualifier: Diagnosis of  By: Graciela Husbands, MD, Susie Cassette    Obstructive sleep apnea 03/27/2009   Qualifier: Diagnosis of  By: Sherral Hammers, RN, BSN, Melanie     Paroxysmal atrial fibrillation (HCC) 09/21/2019   Paroxysmal VT (HCC) 06/22/2019   RENAL DISEASE, CHRONIC, STAGE III 02/18/2007   SHOULDER PAIN, LEFT 08/01/2009   History of left shoulder surgery.     Single implantable cardioverter-defibrillator (ICD) in situ 06/17/2011   Type 2 diabetes mellitus with stage 4 chronic kidney disease,  without long-term current use of insulin (HCC) 08/25/2007    Surgical History: Past Surgical History:  Procedure Laterality Date  CARDIAC CATHETERIZATION  10/24/2003   EF of 45-50%   CARDIAC DEFIBRILLATOR PLACEMENT  2008   CATARACT EXTRACTION  2012   right   EP IMPLANTABLE DEVICE N/A 05/30/2015   Procedure: ICD Generator Changeout;  Surgeon: Duke Salvia, MD;  Location: [facility] INVASIVE CV LAB;  Service: Cardiovascular;  Laterality: N/A;   KNEE SURGERY Right 2000   SHOULDER SURGERY  2012   left     Medications:   Current Facility-Administered Medications:    acetaminophen (TYLENOL) tablet 650 mg, 650 mg, Oral, Q4H PRN, Shalhoub, Deno Lunger, MD, 650 mg at 07/10/23 1149   allopurinol (ZYLOPRIM) tablet 50 mg, 50 mg, Oral, Daily, Eduard Clos, MD, 50 mg at 07/10/23 1148   apixaban (ELIQUIS) tablet 5 mg, 5 mg, Oral, BID, Eduard Clos, MD, 5 mg at 07/10/23 2102   brimonidine (ALPHAGAN) 0.2 % ophthalmic solution 1 drop, 1 drop, Both Eyes, BID, Eduard Clos, MD, 1 drop at 07/10/23 2104   carvedilol (COREG) tablet 25 mg, 25 mg, Oral, BID, Eduard Clos, MD, 25 mg at 07/10/23 2102   dapagliflozin propanediol (FARXIGA) tablet 10 mg, 10 mg, Oral, Daily, Toniann Fail, Arshad N, MD, 10 mg at 07/10/23 1149   dorzolamide-timolol (COSOPT) 2-0.5 % ophthalmic solution 1 drop, 1 drop, Both Eyes, BID, Eduard Clos, MD, 1 drop at 07/10/23 2105   insulin aspart (novoLOG) injection 0-10 Units, 0-10 Units, Subcutaneous, TID AC & HS, Shalhoub, Deno Lunger, MD   insulin glargine-yfgn (SEMGLEE) injection 25 Units, 25 Units, Subcutaneous, Daily, Eduard Clos, MD, 25 Units at 07/10/23 1237   isosorbide mononitrate (ISMO) tablet 20 mg, 20 mg, Oral, BID, Eduard Clos, MD, 20 mg at 07/10/23 2102   latanoprost (XALATAN) 0.005 % ophthalmic solution 1 drop, 1 drop, Both Eyes, QPM, Eduard Clos, MD, 1 drop at 07/10/23 1848   mometasone-formoterol (DULERA) 200-5 MCG/ACT  inhaler 2 puff, 2 puff, Inhalation, Daily, Eduard Clos, MD, 2 puff at 07/11/23 0816   nitroGLYCERIN (NITROSTAT) SL tablet 0.4 mg, 0.4 mg, Sublingual, Q5 min PRN, Eduard Clos, MD   risperiDONE (RISPERDAL) tablet 0.5 mg, 0.5 mg, Oral, BID, Tyshon Fanning Y, NP, 0.5 mg at 07/10/23 2102   rosuvastatin (CRESTOR) tablet 10 mg, 10 mg, Oral, Daily, Eduard Clos, MD, 10 mg at 07/10/23 1148   sacubitril-valsartan (ENTRESTO) 49-51 mg per tablet, 1 tablet, Oral, BID, Eduard Clos, MD, 1 tablet at 07/10/23 2102  Allergies: Allergies  Allergen Reactions   Bee Pollen Anaphylaxis, Itching and Swelling   Bee Venom Anaphylaxis   Shellfish Allergy Anaphylaxis   Nsaids Other (See Comments)    CKD and CHF    Nanine Means, NP

## 2023-07-11 NOTE — Assessment & Plan Note (Signed)
 Resume home regimen of insulin Accu-Cheks before every meal and nightly with sliding scale insulin Continue farxiga Hemoglobin A1c 7.1% 04/22/2023

## 2023-07-11 NOTE — Assessment & Plan Note (Signed)
 Patient additionally reporting visual hallucinations although he is essentially blind. Psychiatry following, their assistance is appreciated.  They have discontinued the patient's Seroquel and have started Risperdal 0.5 mg twice daily.

## 2023-07-12 DIAGNOSIS — F333 Major depressive disorder, recurrent, severe with psychotic symptoms: Secondary | ICD-10-CM | POA: Diagnosis not present

## 2023-07-12 DIAGNOSIS — N184 Chronic kidney disease, stage 4 (severe): Secondary | ICD-10-CM | POA: Diagnosis not present

## 2023-07-12 DIAGNOSIS — R441 Visual hallucinations: Secondary | ICD-10-CM | POA: Diagnosis not present

## 2023-07-12 DIAGNOSIS — I480 Paroxysmal atrial fibrillation: Secondary | ICD-10-CM | POA: Diagnosis not present

## 2023-07-12 DIAGNOSIS — I10 Essential (primary) hypertension: Secondary | ICD-10-CM | POA: Diagnosis not present

## 2023-07-12 DIAGNOSIS — R45851 Suicidal ideations: Secondary | ICD-10-CM | POA: Diagnosis not present

## 2023-07-12 DIAGNOSIS — I5022 Chronic systolic (congestive) heart failure: Secondary | ICD-10-CM | POA: Diagnosis not present

## 2023-07-12 DIAGNOSIS — I130 Hypertensive heart and chronic kidney disease with heart failure and stage 1 through stage 4 chronic kidney disease, or unspecified chronic kidney disease: Secondary | ICD-10-CM | POA: Diagnosis not present

## 2023-07-12 DIAGNOSIS — Z794 Long term (current) use of insulin: Secondary | ICD-10-CM | POA: Diagnosis not present

## 2023-07-12 DIAGNOSIS — I428 Other cardiomyopathies: Secondary | ICD-10-CM | POA: Diagnosis not present

## 2023-07-12 DIAGNOSIS — E1122 Type 2 diabetes mellitus with diabetic chronic kidney disease: Secondary | ICD-10-CM | POA: Diagnosis not present

## 2023-07-12 LAB — GLUCOSE, CAPILLARY
Glucose-Capillary: 148 mg/dL — ABNORMAL HIGH (ref 70–99)
Glucose-Capillary: 153 mg/dL — ABNORMAL HIGH (ref 70–99)
Glucose-Capillary: 157 mg/dL — ABNORMAL HIGH (ref 70–99)
Glucose-Capillary: 119 mg/dL — ABNORMAL HIGH (ref 70–99)

## 2023-07-12 MED ORDER — NYSTATIN 100000 UNIT/GM EX POWD
Freq: Two times a day (BID) | CUTANEOUS | Status: DC
  Administered 2023-07-13: 1 via TOPICAL
  Filled 2023-07-12: qty 15

## 2023-07-12 NOTE — Plan of Care (Signed)

## 2023-07-12 NOTE — Progress Notes (Signed)
 PROGRESS NOTE   [name]  EXB:[id number] DOB: June 27, 1960 DOA: 07/09/2023 PCP: Nestor Ramp, MD   Date of Service: the patient was seen and examined on 07/12/2023  Brief Narrative:  63 y.o. male with history of nonischemic cardiomyopathy, diabetes mellitus type 2, chronic kidney disease stage III, hypertension, paroxysmal atrial fibrillation, blindness in both eyes had originally come to behavioral health after patient's family noticed that patient has been complaining of suicidal thoughts and also has been recently having visual hallucinations.  Upon evaluation at behavioral health patient was also found to be complaining of chest pain and was brought to [facility] emergency department for further evaluation.  In the emergency department, patient was placed on suicide precautions.  Admitting provider discussed with patient's cardiologist Dr. Sharyn Lull who agreed to see the patient following the in consultation.  The hospitalist group was then called to assess the patient for admission to the hospital.  Evaluation revealed flat trajectory of slightly elevated troponins making plaque rupture extremely unlikely.  Chest discomfort had resolved and was felt to be atypical and unlikely to be cardiac.  Workup for other etiologies of chest pain was unremarkable.  Cardiology had cleared the patient.  Hospital medicine has additionally cleared the patient.    TOC has working in conjunction with behavioral health to find a geriatric psychiatry bed for continued psychiatric care.  Shortly after arrival to medical floor patient became rather demanding surrounding which psychiatric hospital he would be willing to go to and due to his noncompliance with the treatment plan psychiatry initiated an IVC on 4/5.   Assessment & Plan Chest pain Atypical chest discomfort has resolved. This has been associated with slight elevation of troponin with flat trajectory of elevation making plaque rupture unlikely No  associated dynamic ST segment change based on review of EKG Patient has been evaluated by cardiology who feels that ACS/ischemia is unlikely.  Cardiology is signing off and clearing from a cardiology standpoint. Workup for other etiologies of chest discomfort has been negative with normal D-dimer and chest x-ray. Suicidal ideation Patient continues to report suicidal ideation Patient additionally reporting visual hallucinations although he is essentially blind. Psychiatry following, their assistance is appreciated.  They have discontinued the patient's Seroquel and have started Risperdal 0.5 mg twice daily. Patient is once again getting defensive when asking detailed questions about his mood and personal life. Psychiatry recommending that this patient be placed in a geriatric psych behavioral health bed whether it be at a [facility] health facility or elsewhere.  Patient has refused to go anywhere but a [facility] facility and therefore has had an IVC initiated since patient is not complying with care plan and is a threat to himself.   TOC additionally assisting, all efforts are appreciated.   Visual hallucinations Patient additionally reporting visual hallucinations although he is essentially blind. Psychiatry following, their assistance is appreciated.  They have discontinued the patient's Seroquel and have started Risperdal 0.5 mg twice daily. Elevated troponin level not due myocardial infarction See notes above, flat trajectory of elevation, atypical chest discomfort all make plaque rupture extremely unlikely. Type 2 diabetes mellitus with stage 3b chronic kidney disease, with long-term current use of insulin (HCC) Resume home regimen of insulin Accu-Cheks before every meal and nightly with sliding scale insulin Continue farxiga Hemoglobin A1c 7.1% 04/22/2023  Chronic kidney disease, stage 3a (HCC) Strict intake and output monitoring Creatinine near baseline Minimizing nephrotoxic agents as much as  possible Serial chemistries to monitor renal function and electrolytes  Essential Hypertension Continue Coreg, Entresto Paroxysmal atrial fibrillation (HCC) Rate controlled Patient currently in controlled normal sinus rhythm Continue home regimen of Eliquis and Coreg NICM (nonischemic cardiomyopathy) (HCC) Euvolemic Continue Entresto     Subjective:  Patient reports that he continues to be pain-free.  Patient continues to complain of suicidal ideation saying that he wishes to hurt himself and end his life.    Physical Exam:  Vitals:   07/12/23 0505 07/12/23 0845 07/12/23 1038 07/12/23 1234  BP: (!) 150/95  (!) 138/98 (!) 156/95  Pulse: 81  81 82  Resp: 18  16 18   Temp: 98 F (36.7 C)  98.3 F (36.8 C) 98.4 F (36.9 C)  TempSrc: Oral  Oral Oral  SpO2: 92% 93% 94% 95%  Weight:      Height:        Constitutional: Awake alert and oriented x3, no associated distress.   Skin: no rashes, no lesions, good skin turgor noted. Eyes: Eyes closed throughout interview. ENMT: Moist mucous membranes noted.  Posterior pharynx clear of any exudate or lesions.   Respiratory: clear to auscultation bilaterally, no wheezing, no crackles. Normal respiratory effort. No accessory muscle use.  Cardiovascular: Regular rate with irregularly irregular rhythm.  No murmurs / rubs / gallops. No extremity edema. 2+ pedal pulses. No carotid bruits.  Abdomen: Abdomen is soft and nontender.  No evidence of intra-abdominal masses.  Positive bowel sounds noted in all quadrants.   Musculoskeletal: No joint deformity upper and lower extremities. Good ROM, no contractures. Normal muscle tone.  Psych: Continued to have angry mood during today's interview.  Admits to ongoing suicidal ideation.     Data Reviewed:  I have personally reviewed and interpreted labs, imaging.  Significant findings are   CBC: Recent Labs  Lab 07/09/23 2325 07/10/23 0526  WBC 11.7* 9.5  NEUTROABS  --  6.0  HGB 17.4* 13.1   HCT 51.2 39.8  MCV 88.6 88.6  PLT 230 184   Basic Metabolic Panel: Recent Labs  Lab 07/09/23 2325 07/10/23 0526  NA 141 143  K 3.2* 3.2*  CL 110 110  CO2 21* 23  GLUCOSE 163* 143*  BUN 20 19  CREATININE 1.66* 1.65*  CALCIUM 8.8* 8.1*   GFR: Estimated Creatinine Clearance: 57.6 mL/min (A) (by C-G formula based on SCr of 1.65 mg/dL (H)). Liver Function Tests: Recent Labs  Lab 07/09/23 2325 07/10/23 0526  AST 17 14*  ALT 10 11  ALKPHOS 59 51  BILITOT 1.0 1.2  PROT 8.1 7.1  ALBUMIN 3.8 3.1*    Coagulation Profile: No results for input(s): "INR", "PROTIME" in the last 168 hours.   EKG: Personally reviewed.  Rhythm is atrial fibrillation with heart rate of 85 bpm.  No dynamic ST segment changes appreciated.   Code Status:  Full code.  Code status decision has been confirmed with: patient    Severity of Illness:  The appropriate patient status for this patient is OBSERVATION. Observation status is judged to be reasonable and necessary in order to provide the required intensity of service to ensure the patient's safety. The patient's presenting symptoms, physical exam findings, and initial radiographic and laboratory data in the context of their medical condition is felt to place them at decreased risk for further clinical deterioration. Furthermore, it is anticipated that the patient will be medically stable for discharge from the hospital within 2 midnights of admission.   Time spent:  40 minutes  Author:  Marinda Elk MD  07/12/2023 8:06  PM

## 2023-07-12 NOTE — Assessment & Plan Note (Addendum)
-  Continue Coreg, Delene Loll

## 2023-07-12 NOTE — Assessment & Plan Note (Addendum)
 Atypical chest discomfort has resolved. This has been associated with slight elevation of troponin with flat trajectory of elevation making plaque rupture unlikely No associated dynamic ST segment change based on review of EKG Patient has been evaluated by cardiology who feels that ACS/ischemia is unlikely.  Cardiology is signing off and clearing from a cardiology standpoint. Workup for other etiologies of chest discomfort has been negative with normal D-dimer and chest x-ray.

## 2023-07-12 NOTE — Assessment & Plan Note (Addendum)
 Patient additionally reporting visual hallucinations although he is essentially blind. Psychiatry following, their assistance is appreciated.  They have discontinued the patient's Seroquel and have started Risperdal 0.5 mg twice daily.

## 2023-07-12 NOTE — Progress Notes (Signed)
 Patient started to get agitated and using his cane to hit staff. Security was called and when trying to call family per patient request we called his wife [name] first who did not answer both calls. Then per pt request we called son who also did not answer. The pt then requested to speak with his daughter who did answer. Daughter was made aware of the situation. MD was rounding when this took place and was told about why security was called. Wife then called front desk and continually yelled and cussed at staff. She was told by me the LPN that we tried to contact her twice but she did not answer. She then asked for the phone number we called and I told her the home phone that was listed. She then told me that this number is incorrect and needs to be changed in the chart. Night shift nurse and MD Shalhoub made aware.

## 2023-07-12 NOTE — Assessment & Plan Note (Addendum)
 Rate controlled Patient currently in controlled normal sinus rhythm Continue home regimen of Eliquis and Coreg

## 2023-07-12 NOTE — Assessment & Plan Note (Addendum)
 Patient continues to report suicidal ideation Patient additionally reporting visual hallucinations although he is essentially blind. Psychiatry following, their assistance is appreciated.  They have discontinued the patient's Seroquel and have started Risperdal 0.5 mg twice daily. Patient is once again getting defensive when asking detailed questions about his mood and personal life. Psychiatry recommending that this patient be placed in a geriatric psych behavioral health bed whether it be at a [facility] health facility or elsewhere.  Patient has refused to go anywhere but a [facility] facility and therefore has had an IVC initiated since patient is not complying with care plan and is a threat to himself.   TOC additionally assisting, all efforts are appreciated.

## 2023-07-12 NOTE — TOC Progression Note (Signed)
 Transition of Care [facility]) - Progression Note    Patient Details  Name: [name] MRN: [id number] Date of Birth: Dec 15, 1960  [facility] CM/SW Contact  Larrie Kass, LCSW Phone Number: 07/12/2023, 11:09 AM  Clinical Narrative:   Pt's IVC paperwork has been completed and uploaded to pt's epic chart. IVC expires 07/17/23.  CSW spoke with the pt's spouse and explained the process for psych placement. Pt's spouse stated that she would like to be informed of where the pt is placed. Spouse understands that the pt and family do not have a choice in the matter and that it will be the first available bed. TOC to follow.    Expected Discharge Plan: Psychiatric Hospital Barriers to Discharge: Continued Medical Work up  Expected Discharge Plan and Services In-house Referral: Clinical Social Work   Post Acute Care Choice: NA Living arrangements for the past 2 months: Apartment                 DME Arranged: N/A DME Agency: NA                   Social Determinants of Health (SDOH) Interventions SDOH Screenings   Food Insecurity: No Food Insecurity (07/10/2023)  Housing: Low Risk  (07/10/2023)  Transportation Needs: No Transportation Needs (07/10/2023)  Utilities: Not At Risk (07/10/2023)  Alcohol Screen: Low Risk  (11/24/2022)  Depression (PHQ2-9): Low Risk  (04/22/2023)  Recent Concern: Depression (PHQ2-9) - High Risk (03/17/2023)  Financial Resource Strain: Low Risk  (11/24/2022)  Physical Activity: Inactive (11/24/2022)  Social Connections: Socially Isolated (07/10/2023)  Stress: No Stress Concern Present (11/24/2022)  Tobacco Use: Low Risk  (07/10/2023)  Health Literacy: Inadequate Health Literacy (11/24/2022)    Readmission Risk Interventions     No data to display

## 2023-07-12 NOTE — Assessment & Plan Note (Addendum)
 Strict intake and output monitoring Creatinine near baseline Minimizing nephrotoxic agents as much as possible Serial chemistries to monitor renal function and electrolytes

## 2023-07-12 NOTE — Assessment & Plan Note (Addendum)
 Resume home regimen of insulin Accu-Cheks before every meal and nightly with sliding scale insulin Continue farxiga Hemoglobin A1c 7.1% 04/22/2023

## 2023-07-12 NOTE — Assessment & Plan Note (Addendum)
 See notes above, flat trajectory of elevation, atypical chest discomfort all make plaque rupture extremely unlikely.

## 2023-07-12 NOTE — Assessment & Plan Note (Addendum)
 Euvolemic Continue Sherryll Burger

## 2023-07-13 ENCOUNTER — Encounter

## 2023-07-13 DIAGNOSIS — R441 Visual hallucinations: Secondary | ICD-10-CM | POA: Diagnosis not present

## 2023-07-13 LAB — GLUCOSE, CAPILLARY
Glucose-Capillary: 128 mg/dL — ABNORMAL HIGH (ref 70–99)
Glucose-Capillary: 179 mg/dL — ABNORMAL HIGH (ref 70–99)
Glucose-Capillary: 153 mg/dL — ABNORMAL HIGH (ref 70–99)
Glucose-Capillary: 135 mg/dL — ABNORMAL HIGH (ref 70–99)
Glucose-Capillary: 129 mg/dL — ABNORMAL HIGH (ref 70–99)

## 2023-07-13 MED ORDER — ORAL CARE MOUTH RINSE: 15.0000 mL | OROMUCOSAL | Status: DC | PRN

## 2023-07-13 NOTE — Progress Notes (Signed)
 Progress Note   Patient: [name] [id number]:[id number] DOB: 1961/01/13 DOA: 07/09/2023      DOS: the patient was seen and examined on 07/13/2023   Brief hospital course: 62yo with h/o NICM, DM, stage 3 CKD, HTN, afib, and B chronic blindness who presented on 4/3 with depression, SI, visual hallucinations.  Due to concern for chest pain, he was admitted to [facility] on suicide precautions.  Dr. Sharyn Lull (cardiology) consulted and cleared him.  He is medically stable for inpatient [facility] admission, on IVC since 4/5.  Assessment and Plan:   Suicidal ideation Patient continues to report suicidal ideation, none this AM Patient additionally reporting visual hallucinations although he is essentially blind Psychiatry following, discontinued Seroquel and started Risperdal 0.5 mg twice daily Psychiatry recommending that this patient be placed in a geriatric psych behavioral health bed whether it be at a [facility] facility or elsewhere Patient has refused to go anywhere but a [facility] facility and therefore has had an IVC initiated since patient is not complying with care plan and is a threat to himself [facility] team consulted to help with placement He is medically stable for inpatient [facility] placement   Chest pain Atypical chest discomfort, resolved Slight elevation of troponin, flat No associated dynamic ST segment change based on review of EKG Patient has been evaluated by cardiology who feels that ACS/ischemia is unlikely and has signed off Workup for other etiologies of chest discomfort has been negative with normal D-dimer and chest x-ray   Visual hallucinations Patient additionally reporting visual hallucinations although he is essentially blind Psychiatry following, their assistance is appreciated Changed Seroquel to Risperdal 0.5 mg twice daily   Type 2 diabetes mellitus with long-term current use of insulin  Resume home regimen of insulin Accu-Cheks before every meal and nightly with sliding scale  insulin Continue farxiga Hemoglobin A1c 7.1% 04/22/2023, good control   Chronic kidney disease, stage 3a  Creatinine at/near baseline Minimizing nephrotoxic agents as much as possible   Essential Hypertension Continue Coreg, Entresto   Paroxysmal atrial fibrillation  Rate controlled Patient currently in controlled normal sinus rhythm Continue home regimen of Eliquis and Coreg   NICM (nonischemic cardiomyopathy)  Euvolemic Continue Entresto         Consultants: Psychiatry Cardiology [facility] team   Procedures: None   Antibiotics: None       Subjective: He reports just 1 hallucination today.  Otherwise, no concerns other than wanting to put on regular clothes for possible discharge  I spoke with his wife.  She is very frustrated because he cannot be placed today, no psychiatric bed available.   Objective: Vitals:   07/13/23 1028 07/13/23 1435  BP: (!) 156/98 (!) 151/96  Pulse:  66  Resp:  15  Temp:  98.2 F (36.8 C)  SpO2:  96%    Intake/Output Summary (Last 24 hours) at 07/13/2023 1458 Last data filed at 07/13/2023 1235 Gross per 24 hour  Intake 960 ml  Output 1550 ml  Net -590 ml   Filed Weights   07/10/23 0529  Weight: (!) 143 kg    Exam:  General:  Appears calm and comfortable and is in NAD Eyes:  chronic visual impairment ENT:  grossly normal hearing, lips & tongue, mmm Cardiovascular:  RRR, no m/r/g. No LE edema.  Respiratory:   CTA bilaterally with no wheezes/rales/rhonchi.  Normal respiratory effort. Abdomen:  soft, NT, ND Skin:  no rash or induration seen on limited exam Musculoskeletal:  grossly normal tone BUE/BLE, good ROM,  no bony abnormality Psychiatric:  blunted mood and affect, speech fluent and appropriate, AOx3 Neurologic:  CN 2-12 grossly intact, moves all extremities in coordinated fashion  Data Reviewed: I have reviewed the patient's lab results since admission.  Pertinent labs for today include:   None     Family  Communication: None present  Disposition: Status is: Observation The patient remains OBS appropriate and will d/c before 2 midnights.     Time spent: 50 minutes  Unresulted Labs (From admission, onward)     Start     Ordered   07/14/23 0500  CBC with Differential/Platelet  Tomorrow morning,   R        07/13/23 1453   07/14/23 0500  Basic metabolic panel with GFR  Tomorrow morning,   R        07/13/23 1453             Author: Jonah Blue, MD 07/13/2023 2:58 PM  For on call review [email or web address].

## 2023-07-13 NOTE — Progress Notes (Signed)
 No ICM remote transmission received for 07/13/2023 due to currently hospitalized and next ICM transmission scheduled for 07/27/2023.

## 2023-07-13 NOTE — Consult Note (Signed)
 [facility] Psychiatric Consult Initial  Patient Name: [name]  MRN: [id number]  DOB: 15-Nov-1960  Consult Order details:  Orders (From admission, onward)     Start     Ordered   07/10/23 0540  IP CONSULT TO PSYCHIATRY       Ordering Provider: Eduard Clos, MD  Provider:  (Not yet assigned)  Question Answer Comment  Location [facility]   Reason for Consult? Please consult for suicidal thoughts.      07/10/23 0539             Mode of Visit: In person    Psychiatry Consult Evaluation  Service Date: July 13, 2023 LOS:  LOS: 0 days  Chief Complaint "I was seeing people and bright lights".  Primary Psychiatric Diagnoses  Visual hallucinations   Assessment  [name] is a 63 y.o. male admitted: Medicallyfor 07/09/2023 10:57 PM for chest pain. He carries the psychiatric diagnoses of VH for the past 3-4 years and has a past medical history of  nonischemic cardiomyopathy, diabetes mellitus type 2, chronic kidney disease stage III, hypertension, paroxysmal atrial fibrillation, blindness in both eyes .   His current presentation of seeing people and bright lights despite being blind is most consistent with visual hallucinations. He meets criteria for visual hallucinations based on seeing people and bright lights for the past 3-4 years with paranoia that these people are after him.  Current outpatient psychotropic medications include Serqouel and historically he has had a non response recently to these medications. He was compliant with medications prior to admission as evidenced by self-report. On initial examination, patient was not having hallucinations as "I haven't been outside", positive for hallucinations predominately when outside, sometimes within his home. Please see plan below for detailed recommendations.   07/11/2023: The client reported today on assessment that he started having hallucinations as soon as this provider left yesterday.   Discussed the next step for geriatric psych which he was agreeable.  This provider called his son who he lives with yesterday after permission received, no answer.  Today, he gave permission to speak to his wife who does not live with him as she lives "out of town" in [place].  His wife, [name], was contacted at 9:25 am, no answer with a brief message left.  This provider returned later as the client was preparing for his am bath with the tech.  His nurse was assisting him with his water after her took his medications to which he was irritable that she adjusted the lid which she explained to prevent it from spilling on him.  He continued to be irritable and sarcastic on the assessment with this provider.  He complained of hallucinations yesterday and then last night he didn't see people like he normally does but he saw his room "get dark and bright".  Sleep was "alright", appetite is fair.  He was still agreeable to go inpatient but [place].  Collateral information from his wife, [name], at approximately 11:30 am on 07/11/2023.  She had wanted to be updated on the plan and told the plan was to seek geriatric psych for him.  Then, she stated, "He can't leave [facility] because no one has a valid driver's license or tags"  Explained the closest geriatric psych was at [facility] and she got upset and stated, "No ma'am.  Do I need to get a cab and come get my husband".  Attempted to de-escalate the situation and she reported that he was not having hallucinations that he told "them that so they would treat his medical" issues.  However, the [facility] for hallucinations with an increase in depression and suicidal ideations along with notes saying he was walking around with a knife at times threatening to end his life.    The psychiatric said he needed to go to the first available bed and needed to be IVC'd,  paper work completed.   07/12/2023: Patient is alert and oriented x4, calm and cooperative, very attentive and engages well with psychiatric nurse practitioner.   On today's evaluation he is observed to be lying in the room. He appears to be receptive to current situation at hand to include searching for a new hospital bed. He is apprehensive about going out of the system due to transportation. He is able to have a linear conversation with this provider and laughs sometimes. He does not display any acute psychosis despite his admission of hallucinations. He denies any side effects at this time as it pertains to the Risperdal. He admits to declining the bed at [facility] hospital due to knowledge deficit"I didn't know that was a .".   There does not appear to be any evidence of confabulation, psychosis, delusional thinking.  He does not appear to be responding to internal stimuli, external stimuli.  He is able to engage well and follow all commands.  He further denies any thoughts to want to harm himself or other people.  He has been compliant with his psychotropic medications outside of the hospital. This does not appear to be acute exacerbation of schizophrenia, however patient is nearing his baseline from a psychiatric standpoint.  There is evidence of improvement in the patient's symptoms, particularly in his ability to communicate and engage in appropriate conversation. However, he still requires inpatient care to address the acute exacerbation of psychiatric symptoms and ensure continued stabilization. At this time, I recommend that the patient remain admitted for further evaluation and treatment.    Diagnoses:  Active Hospital problems: Principal Problem:   Chest pain Active Problems:   Obstructive sleep apnea   Essential Hypertension   Chronic kidney disease, stage 3a (HCC)   Type 2 diabetes mellitus with stage 3b chronic kidney disease, with long-term current use of insulin  (HCC)   OBESITY, MORBID   Blind in both eyes   NICM (nonischemic cardiomyopathy) (HCC)   Paroxysmal atrial fibrillation (HCC)   Visual hallucinations   Suicidal ideation   Elevated troponin level not due myocardial infarction    Plan   ## Psychiatric Medication Recommendations:  -Continue Risperdal 0.5 mg BID  ## Medical Decision Making Capacity: Not specifically addressed in this encounter  ## Further Work-up:  -- most recent EKG on 4/4  had QtC of 458 -- Pertinent labwork reviewed earlier this admission includes: CBC with diff, troponin, chem panel, HIV test, toxicology   ## Disposition:-- We recommend inpatient psychiatric hospitalization when medically cleared. Patient is under voluntary admission status at this time; please IVC if attempts to leave hospital.  ## Behavioral / Environmental: - No specific recommendations at this time.  -Patient no longer needs a suicide sitter, however suspect he is under IVC at this time.  -Continue to uphold IVC   ## Safety and Observation Level:  - Based on my clinical evaluation, I estimate the patient to be at moderate risk of self harm in  the current setting. - At this time, we recommend  routine. This decision is based on my review of the chart including patient's history and current presentation, interview of the patient, mental status examination, and consideration of suicide risk including evaluating suicidal ideation, plan, intent, suicidal or self-harm behaviors, risk factors, and protective factors. This judgment is based on our ability to directly address suicide risk, implement suicide prevention strategies, and develop a safety plan while the patient is in the clinical setting. Please contact our team if there is a concern that risk level has changed.  CSSR Risk Category:C-SSRS RISK CATEGORY: High Risk  Suicide Risk Assessment: Patient has following modifiable risk factors for suicide: active suicidal ideation and untreated  depression, which we are addressing by starting medications and seeing inpatient hospitalization unless symptoms resolve prior to medical clearance. Patient has following non-modifiable or demographic risk factors for suicide: male gender and history of suicide attempt Patient has the following protective factors against suicide: Supportive family and no history of NSSIB  Thank you for this consult request. Recommendations have been communicated to the primary team.  We will continue to follow at this time.   Maryagnes Amos, FNP       History of Present Illness  Relevant Aspects of [facility] Course:  Admitted on 07/09/2023 for chest pain. They are currently being followed by cardiology.   Patient Report:  Denies hallucinations, some inconsistency noted he denies to me but reports to hospitalist that he is having hallucinations although he is blind.  Denies suicidal ideations and or homicidal ideations.   Psych ROS:  Depression: moderate Anxiety:  moderate to high Mania (lifetime and current): denied Psychosis: (lifetime and current): AH  Collateral information:  Continuing to reach his son who he lives with  Review of Systems  Psychiatric/Behavioral:  Positive for depression and hallucinations. The patient is nervous/anxious.   All other systems reviewed and are negative.    Psychiatric and Social History  Psychiatric History:  Information collected from patient and chart.  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 rate 18, height 5\' 2"  (1.575 m), weight (!) 143 kg, SpO2 97%. Body mass index is 57.66 kg/m.  Physical Exam  Mental Status Exam: General Appearance: Casual  Orientation:  Full (Time, Place, and Person)  Memory:  Immediate;   Good Recent;   Good Remote;   Good  Concentration:  Concentration: Good and Attention Span: Good  Recall:  Good  Attention  Good  Eye Contact:  poor, the client is bline  Speech:  Clear and Coherent  Language:  Good  Volume:  Normal  Mood: depression and anxiety  Affect:  Blunt  Thought Process:  Coherent  Thought Content:  Hallucinations: Visual prior to hospitalization  Suicidal Thoughts:  No  Homicidal Thoughts:  No  Judgement:  Fair  Insight:  Fair  Psychomotor Activity:  Decreased  Akathisia:  No  Fund of Knowledge:  Good      Assets:  Housing Leisure Time Resilience Social Support  Cognition:  WNL  ADL's:  Intact  AIMS (if indicated):        Other History   These have been pulled in through the EMR, reviewed, and updated if appropriate.  Family History:  The patient's family history includes Colon cancer in his father.  Medical History: Past Medical History:  Diagnosis Date   Arthritis    Arthritis of knee, degenerative 10/03/2011   Right > LEFT KNEE oa X-rays were not standing but showed some mild sclerosis and joint space loss of the medial compartment of the right knee. History of arthroscopy right knee. Report of injury as a child but unclear exactly what that was.  :I am aware of 12/01/18 Rx by Dr. Jennette Kettle.  While wife was hospitalized,  narcotic Rx went missing when in-home sitter left.  To my knowledge, this is the first mi   Automatic implantable cardioverter-defibrillator in situ 07/15/2013   Blind in both eyes 06/15/2013   Right Sees Dr Dione Booze   Cataracts, bilateral    right   Chronic gout due to renal impairment of multiple sites without tophus 04/12/2020   Chronic systolic heart failure (HCC) 02/18/2007   Qualifier: Diagnosis of  By: Bryson Ha MD, Gwendalyn Ege    Congestive heart failure (HCC)    a. s/p MDT single chamber ICD    Diabetes mellitus    does not check blood sugars at home   Diverticulosis    Encounter for chronic pain management 05/17/2014   Indication for chronic opioid: severe arthritis RIGHTknee / low back Medication and dose: hydrocodone---uses prn # pills per month: gets #120 pills to last TWO months Last UDS date: 05/17/2014 Pain contract signed (Y/N): Y 05/17/2014 Date narcotic database last reviewed (include red flags): 05/17/2014    Glaucoma    bilateral   Glaucoma 11/07/2015   Severe See Dr. Dione Booze   H/O TIA (transient ischemic attack) and stroke    Hyperlipidemia    Hyperlipidemia with target low density lipoprotein (LDL) cholesterol less than 100 mg/dL 1/61/0960   Hypertension    HYPERTENSION, BENIGN 02/18/2007   Qualifier: Diagnosis of  By: Jennette Kettle MD, Ronald Lobo of both feet 04/12/2020   NICM (nonischemic cardiomyopathy) (HCC) 05/30/2015   Obesity    OBESITY, MORBID 05/21/2010   Qualifier: Diagnosis of  By: Graciela Husbands, MD, Susie Cassette    Obstructive sleep apnea 03/27/2009   Qualifier: Diagnosis of  By: Sherral Hammers, RN, BSN, Melanie     Paroxysmal atrial fibrillation (HCC) 09/21/2019   Paroxysmal VT (HCC) 06/22/2019   RENAL DISEASE, CHRONIC, STAGE III 02/18/2007   SHOULDER PAIN, LEFT 08/01/2009   History of left shoulder surgery.     Single implantable cardioverter-defibrillator (ICD) in situ 06/17/2011   Type 2 diabetes mellitus with stage 4 chronic kidney disease, without long-term current use  of insulin (HCC) 08/25/2007    Surgical History: Past Surgical History:  Procedure Laterality Date   CARDIAC CATHETERIZATION  10/24/2003   EF of 45-50%   CARDIAC DEFIBRILLATOR PLACEMENT  2008   CATARACT EXTRACTION  2012   right   EP IMPLANTABLE DEVICE N/A 05/30/2015   Procedure: ICD  Musician;  Surgeon: Duke Salvia, MD;  Location: [facility] INVASIVE CV LAB;  Service: Cardiovascular;  Laterality: N/A;   KNEE SURGERY Right 2000   SHOULDER SURGERY  2012   left     Medications:   Current Facility-Administered Medications:    acetaminophen (TYLENOL) tablet 650 mg, 650 mg, Oral, Q4H PRN, Shalhoub, Deno Lunger, MD, 650 mg at 07/10/23 1149   allopurinol (ZYLOPRIM) tablet 50 mg, 50 mg, Oral, Daily, Eduard Clos, MD, 50 mg at 07/13/23 1034   apixaban (ELIQUIS) tablet 5 mg, 5 mg, Oral, BID, Eduard Clos, MD, 5 mg at 07/13/23 1034   brimonidine (ALPHAGAN) 0.2 % ophthalmic solution 1 drop, 1 drop, Both Eyes, BID, Eduard Clos, MD, 1 drop at 07/13/23 1036   carvedilol (COREG) tablet 25 mg, 25 mg, Oral, BID, Eduard Clos, MD, 25 mg at 07/13/23 1034   dapagliflozin propanediol (FARXIGA) tablet 10 mg, 10 mg, Oral, Daily, Toniann Fail, Arshad N, MD, 10 mg at 07/13/23 1035   dorzolamide-timolol (COSOPT) 2-0.5 % ophthalmic solution 1 drop, 1 drop, Both Eyes, BID, Eduard Clos, MD, 1 drop at 07/13/23 1036   insulin aspart (novoLOG) injection 0-10 Units, 0-10 Units, Subcutaneous, TID AC & HS, Shalhoub, Deno Lunger, MD, 2 Units at 07/13/23 1208   insulin glargine-yfgn (SEMGLEE) injection 25 Units, 25 Units, Subcutaneous, Daily, Eduard Clos, MD, 25 Units at 07/13/23 1035   isosorbide mononitrate (ISMO) tablet 20 mg, 20 mg, Oral, BID, Midge Minium N, MD, 20 mg at 07/13/23 1035   latanoprost (XALATAN) 0.005 % ophthalmic solution 1 drop, 1 drop, Both Eyes, QPM, Eduard Clos, MD, 1 drop at 07/12/23 1829   mometasone-formoterol (DULERA) 200-5 MCG/ACT  inhaler 2 puff, 2 puff, Inhalation, Daily, Eduard Clos, MD, 2 puff at 07/13/23 0830   neomycin-polymyxin b-dexamethasone (MAXITROL) ophthalmic ointment 1 Application, 1 Application, Both Eyes, QHS, Shalhoub, Deno Lunger, MD, 1 Application at 07/12/23 2220   nitroGLYCERIN (NITROSTAT) SL tablet 0.4 mg, 0.4 mg, Sublingual, Q5 min PRN, Eduard Clos, MD   nystatin (MYCOSTATIN/NYSTOP) topical powder, , Topical, BID, Shalhoub, Deno Lunger, MD, Given at 07/13/23 1035   risperiDONE (RISPERDAL) tablet 0.5 mg, 0.5 mg, Oral, BID, Lord, Jamison Y, NP, 0.5 mg at 07/13/23 1034   rosuvastatin (CRESTOR) tablet 10 mg, 10 mg, Oral, Daily, Eduard Clos, MD, 10 mg at 07/13/23 1035   sacubitril-valsartan (ENTRESTO) 49-51 mg per tablet, 1 tablet, Oral, BID, Eduard Clos, MD, 1 tablet at 07/13/23 1035  Allergies: Allergies  Allergen Reactions   Bee Pollen Anaphylaxis, Itching and Swelling   Bee Venom Anaphylaxis   Shellfish Allergy Anaphylaxis   Nsaids Other (See Comments)    CKD and CHF    Maryagnes Amos, FNP

## 2023-07-13 NOTE — Plan of Care (Signed)

## 2023-07-13 NOTE — TOC Progression Note (Signed)
 Transition of Care [facility]) - Progression Note    Patient Details  Name: [name] MRN: [id number] Date of Birth: 1961-04-06  Transition of Care [facility]) CM/SW Contact  Beckie Busing, RN Phone Number:[phone]  07/13/2023, 11:33 AM  Clinical Narrative:    CM received message that patient will need to be faxed out due to no beds available at [facility]. Per documentation patient has already been faxed out to .  Destination  Service Provider Request Status Services Address Phone Fax Patient Preferred  [facility] Pending - Request Sent -- [address] [place] [address] [phone] --  [facility] Pending - Request Sent -- [address] [place] [address] [phone] --  [facility] Pending - Request Sent -- [address], [place] [address] [phone] --  [facility] Pending - Request Sent -- [address] [place] [address] [phone] --  [facility] Pending - Request Sent -- [address] [place] [address] [phone] --  [facility] Pending - Request Sent -- [facility] [place] [phone] --  [facility] Pending - Request [address] [place] [address] [phone] --  [facility] Pending - Request Sent -- [address] [place] [address] [phone] --  [facility] Pending - Request Sent -- [address], [place] [address] [phone] --  [facility] Pending - Request Sent -- [address] [place] [address] [phone] --  [facility] Pending - Request Sent -- [address], [place] [address] [phone] --  [facility] Pending - Request Sent -- [address] [place] [address] [phone] --  [facility] Pending - Request Sent -- [address] [place], [place] [address] [phone] --  [facility] Pending - Request Sent -- [address] [place] [address] [phone] --  [facility] Pending - Request Sent -- [address] [place] [address] [phone] --  [facility] Pending - Request Sent -- [address] [place], [place] [address] [phone] --  [facility] Pending - Request Sent -- [address] [place] [address] [phone] --  [facility] Pending - Request Sent -- [address] [place] [address] [phone] --  [facility] Pending - Request Sent -- [address] [place] [address] [phone] --  [facility] Pending - Request Sent -- [address], [place] [address] [phone] --  [facility] Pending - Request Sent -- [address] [place] [address] [phone] --  [facility] Pending - Request Sent -- [address] [place] [address] [phone] --  [facility] Pending - Request Sent -- [address] [place] [address] [phone] --  [facility] Pending - Request Sent -- [address], [place] [address] [phone] --  [facility] Pending - Request Sent -- [address] [place] [address] [phone]      Expected Discharge Plan: Psychiatric Hospital Barriers to Discharge: Continued Medical Work up  Expected Discharge Plan and Services In-house  Referral: Clinical Social Work   Post Acute Care Choice: NA  Living arrangements for the past 2 months: Apartment                 DME Arranged: N/A DME Agency: NA                   Social Determinants of Health (SDOH) Interventions SDOH Screenings   Food Insecurity: No Food Insecurity (07/10/2023)  Housing: Low Risk  (07/10/2023)  Transportation Needs: No Transportation Needs (07/10/2023)  Utilities: Not At Risk (07/10/2023)  Alcohol Screen: Low Risk  (11/24/2022)  Depression (PHQ2-9): Low Risk  (04/22/2023)  Recent Concern: Depression (PHQ2-9) - High Risk (03/17/2023)  Financial Resource Strain: Low Risk  (11/24/2022)  Physical Activity: Inactive (11/24/2022)  Social Connections: Socially Isolated (07/10/2023)  Stress: No Stress Concern Present (11/24/2022)  Tobacco Use: Low Risk  (07/10/2023)  Health Literacy: Inadequate Health Literacy (11/24/2022)    Readmission Risk Interventions     No data to display

## 2023-07-14 DIAGNOSIS — R441 Visual hallucinations: Secondary | ICD-10-CM | POA: Diagnosis not present

## 2023-07-14 DIAGNOSIS — R45851 Suicidal ideations: Secondary | ICD-10-CM | POA: Diagnosis not present

## 2023-07-14 LAB — CBC WITH DIFFERENTIAL/PLATELET
Abs Immature Granulocytes: 0.04 10*3/uL (ref 0.00–0.07)
Basophils Absolute: 0 10*3/uL (ref 0.0–0.1)
Basophils Relative: 1 %
Eosinophils Absolute: 0.2 10*3/uL (ref 0.0–0.5)
Eosinophils Relative: 3 %
HCT: 44.7 % (ref 39.0–52.0)
Hemoglobin: 14.8 g/dL (ref 13.0–17.0)
Immature Granulocytes: 1 %
Lymphocytes Relative: 26 %
Lymphs Abs: 2.2 10*3/uL (ref 0.7–4.0)
MCH: 29.8 pg (ref 26.0–34.0)
MCHC: 33.1 g/dL (ref 30.0–36.0)
MCV: 90.1 fL (ref 80.0–100.0)
Monocytes Absolute: 0.7 10*3/uL (ref 0.1–1.0)
Monocytes Relative: 8 %
Neutro Abs: 5.2 10*3/uL (ref 1.7–7.7)
Neutrophils Relative %: 61 %
Platelets: 218 10*3/uL (ref 150–400)
RBC: 4.96 MIL/uL (ref 4.22–5.81)
RDW: 14.4 % (ref 11.5–15.5)
WBC: 8.4 10*3/uL (ref 4.0–10.5)
nRBC: 0 % (ref 0.0–0.2)

## 2023-07-14 LAB — BASIC METABOLIC PANEL WITH GFR
Anion gap: 7 (ref 5–15)
BUN: 19 mg/dL (ref 8–23)
CO2: 21 mmol/L — ABNORMAL LOW (ref 22–32)
Calcium: 8.4 mg/dL — ABNORMAL LOW (ref 8.9–10.3)
Chloride: 109 mmol/L (ref 98–111)
Creatinine, Ser: 1.4 mg/dL — ABNORMAL HIGH (ref 0.61–1.24)
GFR, Estimated: 56 mL/min — ABNORMAL LOW (ref >60)
Glucose, Bld: 117 mg/dL — ABNORMAL HIGH (ref 70–99)
Potassium: 3.2 mmol/L — ABNORMAL LOW (ref 3.5–5.1)
Sodium: 137 mmol/L (ref 135–145)

## 2023-07-14 LAB — GLUCOSE, CAPILLARY
Glucose-Capillary: 111 mg/dL — ABNORMAL HIGH (ref 70–99)
Glucose-Capillary: 145 mg/dL — ABNORMAL HIGH (ref 70–99)
Glucose-Capillary: 136 mg/dL — ABNORMAL HIGH (ref 70–99)
Glucose-Capillary: 149 mg/dL — ABNORMAL HIGH (ref 70–99)

## 2023-07-14 MED ORDER — POTASSIUM CHLORIDE CRYS ER 20 MEQ PO TBCR
40.0000 meq | EXTENDED_RELEASE_TABLET | Freq: Once | ORAL | Status: AC
Start: 2023-07-14 — End: 2023-07-14
  Administered 2023-07-14: 40 meq via ORAL
  Filled 2023-07-14: qty 2

## 2023-07-14 NOTE — Progress Notes (Signed)
 Mobility Specialist - Progress Note   07/14/23 1114  Mobility  Activity Ambulated with assistance in hallway  Level of Assistance Contact guard assist, steadying assist  Assistive Device Front wheel walker  Distance Ambulated (ft) 120 ft  Activity Response Tolerated well  Mobility Referral Yes  Mobility visit 1 Mobility  Mobility Specialist Start Time (ACUTE ONLY) 1041  Mobility Specialist Stop Time (ACUTE ONLY) 1100  Mobility Specialist Time Calculation (min) (ACUTE ONLY) 19 min   Pt received in bed and agreeable to mobility. Pt required verbal cues for general direction and walker placement. No complaints during session. Pt to bed after session with all needs met.  Sitter in room.  [facility]

## 2023-07-14 NOTE — Plan of Care (Signed)
  Problem: Education: Goal: Knowledge of General Education information will improve Description: Including pain rating scale, medication(s)/side effects and non-pharmacologic comfort measures Outcome: Progressing   Problem: Health Behavior/Discharge Planning: Goal: Ability to manage health-related needs will improve Outcome: Progressing   Problem: Clinical Measurements: Goal: Ability to maintain clinical measurements within normal limits will improve Outcome: Progressing Goal: Will remain free from infection Outcome: Progressing Goal: Diagnostic test results will improve Outcome: Progressing Goal: Respiratory complications will improve Outcome: Progressing Goal: Cardiovascular complication will be avoided Outcome: Progressing   Problem: Activity: Goal: Risk for activity intolerance will decrease Outcome: Progressing   Problem: Nutrition: Goal: Adequate nutrition will be maintained Outcome: Progressing   Problem: Coping: Goal: Level of anxiety will decrease Outcome: Progressing   Problem: Elimination: Goal: Will not experience complications related to bowel motility Outcome: Progressing Goal: Will not experience complications related to urinary retention Outcome: Progressing   Problem: Pain Managment: Goal: General experience of comfort will improve and/or be controlled Outcome: Progressing   Problem: Safety: Goal: Ability to remain free from injury will improve Outcome: Progressing   Problem: Skin Integrity: Goal: Risk for impaired skin integrity will decrease Outcome: Progressing   Problem: Metabolic: Goal: Ability to maintain appropriate glucose levels will improve Outcome: Progressing   Problem: Nutritional: Goal: Maintenance of adequate nutrition will improve Outcome: Progressing Goal: Progress toward achieving an optimal weight will improve Outcome: Progressing

## 2023-07-14 NOTE — Progress Notes (Signed)
 Progress Note   Patient: [name] AVW:[id number] DOB: [birth year] DOA: 07/09/2023     0 DOS: the patient was seen and examined on 07/14/2023   Brief hospital course: 63yo with h/o NICM, DM, stage 3 CKD, HTN, afib, and B chronic blindness who presented on 4/3 with depression, SI, visual hallucinations.  Due to concern for chest pain, he was admitted to [facility] on suicide precautions.  Dr. Sharyn Lull (cardiology) consulted and cleared him.  He is medically stable for inpatient [facility] admission, on IVC since 4/5.  Assessment and Plan:  Suicidal ideation Patient continues to report suicidal ideation, none this AM Patient additionally reporting visual hallucinations although he is essentially blind Psychiatry following, discontinued Seroquel and started Risperdal 0.5 mg twice daily Psychiatry recommending that this patient be placed in a geriatric psych behavioral health bed whether it be at a [facility] facility or elsewhere - currently refused for [facility] given his visual impairment Patient has refused to go anywhere but a [facility] facility and therefore has had an IVC initiated since patient is not complying with care plan and is a threat to himself [facility] team consulted to help with placement He is medically stable for inpatient [facility] placement   Chest pain Atypical chest discomfort, resolved Slight elevation of troponin, flat No associated dynamic ST segment change based on review of EKG Patient has been evaluated by cardiology who feels that ACS/ischemia is unlikely and has signed off Workup for other etiologies of chest discomfort has been negative with normal D-dimer and chest x-ray   Visual hallucinations Patient additionally reporting visual hallucinations although he is essentially blind Psychiatry following, their assistance is appreciated Changed Seroquel to Risperdal 0.5 mg twice daily   Type 2 diabetes mellitus with long-term current use of insulin  Resume home regimen of insulin Accu-Cheks  before every meal and nightly with sliding scale insulin Continue farxiga Hemoglobin A1c 7.1% 04/22/2023, good control   Chronic kidney disease, stage 3a  Creatinine at/near baseline Minimizing nephrotoxic agents as much as possible   Essential Hypertension Continue Coreg, Entresto   Paroxysmal atrial fibrillation  Rate controlled Patient currently in controlled normal sinus rhythm Continue home regimen of Eliquis and Coreg   NICM (nonischemic cardiomyopathy)  Euvolemic Continue Entresto         Consultants: Psychiatry Cardiology [facility] team   Procedures: None   Antibiotics: None      Subjective: More upbeat today, went for a walk.  No specific complaints.   Objective: Vitals:   07/13/23 2141 07/14/23 0542  BP: (!) 132/91 (!) 140/95  Pulse: 64 (!) 59  Resp: 20 17  Temp: 98.3 F (36.8 C) 98.3 F (36.8 C)  SpO2: 94% 94%    Intake/Output Summary (Last 24 hours) at 07/14/2023 0904 Last data filed at 07/14/2023 0743 Gross per 24 hour  Intake 960 ml  Output 1100 ml  Net -140 ml   Filed Weights   07/10/23 0529  Weight: (!) 143 kg    Exam:  General:  Appears calm and comfortable and is in NAD Eyes:  chronic visual impairment ENT:  grossly normal hearing, lips & tongue, mmm Cardiovascular:  RRR. No LE edema.  Respiratory:   CTA bilaterally with no wheezes/rales/rhonchi.  Normal respiratory effort. Abdomen:  soft, NT, ND Skin:  no rash or induration seen on limited exam Musculoskeletal:  grossly normal tone BUE/BLE, good ROM, no bony abnormality Psychiatric:  blunted mood and affect, speech fluent and appropriate, AOx3 Neurologic:  CN 2-12 grossly intact, moves all  extremities in coordinated fashion  Data Reviewed: I have reviewed the patient's lab results since admission.  Pertinent labs for today include:   K+ 3.2 Glucose 117 BUN 19/Creatinine 1.4/GFR 56, better than baseline Normal CBC     Family Communication: None  present  Disposition: Status is: Observation The patient remains OBS appropriate and will d/c before 2 midnights.     Time spent: 35 minutes  Unresulted Labs (From admission, onward)    None        Author: Jonah Blue, MD 07/14/2023 9:04 AM  For on call review [email or web address].

## 2023-07-14 NOTE — TOC Progression Note (Signed)
 Transition of Care [facility]) - Progression Note    Patient Details  Name: [name] MRN: [id number] Date of Birth: 10-Mar-1961  Transition [facility] CM/SW Contact  Beckie Busing, RN Phone [phone]  07/14/2023, 4:20 PM  Clinical Narrative:    Notice of commitment status change form has been completed and submitted. Envelope # [id number]. Patient is no longer IVC.    Expected Discharge Plan: Psychiatric Hospital Barriers to Discharge: Continued Medical Work up  Expected Discharge Plan and Services In-house Referral: Clinical Social Work   Post Acute Care Choice: NA Living arrangements for the past 2 months: Apartment                 DME Arranged: N/A DME Agency: NA                   Social Determinants of Health (SDOH) Interventions SDOH Screenings   Food Insecurity: No Food Insecurity (07/10/2023)  Housing: Low Risk  (07/10/2023)  Transportation Needs: No Transportation Needs (07/10/2023)  Utilities: Not At Risk (07/10/2023)  Alcohol Screen: Low Risk  (11/24/2022)  Depression (PHQ2-9): Low Risk  (04/22/2023)  Recent Concern: Depression (PHQ2-9) - High Risk (03/17/2023)  Financial Resource Strain: Low Risk  (11/24/2022)  Physical Activity: Inactive (11/24/2022)  Social Connections: Socially Isolated (07/10/2023)  Stress: No Stress Concern Present (11/24/2022)  Tobacco Use: Low Risk  (07/10/2023)  Health Literacy: Inadequate Health Literacy (11/24/2022)    Readmission Risk Interventions     No data to display

## 2023-07-14 NOTE — Consult Note (Signed)
 [facility] Psychiatric Consult Initial  Patient Name: .[name]  MRN: [id number]  DOB: Aug 31, 1960  Consult Order details:  Orders (From admission, onward)     Start     Ordered   07/10/23 0540  IP CONSULT TO PSYCHIATRY       Ordering Provider: Eduard Clos, MD  Provider:  (Not yet assigned)  Question Answer Comment  Location [facility]   Reason for Consult? Please consult for suicidal thoughts.      07/10/23 0539             Mode of Visit: In person    Psychiatry Consult Evaluation  Service Date: July 14, 2023 LOS:  LOS: 0 days  Chief Complaint "I was seeing people and bright lights".  Primary Psychiatric Diagnoses  Visual hallucinations   Assessment  [name] is a 63 y.o. male admitted: Medicallyfor 07/09/2023 10:57 PM for chest pain. He carries the psychiatric diagnoses of VH for the past 3-4 years and has a past medical history of  nonischemic cardiomyopathy, diabetes mellitus type 2, chronic kidney disease stage III, hypertension, paroxysmal atrial fibrillation, blindness in both eyes .   His current presentation of seeing people and bright lights despite being blind is most consistent with visual hallucinations. He meets criteria for visual hallucinations based on seeing people and bright lights for the past 3-4 years with paranoia that these people are after him.  Current outpatient psychotropic medications include Serqouel and historically he has had a non response recently to these medications. He was compliant with medications prior to admission as evidenced by self-report. On initial examination, patient was not having hallucinations as "I haven't been outside", positive for hallucinations predominately when outside, sometimes within his home. Please see plan below for detailed recommendations.  Marland Kitchen   07/12/2023: Patient is alert and oriented x4, calm and cooperative, very attentive and engages well with psychiatric nurse  practitioner.   On today's evaluation he is observed to be lying in the room. He appears to be receptive to current situation at hand to include searching for a new hospital bed. He is apprehensive about going out of the system due to transportation. He is able to have a linear conversation with this provider and laughs sometimes. He does not display any acute psychosis despite his admission of hallucinations. He denies any side effects at this time as it pertains to the Risperdal. He admits to declining the bed at [facility] hospital due to knowledge deficit"I didn't know that was a [place].".   There does not appear to be any evidence of confabulation, psychosis, delusional thinking.  He does not appear to be responding to internal stimuli, external stimuli.  He is able to engage well and follow all commands.  He further denies any thoughts to want to harm himself or other people.  He has been compliant with his psychotropic medications outside of the hospital. This does not appear to be acute exacerbation of schizophrenia, however patient is nearing his baseline from a psychiatric standpoint.  There is evidence of improvement in the patient's symptoms, particularly in his ability to communicate and engage in appropriate conversation. However, he still requires inpatient care to address the acute exacerbation of psychiatric symptoms and ensure continued stabilization. At this time, I recommend that the patient remain admitted for further evaluation and treatment.  04/07/02025:   The patient was seen and assessed by this provider. He was alert and oriented to person, place, and time, and  responded appropriately. He opened his eyes when his name was called and engaged in conversation. He reports that he went to sleep after 9p.m. and slept through until about 5 a.m. He states that his appetite is good, and he even went for a walk with mobility today. His suicide sitter at the bedside reported no concerns  at this time. The patient denies experiencing hallucinations or suicidal ideations. He is currently taking risperidone 0.5 mg orally twice a day and denies any side effects or adverse reactions. He feels the medication is effectively helping with his hallucinations, as evidenced by a smile on his face. There is no indication of delusional thought processes, and he is not responding to either internal or external stimuli. He is able to converse coherently with linear, goal-directed thoughts, without distractibility or preoccupation. He denies any suicidal ideations and has been free of acute psychosis for over 48 hours.  Given his current improvement and nearing stability, the decision has been made to discontinue the suicide sitter, as the patient is independent with activities of daily living (ADLs) and remains calm and cooperative.  Diagnoses:  Active Hospital problems: Principal Problem:   Suicidal ideation Active Problems:   Obstructive sleep apnea   Essential Hypertension   Chronic kidney disease, stage 3a (HCC)   Type 2 diabetes mellitus with stage 3b chronic kidney disease, with long-term current use of insulin (HCC)   OBESITY, MORBID   Blind in both eyes   NICM (nonischemic cardiomyopathy) (HCC)   Paroxysmal atrial fibrillation (HCC)   Visual hallucinations   Chest pain    Plan   ## Psychiatric Medication Recommendations:  -Continue Risperdal 0.5 mg BID  ## Medical Decision Making Capacity: Not specifically addressed in this encounter  ## Further Work-up:  -- most recent EKG on 4/4  had QtC of 458 -- Pertinent labwork reviewed earlier this admission includes: CBC with diff, troponin, chem panel, HIV test, toxicology   ## Disposition:-- Plan Post Discharge/Psychiatric Care Follow-up resources Disposition is pending at this time. If patient maintains stability will dc home tomorrow. Wife is aware see below.   ## Behavioral / Environmental: - No specific recommendations at  this time.  -Patient no longer needs a suicide sitter,  -IVC rescinded at this time. May continue safety sitter due to visual impairments.    ## Safety and Observation Level:  - Based on my clinical evaluation, I estimate the patient to be at low risk of self harm in the current setting. - At this time, we recommend  routine. This decision is based on my review of the chart including patient's history and current presentation, interview of the patient, mental status examination, and consideration of suicide risk including evaluating suicidal ideation, plan, intent, suicidal or self-harm behaviors, risk factors, and protective factors. This judgment is based on our ability to directly address suicide risk, implement suicide prevention strategies, and develop a safety plan while the patient is in the clinical setting. Please contact our team if there is a concern that risk level has changed.  CSSR Risk Category: Moderate  Suicide Risk Assessment: Patient has following modifiable risk factors for suicide: active suicidal ideation and untreated depression, which we are addressing by starting medications and seeing inpatient hospitalization unless symptoms resolve prior to medical clearance. Patient has following non-modifiable or demographic risk factors for suicide: male gender and history of suicide attempt Patient has the following protective factors against suicide: Supportive family and no history of NSSIB  Thank you for this consult request.  Recommendations have been communicated to the primary team.  We will continue to follow at this time.   Maryagnes Amos, FNP       History of Present Illness  Relevant Aspects of [facility] Course:  Admitted on 07/09/2023 for chest pain. They are currently being followed by cardiology.   Patient Report:  Denies hallucinations. Denies suicidal ideations and or homicidal ideations.   Psych ROS:  Depression: mild Anxiety:  low to  mild Mania (lifetime and current): denied Psychosis: (lifetime and current): denies  Collateral information: 07/14/2023 Efforts were made to speak with the patient's wife, but she remained guarded and did not fully understand the process. Throughout the conversation, she overtalked the provider, repeatedly interrupting, and only briefly apologized before continuing to dominate the conversation. She expressed concerns about the patient experiencing hallucinations and stated that he should not return home at this time. She mentioned that she would send him back as soon as he is discharged because she is tired of dealing with his condition, which she has been managing for the past two months. She emphasized that he needs further help.  The wife stated that it is not the patient's fault that visual impairment services are not available and voiced frustration regarding the patient being allowed to decline a bed, noting that no other facility had offered him one. She threatened to contact a news station and mobile crisis if the patient is sent home without proper support. She also criticized the plan to continue risperidone despite his ongoing hallucinations, describing it as "senseless" and questioning the rationale for prescribing the medication. Furthermore, she reported that the patient has been upset about missing belongings and expressed confusion about their whereabouts.  The wife repeatedly insisted that the patient should not be sent home on medication alone and that efforts should continue to find him an appropriate bed. Attempts were made to provide clarification, address her concerns, and explain the rationale behind the current plan. However, the conversation was met with a blunt, condescending, and argumentative tone, with the wife not allowing the provider to speak. As a result, all questions were answered to the best of my ability, and the call was concluded by mutual agreement.  Collateral  information was obtained from the wife, but her communication style was challenging and hindered productive dialogue.  Review of Systems  Psychiatric/Behavioral:  Positive for depression. Negative for hallucinations. The patient is nervous/anxious.   All other systems reviewed and are negative.    Psychiatric and Social History  Psychiatric History:  Information collected from patient and chart.  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 rate 18, height 5'  2" (1.575 m), weight (!) 143 kg, SpO2 95%. Body mass index is 57.66 kg/m.  Physical Exam  Mental Status Exam: General Appearance: Casual  Orientation:  Full (Time, Place, and Person)  Memory:  Immediate;   Good Recent;   Good Remote;   Good  Concentration:  Concentration: Good and Attention Span: Good  Recall:  Good  Attention  Good  Eye Contact:  poor, the client is blind  Speech:  Clear and Coherent  Language:  Good  Volume:  Normal  Mood: depression and anxiety  Affect:  Blunt  Thought Process:  Coherent  Thought Content:  Logical prior to hospitalization   Suicidal Thoughts:  No  Homicidal Thoughts:  No  Judgement:  Fair  Insight:  Fair  Psychomotor Activity:  Decreased  Akathisia:  No  Fund of Knowledge:  Good      Assets:  Housing Leisure Time Resilience Social Support  Cognition:  WNL  ADL's:  Intact  AIMS (if indicated):        Other History   These have been pulled in through the EMR, reviewed, and updated if appropriate.  Family History:  The patient's family history includes Colon cancer in his father.  Medical History: Past Medical History:  Diagnosis Date   Arthritis    Arthritis of knee, degenerative 10/03/2011   Right > LEFT KNEE oa X-rays were not standing but showed some mild sclerosis and joint space loss of the medial compartment of the right knee. History of arthroscopy right knee. Report of injury as a child but unclear exactly what that was.  :I am aware of 12/01/18 Rx by Dr. Jennette Kettle.  While wife was hospitalized, narcotic Rx went missing when in-home sitter left.  To my knowledge, this is the first mi   Automatic implantable cardioverter-defibrillator in situ 07/15/2013   Blind in both eyes 06/15/2013   Right Sees Dr Dione Booze   Cataracts, bilateral    right   Chronic gout due to renal impairment of multiple sites without tophus 04/12/2020   Chronic systolic heart failure (HCC) 02/18/2007   Qualifier: Diagnosis of  By: Bryson Ha MD, Gwendalyn Ege    Congestive heart failure (HCC)    a. s/p MDT single chamber ICD    Diabetes mellitus    does not check blood sugars at home   Diverticulosis    Encounter for chronic pain management 05/17/2014   Indication for chronic opioid: severe arthritis RIGHTknee / low back Medication and dose: hydrocodone---uses prn # pills per month: gets #120 pills to last TWO months Last UDS date: 05/17/2014 Pain contract signed (Y/N): Y 05/17/2014 Date narcotic database last reviewed (include red flags): 05/17/2014    Glaucoma    bilateral   Glaucoma 11/07/2015   Severe See Dr. Dione Booze   H/O TIA  (transient ischemic attack) and stroke    Hyperlipidemia    Hyperlipidemia with target low density lipoprotein (LDL) cholesterol less than 100 mg/dL 9/62/9528   Hypertension    HYPERTENSION, BENIGN 02/18/2007   Qualifier: Diagnosis of  By: Jennette Kettle MD, Ronald Lobo of both feet 04/12/2020   NICM (nonischemic cardiomyopathy) (HCC) 05/30/2015   Obesity    OBESITY, MORBID 05/21/2010   Qualifier: Diagnosis of  By: Graciela Husbands, MD, Susie Cassette    Obstructive sleep apnea 03/27/2009   Qualifier: Diagnosis of  By: Sherral Hammers, RN, BSN, Melanie     Paroxysmal atrial fibrillation (HCC) 09/21/2019   Paroxysmal VT (HCC) 06/22/2019   RENAL DISEASE, CHRONIC, STAGE III  02/18/2007   SHOULDER PAIN, LEFT 08/01/2009   History of left shoulder surgery.     Single implantable cardioverter-defibrillator (ICD) in situ 06/17/2011   Type 2 diabetes mellitus with stage 4 chronic kidney disease, without long-term current use of insulin (HCC) 08/25/2007    Surgical History: Past Surgical History:  Procedure Laterality Date   CARDIAC CATHETERIZATION  10/24/2003   EF of 45-50%   CARDIAC DEFIBRILLATOR PLACEMENT  2008   CATARACT EXTRACTION  2012   right   EP IMPLANTABLE DEVICE N/A 05/30/2015   Procedure: ICD Generator Changeout;  Surgeon: Duke Salvia, MD;  Location: [facility] INVASIVE CV LAB;  Service: Cardiovascular;  Laterality: N/A;   KNEE SURGERY Right 2000   SHOULDER SURGERY  2012   left     Medications:   Current Facility-Administered Medications:    acetaminophen (TYLENOL) tablet 650 mg, 650 mg, Oral, Q4H PRN, Shalhoub, Deno Lunger, MD, 650 mg at 07/10/23 1149   allopurinol (ZYLOPRIM) tablet 50 mg, 50 mg, Oral, Daily, Eduard Clos, MD, 50 mg at 07/14/23 1021   apixaban (ELIQUIS) tablet 5 mg, 5 mg, Oral, BID, Eduard Clos, MD, 5 mg at 07/14/23 1022   brimonidine (ALPHAGAN) 0.2 % ophthalmic solution 1 drop, 1 drop, Both Eyes, BID, Eduard Clos, MD, 1 drop at 07/14/23 1025   carvedilol  (COREG) tablet 25 mg, 25 mg, Oral, BID, Eduard Clos, MD, 25 mg at 07/14/23 1021   dapagliflozin propanediol (FARXIGA) tablet 10 mg, 10 mg, Oral, Daily, Toniann Fail, Arshad N, MD, 10 mg at 07/14/23 1022   dorzolamide-timolol (COSOPT) 2-0.5 % ophthalmic solution 1 drop, 1 drop, Both Eyes, BID, Midge Minium N, MD, 1 drop at 07/14/23 1026   insulin aspart (novoLOG) injection 0-10 Units, 0-10 Units, Subcutaneous, TID AC & HS, Shalhoub, Deno Lunger, MD, 2 Units at 07/13/23 1208   insulin glargine-yfgn (SEMGLEE) injection 25 Units, 25 Units, Subcutaneous, Daily, Eduard Clos, MD, 25 Units at 07/14/23 1024   isosorbide mononitrate (ISMO) tablet 20 mg, 20 mg, Oral, BID, Eduard Clos, MD, 20 mg at 07/14/23 1023   latanoprost (XALATAN) 0.005 % ophthalmic solution 1 drop, 1 drop, Both Eyes, QPM, Eduard Clos, MD, 1 drop at 07/13/23 1802   mometasone-formoterol (DULERA) 200-5 MCG/ACT inhaler 2 puff, 2 puff, Inhalation, Daily, Eduard Clos, MD, 2 puff at 07/14/23 1024   neomycin-polymyxin b-dexamethasone (MAXITROL) ophthalmic ointment 1 Application, 1 Application, Both Eyes, QHS, Shalhoub, Deno Lunger, MD, 1 Application at 07/13/23 1957   nitroGLYCERIN (NITROSTAT) SL tablet 0.4 mg, 0.4 mg, Sublingual, Q5 min PRN, Eduard Clos, MD   nystatin (MYCOSTATIN/NYSTOP) topical powder, , Topical, BID, Shalhoub, Deno Lunger, MD, Given at 07/14/23 1023   Oral care mouth rinse, 15 mL, Mouth Rinse, PRN, Jonah Blue, MD   risperiDONE (RISPERDAL) tablet 0.5 mg, 0.5 mg, Oral, BID, Lord, Jamison Y, NP, 0.5 mg at 07/14/23 1021   rosuvastatin (CRESTOR) tablet 10 mg, 10 mg, Oral, Daily, Eduard Clos, MD, 10 mg at 07/14/23 1022   sacubitril-valsartan (ENTRESTO) 49-51 mg per tablet, 1 tablet, Oral, BID, Eduard Clos, MD, 1 tablet at 07/14/23 1023  Allergies: Allergies  Allergen Reactions   Bee Pollen Anaphylaxis, Itching and Swelling   Bee Venom Anaphylaxis    Shellfish Allergy Anaphylaxis   Nsaids Other (See Comments)    CKD and CHF    Maryagnes Amos, FNP

## 2023-07-14 NOTE — Plan of Care (Signed)
  Problem: Education: Goal: Knowledge of General Education information will improve Description: Including pain rating scale, medication(s)/side effects and non-pharmacologic comfort measures Outcome: Progressing   Problem: Health Behavior/Discharge Planning: Goal: Ability to manage health-related needs will improve Outcome: Progressing   Problem: Clinical Measurements: Goal: Ability to maintain clinical measurements within normal limits will improve Outcome: Progressing Goal: Will remain free from infection Outcome: Progressing Goal: Diagnostic test results will improve Outcome: Progressing Goal: Respiratory complications will improve Outcome: Progressing Goal: Cardiovascular complication will be avoided Outcome: Progressing   Problem: Nutrition: Goal: Adequate nutrition will be maintained Outcome: Progressing   Problem: Coping: Goal: Level of anxiety will decrease Outcome: Progressing   Problem: Elimination: Goal: Will not experience complications related to bowel motility Outcome: Progressing Goal: Will not experience complications related to urinary retention Outcome: Progressing   Problem: Pain Managment: Goal: General experience of comfort will improve and/or be controlled Outcome: Progressing   Problem: Safety: Goal: Ability to remain free from injury will improve Outcome: Progressing   Problem: Skin Integrity: Goal: Risk for impaired skin integrity will decrease Outcome: Progressing   Problem: Education: Goal: Ability to describe self-care measures that may prevent or decrease complications (Diabetes Survival Skills Education) will improve Outcome: Progressing Goal: Individualized Educational Video(s) Outcome: Progressing   Problem: Coping: Goal: Ability to adjust to condition or change in health will improve Outcome: Progressing   Problem: Fluid Volume: Goal: Ability to maintain a balanced intake and output will improve Outcome: Progressing    Problem: Health Behavior/Discharge Planning: Goal: Ability to identify and utilize available resources and services will improve Outcome: Progressing Goal: Ability to manage health-related needs will improve Outcome: Progressing   Problem: Metabolic: Goal: Ability to maintain appropriate glucose levels will improve Outcome: Progressing   Problem: Nutritional: Goal: Maintenance of adequate nutrition will improve Outcome: Progressing Goal: Progress toward achieving an optimal weight will improve Outcome: Progressing   Problem: Skin Integrity: Goal: Risk for impaired skin integrity will decrease Outcome: Progressing   Problem: Tissue Perfusion: Goal: Adequacy of tissue perfusion will improve Outcome: Progressing

## 2023-07-15 ENCOUNTER — Other Ambulatory Visit (HOSPITAL_COMMUNITY): Payer: Self-pay

## 2023-07-15 DIAGNOSIS — R45851 Suicidal ideations: Secondary | ICD-10-CM | POA: Diagnosis not present

## 2023-07-15 LAB — GLUCOSE, CAPILLARY: Glucose-Capillary: 126 mg/dL — ABNORMAL HIGH (ref 70–99)

## 2023-07-15 MED ORDER — RISPERIDONE 0.5 MG PO TABS
0.5000 mg | ORAL_TABLET | Freq: Two times a day (BID) | ORAL | 0 refills | Status: DC
  Filled 2023-07-15: qty 60, 30d supply, fill #0

## 2023-07-15 MED ORDER — RISPERIDONE 0.5 MG PO TABS: 0.5000 mg | ORAL_TABLET | Freq: Two times a day (BID) | ORAL | 1 refills | Status: DC

## 2023-07-15 MED ORDER — POTASSIUM CHLORIDE 20 MEQ PO PACK
40.0000 meq | PACK | Freq: Once | ORAL | Status: AC
  Administered 2023-07-15: 40 meq via ORAL
  Filled 2023-07-15: qty 2

## 2023-07-15 NOTE — Plan of Care (Signed)

## 2023-07-15 NOTE — Discharge Summary (Signed)
 Physician Discharge Summary  [name] XWR:[id number] DOB: 03/14/1961 DOA: 07/09/2023  PCP: Nestor Ramp, MD  Admit date: 07/09/2023  Discharge date: 07/15/2023  Admitted From: Home  Disposition:  Home  Recommendations for Outpatient Follow-up:  Follow up with PCP in 1-2 weeks. Please obtain BMP/CBC in one week. Advised to follow-up with psychiatry as scheduled. Advised to take Risperdal 0.5 mg twice daily. Advised to discontinue Seroquel as prescribed.  Home Health: None Equipment/Devices:None  Discharge Condition: Stable CODE STATUS:Full code Diet recommendation: Heart Healthy   Brief [facility] Course: This 63 yo Male with h/o NICM, DM, stage 3 CKD, HTN, afib, and bilateral chronic blindness who presented on 4/3 with depression, SI, visual hallucinations. Due to concern for chest pain, he was admitted to [facility] on suicide precautions. Dr. Sharyn Lull (cardiology) consulted and cleared him. He is medically stable for inpatient [facility] admission, on IVC since 4/5 . Seroquel was discontinued and patient started on Risperdal. Patient has been doing fine.  Denies any visual hallucination,  denies any suicidal ideations or plans. Psychiatry signed off.  IVC discontinued.  Patient feels better and wants to be discharged.  Patient being discharged home,  advised to follow-up with psychiatry as an outpatient.  Discharge Diagnoses:  Principal Problem:   Suicidal ideation Active Problems:   Obstructive sleep apnea   Essential Hypertension   Chronic kidney disease, stage 3a (HCC)   Type 2 diabetes mellitus with stage 3b chronic kidney disease, with long-term current use of insulin (HCC)   Blind in both eyes   Paroxysmal atrial fibrillation (HCC)   NICM (nonischemic cardiomyopathy) (HCC)   OBESITY, MORBID   Visual hallucinations   Chest pain  Suicidal ideation Patient continues to report suicidal ideations, none this AM Patient additionally reporting visual hallucinations although he  is essentially blind Psychiatry following, discontinued Seroquel and started Risperdal 0.5 mg twice daily Psychiatry recommending that this patient be placed in a geriatric psych behavioral health bed whether it be at a [place] facility or elsewhere - currently refused for [facility] given his visual impairment.  Patient has refused to go anywhere but a [facility] facility and therefore has had an IVC initiated since patient is not complying with care plan and is a threat to himself [facility] team consulted to help with placement. He is medically stable for inpatient [facility] placement. IVC discontinued, denies any suicidal ideations or plans. Psych signed off.   Chest pain: Atypical chest discomfort, resolved Slight elevation of troponin, flat No associated dynamic ST segment change based on review of EKG Patient has been evaluated by cardiology who feels that ACS/ischemia is unlikely and has signed off Workup for other etiologies of chest discomfort has been negative with normal D-dimer and chest x-ray   Visual hallucinations: Patient additionally reporting visual hallucinations although he is essentially blind. Psychiatry following, their assistance is appreciated Changed Seroquel to Risperdal 0.5 mg twice daily.   Type 2 diabetes mellitus with long-term current use of insulin  Resume home regimen of insulin Accu-Cheks before every meal and nightly with sliding scale insulin Continue farxiga Hemoglobin A1c 7.1% 04/22/2023, good control   Chronic kidney disease, stage 3a : Creatinine at/near baseline Minimizing nephrotoxic agents as much as possible   Essential Hypertension: Continue Coreg, Entresto   Paroxysmal atrial fibrillation  Rate controlled Patient currently in controlled normal sinus rhythm Continue home regimen of Eliquis and Coreg   NICM (nonischemic cardiomyopathy)  Euvolemic Continue [facility]  Discharge Instructions  Discharge Instructions     Call  MD for:  difficulty  breathing, headache or visual disturbances   Complete by: As directed    Call MD for:  persistant dizziness or light-headedness   Complete by: As directed    Call MD for:  persistant nausea and vomiting   Complete by: As directed    Diet - low sodium heart healthy   Complete by: As directed    Diet Carb Modified   Complete by: As directed    Discharge instructions   Complete by: As directed    Advised to follow-up with primary care physician in 1 week. Advised to follow-up with psychiatry as scheduled. Advised to take Risperdal 0.5 mg twice daily. Advised to discontinue Seroquel as prescribed.   Increase activity slowly   Complete by: As directed       Allergies as of 07/15/2023       Reactions   Bee Pollen Anaphylaxis, Itching, Swelling   Bee Venom Anaphylaxis   Shellfish Allergy Anaphylaxis   Nsaids Other (See Comments)   CKD and CHF        Medication List     STOP taking these medications    QUEtiapine 300 MG tablet Commonly known as: SEROquel       TAKE these medications    allopurinol 100 MG tablet Commonly known as: ZYLOPRIM Take 0.5 tablets (50 mg total) by mouth daily.   apixaban 5 MG Tabs tablet Commonly known as: Eliquis Take 1 tablet (5 mg total) by mouth 2 (two) times daily.   brimonidine 0.2 % ophthalmic solution Commonly known as: ALPHAGAN Place 1 drop into both eyes 2 (two) times daily.   carvedilol 25 MG tablet Commonly known as: COREG Take 1 tablet (25 mg total) by mouth 2 (two) times daily with a meal.   cetirizine 10 MG tablet Commonly known as: ZyrTEC Allergy Take 1 tablet (10 mg total) by mouth daily.   cyclobenzaprine 5 MG tablet Commonly known as: FLEXERIL Take 1 tablet (5 mg total) by mouth at bedtime as needed for muscle spasms.   dapagliflozin propanediol 10 MG Tabs tablet Commonly known as: Farxiga Take 1 tablet (10 mg total) by mouth daily.   diclofenac Sodium 1 % Gel Commonly known as: VOLTAREN Apply 2 g topically  4 (four) times daily as needed (right knee pain).   dorzolamide-timolol 2-0.5 % ophthalmic solution Commonly known as: COSOPT Place 1 drop into both eyes 2 (two) times daily.   EPINEPHrine 0.3 mg/0.3 mL Soaj injection Commonly known as: EPI-PEN Inject 0.3 mg into the muscle as needed for anaphylaxis.   fluticasone 110 MCG/ACT inhaler Commonly known as: FLOVENT HFA Inhale 1 puff into the lungs daily at 12 noon.   furosemide 40 MG tablet Commonly known as: LASIX Take 1 tablet (40 mg total) by mouth as directed. Strength: 40 mg What changed:  how much to take how to take this when to take this additional instructions   glimepiride 4 MG tablet Commonly known as: AMARYL Take 4 mg by mouth daily.   HYDROcodone-acetaminophen 5-325 MG tablet Commonly known as: NORCO/VICODIN TAKE ONE TABLET BY MOUTH FOUR TIMES DAILY. MAY take ONE additional for a total of FIVE PER DAY What changed:  how much to take how to take this when to take this reasons to take this   insulin glargine-yfgn 100 UNIT/ML Pen Commonly known as: SEMGLEE Inject 25 Units into the skin daily.   ipratropium 0.03 % nasal spray Commonly known as: ATROVENT Place 2 sprays into both nostrils every 12 (  twelve) hours. What changed:  when to take this reasons to take this   isosorbide mononitrate 20 MG tablet Commonly known as: ISMO Take 1 tablet (20 mg total) by mouth 2 (two) times daily.   latanoprost 0.005 % ophthalmic solution Commonly known as: XALATAN Place 1 drop into both eyes every evening.   mometasone-formoterol 200-5 MCG/ACT Aero Commonly known as: DULERA Inhale 2 puffs into the lungs daily.   naloxone 4 MG/0.1ML Liqd nasal spray kit Commonly known as: NARCAN place ONE SPRAY into THE nostril AS NEEDED (accidential overdose) What changed: See the new instructions.   neomycin-polymyxin b-dexamethasone 3.5-10000-0.1 Oint Commonly known as: MAXITROL Place 1 Application into both eyes at bedtime.    nitroGLYCERIN 0.4 MG SL tablet Commonly known as: NITROSTAT Place 0.4 mg under the tongue every 5 (five) minutes as needed for chest pain.   risperiDONE 0.5 MG tablet Commonly known as: RISPERDAL Take 1 tablet (0.5 mg total) by mouth 2 (two) times daily.   rosuvastatin 10 MG tablet Commonly known as: CRESTOR Take 1 tablet (10 mg total) by mouth daily.   sacubitril-valsartan 49-51 MG Commonly known as: ENTRESTO Take 1 tablet by mouth 2 (two) times daily.   Vitamin D (Ergocalciferol) 1.25 MG (50000 UNIT) Caps capsule Commonly known as: DRISDOL Take 50,000 Units by mouth once a week.        Follow-up Information     Nestor Ramp, MD Follow up in 1 week(s).   Specialties: Family Medicine, Sports Medicine Contact information: [address] [place] [address] [phone]         CHL-PSYCHIATRY Follow up in 1 week(s).                 Allergies  Allergen Reactions   Bee Pollen Anaphylaxis, Itching and Swelling   Bee Venom Anaphylaxis   Shellfish Allergy Anaphylaxis   Nsaids Other (See Comments)    CKD and CHF    Consultations: Psychiatry   Procedures/Studies: DG Chest 2 View Result Date: 07/10/2023 CLINICAL DATA:  Chest pain EXAM: CHEST - 2 VIEW COMPARISON:  06/24/2021 FINDINGS: Stable cardiomegaly. Left chest wall ICD. No focal consolidation, pleural effusion, or pneumothorax. No displaced rib fractures. IMPRESSION: No active cardiopulmonary disease. Electronically Signed   By: Minerva Fester M.D.   On: 07/10/2023 00:05   CT Head Wo Contrast Result Date: 06/23/2023 CLINICAL DATA:  63 year old male with altered mental status. Visual hallucinations. EXAM: CT HEAD WITHOUT CONTRAST TECHNIQUE: Contiguous axial images were obtained from the base of the skull through the vertex without intravenous contrast. RADIATION DOSE REDUCTION: This exam was performed according to the departmental dose-optimization program which includes automated exposure control,  adjustment of the mA and/or kV according to patient size and/or use of iterative reconstruction technique. COMPARISON:  None Available. FINDINGS: Brain: No midline shift, ventriculomegaly, mass effect, evidence of mass lesion, intracranial hemorrhage or evidence of cortically based acute infarction. Patchy and confluent bilateral periventricular white matter hypodensity, moderate. Otherwise maintained gray-white differentiation throughout the brain. Vascular: Calcified atherosclerosis at the skull base. No suspicious intracranial vascular hyperdensity. Skull: Intact.  No acute osseous abnormality identified. Sinuses/Orbits: Visualized paranasal sinuses and mastoids are clear. Other: Postoperative changes to the right globe. Otherwise negative visualized orbit soft tissues. Visualized scalp soft tissues are within normal limits. IMPRESSION: 1. No acute intracranial abnormality. 2. Moderate cerebral white matter changes, nonspecific but most commonly due to chronic small vessel disease. Electronically Signed   By: Odessa Fleming M.D.   On: 06/23/2023 08:24  Subjective: Patient was seen and examined at bedside.  Overnight events noted.   Patient reports doing much better, he wants to be discharged.  Denies any suicidal homicidal ideations.  Discharge Exam: Vitals:   07/15/23 0530 07/15/23 0831  BP: (!) 146/97   Pulse: 60   Resp: 14   Temp: 98.3 F (36.8 C)   SpO2: 98% 96%   Vitals:   07/14/23 1320 07/14/23 2054 07/15/23 0530 07/15/23 0831  BP: (!) 153/90 (!) 150/94 (!) 146/97   Pulse: 66 83 60   Resp: 18 14 14    Temp: 97.9 F (36.6 C) 98.7 F (37.1 C) 98.3 F (36.8 C)   TempSrc: Oral Oral Oral   SpO2: 95% 96% 98% 96%  Weight:      Height:        General: Pt is alert, awake, not in acute distress Cardiovascular: RRR, S1/S2 +, no rubs, no gallops Respiratory: CTA bilaterally, no wheezing, no rhonchi Abdominal: Soft, NT, ND, bowel sounds + Extremities: no edema, no cyanosis    The results  of significant diagnostics from this hospitalization (including imaging, microbiology, ancillary and laboratory) are listed below for reference.     Microbiology: No results found for this or any previous visit (from the past 240 hours).   Labs: BNP (last 3 results) No results for input(s): "BNP" in the last 8760 hours. Basic Metabolic Panel: Recent Labs  Lab 07/09/23 2325 07/10/23 0526 07/14/23 0556  NA 141 143 137  K 3.2* 3.2* 3.2*  CL 110 110 109  CO2 21* 23 21*  GLUCOSE 163* 143* 117*  BUN 20 19 19   CREATININE 1.66* 1.65* 1.40*  CALCIUM 8.8* 8.1* 8.4*   Liver Function Tests: Recent Labs  Lab 07/09/23 2325 07/10/23 0526  AST 17 14*  ALT 10 11  ALKPHOS 59 51  BILITOT 1.0 1.2  PROT 8.1 7.1  ALBUMIN 3.8 3.1*   No results for input(s): "LIPASE", "AMYLASE" in the last 168 hours. No results for input(s): "AMMONIA" in the last 168 hours. CBC: Recent Labs  Lab 07/09/23 2325 07/10/23 0526 07/14/23 0556  WBC 11.7* 9.5 8.4  NEUTROABS  --  6.0 5.2  HGB 17.4* 13.1 14.8  HCT 51.2 39.8 44.7  MCV 88.6 88.6 90.1  PLT 230 184 218   Cardiac Enzymes: No results for input(s): "CKTOTAL", "CKMB", "CKMBINDEX", "TROPONINI" in the last 168 hours. BNP: Invalid input(s): "POCBNP" CBG: Recent Labs  Lab 07/14/23 0708 07/14/23 1136 07/14/23 1538 07/14/23 2057 07/15/23 0738  GLUCAP 111* 145* 136* 149* 126*   D-Dimer No results for input(s): "DDIMER" in the last 72 hours. Hgb A1c No results for input(s): "HGBA1C" in the last 72 hours. Lipid Profile No results for input(s): "CHOL", "HDL", "LDLCALC", "TRIG", "CHOLHDL", "LDLDIRECT" in the last 72 hours. Thyroid function studies No results for input(s): "TSH", "T4TOTAL", "T3FREE", "THYROIDAB" in the last 72 hours.  Invalid input(s): "FREET3" Anemia work up No results for input(s): "VITAMINB12", "FOLATE", "FERRITIN", "TIBC", "IRON", "RETICCTPCT" in the last 72 hours. Urinalysis    Component Value Date/Time   COLORURINE  YELLOW 07/03/2010 1022   APPEARANCEUR CLEAR 07/03/2010 1022   LABSPEC 1.011 07/03/2010 1022   PHURINE 5.5 07/03/2010 1022   GLUCOSEU NEGATIVE 07/03/2010 1022   HGBUR NEGATIVE 07/03/2010 1022   BILIRUBINUR NEG 04/01/2012 1020   KETONESUR NEGATIVE 07/03/2010 1022   PROTEINUR NEG 04/01/2012 1020   PROTEINUR NEGATIVE 07/03/2010 1022   UROBILINOGEN 0.2 04/01/2012 1020   UROBILINOGEN 0.2 07/03/2010 1022   NITRITE NEG 04/01/2012 1020  NITRITE NEGATIVE 07/03/2010 1022   LEUKOCYTESUR Negative 04/01/2012 1020   Sepsis Labs Recent Labs  Lab 07/09/23 2325 07/10/23 0526 07/14/23 0556  WBC 11.7* 9.5 8.4   Microbiology No results found for this or any previous visit (from the past 240 hours).   Time coordinating discharge: Over 30 minutes  SIGNED:   Willeen Niece, MD  [facility] 07/15/2023, 2:56 PM Pager   If 7PM-7AM, please contact night-coverage

## 2023-07-15 NOTE — Discharge Instructions (Signed)
 Advised to follow-up with psychiatry as scheduled. Advised to take Risperdal 0.5 mg twice daily. Advised to discontinue Seroquel as prescribed.

## 2023-07-23 ENCOUNTER — Ambulatory Visit: Payer: Self-pay

## 2023-07-23 DIAGNOSIS — H5440 Blindness, one eye, unspecified eye: Secondary | ICD-10-CM | POA: Diagnosis not present

## 2023-07-23 NOTE — Patient Outreach (Signed)
 Complex Care Management   Visit Note  07/23/2023  Name:  [name] MRN: [id number] DOB: Jul 08, 1960  Situation: Referral received for Complex Care Management related to  Mental Health    I obtained verbal consent from Caregiver.  Visit completed with [name]/patient  on the phone  Background:   Past Medical History:  Diagnosis Date   Arthritis    Arthritis of knee, degenerative 10/03/2011   Right > LEFT KNEE oa X-rays were not standing but showed some mild sclerosis and joint space loss of the medial compartment of the right knee. History of arthroscopy right knee. Report of injury as a child but unclear exactly what that was.  :I am aware of 12/01/18 Rx by Dr. Jennette Kettle.  While wife was hospitalized, narcotic Rx went missing when in-home sitter left.  To my knowledge, this is the first mi   Automatic implantable cardioverter-defibrillator in situ 07/15/2013   Blind in both eyes 06/15/2013   Right Sees Dr Dione Booze   Cataracts, bilateral    right   Chronic gout due to renal impairment of multiple sites without tophus 04/12/2020   Chronic systolic heart failure (HCC) 02/18/2007   Qualifier: Diagnosis of  By: Bryson Ha MD, Gwendalyn Ege    Congestive heart failure (HCC)    a. s/p MDT single chamber ICD    Diabetes mellitus    does not check blood sugars at home   Diverticulosis    Encounter for chronic pain management 05/17/2014   Indication for chronic opioid: severe arthritis RIGHTknee / low back Medication and dose: hydrocodone---uses prn # pills per month: gets #120 pills to last TWO months Last UDS date: 05/17/2014 Pain contract signed (Y/N): Y 05/17/2014 Date narcotic database last reviewed (include red flags): 05/17/2014    Glaucoma    bilateral   Glaucoma 11/07/2015   Severe See Dr. Dione Booze   H/O TIA (transient ischemic attack) and stroke    Hyperlipidemia    Hyperlipidemia with target low density lipoprotein (LDL) cholesterol less than 100 mg/dL 07/14/8117   Hypertension    HYPERTENSION, BENIGN  02/18/2007   Qualifier: Diagnosis of  By: Jennette Kettle MD, Ronald Lobo of both feet 04/12/2020   NICM (nonischemic cardiomyopathy) (HCC) 05/30/2015   Obesity    OBESITY, MORBID 05/21/2010   Qualifier: Diagnosis of  By: Graciela Husbands, MD, Susie Cassette    Obstructive sleep apnea 03/27/2009   Qualifier: Diagnosis of  By: Sherral Hammers, RN, BSN, Melanie     Paroxysmal atrial fibrillation (HCC) 09/21/2019   Paroxysmal VT (HCC) 06/22/2019   RENAL DISEASE, CHRONIC, STAGE III 02/18/2007   SHOULDER PAIN, LEFT 08/01/2009   History of left shoulder surgery.     Single implantable cardioverter-defibrillator (ICD) in situ 06/17/2011   Type 2 diabetes mellitus with stage 4 chronic kidney disease, without long-term current use of insulin (HCC) 08/25/2007    Assessment:  The ex-wife of the patient, who is authorized to talk with me by his release of information in the chart, asserts that his physical health is showing improvement with the support of his medical team. However, she expresses a need for assistance regarding his mental stability.  I have made a refferal to LCSW to help find him a mental health doctor and ask Samanthaa to look at his menta health benefits to be prepared for the call to help the process go faster.     04/22/2023    8:40 AM  Depression screen PHQ 2/9  Decreased Interest 0  Down, Depressed,  Hopeless 0  PHQ - 2 Score 0  Altered sleeping 0  Tired, decreased energy 0  Change in appetite 0  Feeling bad or failure about yourself  0  Trouble concentrating 0  Moving slowly or fidgety/restless 0  Suicidal thoughts 0  PHQ-9 Score 0    There were no vitals filed for this visit.  Medications Reviewed Today     Reviewed by Augustin Leber, RN (Registered Nurse) on 07/23/23 at 1322  Med List Status: <None>   Medication Order Taking? Sig Documenting Provider Last Dose Status Informant  allopurinol (ZYLOPRIM) 100 MG tablet [id number] Yes Take 0.5 tablets (50 mg total) by mouth daily. Charise Companion, MD Taking Active Self, Pharmacy Records, Multiple Informants  apixaban (ELIQUIS) 5 MG TABS tablet [id number] Yes Take 1 tablet (5 mg total) by mouth 2 (two) times daily. Verona Goodwill, MD Taking Active Self, Pharmacy Records, Multiple Informants  brimonidine (ALPHAGAN) 0.2 % ophthalmic solution [id number] Yes Place 1 drop into both eyes 2 (two) times daily. [provider] Taking Active Multiple Informants, Self, Pharmacy Records  carvedilol (COREG) 25 MG tablet [id number] Yes Take 1 tablet (25 mg total) by mouth 2 (two) times daily with a meal. Charise Companion, MD Taking Active Multiple Informants, Self, Pharmacy Records  cetirizine (ZYRTEC ALLERGY) 10 MG tablet [id number] Yes Take 1 tablet (10 mg total) by mouth daily. Charise Companion, MD Taking Active Self, Pharmacy Records, Multiple Informants  cyclobenzaprine (FLEXERIL) 5 MG tablet [id number] Yes Take 1 tablet (5 mg total) by mouth at bedtime as needed for muscle spasms. Edison Gore, MD Taking Active Self, Pharmacy Records, Multiple Informants  dapagliflozin propanediol (FARXIGA) 10 MG TABS tablet [id number] Yes Take 1 tablet (10 mg total) by mouth daily. Verona Goodwill, MD Taking Active Self, Pharmacy Records, Multiple Informants  diclofenac Sodium (VOLTAREN) 1 % GEL [id number] Yes Apply 2 g topically 4 (four) times daily as needed (right knee pain). Charise Companion, MD Taking Active Self, Pharmacy Records, Multiple Informants  dorzolamide-timolol (COSOPT) 22.3-6.8 MG/ML ophthalmic solution [id number] Yes Place 1 drop into both eyes 2 (two) times daily. [provider] Taking Active Multiple Informants, Self, Pharmacy Records  EPINEPHrine 0.3 mg/0.3 mL IJ SOAJ injection [id number] Yes Inject 0.3 mg into the muscle as needed for anaphylaxis. Charise Companion, MD Taking Active Self, Pharmacy Records, Multiple Informants  fluticasone [facility]) 110 MCG/ACT inhaler [id number] Yes Inhale 1 puff into the lungs daily at 12 noon. [provider] Taking Active Self, Pharmacy Records, Multiple Informants  furosemide (LASIX) 40 MG tablet [id number] Yes Take 1 tablet (40 mg total) by mouth as directed. Strength: 40 mg  Patient taking differently: Take 80 mg by mouth in the morning, at noon, and at bedtime.   Charise Companion, MD Taking Active Self, Pharmacy Records, Multiple Informants  glimepiride (AMARYL) 4 MG tablet [id number] Yes Take 4 mg by mouth daily. [provider] Taking Active Self, Pharmacy Records, Multiple Informants  HYDROcodone-acetaminophen (NORCO/VICODIN) 5-325 MG tablet [id number] Yes TAKE ONE TABLET BY MOUTH FOUR TIMES DAILY. MAY take ONE additional for a total of FIVE PER DAY  Patient taking differently: Take 1 tablet by mouth every 6 (six) hours as needed for moderate pain (pain score 4-6) or severe pain (pain score 7-10). TAKE ONE TABLET BY MOUTH FOUR TIMES DAILY. MAY take ONE additional for a total of FIVE PER DAY   Charise Companion, MD Taking Active Self, Pharmacy Records, Multiple Informants  insulin glargine-yfgn [facility])  100 UNIT/ML Pen [id number] Yes Inject 25 Units into the skin daily. Charise Companion, MD Taking Active Self, Pharmacy Records, Multiple Informants  ipratropium (ATROVENT) 0.03 % nasal spray [id number] Yes Place 2 sprays into both nostrils every 12 (twelve) hours.  Patient taking differently: Place 2 sprays into both nostrils 2 (two) times daily as needed for rhinitis.   Edison Gore, MD Taking Active Self, Pharmacy Records, Multiple Informants  isosorbide mononitrate (ISMO) 20 MG tablet [id number] Yes Take 1 tablet (20 mg total) by mouth 2 (two) times daily. Charise Companion, MD Taking Active Self, Pharmacy Records, Multiple Informants  latanoprost (XALATAN) 0.005 % ophthalmic solution [id number] Yes Place 1 drop into both eyes every evening. [provider] Taking Active Multiple Informants, Self, Pharmacy Records    Discontinued 06/17/11 1000 (Side effect (s))   mometasone-formoterol (DULERA) 200-5  MCG/ACT AERO [id number] Yes Inhale 2 puffs into the lungs daily. Charise Companion, MD Taking Active Self, Pharmacy Records, Multiple Informants  naloxone [facility]) nasal spray 4 mg/0.1 mL [id number] Yes place ONE SPRAY into THE nostril AS NEEDED (accidential overdose)  Patient taking differently: Place 1 spray into the nose once as needed (Accidental overdose).   Charise Companion, MD Taking Active Self, Pharmacy Records, Multiple Informants  neomycin-polymyxin b-dexamethasone (MAXITROL) 3.5-10000-0.1 OINT [id number] Yes Place 1 Application into both eyes at bedtime. [provider] Taking Active Self, Pharmacy Records, Multiple Informants  nitroGLYCERIN (NITROSTAT) 0.4 MG SL tablet [id number] Yes Place 0.4 mg under the tongue every 5 (five) minutes as needed for chest pain. [provider] Taking Active Multiple Informants, Self, Pharmacy Records  risperiDONE (RISPERDAL) 0.5 MG tablet [id number] Yes Take 1 tablet (0.5 mg total) by mouth 2 (two) times daily. Magdalene School, MD Taking Active   rosuvastatin (CRESTOR) 10 MG tablet [id number] Yes Take 1 tablet (10 mg total) by mouth daily. Charise Companion, MD Taking Active Self, Pharmacy Records, Multiple Informants  sacubitril-valsartan [facility]) 49-51 MG [id number] Yes Take 1 tablet by mouth 2 (two) times daily. [provider] Taking Active Multiple Informants, Self, Pharmacy Records  Vitamin D, Ergocalciferol, (DRISDOL) 1.25 MG (50000 UNIT) CAPS capsule [id number] Yes Take 50,000 Units by mouth once a week. [provider] Taking Active Self, Pharmacy Records, Multiple Informants  Med List Note Alline Ivans, CPhT 07/10/23 1141): Son assists with medication             Recommendation:   PCP Follow-up  Follow Up Plan:   Telephone follow-up in 2 weeks  Augustin Leber RN, BSN, [facility]  [facility], [facility]  Care Coordinator Phone: [phone]

## 2023-07-23 NOTE — Patient Instructions (Signed)
 Visit Information  Thank you for taking time to visit with me today. Please don't hesitate to contact me if I can be of assistance to you before our next scheduled appointment.  Your next care management appointment is by telephone on 08/07/23 at 1 pm  Telephone follow-up in 2 weeks  Please call the care guide team at [phone] if you need to cancel, schedule, or reschedule an appointment.   Please call [phone]-TALK (toll free, 24 hour hotline) if you are experiencing a Mental Health or Behavioral Health Crisis or need someone to talk to.  Augustin Leber RN, BSN, [facility]  [facility], [facility]  Care Coordinator Phone: [phone]

## 2023-07-27 ENCOUNTER — Ambulatory Visit: Attending: Internal Medicine

## 2023-07-27 DIAGNOSIS — Z95810 Presence of automatic (implantable) cardiac defibrillator: Secondary | ICD-10-CM | POA: Diagnosis not present

## 2023-07-27 DIAGNOSIS — I5022 Chronic systolic (congestive) heart failure: Secondary | ICD-10-CM

## 2023-07-29 ENCOUNTER — Ambulatory Visit: Admitting: Family Medicine

## 2023-07-29 ENCOUNTER — Other Ambulatory Visit: Payer: Self-pay | Admitting: Family Medicine

## 2023-07-29 VITALS — BP 128/89 | HR 74 | Wt 311.4 lb

## 2023-07-29 DIAGNOSIS — S39012A Strain of muscle, fascia and tendon of lower back, initial encounter: Secondary | ICD-10-CM

## 2023-07-29 DIAGNOSIS — M170 Bilateral primary osteoarthritis of knee: Secondary | ICD-10-CM

## 2023-07-29 DIAGNOSIS — M25569 Pain in unspecified knee: Secondary | ICD-10-CM | POA: Diagnosis not present

## 2023-07-29 DIAGNOSIS — H5316 Psychophysical visual disturbances: Secondary | ICD-10-CM | POA: Diagnosis not present

## 2023-07-29 DIAGNOSIS — G8929 Other chronic pain: Secondary | ICD-10-CM | POA: Diagnosis not present

## 2023-07-29 MED ORDER — RISPERIDONE 1 MG PO TABS: 1.0000 mg | ORAL_TABLET | Freq: Two times a day (BID) | ORAL | 1 refills | Status: DC

## 2023-07-29 MED ORDER — HYDROCODONE-ACETAMINOPHEN 5-325 MG PO TABS: ORAL_TABLET | ORAL | 0 refills | Status: DC

## 2023-07-29 NOTE — Patient Instructions (Signed)
 Let me see you in about 6 weeks. I have increased your risperidone . Great to see you!

## 2023-07-30 NOTE — Progress Notes (Signed)
 EPIC Encounter for ICM Monitoring  Patient Name: [name] is a 63 y.o. male Date: 07/30/2023 Primary Care Physican: Charise Companion, MD Primary Cardiologist: Glena Landau  Electrophysiologist: Rodolfo Clan Nephrologist: [facility] 04/22/2023 Office Weight: 322 lbs 07/30/2023 Weight: 311 lbs                                                           Transmission results reviewed.     Optivol thoracic impedance suggesting normal fluid levels with the exception with possible fluid accumulation from 4/9-4/20.   Prescribed:  Furosemide  20 mg take 2 tablet(s) (40 mg total) by mouth every Mon, Wed, and Friday (prescribed [facility] 06/11/2023)   Labs: 07/14/2023 Creatinine 1.40, BUN 19, Potassium 3.2, Sodium 137, GFR 56  07/10/2023 Creatinine 1.65, BUN 19, Potassium 3.2, Sodium 143, GFR 46  07/09/2023 Creatinine 1.66, BUN 20, Potassium 3.2, Sodium 141, GFR 46  06/23/2023 Creatinine 1.54, BUN 12, Potassium 4.0, Sodium 142, GFR 50 04/22/2023 Creatinine 1.72, BUN 19, Potassium 4.5, Sodium 145, GFR 44 A complete set of results can be found in Results Review.   Recommendations:  No changes.   Follow-up plan: ICM clinic phone appointment on 09/01/2023.   91 day device clinic remote transmission 08/28/2023.     EP/Cardiology Office Visits:  08/27/2023 with Mertha Abrahams, PA.     Copy of ICM check sent to Dr. Rodolfo Clan.    3 month ICM trend: 07/27/2023.    12-14 Month ICM trend:     Almyra Jain, RN 07/30/2023 10:44 AM

## 2023-07-30 NOTE — Progress Notes (Signed)
    CHIEF COMPLAINT / HPI: Follow-up recent hospitalization for increased hallucinations, agitation, aggressive behavior.  He tells me today he was not ever going to hurt himself or anyone else he was just harming himself because he saw people surrounding his house.  Before all bicycles.  To his son was there and his son did not see the people.  This made him additionally anxious.  Was seen at the emergency room and briefly admitted.  Medications changed.  Since coming home he has been [place] and has had a few episodes of seeing people that others did not.  Notably he has some questions about whether or not he was supposed to stop his Seroquel  when he started on the Risperdal  so he has been taking both.  He is here today with his wife although they are separated.   PERTINENT  PMH / PSH: I have reviewed the patient's medications, allergies, past medical and surgical history, smoking status and updated in the EMR as appropriate.   OBJECTIVE:  BP 128/89   Pulse 74   Wt (!) 311 lb 6.4 oz (141.3 kg)   SpO2 98%   BMI 56.96 kg/m  GENERAL: Well-developed no acute distress. HEENT: Blind. ABDOMEN: Soft, obese, positive bowel sounds, no rebound or guarding CV: Heart sounds slightly distant, regular rate and rhythm without murmur LUNGS: Clear to auscultation bilaterally PSYCH: He is alert and oriented x 4.  He seems calm without any sign of agitation.  No psychomotor retardation.  Denies any current hallucinations.  Speech is normal in content and fluency.  He asks and answers questions appropriately.  ASSESSMENT / PLAN: 30 minutes  total time spent with patient  Onnie Bilis syndrome I do think his hallucinations are related to his blindness.  Definitely do not need him to be taking both the Seroquel  and the Risperdal .  Will discontinue Seroquel  totally and I have discussed this with both him and his wife Ova Bloomer.  Will increase the Risperdal  to 1 mg twice daily.  He still having some  hallucinations in the concerned that if we do not increase his dose, and we actually discontinue the Seroquel  like planned, he might have recurrence.  The let me know if he has any additional problems with this.  I will see him back in about a month.  Long discussion with him and his wife about safety, medications, aggression etc.   Violetta Grice MD

## 2023-07-30 NOTE — Assessment & Plan Note (Signed)
 I do think his hallucinations are related to his blindness.  Definitely do not need him to be taking both the Seroquel  and the Risperdal .  Will discontinue Seroquel  totally and I have discussed this with both him and his wife Ova Bloomer.  Will increase the Risperdal  to 1 mg twice daily.  He still having some hallucinations in the concerned that if we do not increase his dose, and we actually discontinue the Seroquel  like planned, he might have recurrence.  The let me know if he has any additional problems with this.  I will see him back in about a month.  Long discussion with him and his wife about safety, medications, aggression etc.

## 2023-08-07 ENCOUNTER — Other Ambulatory Visit: Payer: Self-pay | Admitting: Licensed Clinical Social Worker

## 2023-08-10 ENCOUNTER — Encounter: Admitting: Licensed Clinical Social Worker

## 2023-08-10 ENCOUNTER — Ambulatory Visit: Admitting: Podiatry

## 2023-08-10 NOTE — Patient Instructions (Signed)
 Visit Information  Thank you for taking time to visit with me today. Please don't hesitate to contact me if I can be of assistance to you before our next scheduled appointment.  Our next appointment is by telephone on 5/23 at 12:30 PM Please call the care guide team at [phone] if you need to cancel or reschedule your appointment.   Following is a copy of your care plan:   Goals Addressed             This Visit's Progress    VBCI Social Work Care Plan   On track    Problems:   Disease Management support and education needs related to History of Suicidal Ideation and Visual Hallucinations  CSW Clinical Goal(s):   Over the next 60 days the Patient will attend all scheduled medical appointments as evidenced by patient report and care team review of appointment completion in electronic MEDICAL RECORD NUMBER  demonstrate a reduction in symptoms related to History of Suicidal Ideation Visual Hallucinations .  Interventions:  Mental Health:  Evaluation of current treatment plan related to History of Suicidal Ideation and Visual Hallucinations Active listening / Reflection utilized Financial risk analyst / information provided Discussed referral options to connect for ongoing therapy: Family will inquire if local agencies are accepting new pts Emotional Support Provided Mindfulness or Relaxation training provided Provided general psycho-education for mental health needs Reviewed mental health medications and discussed importance of compliance: Patient reports compliance with medications Solution-Focued Strategies employed: Suicidal Ideation/Homicidal Ideation assessed: Pt denies  Informed PCP that pt is needing a refill on Nitroglycerin    Patient Goals/Self-Care Activities:  Increase coping skills and healthy habits  Plan:   Telephone follow up appointment with care management team member scheduled for:  2-4 weeks        Please call the Suicide and Crisis Lifeline:  988 go to [facility] [address], [place] [phone]) call 911 if you are experiencing a Mental Health or Behavioral Health Crisis or need someone to talk to.  The patient verbalized understanding of instructions, educational materials, and care plan provided today and DECLINED offer to receive copy of patient instructions, educational materials, and care plan.   Arlis Bent [facility]  [facility], [facility] Clinical Social Worker Direct Dial: [phone]  Fax: [phone] Website: [email or web address] 5:21 PM

## 2023-08-10 NOTE — Patient Outreach (Signed)
 Complex Care Management   Visit Note  08/07/2023  Name:  [name] MRN: [id number] DOB: November 20, 1960  Situation: Referral received for Complex Care Management related to Menta/Behavioral Health diagnosis Hx of SI/Visual Hallucinations  I obtained verbal consent from Patient.  Visit completed with pt and Caregiver  on the phone  Background:   Past Medical History:  Diagnosis Date   Arthritis    Arthritis of knee, degenerative 10/03/2011   Right > LEFT KNEE oa X-rays were not standing but showed some mild sclerosis and joint space loss of the medial compartment of the right knee. History of arthroscopy right knee. Report of injury as a child but unclear exactly what that was.  :I am aware of 12/01/18 Rx by Dr. Andree Kayser.  While wife was hospitalized, narcotic Rx went missing when in-home sitter left.  To my knowledge, this is the first mi   Automatic implantable cardioverter-defibrillator in situ 07/15/2013   Blind in both eyes 06/15/2013   Right Sees Dr Candi Chafe   Cataracts, bilateral    right   Chronic gout due to renal impairment of multiple sites without tophus 04/12/2020   Chronic systolic heart failure (HCC) 02/18/2007   Qualifier: Diagnosis of  By: Rosanne Commodore MD, Garald Jumbo    Congestive heart failure (HCC)    a. s/p MDT single chamber ICD    Diabetes mellitus    does not check blood sugars at home   Diverticulosis    Encounter for chronic pain management 05/17/2014   Indication for chronic opioid: severe arthritis RIGHTknee / low back Medication and dose: hydrocodone ---uses prn # pills per month: gets #120 pills to last TWO months Last UDS date: 05/17/2014 Pain contract signed (Y/N): Y 05/17/2014 Date narcotic database last reviewed (include red flags): 05/17/2014    Glaucoma    bilateral   Glaucoma 11/07/2015   Severe See Dr. Candi Chafe   H/O TIA (transient ischemic attack) and stroke    Hyperlipidemia    Hyperlipidemia with target low density lipoprotein (LDL) cholesterol less than 100 mg/dL  05/21/863   Hypertension    HYPERTENSION, BENIGN 02/18/2007   Qualifier: Diagnosis of  By: Andree Kayser MD, Romona Cobb of both feet 04/12/2020   NICM (nonischemic cardiomyopathy) (HCC) 05/30/2015   Obesity    OBESITY, MORBID 05/21/2010   Qualifier: Diagnosis of  By: Rodolfo Clan, MD, Lavella Poser    Obstructive sleep apnea 03/27/2009   Qualifier: Diagnosis of  By: Christia Cowboy, RN, BSN, Melanie     Paroxysmal atrial fibrillation (HCC) 09/21/2019   Paroxysmal VT (HCC) 06/22/2019   RENAL DISEASE, CHRONIC, STAGE III 02/18/2007   SHOULDER PAIN, LEFT 08/01/2009   History of left shoulder surgery.     Single implantable cardioverter-defibrillator (ICD) in situ 06/17/2011   Type 2 diabetes mellitus with stage 4 chronic kidney disease, without long-term current use of insulin  (HCC) 08/25/2007    Assessment: Patient Reported Symptoms:  Cognitive Cognitive Status: Alert and oriented to person, place, and time, Normal speech and language skills      Neurological Neurological Review of Symptoms: No symptoms reported    HEENT HEENT Symptoms Reported: No symptoms reported HEENT Conditions: Vision problem(s) Vision Problems: blindness/vision loss HEENT Management Strategies: Routine screening Vision problem(s)  Cardiovascular Cardiovascular Symptoms Reported: No symptoms reported Cardiovascular Conditions: Heart failure Cardiovascular Management Strategies: Routine screening  Respiratory Respiratory Symptoms Reported: No symptoms reported Respiratory Conditions:  (Sleep Apnea)  Endocrine Patient reports the following symptoms related to hypoglycemia or hyperglycemia : No symptoms  reported Is patient diabetic?: Yes Endocrine Conditions: Diabetes Endocrine Management Strategies: Routine screening, Medication therapy  Gastrointestinal Gastrointestinal Symptoms Reported: No symptoms reported      Genitourinary Genitourinary Symptoms Reported: No symptoms reported    Integumentary Integumentary  Symptoms Reported: No symptoms reported    Musculoskeletal Musculoskelatal Symptoms Reviewed: No symptoms reported        Psychosocial Psychosocial Symptoms Reported: No symptoms reported Additional Psychological Details: Patient denies symptoms of depression or anxiety since ED d/c. Denies AVH, SI/HI. He receives strong support system and is compliant with medications Behavioral Management Strategies: Coping strategies, Support system, Medication therapy Major Change/Loss/Stressor/Fears (CP): Medical condition, self Techniques to Cope with Loss/Stress/Change: Diversional activities, Medication Quality of Family Relationships: helpful, involved, supportive Do you feel physically threatened by others?: No      07/29/2023    9:28 AM  Depression screen PHQ 2/9  Decreased Interest 2  Down, Depressed, Hopeless 2  PHQ - 2 Score 4  Altered sleeping 3  Tired, decreased energy 3  Change in appetite 0  Feeling bad or failure about yourself  2  Trouble concentrating 0  Moving slowly or fidgety/restless 0  Suicidal thoughts 0  PHQ-9 Score 12    There were no vitals filed for this visit.  Medications Reviewed Today     Reviewed by Adriana Albany, LCSW (Social Worker) on 08/07/23 at 1317  Med List Status: <None>   Medication Order Taking? Sig Documenting Provider Last Dose Status Informant  allopurinol  (ZYLOPRIM ) 100 MG tablet [id number] Yes Take 0.5 tablets (50 mg total) by mouth daily. Charise Companion, MD Taking Active Self, Pharmacy Records, Multiple Informants  apixaban  (ELIQUIS ) 5 MG TABS tablet [id number] Yes Take 1 tablet (5 mg total) by mouth 2 (two) times daily. Verona Goodwill, MD Taking Active Self, Pharmacy Records, Multiple Informants  brimonidine  (ALPHAGAN ) 0.2 % ophthalmic solution [id number] Yes Place 1 drop into both eyes 2 (two) times daily. [provider] Taking Active Multiple Informants, Self, Pharmacy Records  carvedilol  (COREG ) 25 MG tablet [id number] Yes Take 1  tablet (25 mg total) by mouth 2 (two) times daily with a meal. Charise Companion, MD Taking Active Multiple Informants, Self, Pharmacy Records  cetirizine  (ZYRTEC  ALLERGY) 10 MG tablet [id number] Yes Take 1 tablet (10 mg total) by mouth daily. Charise Companion, MD Taking Active Self, Pharmacy Records, Multiple Informants  cyclobenzaprine  (FLEXERIL ) 5 MG tablet [id number] Yes Take 1 tablet (5 mg total) by mouth at bedtime as needed for muscle spasms. Edison Gore, MD Taking Active Self, Pharmacy Records, Multiple Informants  dapagliflozin  propanediol (FARXIGA ) 10 MG TABS tablet [id number] Yes Take 1 tablet (10 mg total) by mouth daily. Verona Goodwill, MD Taking Active Self, Pharmacy Records, Multiple Informants  diclofenac  Sodium (VOLTAREN ) 1 % GEL [id number] Yes Apply 2 g topically 4 (four) times daily as needed (right knee pain). Charise Companion, MD Taking Active Self, Pharmacy Records, Multiple Informants  dorzolamide -timolol  (COSOPT ) 22.3-6.8 MG/ML ophthalmic solution [id number] Yes Place 1 drop into both eyes 2 (two) times daily. [provider] Taking Active Multiple Informants, Self, Pharmacy Records  EPINEPHrine  0.3 mg/0.3 mL IJ SOAJ injection [id number] Yes Inject 0.3 mg into the muscle as needed for anaphylaxis. Charise Companion, MD Taking Active Self, Pharmacy Records, Multiple Informants  fluticasone  (FLOVENT  HFA) 110 MCG/ACT inhaler [id number] Yes Inhale 1 puff into the lungs daily at 12 noon. [provider] Taking Active Self, Pharmacy Records, Multiple Informants  furosemide  (LASIX ) 40 MG tablet [id number] Yes  Take 1 tablet (40 mg total) by mouth as directed. Strength: 40 mg  Patient taking differently: Take 80 mg by mouth in the morning, at noon, and at bedtime.   Charise Companion, MD Taking Active Self, Pharmacy Records, Multiple Informants  glimepiride  (AMARYL ) 4 MG tablet [id number] Yes Take 4 mg by mouth daily. [provider] Taking Active Self, Pharmacy Records, Multiple Informants   HYDROcodone -acetaminophen  (NORCO/VICODIN) 5-325 MG tablet [id number] Yes TAKE ONE TABLET BY MOUTH FOUR TIMES DAILY. MAY take ONE additional for a total of FIVE PER DAY Charise Companion, MD Taking Active   insulin  glargine-yfgn (SEMGLEE ) 100 UNIT/ML Pen [id number] Yes Inject 25 Units into the skin daily. Charise Companion, MD Taking Active Self, Pharmacy Records, Multiple Informants  ipratropium (ATROVENT ) 0.03 % nasal spray [id number] Yes Place 2 sprays into both nostrils every 12 (twelve) hours.  Patient taking differently: Place 2 sprays into both nostrils 2 (two) times daily as needed for rhinitis.   Edison Gore, MD Taking Active Self, Pharmacy Records, Multiple Informants  isosorbide  mononitrate (ISMO ) 20 MG tablet [id number] Yes Take 1 tablet (20 mg total) by mouth 2 (two) times daily. Charise Companion, MD Taking Active Self, Pharmacy Records, Multiple Informants  latanoprost  (XALATAN ) 0.005 % ophthalmic solution [id number] Yes Place 1 drop into both eyes every evening. [provider] Taking Active Multiple Informants, Self, Pharmacy Records    Discontinued 06/17/11 1000 (Side effect (s))   mometasone -formoterol  (DULERA ) 200-5 MCG/ACT AERO [id number] Yes Inhale 2 puffs into the lungs daily. Charise Companion, MD Taking Active Self, Pharmacy Records, Multiple Informants  naloxone  (NARCAN ) nasal spray 4 mg/0.1 mL [id number] Yes place ONE SPRAY into THE nostril AS NEEDED (accidential overdose)  Patient taking differently: Place 1 spray into the nose once as needed (Accidental overdose).   Charise Companion, MD Taking Active Self, Pharmacy Records, Multiple Informants  neomycin -polymyxin b-dexamethasone  (MAXITROL ) 3.5-10000-0.1 OINT [id number] Yes Place 1 Application into both eyes at bedtime. [provider] Taking Active Self, Pharmacy Records, Multiple Informants  nitroGLYCERIN  (NITROSTAT ) 0.4 MG SL tablet [id number] Yes Place 0.4 mg under the tongue every 5 (five) minutes as needed for chest pain. [provider] Taking Active Multiple Informants, Self, Pharmacy Records  risperiDONE  (RISPERDAL ) 1 MG tablet [id number] Yes Take 1 tablet (1 mg total) by mouth 2 (two) times daily. Charise Companion, MD Taking Active   rosuvastatin  (CRESTOR ) 10 MG tablet [id number] Yes Take 1 tablet (10 mg total) by mouth daily. Charise Companion, MD Taking Active Self, Pharmacy Records, Multiple Informants  sacubitril -valsartan  (ENTRESTO ) 49-51 MG [id number] Yes Take 1 tablet by mouth 2 (two) times daily. [provider] Taking Active Multiple Informants, Self, Pharmacy Records  Vitamin D, Ergocalciferol, (DRISDOL) 1.25 MG (50000 UNIT) CAPS capsule [id number] Yes Take 50,000 Units by mouth once a week. [provider] Taking Active Self, Pharmacy Records, Multiple Informants  Med List Note Alline Ivans, CPhT 07/10/23 1141): Son assists with medication             Recommendation:   Continue utilizing strategies discussed to assist with symptom management  Follow Up Plan:   Telephone follow-up 2-4 weeks  Alease Hunter, LCSW [facility]  [facility], [facility] Clinical Social Worker Direct Dial: [phone]  Fax: [phone] Website: [email or web address] 5:21 PM

## 2023-08-12 ENCOUNTER — Telehealth: Payer: Self-pay

## 2023-08-12 NOTE — Telephone Encounter (Signed)
 Received call from [facility] regarding Risperidone  prescription.   She reports issues at pharmacy with filling prescription.  Called and spoke with pharmacist. Reports that they filled Risperidone  1 mg on 08/01/2023. Spoke with [name] and cleared up confusion regarding the dosage of Risperidone . She voices understanding.   She reports that since Seroquel  was discontinued, patient has not been able to sleep. She reports that he has not slept in two days.   Ova Bloomer reports that she was told to call back in for a different prescription to help with sleep.   Will forward to PCP for further advisement.   Elsie Halo, RN

## 2023-08-13 ENCOUNTER — Other Ambulatory Visit: Payer: Self-pay | Admitting: Family Medicine

## 2023-08-13 DIAGNOSIS — S39012A Strain of muscle, fascia and tendon of lower back, initial encounter: Secondary | ICD-10-CM

## 2023-08-14 ENCOUNTER — Encounter: Payer: Self-pay | Admitting: Family Medicine

## 2023-08-14 ENCOUNTER — Other Ambulatory Visit: Payer: Self-pay | Admitting: Family Medicine

## 2023-08-14 ENCOUNTER — Telehealth: Payer: Self-pay | Admitting: Family Medicine

## 2023-08-14 MED ORDER — QUETIAPINE FUMARATE 50 MG PO TABS: 50.0000 mg | ORAL_TABLET | Freq: Every day | ORAL | 1 refills | Status: DC

## 2023-08-14 NOTE — Telephone Encounter (Signed)
 I will call her I have spken with Augustin Leber and we are working on a plan

## 2023-08-14 NOTE — Progress Notes (Signed)
 This encounter was created in error - please disregard.

## 2023-08-14 NOTE — Telephone Encounter (Signed)
**  After Hours/ Emergency Line Call**  Received a page to call [phone] - [phone].  Patient: [name]  Caller: Wife of patient. Confirmed name & DOB of patient with caller  Subjective:  Wife the patient called after-hours line's with concerns for patient being unable to sleep.  This is in the setting of Onnie Bilis syndrome, and recent increase in dosing of Risperdal .  She would like options to improve patient's sleep.  Recently admitted to [facility] long for suicidal ideation.  Objective:  Observations: N/A, patient not observed  Assessment & Plan  [name] is a 63 y.o. male with PMHx s/f Charles syndrome who calls with the following complaints and concerns:   Sleep disturbance   Recommendations:  Offered to schedule appointment today in access to care.  Patient's wife declined.  She is unable to transport patient.  Stated she would reach out to the clinic to talk with Dr. Andree Kayser (PCP).  Discussed that I am unable to provide prescription sleep medications without clinic visit.  Patient's wife verbalized understanding and plans to call clinic at opening time.  -- Red flags discussed.   -- Will forward to PCP.  Ivin Marrow, MD, PGY-2 [facility] 8:12 AM 08/14/2023

## 2023-08-14 NOTE — Progress Notes (Signed)
 Spoke with him and his wife via phone. He has been very restless and unable to sleep. His wife came over to help as she is no longer living there, and found him very frustrated about inability to sleep. Denies "seeing people" and wife and his son (who lives there) think he is not having hallucinations. Does not seem scared or anything.  I will add low dose Seroquel  for sleep at night. Continue the Risperidone  for  the antipsychotic activity.  Discussed options including Home Based Primary Care Program w Dr Myna Asal. [name] (and his wife) both  in favor of this as it will take a lot of stress off the family and [name] will have in home services.  Our Population Health Care Manager, Augustin Leber RN, is working to help set this up. [name] should hear from Dr. Markel Silber office in a week or so.

## 2023-08-14 NOTE — Telephone Encounter (Signed)
 Ova Bloomer returns call to nurse line.   She is very upset as she states that Mr. Ave has not slept since Tuesday.   She states that if she does not hear from Dr. Andree Kayser by early this afternoon, she is "going to drop him off in our lobby and leave him for her to deal with."  Advised patient that Dr. Andree Kayser is not currently working in the office and that I will speak with her when she is in the office this afternoon. I also advised that I would send her a follow up message regarding this concern, however, she is seeing patients this morning.   Elsie Halo, RN

## 2023-08-14 NOTE — Patient Outreach (Signed)
 Complex Care Management   Visit Note  08/14/2023  Name:  RAOUL SOMERO MRN: [id number] DOB: Aug 28, 1960  Situation: Referral received for Complex Care Management related to Inability to sleep I obtained verbal consent from Caregiver.  Visit completed with Caregiver  on the phone  Background:   Past Medical History:  Diagnosis Date   Arthritis    Arthritis of knee, degenerative 10/03/2011   Right > LEFT KNEE oa X-rays were not standing but showed some mild sclerosis and joint space loss of the medial compartment of the right knee. History of arthroscopy right knee. Report of injury as a child but unclear exactly what that was.  :I am aware of 12/01/18 Rx by Dr. Andree Kayser.  While wife was hospitalized, narcotic Rx went missing when in-home sitter left.  To my knowledge, this is the first mi   Automatic implantable cardioverter-defibrillator in situ 07/15/2013   Blind in both eyes 06/15/2013   Right Sees Dr Candi Chafe   Cataracts, bilateral    right   Chronic gout due to renal impairment of multiple sites without tophus 04/12/2020   Chronic systolic heart failure (HCC) 02/18/2007   Qualifier: Diagnosis of  By: Rosanne Commodore MD, Garald Jumbo    Congestive heart failure (HCC)    a. s/p MDT single chamber ICD    Diabetes mellitus    does not check blood sugars at home   Diverticulosis    Encounter for chronic pain management 05/17/2014   Indication for chronic opioid: severe arthritis RIGHTknee / low back Medication and dose: hydrocodone ---uses prn # pills per month: gets #120 pills to last TWO months Last UDS date: 05/17/2014 Pain contract signed (Y/N): Y 05/17/2014 Date narcotic database last reviewed (include red flags): 05/17/2014    Glaucoma    bilateral   Glaucoma 11/07/2015   Severe See Dr. Candi Chafe   H/O TIA (transient ischemic attack) and stroke    Hyperlipidemia    Hyperlipidemia with target low density lipoprotein (LDL) cholesterol less than 100 mg/dL 07/14/8117   Hypertension    HYPERTENSION, BENIGN  02/18/2007   Qualifier: Diagnosis of  By: Andree Kayser MD, Romona Cobb of both feet 04/12/2020   NICM (nonischemic cardiomyopathy) (HCC) 05/30/2015   Obesity    OBESITY, MORBID 05/21/2010   Qualifier: Diagnosis of  By: Rodolfo Clan, MD, Lavella Poser    Obstructive sleep apnea 03/27/2009   Qualifier: Diagnosis of  By: Christia Cowboy, RN, BSN, Melanie     Paroxysmal atrial fibrillation (HCC) 09/21/2019   Paroxysmal VT (HCC) 06/22/2019   RENAL DISEASE, CHRONIC, STAGE III 02/18/2007   SHOULDER PAIN, LEFT 08/01/2009   History of left shoulder surgery.     Single implantable cardioverter-defibrillator (ICD) in situ 06/17/2011   Type 2 diabetes mellitus with stage 4 chronic kidney disease, without long-term current use of insulin  (HCC) 08/25/2007    Assessment:  I had a conversation with Mrs. Shuping, who articulated her frustration concerning her husband's circumstances. She indicated that they have been separated since 2015; nevertheless, she continues to manage all of his affairs. Although Mrs. Friedland resides in [place], she has been in [place] since Tuesday to address her husband's prolonged lack of sleep, which has also impacted her own rest. She expressed a need for assistance and requested that I communicate her concerns to Dr. Andree Kayser.  I took the initiative to discuss this matter with Dr. Andree Kayser and consulted with one of my supervisors to explore potential solutions for her husband's situation. I contacted [facility]  regarding their primary home care program and spoke with Lemond Quail, who confirmed that Mr. Bartholomew is a candidate for the program. Dr. Andree Kayser has also spoken with Ova Bloomer about this program, and they are open to it. I have sent the faxed referral to [facility], which is currently experiencing a backlog of 1 to 2 weeks. I will follow up with them by telephone to ensure progress.      07/29/2023    9:28 AM  Depression screen PHQ 2/9  Decreased Interest 2  Down,  Depressed, Hopeless 2  PHQ - 2 Score 4  Altered sleeping 3  Tired, decreased energy 3  Change in appetite 0  Feeling bad or failure about yourself  2  Trouble concentrating 0  Moving slowly or fidgety/restless 0  Suicidal thoughts 0  PHQ-9 Score 12    There were no vitals filed for this visit.  Medications Reviewed Today   Medications were not reviewed in this encounter     Recommendation:   Home based Primary Care  Follow Up Plan:   RNCM will follow up in two weeks to check on the progress  Augustin Leber RN, BSN, [facility]  [facility], [facility]  Care Coordinator Phone: [phone]

## 2023-08-17 ENCOUNTER — Telehealth: Payer: Self-pay

## 2023-08-17 NOTE — Telephone Encounter (Signed)
 Returned call to spouse per DPR.  Advised last remote transmission with normal with the exception of few days of possible fluid.  She reports patient is doing well.  Advised next ICM remote transmission will be 5/27 and will call with results.  She appreciated the call back.

## 2023-08-19 ENCOUNTER — Telehealth: Payer: Self-pay

## 2023-08-19 NOTE — Telephone Encounter (Signed)
 [name] calls nurse line regarding questions with home based care doctor.   She is asking for the name, location and phone number of new doctor.   Advised that I would need to reach out to [facility] for this information.   [name] is asking if they should still keep appointment for beginning of June. Recommended that they keep this appointment due to uncertainty of time frame with establishing with home based care.   They will call and cancel appointment if they are able to get established with home based care prior to June 4.   Elsie Halo, RN

## 2023-08-24 ENCOUNTER — Other Ambulatory Visit: Payer: Self-pay

## 2023-08-24 ENCOUNTER — Other Ambulatory Visit (HOSPITAL_COMMUNITY): Payer: Self-pay

## 2023-08-24 ENCOUNTER — Telehealth: Payer: Self-pay

## 2023-08-24 MED ORDER — LANTUS SOLOSTAR 100 UNIT/ML ~~LOC~~ SOPN: 25.0000 [IU] | PEN_INJECTOR | Freq: Every day | SUBCUTANEOUS | 3 refills | Status: DC

## 2023-08-24 MED ORDER — INSULIN GLARGINE-YFGN 100 UNIT/ML ~~LOC~~ SOPN: 25.0000 [IU] | PEN_INJECTOR | Freq: Every day | SUBCUTANEOUS | 0 refills | Status: DC

## 2023-08-24 NOTE — Telephone Encounter (Signed)
 [facility] calls nurse line in regards to Semglee .   She reports the patient has requested a refill, however his insurance no longer covers this.   She reports insurance alternatives are Lantus , [place] or Toujeo .  Advised will forward to PCP.

## 2023-08-24 NOTE — Telephone Encounter (Signed)
 Lantus  Solostar, 1 box, 60 day supply - $0  Tresiba, 1 box, 60 day supply - $0Toujeo , 1 box, 54 day supply - $0

## 2023-08-24 NOTE — Telephone Encounter (Signed)
 [facility] calls nurse line in regards to Semglee .   They report they received this prescription, however his insurance does not cover this anymore.   Please see alternatives:  Lantus    Touejo  Horace Lye  Will forward to PCP.

## 2023-08-25 NOTE — Progress Notes (Signed)
 Cardiology Office Note Date:  08/25/2023  Patient ID:  [name], [name] [birth year], MRN [id number] PCP:  Charise Companion, MD  Cardiologist:  Dr. Glena Landau Electrophysiologist: Dr. Rodolfo Clan    Chief Complaint:     annual visit  History of Present Illness: [name] is a 63 y.o. male with history of HTN, HLD, DM, TIA, NICM, chronic CHF (systolic), ICD, blindness 2/2 glaucoma, CKD (III), AFib  I saw him Oct 2020 He feels well.  Denies any CP, palpitations or cardiac awareness.  No SOB, does not exercise, but denies difficulties with ADLs.  No dizzy spells, no near syncope or syncope, no shocks He saw Dr. Glena Landau recently, states he does his labs as well as his PMD. He tells me he can see shadows, no detail Device function was intact, no changes were made.  Hospitalization June 2021, progressive SOB, admitted for acute hypoxic respiratory failure initially thought secondary to acute on chronic systolic heart failure complicated by paroxysmal atrial fibrillation, more likely bronchitis after further evaluation.  Also noted to have AKI. Discharged  09/17/19   I saw him 05/28/20 He c/w Dr. Glena Landau, had an echo done a couple weeks ago, reported to him that his heart remains "stiff" had a leaky valve of no concern. L. Short RN recently had him take his lasix  daily for 4 days and resume PRN afterwards for elevated OptiVol. The patient denies SOB but does have a intermittent productive cough, no fever symptoms of illness. Denies noctural or positional SOB, no DOE> No CP, palpitations or cardiac awareness, no dizzy spells, near syncope or syncope. He is blind, so uncertain on stool/urine, denies bleeding gums with brushing/nose bleeds etc. OptiVol still abnormal and lasix  pulsed again and planned f/u with Dr. Glena Landau, EP in a year  He saw Dr. Rodolfo Clan 05/20/21 Doing well, continued meds, euvolemic, lasix  being used PRN, no changes were made.   I saw him May 2023 He feels well, says his  heart has not been troubling him. No CP, palpitations or SOB No cardiac awareness He continues to follow closely with Dr. Glena Landau, Q 3 mo, see his PMD next month and Dr. Glena Landau in July No near syncope or syncope. No bleeding His sister helps him Felt to have some volume on board, advised to take his lasix  for a couple days and follow up with Dr. Glena Landau  I saw him May 2024 He c/w Dr. Glena Landau, sees him a couple times a year, last was about 2 mo ago, no changes were made. He does not think he is retaining fluid, when he is swollen/bloated he doubles up on his lasix  a couple days, he and Dr. Glena Landau have discussed this. His son lives with him and his Ex-wife sister help, they have a good system with his meds He denies concerns about possibility of medication errors (given his blindness) He denies any CP, no SOB Denies DOE, no nocturnal; SOB No near syncope or syncope. No bleeding or signs of bleeding No changes were made  Hospitalization 07/09/23 initially at [facility] w/hallucination and suicidal ideations and was transferred to [facility], ED because of vague chest discomfort off-and-on for last few days  Dr. Glena Landau saw him, mild elevation of HS Trop and trending down, suspect demand, not true MI Not felt to need new ischemic evaluation with a cardiac catheterization in the past which showed no significant coronary artery disease had nuclear stress test approximately 3 years ago which showed also no evidence of reversible ischemia  Discharge 07/15/23  Has f/u with PMD team  TODAY  He has terrible back pain, otherwise says he is doing OK No CP, palpitations or cardiac awareness Denies SOB, but minimally active 2/2 his back No near syncope or syncope No bleeding or signs of bleeding No shocks  Device History: MDT single chamber ICD implanted 2008 for NICM; gen change 2017 History of appropriate therapy: No History of AAD therapy: No  Past Medical History:  Diagnosis Date   Arthritis     Arthritis of knee, degenerative 10/03/2011   Right > LEFT KNEE oa X-rays were not standing but showed some mild sclerosis and joint space loss of the medial compartment of the right knee. History of arthroscopy right knee. Report of injury as a child but unclear exactly what that was.  :I am aware of 12/01/18 Rx by Dr. Andree Kayser.  While wife was hospitalized, narcotic Rx went missing when in-home sitter left.  To my knowledge, this is the first mi   Automatic implantable cardioverter-defibrillator in situ 07/15/2013   Blind in both eyes 06/15/2013   Right Sees Dr Candi Chafe   Cataracts, bilateral    right   Chronic gout due to renal impairment of multiple sites without tophus 04/12/2020   Chronic systolic heart failure (HCC) 02/18/2007   Qualifier: Diagnosis of  By: Rosanne Commodore MD, Garald Jumbo    Congestive heart failure (HCC)    a. s/p MDT single chamber ICD    Diabetes mellitus    does not check blood sugars at home   Diverticulosis    Encounter for chronic pain management 05/17/2014   Indication for chronic opioid: severe arthritis RIGHTknee / low back Medication and dose: hydrocodone ---uses prn # pills per month: gets #120 pills to last TWO months Last UDS date: 05/17/2014 Pain contract signed (Y/N): Y 05/17/2014 Date narcotic database last reviewed (include red flags): 05/17/2014    Glaucoma    bilateral   Glaucoma 11/07/2015   Severe See Dr. Candi Chafe   H/O TIA (transient ischemic attack) and stroke    Hyperlipidemia    Hyperlipidemia with target low density lipoprotein (LDL) cholesterol less than 100 mg/dL 1/61/0960   Hypertension    HYPERTENSION, BENIGN 02/18/2007   Qualifier: Diagnosis of  By: Andree Kayser MD, Romona Cobb of both feet 04/12/2020   NICM (nonischemic cardiomyopathy) (HCC) 05/30/2015   Obesity    OBESITY, MORBID 05/21/2010   Qualifier: Diagnosis of  By: Rodolfo Clan, MD, Lavella Poser    Obstructive sleep apnea 03/27/2009   Qualifier: Diagnosis of  By: Christia Cowboy, RN, BSN, Melanie      Paroxysmal atrial fibrillation (HCC) 09/21/2019   Paroxysmal VT (HCC) 06/22/2019   RENAL DISEASE, CHRONIC, STAGE III 02/18/2007   SHOULDER PAIN, LEFT 08/01/2009   History of left shoulder surgery.     Single implantable cardioverter-defibrillator (ICD) in situ 06/17/2011   Type 2 diabetes mellitus with stage 4 chronic kidney disease, without long-term current use of insulin  (HCC) 08/25/2007    Past Surgical History:  Procedure Laterality Date   CARDIAC CATHETERIZATION  10/24/2003   EF of 45-50%   CARDIAC DEFIBRILLATOR PLACEMENT  2008   CATARACT EXTRACTION  2012   right   EP IMPLANTABLE DEVICE N/A 05/30/2015   Procedure: ICD Generator Changeout;  Surgeon: Verona Goodwill, MD;  [facility] INVASIVE CV LAB;  Service: Cardiovascular;  Laterality: N/A;   KNEE SURGERY Right 2000   SHOULDER SURGERY  2012   left    Current Outpatient Medications  Medication Sig Dispense Refill   allopurinol  (ZYLOPRIM ) 100 MG tablet Take 0.5 tablets (50 mg total) by mouth daily. 45 tablet 3   apixaban  (ELIQUIS ) 5 MG TABS tablet Take 1 tablet (5 mg total) by mouth 2 (two) times daily. 180 tablet 1   brimonidine  (ALPHAGAN ) 0.2 % ophthalmic solution Place 1 drop into both eyes 2 (two) times daily.     carvedilol  (COREG ) 25 MG tablet Take 1 tablet (25 mg total) by mouth 2 (two) times daily with a meal.     cetirizine  (ZYRTEC  ALLERGY) 10 MG tablet Take 1 tablet (10 mg total) by mouth daily. 30 tablet 6   cyclobenzaprine  (FLEXERIL ) 5 MG tablet TAKE 1 TABLET BY MOUTH AT BEDTIME AS NEEDED FOR MUSCLE SPASMS 30 tablet 1   dapagliflozin  propanediol (FARXIGA ) 10 MG TABS tablet Take 1 tablet (10 mg total) by mouth daily. 90 tablet 3   diclofenac  Sodium (VOLTAREN ) 1 % GEL Apply 2 g topically 4 (four) times daily as needed (right knee pain). 150 g 2   dorzolamide -timolol  (COSOPT ) 22.3-6.8 MG/ML ophthalmic solution Place 1 drop into both eyes 2 (two) times daily.     EPINEPHrine  0.3 mg/0.3 mL IJ SOAJ injection Inject 0.3 mg into  the muscle as needed for anaphylaxis. 2 each 1   fluticasone  (FLOVENT  HFA) 110 MCG/ACT inhaler Inhale 1 puff into the lungs daily at 12 noon.     furosemide  (LASIX ) 40 MG tablet Take 1 tablet (40 mg total) by mouth as directed. Strength: 40 mg (Patient taking differently: Take 80 mg by mouth in the morning, at noon, and at bedtime.) 90 tablet 3   glimepiride  (AMARYL ) 4 MG tablet Take 4 mg by mouth daily.     HYDROcodone -acetaminophen  (NORCO/VICODIN) 5-325 MG tablet TAKE ONE TABLET BY MOUTH FOUR TIMES DAILY. MAY take ONE additional for a total of FIVE PER DAY 150 tablet 0   insulin  glargine (LANTUS  SOLOSTAR) 100 UNIT/ML Solostar Pen Inject 25 Units into the skin daily. 15 mL 3   ipratropium (ATROVENT ) 0.03 % nasal spray Place 2 sprays into both nostrils every 12 (twelve) hours. (Patient taking differently: Place 2 sprays into both nostrils 2 (two) times daily as needed for rhinitis.) 30 mL 12   isosorbide  mononitrate (ISMO ) 20 MG tablet Take 1 tablet (20 mg total) by mouth 2 (two) times daily. 180 tablet 3   latanoprost  (XALATAN ) 0.005 % ophthalmic solution Place 1 drop into both eyes every evening.     mometasone -formoterol  (DULERA ) 200-5 MCG/ACT AERO Inhale 2 puffs into the lungs daily. 1 each 12   naloxone  (NARCAN ) nasal spray 4 mg/0.1 mL place ONE SPRAY into THE nostril AS NEEDED (accidential overdose) (Patient taking differently: Place 1 spray into the nose once as needed (Accidental overdose).) 2 each 1   neomycin -polymyxin b-dexamethasone  (MAXITROL ) 3.5-10000-0.1 OINT Place 1 Application into both eyes at bedtime.     nitroGLYCERIN  (NITROSTAT ) 0.4 MG SL tablet Place 0.4 mg under the tongue every 5 (five) minutes as needed for chest pain.     QUEtiapine  (SEROQUEL ) 50 MG tablet Take 1 tablet (50 mg total) by mouth at bedtime. 30 tablet 1   risperiDONE  (RISPERDAL ) 1 MG tablet Take 1 tablet (1 mg total) by mouth 2 (two) times daily. 60 tablet 1   rosuvastatin  (CRESTOR ) 10 MG tablet Take 1 tablet (10  mg total) by mouth daily. 90 tablet 3   sacubitril -valsartan  (ENTRESTO ) 49-51 MG Take 1 tablet by mouth 2 (two) times daily.     Vitamin D,  Ergocalciferol, (DRISDOL) 1.25 MG (50000 UNIT) CAPS capsule Take 50,000 Units by mouth once a week.     No current facility-administered medications for this visit.    Allergies:   Bee pollen, Bee venom, Shellfish allergy, and Nsaids   Social History:  The patient  reports that he has never smoked. He has never used smokeless tobacco. He reports that he does not drink alcohol and does not use drugs.   Family History:  The patient's family history includes Colon cancer in his father.  ROS:  Please see the history of present illness.  All other systems are reviewed and otherwise negative.   PHYSICAL EXAM:  VS:  There were no vitals taken for this visit. BMI: There is no height or weight on file to calculate BMI. Well nourished, well developed, in no acute distress  HEENT: normocephalic, atraumatic  Neck: no JVD, carotid bruits or masses Cardiac:  RRR; 1-2/6 SM, no rubs, or gallops Lungs: CTA b/l, no wheezing, rhonchi or rales  Abd: soft, nontender, obese MS: no deformity or atrophy Ext:  trace if any edema to mid shin edema  Skin: warm and dry, no rash Neuro:  No gross deficits appreciated Psych: euthymic mood, full affect  ICD site is stable, no tethering or discomfort   EKG:  Not done today  ICD interrogation done today and reviewed by myself:  Battery and lead measurements are good 5 episodes labeled monitored VT, EGMs c/w ectopy, and one appears AFib, longest 1 minute 23 seconds VP 0.1% OptiVol looks good   05/01/15: TTE Study Conclusions - Left ventricle: The cavity size was normal. There was moderate   focal basal hypertrophy of the septum. Systolic function was   mildly to moderately reduced. The estimated ejection fraction was   in the range of 40% to 45%. Diffuse hypokinesis. Doppler   parameters are consistent with abnormal  left ventricular   relaxation (grade 1 diastolic dysfunction). - Aortic valve: Transvalvular velocity was within the normal range.   There was no stenosis. There was no regurgitation. - Mitral valve: Transvalvular velocity was within the normal range.   There was no evidence for stenosis. There was no regurgitation. - Right ventricle: The cavity size was moderately dilated. Wall   thickness was normal. Systolic function was normal. - Tricuspid valve: There was no regurgitation.    Recent Labs: 07/10/2023: ALT 11 07/14/2023: BUN 19; Creatinine, Ser 1.40; Hemoglobin 14.8; Platelets 218; Potassium 3.2; Sodium 137  10/29/2022: Chol/HDL Ratio 4.5; Cholesterol, Total 154; HDL 34; LDL Chol Calc (NIH) 98; Triglycerides 124   CrCl cannot be calculated (Patient's most recent lab result is older than the maximum 21 days allowed.).   Wt Readings from Last 3 Encounters:  07/29/23 (!) 311 lb 6.4 oz (141.3 kg)  07/10/23 (!) 315 lb 4.1 oz (143 kg)  06/23/23 (!) 315 lb 14.7 oz (143.3 kg)     Other studies reviewed: Additional studies/records reviewed today include: summarized above  ASSESSMENT AND PLAN:  1. ICD     Intact function     no programming changes made  2. NICM 3. Chronic CHF     OptiVol, looks good     no symptoms or clear evidence of volume OL     On BB, entresto , nitrate, aldactone , lasix  (and xtra as needed as needed)     C/w Dr. Glena Landau  He needs entresto  refills, provided a couple months for him, though advised Dr. Glena Landau would need to refill after that   4.  HTN     Looks good      C/with his PMD or Dr. Glena Landau  5. HLD     Monitored by Dr. Glena Landau     not addressed today  6. Paroxysmal Afib     CHA2DS2Vasc is 5, on eliquis , appropriately dosed      No burden by symptoms   7. Secondary hypercoagulable state  8.  Hypokalemia He denies any labs done since his discharge Will get lab today to f/u on his hypokalemia noted at his hospital stay    Disposition:  remotes as usual, in clinic with EP again in a year, sooner if needed, he has seen Dr. Glena Landau since discharge, follows with him regularly     Current medicines are reviewed at length with the patient today.  The patient did not have any concerns regarding medicines.  [place], PA-C 08/25/2023 12:14 PM     [facility] [address] [place] [address] [phone] (office)  [phone] (fax)

## 2023-08-26 ENCOUNTER — Other Ambulatory Visit: Payer: Self-pay

## 2023-08-27 ENCOUNTER — Ambulatory Visit: Attending: Physician Assistant | Admitting: Physician Assistant

## 2023-08-27 ENCOUNTER — Encounter: Payer: Self-pay | Admitting: Physician Assistant

## 2023-08-27 VITALS — BP 112/78 | HR 84 | Ht 62.0 in

## 2023-08-27 DIAGNOSIS — I480 Paroxysmal atrial fibrillation: Secondary | ICD-10-CM | POA: Diagnosis not present

## 2023-08-27 DIAGNOSIS — D6869 Other thrombophilia: Secondary | ICD-10-CM

## 2023-08-27 DIAGNOSIS — I428 Other cardiomyopathies: Secondary | ICD-10-CM

## 2023-08-27 DIAGNOSIS — E876 Hypokalemia: Secondary | ICD-10-CM | POA: Diagnosis not present

## 2023-08-27 DIAGNOSIS — I5022 Chronic systolic (congestive) heart failure: Secondary | ICD-10-CM | POA: Diagnosis not present

## 2023-08-27 DIAGNOSIS — Z79899 Other long term (current) drug therapy: Secondary | ICD-10-CM

## 2023-08-27 DIAGNOSIS — Z95810 Presence of automatic (implantable) cardiac defibrillator: Secondary | ICD-10-CM | POA: Diagnosis not present

## 2023-08-27 LAB — BASIC METABOLIC PANEL WITH GFR
BUN/Creatinine Ratio: 11 (ref 10–24)
BUN: 18 mg/dL (ref 8–27)
CO2: 21 mmol/L (ref 20–29)
Calcium: 9 mg/dL (ref 8.6–10.2)
Chloride: 109 mmol/L — ABNORMAL HIGH (ref 96–106)
Creatinine, Ser: 1.67 mg/dL — ABNORMAL HIGH (ref 0.76–1.27)
Glucose: 151 mg/dL — ABNORMAL HIGH (ref 70–99)
Potassium: 4.1 mmol/L (ref 3.5–5.2)
Sodium: 146 mmol/L — ABNORMAL HIGH (ref 134–144)
eGFR: 45 mL/min/{1.73_m2} — ABNORMAL LOW (ref >59)

## 2023-08-27 MED ORDER — SACUBITRIL-VALSARTAN 49-51 MG PO TABS: 1.0000 | ORAL_TABLET | Freq: Two times a day (BID) | ORAL | 2 refills | Status: AC

## 2023-08-27 MED ORDER — GLIMEPIRIDE 4 MG PO TABS: 4.0000 mg | ORAL_TABLET | Freq: Every day | ORAL | 0 refills | Status: DC

## 2023-08-27 NOTE — Patient Instructions (Addendum)
 Medication Instructions:   Your physician recommends that you continue on your current medications as directed. Please refer to the Current Medication list given to you today.  *If you need a refill on your cardiac medications before your next appointment, please call your pharmacy*  Lab Work:  PLEASE GO DOWN STAIRS  [facility]  FIRST FLOOR   ( GET OFF ELEVATORS WALK TOWARDS WAITING AREA LAB LOCATED BY PHARMACY):  BMET    If you have labs (blood work) drawn today and your tests are completely normal, you will receive your results only by: MyChart Message (if you have MyChart) OR A paper copy in the mail If you have any lab test that is abnormal or we need to change your treatment, we will call you to review the results.  Testing/Procedures: NONE ORDERED  TODAY    Follow-Up: At [facility], you and your health needs are our priority.  As part of our continuing mission to provide you with exceptional heart care, our providers are all part of one team.  This team includes your primary Cardiologist (physician) and Advanced Practice Providers or APPs (Physician Assistants and Nurse Practitioners) who all work together to provide you with the care you need, when you need it.  Your next appointment:   1 year(s)  Provider:   You may see Mertha Abrahams, PA-C  We recommend signing up for the patient portal called "MyChart".  Sign up information is provided on this After Visit Summary.  MyChart is used to connect with patients for Virtual Visits (Telemedicine).  Patients are able to view lab/test results, encounter notes, upcoming appointments, etc.  Non-urgent messages can be sent to your provider as well.   To learn more about what you can do with MyChart, go to [email or web address].   Other Instructions

## 2023-08-27 NOTE — Telephone Encounter (Signed)
 [name] returns call to nurse line regarding rx refill.   Please advise,   Elsie Halo, RN

## 2023-08-28 ENCOUNTER — Ambulatory Visit (INDEPENDENT_AMBULATORY_CARE_PROVIDER_SITE_OTHER): Payer: Medicare Other

## 2023-08-28 ENCOUNTER — Other Ambulatory Visit: Payer: Self-pay | Admitting: Licensed Clinical Social Worker

## 2023-08-28 ENCOUNTER — Ambulatory Visit: Payer: Self-pay | Admitting: Physician Assistant

## 2023-08-28 DIAGNOSIS — I428 Other cardiomyopathies: Secondary | ICD-10-CM

## 2023-08-28 LAB — CUP PACEART REMOTE DEVICE CHECK
Battery Remaining Longevity: 43 mo
Battery Voltage: 2.97 V
Brady Statistic RV Percent Paced: 0.01 %
Date Time Interrogation Session: 20250523102507
HighPow Impedance: 42 Ohm
HighPow Impedance: 51 Ohm
Implantable Lead Connection Status: 753985
Implantable Lead Implant Date: 20081114
Implantable Lead Location: 753860
Implantable Lead Model: 6947
Implantable Pulse Generator Implant Date: 20170222
Lead Channel Impedance Value: 323 Ohm
Lead Channel Impedance Value: 437 Ohm
Lead Channel Pacing Threshold Amplitude: 0.625 V
Lead Channel Pacing Threshold Pulse Width: 0.4 ms
Lead Channel Sensing Intrinsic Amplitude: 7.25 mV
Lead Channel Sensing Intrinsic Amplitude: 7.25 mV
Lead Channel Setting Pacing Amplitude: 2.5 V
Lead Channel Setting Pacing Pulse Width: 0.4 ms
Lead Channel Setting Sensing Sensitivity: 0.3 mV
Zone Setting Status: 755011

## 2023-08-28 NOTE — Patient Outreach (Signed)
 Complex Care Management   Visit Note  08/28/2023  Name:  [name] MRN: [id number] DOB: 06/09/1960  Situation: Referral received for Complex Care Management related to Mental/Behavioral Health diagnosis Hx of Visual hallucinations I obtained verbal consent from Caregiver.  Visit completed with Caregiver  on the phone  Background:   Past Medical History:  Diagnosis Date   Arthritis    Arthritis of knee, degenerative 10/03/2011   Right > LEFT KNEE oa X-rays were not standing but showed some mild sclerosis and joint space loss of the medial compartment of the right knee. History of arthroscopy right knee. Report of injury as a child but unclear exactly what that was.  :I am aware of 12/01/18 Rx by Dr. Andree Kayser.  While wife was hospitalized, narcotic Rx went missing when in-home sitter left.  To my knowledge, this is the first mi   Automatic implantable cardioverter-defibrillator in situ 07/15/2013   Blind in both eyes 06/15/2013   Right Sees Dr Candi Chafe   Cataracts, bilateral    right   Chronic gout due to renal impairment of multiple sites without tophus 04/12/2020   Chronic systolic heart failure (HCC) 02/18/2007   Qualifier: Diagnosis of  By: Rosanne Commodore MD, Garald Jumbo    Congestive heart failure (HCC)    a. s/p MDT single chamber ICD    Diabetes mellitus    does not check blood sugars at home   Diverticulosis    Encounter for chronic pain management 05/17/2014   Indication for chronic opioid: severe arthritis RIGHTknee / low back Medication and dose: hydrocodone ---uses prn # pills per month: gets #120 pills to last TWO months Last UDS date: 05/17/2014 Pain contract signed (Y/N): Y 05/17/2014 Date narcotic database last reviewed (include red flags): 05/17/2014    Glaucoma    bilateral   Glaucoma 11/07/2015   Severe See Dr. Candi Chafe   H/O TIA (transient ischemic attack) and stroke    Hyperlipidemia    Hyperlipidemia with target low density lipoprotein (LDL) cholesterol less than 100 mg/dL 7/82/9562    Hypertension    HYPERTENSION, BENIGN 02/18/2007   Qualifier: Diagnosis of  By: Andree Kayser MD, Romona Cobb of both feet 04/12/2020   NICM (nonischemic cardiomyopathy) (HCC) 05/30/2015   Obesity    OBESITY, MORBID 05/21/2010   Qualifier: Diagnosis of  By: Rodolfo Clan, MD, Lavella Poser    Obstructive sleep apnea 03/27/2009   Qualifier: Diagnosis of  By: Christia Cowboy, RN, BSN, Melanie     Paroxysmal atrial fibrillation (HCC) 09/21/2019   Paroxysmal VT (HCC) 06/22/2019   RENAL DISEASE, CHRONIC, STAGE III 02/18/2007   SHOULDER PAIN, LEFT 08/01/2009   History of left shoulder surgery.     Single implantable cardioverter-defibrillator (ICD) in situ 06/17/2011   Type 2 diabetes mellitus with stage 4 chronic kidney disease, without long-term current use of insulin  (HCC) 08/25/2007    Assessment: Patient Reported Symptoms:  Cognitive Cognitive Status: Alert and oriented to person, place, and time, Normal speech and language skills      Neurological Neurological Review of Symptoms: Not assessed    HEENT HEENT Symptoms Reported: No symptoms reported      Cardiovascular Cardiovascular Symptoms Reported: No symptoms reported    Respiratory Respiratory Symptoms Reported: No symptoms reported    Endocrine Patient reports the following symptoms related to hypoglycemia or hyperglycemia : No symptoms reported    Gastrointestinal Gastrointestinal Symptoms Reported: No symptoms reported      Genitourinary Genitourinary Symptoms Reported: No symptoms reported  Integumentary Integumentary Symptoms Reported: No symptoms reported    Musculoskeletal Musculoskelatal Symptoms Reviewed: No symptoms reported        Psychosocial Psychosocial Symptoms Reported: No symptoms reported Additional Psychological Details: Patient's spouse reports increase in sleep quality after medications were adjusted approx 2 weeks ago. She has been in and out of the hospital due to her own medical conditions increasing pt's  anxiety/stress. He continues to receive strong support from spouse and adult son. Behavioral Management Strategies: Adequate rest, Support system, Coping strategies, Medication therapy Major Change/Loss/Stressor/Fears (CP): Medical condition, family, Medical condition, self Techniques to Cope with Loss/Stress/Change: Medication Quality of Family Relationships: helpful, involved, supportive      07/29/2023    9:28 AM  Depression screen PHQ 2/9  Decreased Interest 2  Down, Depressed, Hopeless 2  PHQ - 2 Score 4  Altered sleeping 3  Tired, decreased energy 3  Change in appetite 0  Feeling bad or failure about yourself  2  Trouble concentrating 0  Moving slowly or fidgety/restless 0  Suicidal thoughts 0  PHQ-9 Score 12    There were no vitals filed for this visit.  Medications Reviewed Today     Reviewed by Adriana Albany, LCSW (Social Worker) on 08/28/23 at 1541  Med List Status: <None>   Medication Order Taking? Sig Documenting Provider Last Dose Status Informant  allopurinol  (ZYLOPRIM ) 100 MG tablet [id number] No Take 0.5 tablets (50 mg total) by mouth daily.  Patient not taking: Reported on 08/27/2023   Charise Companion, MD Not Taking Active Self, Pharmacy Records, Multiple Informants  apixaban  (ELIQUIS ) 5 MG TABS tablet [id number] No Take 1 tablet (5 mg total) by mouth 2 (two) times daily. Verona Goodwill, MD Taking Active Self, Pharmacy Records, Multiple Informants  brimonidine  (ALPHAGAN ) 0.2 % ophthalmic solution [id number] No Place 1 drop into both eyes 2 (two) times daily. [provider] Taking Active Multiple Informants, Self, Pharmacy Records  carvedilol  (COREG ) 25 MG tablet [id number] No Take 1 tablet (25 mg total) by mouth 2 (two) times daily with a meal. Charise Companion, MD Taking Active Multiple Informants, Self, Pharmacy Records  cetirizine  (ZYRTEC  ALLERGY) 10 MG tablet [id number] No Take 1 tablet (10 mg total) by mouth daily. Charise Companion, MD Taking Active Self, Pharmacy  Records, Multiple Informants  cyclobenzaprine  (FLEXERIL ) 5 MG tablet [id number] No TAKE 1 TABLET BY MOUTH AT BEDTIME AS NEEDED FOR MUSCLE SPASMS Charise Companion, MD Taking Active   dapagliflozin  propanediol (FARXIGA ) 10 MG TABS tablet [id number] No Take 1 tablet (10 mg total) by mouth daily. Verona Goodwill, MD Taking Active Self, Pharmacy Records, Multiple Informants  diclofenac  Sodium (VOLTAREN ) 1 % GEL [id number] No Apply 2 g topically 4 (four) times daily as needed (right knee pain). Charise Companion, MD Taking Active Self, Pharmacy Records, Multiple Informants  dorzolamide -timolol  (COSOPT ) 22.3-6.8 MG/ML ophthalmic solution [id number] No Place 1 drop into both eyes 2 (two) times daily. [provider] Taking Active Multiple Informants, Self, Pharmacy Records  EPINEPHrine  0.3 mg/0.3 mL IJ SOAJ injection [id number] No Inject 0.3 mg into the muscle as needed for anaphylaxis. Charise Companion, MD Taking Active Self, Pharmacy Records, Multiple Informants  fluticasone  (FLOVENT  HFA) 110 MCG/ACT inhaler [id number] No Inhale 1 puff into the lungs daily at 12 noon. [provider] Taking Active Self, Pharmacy Records, Multiple Informants  furosemide  (LASIX ) 40 MG tablet [id number] No Take 1 tablet (40 mg total) by mouth as directed. Strength: 40 mg  Patient taking differently: Take  80 mg by mouth in the morning, at noon, and at bedtime.   Charise Companion, MD Taking Active Self, Pharmacy Records, Multiple Informants  glimepiride  (AMARYL ) 4 MG tablet [id number]  Take 1 tablet (4 mg total) by mouth daily. Charise Companion, MD  Active   HYDROcodone -acetaminophen  (NORCO/VICODIN) 5-325 MG tablet [id number] No TAKE ONE TABLET BY MOUTH FOUR TIMES DAILY. MAY take ONE additional for a total of FIVE PER DAY Charise Companion, MD Taking Active   insulin  glargine (LANTUS  SOLOSTAR) 100 UNIT/ML Solostar Pen [id number] No Inject 25 Units into the skin daily. Charise Companion, MD Taking Active   ipratropium (ATROVENT ) 0.03 % nasal spray  [id number] No Place 2 sprays into both nostrils every 12 (twelve) hours.  Patient taking differently: Place 2 sprays into both nostrils 2 (two) times daily as needed for rhinitis.   Edison Gore, MD Taking Active Self, Pharmacy Records, Multiple Informants  isosorbide  mononitrate (ISMO ) 20 MG tablet [id number] No Take 1 tablet (20 mg total) by mouth 2 (two) times daily. Charise Companion, MD Taking Active Self, Pharmacy Records, Multiple Informants  latanoprost  (XALATAN ) 0.005 % ophthalmic solution [id number] No Place 1 drop into both eyes every evening. [provider] Taking Active Multiple Informants, Self, Pharmacy Records  mometasone -formoterol  (DULERA ) 200-5 MCG/ACT AERO [id number] No Inhale 2 puffs into the lungs daily. Charise Companion, MD Taking Active Self, Pharmacy Records, Multiple Informants  naloxone  (NARCAN ) nasal spray 4 mg/0.1 mL [id number] No place ONE SPRAY into THE nostril AS NEEDED (accidential overdose)  Patient taking differently: Place 1 spray into the nose once as needed (Accidental overdose).   Charise Companion, MD Taking Active Self, Pharmacy Records, Multiple Informants  neomycin -polymyxin b-dexamethasone  (MAXITROL ) 3.5-10000-0.1 OINT [id number] No Place 1 Application into both eyes at bedtime. [provider] Taking Active Self, Pharmacy Records, Multiple Informants  nitroGLYCERIN  (NITROSTAT ) 0.4 MG SL tablet [id number] No Place 0.4 mg under the tongue every 5 (five) minutes as needed for chest pain. [provider] Taking Active Multiple Informants, Self, Pharmacy Records  QUEtiapine  (SEROQUEL ) 50 MG tablet [id number] No Take 1 tablet (50 mg total) by mouth at bedtime. Charise Companion, MD Taking Active   risperiDONE  (RISPERDAL ) 1 MG tablet [id number] No Take 1 tablet (1 mg total) by mouth 2 (two) times daily. Charise Companion, MD Taking Active   rosuvastatin  (CRESTOR ) 10 MG tablet [id number] No Take 1 tablet (10 mg total) by mouth daily. Charise Companion, MD Taking Active Self,  Pharmacy Records, Multiple Informants  sacubitril -valsartan  (ENTRESTO ) 49-51 MG [id number]  Take 1 tablet by mouth 2 (two) times daily. Debbie Fails, PA-C  Active   Vitamin D, Ergocalciferol, (DRISDOL) 1.25 MG (50000 UNIT) CAPS capsule [id number] No Take 50,000 Units by mouth once a week. [provider] Taking Active Self, Pharmacy Records, Multiple Informants  Med List Note Alline Ivans, CPhT 07/10/23 1141): Son assists with medication             Recommendation:   PCP Follow-up  Follow Up Plan:   Telephone follow-up in 1 month  Alease Hunter, LCSW [facility]  [facility], [facility] Clinical Social Worker Direct Dial: [phone]  Fax: [phone] Website: [email or web address] 3:47 PM

## 2023-08-28 NOTE — Patient Instructions (Signed)
 Visit Information  Thank you for taking time to visit with me today. Please don't hesitate to contact me if I can be of assistance to you before our next scheduled appointment.  Your next care management appointment is by telephone on 06/13 at 3 PM  Please call the care guide team at [phone] if you need to cancel, schedule, or reschedule an appointment.   Please call the Suicide and Crisis Lifeline: [id number] go to [facility] Urgent [facility] [address] [place] [phone]) call 911 if you are experiencing a Mental Health or Behavioral Health Crisis or need someone to talk to.  Alease Hunter, LCSW [place]  [facility], [facility] Clinical Social Worker Direct Dial: [phone]  Fax: [phone] Website: [email or web address] 3:48 PM

## 2023-09-01 ENCOUNTER — Other Ambulatory Visit: Payer: Self-pay

## 2023-09-01 ENCOUNTER — Ambulatory Visit: Attending: Cardiovascular Disease

## 2023-09-01 DIAGNOSIS — Z95810 Presence of automatic (implantable) cardiac defibrillator: Secondary | ICD-10-CM | POA: Diagnosis not present

## 2023-09-01 DIAGNOSIS — I5022 Chronic systolic (congestive) heart failure: Secondary | ICD-10-CM

## 2023-09-01 DIAGNOSIS — G8929 Other chronic pain: Secondary | ICD-10-CM

## 2023-09-01 DIAGNOSIS — M170 Bilateral primary osteoarthritis of knee: Secondary | ICD-10-CM

## 2023-09-01 MED ORDER — HYDROCODONE-ACETAMINOPHEN 5-325 MG PO TABS: ORAL_TABLET | ORAL | 0 refills | Status: DC

## 2023-09-01 MED ORDER — GLIMEPIRIDE 4 MG PO TABS: 4.0000 mg | ORAL_TABLET | Freq: Every day | ORAL | 0 refills | Status: DC

## 2023-09-02 NOTE — Progress Notes (Signed)
 EPIC Encounter for ICM Monitoring  Patient Name: [name] is a 63 y.o. male Date: 09/02/2023 Primary Care Physican: Charise Companion, MD Primary Cardiologist: Glena Landau  Electrophysiologist: Mealor Nephrologist: [facility] 04/22/2023 Office Weight: 322 lbs 07/30/2023 Weight: 311 lbs                                                         Spoke with wife and heart failure questions reviewed.  Transmission results reviewed.  Pt asymptomatic for fluid accumulation.  Reports feeling well at this time and voices no complaints.    Optivol thoracic impedance suggesting possible fluid accumulation starting 5/23.   Prescribed:  Furosemide  40 mg take 2 tablet(s) (40 mg total) by mouth daily (per 08/27/23 OV note with Mertha Abrahams, PA, patient takes extra if needed).  Wife reports on 5/28 he takes Lasix  PRN   Labs: 07/14/2023 Creatinine 1.40, BUN 19, Potassium 3.2, Sodium 137, GFR 56  07/10/2023 Creatinine 1.65, BUN 19, Potassium 3.2, Sodium 143, GFR 46  07/09/2023 Creatinine 1.66, BUN 20, Potassium 3.2, Sodium 141, GFR 46  06/23/2023 Creatinine 1.54, BUN 12, Potassium 4.0, Sodium 142, GFR 50 04/22/2023 Creatinine 1.72, BUN 19, Potassium 4.5, Sodium 145, GFR 44 A complete set of results can be found in Results Review.   Recommendations:  Advised to take Lasix  1 tablet tomorrow and 1 tablet on Saturday (pt is taking PRN instead of daily)   Follow-up plan: ICM clinic phone appointment on 09/07/2023 to recheck fluid levels.   91 day device clinic remote transmission 11/27/2023.     EP/Cardiology Office Visits:  None   Copy of ICM check sent to Dr. Arlester Ladd.    3 month ICM trend: 09/01/2023.    12-14 Month ICM trend:     Almyra Jain, RN 09/02/2023 1:43 PM

## 2023-09-07 ENCOUNTER — Ambulatory Visit: Attending: Cardiovascular Disease

## 2023-09-07 DIAGNOSIS — I5022 Chronic systolic (congestive) heart failure: Secondary | ICD-10-CM

## 2023-09-07 DIAGNOSIS — Z95810 Presence of automatic (implantable) cardiac defibrillator: Secondary | ICD-10-CM

## 2023-09-07 NOTE — Progress Notes (Signed)
 EPIC Encounter for ICM Monitoring  Patient Name: [name] is a 63 y.o. male Date: 09/07/2023 Primary Care Physican: Charise Companion, MD Primary Cardiologist: Glena Landau  Electrophysiologist: Mealor Nephrologist: [facility] 04/22/2023 Office Weight: 322 lbs 07/30/2023 Weight: 311 lbs                                                          Spoke with wife, DRP  and heart failure questions reviewed.  Transmission results reviewed.  Pt asymptomatic for fluid accumulation.  Reports feeling well at this time and voices no complaints.     Optivol thoracic impedance suggesting fluid levels returned to normal on 5/28 after taking 2 doses of Lasix .   Prescribed:  Furosemide  40 mg take 2 tablet(s) (40 mg total) by mouth daily (per 08/27/23 OV note with Mertha Abrahams, PA, patient takes extra if needed).  Wife reports on 5/28 he takes Lasix  PRN   Labs: 07/14/2023 Creatinine 1.40, BUN 19, Potassium 3.2, Sodium 137, GFR 56  07/10/2023 Creatinine 1.65, BUN 19, Potassium 3.2, Sodium 143, GFR 46  07/09/2023 Creatinine 1.66, BUN 20, Potassium 3.2, Sodium 141, GFR 46  06/23/2023 Creatinine 1.54, BUN 12, Potassium 4.0, Sodium 142, GFR 50 04/22/2023 Creatinine 1.72, BUN 19, Potassium 4.5, Sodium 145, GFR 44 A complete set of results can be found in Results Review.   Recommendations:   No changes and encouraged to call if experiencing any fluid symptoms.   Follow-up plan: ICM clinic phone appointment on 10/26/2023.   91 day device clinic remote transmission 11/27/2023.     EP/Cardiology Office Visits:  None   Copy of ICM check sent to Dr. Arlester Ladd.    3 month ICM trend: 09/07/2023.    12-14 Month ICM trend:     Almyra Jain, RN 09/07/2023 1:13 PM

## 2023-09-09 ENCOUNTER — Ambulatory Visit: Admitting: Family Medicine

## 2023-09-09 ENCOUNTER — Ambulatory Visit: Payer: Self-pay | Admitting: Cardiovascular Disease

## 2023-09-09 ENCOUNTER — Encounter: Payer: Self-pay | Admitting: Family Medicine

## 2023-09-09 VITALS — BP 128/96 | HR 78 | Ht 62.0 in | Wt 306.8 lb

## 2023-09-09 DIAGNOSIS — I428 Other cardiomyopathies: Secondary | ICD-10-CM | POA: Diagnosis not present

## 2023-09-09 DIAGNOSIS — I10 Essential (primary) hypertension: Secondary | ICD-10-CM | POA: Diagnosis not present

## 2023-09-09 DIAGNOSIS — H5316 Psychophysical visual disturbances: Secondary | ICD-10-CM

## 2023-09-09 DIAGNOSIS — R45851 Suicidal ideations: Secondary | ICD-10-CM

## 2023-09-09 DIAGNOSIS — E1122 Type 2 diabetes mellitus with diabetic chronic kidney disease: Secondary | ICD-10-CM

## 2023-09-09 DIAGNOSIS — S39012A Strain of muscle, fascia and tendon of lower back, initial encounter: Secondary | ICD-10-CM

## 2023-09-09 DIAGNOSIS — N184 Chronic kidney disease, stage 4 (severe): Secondary | ICD-10-CM | POA: Diagnosis not present

## 2023-09-09 DIAGNOSIS — R441 Visual hallucinations: Secondary | ICD-10-CM

## 2023-09-09 LAB — POCT GLYCOSYLATED HEMOGLOBIN (HGB A1C): HbA1c, POC (controlled diabetic range): 7.1 % — AB (ref 0.0–7.0)

## 2023-09-09 MED ORDER — SEMAGLUTIDE 3 MG PO TABS: 3.0000 mg | ORAL_TABLET | Freq: Every day | ORAL | 0 refills | Status: DC

## 2023-09-09 MED ORDER — QUETIAPINE FUMARATE 50 MG PO TABS: 50.0000 mg | ORAL_TABLET | Freq: Every day | ORAL | 3 refills | Status: AC

## 2023-09-09 MED ORDER — ALLOPURINOL 100 MG PO TABS: 50.0000 mg | ORAL_TABLET | Freq: Every day | ORAL | 3 refills | Status: AC

## 2023-09-09 MED ORDER — CYCLOBENZAPRINE HCL 5 MG PO TABS: 5.0000 mg | ORAL_TABLET | Freq: Every day | ORAL | 1 refills | Status: DC

## 2023-09-09 NOTE — Patient Instructions (Signed)
 Let me see you in 3 months, please call the office if something arises before then.  I have sent in a new medicine as Dr Glena Landau suggested.  I have given you refills.

## 2023-09-10 NOTE — Assessment & Plan Note (Signed)
 Blood pressure is fairly well-controlled we will continue current regimen.  He is religious with his medications.

## 2023-09-10 NOTE — Assessment & Plan Note (Signed)
 Resolved currently on new medication regimen.  Follow-up 3 months.

## 2023-09-10 NOTE — Assessment & Plan Note (Signed)
 Seem to have found a workable regimen for him so we will continue this and follow him back in 3 months.

## 2023-09-10 NOTE — Assessment & Plan Note (Signed)
 He said no more suicidal ideation, no more hallucinations of the visual kind.

## 2023-09-10 NOTE — Assessment & Plan Note (Signed)
No symptoms currently.

## 2023-09-10 NOTE — Assessment & Plan Note (Signed)
 Unfortunately he is not able to get much exercise currently.  As though summer rolls around, he is going to have difficulty getting out because of the heat and during the winter it is probably related to the cold temperatures.  He does not have an external facility where he could exercise safely.

## 2023-09-10 NOTE — Progress Notes (Signed)
    CHIEF COMPLAINT / HPI:  \f/u DM No problems with medications.  Does need some refills.  No episodes of low blood sugar. 2.  Blindness: His son is still living with him and this is very helpful as he could not seek to get many of his IADLs done. 3.  Hallucinations: Secondary to [place] syndrome: He is doing much better right now.  He is taking both the risperidone  and the Seroquel .  No side effects noted.   PERTINENT  PMH / PSH: I have reviewed the patient's medications, allergies, past medical and surgical history, smoking status and updated in the EMR as appropriate.   OBJECTIVE:  BP (!) 128/96   Pulse 78   Ht 5\' 2"  (1.575 m)   Wt (!) 306 lb 12.8 oz (139.2 kg)   SpO2 100%   BMI 56.11 kg/m  GENERAL: Well-developed overweight male no acute distress. HEENT: Bilaterally blind.  Next out lymphadenopathy CV: Regular rate and rhythm ABDOMEN is obese, soft, positive bowel sounds, no ascites noted but this exam is limited secondary to habitus PSYCH: AxOx4. Good eye contact.. No psychomotor retardation or agitation. Appropriate speech fluency and content. Asks and answers questions appropriately. Mood is congruent.   ASSESSMENT / PLAN:   [name] syndrome Seem to have found a workable regimen for him so we will continue this and follow him back in 3 months.  Essential Hypertension Blood pressure is fairly well-controlled we will continue current regimen.  He is religious with his medications.  NICM (nonischemic cardiomyopathy) (HCC) No symptoms currently.  OBESITY, MORBID Unfortunately he is not able to get much exercise currently.  As though summer rolls around, he is going to have difficulty getting out because of the heat and during the winter it is probably related to the cold temperatures.  He does not have an external facility where he could exercise safely.  Suicidal ideation He said no more suicidal ideation, no more hallucinations of the visual  kind.  Visual hallucinations Resolved currently on new medication regimen.  Follow-up 3 months.   Violetta Grice MD

## 2023-09-14 ENCOUNTER — Telehealth: Payer: Self-pay | Admitting: Physician Assistant

## 2023-09-14 MED ORDER — DAPAGLIFLOZIN PROPANEDIOL 10 MG PO TABS: 10.0000 mg | ORAL_TABLET | Freq: Every day | ORAL | 3 refills | Status: AC

## 2023-09-14 NOTE — Telephone Encounter (Signed)
 RX sent to requested Pharmacy

## 2023-09-14 NOTE — Telephone Encounter (Signed)
*  STAT* If patient is at the pharmacy, call can be transferred to refill team.   1. Which medications need to be refilled? (please list name of each medication and dose if known)   dapagliflozin  propanediol (FARXIGA ) 10 MG TABS tablet    4. Which pharmacy/location (including street and city if local pharmacy) is medication to be sent to?  [id number] Jonette Nestle, [place] - [address] AT [facility] [address] Phone: [phone]  Fax: [phone]       5. Do they need a 30 day or 90 day supply? 90  Pt spouse states he is completely out

## 2023-09-18 ENCOUNTER — Telehealth: Payer: Self-pay | Admitting: Licensed Clinical Social Worker

## 2023-09-18 ENCOUNTER — Other Ambulatory Visit: Payer: Self-pay | Admitting: Family Medicine

## 2023-09-18 ENCOUNTER — Encounter: Payer: Self-pay | Admitting: Licensed Clinical Social Worker

## 2023-09-28 ENCOUNTER — Ambulatory Visit: Admitting: Podiatry

## 2023-09-28 ENCOUNTER — Other Ambulatory Visit: Payer: Self-pay

## 2023-09-28 DIAGNOSIS — G8929 Other chronic pain: Secondary | ICD-10-CM

## 2023-09-28 DIAGNOSIS — M170 Bilateral primary osteoarthritis of knee: Secondary | ICD-10-CM

## 2023-09-29 ENCOUNTER — Other Ambulatory Visit: Payer: Self-pay

## 2023-09-29 MED ORDER — HYDROCODONE-ACETAMINOPHEN 5-325 MG PO TABS: ORAL_TABLET | ORAL | 0 refills | Status: DC

## 2023-09-29 MED ORDER — GLIMEPIRIDE 4 MG PO TABS: 4.0000 mg | ORAL_TABLET | Freq: Every day | ORAL | 0 refills | Status: DC

## 2023-09-30 ENCOUNTER — Other Ambulatory Visit: Payer: Self-pay | Admitting: Family Medicine

## 2023-10-06 NOTE — Progress Notes (Signed)
 Remote ICD transmission.

## 2023-10-21 ENCOUNTER — Ambulatory Visit (INDEPENDENT_AMBULATORY_CARE_PROVIDER_SITE_OTHER): Admitting: Family Medicine

## 2023-10-21 VITALS — BP 129/97 | HR 93 | Wt 298.8 lb

## 2023-10-21 DIAGNOSIS — H4010X3 Unspecified open-angle glaucoma, severe stage: Secondary | ICD-10-CM | POA: Diagnosis not present

## 2023-10-21 DIAGNOSIS — H5316 Psychophysical visual disturbances: Secondary | ICD-10-CM | POA: Diagnosis not present

## 2023-10-21 DIAGNOSIS — Z794 Long term (current) use of insulin: Secondary | ICD-10-CM | POA: Diagnosis not present

## 2023-10-21 DIAGNOSIS — M170 Bilateral primary osteoarthritis of knee: Secondary | ICD-10-CM

## 2023-10-21 DIAGNOSIS — R059 Cough, unspecified: Secondary | ICD-10-CM | POA: Diagnosis not present

## 2023-10-21 DIAGNOSIS — I5022 Chronic systolic (congestive) heart failure: Secondary | ICD-10-CM

## 2023-10-21 DIAGNOSIS — N1832 Chronic kidney disease, stage 3b: Secondary | ICD-10-CM

## 2023-10-21 DIAGNOSIS — M25569 Pain in unspecified knee: Secondary | ICD-10-CM | POA: Diagnosis not present

## 2023-10-21 DIAGNOSIS — G8929 Other chronic pain: Secondary | ICD-10-CM | POA: Diagnosis not present

## 2023-10-21 DIAGNOSIS — E1122 Type 2 diabetes mellitus with diabetic chronic kidney disease: Secondary | ICD-10-CM | POA: Diagnosis not present

## 2023-10-21 MED ORDER — LATANOPROST 0.005 % OP SOLN: 1.0000 [drp] | Freq: Every evening | OPHTHALMIC | 3 refills | Status: AC

## 2023-10-21 MED ORDER — DORZOLAMIDE HCL-TIMOLOL MAL 2-0.5 % OP SOLN: 1.0000 [drp] | Freq: Two times a day (BID) | OPHTHALMIC | 3 refills | Status: AC

## 2023-10-21 MED ORDER — NEOMYCIN-POLYMYXIN-DEXAMETH 3.5-10000-0.1 OP OINT: 1.0000 | TOPICAL_OINTMENT | Freq: Every day | OPHTHALMIC | 11 refills | Status: AC

## 2023-10-21 MED ORDER — HYDROCODONE-ACETAMINOPHEN 5-325 MG PO TABS: ORAL_TABLET | ORAL | 0 refills | Status: DC

## 2023-10-21 MED ORDER — IPRATROPIUM BROMIDE 0.03 % NA SOLN: 2.0000 | Freq: Two times a day (BID) | NASAL | 12 refills | Status: AC

## 2023-10-21 NOTE — Assessment & Plan Note (Signed)
 Seems to be doing OK on this regimen so we will continue and f/u 3 m

## 2023-10-21 NOTE — Assessment & Plan Note (Signed)
 Good control at this time. No medication changes. Refills given

## 2023-10-21 NOTE — Assessment & Plan Note (Signed)
 Refilled his current meds. He is not sure of next optho appt.

## 2023-10-21 NOTE — Patient Instructions (Signed)
 Let me see you in 2-3 months. Great to see you!

## 2023-10-21 NOTE — Progress Notes (Signed)
    CHIEF COMPLAINT / HPI:  F/u chronic kidney disease, CHF, [name] syndrome. Needs a form filled out for transaid. Feels like he is doing well. No episodes of low blood sugar, no new increase in his SOB with exertion. No new edema. Taking his meds regularly and does need several refills  L knee is bothering him more and pain meds not quite taking care of pain, stays at about 3/110 most of time. Does not want to explore any change or increase in pain meds at this time. Still living with his son who is now off of parole.  Had significant improvement in nasal drainage  and subsequent cough with the nasal spray. Needs refills Also needs refills on eye drops.   PERTINENT  PMH / PSH: I have reviewed the patient's medications, allergies, past medical and surgical history, smoking status and updated in the EMR as appropriate.   OBJECTIVE:  BP (!) 129/97   Pulse 93   Wt 298 lb 12.8 oz (135.5 kg)   SpO2 99%   BMI 54.65 kg/m  PSYCH: AxOx4. Good eye contact.. No psychomotor retardation or agitation. Appropriate speech fluency and content. Asks and answers questions appropriately. Mood is congruent. CV RRR ABD Obese, soft    ASSESSMENT / PLAN:   [name] syndrome Seems to be doing OK on this regimen so we will continue and f/u 3 m  Chronic systolic heart failure (HCC) Stable. He also follows with cardiology.   Type 2 diabetes mellitus with stage 3b chronic kidney disease, with long-term current use of insulin  (HCC) Good control at this time. No medication changes. Refills given  Glaucoma Refilled his current meds. He is not sure of next optho appt.   Adam Mulch MD

## 2023-10-21 NOTE — Assessment & Plan Note (Signed)
 Stable. He also follows with cardiology.

## 2023-10-23 ENCOUNTER — Telehealth: Payer: Self-pay | Admitting: *Deleted

## 2023-10-23 NOTE — Progress Notes (Signed)
 Complex Care Management Care Guide Note  10/23/2023 Name: [name] MRN: [id number] DOB: [birth year]  [name] is a 63 y.o. year old male who is a primary care patient of Rosalynn Camie LITTIE, MD and is actively engaged with the care management team. I reached out to [name] by phone today to assist with re-scheduling  with the Licensed Clinical Child psychotherapist.  Follow up plan: Telephone appointment with complex care management team member scheduled for:  11/12/23  Harlene Satterfield  [facility]  [facility], [facility] Guide  Direct Dial: [phone]  Fax [phone]

## 2023-10-23 NOTE — Progress Notes (Signed)
 Complex Care Management Care Guide Note  10/23/2023 Name: BONNIE ROIG MRN: [id number] DOB: 12-24-1960  [name] is a 63 y.o. year old male who is a primary care patient of Rosalynn Camie LITTIE, MD and is actively engaged with the care management team. I reached out to [name] by phone today to assist with re-scheduling  with the Licensed Clinical Child psychotherapist.  Follow up plan: Unsuccessful telephone outreach attempt made. A HIPAA compliant phone message was left for the patient providing contact information and requesting a return call.  Harlene Satterfield  [facility]  [facility], [facility] Guide  Direct Dial: [phone]  Fax [phone]

## 2023-10-26 ENCOUNTER — Ambulatory Visit: Attending: Cardiovascular Disease

## 2023-10-26 DIAGNOSIS — I5022 Chronic systolic (congestive) heart failure: Secondary | ICD-10-CM | POA: Diagnosis not present

## 2023-10-26 DIAGNOSIS — Z95810 Presence of automatic (implantable) cardiac defibrillator: Secondary | ICD-10-CM | POA: Diagnosis not present

## 2023-10-29 ENCOUNTER — Other Ambulatory Visit: Payer: Self-pay | Admitting: Family Medicine

## 2023-10-29 NOTE — Progress Notes (Signed)
 EPIC Encounter for ICM Monitoring  Patient Name: [name] is a 63 y.o. male Date: 10/29/2023 Primary Care Physican: Rosalynn Camie LITTIE, MD Primary Cardiologist: Levern  Electrophysiologist: Mealor Nephrologist: [facility] 04/22/2023 Office Weight: 322 lbs 07/30/2023 Weight: 311 lbs   10/21/2023 Weight: 298 lbs                           Attempted call to wife and unable to reach.  Left detailed message per DPR regarding transmission.  Transmission results reviewed.     Optivol thoracic impedance suggesting normal fluid levels with the exception of possible fluid accumulation from 6/19-7/4.   Prescribed:  Furosemide  40 mg take 1 tablet(s) (40 mg total) by mouth daily (per 08/27/23 OV note with Charlies Arthur, PA, patient takes extra if needed).  Wife reports on 5/28 he takes Lasix  PRN   Labs: 08/27/2023 Creatinine 1.67, BUN 18, Potassium 4.1, Sodium 146, GFR 45 07/14/2023 Creatinine 1.40, BUN 19, Potassium 3.2, Sodium 137, GFR 56  07/10/2023 Creatinine 1.65, BUN 19, Potassium 3.2, Sodium 143, GFR 46  07/09/2023 Creatinine 1.66, BUN 20, Potassium 3.2, Sodium 141, GFR 46  06/23/2023 Creatinine 1.54, BUN 12, Potassium 4.0, Sodium 142, GFR 50 04/22/2023 Creatinine 1.72, BUN 19, Potassium 4.5, Sodium 145, GFR 44 A complete set of results can be found in Results Review.   Recommendations:   Left voice mail with ICM number and encouraged to call if experiencing any fluid symptoms.   Follow-up plan: ICM clinic phone appointment on 11/30/2023.   91 day device clinic remote transmission 11/27/2023.     EP/Cardiology Office Visits:  Recall 08/26/2024 with Charlies Arthur, PA.   Copy of ICM check sent to Dr. Nancey.    3 month ICM trend: 10/26/2023.    12-14 Month ICM trend:     Mitzie GORMAN Garner, RN 10/29/2023 3:44 PM

## 2023-11-03 ENCOUNTER — Other Ambulatory Visit: Payer: Self-pay

## 2023-11-03 DIAGNOSIS — M109 Gout, unspecified: Secondary | ICD-10-CM

## 2023-11-03 DIAGNOSIS — M79672 Pain in left foot: Secondary | ICD-10-CM

## 2023-11-03 MED ORDER — DICLOFENAC SODIUM 1 % EX GEL: 2.0000 g | Freq: Four times a day (QID) | CUTANEOUS | 2 refills | Status: AC | PRN

## 2023-11-06 ENCOUNTER — Other Ambulatory Visit: Payer: Self-pay

## 2023-11-06 MED ORDER — GLIMEPIRIDE 4 MG PO TABS: 4.0000 mg | ORAL_TABLET | Freq: Every day | ORAL | 1 refills | Status: AC

## 2023-11-12 ENCOUNTER — Other Ambulatory Visit: Payer: Self-pay | Admitting: Licensed Clinical Social Worker

## 2023-11-12 NOTE — Patient Outreach (Signed)
 Complex Care Management   Visit Note  11/12/2023  Name:  [name] MRN: [id number] DOB: [birth year]  Situation: Referral received for Complex Care Management related to Mental/Behavioral Health diagnosis Hx of visual hallucinations and SI I obtained verbal consent from Patient.  Visit completed with pt's spouse, [name]  on the phone  Background:   Past Medical History:  Diagnosis Date   Arthritis    Arthritis of knee, degenerative 10/03/2011   Right > LEFT KNEE oa X-rays were not standing but showed some mild sclerosis and joint space loss of the medial compartment of the right knee. History of arthroscopy right knee. Report of injury as a child but unclear exactly what that was.  :I am aware of 12/01/18 Rx by Dr. Rosalynn.  While wife was hospitalized, narcotic Rx went missing when in-home sitter left.  To my knowledge, this is the first mi   Automatic implantable cardioverter-defibrillator in situ 07/15/2013   Blind in both eyes 06/15/2013   Right Sees Dr Octavia   Cataracts, bilateral    right   Chronic gout due to renal impairment of multiple sites without tophus 04/12/2020   Chronic systolic heart failure (HCC) 02/18/2007   Qualifier: Diagnosis of  By: Jil MD, Josette RAMAN    Congestive heart failure (HCC)    a. s/p MDT single chamber ICD    Diabetes mellitus    does not check blood sugars at home   Diverticulosis    Encounter for chronic pain management 05/17/2014   Indication for chronic opioid: severe arthritis RIGHTknee / low back Medication and dose: hydrocodone ---uses prn # pills per month: gets #120 pills to last TWO months Last UDS date: 05/17/2014 Pain contract signed (Y/N): Y 05/17/2014 Date narcotic database last reviewed (include red flags): 05/17/2014    Glaucoma    bilateral   Glaucoma 11/07/2015   Severe See Dr. Octavia   H/O TIA (transient ischemic attack) and stroke    Hyperlipidemia    Hyperlipidemia with target low density lipoprotein (LDL) cholesterol less than 100  mg/dL 4/84/7986   Hypertension    HYPERTENSION, BENIGN 02/18/2007   Qualifier: Diagnosis of  By: Rosalynn MD, Camie Boer of both feet 04/12/2020   NICM (nonischemic cardiomyopathy) (HCC) 05/30/2015   Obesity    OBESITY, MORBID 05/21/2010   Qualifier: Diagnosis of  By: Fernande, MD, CODY Elspeth Darner    Obstructive sleep apnea 03/27/2009   Qualifier: Diagnosis of  By: Silver, RN, BSN, Melanie     Paroxysmal atrial fibrillation (HCC) 09/21/2019   Paroxysmal VT (HCC) 06/22/2019   RENAL DISEASE, CHRONIC, STAGE III 02/18/2007   SHOULDER PAIN, LEFT 08/01/2009   History of left shoulder surgery.     Single implantable cardioverter-defibrillator (ICD) in situ 06/17/2011   Type 2 diabetes mellitus with stage 4 chronic kidney disease, without long-term current use of insulin  (HCC) 08/25/2007    Assessment: Patient Reported Symptoms:  Cognitive Cognitive Status: No symptoms reported, Alert and oriented to person, place, and time (Per spouse) Cognitive/Intellectual Conditions Management [RPT]: None reported or documented in medical history or problem list   Health Maintenance Behaviors: Annual physical exam  Neurological Neurological Review of Symptoms: Not assessed    HEENT HEENT Symptoms Reported: Not assessed      Cardiovascular Cardiovascular Symptoms Reported: Not assessed    Respiratory Respiratory Symptoms Reported: Not assesed    Endocrine Endocrine Symptoms Reported: Not assessed    Gastrointestinal Gastrointestinal Symptoms Reported: Not assessed  Genitourinary Genitourinary Symptoms Reported: Not assessed    Integumentary Integumentary Symptoms Reported: Not assessed    Musculoskeletal Musculoskelatal Symptoms Reviewed: Not assessed        Psychosocial Psychosocial Symptoms Reported: No symptoms reported Additional Psychological Details: Patient is doing well and has no other mental health episodes since last hospitalization over three months ago. Behavioral  Management Strategies: Adequate rest, Coping strategies, Support system Behavioral Health Self-Management Outcome: 5 (very good) Major Change/Loss/Stressor/Fears (CP): Denies Techniques to Cope with Loss/Stress/Change: Medication Quality of Family Relationships: involved, helpful, supportive      07/29/2023    9:28 AM  Depression screen PHQ 2/9  Decreased Interest 2  Down, Depressed, Hopeless 2  PHQ - 2 Score 4  Altered sleeping 3  Tired, decreased energy 3  Change in appetite 0  Feeling bad or failure about yourself  2  Trouble concentrating 0  Moving slowly or fidgety/restless 0  Suicidal thoughts 0  PHQ-9 Score 12    There were no vitals filed for this visit.  Medications Reviewed Today     Reviewed by Daryl Beehler D, LCSW (Social Worker) on 11/12/23 at 1450  Med List Status: <None>   Medication Order Taking? Sig Documenting Provider Last Dose Status Informant  allopurinol  (ZYLOPRIM ) 100 MG tablet [id number]  Take 0.5 tablets (50 mg total) by mouth daily. Rosalynn Camie CROME, MD  Active   apixaban  (ELIQUIS ) 5 MG TABS tablet [id number] No Take 1 tablet (5 mg total) by mouth 2 (two) times daily. Fernande Elspeth BROCKS, MD Taking Active Self, Pharmacy Records, Multiple Informants  brimonidine  (ALPHAGAN ) 0.2 % ophthalmic solution [id number] No Place 1 drop into both eyes 2 (two) times daily. [provider] Taking Active Multiple Informants, Self, Pharmacy Records  carvedilol  (COREG ) 25 MG tablet [id number] No Take 1 tablet (25 mg total) by mouth 2 (two) times daily with a meal. Rosalynn Camie CROME, MD Taking Active Multiple Informants, Self, Pharmacy Records  cetirizine  (ZYRTEC  ALLERGY) 10 MG tablet [id number] No Take 1 tablet (10 mg total) by mouth daily. Rosalynn Camie CROME, MD Taking Active Self, Pharmacy Records, Multiple Informants  cyclobenzaprine  (FLEXERIL ) 5 MG tablet [id number]  Take 1 tablet (5 mg total) by mouth at bedtime. Rosalynn Camie CROME, MD  Active   dapagliflozin  propanediol (FARXIGA ) 10 MG  TABS tablet [id number]  Take 1 tablet (10 mg total) by mouth daily. Fernande Elspeth BROCKS, MD  Active   diclofenac  Sodium (VOLTAREN ) 1 % GEL [id number]  Apply 2 g topically 4 (four) times daily as needed (right knee pain). Rosalynn Camie CROME, MD  Active   dorzolamide -timolol  (COSOPT ) 2-0.5 % ophthalmic solution [id number]  Place 1 drop into both eyes 2 (two) times daily. Rosalynn Camie CROME, MD  Active   EPINEPHrine  0.3 mg/0.3 mL IJ SOAJ injection [id number] No Inject 0.3 mg into the muscle as needed for anaphylaxis. Rosalynn Camie CROME, MD Taking Active Self, Pharmacy Records, Multiple Informants  fluticasone  (FLOVENT  HFA) 110 MCG/ACT inhaler [id number] No Inhale 1 puff into the lungs daily at 12 noon. [provider] Taking Active Self, Pharmacy Records, Multiple Informants  furosemide  (LASIX ) 40 MG tablet [id number] No Take 1 tablet (40 mg total) by mouth as directed. Strength: 40 mg  Patient taking differently: Take 80 mg by mouth in the morning, at noon, and at bedtime.   Rosalynn Camie CROME, MD Taking Active Self, Pharmacy Records, Multiple Informants  glimepiride  (AMARYL ) 4 MG tablet [id number]  Take 1 tablet (4 mg total) by mouth daily with breakfast. Rosalynn Camie  L, MD  Active   HYDROcodone -acetaminophen  (NORCO/VICODIN) 5-325 MG tablet [id number]  TAKE ONE TABLET BY MOUTH FOUR TIMES DAILY. MAY take ONE additional for a total of FIVE PER DAY Rosalynn Camie CROME, MD  Active   insulin  glargine (LANTUS  SOLOSTAR) 100 UNIT/ML Solostar Pen [id number] No Inject 25 Units into the skin daily. Rosalynn Camie CROME, MD Taking Active   ipratropium (ATROVENT ) 0.03 % nasal spray [id number]  Place 2 sprays into both nostrils every 12 (twelve) hours. Rosalynn Camie CROME, MD  Active   isosorbide  mononitrate (ISMO ) 20 MG tablet [id number] No Take 1 tablet (20 mg total) by mouth 2 (two) times daily. Rosalynn Camie CROME, MD Taking Active Self, Pharmacy Records, Multiple Informants  latanoprost  (XALATAN ) 0.005 % ophthalmic solution [id number]  Place 1 drop into both eyes every  evening. Rosalynn Camie CROME, MD  Active   mometasone -formoterol  (DULERA ) 200-5 MCG/ACT AERO [id number] No Inhale 2 puffs into the lungs daily. Rosalynn Camie CROME, MD Taking Active Self, Pharmacy Records, Multiple Informants  naloxone  (NARCAN ) nasal spray 4 mg/0.1 mL [id number] No place ONE SPRAY into THE nostril AS NEEDED (accidential overdose)  Patient taking differently: Place 1 spray into the nose once as needed (Accidental overdose).   Rosalynn Camie CROME, MD Taking Active Self, Pharmacy Records, Multiple Informants  neomycin -polymyxin b-dexamethasone  (MAXITROL ) 3.5-10000-0.1 OINT [id number]  Place 1 Application into both eyes at bedtime. Rosalynn Camie CROME, MD  Active   nitroGLYCERIN  (NITROSTAT ) 0.4 MG SL tablet [id number] No Place 0.4 mg under the tongue every 5 (five) minutes as needed for chest pain. [provider] Taking Active Multiple Informants, Self, Pharmacy Records  QUEtiapine  (SEROQUEL ) 50 MG tablet [id number]  Take 1 tablet (50 mg total) by mouth at bedtime. Rosalynn Camie CROME, MD  Active   risperiDONE  (RISPERDAL ) 1 MG tablet [id number]  TAKE 1 TABLET(1 MG) BY MOUTH TWICE DAILY Rosalynn Camie CROME, MD  Active   rosuvastatin  (CRESTOR ) 10 MG tablet [id number] No Take 1 tablet (10 mg total) by mouth daily. Rosalynn Camie CROME, MD Taking Active Self, Pharmacy Records, Multiple Informants  sacubitril -valsartan  (ENTRESTO ) 49-51 MG [id number]  Take 1 tablet by mouth 2 (two) times daily. Leverne Charlies Helling, PA-C  Active   Semaglutide  3 MG TABS [id number]  Take 1 tablet (3 mg total) by mouth daily. Take one tablet on empty stomach first thing in the AM Rosalynn Camie CROME, MD  Active   Vitamin D, Ergocalciferol, (DRISDOL) 1.25 MG (50000 UNIT) CAPS capsule [id number] No Take 50,000 Units by mouth once a week. [provider] Taking Active Self, Pharmacy Records, Multiple Informants  Med List Note Steffi Nian, CPhT 07/10/23 1141): Son assists with medication             Recommendation:   Continue Current Plan of  Care  Follow Up Plan:   Patient has met all care management goals. Care Management case will be closed. Patient has been provided contact information should new needs arise.   Rolin Ezzard [facility]  [facility], [facility] Clinical Social Worker Direct Dial: [phone]  Fax: [phone] Website: [email or web address] 2:55 PM

## 2023-11-12 NOTE — Patient Instructions (Signed)
 Visit Information  Thank you for taking time to visit with me today. Please don't hesitate to contact me if I can be of assistance to you before our next scheduled appointment.   Closing From: Complex Care Management.  Please call the care guide team at [phone] if you need to cancel, schedule, or reschedule an appointment.   Please call the Suicide and Crisis Lifeline: 988 go to [facility] [address] [place] [phone]) call 911 if you are experiencing a Mental Health or Behavioral Health Crisis or need someone to talk to.  Rolin Kerns, LCSW [place]  [facility], [facility] Clinical Social Worker Direct Dial: [phone]  Fax: [phone] Website: [email or web address] 2:55 PM

## 2023-11-16 ENCOUNTER — Telehealth: Payer: Self-pay

## 2023-11-16 NOTE — Telephone Encounter (Signed)
 Received call from patient's wife regarding follow up from Cardiologist (Dr. Levern).   [name] reports that he recommends that patient be started on a weight loss medication as soon as possible.   Dr. Rosalynn does not have any availability for August or September, October schedule is not out yet.   Scheduled patient with Dr. Tharon on Thursday morning.   [name] English, RN

## 2023-11-17 ENCOUNTER — Other Ambulatory Visit: Payer: Self-pay | Admitting: Family Medicine

## 2023-11-19 ENCOUNTER — Ambulatory Visit: Admitting: Family Medicine

## 2023-11-24 ENCOUNTER — Other Ambulatory Visit: Payer: Self-pay

## 2023-11-24 MED ORDER — SEMAGLUTIDE 3 MG PO TABS: 3.0000 mg | ORAL_TABLET | Freq: Every day | ORAL | 3 refills | Status: DC

## 2023-11-25 ENCOUNTER — Other Ambulatory Visit: Payer: Self-pay

## 2023-11-25 DIAGNOSIS — G8929 Other chronic pain: Secondary | ICD-10-CM

## 2023-11-25 DIAGNOSIS — M170 Bilateral primary osteoarthritis of knee: Secondary | ICD-10-CM

## 2023-11-26 ENCOUNTER — Ambulatory Visit: Payer: Medicare Other

## 2023-11-26 DIAGNOSIS — Z0000 Encounter for general adult medical examination without abnormal findings: Secondary | ICD-10-CM | POA: Diagnosis not present

## 2023-11-26 NOTE — Patient Instructions (Signed)
 [name] , Thank you for taking time out of your busy schedule to complete your Annual Wellness Visit with me. I enjoyed our conversation and look forward to speaking with you again next year. I, as well as your care team,  appreciate your ongoing commitment to your health goals. Please review the following plan we discussed and let me know if I can assist you in the future.   Follow up Visits: 11/28/24 @ 2:20 PM BY PHONE W/ WIFE We will see or speak with you next year for your Next Medicare AWV with our clinical staff Have you seen your provider in the last 6 months (3 months if uncontrolled diabetes)? Yes  Clinician Recommendations:  Aim for 30 minutes of exercise or brisk walking, 6-8 glasses of water, and 5 servings of fruits and vegetables each day. TAKE CARE!      This is a list of the screenings recommended for you:  Health Maintenance  Topic Date Due   Zoster (Shingles) Vaccine (1 of 2) Never done   Pneumococcal Vaccine for age over 65 (2 of 2 - PCV) 05/18/2015   Colon Cancer Screening  07/22/2016   Complete foot exam   05/07/2021   COVID-19 Vaccine (7 - Pfizer risk 2024-25 season) 10/20/2023   Flu Shot  11/06/2023   Yearly kidney health urinalysis for diabetes  12/10/2023   Hemoglobin A1C  03/10/2024   Yearly kidney function blood test for diabetes  08/26/2024   Medicare Annual Wellness Visit  11/25/2024   DTaP/Tdap/Td vaccine (3 - Td or Tdap) 11/30/2028   Hepatitis C Screening  Completed   HIV Screening  Completed   Hepatitis B Vaccine  Aged Out   HPV Vaccine  Aged Out   Meningitis B Vaccine  Aged Out    Advanced directives: (ACP Link)Information on Advanced Care Planning can be found at [place]  Best boy Advance Health Care Directives Advance Health Care Directives. [email or web address]  Advance Care Planning is important because it:  [x]  Makes sure you receive the medical care that is consistent with your values, goals, and preferences  [x]  It provides guidance  to your family and loved ones and reduces their decisional burden about whether or not they are making the right decisions based on your wishes.  Follow the link provided in your after visit summary or read over the paperwork we have mailed to you to help you started getting your Advance Directives in place. If you need assistance in completing these, please reach out to us  so that we can help you!

## 2023-11-26 NOTE — Progress Notes (Signed)
 Subjective:   [name] is a 63 y.o. who presents for a Medicare Wellness preventive visit.  As a reminder, Annual Wellness Visits don't include a physical exam, and some assessments may be limited, especially if this visit is performed virtually. We may recommend an in-person follow-up visit with your provider if needed.  Visit Complete: Virtual I connected with  [name] on 11/26/23 by a audio enabled telemedicine application and verified that I am speaking with the correct person using two identifiers.  Patient Location: Home  Provider Location: Home Office  I discussed the limitations of evaluation and management by telemedicine. The patient expressed understanding and agreed to proceed.  Vital Signs: Because this visit was a virtual/telehealth visit, some criteria may be missing or patient reported. Any vitals not documented were not able to be obtained and vitals that have been documented are patient reported.  VideoDeclined- This patient declined Librarian, academic. Therefore the visit was completed with audio only.  Persons Participating in Visit: Patient assisted by [name], [name].  AWV Questionnaire: No: Patient Medicare AWV questionnaire was not completed prior to this visit.  Cardiac Risk Factors include: advanced age (>41men, >56 women);diabetes mellitus;hypertension;dyslipidemia;sedentary lifestyle;obesity (BMI >30kg/m2);male gender     Objective:    Today's Vitals   11/26/23 1557  PainSc: 0-No pain   There is no height or weight on file to calculate BMI.     11/26/2023    4:08 PM 10/21/2023    9:01 AM 07/23/2023    1:15 PM 07/10/2023    8:17 AM 07/09/2023   11:04 PM 06/23/2023    6:33 AM 06/17/2023    8:47 AM  Advanced Directives  Does Patient Have a Medical Advance Directive? No No Yes No No No No  Would patient like information on creating a medical advance directive? No - Patient declined   No - Patient declined  No  - Patient declined No - Patient declined    Current Medications (verified) Outpatient Encounter Medications as of 11/26/2023  Medication Sig   allopurinol  (ZYLOPRIM ) 100 MG tablet Take 0.5 tablets (50 mg total) by mouth daily.   apixaban  (ELIQUIS ) 5 MG TABS tablet Take 1 tablet (5 mg total) by mouth 2 (two) times daily.   brimonidine  (ALPHAGAN ) 0.2 % ophthalmic solution Place 1 drop into both eyes 2 (two) times daily.   carvedilol  (COREG ) 25 MG tablet Take 1 tablet (25 mg total) by mouth 2 (two) times daily with a meal.   cetirizine  (ZYRTEC  ALLERGY) 10 MG tablet Take 1 tablet (10 mg total) by mouth daily.   cyclobenzaprine  (FLEXERIL ) 5 MG tablet Take 1 tablet (5 mg total) by mouth at bedtime.   dapagliflozin  propanediol (FARXIGA ) 10 MG TABS tablet Take 1 tablet (10 mg total) by mouth daily.   diclofenac  Sodium (VOLTAREN ) 1 % GEL Apply 2 g topically 4 (four) times daily as needed (right knee pain).   dorzolamide -timolol  (COSOPT ) 2-0.5 % ophthalmic solution Place 1 drop into both eyes 2 (two) times daily.   EPINEPHrine  0.3 mg/0.3 mL IJ SOAJ injection Inject 0.3 mg into the muscle as needed for anaphylaxis.   fluticasone  (FLOVENT  HFA) 110 MCG/ACT inhaler Inhale 1 puff into the lungs daily at 12 noon.   furosemide  (LASIX ) 40 MG tablet Take 1 tablet (40 mg total) by mouth as directed. Strength: 40 mg   glimepiride  (AMARYL ) 4 MG tablet Take 1 tablet (4 mg total) by mouth daily with breakfast.   HYDROcodone -acetaminophen  (NORCO/VICODIN) 5-325 MG tablet  TAKE ONE TABLET BY MOUTH FOUR TIMES DAILY. MAY take ONE additional for a total of FIVE PER DAY   insulin  glargine (LANTUS  SOLOSTAR) 100 UNIT/ML Solostar Pen Inject 25 Units into the skin daily.   isosorbide  mononitrate (ISMO ) 20 MG tablet Take 1 tablet (20 mg total) by mouth 2 (two) times daily.   latanoprost  (XALATAN ) 0.005 % ophthalmic solution Place 1 drop into both eyes every evening.   mometasone -formoterol  (DULERA ) 200-5 MCG/ACT AERO Inhale 2  puffs into the lungs daily.   naloxone  (NARCAN ) nasal spray 4 mg/0.1 mL place ONE SPRAY into THE nostril AS NEEDED (accidential overdose)   neomycin -polymyxin b-dexamethasone  (MAXITROL ) 3.5-10000-0.1 OINT Place 1 Application into both eyes at bedtime.   nitroGLYCERIN  (NITROSTAT ) 0.4 MG SL tablet Place 0.4 mg under the tongue every 5 (five) minutes as needed for chest pain.   QUEtiapine  (SEROQUEL ) 50 MG tablet Take 1 tablet (50 mg total) by mouth at bedtime.   risperiDONE  (RISPERDAL ) 1 MG tablet TAKE 1 TABLET(1 MG) BY MOUTH TWICE DAILY   rosuvastatin  (CRESTOR ) 10 MG tablet Take 1 tablet (10 mg total) by mouth daily.   sacubitril -valsartan  (ENTRESTO ) 49-51 MG Take 1 tablet by mouth 2 (two) times daily.   Semaglutide  3 MG TABS Take 1 tablet (3 mg total) by mouth daily. Take one tablet on empty stomach first thing in the AM   Vitamin D, Ergocalciferol, (DRISDOL) 1.25 MG (50000 UNIT) CAPS capsule Take 50,000 Units by mouth once a week.   ipratropium (ATROVENT ) 0.03 % nasal spray Place 2 sprays into both nostrils every 12 (twelve) hours. (Patient not taking: Reported on 11/26/2023)   No facility-administered encounter medications on file as of 11/26/2023.    Allergies (verified) Bee pollen, Bee venom, Shellfish allergy, and Nsaids   History: Past Medical History:  Diagnosis Date   Arthritis    Arthritis of knee, degenerative 10/03/2011   Right > LEFT KNEE oa X-rays were not standing but showed some mild sclerosis and joint space loss of the medial compartment of the right knee. History of arthroscopy right knee. Report of injury as a child but unclear exactly what that was.  :I am aware of 12/01/18 Rx by Dr. Rosalynn.  While [name] was hospitalized, narcotic Rx went missing when in-home sitter left.  To my knowledge, this is the first mi   Automatic implantable cardioverter-defibrillator in situ 07/15/2013   Blind in both eyes 06/15/2013   Right Sees Dr Octavia   Cataracts, bilateral    right   Chronic gout  due to renal impairment of multiple sites without tophus 04/12/2020   Chronic systolic heart failure (HCC) 02/18/2007   Qualifier: Diagnosis of  By: Jil MD, Josette RAMAN    Congestive heart failure (HCC)    a. s/p MDT single chamber ICD    Diabetes mellitus    does not check blood sugars at home   Diverticulosis    Encounter for chronic pain management 05/17/2014   Indication for chronic opioid: severe arthritis RIGHTknee / low back Medication and dose: hydrocodone ---uses prn # pills per month: gets #120 pills to last TWO months Last UDS date: 05/17/2014 Pain contract signed (Y/N): Y 05/17/2014 Date narcotic database last reviewed (include red flags): 05/17/2014    Glaucoma    bilateral   Glaucoma 11/07/2015   Severe See Dr. Octavia   H/O TIA (transient ischemic attack) and stroke    Hyperlipidemia    Hyperlipidemia with target low density lipoprotein (LDL) cholesterol less than 100 mg/dL 4/84/7986  Hypertension    HYPERTENSION, BENIGN 02/18/2007   Qualifier: Diagnosis of  By: Rosalynn MD, Camie Boer of both feet 04/12/2020   NICM (nonischemic cardiomyopathy) (HCC) 05/30/2015   Obesity    OBESITY, MORBID 05/21/2010   Qualifier: Diagnosis of  By: Fernande, MD, CODY Elspeth Darner    Obstructive sleep apnea 03/27/2009   Qualifier: Diagnosis of  By: Silver, RN, BSN, Melanie     Paroxysmal atrial fibrillation (HCC) 09/21/2019   Paroxysmal VT (HCC) 06/22/2019   RENAL DISEASE, CHRONIC, STAGE III 02/18/2007   SHOULDER PAIN, LEFT 08/01/2009   History of left shoulder surgery.     Single implantable cardioverter-defibrillator (ICD) in situ 06/17/2011   Type 2 diabetes mellitus with stage 4 chronic kidney disease, without long-term current use of insulin  (HCC) 08/25/2007   Past Surgical History:  Procedure Laterality Date   CARDIAC CATHETERIZATION  10/24/2003   EF of 45-50%   CARDIAC DEFIBRILLATOR PLACEMENT  2008   CATARACT EXTRACTION  2012   right   EP IMPLANTABLE DEVICE N/A 05/30/2015    Procedure: ICD Generator Changeout;  Surgeon: Elspeth JAYSON Fernande, MD;  Location[facility] INVASIVE CV LAB;  Service: Cardiovascular;  Laterality: N/A;   KNEE SURGERY Right 2000   SHOULDER SURGERY  2012   left   Family History  Problem Relation Age of Onset   Colon cancer Father    Esophageal cancer Neg Hx    Rectal cancer Neg Hx    Stomach cancer Neg Hx    Social History   Socioeconomic History   Marital status: Legally Separated    Spouse name: Not on file   Number of children: Not on file   Years of education: Not on file   Highest education level: Not on file  Occupational History   Not on file  Tobacco Use   Smoking status: Never   Smokeless tobacco: Never  Vaping Use   Vaping status: Never Used  Substance and Sexual Activity   Alcohol use: No   Drug use: No   Sexual activity: Not on file  Other Topics Concern   Not on file  Social History Narrative   Not on file   Social Drivers of Health   Financial Resource Strain: Low Risk  (11/26/2023)   Overall Financial Resource Strain (CARDIA)    Difficulty of Paying Living Expenses: Not hard at all  Food Insecurity: No Food Insecurity (11/26/2023)   Hunger Vital Sign    Worried About Running Out of Food in the Last Year: Never true    Ran Out of Food in the Last Year: Never true  Transportation Needs: No Transportation Needs (11/26/2023)   PRAPARE - Administrator, Civil Service (Medical): No    Lack of Transportation (Non-Medical): No  Physical Activity: Inactive (11/26/2023)   Exercise Vital Sign    Days of Exercise per Week: 0 days    Minutes of Exercise per Session: 0 min  Stress: No Stress Concern Present (11/26/2023)   [employer] of Occupational Health - Occupational Stress Questionnaire    Feeling of Stress: Not at all  Social Connections: Socially Isolated (11/26/2023)   Social Connection and Isolation Panel    Frequency of Communication with Friends and Family: More than three times a week     Frequency of Social Gatherings with Friends and Family: Once a week    Attends Religious Services: Never    Database administrator or Organizations: No    Attends [employer]  or Organization Meetings: Never    Marital Status: Separated    Tobacco Counseling Counseling given: Not Answered    Clinical Intake:  Pre-visit preparation completed: Yes  Pain : No/denies pain Pain Score: 0-No pain     BMI - recorded: 54.65 Nutritional Status: BMI > 30  Obese Nutritional Risks: None Diabetes: Yes CBG done?: No Did pt. bring in CBG monitor from home?: No  Lab Results  Component Value Date   HGBA1C 7.1 (A) 09/09/2023   HGBA1C 7.1 (A) 04/22/2023   HGBA1C 6.8 10/29/2022     How often do you need to have someone help you when you read instructions, pamphlets, or other written materials from your doctor or pharmacy?: 1 - Never  Interpreter Needed?: No  Information entered by :: JHONNIE DAS, LPN   Activities of Daily Living     11/26/2023    4:09 PM 07/10/2023    8:17 AM  In your present state of health, do you have any difficulty performing the following activities:  Hearing? 0 0  Vision? 1 1  Comment BLIND   Difficulty concentrating or making decisions? 1 0  Walking or climbing stairs? 0   Dressing or bathing? 0   Doing errands, shopping? 1   Preparing Food and eating ? Y   Using the Toilet? N   In the past six months, have you accidently leaked urine? N   Do you have problems with loss of bowel control? N   Managing your Medications? Y   Managing your Finances? Y   Housekeeping or managing your Housekeeping? Y     Patient Care Team: Rosalynn Camie CROME, MD as PCP - General Fernande Elspeth BROCKS, MD as PCP - [facility] [place]  I have updated your Care Teams any recent Medical Services you may have received from other providers in the past year.     Assessment:   This is a routine wellness examination for [name].  Hearing/Vision  screen Hearing Screening - Comments:: NO AIDS Vision Screening - Comments:: BLIND BOTH EYES- [facility] - GOING TO HAVE LEFT EYE REMOVED   Goals Addressed             This Visit's Progress    DIET - EAT MORE FRUITS AND VEGETABLES         Depression Screen     11/26/2023    4:06 PM 07/29/2023    9:28 AM 04/22/2023    8:40 AM 03/17/2023    8:46 AM 01/21/2023    9:05 AM 12/10/2022    9:48 AM 11/24/2022   10:37 AM  PHQ 2/9 Scores  PHQ - 2 Score 0 4 0 5 0 0 0  PHQ- 9 Score 0 12 0 11 0 0 0    Fall Risk     11/26/2023    4:09 PM 10/21/2023    9:01 AM 09/09/2023    8:48 AM 11/24/2022   10:40 AM 10/29/2022    9:56 AM  Fall Risk   Falls in the past year? 0 0 0 0 0  Number falls in past yr: 0   0 0  Injury with Fall? 0   0 0  Risk for fall due to : No Fall Risks   Impaired mobility;Impaired vision No Fall Risks  Follow up Falls evaluation completed;Falls prevention discussed   Falls prevention discussed;Education provided;Falls evaluation completed Falls evaluation completed    MEDICARE RISK AT HOME:  Medicare Risk at Home Any stairs in or around  the home?: Yes If so, are there any without handrails?: No Home free of loose throw rugs in walkways, pet beds, electrical cords, etc?: Yes Adequate lighting in your home to reduce risk of falls?: Yes Life alert?: No Use of a cane, walker or w/c?: Yes (CANE) Grab bars in the bathroom?: No Shower chair or bench in shower?: No Elevated toilet seat or a handicapped toilet?: No  TIMED UP AND GO:  Was the test performed?  No  Cognitive Function: 6CIT completed        11/26/2023    4:11 PM 11/24/2022   10:40 AM  6CIT Screen  What Year? 0 points 0 points  What month? 0 points 0 points  What time? 0 points 0 points  Count back from 20 0 points 0 points  Months in reverse 2 points 2 points  Repeat phrase 2 points 0 points  Total Score 4 points 2 points    Immunizations Immunization History  Administered Date(s) Administered    Influenza Split 02/11/2012   Influenza Whole 01/10/2008   Influenza, Seasonal, Injecte, Preservative Fre 12/10/2022   Influenza,inj,Quad PF,6+ Mos 01/26/2013, 12/14/2013, 01/17/2015, 02/13/2016, 12/10/2016, 02/03/2018, 12/01/2018, 01/18/2020, 02/13/2021, 02/12/2022   PFIZER(Purple Top)SARS-COV-2 Vaccination 07/08/2019, 07/29/2019, 01/18/2020   Pfizer Covid-19 Vaccine Bivalent Booster 89yrs & up 02/13/2021   Pfizer(Comirnaty)Fall Seasonal Vaccine 12 years and older 02/12/2022, 04/22/2023   Pneumococcal Polysaccharide-23 05/17/2014   Td 11/17/2007   Tdap 12/01/2018    Screening Tests Health Maintenance  Topic Date Due   Zoster Vaccines- Shingrix (1 of 2) Never done   Pneumococcal Vaccine: 50+ Years (2 of 2 - PCV) 05/18/2015   Colonoscopy  07/22/2016   FOOT EXAM  05/07/2021   COVID-19 Vaccine (7 - Pfizer risk 2024-25 season) 10/20/2023   INFLUENZA VACCINE  11/06/2023   Diabetic kidney evaluation - Urine ACR  12/10/2023   HEMOGLOBIN A1C  03/10/2024   Diabetic kidney evaluation - eGFR measurement  08/26/2024   Medicare Annual Wellness (AWV)  11/25/2024   DTaP/Tdap/Td (3 - Td or Tdap) 11/30/2028   Hepatitis C Screening  Completed   HIV Screening  Completed   Hepatitis B Vaccines 19-59 Average Risk  Aged Out   HPV VACCINES  Aged Out   Meningococcal B Vaccine  Aged Out    Health Maintenance  Health Maintenance Due  Topic Date Due   Zoster Vaccines- Shingrix (1 of 2) Never done   Pneumococcal Vaccine: 50+ Years (2 of 2 - PCV) 05/18/2015   Colonoscopy  07/22/2016   FOOT EXAM  05/07/2021   COVID-19 Vaccine (7 - Pfizer risk 2024-25 season) 10/20/2023   INFLUENZA VACCINE  11/06/2023   Diabetic kidney evaluation - Urine ACR  12/10/2023   Health Maintenance Items Addressed: DECLINES COLONOSCOPY REFERRAL; UP TO DATE ON SHOTS EXCEPT PNA, SHINGRIX  Additional Screening:  Vision Screening: Recommended annual ophthalmology exams for early detection of glaucoma and other disorders of the  eye. Would you like a referral to an eye doctor? No    Dental Screening: Recommended annual dental exams for proper oral hygiene  Community Resource Referral / Chronic Care Management: CRR required this visit?  No   CCM required this visit?  No   Plan:    I have personally reviewed and noted the following in the patient's chart:   Medical and social history Use of alcohol, tobacco or illicit drugs  Current medications and supplements including opioid prescriptions. Patient is currently taking opioid prescriptions. Information provided to patient regarding non-opioid alternatives. Patient advised  to discuss non-opioid treatment plan with their provider. Functional ability and status Nutritional status Physical activity Advanced directives List of other physicians Hospitalizations, surgeries, and ER visits in previous 12 months Vitals Screenings to include cognitive, depression, and falls Referrals and appointments  In addition, I have reviewed and discussed with patient certain preventive protocols, quality metrics, and best practice recommendations. A written personalized care plan for preventive services as well as general preventive health recommendations were provided to patient.   Jhonnie GORMAN Das, LPN   1/78/7974   After Visit Summary: (MyChart) Due to this being a telephonic visit, the after visit summary with patients personalized plan was offered to patient via MyChart   Notes: Nothing significant to report at this time.

## 2023-11-26 NOTE — Telephone Encounter (Signed)
 [name] calls nurse line again in regards to pain medication.   Advised of 48 hour policy, she apologizes for waiting to the last minute.   Will forward to PCP.

## 2023-11-26 NOTE — Telephone Encounter (Signed)
 Patient's wife left another voice message on nurse line.   She states that patient's daughter will only be able to pick up medication tomorrow morning. She is planning to pick up other medications tomorrow at 10 am. They need prescription to be sent in by 10 am tomorrow.   Wife expresses that they do not have money or transportation to pick up medication at another time.   She is requesting refill as soon as possible.   [name] English, RN

## 2023-11-27 ENCOUNTER — Ambulatory Visit (INDEPENDENT_AMBULATORY_CARE_PROVIDER_SITE_OTHER): Payer: Medicare Other

## 2023-11-27 ENCOUNTER — Ambulatory Visit: Payer: Medicare Other

## 2023-11-27 DIAGNOSIS — I428 Other cardiomyopathies: Secondary | ICD-10-CM | POA: Diagnosis not present

## 2023-11-27 MED ORDER — HYDROCODONE-ACETAMINOPHEN 5-325 MG PO TABS
ORAL_TABLET | ORAL | 0 refills | Status: DC
Start: 2023-11-27 — End: 2023-12-24

## 2023-11-30 ENCOUNTER — Ambulatory Visit: Attending: Cardiovascular Disease

## 2023-11-30 DIAGNOSIS — Z95810 Presence of automatic (implantable) cardiac defibrillator: Secondary | ICD-10-CM | POA: Diagnosis not present

## 2023-11-30 DIAGNOSIS — I5022 Chronic systolic (congestive) heart failure: Secondary | ICD-10-CM

## 2023-12-01 LAB — CUP PACEART REMOTE DEVICE CHECK
Battery Remaining Longevity: 41 mo
Battery Voltage: 2.95 V
Brady Statistic RV Percent Paced: 0.62 %
Date Time Interrogation Session: 20250822133728
HighPow Impedance: 44 Ohm
HighPow Impedance: 50 Ohm
Implantable Lead Connection Status: 753985
Implantable Lead Implant Date: 20081114
Implantable Lead Location: 753860
Implantable Lead Model: 6947
Implantable Pulse Generator Implant Date: 20170222
Lead Channel Impedance Value: 342 Ohm
Lead Channel Impedance Value: 437 Ohm
Lead Channel Pacing Threshold Amplitude: 0.625 V
Lead Channel Pacing Threshold Pulse Width: 0.4 ms
Lead Channel Sensing Intrinsic Amplitude: 6.125 mV
Lead Channel Sensing Intrinsic Amplitude: 6.125 mV
Lead Channel Setting Pacing Amplitude: 2.5 V
Lead Channel Setting Pacing Pulse Width: 0.4 ms
Lead Channel Setting Sensing Sensitivity: 0.3 mV
Zone Setting Status: 755011

## 2023-12-02 NOTE — Progress Notes (Signed)
 EPIC Encounter for ICM Monitoring  Patient Name: [name] is a 63 y.o. male Date: 12/02/2023 Primary Care Physican: Rosalynn Camie LITTIE, MD Primary Cardiologist: Levern  Electrophysiologist: Mealor Nephrologist: [facility] 04/22/2023 Office Weight: 322 lbs 07/30/2023 Weight: 311 lbs   10/21/2023 Weight: 298 lbs          Spoke with wife and heart failure questions reviewed.  Transmission results reviewed.  Pt asymptomatic for fluid accumulation.  Reports feeling well at this time and voices no complaints.      Optivol thoracic impedance suggesting normal fluid levels with the exception of possible fluid accumulation from 8/12-8/21.   Prescribed:  Furosemide  40 mg take 1 tablet(s) (40 mg total) by mouth as directed.    Labs: 08/27/2023 Creatinine 1.67, BUN 18, Potassium 4.1, Sodium 146, GFR 45 07/14/2023 Creatinine 1.40, BUN 19, Potassium 3.2, Sodium 137, GFR 56  07/10/2023 Creatinine 1.65, BUN 19, Potassium 3.2, Sodium 143, GFR 46  07/09/2023 Creatinine 1.66, BUN 20, Potassium 3.2, Sodium 141, GFR 46  06/23/2023 Creatinine 1.54, BUN 12, Potassium 4.0, Sodium 142, GFR 50 04/22/2023 Creatinine 1.72, BUN 19, Potassium 4.5, Sodium 145, GFR 44 A complete set of results can be found in Results Review.   Recommendations:   No changes and encouraged to call if experiencing any fluid symptoms.   Follow-up plan: ICM clinic phone appointment on 01/04/2024.   91 day device clinic remote transmission 02/26/2024.     EP/Cardiology Office Visits:  Recall 08/26/2024 with Charlies Arthur, PA.   Copy of ICM check sent to Dr. Nancey.    3 month ICM trend: 11/30/2023.    12-14 Month ICM trend:     Mitzie GORMAN Garner, RN 12/02/2023 4:02 PM

## 2023-12-10 ENCOUNTER — Ambulatory Visit: Payer: Self-pay | Admitting: Cardiovascular Disease

## 2023-12-24 ENCOUNTER — Other Ambulatory Visit: Payer: Self-pay

## 2023-12-24 DIAGNOSIS — G8929 Other chronic pain: Secondary | ICD-10-CM

## 2023-12-24 DIAGNOSIS — M170 Bilateral primary osteoarthritis of knee: Secondary | ICD-10-CM

## 2023-12-24 MED ORDER — HYDROCODONE-ACETAMINOPHEN 5-325 MG PO TABS: ORAL_TABLET | ORAL | 0 refills | Status: DC

## 2023-12-29 NOTE — Progress Notes (Signed)
Remote ICD Transmission.

## 2024-01-04 ENCOUNTER — Ambulatory Visit: Attending: Cardiovascular Disease

## 2024-01-04 DIAGNOSIS — I5022 Chronic systolic (congestive) heart failure: Secondary | ICD-10-CM

## 2024-01-04 DIAGNOSIS — Z95810 Presence of automatic (implantable) cardiac defibrillator: Secondary | ICD-10-CM | POA: Diagnosis not present

## 2024-01-06 ENCOUNTER — Ambulatory Visit: Admitting: Family Medicine

## 2024-01-06 DIAGNOSIS — N1832 Chronic kidney disease, stage 3b: Secondary | ICD-10-CM

## 2024-01-06 DIAGNOSIS — I5022 Chronic systolic (congestive) heart failure: Secondary | ICD-10-CM

## 2024-01-06 DIAGNOSIS — Z794 Long term (current) use of insulin: Secondary | ICD-10-CM

## 2024-01-06 DIAGNOSIS — E1122 Type 2 diabetes mellitus with diabetic chronic kidney disease: Secondary | ICD-10-CM

## 2024-01-07 NOTE — Progress Notes (Signed)
 EPIC Encounter for ICM Monitoring  Patient Name: [name] is a 63 y.o. male Date: 01/07/2024 Primary Care Physican: Rosalynn Camie LITTIE, MD Primary Cardiologist: Levern  Electrophysiologist: Mealor Nephrologist: [facility] 04/22/2023 Office Weight: 322 lbs 07/30/2023 Weight: 311 lbs   10/21/2023 Weight: 298 lbs   01/07/2024 Weight: Not weighing at home   Spoke with patient and heart failure questions reviewed.  Transmission results reviewed.  Pt asymptomatic for fluid accumulation.  Reports feeling well at this time and voices no complaints.     Since 11/30/2023 ICM Remote Transmission: Optivol thoracic impedance suggesting normal fluid levels with intermittent days of possible fluid accumulation.   Prescribed:  Furosemide  40 mg take 1 tablet(s) (40 mg total) by mouth as directed.    Labs: 08/27/2023 Creatinine 1.67, BUN 18, Potassium 4.1, Sodium 146, GFR 45 07/14/2023 Creatinine 1.40, BUN 19, Potassium 3.2, Sodium 137, GFR 56  07/10/2023 Creatinine 1.65, BUN 19, Potassium 3.2, Sodium 143, GFR 46  07/09/2023 Creatinine 1.66, BUN 20, Potassium 3.2, Sodium 141, GFR 46  06/23/2023 Creatinine 1.54, BUN 12, Potassium 4.0, Sodium 142, GFR 50 04/22/2023 Creatinine 1.72, BUN 19, Potassium 4.5, Sodium 145, GFR 44 A complete set of results can be found in Results Review.   Recommendations:   No changes and encouraged to call if experiencing any fluid symptoms.   Follow-up plan: ICM clinic phone appointment on 02/08/2024.   91 day device clinic remote transmission 02/26/2024.     EP/Cardiology Office Visits:  Recall 08/26/2024 with Charlies Arthur, PA.   Copy of ICM check sent to Dr. Nancey.     Remote monitoring is medically necessary for Heart Failure Management.    90 day Daily Thoracic Impedance ICM trend: 10/05/2023 through 01/04/2024.    12-14 Month Thoracic Impedance ICM trend:     Mitzie GORMAN Garner, RN 01/07/2024 9:53 AM

## 2024-01-08 ENCOUNTER — Telehealth: Payer: Self-pay

## 2024-01-08 MED ORDER — SEMAGLUTIDE 3 MG PO TABS: 3.0000 mg | ORAL_TABLET | Freq: Every day | ORAL | 3 refills | Status: AC

## 2024-01-08 NOTE — Telephone Encounter (Signed)
 Patient's wife called nurse line on Thursday morning regarding update on Ozempic  injection.   Spoke with Dr. Rosalynn. She is attempting to get approval for [name] , as patient would not be a good candidate for injections due to vision impairment.   Attempted to call wife back and provide update. She did not answer, VM left.     Adam JAYSON English, RN

## 2024-01-08 NOTE — Progress Notes (Signed)
 Confusion re rx for 'weight loss med' semaglutide , also for his dx chronic heart failure. Does not look like rx was ever sent or picked up in June and was removedf rom list in August. Will resend.

## 2024-01-08 NOTE — Telephone Encounter (Signed)
 I think there was some confusion abouit whether or not it was actually sent in June. I remopved from list in August (I think). Have sent it in officially now. You can let his wife know. THANKS! Sorry for the confusion (I was probably the most confused) [name]

## 2024-01-18 ENCOUNTER — Other Ambulatory Visit: Payer: Self-pay | Admitting: Pharmacist

## 2024-01-18 ENCOUNTER — Other Ambulatory Visit: Payer: Self-pay | Admitting: *Deleted

## 2024-01-18 DIAGNOSIS — I480 Paroxysmal atrial fibrillation: Secondary | ICD-10-CM

## 2024-01-18 MED ORDER — APIXABAN 5 MG PO TABS
5.0000 mg | ORAL_TABLET | Freq: Two times a day (BID) | ORAL | 1 refills | Status: AC
Start: 2024-01-18 — End: ?

## 2024-01-18 MED ORDER — APIXABAN 5 MG PO TABS: 5.0000 mg | ORAL_TABLET | Freq: Two times a day (BID) | ORAL | 1 refills | Status: DC

## 2024-01-18 NOTE — Telephone Encounter (Signed)
 Eliquis  5mg  refill request received. Patient is 63 years old, weight-135.5kg, Crea-1.67 on 08/27/23, Diagnosis-Afib, and last seen by Charlies Arthur on 08/27/23. Dose is appropriate based on dosing criteria. Will send in refill to requested pharmacy.

## 2024-01-21 ENCOUNTER — Other Ambulatory Visit: Payer: Self-pay

## 2024-01-21 DIAGNOSIS — G8929 Other chronic pain: Secondary | ICD-10-CM

## 2024-01-21 DIAGNOSIS — M170 Bilateral primary osteoarthritis of knee: Secondary | ICD-10-CM

## 2024-01-21 MED ORDER — HYDROCODONE-ACETAMINOPHEN 5-325 MG PO TABS: ORAL_TABLET | ORAL | 0 refills | Status: DC

## 2024-02-08 ENCOUNTER — Ambulatory Visit: Attending: Cardiovascular Disease

## 2024-02-08 DIAGNOSIS — I5022 Chronic systolic (congestive) heart failure: Secondary | ICD-10-CM

## 2024-02-08 DIAGNOSIS — Z95810 Presence of automatic (implantable) cardiac defibrillator: Secondary | ICD-10-CM | POA: Diagnosis not present

## 2024-02-12 NOTE — Progress Notes (Signed)
 EPIC Encounter for ICM Monitoring  Patient Name: [name] is a 63 y.o. male Date: 02/12/2024 Primary Care Physican: Rosalynn Camie LITTIE, MD Primary Cardiologist: Levern  Electrophysiologist: Mealor Nephrologist: [facility] 04/22/2023 Office Weight: 322 lbs 07/30/2023 Weight: 311 lbs   10/21/2023 Weight: 298 lbs   01/07/2024 Weight: Not weighing at home   Spoke with patient and heart failure questions reviewed.  Transmission results reviewed.  Pt asymptomatic for fluid accumulation. No fluid symptoms during decreased impedance.  Reports feeling well at this time and voices no complaints.     Since 01/04/2024 ICM Remote Transmission: Optivol thoracic impedance suggesting normal fluid levels with the exception of possible fluid accumulation from 01/05/2024-01/17/2024 and 01/30/2024-02/02/2024.   Prescribed:  Furosemide  40 mg take 1 tablet(s) (40 mg total) by mouth as directed.    Labs: 08/27/2023 Creatinine 1.67, BUN 18, Potassium 4.1, Sodium 146, GFR 45 07/14/2023 Creatinine 1.40, BUN 19, Potassium 3.2, Sodium 137, GFR 56  07/10/2023 Creatinine 1.65, BUN 19, Potassium 3.2, Sodium 143, GFR 46  07/09/2023 Creatinine 1.66, BUN 20, Potassium 3.2, Sodium 141, GFR 46  06/23/2023 Creatinine 1.54, BUN 12, Potassium 4.0, Sodium 142, GFR 50 04/22/2023 Creatinine 1.72, BUN 19, Potassium 4.5, Sodium 145, GFR 44 A complete set of results can be found in Results Review.   Recommendations:   No changes and encouraged to call if experiencing any fluid symptoms.  Advised to limit salt and fluid intake.   Follow-up plan: ICM clinic phone appointment on 03/21/2024.   91 day device clinic remote transmission 02/26/2024.     EP/Cardiology Office Visits:  Recall 08/26/2024 with Charlies Arthur, PA.   Copy of ICM check sent to Dr. Nancey.     Remote monitoring is medically necessary for Heart Failure Management.    Daily Thoracic Impedance ICM trend: 11/09/2023 through 02/08/2024.    12-14 Month Thoracic  Impedance ICM trend:     Mitzie GORMAN Garner, RN 02/12/2024 2:34 PM

## 2024-02-15 ENCOUNTER — Other Ambulatory Visit: Payer: Self-pay

## 2024-02-15 DIAGNOSIS — S39012A Strain of muscle, fascia and tendon of lower back, initial encounter: Secondary | ICD-10-CM

## 2024-02-15 MED ORDER — CYCLOBENZAPRINE HCL 5 MG PO TABS: 5.0000 mg | ORAL_TABLET | Freq: Every day | ORAL | 1 refills | Status: AC

## 2024-02-17 ENCOUNTER — Ambulatory Visit: Admitting: Family Medicine

## 2024-02-17 VITALS — BP 159/99 | HR 79 | Ht 62.0 in | Wt 290.0 lb

## 2024-02-17 DIAGNOSIS — M170 Bilateral primary osteoarthritis of knee: Secondary | ICD-10-CM

## 2024-02-17 DIAGNOSIS — N1832 Chronic kidney disease, stage 3b: Secondary | ICD-10-CM

## 2024-02-17 DIAGNOSIS — M25569 Pain in unspecified knee: Secondary | ICD-10-CM | POA: Diagnosis not present

## 2024-02-17 DIAGNOSIS — E1122 Type 2 diabetes mellitus with diabetic chronic kidney disease: Secondary | ICD-10-CM | POA: Diagnosis not present

## 2024-02-17 DIAGNOSIS — Z794 Long term (current) use of insulin: Secondary | ICD-10-CM | POA: Diagnosis not present

## 2024-02-17 DIAGNOSIS — G8929 Other chronic pain: Secondary | ICD-10-CM

## 2024-02-17 DIAGNOSIS — Z23 Encounter for immunization: Secondary | ICD-10-CM

## 2024-02-17 DIAGNOSIS — R059 Cough, unspecified: Secondary | ICD-10-CM | POA: Diagnosis not present

## 2024-02-17 LAB — POCT GLYCOSYLATED HEMOGLOBIN (HGB A1C): HbA1c, POC (controlled diabetic range): 6.2 % (ref 0.0–7.0)

## 2024-02-17 MED ORDER — CETIRIZINE HCL 10 MG PO TABS: 10.0000 mg | ORAL_TABLET | Freq: Every day | ORAL | 3 refills | Status: AC

## 2024-02-17 MED ORDER — HYDROCODONE-ACETAMINOPHEN 5-325 MG PO TABS: ORAL_TABLET | ORAL | 0 refills | Status: DC

## 2024-02-17 MED ORDER — NITROGLYCERIN 0.4 MG SL SUBL: 0.4000 mg | SUBLINGUAL_TABLET | SUBLINGUAL | 1 refills | Status: AC | PRN

## 2024-02-18 ENCOUNTER — Ambulatory Visit: Payer: Self-pay | Admitting: Family Medicine

## 2024-02-18 LAB — COMPREHENSIVE METABOLIC PANEL WITH GFR
ALT: 6 [IU]/L (ref 0–44)
AST: 9 [IU]/L (ref 0–40)
Albumin: 3.9 g/dL (ref 3.9–4.9)
Alkaline Phosphatase: 66 [IU]/L (ref 47–123)
BUN/Creatinine Ratio: 6 — ABNORMAL LOW (ref 10–24)
BUN: 9 mg/dL (ref 8–27)
Bilirubin Total: 0.8 mg/dL (ref 0.0–1.2)
CO2: 28 mmol/L (ref 20–29)
Calcium: 8.9 mg/dL (ref 8.6–10.2)
Chloride: 101 mmol/L (ref 96–106)
Creatinine, Ser: 1.55 mg/dL — ABNORMAL HIGH (ref 0.76–1.27)
Globulin, Total: 3.3 g/dL (ref 1.5–4.5)
Glucose: 136 mg/dL — ABNORMAL HIGH (ref 70–99)
Potassium: 3.1 mmol/L — ABNORMAL LOW (ref 3.5–5.2)
Sodium: 143 mmol/L (ref 134–144)
Total Protein: 7.2 g/dL (ref 6.0–8.5)
eGFR: 49 mL/min/{1.73_m2} — ABNORMAL LOW

## 2024-02-19 NOTE — Progress Notes (Signed)
   Discussed the use of AI scribe software for clinical note transcription with the patient, who gave verbal consent to proceed.  History of Present Illness   [name] is a 63 year old male with hypertension who presents for medication management and refills.  Hypertension management - Elevated blood pressure today following recent reduction in carvedilol  (Coreg ) dosage from 25 mg twice daily to 12.5 mg twice daily per heart doctor -   Analgesic medication use - Currently taking hydrocodone  and requests a refill working well - Takes cyclobenzaprine  at bedtime and really helps his pain level as well as his sleep - Pharmacy provided a 30-day supply instead of the usual 90-day supply - Awaiting new prescription to be filled by December 1st  Allergy management - Takes Cetirizine  for allergies  Immunization status and requests - Requests influenza and pneumococcal vaccinations during this visit - Previously received polysaccharide 23 pneumococcal vaccine        PERTINENT  PMH / PSH: I have reviewed the patient's medications, allergies, past medical and surgical history, smoking status.  Pertinent findings that relate to today's visit / issues include:   Physical Exam   CARDIOVASCULAR: Normal heart rate and rhythm, S1 and S2 normal without murmurs distant heart sounds LUNGS CTA B PSYCH: AxOx4. Good eye contact.. No psychomotor retardation or agitation. Appropriate speech fluency and content. Asks and answers questions appropriately. Mood is congruent. HEENT blind        Assessment and Plan    Hypertension Blood pressure elevated. Confusion about carvedilol  dosage; taking 25 mg twice daily instead of 12.5 mg. - Verify carvedilol  dosage with pharmacy and provider. - I will have our office Contact pharmacy to confirm prescription details and ensure correct dosage.May need to contact Dr. Talitha office - Ordered blood work to assess health status.  Allergic  rhinitis Requires allergy medication refill. - Refilled allergy medication.  General Health Maintenance Due for flu and pneumonia vaccinations. Previous polysaccharide 23 pneumonia vaccine received.  Chronic pain medication refilled.   Labs today Rtc 3 m

## 2024-02-22 NOTE — Progress Notes (Signed)
 [name]                                          MRN: [id number]   02/22/2024   The [facility] Quality Team Specialist reviewed this patient medical record for the purposes of chart review for care gap closure. The following were reviewed: chart review for care gap closure-kidney health evaluation for diabetes:eGFR  and uACR.    [facility] Quality Team

## 2024-02-22 NOTE — Progress Notes (Signed)
 [name]                                          MRN: [id number]   02/22/2024   The [facility] Quality Team Specialist reviewed this patient medical record for the purposes of chart review for care gap closure. The following were reviewed: abstraction for care gap closure-glycemic status assessment.    [facility] Quality Team

## 2024-02-26 ENCOUNTER — Ambulatory Visit (INDEPENDENT_AMBULATORY_CARE_PROVIDER_SITE_OTHER): Payer: Medicare Other

## 2024-02-26 DIAGNOSIS — I5022 Chronic systolic (congestive) heart failure: Secondary | ICD-10-CM

## 2024-02-26 LAB — CUP PACEART REMOTE DEVICE CHECK
Battery Remaining Longevity: 37 mo
Battery Voltage: 2.96 V
Brady Statistic RV Percent Paced: 0.09 %
Date Time Interrogation Session: 20251121093623
HighPow Impedance: 41 Ohm
HighPow Impedance: 50 Ohm
Implantable Lead Connection Status: 753985
Implantable Lead Implant Date: 20081114
Implantable Lead Location: 753860
Implantable Lead Model: 6947
Implantable Pulse Generator Implant Date: 20170222
Lead Channel Impedance Value: 342 Ohm
Lead Channel Impedance Value: 380 Ohm
Lead Channel Pacing Threshold Amplitude: 0.625 V
Lead Channel Pacing Threshold Pulse Width: 0.4 ms
Lead Channel Sensing Intrinsic Amplitude: 6.625 mV
Lead Channel Sensing Intrinsic Amplitude: 6.625 mV
Lead Channel Setting Pacing Amplitude: 2.5 V
Lead Channel Setting Pacing Pulse Width: 0.4 ms
Lead Channel Setting Sensing Sensitivity: 0.3 mV
Zone Setting Status: 755011

## 2024-02-29 NOTE — Progress Notes (Signed)
 Remote ICD Transmission

## 2024-03-08 ENCOUNTER — Ambulatory Visit: Payer: Self-pay | Admitting: Cardiovascular Disease

## 2024-03-14 ENCOUNTER — Other Ambulatory Visit: Payer: Self-pay

## 2024-03-14 DIAGNOSIS — G8929 Other chronic pain: Secondary | ICD-10-CM

## 2024-03-14 DIAGNOSIS — M170 Bilateral primary osteoarthritis of knee: Secondary | ICD-10-CM

## 2024-03-14 MED ORDER — HYDROCODONE-ACETAMINOPHEN 5-325 MG PO TABS: ORAL_TABLET | ORAL | 0 refills | Status: DC

## 2024-03-16 ENCOUNTER — Telehealth: Payer: Self-pay

## 2024-03-16 ENCOUNTER — Other Ambulatory Visit (HOSPITAL_COMMUNITY): Payer: Self-pay

## 2024-03-16 ENCOUNTER — Telehealth: Payer: Self-pay | Admitting: Family Medicine

## 2024-03-16 NOTE — Progress Notes (Signed)
 [name]                                          MRN: [id number]   03/16/2024   The [facility] Quality Team Specialist reviewed this patient medical record for the purposes of chart review for care gap closure. The following were reviewed: chart review for care gap closure-kidney health evaluation for diabetes:eGFR  and uACR.    [facility] Quality Team

## 2024-03-16 NOTE — Telephone Encounter (Signed)
 Patients wife came in stating that Dr.Neal needs to pre-authorize patients medication ( Cyclobenzaprine ) to pick up medication. Thank you.

## 2024-03-16 NOTE — Telephone Encounter (Signed)
 Patient's wife calls nurse line regarding prior authorization being needed on Hydrocodone -acetaminophen .   Will forward to [facility] for PA assistance.   [name] English, RN

## 2024-03-17 NOTE — Telephone Encounter (Signed)
 Called [name]. She reports that they were able to get issue resolved.   Patient will be due to pick up medication on Saturday.   Adam JAYSON English, RN

## 2024-03-18 NOTE — Telephone Encounter (Signed)
 Chiquita or Page Can you call the pharmacy and see if they do indeed need a new prior auth and if so can they send a form? He has been getting this med so not sure why they would now need a new PA  I am confused THANKS! [name]

## 2024-03-18 NOTE — Telephone Encounter (Signed)
 Called pharmacy and verified that no PA is needed.   Too early to fill. Earliest fill date is tomorrow, 12/13.  [name] English, RN

## 2024-03-21 ENCOUNTER — Ambulatory Visit: Attending: Cardiovascular Disease

## 2024-03-21 DIAGNOSIS — Z95810 Presence of automatic (implantable) cardiac defibrillator: Secondary | ICD-10-CM

## 2024-03-21 DIAGNOSIS — I5022 Chronic systolic (congestive) heart failure: Secondary | ICD-10-CM

## 2024-03-25 ENCOUNTER — Other Ambulatory Visit: Payer: Self-pay | Admitting: Family Medicine

## 2024-03-25 NOTE — Progress Notes (Signed)
 EPIC Encounter for ICM Monitoring  Patient Name: [name] is a 63 y.o. male Date: 03/25/2024 Primary Care Physican: Rosalynn Camie LITTIE, MD Primary Cardiologist: Levern  Electrophysiologist: Mealor Nephrologist: [facility] 04/22/2023 Office Weight: 322 lbs 07/30/2023 Weight: 311 lbs   10/21/2023 Weight: 298 lbs   01/07/2024 Weight: Not weighing at home   Transmission results reviewed.     Since 02/08/2024 ICM Remote Transmission: Optivol thoracic impedance suggesting normal fluid levels with the exception of possible fluid accumulation from 02/15/2024-02/24/2024.   Prescribed:  Furosemide  40 mg take 1 tablet(s) (40 mg total) by mouth as directed.    Labs: 08/27/2023 Creatinine 1.67, BUN 18, Potassium 4.1, Sodium 146, GFR 45 07/14/2023 Creatinine 1.40, BUN 19, Potassium 3.2, Sodium 137, GFR 56  07/10/2023 Creatinine 1.65, BUN 19, Potassium 3.2, Sodium 143, GFR 46  07/09/2023 Creatinine 1.66, BUN 20, Potassium 3.2, Sodium 141, GFR 46  06/23/2023 Creatinine 1.54, BUN 12, Potassium 4.0, Sodium 142, GFR 50 04/22/2023 Creatinine 1.72, BUN 19, Potassium 4.5, Sodium 145, GFR 44 A complete set of results can be found in Results Review.   Recommendations:   No changes.   Follow-up plan: ICM clinic phone appointment on 05/02/2024.   91 day device clinic remote transmission 05/27/2024.     EP/Cardiology Office Visits:  Recall 08/26/2024 with Charlies Arthur, PA.   Copy of ICM check sent to Dr. Nancey.     Remote monitoring is medically necessary for Heart Failure Management.    Daily Thoracic Impedance ICM trend: 12/21/2023 through 03/21/2024.    12-14 Month Thoracic Impedance ICM trend:     Mitzie GORMAN Garner, RN 03/25/2024 3:08 PM

## 2024-04-09 ENCOUNTER — Other Ambulatory Visit: Payer: Self-pay | Admitting: Family Medicine

## 2024-04-12 ENCOUNTER — Telehealth: Payer: Self-pay

## 2024-04-12 NOTE — Telephone Encounter (Signed)
 [name] leaves VM on nurse line in regards to lantus  prescription.   She reports his copay is now 12.00 at the pharmacy. She reports he can not afford this. She reports this prescription had a 0 dollar copay last year.   She is requesting another insulin  with a 0 dollar copay.  Will forward to pharmacy team for assistance.

## 2024-04-13 NOTE — Telephone Encounter (Signed)
 [name] LVM on nurse line requesting update on concern.   [name] English, RN

## 2024-04-14 ENCOUNTER — Other Ambulatory Visit: Payer: Self-pay

## 2024-04-14 DIAGNOSIS — M170 Bilateral primary osteoarthritis of knee: Secondary | ICD-10-CM

## 2024-04-14 DIAGNOSIS — G8929 Other chronic pain: Secondary | ICD-10-CM

## 2024-04-14 NOTE — Telephone Encounter (Signed)
 Called pharmacy. Pharmacist reports that cost was $12, as the quantity was for entire box (approx 50 days). States that if patient would like to pick up 30 day supply for next refill cost would be less. Verified that they are able to break box.  He is unable to tell me how much at this time, as insurance is flagging too soon to fill. Pharmacist also states that once patient meets deductible, copay will likely go back to $0.  Patient did go ahead and pay $12 for prescription on 04/12/24.   Called [name] and advised of update. [name] reports that patient has issues with transportation, so they will keep things the way they are. She does not want to change quantities or pharmacies.   She was appreciative of us  looking into this for patient.   [name] English, RN

## 2024-04-15 MED ORDER — HYDROCODONE-ACETAMINOPHEN 5-325 MG PO TABS: ORAL_TABLET | ORAL | 0 refills | Status: AC

## 2024-05-02 ENCOUNTER — Ambulatory Visit

## 2024-05-02 DIAGNOSIS — I5022 Chronic systolic (congestive) heart failure: Secondary | ICD-10-CM | POA: Diagnosis not present

## 2024-05-02 DIAGNOSIS — Z95810 Presence of automatic (implantable) cardiac defibrillator: Secondary | ICD-10-CM

## 2024-05-02 NOTE — Progress Notes (Signed)
 EPIC Encounter for ICM Monitoring  Patient Name: [name] is a 64 y.o. male Date: 05/02/2024 Primary Care Physican: Rosalynn Camie LITTIE, MD Primary Cardiologist: Levern  Electrophysiologist: Mealor Nephrologist: [facility] 04/22/2023 Office Weight: 322 lbs 07/30/2023 Weight: 311 lbs   10/21/2023 Weight: 298 lbs   01/07/2024 Weight: Not weighing at home   Spoke with patient and heart failure questions reviewed.  Transmission results reviewed.  Pt asymptomatic for fluid accumulation.  Reports feeling well at this time and voices no complaints.   He admits to eating foods high in salt such as potato chips.   Since 03/21/2024 ICM Remote Transmission: Optivol thoracic impedance suggesting normal fluid levels with the exception of possible fluid accumulation starting 04/23/2024.   Prescribed:  Furosemide  40 mg take 1 tablet(s) (40 mg total) by mouth as directed.    Labs: 02/17/2024 Creatinine 1.55, BUN 9,   Potassium 3.1, Sodium 143, GFR 49 08/27/2023 Creatinine 1.67, BUN 18, Potassium 4.1, Sodium 146, GFR 45 07/14/2023 Creatinine 1.40, BUN 19, Potassium 3.2, Sodium 137, GFR 56  07/10/2023 Creatinine 1.65, BUN 19, Potassium 3.2, Sodium 143, GFR 46  07/09/2023 Creatinine 1.66, BUN 20, Potassium 3.2, Sodium 141, GFR 46  06/23/2023 Creatinine 1.54, BUN 12, Potassium 4.0, Sodium 142, GFR 50 04/22/2023 Creatinine 1.72, BUN 19, Potassium 4.5, Sodium 145, GFR 44 A complete set of results can be found in Results Review.   Recommendations:  Recommendation to limit salt intake to 2000 mg daily and fluid intake to 64 oz daily.  Encouraged to call if experiencing any fluid symptoms.  Copy sent to Dr Levern.     Follow-up plan: ICM clinic phone appointment on 05/10/2024 to recheck fluid levels.  Next 31 day ICM remote transmission scheduled for 05/30/2024.   91 day device clinic remote transmission 05/27/2024.     EP/Cardiology Office Visits:  Recall 08/26/2024 with Charlies Arthur, PA.   Copy of ICM  check sent to Dr. Nancey.     Remote monitoring is medically necessary for Heart Failure Management.    Daily Thoracic Impedance ICM trend: 02/01/2024 through 05/02/2024.    12-14 Month Thoracic Impedance ICM trend:     Mitzie GORMAN Garner, RN 05/02/2024 12:37 PM

## 2024-05-04 NOTE — Progress Notes (Signed)
 ICM clinic 31 day follow up currently on hold but 91 day remote monitoring will continue.  05/10/2024 ICM Remote transmission canceled.

## 2024-05-05 NOTE — Progress Notes (Signed)
 31 day ICM Remote transmission canceled due to [facility] clinic is on hold until further notice.  91 day remote monitoring will continue per protocol.

## 2024-05-10 ENCOUNTER — Ambulatory Visit

## 2024-05-27 ENCOUNTER — Ambulatory Visit

## 2024-06-02 ENCOUNTER — Ambulatory Visit

## 2024-08-26 ENCOUNTER — Ambulatory Visit

## 2024-11-25 ENCOUNTER — Ambulatory Visit

## 2024-11-28 ENCOUNTER — Encounter

## 2025-02-24 ENCOUNTER — Ambulatory Visit

## 2025-05-26 ENCOUNTER — Ambulatory Visit
